# Patient Record
Sex: Female | Born: 1945
Health system: Southern US, Community
[De-identification: ages and names within clinical notes are randomized; demographics above are authoritative.]

## PROBLEM LIST (undated history)

## (undated) ENCOUNTER — Emergency Department (HOSPITAL_COMMUNITY): Discharge status: Against Medical Advice | Payer: Medicare Other

## (undated) DIAGNOSIS — Z8719 Personal history of other diseases of the digestive system: Secondary | ICD-10-CM

## (undated) DIAGNOSIS — G4730 Sleep apnea, unspecified: Secondary | ICD-10-CM

## (undated) DIAGNOSIS — M792 Neuralgia and neuritis, unspecified: Secondary | ICD-10-CM

## (undated) DIAGNOSIS — Z9289 Personal history of other medical treatment: Secondary | ICD-10-CM

## (undated) DIAGNOSIS — R51 Headache: Secondary | ICD-10-CM

## (undated) DIAGNOSIS — F32A Depression, unspecified: Secondary | ICD-10-CM

## (undated) DIAGNOSIS — N1831 Chronic kidney disease, stage 3a: Secondary | ICD-10-CM

## (undated) DIAGNOSIS — F419 Anxiety disorder, unspecified: Secondary | ICD-10-CM

## (undated) DIAGNOSIS — F329 Major depressive disorder, single episode, unspecified: Secondary | ICD-10-CM

## (undated) DIAGNOSIS — Z87442 Personal history of urinary calculi: Secondary | ICD-10-CM

## (undated) DIAGNOSIS — K219 Gastro-esophageal reflux disease without esophagitis: Secondary | ICD-10-CM

## (undated) DIAGNOSIS — N200 Calculus of kidney: Secondary | ICD-10-CM

## (undated) DIAGNOSIS — M1990 Unspecified osteoarthritis, unspecified site: Secondary | ICD-10-CM

## (undated) DIAGNOSIS — D649 Anemia, unspecified: Secondary | ICD-10-CM

## (undated) DIAGNOSIS — Z9889 Other specified postprocedural states: Secondary | ICD-10-CM

## (undated) DIAGNOSIS — F429 Obsessive-compulsive disorder, unspecified: Secondary | ICD-10-CM

## (undated) DIAGNOSIS — I4891 Unspecified atrial fibrillation: Secondary | ICD-10-CM

## (undated) DIAGNOSIS — R0602 Shortness of breath: Secondary | ICD-10-CM

## (undated) DIAGNOSIS — E610 Copper deficiency: Principal | ICD-10-CM

## (undated) DIAGNOSIS — E039 Hypothyroidism, unspecified: Secondary | ICD-10-CM

## (undated) DIAGNOSIS — N189 Chronic kidney disease, unspecified: Secondary | ICD-10-CM

## (undated) DIAGNOSIS — J069 Acute upper respiratory infection, unspecified: Secondary | ICD-10-CM

## (undated) DIAGNOSIS — R112 Nausea with vomiting, unspecified: Secondary | ICD-10-CM

## (undated) DIAGNOSIS — E079 Disorder of thyroid, unspecified: Secondary | ICD-10-CM

## (undated) DIAGNOSIS — I4819 Other persistent atrial fibrillation: Secondary | ICD-10-CM

## (undated) DIAGNOSIS — M549 Dorsalgia, unspecified: Secondary | ICD-10-CM

## (undated) DIAGNOSIS — G8929 Other chronic pain: Secondary | ICD-10-CM

## (undated) DIAGNOSIS — K449 Diaphragmatic hernia without obstruction or gangrene: Secondary | ICD-10-CM

## (undated) DIAGNOSIS — I340 Nonrheumatic mitral (valve) insufficiency: Secondary | ICD-10-CM

## (undated) DIAGNOSIS — Z5189 Encounter for other specified aftercare: Secondary | ICD-10-CM

## (undated) HISTORY — PX: TONSILLECTOMY: SUR1361

## (undated) HISTORY — DX: Disorder of thyroid, unspecified: E07.9

## (undated) HISTORY — DX: Copper deficiency: E61.0

## (undated) HISTORY — DX: Other persistent atrial fibrillation: I48.19

## (undated) HISTORY — PX: ABDOMINAL HYSTERECTOMY: SHX81

## (undated) HISTORY — DX: Chronic kidney disease, stage 3a: N18.31

## (undated) HISTORY — PX: PERIPHERALLY INSERTED CENTRAL CATHETER INSERTION: SHX2221

## (undated) HISTORY — DX: Major depressive disorder, single episode, unspecified: F32.9

## (undated) HISTORY — DX: Gastro-esophageal reflux disease without esophagitis: K21.9

## (undated) HISTORY — DX: Unspecified atrial fibrillation: I48.91

## (undated) HISTORY — DX: Depression, unspecified: F32.A

## (undated) HISTORY — PX: APPENDECTOMY: SHX54

## (undated) HISTORY — PX: EYE SURGERY: SHX253

## (undated) HISTORY — PX: JOINT REPLACEMENT: SHX530

## (undated) HISTORY — DX: Obsessive-compulsive disorder, unspecified: F42.9

## (undated) HISTORY — DX: Morbid (severe) obesity due to excess calories: E66.01

## (undated) HISTORY — DX: Chronic kidney disease, unspecified: N18.9

## (undated) HISTORY — DX: Nonrheumatic mitral (valve) insufficiency: I34.0

## (undated) HISTORY — PX: PARATHYROIDECTOMY: SHX19

## (undated) HISTORY — DX: Anxiety disorder, unspecified: F41.9

## (undated) HISTORY — PX: CHOLECYSTECTOMY: SHX55

---

## 1998-01-21 ENCOUNTER — Other Ambulatory Visit: Admission: RE | Admit: 1998-01-21 | Discharge: 1998-01-21 | Payer: Self-pay | Admitting: Obstetrics and Gynecology

## 2001-09-12 ENCOUNTER — Encounter: Payer: Self-pay | Admitting: Cardiology

## 2001-09-12 ENCOUNTER — Inpatient Hospital Stay (HOSPITAL_COMMUNITY): Admission: EM | Admit: 2001-09-12 | Discharge: 2001-09-14 | Payer: Self-pay | Admitting: *Deleted

## 2001-09-12 ENCOUNTER — Encounter (INDEPENDENT_AMBULATORY_CARE_PROVIDER_SITE_OTHER): Payer: Self-pay | Admitting: Specialist

## 2001-10-24 ENCOUNTER — Encounter: Payer: Self-pay | Admitting: Internal Medicine

## 2001-10-24 ENCOUNTER — Ambulatory Visit (HOSPITAL_COMMUNITY): Admission: RE | Admit: 2001-10-24 | Discharge: 2001-10-24 | Payer: Self-pay | Admitting: Internal Medicine

## 2002-04-03 ENCOUNTER — Ambulatory Visit: Admission: RE | Admit: 2002-04-03 | Discharge: 2002-04-03 | Payer: Self-pay | Admitting: Family Medicine

## 2002-04-12 ENCOUNTER — Ambulatory Visit (HOSPITAL_COMMUNITY): Admission: RE | Admit: 2002-04-12 | Discharge: 2002-04-12 | Payer: Self-pay | Admitting: Internal Medicine

## 2003-01-08 ENCOUNTER — Ambulatory Visit: Admission: RE | Admit: 2003-01-08 | Discharge: 2003-01-08 | Payer: Self-pay | Admitting: Pulmonary Disease

## 2003-11-27 ENCOUNTER — Encounter: Admission: RE | Admit: 2003-11-27 | Discharge: 2003-11-27 | Payer: Self-pay | Admitting: Family Medicine

## 2004-01-24 ENCOUNTER — Ambulatory Visit: Payer: Self-pay | Admitting: Psychology

## 2004-02-03 ENCOUNTER — Ambulatory Visit: Payer: Self-pay | Admitting: Psychology

## 2004-02-13 ENCOUNTER — Ambulatory Visit (HOSPITAL_COMMUNITY): Admission: RE | Admit: 2004-02-13 | Discharge: 2004-02-13 | Payer: Self-pay | Admitting: Family Medicine

## 2004-03-09 ENCOUNTER — Ambulatory Visit: Payer: Self-pay | Admitting: Internal Medicine

## 2004-03-18 ENCOUNTER — Ambulatory Visit (HOSPITAL_COMMUNITY): Admission: RE | Admit: 2004-03-18 | Discharge: 2004-03-18 | Payer: Self-pay | Admitting: Internal Medicine

## 2004-03-18 ENCOUNTER — Ambulatory Visit: Payer: Self-pay | Admitting: Internal Medicine

## 2004-04-15 ENCOUNTER — Ambulatory Visit: Payer: Self-pay | Admitting: Internal Medicine

## 2004-04-27 ENCOUNTER — Ambulatory Visit: Payer: Self-pay | Admitting: *Deleted

## 2004-04-29 ENCOUNTER — Ambulatory Visit: Payer: Self-pay | Admitting: *Deleted

## 2004-04-29 ENCOUNTER — Encounter (HOSPITAL_COMMUNITY): Admission: RE | Admit: 2004-04-29 | Discharge: 2004-04-30 | Payer: Self-pay | Admitting: *Deleted

## 2004-05-14 ENCOUNTER — Ambulatory Visit (HOSPITAL_COMMUNITY): Admission: RE | Admit: 2004-05-14 | Discharge: 2004-05-14 | Payer: Self-pay | Admitting: Family Medicine

## 2004-05-27 ENCOUNTER — Ambulatory Visit: Payer: Self-pay | Admitting: *Deleted

## 2004-06-30 ENCOUNTER — Ambulatory Visit: Payer: Self-pay | Admitting: *Deleted

## 2004-08-04 ENCOUNTER — Ambulatory Visit (HOSPITAL_COMMUNITY): Admission: RE | Admit: 2004-08-04 | Discharge: 2004-08-04 | Payer: Self-pay | Admitting: Family Medicine

## 2004-08-11 ENCOUNTER — Ambulatory Visit: Payer: Self-pay | Admitting: *Deleted

## 2004-08-19 ENCOUNTER — Ambulatory Visit (HOSPITAL_COMMUNITY): Admission: RE | Admit: 2004-08-19 | Discharge: 2004-08-19 | Payer: Self-pay | Admitting: Family Medicine

## 2005-01-18 HISTORY — PX: GASTRIC BYPASS: SHX52

## 2005-03-08 ENCOUNTER — Ambulatory Visit (HOSPITAL_COMMUNITY): Admission: RE | Admit: 2005-03-08 | Discharge: 2005-03-08 | Payer: Self-pay | Admitting: Family Medicine

## 2005-05-12 ENCOUNTER — Ambulatory Visit (HOSPITAL_COMMUNITY): Admission: RE | Admit: 2005-05-12 | Discharge: 2005-05-12 | Payer: Self-pay | Admitting: Family Medicine

## 2005-08-05 ENCOUNTER — Ambulatory Visit: Payer: Self-pay | Admitting: Orthopedic Surgery

## 2005-09-14 ENCOUNTER — Ambulatory Visit (HOSPITAL_COMMUNITY): Admission: RE | Admit: 2005-09-14 | Discharge: 2005-09-14 | Payer: Self-pay | Admitting: Family Medicine

## 2005-10-05 ENCOUNTER — Emergency Department (HOSPITAL_COMMUNITY): Admission: EM | Admit: 2005-10-05 | Discharge: 2005-10-05 | Payer: Self-pay | Admitting: Emergency Medicine

## 2005-10-11 ENCOUNTER — Ambulatory Visit: Payer: Self-pay | Admitting: Internal Medicine

## 2005-12-14 ENCOUNTER — Ambulatory Visit (HOSPITAL_COMMUNITY): Payer: Self-pay | Admitting: Psychology

## 2005-12-24 ENCOUNTER — Ambulatory Visit (HOSPITAL_COMMUNITY): Payer: Self-pay | Admitting: Psychology

## 2006-01-07 ENCOUNTER — Ambulatory Visit (HOSPITAL_COMMUNITY): Payer: Self-pay | Admitting: Psychology

## 2006-01-18 HISTORY — PX: GASTRIC BYPASS: SHX52

## 2006-01-24 ENCOUNTER — Ambulatory Visit (HOSPITAL_COMMUNITY): Payer: Self-pay | Admitting: Psychology

## 2006-02-02 ENCOUNTER — Ambulatory Visit (HOSPITAL_COMMUNITY): Admission: RE | Admit: 2006-02-02 | Discharge: 2006-02-02 | Payer: Self-pay | Admitting: Family Medicine

## 2006-02-10 ENCOUNTER — Ambulatory Visit (HOSPITAL_COMMUNITY): Payer: Self-pay | Admitting: Psychology

## 2006-02-24 ENCOUNTER — Ambulatory Visit (HOSPITAL_COMMUNITY): Payer: Self-pay | Admitting: Psychology

## 2006-02-28 ENCOUNTER — Ambulatory Visit (HOSPITAL_COMMUNITY): Admission: RE | Admit: 2006-02-28 | Discharge: 2006-02-28 | Payer: Self-pay | Admitting: Orthopedic Surgery

## 2006-03-10 ENCOUNTER — Ambulatory Visit (HOSPITAL_COMMUNITY): Payer: Self-pay | Admitting: Psychology

## 2006-03-25 ENCOUNTER — Ambulatory Visit (HOSPITAL_COMMUNITY): Payer: Self-pay | Admitting: Psychology

## 2006-04-08 ENCOUNTER — Ambulatory Visit (HOSPITAL_COMMUNITY): Payer: Self-pay | Admitting: Psychology

## 2006-04-25 ENCOUNTER — Ambulatory Visit (HOSPITAL_COMMUNITY): Payer: Self-pay | Admitting: Psychology

## 2006-05-09 ENCOUNTER — Ambulatory Visit (HOSPITAL_COMMUNITY): Payer: Self-pay | Admitting: Psychology

## 2006-05-25 ENCOUNTER — Ambulatory Visit (HOSPITAL_COMMUNITY): Payer: Self-pay | Admitting: Psychology

## 2006-06-08 ENCOUNTER — Ambulatory Visit (HOSPITAL_COMMUNITY): Payer: Self-pay | Admitting: Psychology

## 2006-07-08 ENCOUNTER — Ambulatory Visit (HOSPITAL_COMMUNITY): Payer: Self-pay | Admitting: Psychology

## 2006-08-05 ENCOUNTER — Ambulatory Visit (HOSPITAL_COMMUNITY): Payer: Self-pay | Admitting: Psychology

## 2006-09-05 ENCOUNTER — Ambulatory Visit (HOSPITAL_COMMUNITY): Payer: Self-pay | Admitting: Psychology

## 2006-11-30 ENCOUNTER — Ambulatory Visit (HOSPITAL_COMMUNITY): Payer: Self-pay | Admitting: Psychology

## 2007-04-20 ENCOUNTER — Encounter (HOSPITAL_COMMUNITY): Admission: RE | Admit: 2007-04-20 | Discharge: 2007-05-20 | Payer: Self-pay | Admitting: Family Medicine

## 2007-06-22 ENCOUNTER — Ambulatory Visit: Payer: Self-pay | Admitting: Internal Medicine

## 2007-06-23 ENCOUNTER — Ambulatory Visit (HOSPITAL_COMMUNITY): Admission: RE | Admit: 2007-06-23 | Discharge: 2007-06-23 | Payer: Self-pay | Admitting: Internal Medicine

## 2007-06-29 ENCOUNTER — Ambulatory Visit: Admission: RE | Admit: 2007-06-29 | Discharge: 2007-06-29 | Payer: Self-pay | Admitting: Family Medicine

## 2007-12-26 ENCOUNTER — Inpatient Hospital Stay (HOSPITAL_COMMUNITY): Admission: AD | Admit: 2007-12-26 | Discharge: 2008-01-01 | Payer: Self-pay | Admitting: Family Medicine

## 2008-02-29 ENCOUNTER — Ambulatory Visit (HOSPITAL_COMMUNITY): Admission: RE | Admit: 2008-02-29 | Discharge: 2008-02-29 | Payer: Self-pay | Admitting: Internal Medicine

## 2008-02-29 ENCOUNTER — Encounter: Payer: Self-pay | Admitting: Urgent Care

## 2008-02-29 ENCOUNTER — Ambulatory Visit: Payer: Self-pay | Admitting: Internal Medicine

## 2008-02-29 LAB — CONVERTED CEMR LAB
ALT: 11 units/L (ref 0–35)
AST: 16 units/L (ref 0–37)
Alkaline Phosphatase: 72 units/L (ref 39–117)
Basophils Absolute: 0 10*3/uL (ref 0.0–0.1)
Basophils Relative: 0 % (ref 0–1)
Bilirubin, Direct: 0.1 mg/dL (ref 0.0–0.3)
CO2: 30 meq/L (ref 19–32)
Chloride: 101 meq/L (ref 96–112)
Hemoglobin: 12.4 g/dL (ref 12.0–15.0)
Indirect Bilirubin: 0.2 mg/dL (ref 0.0–0.9)
Lymphocytes Relative: 30 % (ref 12–46)
MCHC: 33.6 g/dL (ref 30.0–36.0)
Monocytes Absolute: 0.6 10*3/uL (ref 0.1–1.0)
Neutro Abs: 5.2 10*3/uL (ref 1.7–7.7)
Neutrophils Relative %: 60 % (ref 43–77)
Potassium: 4.6 meq/L (ref 3.5–5.3)
RDW: 14.8 % (ref 11.5–15.5)
Sodium: 139 meq/L (ref 135–145)
Total Protein: 6.3 g/dL (ref 6.0–8.3)

## 2008-03-06 ENCOUNTER — Telehealth: Payer: Self-pay | Admitting: Urgent Care

## 2008-03-06 ENCOUNTER — Ambulatory Visit: Payer: Self-pay | Admitting: Internal Medicine

## 2008-05-28 ENCOUNTER — Ambulatory Visit (HOSPITAL_COMMUNITY): Admission: RE | Admit: 2008-05-28 | Discharge: 2008-05-28 | Payer: Self-pay | Admitting: Family Medicine

## 2008-06-06 ENCOUNTER — Encounter (INDEPENDENT_AMBULATORY_CARE_PROVIDER_SITE_OTHER): Payer: Self-pay | Admitting: General Surgery

## 2008-06-06 ENCOUNTER — Ambulatory Visit (HOSPITAL_COMMUNITY): Admission: RE | Admit: 2008-06-06 | Discharge: 2008-06-07 | Payer: Self-pay | Admitting: General Surgery

## 2008-07-25 ENCOUNTER — Emergency Department (HOSPITAL_COMMUNITY): Admission: EM | Admit: 2008-07-25 | Discharge: 2008-07-26 | Payer: Self-pay | Admitting: Emergency Medicine

## 2008-08-15 ENCOUNTER — Ambulatory Visit (HOSPITAL_COMMUNITY): Admission: RE | Admit: 2008-08-15 | Discharge: 2008-08-15 | Payer: Self-pay | Admitting: Urology

## 2008-08-21 ENCOUNTER — Encounter: Payer: Self-pay | Admitting: Gastroenterology

## 2008-08-29 ENCOUNTER — Ambulatory Visit (HOSPITAL_COMMUNITY): Payer: Self-pay | Admitting: Psychology

## 2008-09-27 ENCOUNTER — Ambulatory Visit (HOSPITAL_COMMUNITY): Payer: Self-pay | Admitting: Psychology

## 2008-11-06 ENCOUNTER — Ambulatory Visit (HOSPITAL_COMMUNITY): Payer: Self-pay | Admitting: Psychology

## 2009-01-16 ENCOUNTER — Ambulatory Visit (HOSPITAL_COMMUNITY): Admission: RE | Admit: 2009-01-16 | Discharge: 2009-01-16 | Payer: Self-pay | Admitting: Family Medicine

## 2009-01-16 ENCOUNTER — Encounter (INDEPENDENT_AMBULATORY_CARE_PROVIDER_SITE_OTHER): Payer: Self-pay | Admitting: *Deleted

## 2009-01-16 LAB — CONVERTED CEMR LAB
Basophils Absolute: 0 10*3/uL
Basophils Relative: 1 %
Eosinophils Absolute: 0.2 10*3/uL
Eosinophils Relative: 3 %
Lymphocytes Relative: 42 %
Lymphs Abs: 2.5 10*3/uL
MCV: 82.5 fL
Neutro Abs: 2.8 10*3/uL
RBC: 4.18 M/uL
WBC: 5.8 10*3/uL

## 2009-02-01 ENCOUNTER — Inpatient Hospital Stay (HOSPITAL_COMMUNITY): Admission: EM | Admit: 2009-02-01 | Discharge: 2009-02-06 | Payer: Self-pay | Admitting: Emergency Medicine

## 2009-02-02 ENCOUNTER — Ambulatory Visit: Payer: Self-pay | Admitting: Internal Medicine

## 2009-02-03 ENCOUNTER — Ambulatory Visit: Payer: Self-pay | Admitting: Internal Medicine

## 2009-02-04 ENCOUNTER — Ambulatory Visit: Payer: Self-pay | Admitting: Internal Medicine

## 2009-02-05 ENCOUNTER — Ambulatory Visit: Payer: Self-pay | Admitting: Internal Medicine

## 2009-03-10 ENCOUNTER — Ambulatory Visit (HOSPITAL_COMMUNITY): Admission: RE | Admit: 2009-03-10 | Discharge: 2009-03-10 | Payer: Self-pay | Admitting: Urology

## 2009-03-19 DIAGNOSIS — D351 Benign neoplasm of parathyroid gland: Secondary | ICD-10-CM | POA: Insufficient documentation

## 2009-03-19 DIAGNOSIS — F341 Dysthymic disorder: Secondary | ICD-10-CM | POA: Insufficient documentation

## 2009-03-19 DIAGNOSIS — E7800 Pure hypercholesterolemia, unspecified: Secondary | ICD-10-CM | POA: Insufficient documentation

## 2009-03-19 DIAGNOSIS — M1990 Unspecified osteoarthritis, unspecified site: Secondary | ICD-10-CM | POA: Insufficient documentation

## 2009-03-19 DIAGNOSIS — E538 Deficiency of other specified B group vitamins: Secondary | ICD-10-CM | POA: Insufficient documentation

## 2009-03-19 DIAGNOSIS — I10 Essential (primary) hypertension: Secondary | ICD-10-CM | POA: Insufficient documentation

## 2009-03-19 DIAGNOSIS — T8149XA Infection following a procedure, other surgical site, initial encounter: Secondary | ICD-10-CM

## 2009-03-19 DIAGNOSIS — K219 Gastro-esophageal reflux disease without esophagitis: Secondary | ICD-10-CM | POA: Insufficient documentation

## 2009-03-20 ENCOUNTER — Ambulatory Visit: Payer: Self-pay | Admitting: Internal Medicine

## 2009-03-20 ENCOUNTER — Encounter (INDEPENDENT_AMBULATORY_CARE_PROVIDER_SITE_OTHER): Payer: Self-pay

## 2009-03-20 DIAGNOSIS — Z8711 Personal history of peptic ulcer disease: Secondary | ICD-10-CM

## 2009-03-25 ENCOUNTER — Encounter: Payer: Self-pay | Admitting: Internal Medicine

## 2009-03-25 LAB — CONVERTED CEMR LAB
ALT: <8 units/L (ref 0–35)
Alkaline Phosphatase: 68 units/L (ref 39–117)
Basophils Absolute: 0 10*3/uL (ref 0.0–0.1)
Basophils Relative: 1 % (ref 0–1)
CO2: 23 meq/L (ref 19–32)
Creatinine, Ser: 0.85 mg/dL (ref 0.40–1.20)
Eosinophils Absolute: 0.1 10*3/uL (ref 0.0–0.7)
Glucose, Bld: 159 mg/dL — ABNORMAL HIGH (ref 70–99)
Lipase: <10 units/L (ref 0–75)
MCHC: 30.8 g/dL (ref 30.0–36.0)
MCV: 87.6 fL (ref 78.0–100.0)
Monocytes Relative: 6 % (ref 3–12)
Neutro Abs: 2.8 10*3/uL (ref 1.7–7.7)
Neutrophils Relative %: 47 % (ref 43–77)
RBC: 3.56 M/uL — ABNORMAL LOW (ref 3.87–5.11)
RDW: 14.7 % (ref 11.5–15.5)
Total Bilirubin: 0.6 mg/dL (ref 0.3–1.2)

## 2009-03-26 ENCOUNTER — Encounter: Payer: Self-pay | Admitting: Internal Medicine

## 2009-03-26 LAB — CONVERTED CEMR LAB
Iron: 21 ug/dL — ABNORMAL LOW (ref 42–145)
Saturation Ratios: 5 % — ABNORMAL LOW (ref 20–55)
TIBC: 382 ug/dL (ref 250–470)
Vitamin B-12: 330 pg/mL (ref 211–911)

## 2009-03-27 ENCOUNTER — Ambulatory Visit: Payer: Self-pay | Admitting: Internal Medicine

## 2009-03-31 ENCOUNTER — Encounter: Payer: Self-pay | Admitting: Internal Medicine

## 2009-04-07 ENCOUNTER — Ambulatory Visit (HOSPITAL_COMMUNITY): Admission: RE | Admit: 2009-04-07 | Discharge: 2009-04-07 | Payer: Self-pay | Admitting: Internal Medicine

## 2009-04-07 ENCOUNTER — Ambulatory Visit: Payer: Self-pay | Admitting: Internal Medicine

## 2009-04-09 ENCOUNTER — Encounter: Payer: Self-pay | Admitting: Internal Medicine

## 2009-05-07 ENCOUNTER — Encounter (INDEPENDENT_AMBULATORY_CARE_PROVIDER_SITE_OTHER): Payer: Self-pay

## 2009-05-19 ENCOUNTER — Encounter: Payer: Self-pay | Admitting: Urgent Care

## 2009-05-20 ENCOUNTER — Ambulatory Visit: Payer: Self-pay | Admitting: Internal Medicine

## 2009-05-20 DIAGNOSIS — R109 Unspecified abdominal pain: Secondary | ICD-10-CM

## 2009-05-20 DIAGNOSIS — R1084 Generalized abdominal pain: Secondary | ICD-10-CM | POA: Insufficient documentation

## 2009-06-04 ENCOUNTER — Encounter (INDEPENDENT_AMBULATORY_CARE_PROVIDER_SITE_OTHER): Payer: Self-pay

## 2009-06-04 ENCOUNTER — Ambulatory Visit: Payer: Self-pay | Admitting: Internal Medicine

## 2009-06-04 ENCOUNTER — Ambulatory Visit (HOSPITAL_COMMUNITY): Admission: RE | Admit: 2009-06-04 | Discharge: 2009-06-04 | Payer: Self-pay | Admitting: Internal Medicine

## 2009-06-06 ENCOUNTER — Encounter: Payer: Self-pay | Admitting: Gastroenterology

## 2009-06-06 ENCOUNTER — Encounter: Payer: Self-pay | Admitting: Internal Medicine

## 2009-06-06 ENCOUNTER — Ambulatory Visit (HOSPITAL_COMMUNITY): Admission: RE | Admit: 2009-06-06 | Discharge: 2009-06-06 | Payer: Self-pay | Admitting: Internal Medicine

## 2009-06-06 LAB — CONVERTED CEMR LAB
Basophils Absolute: 0.1 10*3/uL (ref 0.0–0.1)
Basophils Relative: 1 % (ref 0–1)
Eosinophils Absolute: 0.1 10*3/uL (ref 0.0–0.7)
Eosinophils Relative: 3 % (ref 0–5)
HCT: 34 % — ABNORMAL LOW (ref 36.0–46.0)
MCV: 83.5 fL (ref 78.0–100.0)
Platelets: 201 10*3/uL (ref 150–400)
RDW: 18.6 % — ABNORMAL HIGH (ref 11.5–15.5)

## 2009-06-10 ENCOUNTER — Encounter: Payer: Self-pay | Admitting: Internal Medicine

## 2009-06-12 ENCOUNTER — Ambulatory Visit (HOSPITAL_COMMUNITY): Admission: RE | Admit: 2009-06-12 | Discharge: 2009-06-12 | Payer: Self-pay | Admitting: Internal Medicine

## 2009-06-17 ENCOUNTER — Ambulatory Visit (HOSPITAL_COMMUNITY): Admission: RE | Admit: 2009-06-17 | Discharge: 2009-06-17 | Payer: Self-pay | Admitting: Urology

## 2009-06-19 ENCOUNTER — Ambulatory Visit: Payer: Self-pay | Admitting: Internal Medicine

## 2009-06-23 ENCOUNTER — Encounter: Payer: Self-pay | Admitting: Internal Medicine

## 2009-06-24 ENCOUNTER — Ambulatory Visit (HOSPITAL_COMMUNITY): Admission: RE | Admit: 2009-06-24 | Discharge: 2009-06-24 | Payer: Self-pay | Admitting: Urology

## 2009-06-25 ENCOUNTER — Emergency Department (HOSPITAL_COMMUNITY): Admission: EM | Admit: 2009-06-25 | Discharge: 2009-06-25 | Payer: Self-pay | Admitting: Emergency Medicine

## 2009-06-26 ENCOUNTER — Ambulatory Visit (HOSPITAL_COMMUNITY): Admission: RE | Admit: 2009-06-26 | Discharge: 2009-06-26 | Payer: Self-pay | Admitting: Internal Medicine

## 2009-06-27 ENCOUNTER — Ambulatory Visit (HOSPITAL_COMMUNITY): Admission: RE | Admit: 2009-06-27 | Discharge: 2009-06-27 | Payer: Self-pay | Admitting: Internal Medicine

## 2009-06-27 ENCOUNTER — Encounter: Payer: Self-pay | Admitting: Internal Medicine

## 2009-07-02 ENCOUNTER — Ambulatory Visit (HOSPITAL_COMMUNITY): Admission: RE | Admit: 2009-07-02 | Discharge: 2009-07-02 | Payer: Self-pay | Admitting: Internal Medicine

## 2009-07-04 ENCOUNTER — Encounter (INDEPENDENT_AMBULATORY_CARE_PROVIDER_SITE_OTHER): Payer: Self-pay

## 2009-07-09 ENCOUNTER — Telehealth (INDEPENDENT_AMBULATORY_CARE_PROVIDER_SITE_OTHER): Payer: Self-pay

## 2009-07-24 LAB — CONVERTED CEMR LAB
Basophils Absolute: 0 10*3/uL (ref 0.0–0.1)
Basophils Relative: 1 % (ref 0–1)
MCHC: 31.1 g/dL (ref 30.0–36.0)
Monocytes Absolute: 0.5 10*3/uL (ref 0.1–1.0)
Neutro Abs: 3 10*3/uL (ref 1.7–7.7)
Neutrophils Relative %: 48 % (ref 43–77)
Platelets: 172 10*3/uL (ref 150–400)
RDW: 18.7 % — ABNORMAL HIGH (ref 11.5–15.5)

## 2009-08-11 ENCOUNTER — Ambulatory Visit: Payer: Self-pay | Admitting: Gastroenterology

## 2009-08-11 DIAGNOSIS — K921 Melena: Secondary | ICD-10-CM | POA: Insufficient documentation

## 2009-08-12 ENCOUNTER — Encounter (INDEPENDENT_AMBULATORY_CARE_PROVIDER_SITE_OTHER): Payer: Self-pay

## 2009-08-12 ENCOUNTER — Encounter: Payer: Self-pay | Admitting: Gastroenterology

## 2009-08-13 ENCOUNTER — Telehealth (INDEPENDENT_AMBULATORY_CARE_PROVIDER_SITE_OTHER): Payer: Self-pay

## 2009-08-15 ENCOUNTER — Telehealth (INDEPENDENT_AMBULATORY_CARE_PROVIDER_SITE_OTHER): Payer: Self-pay

## 2009-08-15 ENCOUNTER — Encounter: Payer: Self-pay | Admitting: Internal Medicine

## 2009-08-18 ENCOUNTER — Ambulatory Visit (HOSPITAL_COMMUNITY): Admission: RE | Admit: 2009-08-18 | Discharge: 2009-08-18 | Payer: Self-pay | Admitting: Internal Medicine

## 2009-08-19 ENCOUNTER — Ambulatory Visit (HOSPITAL_COMMUNITY): Admission: RE | Admit: 2009-08-19 | Discharge: 2009-08-19 | Payer: Self-pay | Admitting: Internal Medicine

## 2009-08-22 ENCOUNTER — Telehealth (INDEPENDENT_AMBULATORY_CARE_PROVIDER_SITE_OTHER): Payer: Self-pay

## 2009-08-22 LAB — CONVERTED CEMR LAB
Basophils Relative: 1 % (ref 0–1)
Eosinophils Absolute: 0.1 10*3/uL (ref 0.0–0.7)
Lymphs Abs: 2.3 10*3/uL (ref 0.7–4.0)
MCV: 86.3 fL (ref 78.0–100.0)
Neutrophils Relative %: 55 % (ref 43–77)
Platelets: 193 10*3/uL (ref 150–400)
WBC: 6.8 10*3/uL (ref 4.0–10.5)

## 2009-08-25 ENCOUNTER — Telehealth (INDEPENDENT_AMBULATORY_CARE_PROVIDER_SITE_OTHER): Payer: Self-pay

## 2009-09-08 ENCOUNTER — Encounter: Payer: Self-pay | Admitting: Internal Medicine

## 2009-09-10 ENCOUNTER — Telehealth (INDEPENDENT_AMBULATORY_CARE_PROVIDER_SITE_OTHER): Payer: Self-pay

## 2009-09-12 ENCOUNTER — Ambulatory Visit (HOSPITAL_COMMUNITY): Admission: RE | Admit: 2009-09-12 | Discharge: 2009-09-12 | Payer: Self-pay | Admitting: General Surgery

## 2009-10-03 ENCOUNTER — Telehealth (INDEPENDENT_AMBULATORY_CARE_PROVIDER_SITE_OTHER): Payer: Self-pay

## 2009-10-03 ENCOUNTER — Encounter (INDEPENDENT_AMBULATORY_CARE_PROVIDER_SITE_OTHER): Payer: Self-pay

## 2009-10-15 ENCOUNTER — Ambulatory Visit (HOSPITAL_COMMUNITY)
Admission: RE | Admit: 2009-10-15 | Discharge: 2009-10-15 | Payer: Self-pay | Source: Home / Self Care | Admitting: Internal Medicine

## 2009-10-21 ENCOUNTER — Telehealth (INDEPENDENT_AMBULATORY_CARE_PROVIDER_SITE_OTHER): Payer: Self-pay

## 2009-10-23 ENCOUNTER — Ambulatory Visit (HOSPITAL_COMMUNITY): Admission: RE | Admit: 2009-10-23 | Discharge: 2009-10-23 | Payer: Self-pay | Admitting: Urology

## 2009-10-23 ENCOUNTER — Ambulatory Visit (HOSPITAL_COMMUNITY)
Admission: RE | Admit: 2009-10-23 | Discharge: 2009-10-23 | Payer: Self-pay | Source: Home / Self Care | Admitting: Urology

## 2009-10-29 ENCOUNTER — Ambulatory Visit: Payer: Self-pay | Admitting: Internal Medicine

## 2009-11-14 ENCOUNTER — Encounter (INDEPENDENT_AMBULATORY_CARE_PROVIDER_SITE_OTHER): Payer: Self-pay | Admitting: *Deleted

## 2009-11-21 ENCOUNTER — Encounter: Payer: Self-pay | Admitting: Gastroenterology

## 2009-12-31 ENCOUNTER — Ambulatory Visit (HOSPITAL_COMMUNITY)
Admission: RE | Admit: 2009-12-31 | Discharge: 2009-12-31 | Payer: Self-pay | Source: Home / Self Care | Attending: Urology | Admitting: Urology

## 2010-01-05 ENCOUNTER — Ambulatory Visit (HOSPITAL_COMMUNITY)
Admission: RE | Admit: 2010-01-05 | Discharge: 2010-01-05 | Payer: Self-pay | Source: Home / Self Care | Attending: Urology | Admitting: Urology

## 2010-01-13 ENCOUNTER — Ambulatory Visit (HOSPITAL_COMMUNITY): Admission: RE | Admit: 2010-01-13 | Payer: Self-pay | Source: Home / Self Care | Admitting: Urology

## 2010-01-14 ENCOUNTER — Encounter (INDEPENDENT_AMBULATORY_CARE_PROVIDER_SITE_OTHER): Payer: Self-pay | Admitting: *Deleted

## 2010-01-16 ENCOUNTER — Inpatient Hospital Stay (HOSPITAL_COMMUNITY)
Admission: EM | Admit: 2010-01-16 | Discharge: 2010-01-17 | Payer: Self-pay | Source: Home / Self Care | Attending: Family Medicine | Admitting: Family Medicine

## 2010-01-22 ENCOUNTER — Ambulatory Visit (HOSPITAL_COMMUNITY)
Admission: RE | Admit: 2010-01-22 | Discharge: 2010-01-22 | Payer: Self-pay | Source: Home / Self Care | Attending: Urology | Admitting: Urology

## 2010-01-27 DIAGNOSIS — N1330 Unspecified hydronephrosis: Secondary | ICD-10-CM | POA: Insufficient documentation

## 2010-01-28 ENCOUNTER — Ambulatory Visit (HOSPITAL_COMMUNITY)
Admission: RE | Admit: 2010-01-28 | Discharge: 2010-01-28 | Payer: Self-pay | Source: Home / Self Care | Attending: Urology | Admitting: Urology

## 2010-01-29 ENCOUNTER — Ambulatory Visit (HOSPITAL_COMMUNITY)
Admission: RE | Admit: 2010-01-29 | Discharge: 2010-01-29 | Payer: Self-pay | Source: Home / Self Care | Attending: Urology | Admitting: Urology

## 2010-01-29 ENCOUNTER — Ambulatory Visit (HOSPITAL_COMMUNITY)
Admission: RE | Admit: 2010-01-29 | Discharge: 2010-01-29 | Payer: Self-pay | Source: Home / Self Care | Attending: Cardiology | Admitting: Cardiology

## 2010-02-03 ENCOUNTER — Telehealth (INDEPENDENT_AMBULATORY_CARE_PROVIDER_SITE_OTHER): Payer: Self-pay | Admitting: *Deleted

## 2010-02-05 ENCOUNTER — Ambulatory Visit (HOSPITAL_COMMUNITY)
Admission: RE | Admit: 2010-02-05 | Discharge: 2010-02-05 | Payer: Self-pay | Source: Home / Self Care | Attending: Urology | Admitting: Urology

## 2010-02-05 ENCOUNTER — Encounter (INDEPENDENT_AMBULATORY_CARE_PROVIDER_SITE_OTHER): Payer: Self-pay | Admitting: *Deleted

## 2010-02-08 ENCOUNTER — Encounter: Payer: Self-pay | Admitting: Orthopedic Surgery

## 2010-02-08 ENCOUNTER — Encounter: Payer: Self-pay | Admitting: Family Medicine

## 2010-02-09 ENCOUNTER — Encounter (INDEPENDENT_AMBULATORY_CARE_PROVIDER_SITE_OTHER): Payer: Self-pay | Admitting: *Deleted

## 2010-02-09 NOTE — H&P (Signed)
  NAMEEZME, [name]                  ACCOUNT NO.:  [id number]  MEDICAL RECORD NO.:  [id number]           PATIENT TYPE:  LOCATION:                                 FACILITY:  PHYSICIAN:  Dennie Maizes, M.D.   DATE OF BIRTH:  06-07-1945  DATE OF ADMISSION:  01/28/2010 DATE OF DISCHARGE:  LH                             HISTORY & PHYSICAL   CHIEF COMPLAINT:  Severe right flank pain, right upper ureteral calculus with obstruction, right hydronephrosis.  HISTORY OF PRESENT ILLNESS:  This 65 year old female has been evaluated for hematuria in the past.  She had bilateral renal calculi.  She had a 3 to 4-mm sized stone in the left renal pelvis.  The patient underwent ESWL of the stone about a month ago.  She has passed all the stone fragments.  She had a 5 to 6-mm sized stone in the right kidney at that time.  She has been having intermittent right flank pain at present. She was evaluated at [facility] with CT scan of the abdomen and pelvis.  This revealed a moderately severe right hydronephrosis due to the obstructing 5-mm stone in the ureteropelvic junction area.  The patient has not passed the stone.  She continues to have severe pain.  She is brought to the short-stay center today for extracorporeal shock wave lithotripsy of the obstructing right upper ureteral calculus.  The patient denies fever, chills, voiding difficulty, or gross hematuria at present.  PAST MEDICAL HISTORY:  Status post gastric bypass surgery, cholecystectomy, hysterectomy, appendectomy, knee replacement, depression, anxiety, hypothyroidism, right renal lithiasis, status post ESWL of left renal calculus in December 2011.  MEDICATIONS:  Alprazolam 1 mg p.o. t.i.d., hydromorphone 2 mg p.o. t.i.d., Levoxyl 25 mg 1 p.o. daily, levothyroxine 0.25 mg p.o. daily, citalopram 10 mg p.o. daily.  ALLERGIES:  He is allergic to codeine.  REVIEW OF SYSTEMS:  Positive for abdominal pain, nausea, flank  pain.  FAMILY HISTORY:  Unremarkable.  SOCIAL HISTORY:  The patient is a nonsmoker and she does not drink.  PHYSICAL EXAMINATION:  HEAD, EYES, EARS, NOSE AND THROAT: Normal. LUNGS:  Clear to auscultation. HEART:  Regular rate and rhythm.  No murmurs. ABDOMEN:  Soft.  No palpable flank mass.  Moderate right costovertebral angle tenderness was noted.  Bladder not palpable.  IMPRESSION:  Right upper ureteral calculus with obstruction, right flank pain, right hydronephrosis.  PLAN:  I discussed with the patient regarding the diagnosis and management options.  She is scheduled to undergo extracorporeal shock wave lithotripsy of the right renal calculus with IV sedation at [facility].  I informed her regarding the diagnosis, options of treatment, operative details, outcome, possible risks, and complications and understands and has agreed for the procedure to be done.     Dennie Maizes, M.D.     SK/MEDQ  D:  01/27/2010  T:  01/27/2010  Job:  [id number]  cc:   Mila Homer. Sudie Bailey, M.D. Fax: [phone]  Dr. Cloyde Reams Signed by Dennie Maizes M.D. on 02/09/2010 09:40:27 AM

## 2010-02-16 ENCOUNTER — Encounter (INDEPENDENT_AMBULATORY_CARE_PROVIDER_SITE_OTHER): Payer: Self-pay | Admitting: *Deleted

## 2010-02-17 ENCOUNTER — Ambulatory Visit
Admission: RE | Admit: 2010-02-17 | Discharge: 2010-02-17 | Payer: Self-pay | Source: Home / Self Care | Attending: Cardiology | Admitting: Cardiology

## 2010-02-17 NOTE — Progress Notes (Signed)
Summary: refills on lidocaine  Phone Note Call from Patient Call back at Home Phone [phone]   Caller: Patient Summary of Call: pt called- needs refills on Lidocaine but wants to transfer rx to [facility]. Pt had it filled previously at [facility]. Initial call taken by: Hendricks Limes LPN,  August 22, 2009 10:26 AM     Appended Document: refills on lidocaine see separate rx note.

## 2010-02-17 NOTE — Assessment & Plan Note (Signed)
Summary: CRD/GU   Pt returned one IFOB and it was positive   Allergies: 1)  ! Codeine  Other Orders: Immuno-chemical Fecal Occult ([address])  Appended Document: CRD/GU last tcs 06; heme positive; may be related to ulcer - but needs to go ahead and get another tcs  Appended Document: CRD/GU Pt scheduled for tcs 04/07/09@10 :45am.Pt aware of appt.

## 2010-02-17 NOTE — Progress Notes (Signed)
Summary: Phone note/  n/v  Phone Note Call from Patient   Caller: Patient Summary of Call: Pt called and requested Rx for n/v. She said she had requested it last week, and got Vicous Lidocaine called in. I checked with pharmacist, they requested Zofran 8mg  which had been given by one of the [facility] doctors in the past. Pt said she has had n/v for the last 3-4 days. She is able to keep very little fluids down. Please advise! Initial call taken by: Cloria Spring LPN,  August 25, 2009 11:38 AM     Appended Document: Phone note/  n/v I gave RX for what Raynelle Fanning said pt asked for. See phone note last week. We did not get a request from the pharmacy for Zofran.   Will give RX for Zofran. If N/V persistent, then go to ED. Await for further input from RMR regarding Agile study.   Prescriptions: ONDANSETRON HCL 4 MG TABS (ONDANSETRON HCL) one by mouth every 4-6 hours as needed n/v  #20 x 0   Entered and Authorized by:   Leanna Battles. Dixon Boos   Signed by:   Leanna Battles Dixon Boos on 08/25/2009   Method used:   Electronically to        [facility] (retail)       [address]       [place], [place]  [address]       Ph: [phone]       Fax: [phone]   RxID:   [id number]     Appended Document: Phone note/  n/v Called, could not leave message.  Appended Document: Phone note/  n/v Pt informed.

## 2010-02-17 NOTE — Letter (Signed)
Summary: KUB ORDER  KUB ORDER   Imported By: Ave Filter 06/27/2009 16:14:09  _____________________________________________________________________  External Attachment:    Type:   Image     Comment:   External Document

## 2010-02-17 NOTE — Letter (Signed)
Summary: TCS ORDER  TCS ORDER   Imported By: Ave Filter 03/31/2009 10:50:45  _____________________________________________________________________  External Attachment:    Type:   Image     Comment:   External Document

## 2010-02-17 NOTE — Letter (Signed)
Summary: Unable to Reach, Consult Scheduled  [facility]  [address]   [place], [place] [address]   Phone: [phone]  Fax: [phone]    11/14/2009  [name] [address] [place], [place]  [address] 1945/11/26   Dear [name],   We have been unable to reach you by phone.  Please contact our office with an updated phone number.  We have been trying to reach you to inform you that the records and disk that you needed to pick up for your doctors visit is ready to be picked up. Please call or come by our office as soon as possible [phone], [address]    Thank you,    Rexene Alberts  [facility] R. Roetta Sessions, M.D.    Jonette Eva, M.D. [name], FNP-BC    Tana Coast, PA-C Phone: [phone]    Fax: [phone]

## 2010-02-17 NOTE — Letter (Signed)
Summary: Recall Colonoscopy/Endoscopy, Change to Office Visit  [facility]  [address]   [place], [place] [address]   Phone: [phone]  Fax: [phone]      May 07, 2009   [place] [address] [place], [place]  [address] 30-Dec-1945   Dear [name],   According to our records, it is time for you to schedule an Endoscopy. However, after reviewing your medical record, we recommend an office visit in order to determine your need for a repeat procedure.  Please call [phone] at your convenience to schedule an office visit. If you have any questions or concerns, please feel free to contact our office.   Sincerely,   Cloria Spring LPN  [facility] Ph: [phone]   Fax: [phone]  Appended Document: Recall Colonoscopy/Endoscopy, Change to Office Visit Pt came by office to make appt. for 5/3 @ 1030 w/LSL

## 2010-02-17 NOTE — Miscellaneous (Signed)
Summary: H. Pylori  01/2009  Clinical Lists Changes L-Helicobacter Pylori Antibody-IgG, Bld - STATUS: Final                                            Perform Date: 17Jan11 05:20  Ordered By: Jena Gauss MD , Gerrit Friends           Ordered Date: 17Jan11 15:59                                       Last Updated Date: 18Jan11 13:38  Facility: [facility]                               Department: GENL  Accession #: [id number]                  USN:       [id number]  Findings  Result Name                              Result     Abnl   Normal Range     Units      Perf. Loc.  Helicobacter Pylori Antibody, IgG        <0.4                               ISR    Oversized comment, see footnote  1  Footnotes  1. (NOTE)     No significant level of IgG antibody to H. pylori detected.     ISR = Immune Status Ratio     <= 0.90 ISR             Negative     0.91-1.09 ISR           Equivocal     >= 1.10 ISR             Positive     The above results were obtained with the Immulite 2000 H. pylori IgG     EIA.  Results obtained from other manufacturer's assay methods may not     be used interchangeably.     Performed at [facility]  Additional Information  HL7 RESULT STATUS : F  External IF Update Timestamp : 2009-02-04:13:35:00.000000

## 2010-02-17 NOTE — Letter (Signed)
Summary: Plan of Care, Need to Discuss  [facility]  [address]   [place], [place] [address]   Phone: [phone]  Fax: [phone]    Jun 04, 2009  [place] [address] [place], [place]  [address] 12/19/1945   Dear Ms. Freimark,   We are writing this letter to inform you of treatment plans and/or discuss your plan of care.  We have tried several times to contact you; however, we have yet to reach you.  We ask that you please contact our office for follow-up on your gastrointestinal issues.  We can  be reached at [phone] to schedule an appointment, or to speak with someone regarding your health care needs.  Please do not neglect your health.   Sincerely,    Cloria Spring LPN  [facility] Ph: [phone]    Fax: [phone]

## 2010-02-17 NOTE — Letter (Signed)
Summary: UGI/SBFT ORDER  UGI/SBFT ORDER   Imported By: Ave Filter 06/10/2009 11:19:25  _____________________________________________________________________  External Attachment:    Type:   Image     Comment:   External Document

## 2010-02-17 NOTE — Assessment & Plan Note (Signed)
Summary: consult for EGD,problems with her stomach/ss   Visit Type:  f/u Primary Care Provider:  Sudie [name]  Chief Complaint:  wants egd and abd pain.  History of Present Illness: [name] is a pleasant 65 y/o WF, patient of Dr. Sudie [name], who presents back today in follow-up. She has h/o of bariatric surgery with ugi bleed secondary to anastomotic ulcer. She is due for f/u EGD. She c/o ongoing abd pain. She has pain in upper abd as well as right mid-abdomen. Pain worse with any oral intake. She is unable to eat much. She states her weight is up a few pounds but she relates this to lower ext edema. Two weeks ago, she noted drainage from her incision, green pus. She started some Abx she had leftover at home. She has h/o MRSA from her incision before. She developed n/v last week. [place] has not been as bad with decreased oral intake. BM okay. No melena, brbpr. She takes Carafate three times a day. Nexium two times a day.      Current Medications (verified): 1)  Nexium 40 Mg Cpdr (Esomeprazole Magnesium) .... One By Mouth Bid 2)  Alprazolam 1 Mg Tabs (Alprazolam) .... Two Times A Day 3)  B12 Injection .... Monthly 4)  Celexa 20 Mg Tabs (Citalopram Hydrobromide) .... Take 1 Tablet By Mouth Two Times A Day 5)  Multi-Vitamin .... Take 1 Tablet By Mouth Once A Day 6)  Promethazine Hcl 25 Mg Tabs (Promethazine Hcl) .... As Needed 7)  Ambien 10 Mg Tabs (Zolpidem Tartrate) .... Take 1 Tab By Mouth At Bedtime 8)  Trazodone Hcl 50 Mg Tabs (Trazodone Hcl) .... At Bedtime 9)  Vicodin 5-500 Mg Tabs (Hydrocodone-Acetaminophen) .... Two Times A Day As Needed  Allergies (verified): 1)  ! Codeine  Past History:  Past Medical History: Hx hypertension in the past Depression Anxiety Arthritis H/O MRSA wound infection required at least two hospitalizations and multiple outpatient treatments. EGD, 1/11--> Normal esophagus. Small hiatal hernia.  Surgically altered stomach with a small gastric pouch and  patent efferent and afferent small bowel limbs. Bleeding anastomotic ulcer as described above status post     epinephrine injection therapy and thermal sealing with the gold probe.  TCS, 3/11--> Normal rectum.  Long tortuous elongated colon.  Two diminutive hyperplastic polyps in mid sigmoid cold biopsy/removed.  Sleep Apnea H/O hypercalcemia secondary to hyperparathyroidism  Past Surgical History: gastric bypass at [facility], 2009 Appendectomy Cholecystectomy Hysterectomy -- partial Knee Replacement right, remote Tonsillectomy Accident resulted in injury and surgical repair of R wrist, L foot, & Hip/pelvis Nodules removed from neck, right thryoid lobectomy for adenoma C-section  Family History: Father: Deceased age 27  MI Mother: Deceased age 43   MVA   (Also had breast cancer) Siblings: 2 One sister deceased heart problems and cancer of the  uterus,breast, skin Living sister healthy  Social History: Marital Status: Married Children: 4 Occupation: Retired    Engineer, materials tob use. No alcohol use.   Review of Systems General:  Denies fever, chills, sweats, anorexia, fatigue, weakness, and weight loss. Eyes:  Denies discharge. ENT:  Denies nasal congestion, sore throat, hoarseness, and difficulty swallowing. CV:  Complains of peripheral edema; denies chest pains, angina, palpitations, and dyspnea on exertion. Resp:  Denies dyspnea at rest, dyspnea with exercise, cough, and sputum. GI:  See HPI. GU:  Denies urinary burning and blood in urine. MS:  Complains of joint pain / LOM. Derm:  Denies rash and itching. Neuro:  Denies weakness, frequent headaches,  memory loss, and confusion. Psych:  Denies depression and anxiety. Endo:  Denies unusual weight change. Heme:  Denies bruising and bleeding. Allergy:  Denies hives and rash.  Vital Signs:  Patient profile:   65 year old female Height:      66 inches Weight:      209 pounds BMI:     33.86 Temp:     98.7 degrees F oral Pulse rate:    64 / minute BP sitting:   108 / 70  (left arm) Cuff size:   regular  Vitals Entered By: Hendricks Limes LPN (May 20, 1608 11:01 AM)  Physical Exam  General:  Well developed, well nourished, no acute distress. Head:  Normocephalic and atraumatic. Eyes:  Conjunctivae pink, no scleral icterus.  Mouth:  Oropharyngeal mucosa moist, pink.  No lesions, erythema or exudate.    Neck:  Supple; no masses or thyromegaly. Lungs:  Clear throughout to auscultation. Heart:  Regular rate and rhythm; no murmurs, rubs,  or bruits. Abdomen:  normal bowel sounds and obese.  Tender in epigastrium and right of midline in mid-abd. No active drainage from incision but evidence of recent drainage. No erythema. No HSM or masses. ? mild bulging in right flank area, difficult to determine due to body habitus.  Extremities:  trace pedal edema.   Neurologic:  Alert and  oriented x4;  grossly normal neurologically. Skin:  Intact without significant lesions or rashes. Cervical Nodes:  No significant cervical adenopathy. Psych:  Alert and cooperative. Normal mood and affect.  Impression & Recommendations:  Problem # 1:  GASTRIC ULCER (ICD-531.90)  Anastomotic ulcer, due for repeat EGD. C/O ongoing abdominal pain. If nothing explains abd pain at time of EGD, consider repeat CT A/P. EGD to be performed in near future.  Risks, alternatives, benefits including but not limited to risk of reaction to medications, bleeding, infection, and perforation addressed.  Patient voiced understanding and verbal consent obtained.   Orders: Est. Patient Level IV ([address])  Problem # 2:  ANEMIA (ICD-285.9)  IDA. Also h/o B12 injection. Due for repeat CBC at this time.   Orders: T-CBC w/Diff ([id number]) Est. Patient Level IV ([address]56)  Appended Document: consult for EGD,problems with her stomach/ss Pt informed she is to do a CBC and order faxed to [facility].

## 2010-02-17 NOTE — Miscellaneous (Signed)
Summary: CT of abd/pelvis   01/2009  Clinical Lists Changes CT Abd/Pelvis W CM - STATUS: Final  IMAGE                                     Perform Date: 15Jan11 20:45  Ordered By: Ramond Dial Date: [id number] 19:12  Facility: [facility]                               Department: CT  Service Report Text  [facility] Accession Number: [id number]      Clinical Data: Left upper quadrant and left lower quadrant   abdominal pain.  History of gastric bypass procedure 1 year ago.   Status post cholecystectomy, appendectomy and partial hysterectomy.    CT ABDOMEN AND PELVIS WITH CONTRAST    Technique:  Multidetector CT imaging of the abdomen and pelvis was   performed following the standard protocol during bolus   administration of intravenous contrast.    Contrast: 100 ml Omnipaque-300.    Comparison: 07/25/2008.    Findings: Status post cholecystectomy.  Mild prominence   intrahepatic biliary ducts unchanged.  No focal hepatic, splenic,   pancreatic or adrenal lesion with mild hyperplasia of the adrenal   glands.  Nonobstructing bilateral renal calculi.    Calcified mildly ectatic aorta without aneurysmal dilation.   Increased number normal size to minimally prominent retroperitoneal   lymph nodes largest is in the interaortocaval lymph node measuring   12.5 x 10.6 mm unchanged.  Slight straightening small bowel   mesentery stable.    Status post gastric bypass procedure.  There is a gas distended   small bowel loop which is the part of the small bowel utilized for   the bypass procedure.  This is different than on the prior exam.   This could conceivably be the source of patient's discomfort and a   partial small bowel obstruction of this blind ending loop cannot be   excluded.  If there are progressive symptoms close follow-up with   attention to this recommended.    Mild diastasis rectus muscles with scarring.  No significant change   small fatty containing hernia right  lower lateral abdominal wall.   No pelvic inflammatory process.    Mild degenerative changes lower thoracic and lumbar spine.    IMPRESSION:   Status post gastric bypass procedure.  There is a gas distended   small bowel loop which is the part of the small bowel utilized for   the bypass procedure.  This is different than on the prior exam.   This could conceivably be the source of patient's discomfort and a   partial small bowel obstruction of this blind ending loop cannot be   excluded.  If there are progressive symptoms close follow-up with   attention to this recommended.    Stable appearance of slightly prominent size retroperitoneal nodes,   mild intrahepatic biliary duct prominent and stranding of the small   bowel mesentery.    Read By:  Fuller Canada,  M.D.   Released By:  Fuller Canada,  M.D.  Additional Information  HL7 RESULT STATUS : F  External image : [id number]  External IF Update Timestamp : 2009-02-01:21:24:27.000000

## 2010-02-17 NOTE — Miscellaneous (Signed)
Summary: Orders Update  Clinical Lists Changes  Problems: Added new problem of ANEMIA (ICD-285.9) Orders: Added new Test order of T-Iron [phone]) - Signed Added new Test order of T-Iron Binding Capacity (TIBC) ([id number]) - Signed Added new Test order of T-Ferritin [phone]) - Signed Added new Test order of T-Folate (01027) - Signed Added new Test order of T-B12, Serum Total Only (25366) - Signed

## 2010-02-17 NOTE — Miscellaneous (Signed)
Summary: CREATININE  Clinical Lists Changes  Problems: Added new problem of SCREENING FOR UNSPECIFIED CONDITION (ICD-V82.9) Orders: Added new Test order of T-Creatinine Blood [phone]) - Signed

## 2010-02-17 NOTE — Miscellaneous (Signed)
Summary: Discharge Summary  02/06/2009  Clinical Lists Changes  NAME:  [name], [name]                  ACCOUNT NO.:  [id number]      MEDICAL RECORD NO.:  [id number]          PATIENT TYPE:  INP      LOCATION:  A338                          FACILITY:  [facility]      PHYSICIAN:  Mila Homer. Sudie Bailey, M.D.DATE OF BIRTH:  [birth year]      DATE OF ADMISSION:  02/01/2009   DATE OF DISCHARGE:  LH                                  DISCHARGE SUMMARY      This 65 year old was admitted to the hospital with a GI bleed.  She had   a benign 5-day hospitalization extending from February 02, 2009 through   February 06, 2009.  Vital signs remained stable.      Admission white cell count was 8400, hemoglobin 9.6, platelet count of   184,000.  Her CMP showed a glucose of 153, BUN 37, creatinine 0.72.   Lipase was 14, INR 1.42.  UA showed a normal stick, but scope showed 7-   10 WBCs, 7-10 RBCs and a few bacteria.  Urine culture was still pending   at the time of discharge.      Her repeat CBC the second day showed the hemoglobin had dropped to 7.7   and after 2 units of blood was up to 9.1.  The third day, it was 9.4,   fourth day 9.2 and recheck 9.6.      Recheck BMP showed a glucose of 103, BUN 32, creatinine 0.77.   Helicobacter antibody was less than 0.4.      Her admission acute abdominal series showed a focal dilated loop of   small bowel.  CT of the abdomen and pelvis showed she was status post   gastric bypass procedure and __________ small bowel loop, and there was   a question of small bowel obstruction based on this.      She was admitted to the hospital.  She was put on IV at 100 mL an hour.   She was seen by Dr. Lovell Sheehan, general surgeon, initially with the thought   she might have an ileus, but then it was noted that really the diagnosis   was a GI bleed, felt to be probably secondary to an ulcer.  She was   given Dilaudid IV, Zofran IV for pain and nausea.  She was transfused 2   units of packed  cells.  GI was consulted her second day.  She was   started on Carafate suspension a gram p.o. q.i.d., clear liquid diet.   Protonix was an increased from 40 mg daily to 40 mg IV q.12 h.  The   third day, she had another unit of packed cells transfused.  She also   had an EGD, which showed an anastomotic ulcer, which was treated with a   probe.      She tended to be nauseated her fourth day, so IV Dilaudid was   discontinued.  She was put back on Vicodin and she seemed to be doing   well  her fifth day and ready for discharge home.  The nausea had   cleared, the abdominal pain improved.      DISCHARGE DIAGNOSES:   1. Gastrointestinal bleeding.   2. Anastomotic ulcer.   3. Anemia secondary to hemorrhage.   4. Ileus secondary to peptic ulcer disease.   5. Morbid obesity, status post gastric bypass procedure.   6. Gastroesophageal reflux disease.   7. Reactive depression.   8. Benign essential hypertension.   9. Lumbago.   10.Chronic anxiety.   11.Obstructive sleep apnea.   12.Hiatal hernia.   13.History of tobacco use for 32 years.      She is discharged home on hydrocodone/APAP 5/325 one-to-two tablets q.4   h., for pain (120) and the sucralfate a gram per 10 mL, 10 mL q.i.d. 5-   day course, no refills.  She is to continue doxepin 25 mg nightly,   Nexium 40 mg b.i.d., promethazine 25 mg b.i.d. for nausea, zolpidem 10   mg nightly for sleep and she can use acetaminophen 650 mg either by   suppository of pill q.4 h.  Follow-up will be in the office within the   week.               Mila Homer. Sudie Bailey, M.D.   Electronically Signed            SDK/MEDQ  D:  02/06/2009  T:  02/06/2009  Job:  [id number]

## 2010-02-17 NOTE — Letter (Signed)
Summary: External Other /Labs/Dr. Sudie Bailey  External Other /Labs/Dr. Sudie Bailey   Imported By: Cloria Spring LPN 60/45/4098 11:91:47  _____________________________________________________________________  External Attachment:    Type:   Image     Comment:   External Document

## 2010-02-17 NOTE — Letter (Signed)
Summary: Internal Other  Internal Other   Imported By: Peggyann Shoals 09/08/2009 12:48:04  _____________________________________________________________________  External Attachment:    Type:   Image     Comment:   External Document

## 2010-02-17 NOTE — Letter (Signed)
Summary: KUB ORDER  KUB ORDER   Imported By: Ave Filter 06/27/2009 10:21:33  _____________________________________________________________________  External Attachment:    Type:   Image     Comment:   External Document

## 2010-02-17 NOTE — Letter (Signed)
Summary: Internal Other Jarrett Ables for Agile Capsule  Internal Other /Orders for Agile Capsule   Imported By: Cloria Spring LPN 15/17/6160 73:71:06  _____________________________________________________________________  External Attachment:    Type:   Image     Comment:   External Document

## 2010-02-17 NOTE — Progress Notes (Signed)
Summary: PT REQUEST RESULTS OF GIVENS  Phone Note Call from Patient   Caller: Patient Summary of Call: Pt would like to have results of Givens that was done on 09/15/2009.  Initial call taken by: Cloria Spring LPN,  October 21, 2009 3:29 PM     Appended Document: PT REQUEST RESULTS OF GIVENS givens was scheduled for 10/15/2009 not 09/15/2009  Appended Document: PT REQUEST RESULTS OF GIVENS Called patient and discussed SB Givens results with her. SB loop dilated and few erosions but nothing specific to explain her pain. She has IDA/heme positive stool, may be secondary to gastric bypass vs friable anastomosis. H/H are better. I advised her to have H/H checked every 2-3 months with her PCP. We need to get her back to the bariatric clinic at [facility] (her surgeon is no longer there but still needs f/u due to ongoing abd pain). Please send copy of CT A/P from 5/11, 1/11, 7/10 AND UGI SBFT and SB Givens report.   Please arrange f/u appt with [facility] Bariatric clinic as above.  Appended Document: PT REQUEST RESULTS OF GIVENS Pt has an appt 11/05/09@3 :00pm Pt aware of appt. Clinicals faxed.

## 2010-02-17 NOTE — Letter (Signed)
Summary: DR Bufford Lope REFERRAL  DR PORTENIER REFERRAL   Imported By: Ave Filter 10/29/2009 11:09:01  _____________________________________________________________________  External Attachment:    Type:   Image     Comment:   External Document

## 2010-02-17 NOTE — Miscellaneous (Signed)
Summary: Orders Update  Clinical Lists Changes  Orders: Added new Test order of T-CBC w/Diff ([id number]) - Signed 

## 2010-02-17 NOTE — Progress Notes (Signed)
Summary: needs info faxed to [facility] for gas voucher  Phone Note Call from Patient   Caller: Patient Summary of Call: Pt said she gave wrong fax number yesterday for her gas voucher. She needs info faxed to [employer] @ [employer].Marland KitchenMarland [phone]. she needs it faxed right away so she can pick up today. Her appt is at the hospital on Monday 08/18/2009 and she will have to return in approx 30 hours, so she will go back on Tues also.   Initial call taken by: Cloria Spring LPN,  August 15, 2009 10:32 AM     Appended Document: needs info faxed to [facility] for gas voucher Notes confirming appt faxed to [facility]

## 2010-02-17 NOTE — Medication Information (Signed)
Summary: Tax adviser   Imported By: Diana Eves 05/19/2009 13:57:15  _____________________________________________________________________  External Attachment:    Type:   Image     Comment:   External Document  Appended Document: RX Folder pt has appt w/ lsl tomorrow.  Can be addressed at OV.

## 2010-02-17 NOTE — Letter (Signed)
Summary: EGD ORDER  EGD ORDER   Imported By: Ave Filter 05/20/2009 11:44:31  _____________________________________________________________________  External Attachment:    Type:   Image     Comment:   External Document

## 2010-02-17 NOTE — Letter (Signed)
Summary: Recall, Labs Needed  [facility]  [address]   [place], [place] [address]   Phone: [phone]  Fax: [phone]    August 12, 2009  [place] [address] [place], [place]  [address] 1945-11-15   Dear Ms. Schorr,   Our records indicate it is time to repeat your blood work.  You can take the enclosed form to the lab on or near the date indicated.  Please make note of the new location of the lab:   [address], 2nd floor   [facility]  Our office will call you within a week to ten business days with the results.  If you do not hear from [place] in 10 business days, you should call the office.  If you have any questions regarding this, call the office at [phone], and ask for the nurse.  Labs are due on 08/20/2009.   Sincerely,    Hendricks Limes LPN  [facility] Ph: [phone]   Fax: [phone]

## 2010-02-17 NOTE — Miscellaneous (Signed)
Summary: op note  Clinical Lists Changes  NAME:  [name], [name]                  ACCOUNT NO.:  [id number]      MEDICAL RECORD NO.:  [id number]          PATIENT TYPE:  AMB      LOCATION:  DAY                           FACILITY:  [facility]      PHYSICIAN:  Dalia Heading, M.D.  DATE OF BIRTH:  26-Aug-1945      DATE OF PROCEDURE:  09/12/2009   DATE OF DISCHARGE:  09/12/2009                                  OPERATIVE REPORT      PREOPERATIVE DIAGNOSIS:  Granuloma, abdominal wall.      POSTOPERATIVE DIAGNOSES:  Granuloma, abdominal wall, incisional hernia.      PROCEDURES:  Wound revision of abdominal wall, incisional herniorrhaphy   with mesh.      SURGEON:  Dalia Heading, MD      ANESTHESIA:  General endotracheal.      INDICATIONS:  The patient is a 65 year old white female status post   gastric bypass surgery approximately 2 years ago in [place] who had   multiple postoperative complications due to a MRSA infection of her   abdominal wall.  She now presents for wound revision.  She was told   preoperatively that she may need an incisional herniorrhaphy with mesh   should the abdominal wall be compromised.  The risks and benefits of the   procedure including bleeding, infection, cardiopulmonary difficulties,   and the possibly recurrence of the hernia were fully explained to the   patient, gave informed consent.      PROCEDURE NOTE:  The patient was placed in a supine position.  After   induction of general endotracheal anesthesia, the abdomen was prepped   and draped using the usual sterile technique with DuraPrep.  Surgical   site confirmation was performed.      A large surgical abdominal scar was noted along the midline   supraumbilically.  This was excised down to the fascia without   difficulty.  Sent to pathology for further examination.  Multiple old   nonabsorbable blue sutures were found and these were excised without   difficulty.  The fascia was noted to have multiple  areas of hernia   defects present.  It was elected to proceed with excision of the   compromised fascia and the scar tissue circumferentially around the   wound.  Omentum was just underneath and this was freed away from the   abdominal wall without difficulty.  A 10 x 15-cm Ethicon PhysioMesh was   then inserted and tacked to the abdominal wall using 2-0 Prolene   interrupted sutures.  The overlying fascia was then reapproximated over   the mesh using 0 Prolene inverted sutures.  An On-Q pump was inserted on   either side of the wound and brought through separate stab wounds   inferior to the incision line.  Sensorcaine 0.5% was then instilled into   the bulb.  The subcutaneous layer was reapproximated using 2-0 Vicryl   interrupted sutures.  The skin was closed using staples.  Betadine  ointment and dry sterile dressings were applied.      All tape and needle counts were correct at the end of the procedure.   The patient was extubated in the operating room and went back to   recovery room awake in stable condition.      COMPLICATIONS:  None.      SPECIMEN:  Skin and abdominal wall fascia.      BLOOD LOSS:  50 mL.               Dalia Heading, M.D.            MAJ/MEDQ  D:  09/12/2009  T:  09/13/2009  Job:  [id number]   cc:   Mila Homer. Sudie Bailey, M.D.   Fax: [phone]      Electronically Signed by Franky Macho M.D. on 09/16/2009 09:49:44 AM

## 2010-02-17 NOTE — Assessment & Plan Note (Signed)
Summary: stomach pains/labs to be done prior to appt/ss   Visit Type:  f/u Primary Care Provider:  Sudie Bailey  Chief Complaint:  abd pain and N/V.  History of Present Illness: Patient is here for f/u. She had bariatric surgery in 2009. She developed UGI bleed secondary to anastomotic ulcer. She had f/u EGD since 5/11 which showed healed ulcer. She has h/o ongoing abd pain. CT A/P 5/11--> post-op changes from gastric bypass, stable dilatation of a bowel loop in the left upper quadrant, bilateral renal calculi without hydronephrosis, stable mild retroperitoneal lymphadenopathy. UGI series showed nonspecific nonobstructive dilatation of the jejunum just distal to  the gastrojejunostomy anastomoses. Dr. Jena Gauss ordered patency capsule study in preparation for SB Givens capsule endoscopy. Unfortunately patient did not return for f/u xray due to illness, so study was inconclusive.   She c/o ongoing intermittent drainage from her abd incision. She has h/o MRSA. She saw Dr. Sudie Bailey on Friday and had labs. She states she is unable to tolerate by mouth meds for MRSA. She currently is not taking anything. She describes pulling long stitch from her incision. She is being referred to new surgeon.  She c/o ongoing abd pain, not necessarily related to meals. BM regular, nonbloody. Takes lidocaine with meals but doesn't really notice a difference. Pain is in upper abd but mostly right of her incision.  C/O regurgitation at night. Sleeps on inclined pillow. Takes Nexium two times a day.    Current Medications (verified): 1)  Nexium 40 Mg Cpdr (Esomeprazole Magnesium) .... One By Mouth Bid 2)  Alprazolam 1 Mg Tabs (Alprazolam) .... Two Times A Day 3)  B12 Injection .... Monthly 4)  Celexa 20 Mg Tabs (Citalopram Hydrobromide) .... Take 1 Tablet By Mouth Two Times A Day 5)  Multi-Vitamin .... Take 1 Tablet By Mouth Once A Day 6)  Trazodone Hcl 50 Mg Tabs (Trazodone Hcl) .... At Bedtime 7)  Hydromorphone Hcl 2 Mg  Tabs (Hydromorphone Hcl) .... Three Times A Day As Needed 8)  Lidocaine Hcl 1 % Soln (Lidocaine Hcl (Local Anesth.)) .... As Needed  Allergies (verified): 1)  ! Codeine  Review of Systems      See HPI  Vital Signs:  Patient profile:   65 year old female Height:      66 inches Weight:      208 pounds BMI:     33.69 Temp:     98.4 degrees F oral Pulse rate:   72 / minute BP sitting:   112 / 78  (left arm) Cuff size:   regular  Vitals Entered By: Hendricks Limes LPN (August 11, 2009 10:44 AM)  Physical Exam  General:  Well developed, well nourished, no acute distress. Head:  Normocephalic and atraumatic. Eyes:  sclera nonicteric Mouth:  op moist Lungs:  Clear throughout to auscultation. Heart:  Regular rate and rhythm; no murmurs, rubs,  or bruits. Abdomen:  normal bowel sounds and obese.  Tender in epigastrium and right of midline in mid-abd. No active drainage from incision but evidence of recent drainage. No erythema. No HSM or masses.  Extremities:  No clubbing, cyanosis, edema or deformities noted. Neurologic:  Alert and  oriented x4;  grossly normal neurologically. Skin:  Intact without significant lesions or rashes. Psych:  Alert and cooperative. Normal mood and affect.  Impression & Recommendations:  Problem # 1:  ABDOMINAL PAIN, UNSPECIFIED SITE (ICD-789.00)  Gastric ulcer healed. CT as above with stable dilated jejunum. Heme positive stool. Proceed with plan as per  Dr. Jena Gauss, patency capsule. Stressed importance to patient to have f/u abd films at times specified. Continue Nexium as before. Further recommendations to follow.   She is to follow-up with Dr. Sudie Bailey regarding abdominal incision issues.  Orders: Est. Patient Level II (09811)  Problem # 2:  GASTROESOPHAGEAL REFLUX DISEASE, CHRONIC (ICD-530.81)  Some nocturnal regurgitation. Strict adherence to anti-reflux measures. Continue Nexium 40mg  by mouth two times a day.   Orders: Est. Patient Level II  (91478)  Problem # 3:  ANEMIA (ICD-285.9)  Last H/H normal. F/U labs done at Dr. Michelle Nasuti on Friday.  Orders: Est. Patient Level II (29562)

## 2010-02-17 NOTE — Assessment & Plan Note (Signed)
Summary: Heme Card - cdg    One iFOBT returned and it was positive.      Allergies: 1)  ! Codeine  Other Orders: Immuno-chemical Fecal Occult ([address])  Appended Document: Heme Card - cdg we'll need to proceed with small bowel capsule study. Need to give patient patency capsule and followe short stay protocol. If patency capsule makes it through okay we'll perform a camera study.  Appended Document: Heme Card - cdg Pt informed.  Appended Document: Heme Card - cdg Pt scheduled for an Agile Capsule Study on 06/26/09@8 :30a.m.  Pt aware of appt.

## 2010-02-17 NOTE — Progress Notes (Signed)
Summary: phone note/? about doing givens  Phone Note Call from Patient   Caller: Patient Summary of Call: Pt called with FYI....Dr. Girtha Rm is doing surgery for scar tissue on Friday, 09/12/2009 as outpatient. She wants to know if Dr. Jena Gauss still wants her to do the Givens as scheduled for Mon. August 29th?? Please advise! Initial call taken by: Cloria Spring LPN,  September 10, 2009 2:54 PM     Appended Document: phone note/? about doing givens lets wait until after surgery; will need op note for review before rescheduling capsule  Appended Document: phone note/? about doing givens Pt informed. LMOM for Kim. Cancelled in IDX.  Appended Document: phone note/? about doing givens need op note;  Appended Document: phone note/? about doing givens Entered the OPT note!

## 2010-02-17 NOTE — Letter (Signed)
Summary: AGILE CAPSULE STUDY ORDER  AGILE CAPSULE STUDY ORDER   Imported By: Ave Filter 06/23/2009 10:14:47  _____________________________________________________________________  External Attachment:    Type:   Image     Comment:   External Document

## 2010-02-17 NOTE — Progress Notes (Signed)
Summary: wants zofran refill and capsule scheduled  Phone Note Call from Patient Call back at Home Phone [phone]   Caller: Patient Summary of Call: pt called- left voicemail- wants to know when capsule is going to be scheduled ( op note in EMR) Pt also stated she was still having N/V and wants to know if she can have refill of her nausea medication (zofran) called in. please advise Initial call taken by: Hendricks Limes LPN,  October 03, 2009 3:37 PM     Appended Document: wants zofran refill and capsule scheduled capsule study of small bowel can now be scheduled  Appended Document: wants zofran refill and capsule scheduled Please advise on the Zofran.  Appended Document: wants zofran refill and capsule scheduled Givens Capsule rescheduled to 10/15/09@7 :30a.m.  Appended Document: wants zofran refill and capsule scheduled Pt aware.

## 2010-02-17 NOTE — Progress Notes (Signed)
Summary: phone note/ appt for Agile capsule  Phone Note Outgoing Call   Call placed by: Wilberto Console Call placed to: Patient Summary of Call: I called and informed pt of her appt 08/18/2009 @ [facility] Short Stay for the Agile capsule. She is to be there at 7:00 AM, and appt is at 7:30 AM. She is to be NPO after midnight and will need to return approx 30 hours later. pt aware of all. Initial call taken by: Cloria Spring LPN,  August 13, 2009 3:34 PM

## 2010-02-17 NOTE — Progress Notes (Signed)
Summary: abd pain  Phone Note Call from Patient Call back at [phone]   Caller: Patient Summary of Call: pt called- having alot of abd pain anytime she eats or drinks anything. stated it felt like knives sticking in her stomach. Last BM was this morning. no fever. she is having N/V. pt stated Dr. Sudie Bailey has given her some hydromorphone in the past and she is taking that some now and its not helping. She also stated he wont give her anymore. wants to know if there is anything else she can do. pt is aware that [facility] is out of the office on vacation/family issues. pt uses [employer]. please advise.  Initial call taken by: Hendricks Limes LPN,  July 09, 2009 3:21 PM     Appended Document: abd pain Please call pt. She should follow a full liquid diet for 2 days. Use Tylenol as needed. May use Viscous Lidocaine 10 cc by mouth 30 minutes before meals, #300 ml, rfx1. She may call Dr. Jena Gauss if the Lidocaine doesn't help. Sh emay use Phenergan 25 mg 1 by mouth q4-6prn nausea or vomiting, #30 rfx0.  Appended Document: abd pain rxs called in- pt aware

## 2010-02-17 NOTE — Letter (Signed)
Summary: Patient Notice, Colon Biopsy Results  [facility]  [address]   [place], [place] [address]   Phone: [phone]  Fax: [phone]       April 09, 2009   [place] [address] [place], [place]  [address] 1945/04/07    Dear [name],  I am pleased to inform you that the biopsies taken during your recent colonoscopy did not show any evidence of cancer upon pathologic examination.  Additional information/recommendations:  You should have a repeat colonoscopy examination  in 10 years.  Please call us if you are having persistent problems or have questions about your condition that have not been fully answered at this time.  Sincerely,    R. Roetta Sessions MD  [facility] Ph: [phone]    Fax: [phone]   Appended Document: Patient Notice, Colon Biopsy Results Letter mailed to pt.

## 2010-02-17 NOTE — Letter (Signed)
Summary: Recall, Labs Needed  [facility]  [address]   [place], [place] [address]   Phone: [phone]  Fax: [phone]    July 04, 2009  [place] [address] [place], [place]  [address] 1945-08-19   Dear Ms. Jorden,   Our records indicate it is time to repeat your blood work.  You can take the enclosed form to the lab on or near the date indicated.  Please make note of the new location of the lab:   [address], 2nd [address]   [employer]  Our office will call you within a week to ten business days with the results.  If you do not hear from [place] in 10 business days, you should call the office.  If you have any questions regarding this, call the office at [phone], and ask for the nurse.  Labs are due on 07/18/2009.   Sincerely,    Hendricks Limes LPN  [facility] Ph: [phone]   Fax: [phone]

## 2010-02-17 NOTE — Assessment & Plan Note (Signed)
Summary: HOS FU. ULCERS/GU   Visit Type:  Follow-up Visit Primary Care Provider:  Sudie Bailey  Chief Complaint:  F/U hosp/ulcers.  History of Present Illness: 65 year old lady hospitalized recently with upper GI bleed. She had an actively  bleeding anastomotic ulcer which required therapeutic endoscopy by me. She tells me she's refrained from taking any nonsteroidals since that time. She continues to have intermittent knawing epigastric pain.  She has lost a notable 31 pounds since she was here prior to her hospitalization. She has not had any melena or rectal bleeding. She continues on Nexium 40 mg orally twice daily. A CT of the abdomen during her hospitalization was notable for isolated small bowel loops; she saw Dr. Lovell Sheehan. This was felt to be more of an ileus rather than a  bowel obstruction. Also showed upper lobe limit the common bile duct diameter is upper limits celiac nodes.  Last colonoscopy 2006; would be due for a followup screening 2016   Current Medications (verified): 1)  Nexium 40 Mg Cpdr (Esomeprazole Magnesium) .... One By Mouth Bid 2)  Alprazolam 1 Mg Tabs (Alprazolam) .... Two Times A Day 3)  B12 Injection .... Monthly 4)  Celexa 20 Mg Tabs (Citalopram Hydrobromide) .... Take 1 Tablet By Mouth Two Times A Day 5)  Multi-Vitamin .... Take 1 Tablet By Mouth Once A Day 6)  Promethazine Hcl 25 Mg Tabs (Promethazine Hcl) .... As Needed 7)  Ambien 10 Mg Tabs (Zolpidem Tartrate) .... Take 1 Tab By Mouth At Bedtime  Allergies (verified): 1)  ! Codeine  Past History:  Family History: Last updated: 04/16/09 Father: Deceased age 25  MI Mother: Deceased age 71   MVA   (Also had breast cancer) Siblings: 2  One sister deceased heart problems and cancer of the  uterus,breast  Living sister healthy  Social History: Last updated: Apr 16, 2009 Marital Status: Married Children: 4 Occupation: Retired     Past Medical History: Hx hypertension in the  past Depression Anxiety Arthritis Ulcers  Past Surgical History: gastric bypass at [facility] Appendectomy Cholecystectomy Hysterectomy -- partial Knee Replacement right Tonsillectomy Accident resulted in injury and surgical repair of R wrist, L foot, & Hip/pelvis Nodules removed from neck  Family History: Father: Deceased age 62  MI Mother: Deceased age 104   MVA   (Also had breast cancer) Siblings: 2  One sister deceased heart problems and cancer of the  uterus,breast  Living sister healthy  Social History: Marital Status: Married Children: 4 Occupation: Retired     Vital Signs:  Patient profile:   65 year old female Height:      66 inches Weight:      204 pounds BMI:     33.05 Temp:     97.9 degrees F oral Pulse rate:   56 / minute BP sitting:   128 / 80  (left arm) Cuff size:   regular  Vitals Entered By: Cloria Spring LPN (March 20, 1608 9:22 AM)  Impression & Recommendations: Impression: 65 year old lady with a history of complicated peptic ulcer disease presented with the recent upper GI bleed requiring therapeutic endoscopy. She continues to have some intermittent epigastric pain. She's on b.i.d. PPI therapy. She has lost a significant amount of weight since we saw her last year. CT findings as noted above. Ongoing epigastric pain is concerning.    Recommendations: Continue to refrain from taking nonsteroidal agents. Continue Nexium 40 mg b.i.d. and Carafate 1 g q.i.d. in suspension form.  Check in 20 and CBC today;  we'll plan for a repeat EGD mid April 2011.  Other Orders: T-Comprehensive Metabolic Panel [phone]) T-Lipase [phone]) T-CBC w/Diff [phone])       Appended Document: Orders Update    Clinical Lists Changes  Orders: Added new Service order of Est. Patient Level IV (25956) - Signed

## 2010-02-17 NOTE — Miscellaneous (Signed)
Summary: EGD  02/12/2009  Clinical Lists Changes   NAME:  Kendra, [name]                  ACCOUNT NO.:  [id number]      MEDICAL RECORD NO.:  [id number]          PATIENT TYPE:  INP      LOCATION:  A338                          FACILITY:  [facility]      PHYSICIAN:  R. Roetta Sessions, M.D. DATE OF BIRTH:  [birth year]      DATE OF PROCEDURE:   DATE OF DISCHARGE:                                  OPERATIVE REPORT      EGD with bleeding control therapy.      INDICATIONS FOR PROCEDURE:  Pleasant 65 year old lady admitted to the   hospital with hematemesis, melena in the setting of Naprosyn use.  She   has remained hemodynamically stable and has required transfusion.  EGD   is now being done.  Risks, benefits, alternatives and limitations have   been discussed, questions answered.  Please see the documentation in   medical record.      PROCEDURE NOTE:  O2 saturation, blood pressure, pulse, respiration were   monitored throughout entire procedure.      CONSCIOUS SEDATION:  Versed 4 mg IV, Demerol 100 mg IV in divided doses.   Cetacaine spray for topical pharyngeal anesthesia.      INSTRUMENT:  Pentax video chip system.      FINDINGS:  Examination of the tubular esophagus revealed normal-   appearing mucosa.  EG junction easily traversed.      STOMACH:  Gastric cavity was surgically altered.  There was a small   proximal gastric pouch with blood-tinged gastric mucosa.  Thorough   examination of the gastric pouch including retroflexed proximal stomach   esophagogastric junction demonstrated only a small hiatal hernia   proximally.  However, there appeared to be a Billroth II type   gastrojejunostomy with two small bowel limbs leaving the gastric pouch.   At the anastomosis there was a 0.5 to 1.5 cm deep ulcer crater with a   protuberant visible vessel and ongoing pulsatile bleeding.  Please see   photos.  Assessment of the efferent and afferent limbs demonstrated   normal-appearing mucosa.   Attention was focused on the anastomotic ulcer   and 3.5 mL of 1:2000 epinephrine were injected submucosally to clear the   field with vasoconstriction.  I subsequently washed the ulcer base and   got a good field of view.  I subsequently sealed the vessel with the   gold probe with several applications of 30 joules each.  This was done   without difficulty or apparent complication.  The patient tolerated.      This appeared to induce very good hemostasis.  This area was observed   for a good 10 minutes.  The patient tolerated the procedure well and was   reactive after endoscopy.      IMPRESSION:   1. Normal esophagus.   2. Small hiatal hernia.   3. Surgically altered stomach with a small gastric pouch and patent       efferent and afferent small bowel limbs.   4.  Bleeding anastomotic ulcer as described above status post       epinephrine injection therapy and thermal sealing with the gold       probe.      RECOMMENDATIONS:   1. Increase Protonix to 40 mg orally twice daily and Carafate 1 gram       suspension q.i.d. x5 days.   2. Clear liquid diet.   3. Check H pylori serologies.   4. Avoid nonsteroidal agents from here on out.   5. Would keep her hemoglobin in the 8 to 9 range.               Jonathon Bellows, M.D.            RMR/MEDQ  D:  02/03/2009  T:  02/03/2009  Job:  [id number]      cc:   Mila Homer. Sudie Bailey, M.D.   Fax: [phone]      Dalia Heading, M.D.   Fax: [phone]         Electronically Signed by Lorrin Goodell M.D. on 02/12/2009 03:44:34 PM

## 2010-02-17 NOTE — Letter (Signed)
Summary: Internal Other  Internal Other   Imported By: Diana Eves 11/21/2009 13:10:05  _____________________________________________________________________  External Attachment:    Type:   Image     Comment:   External Document

## 2010-02-18 ENCOUNTER — Encounter: Payer: Self-pay | Admitting: Adult Health

## 2010-02-19 NOTE — Progress Notes (Signed)
  Phone Note Call from Patient   Caller: Patient Reason for Call: Talk to Nurse Summary of Call: Kimberely called to check on stress test results. I told her the nurse would contact her with result once the MD has reviewed. She asked that the nurse call her at [phone] or at her child's phone at [phone].  Follow-up for Phone Call        results given to pt, verbalized understanding Follow-up by: Teressa Lower RN,  February 03, 2010 4:35 PM

## 2010-02-19 NOTE — Miscellaneous (Signed)
Summary: HISTORY & PHYSICAL  Clinical Lists Changes  NAME:  [name], [name]                  ACCOUNT NO.:  [id number]      MEDICAL RECORD NO.:  [id number]          PATIENT TYPE:  AMB      LOCATION:  DAY                           FACILITY:  [facility]      PHYSICIAN:  Dennie Maizes, M.D.   DATE OF BIRTH:  [birth year]      DATE OF ADMISSION:  12/31/2009   DATE OF DISCHARGE:  LH                                 HISTORY          CHIEF COMPLAINT:  Passing dark-colored urine, intermittent left flank   pain, diffuse abdominal pain.      HISTORY OF PRESENT ILLNESS:  This 65 year old female has been evaluated   for hematuria in the past.  X-rays revealed bilateral small   nonobstructing renal calculi.  Thereafter, decreased abdominal pain and   nausea recently.  The patient also has intermittent left flank pain.   Evaluation was done with CT scan of abdomen and pelvis with contrast.   This revealed bilateral nonobstructing renal calculi.  There were right   4-5 mm in size stones in the left renal pelvis 10 x 4 mm in size.   Intermittent left flank pain at present.  She also has been passing dark   bloody urine.  There is no history of fever, chills, voiding difficulty   or dysuria.      PAST MEDICAL HISTORY:   1. Post gastric bypass surgery.   2. Cholecystectomy.   3. Hysterectomy.   4. Appendectomy.   5. Knee replacement.   6. Depression.   7. Anxiety.   8. Hypothyroidism.      MEDICATIONS:   1. Alprazolam 1 mg p.o. t.i.d.   2. Hydromorphone 2 mg p.o. t.i.   4. Citalopram 20 mg 1 p.o. daily.      ALLERGIES:  She is allergic to CODEINE.      REVIEW OF SYSTEMS:  Positive for abdominal pain, nausea, and bilateral   flank pain.      FAMILY HISTORY:  Unremarkable.      SOCIAL HISTORY:  The patient is a nonsmoker, and she does not drink.      PHYSICAL EXAMINATION:  HEAD, EYES, EARS, NOSE AND THROAT:  Normal.   LUNGS:  Clear to auscultation.   HEART: Regular rate and rhythm.  No murmurs.     ABDOMEN:  Soft.  Palpable flank and CVA tenderness.  Bladder is not   palpable.      X-ray and KUB done recently revealed a 4 stones in the right kidney   measuring 5-6 mm in size.  The stone in the right renal system measures   4 mm in size.      IMPRESSION:   1. Bilateral renal calculi.   2. Left flank pain.   3. Hematuria.      PLAN:  I have discussed with the patient regarding the diagnosis,   operative details and alternative treatments.  She is scheduled to   undergo extracorporeal shock wave lithotripsy of symptomatic  left renal   calculus.  This will be done with IV sedation at [facility]  I   have informed the patient regarding the diagnosis, operative details of   the treatment, outcome, possible risks and complications, and she has   agreed for the procedure to be done.  She has two stones on the right   that may need treatment later.               Dennie Maizes, M.D.               SK/MEDQ  D:  12/30/2009  T:  12/30/2009  Job:  [id number]      cc:   Mila Homer. Sudie Bailey, M.D.   Fax: [phone]      R. Roetta Sessions, M.D.   [address]   [place]   [place] [address]      [facility]   Fax: [phone]      Electronically Signed by Dennie Maizes M.D. on 01/14/2010 09:58:34 AM

## 2010-02-19 NOTE — Miscellaneous (Signed)
Summary: hospital labs 01/16/2010  Clinical Lists Changes  Observations: Added new observation of ABSOLUTE BAS: 0.0 K/uL (01/16/2009 9:49) Added new observation of BASOPHIL %: 1 % (01/16/2009 9:49) Added new observation of EOS ABSLT: 0.2 K/uL (01/16/2009 9:49) Added new observation of % EOS AUTO: 3 % (01/16/2009 9:49) Added new observation of ABSOLUTE MON: 0.4 K/uL (01/16/2009 9:49) Added new observation of MONOCYTE %: 7 % (01/16/2009 9:49) Added new observation of ABS LYMPHOCY: 2.5 K/uL (01/16/2009 9:49) Added new observation of LYMPHS %: 42 % (01/16/2009 9:49) Added new observation of ABS NEUTROPH: 2.8 K/uL (01/16/2009 9:49) Added new observation of PLATELETK/UL: 176 K/uL (01/16/2009 9:49) Added new observation of RDW: 14.9 % (01/16/2009 9:49) Added new observation of MCHC RBC: 32.2 g/dL (78/29/5621 3:08) Added new observation of MCV: 82.5 fL (01/16/2009 9:49) Added new observation of HCT: 34.5 % (01/16/2009 9:49) Added new observation of HGB: 11.1 g/dL (65/78/4696 2:95) Added new observation of RBC M/UL: 4.18 M/uL (01/16/2009 9:49) Added new observation of WBC COUNT: 5.8 10*3/microliter (01/16/2009 9:49)

## 2010-02-19 NOTE — Letter (Signed)
Summary: Appointment - Missed  [facility] [place]  [address] [place] [address]   Phone: [phone]  Fax: [phone]     February 09, 2010 MRN: [id number]   [facility] [address] [place], [place]  [address]   Dear Ms. Bebee,  Our records indicate you missed your appointment on     02/09/10                  with Dr.       .          ROTHBART                          It is very important that we reach you to reschedule this appointment. We look forward to participating in your health care needs. Please contact us at the number listed above at your earliest convenience to reschedule this appointment.     Sincerely,    Glass blower/designer

## 2010-02-23 NOTE — Consult Note (Addendum)
[name], [name]                  ACCOUNT NO.:  [id number]  MEDICAL RECORD NO.:  [id number]          PATIENT TYPE:  INP  LOCATION:  A309                          FACILITY:  [facility]  PHYSICIAN:  Kendra Friends. Dietrich Pates, MD, FACCDATE OF BIRTH:  [birth year]  DATE OF CONSULTATION:  01/16/2010 DATE OF DISCHARGE:                                CONSULTATION   PRIMARY CARDIOLOGIST:  Cletis Athens. Dorethea Clan, MD, will now be Kendra Friends. Dietrich Pates, MD, [facility]  PRIMARY CARE PHYSICIAN:  Mila Homer. Sudie Bailey, MD  REASON FOR CONSULTATION:  Chest pain with palpitations.  HISTORY OF PRESENT ILLNESS:  This 65 year old Caucasian female with complaints of chest pain with cardiac catheterization in 2003, revealing no CAD who felt "my body vibrating" and then feeling soreness in her chest like someone has hit her.  She states that last 5-10 minutes.  She had a recent kidney lithotripsy and said that she stopped breathing during that procedure, although this is not documented.  She feels lightheaded, dizziness and falls often at home.  She saw Dr. Sudie Bailey today in the ER and we are asked to evaluate for complaints of palpitations and chest discomfort.  She admits to not taking her medications for the last 3-4 days.  REVIEW OF SYSTEMS:  Positive for chest pain, anxiety, feeling soreness in her chest and "vibrating" in her heart.  All other systems are reviewed and are found to be negative.  CODE STATUS:  Full.  PAST MEDICAL HISTORY: 1. Admissions for atypical chest pain.     a.     Cardiac catheterization in 2003 per Dr. Dorethea Clan revealing      normal coronary anatomy.     b.     Status post stress Myoview in April 2006, revealing normal      without evidence of ischemia. 2. Depression. 3. Anxiety. 4. Hypothyroidism. 5. GERD.  Most recent LV function evaluation revealed an LV function     of 74%.  PAST SURGICAL HISTORY:  Cholecystectomy, gastric bypass surgery, hysterectomy, appendectomy.  SOCIAL  HISTORY:  She lives in [place] with her daughter.  She is married, but is separated.  She has 4 children.  She quit smoking 13 years.  She does not drink or use drugs.  CURRENT MEDICATIONS AT HOME: 1. Levoxyl 25 mcg daily. 2. Hydromorphone 2 mg daily. 3. Carafate with meals. 4. Xanax 1 mg daily. 5. Citalopram 40 mg daily. 6. Nexium 40 mg daily.  ALLERGIES:  To CODEINE, STATIN, ZOLOFT, and LEXAPRO (the patient admits not taking for the last 3-4 days).  CURRENT AVAILABLE LABORATORY DATA:  Hemoglobin 11.1, hematocrit 34.5, white blood cell 5.8, platelets 176.  Chemistries are pending.  X-rays are pending.  EKG revealing sinus bradycardia rate of 58 beats per minute with T-wave flattening laterally and changed from prior EKG.  PHYSICAL EXAMINATION:  VITAL SIGNS:  Blood pressure 133/79, pulse 53, respirations 16, temperature 97.5, O2 sats 96% on room air. GENERAL:  She is awake, alert, oriented, anxious. HEENT:  Head is normocephalic and atraumatic.  Eyes, PERRLA. NECK:  Supple without JVD or carotid bruits appreciated. CARDIOVASCULAR:  Regular rate  and rhythm without murmurs, rubs or gallops.  Pulses are 2+ and equal without bruits. LUNGS:  Clear to auscultation without wheezes, rales or rhonchi. ABDOMEN:  Soft, nontender, 2+ bowel sounds. EXTREMITIES:  Without clubbing, cyanosis or edema. MUSCULOSKELETAL:  No joint deformity or effusions. NEURO:  Cranial nerves II through XII are grossly intact.  IMPRESSION: 1. Atypical chest pain described as "shaking heart" with chest     pressure as if someone were hitting her in the chest.  EKG shows     nonspecific T-wave abnormalities laterally with no irregularity or     arrhythmias.  Initial cardiac enzymes were negative.  I am waiting     labs for complete picture.  Last cardiac evaluation revealed normal     LEEP study with an EF normal in the 70s. 2. Anxiety and depression. 3. Medical noncompliance, not taking meds for 4 days. 4.  Hypothyroidism.  PLAN:  This is a 65 year old anxious Caucasian female who presented with atypical chest pain, complaints of shaking in her chest and soreness. She has no known cardiac disease with a negative cath 8 years ago and normal stress Myoview 5 years ago.  Initial EKG and markers are normal. If she rules out, okay to discharge in the a.m. with outpatient stress echo to be planned.  On behalf of the physicians and providers of [facility], we would like to thank Dr. Sudie Bailey for allowing [place] to participate in the care of this patient.     Bettey Mare. Lyman Bishop, NP   ______________________________ Kendra Friends. Dietrich Pates, MD, [facility]    KML/MEDQ  D:  01/16/2010  T:  01/17/2010  Job:  [id number]  cc:   Mila Homer. Sudie Bailey, M.D. Fax: [phone]  Electronically Signed by Joni Reining NP on 01/20/2010 04:21:56 PM Electronically Signed by Bradford Bing MD [facility] on 02/23/2010 08:13:26 AM

## 2010-02-25 NOTE — Assessment & Plan Note (Signed)
Summary: ePH POST STRESS TEST [name]   Visit Type:  Follow-up Primary Provider:  Dr.Knowlton   History of Present Illness: [name] is a 66 y/o CF we are following for complaints of DOE, lightheadedness who was seen last by Dr. Dietrich Pates in early Jan2012.  She has a history of multiple medical issues  to include gastric bypass surgery from which she states that she has not fully recovered.  She has many somatic complaints. She had a stress echo completed as a result of the last appointment wilth Dr.Rothbart and is here to discuss the results. She is followed by Dr. Sudie Bailey for cholesterol levels and is not on medications for this presently.  Current Medications (verified): 1)  Nexium 40 Mg Cpdr (Esomeprazole Magnesium) .... One By Mouth Bid 2)  Alprazolam 1 Mg Tabs (Alprazolam) .... Two Times A Day 3)  B12 Injection .... Monthly 4)  Multi-Vitamin .... Take 1 Tablet By Mouth Once A Day 5)  Trazodone Hcl 50 Mg Tabs (Trazodone Hcl) .... At Bedtime 6)  Hydrocodone-Acetaminophen 5-500 Mg Tabs (Hydrocodone-Acetaminophen) .... Take 1 Tab As Directed For Pain 7)  Fluoxetine Hcl 20 Mg Tabs (Fluoxetine Hcl) .... Take 1 Tab Daily  Allergies (verified): 1)  ! Codeine  Comments:  Nurse/Medical Assistant: patient brought her meds states she is going to have to stop her prozac do to it isnt working for her [facility]  Review of Systems       Dizziness, lightheadedness, generalized fatigue, ongoing depression  All other systems have been reviewed and are negative unless stated above.   Vital Signs:  Patient profile:   65 year old female Weight:      197 pounds BMI:     31.91 O2 Sat:      97 % on Room air Pulse rate:   75 / minute BP sitting:   119 / 65  (left arm)  Vitals Entered By: Dreama Saa, CNA (February 17, 2010 11:12 AM)  O2 Flow:  Room air  Physical Exam  General:  normal appearance.   Lungs:  Clear bilaterally to auscultation and percussion. Heart:   Non-displaced PMI, chest non-tender; regular rate and rhythm, S1, S2 without murmurs, rubs or gallops. Carotid upstroke normal, no bruit. Normal abdominal aortic size, no bruits. Femorals normal pulses, no bruits. Pedals normal pulses. No edema, no varicosities. Abdomen:  Bowel sounds positive; abdomen soft and non-tender without masses, organomegaly, or hernias noted. No hepatosplenomegaly. Msk:  Back normal, normal gait. Muscle strength and tone normal. Pulses:  pulses normal in all 4 extremities Extremities:  No clubbing or cyanosis. Neurologic:  Alert and oriented x 3. Psych:  depressed affect.     Impression & Recommendations:  Problem # 1:  HYPERTENSION (ICD-401.9) Review of stress myoview demonstrated frequent PAC's but negative for ischemia. She appears stable from a CV standpoint.  I have given her reassurance and will see her on a as needed basis.  She should continue to follow-up with Dr. Sudie Bailey for continued cardiac risk factor modification to include BP and cholesterol management and assessment.  Patient Instructions: 1)  Your physician recommends that you schedule a follow-up appointment in: as needed  2)  Your physician recommends that you continue on your current medications as directed. Please refer to the Current Medication list given to you today.

## 2010-02-25 NOTE — Miscellaneous (Signed)
Summary: Post-admission to [facility]-appt. cancelled; chart updated  P Allergies: 1)  ! Codeine  Past History:  Past Medical History: Chest pain: Cardiac cath in 2003-normal; stress echo in 01/2010-normal Hypertension Palpitations Nephrolithiasis with hydronephrosis; lithotripsy at [facility] in mid-2011 Hypothyroid Obesity-bariatric surgery Sleep apnea Hypercalcemic secondary to hyperparathyroidism Depression/anxiety Degenerative joint disease _____________________GI Hx_____________ H/O MRSA abdominal wound infection required at least two hospitalizations and multiple outpatient treatments. EGD, 1/11--> Normal esophagus. Small hiatal hernia.  Surgically altered stomach with a small gastric pouch and patent efferent and afferent small bowel limbs. Bleeding anastomotic ulcer as described above status post     epinephrine injection therapy and thermal sealing with the gold probe.  TCS, 3/11--> Normal rectum.  Long tortuous elongated colon.  Two diminutive hyperplastic polyps in mid sigmoid cold biopsy/removed.   Past Surgical History: Bariatric surgery-gastric bypass at [facility], 2009 Appendectomy Cholecystectomy Hysterectomy -- partial Knee Replacement right, remote Tonsillectomy Accident resulted in injury and surgical repair of R wrist, L foot, & Hip/pelvis Nodules removed from neck, right thryoid lobectomy for adenoma C-section   Social History: Marital Status: Married Children: 4 Occupation: Retired    Engineer, petroleum and modest use Alcohol-none

## 2010-02-25 NOTE — Miscellaneous (Signed)
Summary: ct of abdomin 02/05/2010  Clinical Lists Changes  Observations: Added new observation of CT OF ABDOME: Clinical Data:  Kidney stones, follow up right-sided stone, right   side pain    ABDOMEN - 1 VIEW    Comparison: 01/29/2010    Findings:   Artifacts from bowel gas.   Calculus at the right kidney on the previous exam is not definitely   visualized on current study.   A single tiny focus of questionable calcification is identified   lateral to the right transverse process of L2, questionable tiny   renal calculus versus bowel artifact.   No potential ureteral calcifications are seen.   Scattered vascular calcifications.   Bowel gas pattern normal.   Bones demineralized.    IMPRESSION:   Questionable tiny right renal calcification versus bowel artifact.   No definite ureteral calculus seen.    Read By:  Lollie Marrow,  M.D. (02/05/2010 10:02)      CT of Abdomen  Procedure date:  02/05/2010  Findings:      Clinical Data:  Kidney stones, follow up right-sided stone, right   side pain    ABDOMEN - 1 VIEW    Comparison: 01/29/2010    Findings:   Artifacts from bowel gas.   Calculus at the right kidney on the previous exam is not definitely   visualized on current study.   A single tiny focus of questionable calcification is identified   lateral to the right transverse process of L2, questionable tiny   renal calculus versus bowel artifact.   No potential ureteral calcifications are seen.   Scattered vascular calcifications.   Bowel gas pattern normal.   Bones demineralized.    IMPRESSION:   Questionable tiny right renal calcification versus bowel artifact.   No definite ureteral calculus seen.    Read By:  Lollie Marrow,  Judie Petit.D.

## 2010-02-25 NOTE — Miscellaneous (Signed)
Summary: stress echo 01/29/2010  Clinical Lists Changes  Observations: Added new observation of ECHOINTERP:  Study Conclusions    - Stress ECG conclusions: Frequent PACs occurred during dobutamine     administration. The stress ECG was negative for ischemia,     demonstrating insignificant upsloping ST segment depression.   - Staged echo: There was no echocardiographic evidence for     stress-induced ischemia.   Dobutamine. Stress echocardiography. 2D. Height: Height: 167.6cm.   Height: 66in. Blood pressure: 138/78. Patient status: Outpatient.    -------------------------------------------------------------------- (01/29/2010 10:01)      Echocardiogram  Procedure date:  01/29/2010  Findings:       Study Conclusions    - Stress ECG conclusions: Frequent PACs occurred during dobutamine     administration. The stress ECG was negative for ischemia,     demonstrating insignificant upsloping ST segment depression.   - Staged echo: There was no echocardiographic evidence for     stress-induced ischemia.   Dobutamine. Stress echocardiography. 2D. Height: Height: 167.6cm.   Height: 66in. Blood pressure: 138/78. Patient status: Outpatient.    --------------------------------------------------------------------

## 2010-03-23 ENCOUNTER — Encounter: Payer: Self-pay | Admitting: Licensed Clinical Social Worker

## 2010-03-26 ENCOUNTER — Encounter (INDEPENDENT_AMBULATORY_CARE_PROVIDER_SITE_OTHER): Payer: Self-pay | Admitting: *Deleted

## 2010-03-26 DIAGNOSIS — Z9884 Bariatric surgery status: Secondary | ICD-10-CM | POA: Insufficient documentation

## 2010-03-26 DIAGNOSIS — F411 Generalized anxiety disorder: Secondary | ICD-10-CM | POA: Insufficient documentation

## 2010-03-26 DIAGNOSIS — E039 Hypothyroidism, unspecified: Secondary | ICD-10-CM | POA: Insufficient documentation

## 2010-03-30 LAB — DIFFERENTIAL
Basophils Absolute: 0 10*3/uL (ref 0.0–0.1)
Basophils Relative: 1 % (ref 0–1)
Eosinophils Absolute: 0.2 10*3/uL (ref 0.0–0.7)
Eosinophils Relative: 3 % (ref 0–5)
Lymphocytes Relative: 42 % (ref 12–46)
Monocytes Absolute: 0.4 10*3/uL (ref 0.1–1.0)

## 2010-03-30 LAB — CBC
HCT: 34.5 % — ABNORMAL LOW (ref 36.0–46.0)
MCH: 26.6 pg (ref 26.0–34.0)
MCHC: 32.2 g/dL (ref 30.0–36.0)
MCV: 82.5 fL (ref 78.0–100.0)
Platelets: 176 10*3/uL (ref 150–400)
RDW: 14.9 % (ref 11.5–15.5)

## 2010-03-30 LAB — CARDIAC PANEL(CRET KIN+CKTOT+MB+TROPI)
Relative Index: INVALID (ref 0.0–2.5)
Troponin I: 0.01 ng/mL (ref 0.00–0.06)

## 2010-03-31 NOTE — Miscellaneous (Signed)
Summary: allergies, problems, meds  Clinical Lists Changes  Problems: Added new problem of BARIATRIC SURGERY STATUS (ICD-V45.86) - Signed Added new problem of HYPOTHYROIDISM (ICD-244.9) - Signed Added new problem of ANXIETY STATE, UNSPECIFIED (ICD-300.00) - Signed Medications: Added new medication of CITALOPRAM HYDROBROMIDE 40 MG TABS (CITALOPRAM HYDROBROMIDE) Take 1 tablet by mouth once a day - Signed Added new medication of DILAUDID 2 MG TABS (HYDROMORPHONE HCL) Take 1 tablet by mouth once a day - Signed Added new medication of LEVOXYL 25 MCG TABS (LEVOTHYROXINE SODIUM) Take 1 tablet by mouth once a day - Signed Allergies: Added new allergy or adverse reaction of * STATINS Added new allergy or adverse reaction of LEXAPRO Added new allergy or adverse reaction of ZOLOFT

## 2010-04-03 LAB — CBC
HCT: 34.3 % — ABNORMAL LOW (ref 36.0–46.0)
MCHC: 32.6 g/dL (ref 30.0–36.0)
Platelets: 153 10*3/uL (ref 150–400)
RDW: 14.9 % (ref 11.5–15.5)

## 2010-04-03 LAB — BASIC METABOLIC PANEL
BUN: 19 mg/dL (ref 6–23)
Creatinine, Ser: 0.86 mg/dL (ref 0.4–1.2)
GFR calc non Af Amer: >60 mL/min (ref >60)
Glucose, Bld: 109 mg/dL — ABNORMAL HIGH (ref 70–99)
Potassium: 4.2 mEq/L (ref 3.5–5.1)

## 2010-04-03 LAB — SURGICAL PCR SCREEN: MRSA, PCR: NEGATIVE

## 2010-04-05 LAB — COMPREHENSIVE METABOLIC PANEL
Albumin: 3.5 g/dL (ref 3.5–5.2)
BUN: 37 mg/dL — ABNORMAL HIGH (ref 6–23)
Creatinine, Ser: 0.72 mg/dL (ref 0.4–1.2)
GFR calc Af Amer: >60 mL/min (ref >60)
Potassium: 4.1 mEq/L (ref 3.5–5.1)
Total Protein: 5.9 g/dL — ABNORMAL LOW (ref 6.0–8.3)

## 2010-04-05 LAB — BASIC METABOLIC PANEL
Calcium: 8.3 mg/dL — ABNORMAL LOW (ref 8.4–10.5)
Creatinine, Ser: 0.77 mg/dL (ref 0.4–1.2)
GFR calc Af Amer: >60 mL/min (ref >60)
GFR calc non Af Amer: >60 mL/min (ref >60)
Sodium: 140 mEq/L (ref 135–145)

## 2010-04-05 LAB — DIFFERENTIAL
Basophils Absolute: 0.1 10*3/uL (ref 0.0–0.1)
Basophils Relative: 1 % (ref 0–1)
Basophils Relative: 1 % (ref 0–1)
Basophils Relative: 1 % (ref 0–1)
Eosinophils Absolute: 0.3 10*3/uL (ref 0.0–0.7)
Lymphocytes Relative: 26 % (ref 12–46)
Lymphocytes Relative: 47 % — ABNORMAL HIGH (ref 12–46)
Lymphs Abs: 2.2 10*3/uL (ref 0.7–4.0)
Lymphs Abs: 3.1 10*3/uL (ref 0.7–4.0)
Lymphs Abs: 3.3 10*3/uL (ref 0.7–4.0)
Monocytes Absolute: 0.4 10*3/uL (ref 0.1–1.0)
Monocytes Relative: 5 % (ref 3–12)
Monocytes Relative: 7 % (ref 3–12)
Monocytes Relative: 7 % (ref 3–12)
Neutro Abs: 5.7 10*3/uL (ref 1.7–7.7)
Neutro Abs: 3.1 10*3/uL (ref 1.7–7.7)
Neutro Abs: 3.5 10*3/uL (ref 1.7–7.7)
Neutrophils Relative %: 44 % (ref 43–77)
Neutrophils Relative %: 47 % (ref 43–77)
Neutrophils Relative %: 56 % (ref 43–77)

## 2010-04-05 LAB — CBC
HCT: 29.1 % — ABNORMAL LOW (ref 36.0–46.0)
MCHC: 33.5 g/dL (ref 30.0–36.0)
MCHC: 34.3 g/dL (ref 30.0–36.0)
MCV: 92 fL (ref 78.0–100.0)
MCV: 90.3 fL (ref 78.0–100.0)
Platelets: 184 10*3/uL (ref 150–400)
Platelets: 146 10*3/uL — ABNORMAL LOW (ref 150–400)
RBC: 2.5 MIL/uL — ABNORMAL LOW (ref 3.87–5.11)
RBC: 3.1 MIL/uL — ABNORMAL LOW (ref 3.87–5.11)
RDW: 15.2 % (ref 11.5–15.5)
WBC: 6.3 10*3/uL (ref 4.0–10.5)
WBC: 7 10*3/uL (ref 4.0–10.5)
WBC: 7.6 10*3/uL (ref 4.0–10.5)

## 2010-04-05 LAB — HEMOGLOBIN AND HEMATOCRIT, BLOOD
HCT: 25.1 % — ABNORMAL LOW (ref 36.0–46.0)
HCT: 26.8 % — ABNORMAL LOW (ref 36.0–46.0)

## 2010-04-05 LAB — TYPE AND SCREEN
ABO/RH(D): A POS
Antibody Screen: NEGATIVE

## 2010-04-05 LAB — LIPASE, BLOOD: Lipase: 14 U/L (ref 11–59)

## 2010-04-05 LAB — PROTIME-INR
INR: 1.42 (ref 0.00–1.49)
Prothrombin Time: 17.2 seconds — ABNORMAL HIGH (ref 11.6–15.2)

## 2010-04-05 LAB — URINE MICROSCOPIC-ADD ON

## 2010-04-05 LAB — PREPARE RBC (CROSSMATCH)

## 2010-04-05 LAB — URINALYSIS, ROUTINE W REFLEX MICROSCOPIC
Bilirubin Urine: NEGATIVE
Glucose, UA: NEGATIVE mg/dL
Specific Gravity, Urine: 1.02 (ref 1.005–1.030)

## 2010-04-05 LAB — H. PYLORI ANTIBODY, IGG: H Pylori IgG: <0.4 {ISR}

## 2010-04-05 LAB — GLUCOSE, CAPILLARY: Glucose-Capillary: 95 mg/dL (ref 70–99)

## 2010-04-06 LAB — COMPREHENSIVE METABOLIC PANEL
Albumin: 3.9 g/dL (ref 3.5–5.2)
BUN: 13 mg/dL (ref 6–23)
Chloride: 107 mEq/L (ref 96–112)
Creatinine, Ser: 0.97 mg/dL (ref 0.4–1.2)
Glucose, Bld: 88 mg/dL (ref 70–99)
Total Bilirubin: 0.3 mg/dL (ref 0.3–1.2)
Total Protein: 6.7 g/dL (ref 6.0–8.3)

## 2010-04-06 LAB — URINALYSIS, ROUTINE W REFLEX MICROSCOPIC
Protein, ur: NEGATIVE mg/dL
Urobilinogen, UA: 0.2 mg/dL (ref 0.0–1.0)

## 2010-04-06 LAB — CBC
HCT: 34.2 % — ABNORMAL LOW (ref 36.0–46.0)
MCV: 83.7 fL (ref 78.0–100.0)
Platelets: 197 10*3/uL (ref 150–400)
RDW: 21.5 % — ABNORMAL HIGH (ref 11.5–15.5)

## 2010-04-06 LAB — DIFFERENTIAL
Basophils Absolute: 0.1 10*3/uL (ref 0.0–0.1)
Eosinophils Relative: 2 % (ref 0–5)
Monocytes Absolute: 0.5 10*3/uL (ref 0.1–1.0)
Neutrophils Relative %: 44 % (ref 43–77)

## 2010-04-06 LAB — URINE MICROSCOPIC-ADD ON

## 2010-04-08 ENCOUNTER — Encounter: Payer: Self-pay | Admitting: Infectious Diseases

## 2010-04-08 ENCOUNTER — Ambulatory Visit (INDEPENDENT_AMBULATORY_CARE_PROVIDER_SITE_OTHER): Payer: Medicare Other | Admitting: Infectious Diseases

## 2010-04-08 VITALS — BP 121/83 | HR 80 | Temp 98.3°F | Ht 65.0 in | Wt 194.5 lb

## 2010-04-08 DIAGNOSIS — N211 Calculus in urethra: Secondary | ICD-10-CM | POA: Insufficient documentation

## 2010-04-08 DIAGNOSIS — N1330 Unspecified hydronephrosis: Secondary | ICD-10-CM

## 2010-04-08 DIAGNOSIS — A4902 Methicillin resistant Staphylococcus aureus infection, unspecified site: Secondary | ICD-10-CM

## 2010-04-08 MED ORDER — CHLORHEXIDINE GLUCONATE SOLN: 120.0000 | Status: AC

## 2010-04-08 MED ORDER — SULFAMETHOXAZOLE-TRIMETHOPRIM 400-80 MG PO TABS: 1.0000 | ORAL_TABLET | Freq: Two times a day (BID) | ORAL | Status: AC

## 2010-04-08 NOTE — Progress Notes (Signed)
  Subjective:    Patient ID: [name], female    DOB: 1945-06-08, 65 y.o.   MRN: [id number]  HPI 65 yo F with hx of L knee osteoarthritis and previous gastric bypass surgery Sept 2009 at [facility]. This was complicated by MRSA wound infection and she was treated with Vanco via PIC.  In September 2011 she had breakdown of the wound with foul fluid draining.    She had a CT scan 10-23-09 showing 0.8 x 3 cm postoperative fluid collection ("probable seroma"). She had a Cx of this fluid that grew MRSA by her PCP.  She underwent I & D of her wound at [facility] (she is unsure) (after noting that sutures were coming through her skin). She was d/c with VAC.  She was placed on anbx but is unable to remember the name.   She is scheduled for L TKR bu this has been postponed until her surgical wound infections have been illuminated.  ROS- has had fever and chills but has not taken her temp. Wound closed "because I had surgery" can't remember date. She continues to have pain. Belly button burns, stings and is irritated.   FHX- sudden death. SocHx- quit smoking 13 years ago, no ETOH. Staying with daughter.    Review of Systems  Gastrointestinal: Negative for diarrhea and constipation.  Genitourinary: Negative for dysuria.       Objective:   Physical Exam  Constitutional: She appears well-developed and well-nourished.  Non-toxic appearance. She does not have a sickly appearance.  Eyes: EOM are normal. Pupils are equal, round, and reactive to light.  Neck: Neck supple. No thyromegaly present.  Cardiovascular: Normal rate, regular rhythm and normal heart sounds.   Pulmonary/Chest: Effort normal and breath sounds normal.  Abdominal: Soft. Bowel sounds are normal. She exhibits distension. There is tenderness.    Musculoskeletal:       Feet:          Assessment & Plan:

## 2010-04-08 NOTE — Assessment & Plan Note (Signed)
The status of her wound is unclear. She is a very unclear historian, mixing dates easily. Her PMD is sending [place] records regarding her previous work up.Most importatnly, there is no record of the presence of a mesh and she cannot tell if she has one.  If she has had a recent (within the last 3 months) CT scan showing no inflammation and no mesh, I would favor treating her with bactrim bid for 2 weeks prior to her surgery, pre-operative bactroban (intranasal for 5 days bid for the first 5 days of each month, start 1 month prior to surgery, continue for 2 months after surgery) and chlorhexedine baths for the same time. I would also use these for 3 days immediately prior to her surgery.

## 2010-04-08 NOTE — Patient Instructions (Signed)
Take bactrim 1 tab twice daily for the 10 days prior to her surgery. Use mupirocin intranasally twice daily for the first 5 days of the month for one month prior to surgery and 3 months post surgery. Also use for the 5 days prior to surgery Use chlorhexidine scrub daily for 3 days prior to surgery and once weekly for one month prior to surgery and 3 months after surgery.

## 2010-04-26 LAB — COMPREHENSIVE METABOLIC PANEL
ALT: 13 U/L (ref 0–35)
AST: 20 U/L (ref 0–37)
CO2: 31 mEq/L (ref 19–32)
Chloride: 105 mEq/L (ref 96–112)
Creatinine, Ser: 0.94 mg/dL (ref 0.4–1.2)
GFR calc Af Amer: >60 mL/min (ref >60)
GFR calc non Af Amer: >60 mL/min (ref >60)
Total Bilirubin: 0.6 mg/dL (ref 0.3–1.2)

## 2010-04-26 LAB — DIFFERENTIAL
Basophils Absolute: 0.1 10*3/uL (ref 0.0–0.1)
Basophils Relative: 1 % (ref 0–1)
Eosinophils Absolute: 0.2 10*3/uL (ref 0.0–0.7)
Eosinophils Relative: 3 % (ref 0–5)
Lymphocytes Relative: 40 % (ref 12–46)

## 2010-04-26 LAB — URINALYSIS, ROUTINE W REFLEX MICROSCOPIC
Glucose, UA: NEGATIVE mg/dL
Hgb urine dipstick: NEGATIVE
Specific Gravity, Urine: >1.03 — ABNORMAL HIGH (ref 1.005–1.030)
Urobilinogen, UA: 0.2 mg/dL (ref 0.0–1.0)
pH: 6.5 (ref 5.0–8.0)

## 2010-04-26 LAB — CBC
MCV: 88.5 fL (ref 78.0–100.0)
RBC: 4.26 MIL/uL (ref 3.87–5.11)
WBC: 8.3 10*3/uL (ref 4.0–10.5)

## 2010-04-26 LAB — LIPASE, BLOOD: Lipase: 25 U/L (ref 11–59)

## 2010-04-28 LAB — COMPREHENSIVE METABOLIC PANEL
ALT: 11 U/L (ref 0–35)
Albumin: 3.8 g/dL (ref 3.5–5.2)
Alkaline Phosphatase: 85 U/L (ref 39–117)
GFR calc Af Amer: >60 mL/min (ref >60)
Potassium: 4.3 mEq/L (ref 3.5–5.1)
Sodium: 141 mEq/L (ref 135–145)
Total Protein: 6.4 g/dL (ref 6.0–8.3)

## 2010-04-28 LAB — CBC
Platelets: 157 10*3/uL (ref 150–400)
RDW: 14.6 % (ref 11.5–15.5)

## 2010-04-28 LAB — DIFFERENTIAL
Basophils Relative: 1 % (ref 0–1)
Eosinophils Absolute: 0.2 10*3/uL (ref 0.0–0.7)
Monocytes Absolute: 0.6 10*3/uL (ref 0.1–1.0)
Monocytes Relative: 8 % (ref 3–12)

## 2010-04-28 LAB — CALCIUM: Calcium: 9.7 mg/dL (ref 8.4–10.5)

## 2010-06-02 NOTE — H&P (Signed)
NAMEJUEL, [name]                  ACCOUNT NO.:  [id number]   MEDICAL RECORD NO.:  [id number]          PATIENT TYPE:  OIB   LOCATION:  5124                         FACILITY:  [facility]   PHYSICIAN:  Adolph Pollack, M.D.DATE OF BIRTH:  1945-12-29   DATE OF ADMISSION:  06/06/2008  DATE OF DISCHARGE:  05/28/2008                              HISTORY & PHYSICAL   REASON:  Elective neck exploration for hyperparathyroidism.   HISTORY:  Ms. Plant is a 65 year old female noted to have some  hypercalcemia.  She is also noted to have an elevation of her  parathyroid hormone level.  She had a nuclear medicine sestamibi scan,  which was suggestive of bilateral parathyroid adenomas.  Of note was  that her vitamin D level was extremely low.  Over time, we were able to  get her vitamin D level close to normal, but she continued to have  elevations of calcium and parathyroid hormone level.  Thus, she is now  admitted for neck exploration and parathyroidectomy.   PAST MEDICAL HISTORY:  1. Hypercalcemia.  2. Hyperparathyroidism.  3. Hypertension.  4. Morbid obesity.  5. Chronic anxiety.  6. Depression.  7. Gastroesophageal reflux disease.  8. Obstructive sleep apnea.  9. Hypercholesterolemia.  10.Osteoarthritis of the knees.  11.Vitamin B12 deficiency.   PREVIOUS OPERATIONS:  1. Tonsillectomy.  2. Hysterectomy.  3. Appendectomy.  4. Cesarean section.  5. Cholecystectomy.  6. Gastric bypass for morbid obesity.   ALLERGIES:  CODEINE.   MEDICATIONS:  Hydrocodone, Celexa, alprazolam, Nexium, multiple vitamin,  nasal spray.   SOCIAL HISTORY:  She has a history of tobacco use.   PHYSICAL EXAMINATION:  GENERAL:  An overweight female, in no acute  distress, pleasant, and cooperative.  SKIN:  Warm and dry without jaundice.  NECK:  Supple without masses.  RESPIRATORY:  Breath sounds equal and clear.  Respirations unlabored.  CARDIOVASCULAR:  Regular rate, regular rhythm with no murmur.  ABDOMEN:  Soft.  There is a widened midline scar with evidence of  infection.   IMPRESSION:  Primary hyperparathyroidism with sestamibi scan suggesting  bilateral parathyroid adenomas.   PLAN:  Neck exploration and parathyroidectomy.  The procedure and risks  were discussed with her preoperatively.      Adolph Pollack, M.D.  Electronically Signed     TJR/MEDQ  D:  06/06/2008  T:  06/07/2008  Job:  [id number]

## 2010-06-02 NOTE — Discharge Summary (Signed)
NAMEADRIANNE, SHACKLETON                  ACCOUNT NO.:  [id number]   MEDICAL RECORD NO.:  [id number]          PATIENT TYPE:  INP   LOCATION:  A339                          FACILITY:  [facility]   PHYSICIAN:  Mila Homer. Sudie Bailey, M.D.DATE OF BIRTH:  1945/08/27   DATE OF ADMISSION:  12/26/2007  DATE OF DISCHARGE:  12/14/2009LH                               DISCHARGE SUMMARY   This 65 year old was admitted to the hospital with an abdominal abscess.  She had a benign 7-day hospitalization extending from December 26, 2007,  to January 01, 2008.  Vital signs remained stable.   Admission white cell count is 8400, hemoglobin 11.7, platelet count  222,000.  Recheck 3 days later white cell count was 7600 and hemoglobin  10.6.  Admission BMET showed a glucose 110, BUN 8, creatinine 0.69.  Recheck BMET was essentially normal.  She had cultures of both her  abscess wound and her surgical incision.  Growth grew out methicillin-  resistant Staph aureus with a sensitive heavy pattern which looked like  community-acquired MRSA - MRSA was sensitive to sulfa, tetracycline,  resistant to levofloxacin, and also sensitive to vancomycin.   Her chest x-ray showed a PICC line in the SVC.   She was admitted to the hospital and started on IV of normal saline 75  mL an hour and vancomycin as per pharmacy.  She was continued on  Protonix 40 mg IV, given Lovenox 40 mg subcu daily, citalopram 40 mg  daily, Diovan/HCT at 160/25 daily, hydrocodone APAP 10/650 q.i.d. p.r.n.  pain, alprazolam 1 mg daily p.r.n. anxiety, temazepam 30 mg nightly  p.r.n. sleep, Zofran 4 mg IV q.4 h. p.r.n. nausea or vomiting.  Given  her elevated BMI, Lovenox was increased to 70 mg subcu q.24 h.  She  required K-Dur 20 mg b.i.d.  PICC line was placed.  IV was eventually  stopped.  She was put on a Hep-Lock, up on a chair, and she daily had  the nurses unroof the scab from her abscess drainage.  Gradually in the  course of the hospitalization, the  abscess cleared remarkably.  There  was still at least a 2-inch diameter area of induration deep to the  abscess.   For this reason on discharge, she is to keep her PICC line and receive  vancomycin daily at home at least for a week.  I talked to [facility]  about this and arrangements were made for that.  Nursing will also  unroof any scabs on her abscess.   FINAL DISCHARGE DIAGNOSES:  1. Abdominal wall abscess right lower quadrant secondary to      methicillin-resistant Staphylococcus aureus.  2. Methicillin-resistant Staphylococcus aureus infection of her      surgical incision.  3. Morbid obesity.  4. Status post gastric bypass.  5. Reactive depression.  6. Gastroesophageal reflux disease.  7. History of tobacco use (1966 to 1998).  8. Hiatal hernia.  9. Obstructive sleep apnea.  10.Benign essential hypertension.  11.B12 deficiency.  12.Lumbago.  13.Chronic anxiety.   She will continue with outpatient meds when she goes home and  these  include Celexa 40 mg daily, Diovan 160/25 daily, hydrocodone APAP 10/650  q.i.d. for pain, Prevacid 30 mg b.i.d., Allegra-D as needed for allergy,  alprazolam 1 mg least b.i.d.   Followup with me in the office within a week.  Again, discussed all this  at length with discharge planning and Home Health.      Mila Homer. Sudie Bailey, M.D.  Electronically Signed     SDK/MEDQ  D:  01/01/2008  T:  01/01/2008  Job:  [id number]

## 2010-06-02 NOTE — Assessment & Plan Note (Signed)
NAME:  [name], [name]                   CHART#:  [id number]   DATE:  02/29/2008                       DOB:  [birth year]   CHIEF COMPLAINT:  Severe abdominal pain, history of MRSA cellulitis,  status post gastric bypass.   SUBJECTIVE:  The patient is a 65 year old Caucasian female with history  of morbid obesity.  She underwent gastric bypass in [facility] in September 2009.  Status post her bypass, she developed  wound infection.  She had to be rehospitalized and she had a wound VAC  placed.  In December she was admitted to [facility] with  abdominal abscess which required a 7-day hospitalization for antibiotic  therapy.  On December 26, 2007, she had a positive culture for MRSA.  She  tells me her last dose of antibiotics was completed last month.  She has  been having a severe 8/10 constant abdominal pain.  She is taking  hydrocodone with good pain control, but still is having some  breakthrough symptoms.  Three days ago, she noticed a red spot on her  abdomen.  She did have a previous CT scan and ultrasound at [facility] back in November when she first had her wound  infection.  She has lost anywhere between 70 and 80 pounds since her  bypass.  She is having some nausea.  She has been vomiting  intermittently.  She denies any fever, has had some chills.  She has  history of chronic GERD.  Her last EGD was in March 2006 by Dr. Jena Gauss.  She had tiny distal esophageal erosions.  She had a colonoscopy with  benign polyps removed from mid descending colon at the same time.   CURRENT MEDICATIONS:  See the list from February 29, 2008.   ALLERGIES:  Codeine.   OBJECTIVE:  VITAL SIGNS:  Weight 236 pounds, height 66 inches,  temperature 97.9, blood pressure 124/88, and pulse 80.  GENERAL:  The patient is an obese Caucasian female, who is alert,  oriented, pleasant, and cooperative.  She appears pale to me.  HEENT:  Sclerae clear, nonicteric.   Conjunctivae pink.  Oropharynx pink  and moist without any lesions.  NECK:  Supple without mass or thyromegaly.  CHEST:  Heart, regular rate and rhythm.  Normal S1 and S2 without any  murmurs, clicks, rubs, or gallops.  ABDOMEN:  Protuberant.  She has a poorly healing midline vertical scar  which is erythematous.  She has a 2 x 3 cm  raised erythema, slightly  red erythema, annular lesion to just right below her umbilicus.  Abdomen  is soft.  She has generalized tenderness throughout her entire abdomen.  There is no rebound, tenderness, or guarding.  Unable to palpate  hepatosplenomegaly or mass given the patient's body habitus.  EXTREMITIES:  With 1+ pretibial edema bilaterally.   ASSESSMENT:  The patient is an 64 year old Caucasian female, who is  status post gastric bypass.  She has had methicillin-resistant  Staphylococcus aureus wound infection/cellulitis requiring  hospitalization since her procedure.  She appears to have a recurrent  cellulitis on her right abdomen currently that showed of about 3 days  ago.  She is also having severe abdominal pain.  She could have  anastomotic ulcer, she could have recurrence of her abscess from  her  previous surgical site, and she is going to require urgent evaluation.   PLAN:  1. Stat CT abdomen and pelvis with IV and oral contrast.  2. Stat CBC, LFTs, and MET-7.  3. She should remain on PPI.  She believes she is on thi, but did not      bring her medications with her today.  4. Pending review of blood work and CT.  She is more unlikely going to      need referral back to her surgeon at [facility].  5. I did review sensitivity and pending CT results, would consider      starting her on clindamycin 300 mg p.o. q.6 h. for 10 days.       [name], N.P.  Electronically Signed     R. Roetta Sessions, M.D.  Electronically Signed    KJ/MEDQ  D:  02/29/2008  T:  02/29/2008  Job:  [id number]   cc:   Mila Homer.  Sudie Bailey, M.D.

## 2010-06-02 NOTE — Group Therapy Note (Signed)
[name], [name]                  ACCOUNT NO.:  [id number]   MEDICAL RECORD NO.:  [id number]          PATIENT TYPE:  INP   LOCATION:  A339                          FACILITY:  [facility]   PHYSICIAN:  Mila Homer. Sudie Bailey, M.D.DATE OF BIRTH:  08/31/1945   DATE OF PROCEDURE:  DATE OF DISCHARGE:                                 PROGRESS NOTE   SUBJECTIVE:  She states she is having upper abdominal pain now.   OBJECTIVE:  VITAL SIGNS:  Temperature is 97.9, pulse 63, respiratory  rate 20, blood and pressure 113/53.  GENERAL:  She is supine in bed.  She appears to be in no acute distress.  She is well developed and morbidly obese.  Mom accompanies her in the  room.  O2 sats 95% on room air.  LUNGS:  Clear throughout.  No intercostal retractions.  No use of  accessory muscles for respiration.  HEART:  Regular rhythm at rate of 70.  SKIN:  Examination of her vertical surgical scar shows that it has  healthy appearing granulating tissue at the bottom of it.  The gaping  wound itself is about 6 inches vertically and about 0.5 inch wide.  The  abscess she has in her right lower quadrant is now down to about 1.5  inch in diameter, but the skin is scabbed at the abscess outlet.  There  are 1-2 other little pustules noted around this area.  There is some  tenderness on palpation of the epigastrium.   Her culture grew out moderate MRSA.  Sensitive to tetracycline, sulfa,  vancomycin, but resistant to levofloxacin.   ASSESSMENT:  1. Cellulitis and abscess of the right lower quadrant secondary to      methicillin-resistant Staphylococcus aureus.  2. Morbid obesity, status post gastric bypass.  3. Epigastric pain.   PLAN:  Continue the vancomycin IV.  I have personally instructed nursing  again on using sterile saline and 4x4s to gently tease off the scab over  the outlet from her abscess and to continue to try to decompress the  abscess.  We will make sure she is on a proton pump inhibitor.  Hopefully, discharge in 1-2 days which we can do on either doxycycline  or Bactrim DS.  I discussed at length with her.      Mila Homer. Sudie Bailey, M.D.  Electronically Signed     SDK/MEDQ  D:  12/30/2007  T:  12/30/2007  Job:  [id number]

## 2010-06-02 NOTE — H&P (Signed)
NAMEIRAN, KIEVIT                  ACCOUNT NO.:  [id number]   MEDICAL RECORD NO.:  [id number]          PATIENT TYPE:  INP   LOCATION:  A339                          FACILITY:  [facility]   PHYSICIAN:  Mila Homer. Sudie Bailey, M.D.DATE OF BIRTH:  1945/10/23   DATE OF ADMISSION:  12/26/2007  DATE OF DISCHARGE:  12/14/2009LH                              HISTORY & PHYSICAL   HISTORY:  This 65 year old began to develop erythema and tenderness in  the right lower abdominal wall starting 3-4 days prior to admission.  She was seen in the office yesterday and scab unroof culture taken of  the area under the scab and she was started on doxycycline 100 mg b.i.d.  with 2 doses yesterday, one this morning.  She feels she is worse with  extension of erythema overnight.   She had a bariatric surgery at [facility] several months ago.  She has had 2  hospitalizations at [facility] since then one for breakdown of the abdominal  wall incision and then secondary for more cellulitis.  Those records are  unavailable at this time.   CHRONIC MEDICAL PROBLEMS:  Morbid obesity, chronic anxiety,  osteoarthrosis of the knees, reactive depression, GERD, hiatal hernia,  obstructive sleep apnea, hypercholesterolemia, hypertension, vitamin B12  deficiency, and tobacco use disorder ([id number]).   MOST RECENT MEDICATIONS:  1. Diovan HCT 160/25 daily.  2. Alprazolam 1 mg usually just nightly.  3. Hydrocodone APAP 10/650 q.i.d. for pain.  4. Citalopram 40 mg daily for depression.  5. She has been on B12 injections.   Back this summer, she was felt to have possibly parathyroid adenoma, but  it turned out she had severe vitamin D deficiency, which responded to  high dose vitamin D.  She also has ongoing lumbago.   PHYSICAL EXAMINATION:  VITAL SIGNS:  Her weight in the office yesterday  243 pounds.  BP today is 118/80 and pulse 80.  She ambulates in the  office.  GENERAL:  She is well-developed and still morbidly obese, although 44  pounds lessened before her surgery.  She is in distress due to the  abdominal pain.  SKIN:  Turgor is normal.  HEENT:  Mucous membranes are moist.  HEART:  Regular rhythm, rate of 80.  LUNGS:  Clear throughout, moving air well.  ABDOMEN:  Soft except there is induration, erythema, an area extending  about 2 x 3 inches on the right lower quadrant abdominal wall.  The tiny  area of scab (about 3-4 mm diameter) I unroofed yesterday.  It is now  draining some.  There is green drainage on her 4 x 4s.  The area of  erythema now extends to the suprapubic region.  There is also a surgical  scar healing by secondary intention vertically noted in the midabdomen  between the xiphoid process and the umbilicus.  This is good granulation  tissue.  There is no edema of the ankles.  The area of erythema in the  right lower quadrant is very tender and quite red.  The induration  extends least 3 x 2 inches.   ADMISSION  DIAGNOSES:  1. Cellulitis of the right lower quadrant abdominal wall probably      secondary to methicillin-resistant Staphylococcus aureus.  I do not      know whether this is a community acquired or hospital acquired, but      since she has been at [facility] 3 times in the last several months, I      have got very concerned that it is hospital acquired.  She has not      responded to doxycycline as an outpatient, although this was only      started a day ago.  2. Morbid obesity.  3. Status post gastric bypass (we do not have the details of the exact      surgery).  4. Reactive depression.  5. Gastroesophageal reflux disease.  6. History of tobacco use [phone]  7. Hiatal hernia.  8. Obstructive sleep apnea.  9. Benign essential hypertension.  10.Hypercholesterolemia.  11.B12 deficiency.  12.Bilateral osteoarthrosis of the knees.  13.Lumbago.  14.Chronic anxiety.   PLAN:  Put her on the hospital, we will start her on vancomycin as per  pharmacy IV.  We will also put her on  Protonix 40 mg IV daily, start  Lovenox 40 mg subcu daily, continue citalopram 40 mg daily, Diovan HCT  160/25 daily, and hydrocodone APAP 10/650 q.i.d. p.r.n. pain.  She will  have warm compresses to the abdominal wall in the area of induration and  erythema.  I am asking nursing to culture the drainage on the abdominal  wall right lower quadrant and also the surgical wound.  She will be on  IV of normal saline 75 mL an hour, and she will have alprazolam 1 mg  q.i.d. p.r.n. anxiety with temazepam 30 mg nightly for sleep, Tylenol  for fever.  I discussed all this with the patient and her daughter.      Mila Homer. Sudie Bailey, M.D.  Electronically Signed     SDK/MEDQ  D:  12/26/2007  T:  12/27/2007  Job:  [id number]

## 2010-06-02 NOTE — Group Therapy Note (Signed)
[name], PICKING                  ACCOUNT NO.:  [id number]   MEDICAL RECORD NO.:  [id number]          PATIENT TYPE:  INP   LOCATION:  A339                          FACILITY:  [facility]   PHYSICIAN:  Mila Homer. Sudie Bailey, M.D.DATE OF BIRTH:  [birth year]   DATE OF PROCEDURE:  DATE OF DISCHARGE:                                 PROGRESS NOTE   Her abdomen feels somewhat better today.  She was admitted last night  for cellulitis of the right lower abdominal wall, and I felt this is  probably secondary to MRSA.  She was started on vancomycin.   OBJECTIVE:  Temperature 98.1, pulse 57, respiratory rate 20, blood  pressure was 102/57.  The area in the right lower abdominal wall shows a  little less induration, and there is less erythema around it.  She has a  dark, bloody discharge from the center of this, otherwise she seems  improved.  Heart has a regular rhythm with a rate of 70.  Lungs are  clear throughout, moving air well.  There is no edema in the ankles and  she in general, looks better.  O2 sat is 95% on room air.   Gram stain on lesion showed Gram positive cocci in pairs.   ASSESSMENT:  1. Cellulitis of the right lower quadrant abdominal wall.  2. Morbid obesity.  3. Status post gastric bypass.  4. Reactive depression.   PLAN:  Continue the vancomycin.  Culture pending.  If this turns out to  be community acquired, we will probably be able to send her home with  doxycycline, otherwise we will be using vancomycin outpatient through  clinics.      Mila Homer. Sudie Bailey, M.D.  Electronically Signed     SDK/MEDQ  D:  12/27/2007  T:  12/27/2007  Job:  [id number]

## 2010-06-02 NOTE — Telephone Encounter (Signed)
NAME:  [name], [name]                   CHART#:  [id number]   DATE:  06/22/2007                       DOB:  11/25/1945   CHIEF COMPLAINT:  Plans for gastric bypass.  Need barium swallow.   PHYSICIAN REQUESTING CONSULTATION:  Mila Homer. Sudie Bailey, M.D.   HISTORY OF PRESENT ILLNESS:  The patient is a 65 year old morbidly obese  Caucasian female who presents today for consideration of a barium  swallow as a preop requirement for upcoming gastric bypass surgery.  She  plans to have gastric bypass when she completes her preoperative  evaluation at [facility].  She was given a checklist of things  needed preoperatively, of which one includes the barium swallow.  She  has been seen previously by our practice, the last time in September  2007.  She has a history of chronic GERD which has been difficult to  manage.  She is currently on Prevacid 30 mg b.i.d., and continues to  have some intermittent breakthrough symptoms.  She notes they are  particularly related to certain foods.  She also notes nausea related to  tomato-based products, bananas, and peanut butter.  She does not vomit.  She denies dysphagia or odynophagia.  She complains of her epigastrium  being sore all the time.  She states it is not postprandial.  She says  she is having 2-3 bowel movements most days, but occasionally does have  some constipation.  She denies any blood in the stool.  She states she  also has to have a sleep apnea study as well as further evaluation of  parathyroid adenomas.   CURRENT MEDICATIONS:  The patient did not bring her medications today.  She is on:  1. Prevacid 30 mg twice a day.  2. Xanax.  3. A pain medication.  4. A blood pressure pill.  5. Something for anxiety and depression.   We are trying to obtain a copy of these from her pharmacy.   PAST MEDICAL HISTORY:  1. Hypertension.  2. Anxiety.  3. Depression.  4. Chronic GERD.  Her last EGD was in March 2006 by Dr. Jena Gauss.  She      had  tiny distal esophageal erosions.  She also had a colonoscopy at      that time which revealed polyps in the mid-descending colon which      were benign.  She is due for a follow-up colonoscopy on March 2016.      She has had a prior normal gastric emptying study.  She has had an      esophageal manometry, with intact esophageal body peristalsis.  Her      LES resting pressures were below normal, and an ambulatory pH study      revealed a DeMeester score of 27.2.  5. She had morbid obesity.  6. Degenerative joint disease.  7. Hyperlipidemia.  8. Hypertension.  9. Cholecystectomy.  10.Appendectomy.  11.Cesarean section.  12.Tonsillectomy.  13.Partial hysterectomy.  14.Right knee joint replacement over 10 years ago.  15.Surgery for a right wrist fracture, left foot fracture, and      fractured hip and pelvis, all due to an accident.   FAMILY HISTORY:  Her mother died in an MVA, age 27.  Father died with MI  at age 55.  Another sister died with MI.  One sister had ovarian cancer.  No family history of colorectal cancer, liver disease, or chronic GI  illnesses.   SOCIAL HISTORY:  She is in her second marriage.  She has four grown  healthy children.  She is disabled.  She has a history of remote tobacco  use.  No alcohol or drug use.   REVIEW OF SYSTEMS:  See HPI for GI.  Constitutional:  She has gained  another 8 pounds since we saw her in September 2007.  Cardiopulmonary:  She denies chest pain, shortness of breath, palpitations, or cough.  See  HPI for GI.  GU:  She denies dysuria or hematuria.  Musculoskeletal:  She complains of chronic left knee pain.   PHYSICAL EXAMINATION:  VITAL SIGNS:  Weight 292, height 5 feet 6 inches,  temperature 98.3, blood pressure 100/70, pulse 60.  GENERAL:  Pleasant morbidly obese Caucasian female in no acute distress.  SKIN:  Warm and dry.  No jaundice.  HEENT:  Sclerae anicteric.  Oropharyngeal mucosa moist and pink.  No  lymphadenopathy.   CHEST:  Lungs clear to auscultation.  CARDIAC:  Reveals regular rate and rhythm.  No murmurs, rubs, or gallops  appreciated.  ABDOMEN:  Obese.  Positive bowel sounds.  Abdomen is nontender.  No  organomegaly or masses are appreciated, but limited due to body habitus.  LOWER EXTREMITIES:  No edema.   IMPRESSION:  The patient is a 65 year old lady who is being evaluated  for possible gastric bypass.  She is undergoing preoperative workup.  We  have been provided with a checklist that indicates that she needs to  have a barium swallow preoperatively.  She also has chronic GERD and is  having breakthrough symptoms at this time.  She complains of her  epigastrium being sore at all times, but no postprandial component.  She  is already status post cholecystectomy.   PLAN:  1. Pursue barium esophagram with upper GI series.  She will continue      her Prevacid for now.  Further recommendations to follow regarding      refractory GERD, although we have told her that her symptoms should      dramatically improve with significant weight loss.  2. We will send a copy of the reports when we have them to [facility], address [address], [place], [place] [address].  Fax is [phone], and      phone is [phone].   I would like to thank Dr. Sudie Bailey for allowing [place] to take part in the  care of this patient.      Tana Coast, P.A.  Electronically Signed     R. Roetta Sessions, M.D.  Electronically Signed    LL/MEDQ  D:  06/22/2007  T:  06/22/2007  Job:  [id number]   cc:   Mila Homer. Sudie Bailey, M.D.

## 2010-06-02 NOTE — Group Therapy Note (Signed)
NAMEMARGALIT, LEECE                  ACCOUNT NO.:  [id number]   MEDICAL RECORD NO.:  [id number]          PATIENT TYPE:  INP   LOCATION:  A339                          FACILITY:  [facility]   PHYSICIAN:  Mila Homer. Sudie Bailey, M.D.DATE OF BIRTH:  [birth year]   DATE OF PROCEDURE:  DATE OF DISCHARGE:                                 PROGRESS NOTE   SUBJECTIVE:  The patient is feeling better today.  She noticed drainage  from her abscess.   OBJECTIVE:  Temperature 98.1, pulse 71, respiratory rate 20, blood  pressure 120/72.  She is supine in bed, appears to be in no acute  distress until I start pressing around her abscess cavity.  Cavities in  the right upper quadrant very erythematous, but less induration, more  doveyness, and about 2-inch diameter.  The very light erythema that was  surrounding this has cleared after 2 days of antibiotics.  Pressure  around the cavity causes a bloody pus to exude from the abscess hole.   She had two cultures on the wound while here and one did show moderate  Staphylococcus aureus.   ASSESSMENT:  1. Cellulitis of the right upper quadrant abdominal wall.  2. Staphylococcus aureus infection.  3. Probable methicillin-resistant Staphylococcus aureus, question      community versus hospital-acquired.  4. Morbid obesity.  5. Status post gastric bypass at [facility].  6. Reactive depression.  7. Benign essential hypertension.  8. Bilateral osteoarthrosis of the knees.   PLAN:  Continue with IV vancomycin.  Sensitivities on the Staphylococcus  are pending.  Add triclosan-containing soap.  Discussed with Nursing.      Mila Homer. Sudie Bailey, M.D.  Electronically Signed     SDK/MEDQ  D:  12/28/2007  T:  12/28/2007  Job:  [id number]

## 2010-06-02 NOTE — Group Therapy Note (Signed)
NAMEYERALDI, FIDLER                  ACCOUNT NO.:  [id number]   MEDICAL RECORD NO.:  [id number]          PATIENT TYPE:  INP   LOCATION:  A339                          FACILITY:  [facility]   PHYSICIAN:  Mila Homer. Sudie Bailey, M.D.DATE OF BIRTH:  [birth year]   DATE OF PROCEDURE:  DATE OF DISCHARGE:                                 PROGRESS NOTE   She is still feeling nauseated, but her stomach feels better.  I talked  to the nursing at length and they have been sedulously cleaning the area  of abscess, unroofing any scab that may be there, and decompressing the  abscess.   OBJECTIVE:  GENERAL:  The patient supine in bed, in no acute distress,  well developed and obese.  VITAL SIGNS:  The temperature is 98.1, the pulse 69, the respiratory  rate 20, and blood pressure 129/62.  O2 sat room air is 93%.  ABDOMEN:  In the right lower quadrant, she still has the abscess, which is now  about an inch and a half in diameter.  It is much flatter, much less  induration, and much less doughtiness.  My palpation around the sites  does create a flow out of it of a tomato soup-colored discharge, but  this is improved  compared to yesterday.  The vertical incision from her  surgery, which dehisced, looks good with good granulation tissue at the  base.  SKIN:  Turgor is normal.  HEENT:  Mucous membrane is moist.  NEUROLOGIC:  The patient is really alert, oriented, and nontoxic.   The cultures in the hospital also grow MRSA, really sensitive to  DOXYCYCLINE and SULFA as well as to VANCOMYCIN, confirming what we  already had from the office when she arrived 2 days ago.   ASSESSMENT:  1. Abscess/cellulitis of the right lower quadrant secondary to      methicillin-resistant Staphylococcus aureus.  2. Morbid obesity, status post gastric bypass.   PLAN:  Continue with the vancomycin IV in hospital.  Nursing is  continued to attend to her abscess cavity and keep it moist and draining  and once that abscess has  cleared up a little bit more, she is to be  able to be discharge home with home health to follow her.      Mila Homer. Sudie Bailey, M.D.  Electronically Signed     SDK/MEDQ  D:  12/31/2007  T:  12/31/2007  Job:  [id number]

## 2010-06-02 NOTE — Op Note (Signed)
NAMEKLOE, OATES                  ACCOUNT NO.:  [id number]   MEDICAL RECORD NO.:  [id number]          PATIENT TYPE:  OIB   LOCATION:  5124                         FACILITY:  [facility]   PHYSICIAN:  Adolph Pollack, M.D.DATE OF BIRTH:  [birth year]   DATE OF PROCEDURE:  06/06/2008  DATE OF DISCHARGE:                               OPERATIVE REPORT   PREOPERATIVE DIAGNOSIS:  Primary hyperparathyroidism.   POSTOPERATIVE DIAGNOSES:  1. Primary hyperparathyroidism.  2. Right thyroid follicular neoplasm.   PROCEDURES:  1. Neck exploration and left inferior parathyroidectomy.  2. Right thyroid lobectomy.   SURGEON:  Adolph Pollack, MD   ASSISTANT:  Velora Heckler, MD   ANESTHESIA:  General.   INDICATIONS:  Ms. Brahmbhatt is a 65 year old female with hypercalcemia  discovered also to have hyperparathyroidism.  The sestamibi scan  suggested that she may have bilateral adenomas.  She now presents for  neck exploration.   TECHNIQUE:  She was brought to the operating room, placed supine on the  operating table, and general anesthetic was administered.  Her neck was  placed in slight extension and upper chest were sterilely prepped and  draped.  Over the area of the cricothyroid membrane, a transverse  incision was made through the skin, subcutaneous tissue, and platysma.  Subplatysmal flaps were raised superiorly to the level of thyroid notch  and inferiorly to the suprasternal notch.  Following this, the deep  cervical fascia, separating through the strap muscles was identified and  divided.  I then used blunt dissection to free the left strap muscles  from the left lobe of the thyroid gland.  Beginning inferiorly, I  noticed number of veins that appeared to be tented up.  I carefully  dissected around these veins and found an enlarged structure, which  appeared to be consistent with the parathyroid adenoma.  I divided some  of the veins between clips to allow better exposure of the  adenoma.  I  was then able to pull the adenoma superiorly.  Using careful dissection,  I found its lateral and medial blood supply, it was clipped and divided  staying above the area of the recurrent laryngeal nerve.  This looked to  be approximately 6-10 times normal size of a normal parathyroid gland.  This was sent down for frozen section.   I was awaiting for frozen section results.  I explored the superior area  on the left side and did not find an enlarged parathyroid gland.  I then  approached the right lobe of the thyroid gland and separated the strap  muscles from the right lobe of the thyroid gland using blunt dissection.  Using dissection close to the thyroid gland, I identified right inferior  parathyroid gland, which appeared to be normal in size.  I also noted a  2-cm firm suspicious feeling nodule in the lower lobe on the right side.  At this time, frozen section confirmed that this was a parathyroid  tissue that was removed previously.  I explored the superior area on the  right side and did not notice enlarged parathyroid  gland.  However, I  was suspicious of this right thyroid nodule and was 2 cm in size.  Thus,  I decided for right thyroid lobectomy.   With dissection close to the superior lobe of the right thyroid gland, I  identified superior blood vessels and divided them between clips and  using harmonic scalpel.  I then identified the middle thyroidal veins  and divided these close to the thyroid gland.  The inferior thyroid  vessels were then divided with clips and the harmonic scalpel close to  the thyroid gland.  I used careful blunt dissection, began to medialized  the right lobe of the thyroid gland.  I identified the area where the  recurrent laryngeal nerve would be running and there was a loop of  thyroid tissue close to this.  Using electrocautery, I then began  dissecting the right lobe of the thyroid gland beneath the trachea and  left a small amount  of tissue close to recurrent laryngeal nerve so as  to avoid injuring it.  I then dissected the gland off the trachea.  I  used electrocautery to identify the isthmus.  Using the harmonic  scalpel, I divided the isthmus and thus freed up the right lobe of the  thyroid gland.  I marked the superior aspect of this lobe with a suture  and sent it to Pathology.  Frozen section came back consistent with a  follicular neoplasm and further study would have to be done prior to  final diagnosis.  I did not see any reason to proceed with completion  total thyroidectomy.   I then inspected both areas of the neck and the hemostasis was adequate.  Surgicel was placed in both the right and left sides of the neck where  the dissections had been performed.  The platysma muscles reapproximated  with interrupted 3-0 Vicryl sutures.  The skin was closed with a 4-0  Monocryl subcuticular stitch followed by Steri-Strips and sterile  dressing.   She tolerated the procedure well without any apparent complications and  was taken to recovery in satisfactory condition.      Adolph Pollack, M.D.  Electronically Signed     TJR/MEDQ  D:  06/06/2008  T:  06/07/2008  Job:  [id number]   cc:   Mila Homer. Sudie Bailey, M.D.

## 2010-06-02 NOTE — Assessment & Plan Note (Signed)
NAME:  [name], [name]                   CHART#:  [id number]   DATE:  03/06/2008                       DOB:  01-02-1946   PRIMARY CARE PHYSICIAN:  Mila Homer. Sudie Bailey, MD   CHIEF COMPLAINT:  Followup cellulitis and upper abdominal pain.   SUBJECTIVE:  The patient is a 65 year old Caucasian female.  She was  seen by me on 02/29/2008.  She has had upper abdominal pain since her  gastric bypass in September 2009.  Please see my note from 02/29/2008.  She continues to have upper abdominal pain.  She is not on PPI at this  time.  She thought she had a visit last week; however she is conveying  today that she has discontinued PPI.  She is taking a Carafate q.i.d.  and she did start the clindamycin 300 mg q.6 h. for 10 days that I gave  her last week for her cellulitis of her right abdomen.  On 02/29/2008,  she had a CT, which showed diffuse fatty infiltration of the liver,  minimal nonspecific stranding of the subcutaneous fat in the right mid  abdomen and no other upper abdominal or pelvic abnormalities.  She  continues to complain that she is not feeling well.  She has vomited  once.  Her bowel movements are normal 1-2 per day.  She denies rectal  bleeding or melena.  She had previously been on Aciphex, Prevacid, and  Protonix.  She is taking hydrocodone as needed for pain.  She denies any  fever or chills.  Her appetite is okay.  Her weight has remained stable.   CURRENT MEDICATIONS:  See at the list from March 06, 2008.   ALLERGIES:  Codeine.   OBJECTIVE:  VITAL SIGNS:  Weight 235 pounds, height 66 inches,  temperature 98.6, blood pressure 100/70, and pulse 80.  GENERAL:  She is an obese Caucasian female who is alert, oriented,  pleasant, cooperative, in no acute distress.  HEENT:  Sclerae clear, nonicteric.  Conjunctivae pink.  Oropharynx pink  and moist without any lesions.  CHEST:  Heart regular rate and rhythm.  Normal S1, S2.  ABDOMEN:  Protuberant.  She does have a  poor-appearing midline upper  abdominal scar, which is healing.  Right mid abdomen with discoloration,  but nowhere near is erythematous and fiery as it appeared last week.  It  appears to be healing.  Abdomen is soft, nondistended.  She does have  mild tenderness to her entire abdomen.  There is no rebound, tenderness,  or guarding.  EXTREMITIES:  Without edema.   ASSESSMENT:  The patient is a 65 year old Caucasian female.  She has a history of  gastric bypass with postop wound infection and methicillin-resistant  Staphylococcus aureus.  She has had a recurrent cellulitis to her right  mid abdomen, but she is responding well to clindamycin.  She has  continued upper abdominal pain.  Etiology could be an anastomotic ulcer.  She is going to need further evaluation.  I will discuss this case with  Dr. Jena Gauss and he would like her seen by a gastric bypass surgeon, Dr.  Melissa Noon at [facility]   PLAN:  1. Continue hydrocodone p.r.n. for pain.  2. Continue Carafate q.i.d.  3. Nexium 40 mg p.o. b.i.d.  I have given her 2 weeks' worth  of      samples and a prescription for 60 with 2 refills.  4. She is to continue clindamycin.  5. She is to follow up with both Dr. Sudie Bailey as well as her surgeon      at [facility] for further evaluation of her      upper abdominal pain, status post gastric bypass.       [name], N.P.  Electronically Signed     R. Roetta Sessions, M.D.  Electronically Signed    KJ/MEDQ  D:  03/07/2008  T:  03/07/2008  Job:  [id number]   cc:   Mila Homer. Sudie Bailey, M.D.  Dr. Melissa Noon

## 2010-06-02 NOTE — Group Therapy Note (Signed)
[name], [name]                  ACCOUNT NO.:  [id number]   MEDICAL RECORD NO.:  [id number]          PATIENT TYPE:  INP   LOCATION:  A339                          FACILITY:  [facility]   PHYSICIAN:  Mila Homer. Sudie Bailey, M.D.DATE OF BIRTH:  May 09, 1945   DATE OF PROCEDURE:  DATE OF DISCHARGE:                                 PROGRESS NOTE   SUBJECTIVE:  Feeling somewhat better, but she noted some pain in left  lower quadrant earlier.   OBJECTIVE:  VITAL SIGNS:  Temperature 98.2, pulse 62, respiratory rate  20, blood pressure 105/59.  O2 sat is 95% on room air.  GENERAL:  She is supine in bed.  No acute distress.  Well developed and  obese.  ABDOMEN:  Palpation of the left lower quadrant shows no tenderness, no  mass.  She has a well-healing vertical surgical incision between the  xiphoid process and umbilicus healing by secondary intention.  The area  of infection in the right lower quadrant looks better,  its diameter is  down probably by an inch and a half now, but there is no longer  discharge from the abscess cavity as it was yesterday.   White cell count today is 7600, hemoglobin 10.6.  BMP was normal.  The  culture from the office did grow a methicillin-resistant staph aureus.  Cultures from the hospital are pending.   ASSESSMENT:  1. Methicillin-resistant Staphylococcus aureus abscess of the right      lower quadrant of the abdomen.  2. Morbid obesity status post gastric bypass.   PLAN:  Continue vancomycin.  The sensitivities on the MRSA are still  pending.  Talked to nursing at length and instructed on how to continue  moist, warm compresses to the abscess, try to keep it draining.  Once  the abscess is cleared she will be able to be discharged for outpatient  treatment either p.o. or IV depending upon the sensitivities of the  MRSA.      Mila Homer. Sudie Bailey, M.D.  Electronically Signed     SDK/MEDQ  D:  12/29/2007  T:  12/29/2007  Job:  [id number]

## 2010-06-05 NOTE — Op Note (Signed)
NAME:  [name], [name]                  ACCOUNT NO.:  [id number]   MEDICAL RECORD NO.:  [id number]          PATIENT TYPE:  AMB   LOCATION:  DAY                           FACILITY:  [facility]   PHYSICIAN:  R. Roetta Sessions, M.D. DATE OF BIRTH:  1945/08/19   DATE OF PROCEDURE:  03/18/2004  DATE OF DISCHARGE:                                 OPERATIVE REPORT   PROCEDURE PERFORMED:  Diagnostic esophagogastroduodenoscopy followed by  colonoscopy with ablation and snare polypectomy.   INDICATIONS FOR PROCEDURE:  The patient is a 65 year old with refractory  gastroesophageal reflux symptoms.  Symptoms refractory to Prevacid 30 mg  orally twice daily.  She is here for screening colonoscopy as well.  This  approach has been discussed with the patient at length, potential risks,  benefits and alternatives have been reviewed and questions answered.  The  patient is agreeable.  Please see documentation in the medical record.   PROCEDURE NOTE:  Oxygen saturations, blood pressure, pulse and respirations  were monitored throughout the entirety of the procedure.   CONSCIOUS SEDATION:  Versed 8 mg IV, Demerol 200 mg IV in divided doses.   Subacute bacterial endocarditis prophylaxis was ampicillin 2 g IV,  gentamicin 120 mg IV prior to the procedures.   INSTRUMENT USED:  Olympus video chip system.   FINDINGS:  EGD:  Examination of the tubular esophagus revealed a couple of  tiny distal esophageal erosions.  Otherwise mucosa appeared normal.  Esophagogastric junction was easily traversed.   Stomach:  The gastric cavity was empty and insufflated well with air.  Thorough examination of the gastric mucosa including retroflex view of the  proximal stomach, esophagogastric junction demonstrated no abnormalities.  Pylorus was patent and easily traversed.  Examination of the bulb and second  portion revealed no abnormalities.   THERAPY/DIAGNOSTIC MANEUVERS PERFORMED:  None.  The patient tolerated the  procedure  well was prepared for colonoscopy.   Digital rectal exam revealed no abnormalities.   ENDOSCOPIC FINDINGS:  Prep was good.   Rectum:  Examination of the rectal mucosa including a retroflex view of the  anal verge revealed no abnormalities.   Colon:  The colonic mucosa was surveyed from the rectosigmoid junction to  the left transverse, right colon, to the area of the appendiceal orifice,  ileocecal valve and cecum.  These structures were well seen and photographed  for the record.  From this level, the scope was slowly withdrawn.  All  previously mentioned mucosal surfaces were again seen.  In the mid  descending colon there was a 3 mm diminutive polyp.  It was destroyed with  the tip of the snare cautery unit.  Adjacent to it was a 6 mm pedunculated  polyp which was engaged with a snare and removed via snare cautery and  recovered with the scope.  Remainder of the colonic mucosa appeared normal.   The patient tolerated the procedures well and was reacted in endoscopy.   IMPRESSION:  Esophagogastroduodenoscopy:  Tiny distal esophageal erosions  consistent with a mild erosive reflux esophagitis, otherwise normal  esophagus, normal stomach, normal D1,  D2.   Colonoscopy:  Normal rectum.  Polyps in the mid descending colon, treated as  described above.  Remainder of colonic mucosa appeared normal.   RECOMMENDATIONS:  1.  No aspirin or arthritis medications for 10 days.  2.  Continue Prevacid 30 mg orally twice daily before breakfast, before      supper.  Will add Reglan 5 mg a.c. (three times daily).  Patient  warned      about side effects with long term use.  Will plan to see this nice lady      back in one month to six weeks.  3.  I have encouraged continued weight loss.      RMR/MEDQ  D:  03/18/2004  T:  03/18/2004  Job:  [id number]   cc:   Vikki Ports, MD  [address]  [place]  [place] [address]

## 2010-06-05 NOTE — Cardiovascular Report (Signed)
[name], [name]                          ACCOUNT NO.:  [id number]   MEDICAL RECORD NO.:  [id number]                   PATIENT TYPE:  INP   LOCATION:  4728                                 FACILITY:  [facility]   PHYSICIAN:  Everardo Beals. Juanda Chance, M.D. [facility]           DATE OF BIRTH:  August 30, 1945   DATE OF PROCEDURE:  09/13/2001  DATE OF DISCHARGE:                              CARDIAC CATHETERIZATION   PROCEDURE PERFORMED:  Cardiac catheterization.   CLINICAL HISTORY:  The patient is 65 years old and has had history of  previous chest pain and was evaluated with catheterization three years ago  which showed minimal irregularities. She recently developed chest tightness  and was seen by Dr. Sudie Bailey and referred to Dr. Corinda Gubler, who was concerned  that her symptoms might represent unstable angina and arranged for her to be  admitted to [facility] for evaluation with angiography.   DESCRIPTION OF PROCEDURE:  The procedure was performed via the right femoral  artery using an arterial sheath and 6 French preformed coronary catheters.  A front wall arterial puncture was performed and Omnipaque contrast was  used. A distal aortogram was performed at the end of the procedure to rule  out abdominal aortic aneurysm.  The right femoral artery was closed with  Perclose at the end of the procedure.  The patient tolerated the procedure  well and left the laboratory in satisfactory condition.   RESULTS:  The aortic pressure was 172/87 with a mean of 122 and left  ventricular pressure was 172/34.   The left main coronary artery:  The left main coronary artery was free of  significant disease.   Left anterior descending:  The left anterior descending artery gave rise to  three diagonal branches and a septal perforator. These and the LAD proper  were free of significant disease.   Circumflex artery:  The circumflex artery gave rise to an intermediate  branch, an atrial branch and three posterolateral  branches.  These vessels  were free of significant disease.   Right coronary artery:  The right coronary is a moderate sized vessel that  gave rise to a large marginal branch which has three sub-branches. It then  gave rise to a small posterior descending and a small posterolateral branch.  These vessels were free of significant disease.   LEFT VENTRICULOGRAPHY:  The left ventriculogram performed in the RAO  projection showed good wall motion with no areas of hypokinesis.  The  estimated ejection fraction was 60%.   DISTAL AORTOGRAM:  A distal aortogram was performed which showed patent  renal arteries and no significant iliac obstruction.   CONCLUSION:  Normal coronary angiography and left ventricular wall motion.    RECOMMENDATIONS:  Reassurance. In view of these findings I think the  patient's symptoms are unlikely related to likely cardiac. She does have  known GERD and it is possible her symptoms could be related to  this. I will  review this with her and will plan to keep her tonight and discharge  tomorrow with followup with Dr. Sudie Bailey.                                                   Bruce Elvera Lennox Juanda Chance, M.D. [facility]    BRB/MEDQ  D:  09/13/2001  T:  09/15/2001  Job:  [id number]   cc:   Mila Homer. Sudie Bailey, M.D.  [address] [place], [place] [address]  Fax: [phone]   Cecil Cranker, M.D. [facility]   Cardiopulmonary Laboratory
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
## 2010-06-05 NOTE — Op Note (Signed)
NAMELEZLI, [name]                  ACCOUNT NO.:  [id number]   MEDICAL RECORD NO.:  [id number]          PATIENT TYPE:  AMB   LOCATION:  DAY                          [facility]ACILITY[facility]  [facility]   PHYSICIAN:  John L. Rendall, M.D.  DATE OF BIRTH:  September 18, 1945   DATE OF PROCEDURE:  02/28/2006  DATE OF DISCHARGE:                               OPERATIVE REPORT   INDICATION AND JUSTIFICATION FOR PROCEDURE:  History of chronic medial  left knee pain for about 8-9 months with MRI positive for torn medial  meniscus and osteoarthritis.  Justification for in-hospital scope, sleep  apnea, and obesity.   PREOPERATIVE DIAGNOSIS:  Torn left medial meniscus with osteoarthritis.   SURGICAL PROCEDURE:  Left medial meniscectomy and debridement.   POSTOPERATIVE DIAGNOSIS:  Torn left medial meniscus with osteoarthritis.   SURGEON:  Dr. Priscille Kluver.   ANESTHESIA:  General.   PROCEDURE:  Under general anesthesia, the left leg is prepared with  DuraPrep and draped as a sterile field using a lateral post.  Portals  are injected with Xylocaine 2% to control possible bleeding.  Diagnostic  arthroscopy reveals a significant posterior horn tear of the medial  meniscus with significant grade 3 and 4 changes on the medial femoral  condyle.  Using a curved Merlin shaver, a local chondroplasty is carried  out, smoothing peeling hyaline cartilage, and a combination of basket  forceps are then used to resect the posterior horn to stable medial  meniscus.  Once this is completed, the remainder of the joint is  carefully examined.  The patellofemoral articulation shows relatively  minimal damage, and the lateral meniscus is in good shape.  The knee is  then copiously irrigated with saline and infiltrated with Marcaine with  epinephrine, no morphine, and a sterile compression bandage is applied.  The patient returned to recovery in good condition.      John L. Rendall, M.D.  Electronically Signed     JLR/MEDQ  D:   02/28/2006  T:  02/28/2006  Job:  [id number]

## 2010-06-05 NOTE — H&P (Signed)
NAMESHEVA, MCDOUGLE [facility]                          ACCOUNT NO.:  [id number]   MEDICAL RECORD NO.:  [id number]                   PATIENT TYPE:  INP   LOCATION:  4728                                 FACILITY:  [facility]   PHYSICIAN:  Delton See, P.A. [facility]            DATE OF BIRTH:  [birth year]   DATE OF ADMISSION:  09/12/2001  DATE OF DISCHARGE:                                HISTORY & PHYSICAL   CHIEF COMPLAINT:  Chest pain.   HISTORY OF PRESENT ILLNESS:  This is a 65 year old female, referred to the  [facility] Office in [place] today by Dr. Sudie Bailey for evaluation of chest  pressure x1 week. The patient states approximately one week ago she  developed chest pain, which came on approximately one week ago, although she  has had similar symptoms in the past; however, they have not been as severe.  When asked about the pain, the patient makes a clinched fist and holds it  over her chest. She has previously had this type of discomfort while mowing  the lawn. It often occurs when she is tired and mainly with exertion.  She  usually rests and the pain resolves. The pain is substernal and feels like  someone standing on her chest associated with shortness of breath,  diaphoresis and nausea. As noted, it is relieved with rest. She is having  more severe pain now and it is occurring more frequently.  She is having  pain on a daily basis, although it has been intermittent. She did have pain  in the office today. Her ECG does show abnormalities as will be described  below.   PAST MEDICAL HISTORY:  The patient has a history of gastroesophageal reflux  disease.  She is status post tonsillectomy, appendectomy, cholecystectomy,  hysterectomy, and knee replacement surgery.  She does have hypotension.  She  does not have diabetes. Her cholesterol status is unknown. She smoked one-  pack of cigarettes per day for 30 years.  She quit 5 years ago. She is  overweight. She is sedentary.  She is status post  hysterectomy as noted.   FAMILY HISTORY:  The patient's father died at age 67 of MI. Her mother died  at age 30 of motor vehicle accident. She has two sisters, one sister has  hypertension, one sister had cancer and died of an MI in her 98s.   ALLERGIES:  CODEINE causes nausea.   CURRENT MEDICATIONS:  1. Prevacid 30 mg daily.  2. Paxil 20 mg daily.  3. Xanax 0.5 mg daily.  4. Darvocet p.r.n.   SOCIAL HISTORY:  The patient is married.  She lives in [place]. She has  four children.  She quit smoking 5 years ago. She does not use alcohol.  She  does not work outside the home.   REVIEW OF SYSTEMS:  Essentially negative except for the following:  The  patient has had dyspnea on exertion. She  has had recent weight loss  secondary to decreased appetite.  She has had these symptoms as described in  the history of present illness.  She has a history of anxiety and  depression. She has arthritis.   LABORATORY DATA:  ECG shows sinus bradycardia, rate 59 beats per minute with  T wave inversions V2 through V6.   PHYSICAL EXAMINATION:  GENERAL:  Physical examination reveals a 65 year old,  white female with a flat affect.  VITAL SIGNS:  Blood pressure 148/94, pulse 59, weight 284 pounds.  HEENT:  Unremarkable.  NECK:  Reveals no bruits. No jugular venous distention.  HEART:  Reveals regular rate and rhythm without murmur.  LUNGS:  Clear.  ABDOMEN:  Obese, soft, nontender.  EXTREMITY:  Reveal pulses to be weak but intact. There is no edema in the  lower extremities.   IMPRESSION:  1. Unstable angina.  2. Positive family history of coronary artery disease.  3. Probable hypertension.  4. History of tobacco use.  5. Obesity.  6. Status post hysterectomy.  7. History of gastroesophageal reflux disease.  8. Status post multiple surgeries.  9. History of anxiety and depression.   PLAN:  Following discussion with Dr. Corinda Gubler, a decision was made to admit  the patient to [facility] today to be treated with IV nitroglycerin  and Lovenox. We will also start aspirin. We will plan on a cardiac  catheterization tomorrow. We will also check a hemoglobin A1c, TSH, fasting  lipids and other baseline labs.                                                   Delton See, P.A. [facility]    DR/MEDQ  D:  09/12/2001  T:  09/13/2001  Job:  [id number]   cc:   Mila Homer. Sudie Bailey, M.D.  [address] [place], [place] [address]  Fax: [phone]

## 2010-06-05 NOTE — Procedures (Signed)
NAMEDAIRA, HINE NO.:  [id number]   MEDICAL RECORD NO.:  [id number]           PATIENT TYPE:   LOCATION:                                FACILITY:  [facility]   PHYSICIAN:  Vida Roller, M.D.   DATE OF BIRTH:  [birth year]   DATE OF PROCEDURE:  04/29/2004  DATE OF DISCHARGE:                                  ECHOCARDIOGRAM   PRIMARY:  Mila Homer. Sudie Bailey, M.D.   TAPE NUMBER:  [id number].   TABLE COUNT:  [id number].   HISTORY OF PRESENT ILLNESS:  This is a 65 year old woman with chest pain and  palpitations and a history of hypertension.   The technical quality of this study is adequate.   M-MODE TRACINGS:  The aorta is 31 mm.   The left atrium is 45 mm.   The septum is 16 mm.  The left ventricular posterior wall is 15 mm.   Left ventricular diastolic dimension 43 mm.   Left ventricular systolic dimension 29 mm.   2-D AND DOPPLER IMAGING:  The left ventricle is normal size with normal  systolic function.  Estimated ejection fraction 65-70%.  There are no  obvious wall motion abnormalities.  She has moderate concentric left  ventricular hypertrophy.   The right ventricle is top normal in size with normal systolic function.   Both atria are enlarged.   The aortic valve is sclerotic with no evidence of stenosis or regurgitation.   There is trace mitral regurgitation with a mildly myxomatous valve.   The tricuspid valve has trace regurgitation.  There is no pericardial  effusion, but she does have a mildly prominent pericardial fat pad.   The inferior vena cava appears to be normal size.   The ascending aorta is not well seen.      JH/MEDQ  D:  04/29/2004  T:  04/29/2004  Job:  [id number]   cc:   Mila Homer. Sudie Bailey, M.D.  [address] [place], [place] [address]  Fax: [phone]

## 2010-06-05 NOTE — Op Note (Signed)
NAME:  [name], [name]                            ACCOUNT NO.:  [id number]   MEDICAL RECORD NO.:  [id number]                   PATIENT TYPE:  AMB   LOCATION:  DAY                                  FACILITY:  [facility]   PHYSICIAN:  Tana Coast, P.A.                  DATE OF BIRTH:  [birth year]   DATE OF PROCEDURE:  DATE OF DISCHARGE:  04/12/2002                                 OPERATIVE REPORT   PROCEDURE:  Esophageal manometry.   INDICATIONS:  Chest pain/epigastric pain, gastroesophageal reflux disease  uncontrolled with Prevacid 30 mg b.i.d.   PRIOR STUDIES:  August 2003 EGD revealed a 2-to-3-cm hiatal hernia with  linear erosions, antral gastritis, performed by Dr. Lina Sar.  The  patient was treated for H. pylori.  She also had a cardiac catheterization  with angiography which revealed normal coronaries and LV function.  In  August 2003 she had a normal gastric emptying study.   METHOD:  The esophageal manometry study was performed using [employer]  Medical Gastrosoft Upper Gastrointestinal Polygram, version 6.40, using an  Arndorfer infusion system at 0.5 cc/min. The lower esophageal sphincter  (LES) was identified in four ports with the standard station pull through  technique. For esophageal body pressures, the tip of the catheter was placed  3 cm above the proximal aspect of the LES. Ten wet swallows were taken with  ports 5 cm apart in the esophageal body. This procedure was reviewed with  the patient prior to performing it.   FINDINGS:  Esophageal body amplitude (mmHg):  38.3 at 13 cm (normal 38-102),  65 at 8 cm (normal 49-131), 74.5 at 3 cm (normal 64-154), 69.7 mean (distal  two ports) (normal 59-139).   Duration (seconds):  3.3 at 13 cm (normal 3-5), 4.4 at 8 cm (normal 3-5),  4.7 at 3 cm (normal 2.5-3.5), 4.5 mean (distal two ports) (normal 3.5).   Contractions (with wet swallows):  60% peristaltic, 20% simultaneous, 20%  low amplitude.   Lower esophageal sphincter  (LES):   Located at 46-44 cm (from nares).  Resting pressure:  Average 4.3 mmHg.  Leads 2-3 with an average of 25 mmHg.  Relaxation:  Adequate.   IMPRESSION:  Good peristalsis seen. There were 20% low amplitude swallows  and 20% simultaneous swallows. 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  LES reading revealed average low resting pressure; in  leads  2 and 3 there was evidence of normal resting pressure, approximately 20-25  mmHg.  There was evidence of good LES relaxation.   RECOMMENDATION:  Please see ambulatory esophageal pH study.                                               Tana Coast, P.A.  LL/MEDQ  D:  04/23/2002  T:  04/23/2002  Job:  [id number]

## 2010-06-05 NOTE — Op Note (Signed)
[name], KIDDY                              ACCOUNT NO.:  [id number]   MEDICAL RECORD NO.:  [id number]                   PATIENT TYPE:  AMB   LOCATION:                                       FACILITY:  [facility]   PHYSICIAN:  Lionel December, M.D.                 DATE OF BIRTH:  03-09-1945   DATE OF PROCEDURE:  04/13/2002  DATE OF DISCHARGE:                                 OPERATIVE REPORT   PROCEDURE:  Ambulatory esophageal pH report.   INDICATIONS:  Please see esophageal manometry report.   PRIOR STUDIES:  Please see esophageal manometry report.   METHOD:  The pH electrodes were placed 20 and 5 cm above the proximal border  of the manometrically determined lower esophageal sphincter (LES). These  electrodes were defined as channels 1 and 2, respectively. The data was  converted using the [employer], version 2.30. A reflux  episode was defined as a drop in pH below 4.0. The procedure was reviewed  with the patient prior to performing it.   FINDINGS:  Proximal border of the LES (location from nares):  44 cm  Study duration:  24 hours  Channel 2 analysis:  Number of reflux episodes:  63  Number of reflux episodes longer than 5 minutes:  Equals 6.  Longest reflux episode: 12 minutes.  Total time pH below 4.0 (min):  129 minutes  Fraction of total time pH below 4.0:  Equals 9%.  DeMeester score (DeMeester normals <14.7 95th percentile):  27.2.   IMPRESSION:  The patient was taken Prevacid 30 mg b.i.d. up until 48 hours  prior to study.  During this 24-hour period time there was evidence of  significant pathological acid reflux seen.  The patient had a total of 63  episodes of acid reflux, 6 lasted longer than 5 minutes.  The longest reflux  episode lasted 12 minutes.  Her DeMeester score was 27.2.  The patient had  episodes of reflux primarily postprandially as well as during waking hours.  Notably when she was supine, there were limited numbers of reflux  episodes.   PLAN:  Study was reviewed by Dr. Karilyn Cota as well.  Given that the patient is  having significant acid reflux and is symptomatic despite PPI therapy,  consideration for antireflux surgery is necessary.  I discussed today with  the patient this possibility.  We will bring her back to the office to go  through all risks and benefits of antireflux surgery.  In the meantime we will switch her to Aciphex 20 mg b.i.d.  If this does not  control her symptoms as well as Prevacid, she will go back on Prevacid  taking 30 mg t.i.d.   RECOMMENDATION:  Further recommendations to follow.     Tana Coast, P.A.  Lionel December, M.D.    LL/MEDQ  D:  04/23/2002  T:  04/23/2002  Job:  [id number]   cc:   Mila Homer. Sudie Bailey, M.D.  [address] [place], [place] [address]  Fax: [phone]
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
## 2010-06-05 NOTE — Discharge Summary (Signed)
[name], DOFFING [facility]                          ACCOUNT NO.:  [id number]   MEDICAL RECORD NO.:  [id number]                   PATIENT TYPE:  INP   LOCATION:  4728                                 FACILITY:  [facility]   PHYSICIAN:  Pennelope Bracken, P.A. [facility]             DATE OF BIRTH:  24-Jul-1945   DATE OF ADMISSION:  09/12/2001  DATE OF DISCHARGE:  09/14/2001                                 DISCHARGE SUMMARY   INTERVENING CARDIOLOGIST:  Everardo Beals. Juanda Chance, M.D.   REASON FOR ADMISSION:  Chest pain.   DISCHARGE DIAGNOSES:  1. Chest pain, noncardiac.  Evaluated this admission with left heart     catheterization and angiography revealing normal coronaries and left     ventricular function  2. Dyslipidemia.  High total cholesterol, hypertriglyceridemia, high LDL     discovered this admission.  3. Hiatal hernia with erosion and gastritis, status post CLOtest, upper     endoscopy this admission performed by Dr. Lina Sar.  4. Obesity.  5. Hypertension.  6. Positive family history.  7. Tobacco history.  8. Status post hysterectomy.  9. Reflux disease.  10.      Anxiety/depression.   HISTORY OF PRESENT ILLNESS:  This delightful 65 year old lady came to our  attention for evaluation of some chest pressure she had been having for one  week.  This occurred first while the patient was driving, then subsequently  while she was mowing the lawn and occasionally when she gets tired.  This is  substernal in location and feels like someone standing on her chest.  There  is associated shortness of breath, diaphoresis, and nausea with it, and it  is relieved by rest.  Given these symptoms and her positive family history,  she was admitted for further evaluation and treatment.   HOSPITAL COURSE:  The patient was admitted and continued on her home  medications.  Serial labs were drawn, and cardiac enzymes were negative.  Her EKG on admission showed a marked sinus bradycardia with ventricular rate  of 47.   This was low voltage EKG with global T wave inversion.  Given her  presentation and the risk factors, she was scheduled for a cardiac  catheterization.  This was performed on September 13, 2001, by Dr. Juanda Chance with  findings as outlined above.  The patient's right femoral groin wound was  Perclosed, and she tolerated the procedure well.  She was seen on the day of  discharge by Dr. Juanda Chance, who felt that she needed a GI evaluation.  This was  performed by Dr. Lina Sar on day of discharge with findings as outlined  above.  She tolerated the endoscopy well.   PHYSICAL EXAMINATION:  At the point of discharge, the patient denied chest  pain or groin pain.  VITAL SIGNS:  140/75, 48, 20, 98.6, 96% on room air.  GENERAL:  Flat, NAD.  CARDIOVASCULAR:  Regular rate and rhythm,  S1, S2.  LUNGS:  Clear to auscultation.  EXTREMITIES:  No edema.  GROIN:  Nontender.  No evidence of hematoma or bruit.   DISCHARGE LABORATORY DATA:  Discharge hemogram:  WBC 6.9, hemoglobin 13.7,  hematocrit 41.0, platelets 180.  Chemistry:  Sodium 141, potassium 4.1, BUN  8, creatinine 0.9, glucose 52.  Hemoglobin A1C is 5.7, TSH 4.61.  Lipids are  abnormal.  The patient has a total cholesterol of 281, triglycerides at 192.  HDL was 54, and LDL was 189.  Liver panel is normal.  Results of upper GI  study were reducible 2-3 cm hiatal hernia, erosions seen in the left distal  esophagus, evidence of gastritis in the lower area of the stomach.   DISPOSITION:  The patient is discharged to home in the care of her husband.   DISCHARGE MEDICATIONS:  1. Prevacid 30 mg b.i.d. x 6-8 weeks.  2. Reglan 10 mg q.h.s. with 1/2 tab p.r.n. a.c.  3. Paxil 20 q.d.  4. Aspirin 325, 1 q.d.   ACTIVITY:  The patient is urged to walk for 30 minutes a day to improve  overall fitness and health.  No heavy lifting, driving, tub baths x 2 days.   DIET:  Low calorie weight loss diet.   DISCHARGE INSTRUCTIONS:  The patient agrees to call the  office if the groin  becomes hard or painful, and she will follow up with Dr. Sudie Bailey in two  weeks and agrees to call in the interim with any problems, questions. or  concerns.                                               Pennelope Bracken, P.A. [facility]    LK/MEDQ  D:  09/14/2001  T:  09/14/2001  Job:  [id number]   cc:   Mila Homer. Sudie Bailey, M.D.  [address] [place], [place] [address]  Fax: [phone]

## 2010-06-05 NOTE — Procedures (Signed)
NAMEVICENTA, [name]                  ACCOUNT NO.:  [id number]   MEDICAL RECORD NO.:  [id number]          PATIENT TYPE:  OUT   LOCATION:  SLEE                          FACILITY:  [facility]   PHYSICIAN:  Kofi A. Gerilyn Pilgrim, M.D. DATE OF BIRTH:  Jul 27, 1945   DATE OF PROCEDURE:  DATE OF DISCHARGE:  06/29/2007                             SLEEP DISORDER REPORT   NOCTURNAL POLYSOMNOGRAPHY REPORT   REFERRING PHYSICIAN:  Mila Homer. Sudie Bailey, MD.   INDICATIONS:  This is a 65 year old lady who has a previous study  showing obstructive sleep apnea syndrome.  This is therefore, a  titration study.   MEDICATIONS:  1. Pravastatin.  2. Prevacid.  3. Diovan/HCT.  4. Celexa.   Epworth sleepiness scale 5.  BMI 47.   SLEEP STAGE SUMMARY:  The total recording time is 355 minutes.  Sleep  efficiency 62%.  Sleep latency 54 minutes.  REM latency 0.  Stage N1  13%, N2 85%, slow-wave sleep 2%, and REM sleep 0.   RESPIRATORY SUMMARY:  This is a titrations study with the patient be  saturated between the pressure of 5-6.  The patient did well on both  pressures with optimal pressure being 6.  The baseline saturation is 96%  with a lowest saturation 90%.   LIMB MOVEMENT SUMMARY:  The patient did have an increased EMG activity  and fragmentary myoclonus/phasic EMG activity that can be seen sometimes  red.  REM sleep behavior disorder.  PLM index is recorded as 0.   ELECTROCARDIOGRAM SUMMARY:  Average heart rate is 94.  The patient had  no significant dysrhythmias.   IMPRESSION:  Obstructive sleep apnea which responded to a CPAP titration  pressure of 6.   Thanks for this referral.      Kofi A. Gerilyn Pilgrim, M.D.  Electronically Signed     KAD/MEDQ  D:  07/02/2007  T:  07/03/2007  Job:  [id number]

## 2010-06-05 NOTE — Procedures (Signed)
NAMEIYANNA, DRUMMER NO.:  [id number]   MEDICAL RECORD NO.:  [id number]           PATIENT TYPE:   LOCATION:                                 FACILITY:   PHYSICIAN:  Vida Roller, M.D.   DATE OF BIRTH:  1945/03/02   DATE OF PROCEDURE:  04/29/2004  DATE OF DISCHARGE:                                    STRESS TEST   HISTORY:  Ms. Holcomb is a 65 year old female with history of cardiac  catheterization in 2003 revealing normal coronary arteries, normal LV  function, and no wall motion abnormalities. Her cardiac risk factors include  family history, hypertension, and history of tobacco use. The patient now  with complaints of atypical chest discomfort and tachy palpitations.   Baseline data:  Electrocardiogram reveals sinus rhythm at 70 beats per  minute with nonspecific ST abnormalities. Blood pressure is 132/80.   Adenosine 60 mg was infused over a 4-minute protocol with Myoview injected  at 3 minutes. The patient reported chest tightness and tingling in her head  which resolved in recovery. EKG revealed no arrhythmias. No ischemic changes  noted.   Final images and results are pending MD review.      AB/MEDQ  D:  04/29/2004  T:  04/29/2004  Job:  [id number]

## 2010-06-18 ENCOUNTER — Inpatient Hospital Stay (HOSPITAL_COMMUNITY): Admission: RE | Admit: 2010-06-18 | Payer: Medicare Other | Source: Ambulatory Visit | Admitting: Orthopedic Surgery

## 2010-06-18 ENCOUNTER — Other Ambulatory Visit: Payer: Self-pay | Admitting: Neurosurgery

## 2010-06-18 DIAGNOSIS — M47812 Spondylosis without myelopathy or radiculopathy, cervical region: Secondary | ICD-10-CM

## 2010-06-19 ENCOUNTER — Emergency Department (HOSPITAL_COMMUNITY)
Admission: EM | Admit: 2010-06-19 | Discharge: 2010-06-19 | Disposition: A | Discharge status: Home / Self Care | Payer: Medicare Other | Attending: Emergency Medicine | Admitting: Emergency Medicine

## 2010-06-19 ENCOUNTER — Inpatient Hospital Stay: Admission: RE | Admit: 2010-06-19 | Payer: Medicare Other | Source: Ambulatory Visit

## 2010-06-19 DIAGNOSIS — I10 Essential (primary) hypertension: Secondary | ICD-10-CM | POA: Insufficient documentation

## 2010-06-19 DIAGNOSIS — R112 Nausea with vomiting, unspecified: Secondary | ICD-10-CM | POA: Insufficient documentation

## 2010-06-19 DIAGNOSIS — R109 Unspecified abdominal pain: Secondary | ICD-10-CM | POA: Insufficient documentation

## 2010-06-19 DIAGNOSIS — Z79899 Other long term (current) drug therapy: Secondary | ICD-10-CM | POA: Insufficient documentation

## 2010-06-22 ENCOUNTER — Ambulatory Visit
Admission: RE | Admit: 2010-06-22 | Discharge: 2010-06-22 | Disposition: A | Discharge status: Home / Self Care | Payer: Medicare Other | Source: Ambulatory Visit | Attending: Neurosurgery | Admitting: Neurosurgery

## 2010-06-22 DIAGNOSIS — M47812 Spondylosis without myelopathy or radiculopathy, cervical region: Secondary | ICD-10-CM

## 2010-08-04 ENCOUNTER — Ambulatory Visit (HOSPITAL_COMMUNITY)
Admission: RE | Admit: 2010-08-04 | Discharge: 2010-08-04 | Disposition: A | Discharge status: Home / Self Care | Payer: Medicare Other | Source: Ambulatory Visit | Attending: Urology | Admitting: Urology

## 2010-08-04 ENCOUNTER — Other Ambulatory Visit (HOSPITAL_COMMUNITY): Payer: Self-pay | Admitting: Urology

## 2010-08-04 DIAGNOSIS — N200 Calculus of kidney: Secondary | ICD-10-CM

## 2010-08-04 DIAGNOSIS — R109 Unspecified abdominal pain: Secondary | ICD-10-CM | POA: Insufficient documentation

## 2010-08-05 ENCOUNTER — Other Ambulatory Visit: Payer: Self-pay | Admitting: Orthopedic Surgery

## 2010-08-05 ENCOUNTER — Other Ambulatory Visit (HOSPITAL_COMMUNITY): Payer: Medicare Other

## 2010-08-05 ENCOUNTER — Encounter (HOSPITAL_COMMUNITY): Payer: Medicare Other

## 2010-08-05 ENCOUNTER — Other Ambulatory Visit (HOSPITAL_COMMUNITY): Payer: Self-pay | Admitting: Orthopedic Surgery

## 2010-08-05 ENCOUNTER — Ambulatory Visit (HOSPITAL_COMMUNITY)
Admission: RE | Admit: 2010-08-05 | Discharge: 2010-08-05 | Disposition: A | Discharge status: Home / Self Care | Payer: Medicare Other | Source: Ambulatory Visit | Attending: Orthopedic Surgery | Admitting: Orthopedic Surgery

## 2010-08-05 DIAGNOSIS — Z01811 Encounter for preprocedural respiratory examination: Secondary | ICD-10-CM

## 2010-08-05 DIAGNOSIS — Z01818 Encounter for other preprocedural examination: Secondary | ICD-10-CM | POA: Insufficient documentation

## 2010-08-05 DIAGNOSIS — M47814 Spondylosis without myelopathy or radiculopathy, thoracic region: Secondary | ICD-10-CM | POA: Insufficient documentation

## 2010-08-05 DIAGNOSIS — M1710 Unilateral primary osteoarthritis, unspecified knee: Secondary | ICD-10-CM | POA: Insufficient documentation

## 2010-08-05 DIAGNOSIS — M47817 Spondylosis without myelopathy or radiculopathy, lumbosacral region: Secondary | ICD-10-CM | POA: Insufficient documentation

## 2010-08-05 DIAGNOSIS — Z01812 Encounter for preprocedural laboratory examination: Secondary | ICD-10-CM | POA: Insufficient documentation

## 2010-08-05 LAB — DIFFERENTIAL
Basophils Absolute: 0.1 10*3/uL (ref 0.0–0.1)
Lymphocytes Relative: 37 % (ref 12–46)
Monocytes Absolute: 0.6 10*3/uL (ref 0.1–1.0)
Monocytes Relative: 8 % (ref 3–12)
Neutro Abs: 3.9 10*3/uL (ref 1.7–7.7)
Neutrophils Relative %: 53 % (ref 43–77)

## 2010-08-05 LAB — URINE MICROSCOPIC-ADD ON

## 2010-08-05 LAB — SURGICAL PCR SCREEN: Staphylococcus aureus: NEGATIVE

## 2010-08-05 LAB — COMPREHENSIVE METABOLIC PANEL
BUN: 21 mg/dL (ref 6–23)
CO2: 29 mEq/L (ref 19–32)
Calcium: 9.8 mg/dL (ref 8.4–10.5)
Chloride: 102 mEq/L (ref 96–112)
Creatinine, Ser: 0.91 mg/dL (ref 0.50–1.10)
GFR calc Af Amer: >60 mL/min (ref >60)
GFR calc non Af Amer: >60 mL/min (ref >60)
Glucose, Bld: 94 mg/dL (ref 70–99)
Total Bilirubin: 0.4 mg/dL (ref 0.3–1.2)

## 2010-08-05 LAB — URINALYSIS, ROUTINE W REFLEX MICROSCOPIC
Glucose, UA: NEGATIVE mg/dL
Hgb urine dipstick: NEGATIVE
Protein, ur: NEGATIVE mg/dL
Specific Gravity, Urine: 1.025 (ref 1.005–1.030)
pH: 6 (ref 5.0–8.0)

## 2010-08-05 LAB — CBC
HCT: 39.1 % (ref 36.0–46.0)
Hemoglobin: 12.2 g/dL (ref 12.0–15.0)
MCHC: 31.2 g/dL (ref 30.0–36.0)
RBC: 4.4 MIL/uL (ref 3.87–5.11)

## 2010-08-05 LAB — ABO/RH: ABO/RH(D): A POS

## 2010-08-13 ENCOUNTER — Inpatient Hospital Stay (HOSPITAL_COMMUNITY)
Admission: RE | Admit: 2010-08-13 | Discharge: 2010-08-16 | DRG: 470 | Disposition: A | Discharge status: Home Health | Payer: Medicare Other | Source: Ambulatory Visit | Attending: Orthopedic Surgery | Admitting: Orthopedic Surgery

## 2010-08-13 DIAGNOSIS — M1710 Unilateral primary osteoarthritis, unspecified knee: Principal | ICD-10-CM | POA: Diagnosis present

## 2010-08-13 DIAGNOSIS — Z01812 Encounter for preprocedural laboratory examination: Secondary | ICD-10-CM

## 2010-08-13 DIAGNOSIS — D62 Acute posthemorrhagic anemia: Secondary | ICD-10-CM | POA: Diagnosis not present

## 2010-08-13 DIAGNOSIS — Z8614 Personal history of Methicillin resistant Staphylococcus aureus infection: Secondary | ICD-10-CM

## 2010-08-13 DIAGNOSIS — G4733 Obstructive sleep apnea (adult) (pediatric): Secondary | ICD-10-CM | POA: Diagnosis present

## 2010-08-13 LAB — URINALYSIS, ROUTINE W REFLEX MICROSCOPIC
Ketones, ur: NEGATIVE mg/dL
Leukocytes, UA: NEGATIVE
Nitrite: NEGATIVE
Protein, ur: NEGATIVE mg/dL
pH: 6.5 (ref 5.0–8.0)

## 2010-08-13 NOTE — Op Note (Signed)
NAMEKYRSTIN, CAMPILLO                  ACCOUNT NO.:  [id number]  MEDICAL RECORD NO.:  [id number]  LOCATION:  0002                         FACILITY:  [facility]  PHYSICIAN:  Zygmund Passero L. Rendall, M.D.  DATE OF BIRTH:  06-04-1945  DATE OF PROCEDURE:  08/13/2010 DATE OF DISCHARGE:                              OPERATIVE REPORT   PREOPERATIVE DIAGNOSIS:  Osteoarthritis, left knee.  SURGICAL PROCEDURES:  Left LCS total knee arthroplasty with computer navigation assistance.  POSTOPERATIVE DIAGNOSIS:  Osteoarthritis, left knee.  SURGEON:  Velva Molinari L. Rendall, M.D.  ASSISTANT:  Legrand Pitts. Duffy, P.A.  ANESTHESIA:  Spinal with femoral nerve block.  PATHOLOGY:  The patient has a worn out knee with near bone against bone medially, unremitting pain despite conservative measures.  She has a total knee on the other side and wishes for one on the left.  At the time of computer navigation, she is in 5 degrees of varus with 4 degrees of fixed flexion.  PROCEDURE:  Under spinal anesthesia, the left leg was prepared with DuraPrep and draped as a sterile field.  It is wrapped out with an Esmarch and a sterile tourniquet is used at 350 mm.  Midline incision was made.  The patella was everted.  Multiple areas were debrided in preparation for computer mapping.  Schanz pins were placed in the superior medial tibia and distal femur.  The arrays were set up, the femoral heads identified in the medial and lateral malleoli.  Proximal tibia and distal femur are then mapped to less than 0.5 mm of reproducible accuracy.  At this point, the first computer guide is used and proximal tibial resection was carried out within 1 degree of anatomic alignment.  Using these spreaders and balancers, the collateral ligaments were stabilized to within 1 degree of longitudinal alignment. The flexion gap was then measured.  The computer guides were then used on the femur for the anterior and posterior flare of the distal femur with a  10-mm flexion gap.  The distal femoral cuts made for the extension gap.  Lamina spreader was then inserted and remnants of the menisci and cruciates and spurs off the back of the femoral condyle are removed.  Once the debris was removed, the recessing guide is inserted. At this point, the array is taken down as the array butts against the recessing guide, but all cuts had been made by the computer and alignment has been excellent.  The proximal tibia was then prepared.  It is a #3.  Center peg hole with keel is inserted, #10 bearing is inserted and the standard plus femoral component is tried.  Alignment appears excellent.  Range of motion is full but it is slightly lax to varus and valgus with slight hyperextension.  This was all corrected by going to a 12/5 bearing.  There is no hyperextension and stability in extension is excellent as it is in flexion.  Permanent components were then obtained. Pulse irrigation was used.  All components were cemented in place and excess cement was removed.  The patella, of course, has been osteotomized and three peg patella inserted.  While the cement hardened, a synovectomy was completed.  Medium  Hemovac is inserted.  The tourniquet is then let down at 70 minutes.  When the cement is hard, multiple small vessels were cauterized including geniculates.  At this point, the knee is then closed in layers with #1 Tycron, #1 Vicryl, 2-0 Vicryl and skin clips.  Operative time approximately 1 hour 25 minutes. The patient tolerated the procedure well and returned to recovery in good condition.  Blood loss less than 150 mL.     Javeria Briski L. Priscille Kluver, M.D.     Renato Gails  D:  08/13/2010  T:  08/13/2010  Job:  [id number]  Electronically Signed by Erasmo Leventhal M.D. on 08/13/2010 03:47:30 PM

## 2010-08-14 LAB — BASIC METABOLIC PANEL
BUN: 15 mg/dL (ref 6–23)
CO2: 30 mEq/L (ref 19–32)
Calcium: 8.4 mg/dL (ref 8.4–10.5)
Creatinine, Ser: 0.79 mg/dL (ref 0.50–1.10)
Glucose, Bld: 107 mg/dL — ABNORMAL HIGH (ref 70–99)

## 2010-08-14 LAB — CBC
Hemoglobin: 8.8 g/dL — ABNORMAL LOW (ref 12.0–15.0)
MCHC: 31.7 g/dL (ref 30.0–36.0)
Platelets: 147 10*3/uL — ABNORMAL LOW (ref 150–400)
RDW: 14.8 % (ref 11.5–15.5)

## 2010-08-14 LAB — URINE CULTURE
Colony Count: NO GROWTH
Culture: NO GROWTH

## 2010-08-15 LAB — BASIC METABOLIC PANEL
Calcium: 9 mg/dL (ref 8.4–10.5)
GFR calc Af Amer: >60 mL/min (ref >60)
GFR calc non Af Amer: >60 mL/min (ref >60)
Potassium: 4.3 mEq/L (ref 3.5–5.1)
Sodium: 134 mEq/L — ABNORMAL LOW (ref 135–145)

## 2010-08-15 LAB — CBC
Hemoglobin: 10.8 g/dL — ABNORMAL LOW (ref 12.0–15.0)
MCHC: 32 g/dL (ref 30.0–36.0)
Platelets: 152 10*3/uL (ref 150–400)
RBC: 3.83 MIL/uL — ABNORMAL LOW (ref 3.87–5.11)

## 2010-08-15 LAB — TYPE AND SCREEN
ABO/RH(D): A POS
Antibody Screen: NEGATIVE
Unit division: 0

## 2010-08-16 LAB — CBC
MCH: 29.3 pg (ref 26.0–34.0)
MCHC: 33.2 g/dL (ref 30.0–36.0)
MCV: 88.2 fL (ref 78.0–100.0)
Platelets: 135 10*3/uL — ABNORMAL LOW (ref 150–400)
RDW: 15.1 % (ref 11.5–15.5)
WBC: 9.2 10*3/uL (ref 4.0–10.5)

## 2010-09-18 NOTE — Discharge Summary (Signed)
[name], [name]                  ACCOUNT NO.:  [id number]  MEDICAL RECORD NO.:  [id number]  LOCATION:  1620                         FACILITY:  [facility]  PHYSICIAN:  John L. Rendall, M.D.  DATE OF BIRTH:  1945-10-31  DATE OF ADMISSION:  08/13/2010 DATE OF DISCHARGE:  08/16/2010                              DISCHARGE SUMMARY   ADMISSION DIAGNOSES: 1. End-stage osteoarthritis, left knee. 2. Degenerative disk disease, cervical spine. 3. Hypothyroidism. 4. History of methicillin-resistant Staphylococcus aureus in her     abdominal wound. 5. Peptic ulcer disease. 6. Gastroesophageal reflux disease. 7. Depression. 8. Sleep apnea. 9. Hypertension. 10.Allergies.  DISCHARGE DIAGNOSES: 1. End-stage osteoarthritis, left knee, status post left total knee     arthroplasty. 2. Acute blood loss anemia secondary to surgery. 3. History of methicillin-resistant Staphylococcus aureus in her     abdominal wound. 4. Degenerative disk disease, cervical spine. 5. Hypothyroidism. 6. Peptic ulcer disease. 7. Gastroesophageal reflux disease. 8. Depression. 9. Sleep apnea. 10.Hypertension. 11.Allergies.  SURGICAL PROCEDURES:  On August 13, 2010, [name] underwent a left total knee arthroplasty with computer navigation by Dr. Jonny Ruiz L.  Rendall and assisted by Arnoldo Morale, PA-C.  She had a DePuy primary femoral component cemented size standard plus left with an LCS complete metal back patella cemented size standard plus.  A tibial tray rotating platform MBT keel size 3 cemented with an LCS complete tibial insert rotating platform 12.5 mm thickness standard plus.  COMPLICATIONS:  None.  CONSULTS: 1. Physical therapy consult on August 14, 2010. 2. Occupational therapy consult on August 15, 2010.  HISTORY OF PRESENT ILLNESS:  This 65 year old white female patient presented to Dr. Priscille Kluver with a history of right total knee in 2000 and 4- to 5-year history of gradual onset of progressive left knee  pain. She has a history of a left knee scope in 2008.  The left knee pain is now a constant sharp to ache over the anterior joint with radiation up and down the leg.  Pain increases with walking and nothing makes it better.  The knee pops, grinds, locks, gives way, and keeps her up at night.  She has failed conservative treatment and because of that she is presenting for a left knee replacement.  HOSPITAL COURSE:  [name] tolerated her surgical procedure well without immediate postoperative complications.  On postop day #1, T-max was 98.6, hemoglobin 8.8, hematocrit 27.8.  Because of her history of MRSA, we had her doing chlorhexidine wipes to help decrease her colonization. She had done this preoperatively too with her low hemoglobin, it was felt she needed a transfusion and was transfused 2 units of packed red blood cells.  She was weaned off her oxygen, PCA was discontinued and she was started on therapy per protocol.  On postop day #2, she has moderate complaints of pain.  She was afebrile, vitals were stable.  Hemoglobin improved at 10.8, hematocrit 33.8, and she was doing well with therapy and plans were made for probable discharged home the next day.  On postop day #3, she continued to complain of pain, but the medicines were helping.  T-max was 100.4, hemoglobin 9.9, hematocrit 29.8, she  is doing well enough with therapy and her Foley was discontinued that day. She was able to void and after that she was able to be discharged home.  DISCHARGE INSTRUCTIONS: 1. Diet:  She is to resume her regular prehospitalization diet. 2. Medications:  Please see the patient med discharge instruction     sheet for complete documentation of her medications.  We did add     Robaxin, Xarelto, and  Dilaudid as medications.  Again, you can     find complete documentation of her medications on that discharge     instruction sheet. 3. Activity:  She can be weightbearing as tolerated on the left  leg     with use of a walker.  No lifting or driving for 6 weeks.  Please     see the white total joint discharge sheet for further activity     instructions. 4. Wound Care:  Please see the white total joint discharge sheet for     further wound care instructions. 5. Followup:  She is to follow up with Dr. Priscille Kluver in our office on     Monday, August 6th, and needs to call [phone] for that     appointment.  She is set up for home health care per [facility].  LABORATORY DATA:  Hemoglobin/hematocrit ranged from 8.8/27.8 on the July 27th to 9.9/29.8 on the 29th.  White count went from 6.5 on the 27th to 10.9 on the 28th to 9.2 on the 29th.  Platelets went from 147 on the 27th to 152 on the 28th to 135 on the 29th.  Glucose ranged from 107 on the 27th to 146 on the 28th.  Sodium went from 137 on the 27th to 134 on the 28th.  All other laboratory studies were within normal limits.     Legrand Pitts Duffy, P.A.   ______________________________ Carlisle Beers. Priscille Kluver, M.D.   KED/MEDQ  D:  09/09/2010  T:  09/09/2010  Job:  [id number]  Electronically Signed by Otilio Jefferson. on 09/10/2010 09:57:16 AM Electronically Signed by Erasmo Leventhal M.D. on 09/18/2010 07:26:01 AM

## 2010-10-23 LAB — BASIC METABOLIC PANEL
BUN: 10 mg/dL (ref 6–23)
BUN: 8 mg/dL (ref 6–23)
BUN: 9 mg/dL (ref 6–23)
Calcium: 10.1 mg/dL (ref 8.4–10.5)
Chloride: 108 mEq/L (ref 96–112)
Chloride: 110 mEq/L (ref 96–112)
Creatinine, Ser: 0.69 mg/dL (ref 0.4–1.2)
Creatinine, Ser: 0.73 mg/dL (ref 0.4–1.2)
Creatinine, Ser: 0.75 mg/dL (ref 0.4–1.2)
GFR calc Af Amer: >60 mL/min (ref >60)
GFR calc Af Amer: >60 mL/min (ref >60)
GFR calc non Af Amer: >60 mL/min (ref >60)
GFR calc non Af Amer: >60 mL/min (ref >60)
GFR calc non Af Amer: >60 mL/min (ref >60)
Glucose, Bld: 110 mg/dL — ABNORMAL HIGH (ref 70–99)
Potassium: 2.9 mEq/L — ABNORMAL LOW (ref 3.5–5.1)
Potassium: 4.6 mEq/L (ref 3.5–5.1)

## 2010-10-23 LAB — WOUND CULTURE

## 2010-10-23 LAB — DIFFERENTIAL
Basophils Absolute: 0 10*3/uL (ref 0.0–0.1)
Lymphocytes Relative: 22 % (ref 12–46)
Lymphocytes Relative: 38 % (ref 12–46)
Lymphs Abs: 1.9 10*3/uL (ref 0.7–4.0)
Lymphs Abs: 2.9 10*3/uL (ref 0.7–4.0)
Monocytes Relative: 7 % (ref 3–12)
Neutro Abs: 3.7 10*3/uL (ref 1.7–7.7)
Neutro Abs: 5.5 10*3/uL (ref 1.7–7.7)
Neutrophils Relative %: 50 % (ref 43–77)
Neutrophils Relative %: 66 % (ref 43–77)

## 2010-10-23 LAB — CBC
Platelets: 222 10*3/uL (ref 150–400)
Platelets: 243 10*3/uL (ref 150–400)
RBC: 3.54 MIL/uL — ABNORMAL LOW (ref 3.87–5.11)
RDW: 15.7 % — ABNORMAL HIGH (ref 11.5–15.5)
WBC: 7.6 10*3/uL (ref 4.0–10.5)
WBC: 8.4 10*3/uL (ref 4.0–10.5)

## 2010-10-23 LAB — VANCOMYCIN, TROUGH
Vancomycin Tr: 26.8 ug/mL (ref 10.0–20.0)
Vancomycin Tr: 32.5 ug/mL (ref 10.0–20.0)

## 2010-11-30 ENCOUNTER — Encounter (HOSPITAL_COMMUNITY): Payer: Self-pay | Admitting: Pharmacy Technician

## 2010-12-02 ENCOUNTER — Encounter (HOSPITAL_COMMUNITY)
Admission: RE | Admit: 2010-12-02 | Discharge: 2010-12-02 | Disposition: A | Discharge status: Home / Self Care | Payer: Medicare Other | Source: Ambulatory Visit | Attending: Neurosurgery | Admitting: Neurosurgery

## 2010-12-02 ENCOUNTER — Encounter (HOSPITAL_COMMUNITY): Payer: Self-pay

## 2010-12-02 HISTORY — DX: Encounter for other specified aftercare: Z51.89

## 2010-12-02 HISTORY — DX: Acute upper respiratory infection, unspecified: J06.9

## 2010-12-02 HISTORY — DX: Hypothyroidism, unspecified: E03.9

## 2010-12-02 HISTORY — DX: Diaphragmatic hernia without obstruction or gangrene: K44.9

## 2010-12-02 HISTORY — DX: Sleep apnea, unspecified: G47.30

## 2010-12-02 HISTORY — DX: Neuralgia and neuritis, unspecified: M79.2

## 2010-12-02 HISTORY — DX: Nausea with vomiting, unspecified: R11.2

## 2010-12-02 HISTORY — DX: Other specified postprocedural states: Z98.890

## 2010-12-02 LAB — BASIC METABOLIC PANEL
BUN: 21 mg/dL (ref 6–23)
CO2: 31 mEq/L (ref 19–32)
GFR calc non Af Amer: 71 mL/min — ABNORMAL LOW (ref >90)
Glucose, Bld: 84 mg/dL (ref 70–99)
Potassium: 4.2 mEq/L (ref 3.5–5.1)

## 2010-12-02 LAB — CBC
HCT: 38 % (ref 36.0–46.0)
Hemoglobin: 12 g/dL (ref 12.0–15.0)
MCHC: 31.6 g/dL (ref 30.0–36.0)
RBC: 4.18 MIL/uL (ref 3.87–5.11)

## 2010-12-02 MED ORDER — CEFAZOLIN SODIUM 1-5 GM-% IV SOLN: 1.0000 g | INTRAVENOUS | Status: DC

## 2010-12-02 NOTE — Progress Notes (Signed)
Call to [facility] Med. Records  for sleep study or stress echo, spoke with Bonita Quin.

## 2010-12-02 NOTE — Progress Notes (Signed)
Call to Dr. Michelle Nasuti office, requested last ov note & stress/echo, spoke /w Tresa Endo.  No stress test or sleep study available.

## 2010-12-02 NOTE — Pre-Procedure Instructions (Signed)
20 [name]  12/02/2010   Your procedure is scheduled on:  12/09/2010  Report to [facility] at [phone].  Call this number if you have problems the morning of surgery: [phone]   Remember:   Do not eat food:After Midnight.  Do not drink clear liquids: 4 Hours before arrival.  Take these medicines the morning of surgery with A SIP OF WATER: nexium, celexa, pain med. If needed , levothyroxine, xanax   Do not wear jewelry, make-up or nail polish.  Do not wear lotions, powders, or perfumes. You may wear deodorant.  Do not shave 48 hours prior to surgery.  Do not bring valuables to the hospital.  Contacts, dentures or bridgework may not be worn into surgery.  Leave suitcase in the car. After surgery it may be brought to your room.  For patients admitted to the hospital, checkout time is 11:00 AM the day of discharge.   Patients discharged the day of surgery will not be allowed to drive home.  Name and phone number of your driver:   Special Instructions: CHG Shower Use Special Wash: 1/2 bottle night before surgery and 1/2 bottle morning of surgery.   Please read over the following fact sheets that you were given: Pain Booklet, Coughing and Deep Breathing, MRSA Information and Surgical Site Infection Prevention

## 2010-12-03 ENCOUNTER — Encounter (HOSPITAL_COMMUNITY): Payer: Self-pay | Admitting: *Deleted

## 2010-12-03 NOTE — Consult Note (Signed)
Anesthesia:  Patient is a 65 year old female for ACDF.  Hx of anxiety/depression, morbid obesity, asthma, GERD/Hiatal hernia, CKD, hypothyrodism, and OSA.  I was asked to review her preoperative EKG, done 02/17/10, showing NSR with inferolateral T wave abnormality.  She underwent a Stress Echo at [facility] (under 01/29/10 Procedure in Epic) showing no evidence of stress-induced ischemia.  EF normal at rest and with stress.  She went on to have a left TKA in July.  Since with recent normal stress echo, will plan to proceed if remains asymptomatic.  Labs and CXR noted.

## 2010-12-09 ENCOUNTER — Encounter (HOSPITAL_COMMUNITY): Payer: Self-pay | Admitting: Vascular Surgery

## 2010-12-09 ENCOUNTER — Encounter (HOSPITAL_COMMUNITY): Payer: Self-pay | Admitting: *Deleted

## 2010-12-09 ENCOUNTER — Inpatient Hospital Stay (HOSPITAL_COMMUNITY): Payer: Medicare Other | Admitting: Vascular Surgery

## 2010-12-09 ENCOUNTER — Encounter (HOSPITAL_COMMUNITY)
Admission: RE | Disposition: A | Discharge status: Home / Self Care | Payer: Self-pay | Source: Ambulatory Visit | Attending: Neurosurgery

## 2010-12-09 ENCOUNTER — Inpatient Hospital Stay (HOSPITAL_COMMUNITY)
Admission: RE | Admit: 2010-12-09 | Discharge: 2010-12-14 | DRG: 472 | Disposition: A | Discharge status: Home / Self Care | Payer: Medicare Other | Source: Ambulatory Visit | Attending: Neurosurgery | Admitting: Neurosurgery

## 2010-12-09 ENCOUNTER — Inpatient Hospital Stay (HOSPITAL_COMMUNITY): Payer: Medicare Other

## 2010-12-09 DIAGNOSIS — R1310 Dysphagia, unspecified: Secondary | ICD-10-CM | POA: Diagnosis present

## 2010-12-09 DIAGNOSIS — D649 Anemia, unspecified: Secondary | ICD-10-CM | POA: Diagnosis present

## 2010-12-09 DIAGNOSIS — E538 Deficiency of other specified B group vitamins: Secondary | ICD-10-CM | POA: Diagnosis present

## 2010-12-09 DIAGNOSIS — F341 Dysthymic disorder: Secondary | ICD-10-CM | POA: Diagnosis present

## 2010-12-09 DIAGNOSIS — M4712 Other spondylosis with myelopathy, cervical region: Principal | ICD-10-CM | POA: Diagnosis present

## 2010-12-09 DIAGNOSIS — Z9884 Bariatric surgery status: Secondary | ICD-10-CM

## 2010-12-09 DIAGNOSIS — M5000 Cervical disc disorder with myelopathy, unspecified cervical region: Secondary | ICD-10-CM | POA: Diagnosis present

## 2010-12-09 DIAGNOSIS — G959 Disease of spinal cord, unspecified: Secondary | ICD-10-CM

## 2010-12-09 DIAGNOSIS — E7800 Pure hypercholesterolemia, unspecified: Secondary | ICD-10-CM | POA: Diagnosis present

## 2010-12-09 DIAGNOSIS — K219 Gastro-esophageal reflux disease without esophagitis: Secondary | ICD-10-CM | POA: Diagnosis present

## 2010-12-09 DIAGNOSIS — E039 Hypothyroidism, unspecified: Secondary | ICD-10-CM | POA: Diagnosis present

## 2010-12-09 DIAGNOSIS — I10 Essential (primary) hypertension: Secondary | ICD-10-CM | POA: Diagnosis present

## 2010-12-09 HISTORY — PX: ANTERIOR CERVICAL DECOMP/DISCECTOMY FUSION: SHX1161

## 2010-12-09 SURGERY — ANTERIOR CERVICAL DECOMPRESSION/DISCECTOMY FUSION 3 LEVELS
Anesthesia: General | Site: Spine Cervical | Wound class: Clean

## 2010-12-09 MED ORDER — PROMETHAZINE HCL 25 MG PO TABS
25.0000 mg | ORAL_TABLET | ORAL | Status: DC | PRN
  Administered 2010-12-09 – 2010-12-14 (×3): 25 mg via ORAL
  Filled 2010-12-09 (×3): qty 1

## 2010-12-09 MED ORDER — NEOSTIGMINE METHYLSULFATE 1 MG/ML IJ SOLN
INTRAMUSCULAR | Status: DC | PRN
  Administered 2010-12-09: 3 mg via INTRAVENOUS

## 2010-12-09 MED ORDER — TRAZODONE HCL 50 MG PO TABS
50.0000 mg | ORAL_TABLET | Freq: Every day | ORAL | Status: DC
  Administered 2010-12-11 – 2010-12-13 (×3): 50 mg via ORAL
  Filled 2010-12-09 (×6): qty 1

## 2010-12-09 MED ORDER — SODIUM CHLORIDE 0.9 % IJ SOLN: 3.0000 mL | INTRAMUSCULAR | Status: DC | PRN

## 2010-12-09 MED ORDER — ROCURONIUM BROMIDE 100 MG/10ML IV SOLN
INTRAVENOUS | Status: DC | PRN
  Administered 2010-12-09: 30 mg via INTRAVENOUS
  Administered 2010-12-09 (×6): 10 mg via INTRAVENOUS

## 2010-12-09 MED ORDER — DEXAMETHASONE SODIUM PHOSPHATE 4 MG/ML IJ SOLN
4.0000 mg | Freq: Four times a day (QID) | INTRAMUSCULAR | Status: AC
  Administered 2010-12-10 (×2): 4 mg via INTRAVENOUS
  Filled 2010-12-09 (×4): qty 1

## 2010-12-09 MED ORDER — MENTHOL 3 MG MT LOZG: 1.0000 | LOZENGE | OROMUCOSAL | Status: DC | PRN

## 2010-12-09 MED ORDER — OXYCODONE-ACETAMINOPHEN 5-325 MG PO TABS
1.0000 | ORAL_TABLET | ORAL | Status: DC | PRN
  Administered 2010-12-09: 1 via ORAL
  Administered 2010-12-10 (×2): 2 via ORAL
  Administered 2010-12-10 (×3): 1 via ORAL
  Administered 2010-12-11 – 2010-12-14 (×10): 2 via ORAL
  Filled 2010-12-09: qty 2
  Filled 2010-12-09: qty 1
  Filled 2010-12-09: qty 2
  Filled 2010-12-09: qty 1
  Filled 2010-12-09: qty 2
  Filled 2010-12-09: qty 1
  Filled 2010-12-09 (×5): qty 2
  Filled 2010-12-09: qty 1
  Filled 2010-12-09 (×2): qty 2
  Filled 2010-12-09: qty 1
  Filled 2010-12-09 (×2): qty 2
  Filled 2010-12-09: qty 1

## 2010-12-09 MED ORDER — PANTOPRAZOLE SODIUM 40 MG PO TBEC
40.0000 mg | DELAYED_RELEASE_TABLET | Freq: Every day | ORAL | Status: DC
  Administered 2010-12-09 – 2010-12-14 (×6): 40 mg via ORAL
  Filled 2010-12-09 (×6): qty 1

## 2010-12-09 MED ORDER — HYDROMORPHONE HCL PF 1 MG/ML IJ SOLN
0.2500 mg | INTRAMUSCULAR | Status: DC | PRN
  Administered 2010-12-09 (×2): 0.5 mg via INTRAVENOUS

## 2010-12-09 MED ORDER — CEFAZOLIN SODIUM 1-5 GM-% IV SOLN
1.0000 g | Freq: Three times a day (TID) | INTRAVENOUS | Status: AC
  Administered 2010-12-09 – 2010-12-10 (×2): 1 g via INTRAVENOUS
  Filled 2010-12-09 (×3): qty 50

## 2010-12-09 MED ORDER — CITALOPRAM HYDROBROMIDE 10 MG PO TABS
10.0000 mg | ORAL_TABLET | Freq: Every day | ORAL | Status: DC
  Administered 2010-12-09 – 2010-12-14 (×6): 10 mg via ORAL
  Filled 2010-12-09 (×6): qty 1

## 2010-12-09 MED ORDER — LACTATED RINGERS IV SOLN
INTRAVENOUS | Status: DC | PRN
  Administered 2010-12-09 (×3): via INTRAVENOUS

## 2010-12-09 MED ORDER — ONDANSETRON HCL 4 MG/2ML IJ SOLN
INTRAMUSCULAR | Status: AC
  Administered 2010-12-09: 4 mg via INTRAVENOUS
  Filled 2010-12-09: qty 2

## 2010-12-09 MED ORDER — DOXYCYCLINE HYCLATE 100 MG PO CAPS
100.0000 mg | ORAL_CAPSULE | Freq: Two times a day (BID) | ORAL | Status: DC
  Administered 2010-12-09 – 2010-12-11 (×3): 100 mg via ORAL
  Filled 2010-12-09 (×6): qty 1

## 2010-12-09 MED ORDER — ACETAMINOPHEN 10 MG/ML IV SOLN
INTRAVENOUS | Status: DC | PRN
  Administered 2010-12-09: 1000 mg via INTRAVENOUS

## 2010-12-09 MED ORDER — PROMETHAZINE HCL 25 MG/ML IJ SOLN: 6.2500 mg | INTRAMUSCULAR | Status: DC | PRN

## 2010-12-09 MED ORDER — MORPHINE SULFATE 2 MG/ML IJ SOLN
INTRAMUSCULAR | Status: AC
  Administered 2010-12-09: 2 mg via INTRAVENOUS
  Filled 2010-12-09: qty 1

## 2010-12-09 MED ORDER — ALPRAZOLAM 0.5 MG PO TABS
1.0000 mg | ORAL_TABLET | Freq: Three times a day (TID) | ORAL | Status: DC
  Administered 2010-12-09 – 2010-12-14 (×13): 1 mg via ORAL
  Filled 2010-12-09 (×2): qty 1
  Filled 2010-12-09: qty 2
  Filled 2010-12-09: qty 1
  Filled 2010-12-09 (×3): qty 2
  Filled 2010-12-09 (×5): qty 1
  Filled 2010-12-09 (×4): qty 2
  Filled 2010-12-09: qty 1

## 2010-12-09 MED ORDER — BACITRACIN ZINC 500 UNIT/GM EX OINT
TOPICAL_OINTMENT | CUTANEOUS | Status: DC | PRN
  Administered 2010-12-09: 1 via TOPICAL

## 2010-12-09 MED ORDER — ONDANSETRON HCL 4 MG/2ML IJ SOLN
INTRAMUSCULAR | Status: DC | PRN
  Administered 2010-12-09: 4 mg via INTRAVENOUS

## 2010-12-09 MED ORDER — SODIUM CHLORIDE 0.9 % IJ SOLN
3.0000 mL | Freq: Two times a day (BID) | INTRAMUSCULAR | Status: DC
  Administered 2010-12-09 – 2010-12-14 (×9): 3 mL via INTRAVENOUS

## 2010-12-09 MED ORDER — FENTANYL CITRATE 0.05 MG/ML IJ SOLN
INTRAMUSCULAR | Status: DC | PRN
  Administered 2010-12-09: 150 ug via INTRAVENOUS
  Administered 2010-12-09: 100 ug via INTRAVENOUS

## 2010-12-09 MED ORDER — SCOPOLAMINE 1 MG/3DAYS TD PT72
1.0000 | MEDICATED_PATCH | TRANSDERMAL | Status: DC
  Administered 2010-12-09: 1 via TRANSDERMAL
  Filled 2010-12-09: qty 1

## 2010-12-09 MED ORDER — DOCUSATE SODIUM 100 MG PO CAPS
100.0000 mg | ORAL_CAPSULE | Freq: Two times a day (BID) | ORAL | Status: DC
  Administered 2010-12-09 – 2010-12-14 (×10): 100 mg via ORAL
  Filled 2010-12-09 (×10): qty 1

## 2010-12-09 MED ORDER — PROPOFOL 10 MG/ML IV EMUL
INTRAVENOUS | Status: DC | PRN
  Administered 2010-12-09: 80 mg via INTRAVENOUS

## 2010-12-09 MED ORDER — GLYCOPYRROLATE 0.2 MG/ML IJ SOLN
INTRAMUSCULAR | Status: DC | PRN
  Administered 2010-12-09: .6 mg via INTRAVENOUS

## 2010-12-09 MED ORDER — SUCCINYLCHOLINE CHLORIDE 20 MG/ML IJ SOLN
INTRAMUSCULAR | Status: DC | PRN
  Administered 2010-12-09: 100 mg via INTRAVENOUS

## 2010-12-09 MED ORDER — ACETAMINOPHEN 650 MG RE SUPP: 650.0000 mg | RECTAL | Status: DC | PRN

## 2010-12-09 MED ORDER — ONE-DAILY MULTI VITAMINS PO TABS
1.0000 | ORAL_TABLET | Freq: Every day | ORAL | Status: DC
  Administered 2010-12-10 – 2010-12-14 (×5): 1 via ORAL
  Filled 2010-12-09 (×6): qty 1

## 2010-12-09 MED ORDER — BUPIVACAINE-EPINEPHRINE 0.5% -1:200000 IJ SOLN
INTRAMUSCULAR | Status: DC | PRN
  Administered 2010-12-09: 9 mL

## 2010-12-09 MED ORDER — KETOROLAC TROMETHAMINE 0.5 % OP SOLN
1.0000 [drp] | Freq: Four times a day (QID) | OPHTHALMIC | Status: AC
  Administered 2010-12-09 – 2010-12-10 (×3): 1 [drp] via OPHTHALMIC
  Filled 2010-12-09: qty 3

## 2010-12-09 MED ORDER — HEMOSTATIC AGENTS (NO CHARGE) OPTIME
TOPICAL | Status: DC | PRN
  Administered 2010-12-09: 1 via TOPICAL

## 2010-12-09 MED ORDER — ZOLPIDEM TARTRATE 10 MG PO TABS: 10.0000 mg | ORAL_TABLET | Freq: Every evening | ORAL | Status: DC | PRN

## 2010-12-09 MED ORDER — LEVOTHYROXINE SODIUM 25 MCG PO TABS
25.0000 ug | ORAL_TABLET | Freq: Every day | ORAL | Status: DC
  Administered 2010-12-09 – 2010-12-14 (×6): 25 ug via ORAL
  Filled 2010-12-09 (×6): qty 1

## 2010-12-09 MED ORDER — DEXAMETHASONE 4 MG PO TABS
4.0000 mg | ORAL_TABLET | Freq: Four times a day (QID) | ORAL | Status: AC
  Administered 2010-12-09 – 2010-12-10 (×2): 4 mg via ORAL
  Filled 2010-12-09 (×4): qty 1

## 2010-12-09 MED ORDER — SODIUM CHLORIDE 0.9 % IV SOLN: 250.0000 mL | INTRAVENOUS | Status: DC

## 2010-12-09 MED ORDER — THROMBIN 20000 UNITS EX KIT
PACK | CUTANEOUS | Status: DC | PRN
  Administered 2010-12-09: 20000 [IU] via TOPICAL

## 2010-12-09 MED ORDER — PHENOL 1.4 % MT LIQD: 1.0000 | OROMUCOSAL | Status: DC | PRN

## 2010-12-09 MED ORDER — DIAZEPAM 5 MG PO TABS
5.0000 mg | ORAL_TABLET | Freq: Four times a day (QID) | ORAL | Status: DC | PRN
  Administered 2010-12-10 – 2010-12-13 (×10): 5 mg via ORAL
  Filled 2010-12-09 (×10): qty 1

## 2010-12-09 MED ORDER — CEFAZOLIN SODIUM 1-5 GM-% IV SOLN
INTRAVENOUS | Status: DC | PRN
  Administered 2010-12-09: 2 g via INTRAVENOUS

## 2010-12-09 MED ORDER — MORPHINE SULFATE 4 MG/ML IJ SOLN
1.0000 mg | INTRAMUSCULAR | Status: DC | PRN
  Administered 2010-12-09: 4 mg via INTRAVENOUS
  Administered 2010-12-09: 2 mg via INTRAVENOUS
  Administered 2010-12-09 – 2010-12-12 (×7): 4 mg via INTRAVENOUS
  Filled 2010-12-09 (×9): qty 1

## 2010-12-09 MED ORDER — LACTATED RINGERS IV SOLN
INTRAVENOUS | Status: DC
  Administered 2010-12-09 – 2010-12-11 (×3): via INTRAVENOUS

## 2010-12-09 MED ORDER — ONDANSETRON HCL 4 MG/2ML IJ SOLN
4.0000 mg | INTRAMUSCULAR | Status: DC | PRN
  Administered 2010-12-09 – 2010-12-12 (×5): 4 mg via INTRAVENOUS
  Filled 2010-12-09 (×4): qty 2

## 2010-12-09 MED ORDER — MIDAZOLAM HCL 5 MG/5ML IJ SOLN
INTRAMUSCULAR | Status: DC | PRN
  Administered 2010-12-09: 2 mg via INTRAVENOUS

## 2010-12-09 MED ORDER — ACETAMINOPHEN 325 MG PO TABS: 650.0000 mg | ORAL_TABLET | ORAL | Status: DC | PRN

## 2010-12-09 MED ORDER — SODIUM CHLORIDE 0.9 % IR SOLN
Status: DC | PRN
  Administered 2010-12-09: 1000 mL

## 2010-12-09 SURGICAL SUPPLY — 64 items
APL SKNCLS STERI-STRIP NONHPOA (GAUZE/BANDAGES/DRESSINGS) ×1
BAG DECANTER FOR FLEXI CONT (MISCELLANEOUS) ×2 IMPLANT
BENZOIN TINCTURE PRP APPL 2/3 (GAUZE/BANDAGES/DRESSINGS) ×3 IMPLANT
BIT DRILL SM SPINE QC 12 (BIT) ×1 IMPLANT
BLADE SURG 15 STRL LF DISP TIS (BLADE) ×1 IMPLANT
BLADE SURG 15 STRL SS (BLADE) ×4
BLADE ULTRA TIP 2M (BLADE) ×2 IMPLANT
BRUSH SCRUB EZ PLAIN DRY (MISCELLANEOUS) ×2 IMPLANT
BUR BARREL STRAIGHT FLUTE 4.0 (BURR) ×3 IMPLANT
BUR MATCHSTICK NEURO 3.0 LAGG (BURR) ×2 IMPLANT
CANISTER SUCTION 2500CC (MISCELLANEOUS) ×2 IMPLANT
CLOSURE STERI STRIP 1/2 X4 (GAUZE/BANDAGES/DRESSINGS) ×1 IMPLANT
CLOTH BEACON ORANGE TIMEOUT ST (SAFETY) ×2 IMPLANT
CONT SPEC 4OZ CLIKSEAL STRL BL (MISCELLANEOUS) ×2 IMPLANT
COVER MAYO STAND STRL (DRAPES) ×2 IMPLANT
DEVICE FUSION VIST S 14X14X6MM (Trauma) IMPLANT
DRAIN JACKSON PRATT 10MM FLAT (MISCELLANEOUS) ×1 IMPLANT
DRAPE LAPAROTOMY 100X72 PEDS (DRAPES) ×2 IMPLANT
DRAPE MICROSCOPE LEICA (MISCELLANEOUS) ×1 IMPLANT
DRAPE POUCH INSTRU U-SHP 10X18 (DRAPES) ×2 IMPLANT
DRAPE SURG 17X23 STRL (DRAPES) ×4 IMPLANT
ELECT REM PT RETURN 9FT ADLT (ELECTROSURGICAL) ×2
ELECTRODE REM PT RTRN 9FT ADLT (ELECTROSURGICAL) ×1 IMPLANT
EVACUATOR SILICONE 100CC (DRAIN) ×2 IMPLANT
GAUZE SPONGE 4X4 16PLY XRAY LF (GAUZE/BANDAGES/DRESSINGS) IMPLANT
GLOVE BIO SURGEON STRL SZ8.5 (GLOVE) ×2 IMPLANT
GLOVE BIOGEL PI IND STRL 7.0 (GLOVE) IMPLANT
GLOVE BIOGEL PI INDICATOR 7.0 (GLOVE) ×2
GLOVE EXAM NITRILE LRG STRL (GLOVE) IMPLANT
GLOVE EXAM NITRILE MD LF STRL (GLOVE) ×2 IMPLANT
GLOVE EXAM NITRILE XL STR (GLOVE) IMPLANT
GLOVE EXAM NITRILE XS STR PU (GLOVE) IMPLANT
GLOVE SS BIOGEL STRL SZ 8 (GLOVE) ×1 IMPLANT
GLOVE SUPERSENSE BIOGEL SZ 8 (GLOVE) ×1
GLOVE SURG SS PI 6.5 STRL IVOR (GLOVE) ×3 IMPLANT
GOWN BRE IMP SLV AUR LG STRL (GOWN DISPOSABLE) ×2 IMPLANT
GOWN BRE IMP SLV AUR XL STRL (GOWN DISPOSABLE) ×1 IMPLANT
KIT BASIN OR (CUSTOM PROCEDURE TRAY) ×2 IMPLANT
KIT ROOM TURNOVER OR (KITS) ×2 IMPLANT
MARKER SKIN DUAL TIP RULER LAB (MISCELLANEOUS) ×2 IMPLANT
NDL SPNL 18GX3.5 QUINCKE PK (NEEDLE) ×1 IMPLANT
NEEDLE HYPO 22GX1.5 SAFETY (NEEDLE) ×2 IMPLANT
NEEDLE SPNL 18GX3.5 QUINCKE PK (NEEDLE) ×2 IMPLANT
NS IRRIG 1000ML POUR BTL (IV SOLUTION) ×2 IMPLANT
PACK LAMINECTOMY NEURO (CUSTOM PROCEDURE TRAY) ×2 IMPLANT
PATTIES SURGICAL .5 X.5 (GAUZE/BANDAGES/DRESSINGS) ×2 IMPLANT
PATTIES SURGICAL 1X1 (DISPOSABLE) ×1 IMPLANT
PIN DISTRACTION 14MM (PIN) ×4 IMPLANT
PLATE ANT CERV XTEND 4 LV 63 (Plate) ×1 IMPLANT
PUTTY 10ML ACTIFUSE ABX (Putty) ×1 IMPLANT
RUBBERBAND STERILE (MISCELLANEOUS) ×2 IMPLANT
SCREW XTD VAR 4.2 SELF TAP 12 (Screw) ×10 IMPLANT
SPONGE GAUZE 4X4 12PLY (GAUZE/BANDAGES/DRESSINGS) ×2 IMPLANT
SPONGE INTESTINAL PEANUT (DISPOSABLE) ×4 IMPLANT
SPONGE SURGIFOAM ABS GEL 100 (HEMOSTASIS) ×2 IMPLANT
STRIP CLOSURE SKIN 1/2X4 (GAUZE/BANDAGES/DRESSINGS) ×2 IMPLANT
SUT VIC AB 3-0 SH 8-18 (SUTURE) ×3 IMPLANT
SYR 20ML ECCENTRIC (SYRINGE) ×2 IMPLANT
TAPE CLOTH SURG 4X10 WHT LF (GAUZE/BANDAGES/DRESSINGS) ×1 IMPLANT
TOWEL OR 17X24 6PK STRL BLUE (TOWEL DISPOSABLE) ×2 IMPLANT
TOWEL OR 17X26 10 PK STRL BLUE (TOWEL DISPOSABLE) ×2 IMPLANT
TRAY FOLEY CATH 14FRSI W/METER (CATHETERS) ×1 IMPLANT
VISTA S O 14X14X6MM (Trauma) ×8 IMPLANT
WATER STERILE IRR 1000ML POUR (IV SOLUTION) ×2 IMPLANT

## 2010-12-09 NOTE — Progress Notes (Signed)
Subjective:  The patient is alert, pleasant, and looks well. She has no complaints.  Objective: Vital signs in last 24 hours: Temp:  [98 F (36.7 C)-98.4 F (36.9 C)] 98.4 F (36.9 C) (11/21 1345) Pulse Rate:  [62-94] 75  (11/21 1414) Resp:  [9-29] 15  (11/21 1414) BP: (120-159)/(75-88) 157/75 mmHg (11/21 1403) SpO2:  [95 %-97 %] 96 % (11/21 1414)  Intake/Output from previous day:   Intake/Output this shift: Total I/O In: 2500 [I.V.:2500] Out: 740 [Urine:540; Blood:200]  Physical exam the patient is alert and oriented. Her motor strength is 5 over 5 in her bilateral deltoids, hand grip and lower extremities. Her dressing is clean and dry. There is no evidence of hematoma or shift.  Lab Results: No results found for this basename: WBC:2,HGB:2,HCT:2,PLT:2 in the last 72 hours BMET No results found for this basename: NA:2,K:2,CL:2,CO2:2,GLUCOSE:2,BUN:2,CREATININE:2,CALCIUM:2 in the last 72 hours  Studies/Results: Dg Cervical Spine 2-3 Views  12/09/2010  *RADIOLOGY REPORT*  Clinical Data: ACDF C3-C7  CERVICAL SPINE - 2-3 VIEW  Comparison: None.  Findings: Image #1 reveals localization of the   C3-4 disc space with a needle.  The second image reveals ACDF with anterior plate and interbody bone graft at C3-4, C4-5, C5-6, and C6-7.  Satisfactory appearing fusion.  C6-7 is not well evaluated due to overlying soft tissues.  IMPRESSION: Satisfactory ACDF C3-C7.  Original Report Authenticated By: Camelia Phenes, M.D.    Assessment/Plan: The patient is doing well.  LOS: 0 days     Darion Milewski D 12/09/2010, 2:28 PM

## 2010-12-09 NOTE — Progress Notes (Signed)
Pt. coing of eyes burning. Dr. Jean Rosenthal made aware.

## 2010-12-09 NOTE — Transfer of Care (Signed)
Immediate Anesthesia Transfer of Care Note  Patient: [name]  Procedure(s) Performed:  ANTERIOR CERVICAL DECOMPRESSION/DISCECTOMY FUSION 3 LEVELS - Cervical three-four,Cervical four-five,Cervical Five-Six,Cervical Six-Seven ANTERIOR CERVICAL DECOMPRESSION WITH FUSION INTERBODY PROTHESIS PLATING AND BONEGRAFT  Patient Location: PACU  Anesthesia Type: General  Level of Consciousness: awake and oriented  Airway & Oxygen Therapy: Patient Spontanous Breathing and Patient connected to nasal cannula oxygen  Post-op Assessment: Report given to PACU RN, Post -op Vital signs reviewed and stable and Patient moving all extremities X 4  Post vital signs: Reviewed and stable  Complications: No apparent anesthesia complications

## 2010-12-09 NOTE — OR Nursing (Signed)
Urinary catheter removed at 1337 by B. Kenyon, Charity fundraiser.

## 2010-12-09 NOTE — Anesthesia Postprocedure Evaluation (Signed)
  Anesthesia Post-op Note  Patient: [name]  Procedure(s) Performed:  ANTERIOR CERVICAL DECOMPRESSION/DISCECTOMY FUSION 3 LEVELS - Cervical three-four,Cervical four-five,Cervical Five-Six,Cervical Six-Seven ANTERIOR CERVICAL DECOMPRESSION WITH FUSION INTERBODY PROTHESIS PLATING AND BONEGRAFT  Patient Location: PACU  Anesthesia Type: General  Level of Consciousness: awake and alert   Airway and Oxygen Therapy: Patient Spontanous Breathing and Patient connected to nasal cannula oxygen  Post-op Pain: mild  Post-op Assessment: Post-op Vital signs reviewed, Patient's Cardiovascular Status Stable, Respiratory Function Stable, Patent Airway, No signs of Nausea or vomiting and Pain level controlled  Post-op Vital Signs: Reviewed and stable  Complications: No apparent anesthesia complications   Patient complaining of itchy eyes. No vision sx, no pain. Ketorolac drops ordered.

## 2010-12-09 NOTE — Op Note (Signed)
Brief history: The patient is a 65 year old white female who has suffered from neck pain with numbness and tingling consistent with a cervical myelopathy. She was worked up with a cervical MRI which demonstrated significant neural compression at C3-4, C4-5, C5-6, C6-7. I discussed the various treatment options with the patient including surgery. The patient has weighed the risks, benefits, and alternatives to surgery and decided proceed with a 4 level anterior cervical discectomy fusion and plating.  Preoperative diagnosis: C3-4, C4-5, C5-6, C6-7 disc degeneration, spondylosis, stenosis, cervical myelopathy/radiculopathy, cervicalgia  Postoperative diagnosis: The same  Procedure: C3-4, C4-5, C5-6, C6-7 Anterior cervical discectomy/decompression; C3-4, C4-5, C5-6, C6-7 interbody arthrodesis with local morcellized autograft bone and Actifuse bone graft extender; insertion of interbody prosthesis at C3-4, C4-5, C5-6, C6-7 (Zimmer peek interbody prosthesis); anterior cervical plating from C3-C7 with globus titanium plate  Surgeon: Dr. Delma Officer  Asst.: Dr. Hilda Lias  Anesthesia: Gen. endotracheal  Estimated blood loss: 150 cc  Drains: One prevertebral Jackson-Pratt drain  Complications: None  Description of procedure: The patient was brought to the operating room by the anesthesia team. General endotracheal anesthesia was induced. A roll was placed under the patient's shoulders to keep the neck in the neutral position. The patient's anterior cervical region was then prepared with Betadine scrub and Betadine solution. Sterile drapes were applied.  The area to be incised was then injected with Marcaine with epinephrine solution. I then used a scalpel to make a transverse incision in the patient's left anterior neck. I used the Metzenbaum scissors to divide the platysmal muscle and then to dissect medial to the sternocleidomastoid muscle, jugular vein, and carotid artery. I carefully dissected  down towards the anterior cervical spine identifying the esophagus and retracting it medially. Then using Kitner swabs to clear soft tissue from the anterior cervical spine. We then inserted a bent spinal needle into the upper exposed intervertebral disc space. We then obtained intraoperative radiographs confirm our location.  I then used electrocautery to detach the medial border of the longus colli muscle bilaterally from the C3-4, C4-5, C5-6, C6-7 intervertebral disc spaces. I then inserted the Caspar self-retaining retractor underneath the longus colli muscle bilaterally to provide exposure.  We then incised the intervertebral disc at C5-6. We then performed a partial intervertebral discectomy with a pituitary forceps and the Karlin curettes. I then inserted distraction screws into the vertebral bodies at C5 and C6. We then distracted the interspace. We then used the high-speed drill to decorticate the vertebral endplates at C5 and C6, to drill away the remainder of the intervertebral disc, to drill away some posterior spondylosis, and to thin out the posterior longitudinal ligament. I then incised ligament with the arachnoid knife. We then removed the ligament with a Kerrison punches undercutting the vertebral endplates and decompressing the thecal sac. We then performed foraminotomies about the bilateral C6 nerve roots. This completed the decompression at this level.  We repeated this procedure in an analogous fashion at C3-4, C4-5, C6-7. Decompressing the thecal sac at C3-4, C4-5, C6-7 as well as the bilateral C4, C5 and C7 nerve roots.  We now turned our to attention to the interbody fusion. We used the trial spacers to determine the appropriate size for the interbody prosthesis. We then pre-filled prosthesis with a combination of local morcellized autograft bone that we obtained during decompression as well as Actifuse bone graft extender. We then inserted the prosthesis into the distracted  interspace at C3-4, C4-5, C5-6 and C6-7. We then removed the distraction screws.  There was a good snug fit of the prosthesis in the interspace.    Having completed the fusion we now turned attention to the anterior spinal instrumentation. We used the high-speed drill to drill away some anterior spondylosis at the disc spaces so that the plate lay down flat. We selected the appropriate length titanium anterior cervical plate. We laid it along the anterior aspect of the vertebral bodies from C3-C7. We then drilled 12 mm holes at C3, C4, C5, C6 and C7. We then secured the plate to the vertebral bodies by placing two 12 mm self-tapping screws at C4, C5, C6 and C7. We then obtained intraoperative radiograph. The demonstrating good position of the instrumentation. We therefore secured the screws the plate the locking each cam. This completed the instrumentation.  We then obtained hemostasis using bipolar electrocautery. We irrigated the wound out with bacitracin solution. We then removed the retractor. We inspected the esophagus for any damage. There was none apparent. We then inserted a 10 mm flat Jackson-Pratt drain into the prevertebral space. We tunneled out through separate stab wound. We secured the drain at the exit site with 3-0 nylon suture. We then reapproximated patient's platysmal muscle with interrupted 3-0 Vicryl suture. We then reapproximated the subcutaneous tissue with interrupted 3-0 Vicryl suture. The skin was reapproximated with Steri-Strips and benzoin. The wound was then covered with bacitracin ointment. A sterile dressing was applied. The drapes were removed. Patient was subsequently extubated by the anesthesia team and transported to the post anesthesia care unit in stable condition. All sponge instrument and needle counts were correct at the end of this case.

## 2010-12-09 NOTE — H&P (Signed)
Subjective: Patient is a 65 year old white female who complains of neck pain, bilateral arm pain, numbness, tingling and weakness. She was worked up with a cervical MRI which demonstrated multilevel spondylosis and stenosis. I discussed the various treatment options with the patient including surgery. She has decided proceed with surgery.   Past Medical History  Diagnosis Date  . Anxiety   . Thyroid disease   . Morbid obesity   . PONV (postoperative nausea and vomiting)   . Asthma     enviromental allergies   . Recurrent upper respiratory infection (URI)   . GERD (gastroesophageal reflux disease)     h/o bleeding ulcer, hosp[name]  . Blood transfusion     /w bleeding ulcer & post knee replacement- 2012  . Neuralgia     spondylosis & stenosis  . Hiatal hernia   . Depression   . Hypothyroidism   . Chronic kidney disease     h/o renal calculi, h/o lithotripsy  . Sleep apnea     study- 2008- [place], CPAP not used in 3 months, states she has a new machine  but hasn't used     Past Surgical History  Procedure Date  . Gastric bypass   . Cholecystectomy   . Abdominal hysterectomy   . Appendectomy   . Eye surgery     bilateral cataracts removed, w/IOL  . Tonsillectomy     as a child  . Joint replacement     07/2010 &  2002- respectively- both knees   . Parathyroidectomy   . Cesarean section   . Peripherally inserted central catheter insertion     PICC line for treatment of MRSA    Allergies  Allergen Reactions  . Codeine Nausea Only  . Lexapro   . Sertraline Hcl Nausea Only  . Statins Nausea And Vomiting    History  Substance Use Topics  . Smoking status: Former Smoker    Types: Cigarettes    Quit date: 01/19/1996  . Smokeless tobacco: Never Used  . Alcohol Use: No    Family History  Problem Relation Age of Onset  . Anesthesia problems Neg Hx   . Hypotension Neg Hx   . Malignant hyperthermia Neg Hx   . Pseudochol deficiency Neg Hx    Prior to  Admission medications   Medication Sig Start Date End Date Taking? Authorizing Provider  ALPRAZolam Prudy Feeler) 1 MG tablet Take 1 mg by mouth 3 (three) times daily.    Yes Historical Provider, MD  esomeprazole (NEXIUM) 40 MG capsule Take 40 mg by mouth 2 (two) times daily.     Yes Historical Provider, MD  citalopram (CELEXA) 10 MG tablet Take 10 mg by mouth daily.      Historical Provider, MD  Cyanocobalamin (VITAMIN B-12 IJ) Inject as directed every 30 (thirty) days.      Historical Provider, MD  doxycycline (VIBRAMYCIN) 100 MG capsule Take 100 mg by mouth 2 (two) times daily.      Historical Provider, MD  HYDROcodone-acetaminophen (NORCO) 10-325 MG per tablet Take 1 tablet by mouth every 6 (six) hours as needed. For pain      Historical Provider, MD  levothyroxine (SYNTHROID, LEVOTHROID) 25 MCG tablet Take 25 mcg by mouth daily.      Historical Provider, MD  Multiple Vitamin (MULTIVITAMIN) tablet Take 1 tablet by mouth daily.      Historical Provider, MD  promethazine (PHENERGAN) 25 MG tablet Take 25 mg by mouth every 4 (four) hours as needed.  For nausea     Historical Provider, MD  traZODone (DESYREL) 50 MG tablet Take 50 mg by mouth at bedtime.      Historical Provider, MD     Review of Systems  Positive ROS: Negative except as above  All other systems have been reviewed and were otherwise negative with the exception of those mentioned in the HPI and as above.  Objective: Vital signs in last 24 hours: Temp:  [98 F (36.7 C)] 98 F (36.7 C) (11/21 0641) Pulse Rate:  [62] 62  (11/21 0641) Resp:  [18] 18  (11/21 0641) BP: (120)/(76) 120/76 mmHg (11/21 0641) SpO2:  [96 %] 96 % (11/21 0641)  General Appearance: Alert, cooperative, no distress, appears stated age Head: Normocephalic, without obvious abnormality, atraumatic Eyes: PERRL, conjunctiva/corneas clear, EOM's intact, fundi benign, both eyes      Ears: Normal TM's and external ear canals, both ears Throat: Lips, mucosa, and  tongue normal; teeth and gums normal Neck: Supple, symmetrical, trachea midline, no adenopathy; thyroid: No enlargement/tenderness/nodules; no carotid bruit or JVD Back: Symmetric, no curvature, ROM normal, no CVA tenderness Lungs: Clear to auscultation bilaterally, respirations unlabored Heart: Regular rate and rhythm, S1 and S2 normal, no murmur, rub or gallop Abdomen: Soft, non-tender, bowel sounds active all four quadrants, no masses, no organomegaly Extremities: Extremities normal, atraumatic, no cyanosis or edema Pulses: 2+ and symmetric all extremities Skin: Skin color, texture, turgor normal, no rashes or lesions  NEUROLOGIC:   Mental status: alert and oriented, no aphasia, good attention span, Fund of knowledge/ memory ok Motor Exam - grossly normal except she has weakness in her left deltoid biceps handgrip and triceps at 4+ over 5. Sensory Exam - grossly normal Reflexes: 2+/4 Coordination - grossly normal Gait - grossly normal Balance - grossly normal Cranial Nerves: I: smell Not tested  II: visual acuity  OS: Normal    OD: Normal   II: visual fields Full to confrontation  II: pupils Equal, round, reactive to light  III,VII: ptosis None  III,IV,VI: extraocular muscles  Full ROM  V: mastication Normal  V: facial light touch sensation  Normal  V,VII: corneal reflex  Present  VII: facial muscle function - upper  Normal  VII: facial muscle function - lower Normal  VIII: hearing Not tested  IX: soft palate elevation  Normal  IX,X: gag reflex Present  XI: trapezius strength  5/5  XI: sternocleidomastoid strength 5/5  XI: neck flexion strength  5/5  XII: tongue strength  Normal    Data Review Lab Results  Component Value Date   WBC 7.0 12/02/2010   HGB 12.0 12/02/2010   HCT 38.0 12/02/2010   MCV 90.9 12/02/2010   PLT 181 12/02/2010   Lab Results  Component Value Date   NA 140 12/02/2010   K 4.2 12/02/2010   CL 103 12/02/2010   CO2 31 12/02/2010   BUN 21  12/02/2010   CREATININE 0.85 12/02/2010   GLUCOSE 84 12/02/2010   Lab Results  Component Value Date   INR 1.08 08/05/2010   Imaging studies: I reviewed the patient's cervical MRI performed at [facility] on the 05/18/2010. It demonstrates the patient has significant spondylosis and stenosis at C3-4, C4-5, C5-6, C6-7. Assessment/Plan: C3-4, C4-5, C5-6, C6-7 spondylosis, stenosis, herniated disposes, cervical radiculopathy/myelopathy and cervicalgia: I discussed situation with the patient. I reviewed her MR scan with her. I recommended she undergo a C3-4, C4-5, C5-6, C6-7 anterior cervical discectomy fusion and plating. I described the surgery to  her. We have discussed the risks benefits alternatives and likelihood of achieving our goals. I have answered all her questions. She wants to proceed with surgery.   Dashun Borre D 12/09/2010 8:33 AM

## 2010-12-09 NOTE — Anesthesia Preprocedure Evaluation (Addendum)
Anesthesia Evaluation  Patient identified by MRN, date of birth, ID band Patient awake    Reviewed: Allergy & Precautions, H&P , NPO status , Patient's Chart, lab work & pertinent test results  History of Anesthesia Complications (+) PONV  Airway Mallampati: II TM Distance: >3 FB Neck ROM: Limited    Dental No notable dental hx. (+) Teeth Intact   Pulmonary asthma , sleep apnea and Continuous Positive Airway Pressure Ventilation , Recent URI ,  clear to auscultation  Pulmonary exam normal       Cardiovascular hypertension (borderline HTN, off meds since gastric bypass/weight loss 100 lbs), Regular Normal 1/12 stress ECHO-  No ischemia, normal LVF throughout   Neuro/Psych  Neuromuscular disease    GI/Hepatic Neg liver ROS, hiatal hernia, PUD, GERD-  Medicated and Poorly Controlled,  Endo/Other  Hypothyroidism (medicated) Morbid obesity  Renal/GU negative Renal ROS     Musculoskeletal   Abdominal (+) obese,   Peds  Hematology   Anesthesia Other Findings   Reproductive/Obstetrics                       Anesthesia Physical Anesthesia Plan  ASA: III  Anesthesia Plan: General   Post-op Pain Management:    Induction: Intravenous  Airway Management Planned: Oral ETT and Video Laryngoscope Planned  Additional Equipment:   Intra-op Plan:   Post-operative Plan: Extubation in OR  Informed Consent: I have reviewed the patients History and Physical, chart, labs and discussed the procedure including the risks, benefits and alternatives for the proposed anesthesia with the patient or authorized representative who has indicated his/her understanding and acceptance.   Dental advisory given  Plan Discussed with: CRNA and Surgeon  Anesthesia Plan Comments: (Plan routine monitors, GETA with Video Glide intubation (neck movement exacerbates arm sx))        Anesthesia Quick Evaluation

## 2010-12-09 NOTE — Anesthesia Procedure Notes (Signed)
Procedure Name: Intubation Date/Time: 12/09/2010 9:00 AM Performed by: Gwenyth Allegra Pre-anesthesia Checklist: Patient identified, Emergency Drugs available, Suction available, Patient being monitored and Timeout performed Patient Re-evaluated:Patient Re-evaluated prior to inductionOxygen Delivery Method: Circle System Utilized Preoxygenation: Pre-oxygenation with 100% oxygen Intubation Type: IV induction Ventilation: Mask ventilation without difficulty Laryngoscope Size: Mac and 3 Grade View: Grade II Tube size: 7.0 mm Number of attempts: 1 Airway Equipment and Method: video-laryngoscopy Placement Confirmation: ETT inserted through vocal cords under direct vision,  positive ETCO2,  CO2 detector and breath sounds checked- equal and bilateral Secured at: 21 cm Tube secured with: Tape Dental Injury: Teeth and Oropharynx as per pre-operative assessment

## 2010-12-10 MED FILL — Sodium Chloride Irrigation Soln 0.9%: Qty: 500 | Status: AC

## 2010-12-10 MED FILL — Bacitracin Intramuscular For Soln 50000 Unit: INTRAMUSCULAR | Qty: 1 | Status: AC

## 2010-12-10 NOTE — Progress Notes (Signed)
Subjective: Patient reports she is making slow improvement but slightly worse pain in her neck and shoulders today. No new pain in her arms no new numbness or tingling arms or hands. Her incision is clean and dry.  Objective: Vital signs in last 24 hours: Temp:  [98 F (36.7 C)-98.8 F (37.1 C)] 98.2 F (36.8 C) (11/22 0948) Pulse Rate:  [62-94] 93  (11/22 0948) Resp:  [9-29] 18  (11/22 0948) BP: (99-168)/(63-88) 168/66 mmHg (11/22 0948) SpO2:  [92 %-97 %] 94 % (11/22 0948) Weight:  [99.02 kg (218 lb 4.8 oz)] 218 lb 4.8 oz (99.02 kg) (11/21 1501)  Intake/Output from previous day: 11/21 0701 - 11/22 0700 In: 2582 [I.V.:2575] Out: 2493 [Urine:2215; Drains:78; Blood:200] Intake/Output this shift: Total I/O In: -  Out: 600 [Urine:600]  Patient neurologically stable she continues to have some baseline weakness of her triceps and intrinsics but she says this is the same as it was preoperatively.  Lab Results: No results found for this basename: WBC:2,HGB:2,HCT:2,PLT:2 in the last 72 hours BMET No results found for this basename: NA:2,K:2,CL:2,CO2:2,GLUCOSE:2,BUN:2,CREATININE:2,CALCIUM:2 in the last 72 hours  Studies/Results: Dg Cervical Spine 2-3 Views  12/09/2010  *RADIOLOGY REPORT*  Clinical Data: ACDF C3-C7  CERVICAL SPINE - 2-3 VIEW  Comparison: None.  Findings: Image #1 reveals localization of the   C3-4 disc space with a needle.  The second image reveals ACDF with anterior plate and interbody bone graft at C3-4, C4-5, C5-6, and C6-7.  Satisfactory appearing fusion.  C6-7 is not well evaluated due to overlying soft tissues.  IMPRESSION: Satisfactory ACDF C3-C7.  Original Report Authenticated By: Camelia Phenes, M.D.    Assessment/Plan: We'll progress immobilize the patient today and ambulate. She continues to feel unsteady when she is up she is not ready for discharge.  LOS: 1 day     Zoe Goonan P 12/10/2010, 11:10 AM

## 2010-12-11 MED ORDER — DOXYCYCLINE HYCLATE 100 MG PO TABS
100.0000 mg | ORAL_TABLET | Freq: Two times a day (BID) | ORAL | Status: DC
  Administered 2010-12-11 – 2010-12-14 (×7): 100 mg via ORAL
  Filled 2010-12-11 (×8): qty 1

## 2010-12-11 MED ORDER — DEXAMETHASONE SODIUM PHOSPHATE 4 MG/ML IJ SOLN
4.0000 mg | Freq: Four times a day (QID) | INTRAMUSCULAR | Status: AC
  Administered 2010-12-11 – 2010-12-12 (×4): 4 mg via INTRAVENOUS
  Filled 2010-12-11 (×6): qty 1

## 2010-12-11 NOTE — Progress Notes (Signed)
Subjective: Patient reports Overall today she's a little more sore in her neck or shoulders. The numbness and tingling she was experiencing in her hands preoperatively is overall about the same maybe slightly worse denies any new weakness in her arms or legs. She does report ambulating yesterday went to the bathroom. Swelling is getting better slowly.  Objective: Vital signs in last 24 hours: Temp:  [97.5 F (36.4 C)-98.5 F (36.9 C)] 98.5 F (36.9 C) (11/23 0600) Pulse Rate:  [68-93] 69  (11/23 0600) Resp:  [18] 18  (11/23 0600) BP: (128-168)/(66-93) 167/93 mmHg (11/23 0600) SpO2:  [91 %-98 %] 98 % (11/23 0600)  Intake/Output from previous day: 11/22 0701 - 11/23 0700 In: -  Out: 660 [Urine:600; Drains:60] Intake/Output this shift:    Her incision is dry and soft no swelling the her strength is diffusely for the placenta 5 which is her baseline.  Lab Results: No results found for this basename: WBC:2,HGB:2,HCT:2,PLT:2 in the last 72 hours BMET No results found for this basename: NA:2,K:2,CL:2,CO2:2,GLUCOSE:2,BUN:2,CREATININE:2,CALCIUM:2 in the last 72 hours  Studies/Results: Dg Cervical Spine 2-3 Views  12/09/2010  *RADIOLOGY REPORT*  Clinical Data: ACDF C3-C7  CERVICAL SPINE - 2-3 VIEW  Comparison: None.  Findings: Image #1 reveals localization of the   C3-4 disc space with a needle.  The second image reveals ACDF with anterior plate and interbody bone graft at C3-4, C4-5, C5-6, and C6-7.  Satisfactory appearing fusion.  C6-7 is not well evaluated due to overlying soft tissues.  IMPRESSION: Satisfactory ACDF C3-C7.  Original Report Authenticated By: Camelia Phenes, M.D.    Assessment/Plan: Progressively mobilized today and ambulate in the halls. I have discontinued her J-P drain Endo start her on a couple doses of Decadron for her muscle spasm in her neck and her shoulders.  LOS: 2 days     Lurene Robley P 12/11/2010, 9:39 AM

## 2010-12-12 MED ORDER — BISACODYL 10 MG RE SUPP
10.0000 mg | Freq: Every day | RECTAL | Status: DC | PRN
  Administered 2010-12-13: 10 mg via RECTAL
  Filled 2010-12-12: qty 1

## 2010-12-12 NOTE — Progress Notes (Signed)
Subjective:  The patient is alert and pleasant. Her dysphagia has improved. She wants to stay until tomorrow.  Objective: Vital signs in last 24 hours: Temp:  [97.3 F (36.3 C)-98.5 F (36.9 C)] 97.3 F (36.3 C) (11/24 1000) Pulse Rate:  [60-75] 62  (11/24 1000) Resp:  [18-20] 18  (11/24 1000) BP: (121-160)/(74-88) 160/88 mmHg (11/24 1000) SpO2:  [93 %-97 %] 97 % (11/24 1000)  Intake/Output from previous day: 11/23 0701 - 11/24 0700 In: 4 [IV Piggyback:4] Out: -  Intake/Output this shift: Total I/O In: 200 [P.O.:200] Out: -   Physical exam the patient is alert and oriented. Her motor strength is normal in all 4 extremities. Her incision is healing well without signs of infection, hematoma, and shift.  Lab Results: No results found for this basename: WBC:2,HGB:2,HCT:2,PLT:2 in the last 72 hours BMET No results found for this basename: NA:2,K:2,CL:2,CO2:2,GLUCOSE:2,BUN:2,CREATININE:2,CALCIUM:2 in the last 72 hours  Studies/Results: No results found.  Assessment/Plan: Postop day #3: The patient seizing making progress. I will tentatively plan to send her home tomorrow.  LOS: 3 days     Braya Habermehl D 12/12/2010, 10:26 AM

## 2010-12-13 NOTE — Progress Notes (Signed)
Patient ID: [name], female   DOB: [birth year], 65 y.o.   MRN: [id number] Subjective:  The patient is alert and pleasant. Her neck is still sore. She does not feel she can go home until tomorrow.  Objective: Vital signs in last 24 hours: Temp:  [97.3 F (36.3 C)-98.6 F (37 C)] 98.2 F (36.8 C) (11/25 0600) Pulse Rate:  [56-86] 86  (11/25 0600) Resp:  [18-20] 19  (11/25 0600) BP: (109-159)/(54-91) 136/54 mmHg (11/25 0600) SpO2:  [94 %-98 %] 96 % (11/25 0600)  Intake/Output from previous day: 11/24 0701 - 11/25 0700 In: 900 [P.O.:900] Out: -  Intake/Output this shift:    Physical exam the patient is alert and oriented. Her motor strength is grossly normal in all 4 extremities. Her incision is healing well without signs of infection, hematoma, midline shift.  Lab Results: No results found for this basename: WBC:2,HGB:2,HCT:2,PLT:2 in the last 72 hours BMET No results found for this basename: NA:2,K:2,CL:2,CO2:2,GLUCOSE:2,BUN:2,CREATININE:2,CALCIUM:2 in the last 72 hours  Studies/Results: No results found.  Assessment/Plan: Postop day #4: We will tentatively made to discharge the patient tomorrow.  LOS: 4 days     Tajha Sammarco D 12/13/2010, 10:03 AM

## 2010-12-14 ENCOUNTER — Encounter (HOSPITAL_COMMUNITY): Payer: Self-pay | Admitting: Neurosurgery

## 2010-12-14 MED ORDER — DIAZEPAM 5 MG PO TABS: 5.0000 mg | ORAL_TABLET | Freq: Four times a day (QID) | ORAL | Status: AC | PRN

## 2010-12-14 MED ORDER — OXYCODONE-ACETAMINOPHEN 5-325 MG PO TABS: 1.0000 | ORAL_TABLET | ORAL | Status: AC | PRN

## 2010-12-14 MED ORDER — DSS 100 MG PO CAPS: 100.0000 mg | ORAL_CAPSULE | Freq: Two times a day (BID) | ORAL | Status: AC

## 2010-12-14 NOTE — Progress Notes (Signed)
Patient ID: [name], female   DOB: [birth year], 65 y.o.   MRN: [id number] Subjective:  The patient is alert and pleasant. She wants to go home.  Objective: Vital signs in last 24 hours: Temp:  [97.1 F (36.2 C)-98.4 F (36.9 C)] 97.3 F (36.3 C) (11/26 0949) Pulse Rate:  [68-83] 83  (11/26 0949) Resp:  [17-19] 17  (11/26 0949) BP: (102-138)/(68-78) 122/78 mmHg (11/26 0949) SpO2:  [94 %-97 %] 96 % (11/26 0949)  Intake/Output from previous day:   Intake/Output this shift:    Physical exam the patient is alert and oriented. Her wound is healing well. There is no signs of infection, hematoma, shift. Her motor strength is 5 over 5 in all 4 extremities.  Lab Results: No results found for this basename: WBC:2,HGB:2,HCT:2,PLT:2 in the last 72 hours BMET No results found for this basename: NA:2,K:2,CL:2,CO2:2,GLUCOSE:2,BUN:2,CREATININE:2,CALCIUM:2 in the last 72 hours  Studies/Results: No results found.  Assessment/Plan: Postop day #5: The patient is doing well. I have given her oral and written discharge instructions. Advanced all her questions. She will be discharged to home.  LOS: 5 days     Shawnie Nicole D 12/14/2010, 11:28 AM

## 2010-12-14 NOTE — Plan of Care (Signed)
Problem: Consults Goal: Diagnosis - Spinal Surgery Outcome: Progressing Microdiscectomy     

## 2010-12-14 NOTE — Discharge Summary (Signed)
Physician Discharge Summary  Patient ID: [name] MRN: [id number] DOB/AGE: 64-14-1947 64 y.o.  Admit date: 12/09/2010 Discharge date: 12/14/2010  Admission Diagnoses:  Discharge Diagnoses:  Active Problems:  * No active hospital problems. *    Discharged Condition: good  Hospital Course: I admitted the patient to [facility] 5 days ago. On that day I performed a C3-4, C4-5, C5-6, C6-7 intracervical discectomy, decompression, fusion, and plating. The surgery went well. The patient's postoperative course was unremarkable except for some expected dysphagia. This resolved and the patient was discharged home on 12/14/2010.  Consults: None Significant Diagnostic Studies: None Treatments: C3-4, C4-5, C5-6, C6-7 intracervical discectomy/decompression; C3-4, C4-5, C5-6, C6-7 anterior interbody arthrodesis with local morselized autograft bone and Actifusebone graft extender; insertion of interbody prosthesis at C3-4, C4-5, C5-6, C6-7 (Zimmer peek interbody prosthesis); anterior cervical plating C3-C7 with globus titanium plate and screws. Discharge Exam: Blood pressure 122/78, pulse 83, temperature 97.3 F (36.3 C), temperature source Oral, resp. rate 17, height 5\' 5"  (1.651 m), weight 99.02 kg (218 lb 4.8 oz), SpO2 96.00%. The patient is alert and oriented. Her motor strength is normal in all 4 extremities. Her incision is healing well without signs of infection, hematoma, and shift.  Disposition: Home  Discharge Orders    Future Orders Please Complete By Expires   Diet - low sodium heart healthy      Increase activity slowly      Discharge instructions      Comments:   Patient was given oral and written discharge instructions. All her questions were answered. She was instructed to call the office for followup appointment.   No dressing needed      Call MD for:      Call MD for:  temperature >100.4      Call MD for:  persistant nausea and vomiting      Call MD for:  severe  uncontrolled pain      Call MD for:  redness, tenderness, or signs of infection (pain, swelling, redness, odor or green/yellow discharge around incision site)      Call MD for:  difficulty breathing, headache or visual disturbances      Call MD for:  hives      Call MD for:  persistant dizziness or light-headedness      Call MD for:  extreme fatigue        Current Discharge Medication List    START taking these medications   Details  diazepam (VALIUM) 5 MG tablet Take 1 tablet (5 mg total) by mouth every 6 (six) hours as needed. Qty: 50 tablet, Refills: 1    docusate sodium 100 MG CAPS Take 100 mg by mouth 2 (two) times daily. Qty: 60 capsule, Refills: 1    oxyCODONE-acetaminophen (PERCOCET) 5-325 MG per tablet Take 1-2 tablets by mouth every 4 (four) hours as needed. Qty: 100 tablet, Refills: 0      CONTINUE these medications which have NOT CHANGED   Details  ALPRAZolam (XANAX) 1 MG tablet Take 1 mg by mouth 3 (three) times daily.     esomeprazole (NEXIUM) 40 MG capsule Take 40 mg by mouth 2 (two) times daily.      citalopram (CELEXA) 10 MG tablet Take 10 mg by mouth daily.      Cyanocobalamin (VITAMIN B-12 IJ) Inject as directed every 30 (thirty) days.      doxycycline (VIBRAMYCIN) 100 MG capsule Take 100 mg by mouth 2 (two) times daily.  levothyroxine (SYNTHROID, LEVOTHROID) 25 MCG tablet Take 25 mcg by mouth daily.      Multiple Vitamin (MULTIVITAMIN) tablet Take 1 tablet by mouth daily.      traZODone (DESYREL) 50 MG tablet Take 50 mg by mouth at bedtime.        STOP taking these medications     HYDROcodone-acetaminophen (NORCO) 10-325 MG per tablet      promethazine (PHENERGAN) 25 MG tablet          Signed: Jebadiah Imperato D 12/14/2010, 11:30 AM

## 2011-05-24 DIAGNOSIS — Z981 Arthrodesis status: Secondary | ICD-10-CM | POA: Diagnosis not present

## 2011-05-24 DIAGNOSIS — R51 Headache: Secondary | ICD-10-CM | POA: Diagnosis not present

## 2011-08-04 ENCOUNTER — Other Ambulatory Visit (HOSPITAL_COMMUNITY): Payer: Self-pay | Admitting: Family Medicine

## 2011-08-04 DIAGNOSIS — K2100 Gastro-esophageal reflux disease with esophagitis, without bleeding: Secondary | ICD-10-CM | POA: Diagnosis not present

## 2011-08-04 DIAGNOSIS — Z139 Encounter for screening, unspecified: Secondary | ICD-10-CM

## 2011-08-04 DIAGNOSIS — E782 Mixed hyperlipidemia: Secondary | ICD-10-CM | POA: Diagnosis not present

## 2011-08-09 ENCOUNTER — Ambulatory Visit (HOSPITAL_COMMUNITY)
Admission: RE | Admit: 2011-08-09 | Discharge: 2011-08-09 | Disposition: A | Discharge status: Home / Self Care | Payer: Medicare Other | Source: Ambulatory Visit | Attending: Family Medicine | Admitting: Family Medicine

## 2011-08-09 DIAGNOSIS — Z139 Encounter for screening, unspecified: Secondary | ICD-10-CM

## 2011-08-09 DIAGNOSIS — Z1231 Encounter for screening mammogram for malignant neoplasm of breast: Secondary | ICD-10-CM | POA: Insufficient documentation

## 2011-08-16 DIAGNOSIS — M79609 Pain in unspecified limb: Secondary | ICD-10-CM | POA: Diagnosis not present

## 2011-08-16 DIAGNOSIS — M48061 Spinal stenosis, lumbar region without neurogenic claudication: Secondary | ICD-10-CM | POA: Diagnosis not present

## 2011-08-16 DIAGNOSIS — M545 Low back pain, unspecified: Secondary | ICD-10-CM | POA: Diagnosis not present

## 2011-10-19 ENCOUNTER — Other Ambulatory Visit (HOSPITAL_COMMUNITY): Payer: Self-pay | Admitting: Psychiatry

## 2011-10-20 ENCOUNTER — Encounter (HOSPITAL_COMMUNITY): Payer: Self-pay | Admitting: Psychiatry

## 2011-10-20 ENCOUNTER — Ambulatory Visit (INDEPENDENT_AMBULATORY_CARE_PROVIDER_SITE_OTHER): Payer: Medicare Other | Admitting: Psychiatry

## 2011-10-20 VITALS — BP 139/67 | HR 77 | Ht 64.0 in | Wt 235.0 lb

## 2011-10-20 DIAGNOSIS — F09 Unspecified mental disorder due to known physiological condition: Secondary | ICD-10-CM

## 2011-10-20 DIAGNOSIS — F329 Major depressive disorder, single episode, unspecified: Secondary | ICD-10-CM

## 2011-10-20 MED ORDER — AMITRIPTYLINE HCL 25 MG PO TABS: 25.0000 mg | ORAL_TABLET | Freq: Every day | ORAL | Status: DC
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
## 2011-10-20 NOTE — Patient Instructions (Signed)
You are schedule to see therapist Florencia Reasons on 11/02/11 in morning and Dr Lolly Mustache on 11/04/11.

## 2011-11-02 ENCOUNTER — Ambulatory Visit (INDEPENDENT_AMBULATORY_CARE_PROVIDER_SITE_OTHER): Payer: Self-pay | Admitting: Psychiatry

## 2011-11-02 DIAGNOSIS — F329 Major depressive disorder, single episode, unspecified: Secondary | ICD-10-CM

## 2011-11-02 DIAGNOSIS — F39 Unspecified mood [affective] disorder: Secondary | ICD-10-CM

## 2011-11-02 NOTE — Patient Instructions (Signed)
Discussed orally 
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
## 2011-11-04 ENCOUNTER — Ambulatory Visit (INDEPENDENT_AMBULATORY_CARE_PROVIDER_SITE_OTHER): Payer: Self-pay | Admitting: Psychiatry

## 2011-11-04 ENCOUNTER — Encounter (HOSPITAL_COMMUNITY): Payer: Self-pay | Admitting: Psychiatry

## 2011-11-04 VITALS — Wt 229.0 lb

## 2011-11-04 DIAGNOSIS — F329 Major depressive disorder, single episode, unspecified: Secondary | ICD-10-CM

## 2011-11-04 DIAGNOSIS — F09 Unspecified mental disorder due to known physiological condition: Secondary | ICD-10-CM

## 2011-11-04 MED ORDER — AMITRIPTYLINE HCL 25 MG PO TABS: 25.0000 mg | ORAL_TABLET | Freq: Every day | ORAL | Status: DC
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
## 2011-11-09 ENCOUNTER — Ambulatory Visit (HOSPITAL_COMMUNITY): Payer: Self-pay | Admitting: Psychiatry

## 2011-11-20 ENCOUNTER — Other Ambulatory Visit (HOSPITAL_COMMUNITY): Payer: Self-pay | Admitting: Psychiatry

## 2011-11-21 ENCOUNTER — Other Ambulatory Visit (HOSPITAL_COMMUNITY): Payer: Self-pay | Admitting: Psychiatry

## 2011-11-24 ENCOUNTER — Ambulatory Visit (INDEPENDENT_AMBULATORY_CARE_PROVIDER_SITE_OTHER): Payer: Medicare Other | Admitting: Psychiatry

## 2011-11-24 DIAGNOSIS — F329 Major depressive disorder, single episode, unspecified: Secondary | ICD-10-CM

## 2011-11-25 NOTE — Patient Instructions (Signed)
Discussed orally 
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
## 2011-12-03 ENCOUNTER — Encounter (HOSPITAL_COMMUNITY): Payer: Self-pay | Admitting: Psychiatry

## 2011-12-03 ENCOUNTER — Ambulatory Visit (INDEPENDENT_AMBULATORY_CARE_PROVIDER_SITE_OTHER): Payer: Medicare Other | Admitting: Psychiatry

## 2011-12-03 VITALS — BP 160/84 | HR 60 | Ht 64.0 in | Wt 241.0 lb

## 2011-12-03 DIAGNOSIS — F5105 Insomnia due to other mental disorder: Secondary | ICD-10-CM | POA: Insufficient documentation

## 2011-12-03 DIAGNOSIS — F411 Generalized anxiety disorder: Secondary | ICD-10-CM

## 2011-12-03 DIAGNOSIS — F329 Major depressive disorder, single episode, unspecified: Secondary | ICD-10-CM

## 2011-12-03 DIAGNOSIS — F341 Dysthymic disorder: Secondary | ICD-10-CM

## 2011-12-03 DIAGNOSIS — T424X5A Adverse effect of benzodiazepines, initial encounter: Secondary | ICD-10-CM | POA: Insufficient documentation

## 2011-12-03 MED ORDER — AMITRIPTYLINE HCL 25 MG PO TABS: 25.0000 mg | ORAL_TABLET | Freq: Every day | ORAL | Status: DC

## 2011-12-03 MED ORDER — GABAPENTIN 100 MG PO CAPS: 100.0000 mg | ORAL_CAPSULE | Freq: Four times a day (QID) | ORAL | Status: DC
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
## 2011-12-03 NOTE — Patient Instructions (Signed)
Use the Neurontin to help get off the Xanax.  You may use up to 2 or even 3 of the Neurontin for the anxiety and may use it up to four times a day.

## 2011-12-06 DIAGNOSIS — M545 Low back pain, unspecified: Secondary | ICD-10-CM | POA: Diagnosis not present

## 2011-12-08 ENCOUNTER — Ambulatory Visit (HOSPITAL_COMMUNITY): Payer: Self-pay | Admitting: Psychiatry

## 2011-12-13 ENCOUNTER — Other Ambulatory Visit (HOSPITAL_COMMUNITY): Payer: Self-pay | Admitting: *Deleted

## 2011-12-13 DIAGNOSIS — F5105 Insomnia due to other mental disorder: Secondary | ICD-10-CM

## 2011-12-13 DIAGNOSIS — F341 Dysthymic disorder: Secondary | ICD-10-CM

## 2011-12-13 DIAGNOSIS — F411 Generalized anxiety disorder: Secondary | ICD-10-CM

## 2011-12-13 DIAGNOSIS — F329 Major depressive disorder, single episode, unspecified: Secondary | ICD-10-CM

## 2011-12-13 MED ORDER — AMITRIPTYLINE HCL 25 MG PO TABS: 25.0000 mg | ORAL_TABLET | Freq: Every day | ORAL | Status: DC

## 2011-12-24 ENCOUNTER — Telehealth (HOSPITAL_COMMUNITY): Payer: Self-pay | Admitting: Psychiatry

## 2011-12-24 ENCOUNTER — Ambulatory Visit (INDEPENDENT_AMBULATORY_CARE_PROVIDER_SITE_OTHER): Payer: Medicare Other | Admitting: Psychiatry

## 2011-12-24 DIAGNOSIS — F329 Major depressive disorder, single episode, unspecified: Secondary | ICD-10-CM
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
## 2011-12-24 NOTE — Patient Instructions (Signed)
Discussed orally 

## 2012-01-03 ENCOUNTER — Ambulatory Visit (HOSPITAL_COMMUNITY): Payer: Medicare Other | Admitting: Psychiatry

## 2012-01-05 ENCOUNTER — Ambulatory Visit (INDEPENDENT_AMBULATORY_CARE_PROVIDER_SITE_OTHER): Payer: Medicare Other | Admitting: Psychiatry

## 2012-01-05 DIAGNOSIS — F329 Major depressive disorder, single episode, unspecified: Secondary | ICD-10-CM

## 2012-01-07 NOTE — Patient Instructions (Signed)
Discussed orally 
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
## 2012-01-17 ENCOUNTER — Ambulatory Visit (HOSPITAL_COMMUNITY): Payer: Self-pay | Admitting: Psychiatry

## 2012-01-27 ENCOUNTER — Ambulatory Visit (HOSPITAL_COMMUNITY): Payer: Self-pay | Admitting: Psychiatry

## 2012-01-28 ENCOUNTER — Ambulatory Visit (INDEPENDENT_AMBULATORY_CARE_PROVIDER_SITE_OTHER): Payer: Self-pay | Admitting: General Surgery

## 2012-01-31 ENCOUNTER — Ambulatory Visit (HOSPITAL_COMMUNITY): Payer: Self-pay | Admitting: Psychiatry

## 2012-02-21 ENCOUNTER — Ambulatory Visit (INDEPENDENT_AMBULATORY_CARE_PROVIDER_SITE_OTHER): Payer: Medicare Other | Admitting: General Surgery

## 2012-02-24 ENCOUNTER — Encounter (INDEPENDENT_AMBULATORY_CARE_PROVIDER_SITE_OTHER): Payer: Self-pay | Admitting: General Surgery

## 2012-04-20 ENCOUNTER — Emergency Department (HOSPITAL_COMMUNITY): Payer: Medicare Other

## 2012-04-20 ENCOUNTER — Encounter (HOSPITAL_COMMUNITY): Payer: Self-pay | Admitting: *Deleted

## 2012-04-20 ENCOUNTER — Emergency Department (HOSPITAL_COMMUNITY)
Admission: EM | Admit: 2012-04-20 | Discharge: 2012-04-20 | Disposition: A | Discharge status: Home / Self Care | Payer: Medicare Other | Attending: Emergency Medicine | Admitting: Emergency Medicine

## 2012-04-20 DIAGNOSIS — Z87891 Personal history of nicotine dependence: Secondary | ICD-10-CM | POA: Diagnosis not present

## 2012-04-20 DIAGNOSIS — Z8709 Personal history of other diseases of the respiratory system: Secondary | ICD-10-CM | POA: Insufficient documentation

## 2012-04-20 DIAGNOSIS — I129 Hypertensive chronic kidney disease with stage 1 through stage 4 chronic kidney disease, or unspecified chronic kidney disease: Secondary | ICD-10-CM | POA: Insufficient documentation

## 2012-04-20 DIAGNOSIS — Z79899 Other long term (current) drug therapy: Secondary | ICD-10-CM | POA: Insufficient documentation

## 2012-04-20 DIAGNOSIS — K219 Gastro-esophageal reflux disease without esophagitis: Secondary | ICD-10-CM | POA: Insufficient documentation

## 2012-04-20 DIAGNOSIS — N189 Chronic kidney disease, unspecified: Secondary | ICD-10-CM | POA: Diagnosis not present

## 2012-04-20 DIAGNOSIS — E039 Hypothyroidism, unspecified: Secondary | ICD-10-CM | POA: Insufficient documentation

## 2012-04-20 DIAGNOSIS — F411 Generalized anxiety disorder: Secondary | ICD-10-CM | POA: Diagnosis not present

## 2012-04-20 DIAGNOSIS — F329 Major depressive disorder, single episode, unspecified: Secondary | ICD-10-CM | POA: Diagnosis not present

## 2012-04-20 DIAGNOSIS — J45901 Unspecified asthma with (acute) exacerbation: Secondary | ICD-10-CM | POA: Diagnosis not present

## 2012-04-20 DIAGNOSIS — Z8719 Personal history of other diseases of the digestive system: Secondary | ICD-10-CM | POA: Insufficient documentation

## 2012-04-20 DIAGNOSIS — M7989 Other specified soft tissue disorders: Secondary | ICD-10-CM | POA: Diagnosis not present

## 2012-04-20 DIAGNOSIS — R079 Chest pain, unspecified: Secondary | ICD-10-CM | POA: Insufficient documentation

## 2012-04-20 DIAGNOSIS — F3289 Other specified depressive episodes: Secondary | ICD-10-CM | POA: Insufficient documentation

## 2012-04-20 DIAGNOSIS — R002 Palpitations: Secondary | ICD-10-CM

## 2012-04-20 DIAGNOSIS — Z8669 Personal history of other diseases of the nervous system and sense organs: Secondary | ICD-10-CM | POA: Insufficient documentation

## 2012-04-20 LAB — CBC WITH DIFFERENTIAL/PLATELET
Basophils Relative: 1 % (ref 0–1)
Eosinophils Absolute: 0.1 10*3/uL (ref 0.0–0.7)
Eosinophils Relative: 2 % (ref 0–5)
Lymphs Abs: 2.5 10*3/uL (ref 0.7–4.0)
MCH: 26.3 pg (ref 26.0–34.0)
MCHC: 31.6 g/dL (ref 30.0–36.0)
MCV: 83 fL (ref 78.0–100.0)
Platelets: 225 10*3/uL (ref 150–400)
RBC: 4.53 MIL/uL (ref 3.87–5.11)
RDW: 15.3 % (ref 11.5–15.5)

## 2012-04-20 LAB — BASIC METABOLIC PANEL
Calcium: 9.6 mg/dL (ref 8.4–10.5)
GFR calc non Af Amer: 64 mL/min — ABNORMAL LOW (ref >90)
Glucose, Bld: 90 mg/dL (ref 70–99)
Sodium: 141 mEq/L (ref 135–145)

## 2012-04-20 MED ORDER — PROMETHAZINE HCL 25 MG/ML IJ SOLN
12.5000 mg | Freq: Once | INTRAMUSCULAR | Status: AC
  Administered 2012-04-20: 12.5 mg via INTRAVENOUS
  Filled 2012-04-20 (×2): qty 1

## 2012-04-20 MED ORDER — DEXAMETHASONE SODIUM PHOSPHATE 10 MG/ML IJ SOLN
10.0000 mg | Freq: Once | INTRAMUSCULAR | Status: AC
  Administered 2012-04-20: 10 mg via INTRAVENOUS
  Filled 2012-04-20: qty 3
  Filled 2012-04-20: qty 2

## 2012-04-20 MED ORDER — DIPHENHYDRAMINE HCL 50 MG/ML IJ SOLN
25.0000 mg | Freq: Once | INTRAMUSCULAR | Status: AC
  Administered 2012-04-20: 25 mg via INTRAVENOUS
  Filled 2012-04-20: qty 1

## 2012-04-20 MED ORDER — ACETAMINOPHEN 500 MG PO TABS
1000.0000 mg | ORAL_TABLET | Freq: Once | ORAL | Status: DC
  Filled 2012-04-20: qty 2

## 2012-04-20 MED ORDER — METOCLOPRAMIDE HCL 5 MG/ML IJ SOLN
10.0000 mg | Freq: Once | INTRAMUSCULAR | Status: AC
  Administered 2012-04-20: 10 mg via INTRAVENOUS
  Filled 2012-04-20: qty 2

## 2012-04-20 NOTE — ED Provider Notes (Signed)
67 year old female has been having palpitations since yesterday. Palpitations will last as long as an hour and is described as a fluttering sensation. She denies chest pain, dyspnea, nausea, diaphoresis. She was seen at an urgent care center today and was told to come immediately to the hospital but no paperwork that came with her from the urgent care Center. In the ED, lungs are clear and heart has regular rate and rhythm without murmur. Monitor shows sinus rhythm without significant ectopy and ECG is normal. She will probably benefit from outpatient Holter monitor or event monitor. She has previously been seen by [facility] cardiology here so she is referred back to them.   Date: 04/20/2012  Rate: 87  Rhythm: sinus tachycardia  QRS Axis: normal  Intervals: normal  ST/T Wave abnormalities: nonspecific T wave changes  Conduction Disutrbances:none  Narrative Interpretation: Nonspecific T wave flattening in diffuse leads. When compared with ECG of 01/16/2010, no significant changes are seen  Old EKG Reviewed: unchanged  Medical screening examination/treatment/procedure(s) were conducted as a shared visit with non-physician practitioner(s) and myself.  I personally evaluated the patient during the encounter   Dione Booze, MD 04/20/12 1430

## 2012-04-20 NOTE — ED Provider Notes (Signed)
History     CSN: [id number]  Arrival date & time 04/20/12  1133   First MD Initiated Contact with Patient 04/20/12 1212      Chief Complaint  Patient presents with  . Shortness of Breath    (Consider location/radiation/quality/duration/timing/severity/associated sxs/prior treatment) HPI Comments: [name] is a 67 y.o. Female presenting for evaluation of palpitations which she describes as a fluttering sensation which lasted almost 2 hours this morning.  She was seen at an [facility] in [place] and was told to come immediately to the emergency room for further testing.  She has had similar symptoms for the past several years, with more frequent and longer lasting episodes over the past 2 weeks.  She reports sharp intermittent stabs of left sided chest pain along with tingling in her left finger tips and also reports a current headache.  She denies shortness of breath, nausea,  Diaphoresis and dizziness during these events.  Her last episode prior to today occurred 2 days ago.  She was seen by her pcp yesterday to discuss her ankle edema and for a followup of her chronic anxiety.  She did not discuss the palpitations at yesterdays visit.  She has taken no medicines prior to arrival.  She has recognized no triggers or alleviators of her symptoms. She does not drink caffeinated beverages.  She is treated for hypothyroidism and is unsure when her last thyroid levels were checked.     The history is provided by the patient and a relative.    Past Medical History  Diagnosis Date  . Anxiety   . Thyroid disease   . Morbid obesity   . PONV (postoperative nausea and vomiting)   . Asthma     enviromental allergies   . Recurrent upper respiratory infection (URI)   . GERD (gastroesophageal reflux disease)     h/o bleeding ulcer, hosp[name]  . Blood transfusion     /w bleeding ulcer & post knee replacement- 2012  . Neuralgia     spondylosis & stenosis  . Hiatal hernia    . Depression   . Hypothyroidism   . Chronic kidney disease     h/o renal calculi, h/o lithotripsy  . Sleep apnea     study- 2008- , CPAP not used in 3 months, states she has a new machine  but hasn't used     Past Surgical History  Procedure Laterality Date  . Gastric bypass    . Cholecystectomy    . Abdominal hysterectomy    . Appendectomy    . Eye surgery      bilateral cataracts removed, w/IOL  . Tonsillectomy      as a child  . Parathyroidectomy    . Cesarean section    . Peripherally inserted central catheter insertion      PICC line for treatment of MRSA  . Anterior cervical decomp/discectomy fusion  12/09/2010    Procedure: ANTERIOR CERVICAL DECOMPRESSION/DISCECTOMY FUSION 3 LEVELS;  Surgeon: Cristi Loron;  Location: [facility];  Service: Neurosurgery;  Laterality: N/A;  Cervical three-four,Cervical four-five,Cervical Five-Six,Cervical Six-Seven ANTERIOR CERVICAL DECOMPRESSION WITH FUSION INTERBODY PROTHESIS PLATING AND BONEGRAFT  . Joint replacement      07/2010 &  2002- respectively- both knees     Family History  Problem Relation Age of Onset  . Anesthesia problems Neg Hx   . Hypotension Neg Hx   . Malignant hyperthermia Neg Hx   . Pseudochol deficiency Neg Hx   .  Dementia Neg Hx     History  Substance Use Topics  . Smoking status: Former Smoker    Types: Cigarettes    Quit date: 01/19/1996  . Smokeless tobacco: Never Used  . Alcohol Use: No    OB History   Grav Para Term Preterm Abortions TAB SAB Ect Mult Living                  Review of Systems  Constitutional: Negative for fever.  HENT: Negative for congestion, sore throat and neck pain.   Eyes: Negative.   Respiratory: Negative for chest tightness and shortness of breath.   Cardiovascular: Positive for chest pain, palpitations and leg swelling.  Gastrointestinal: Negative for nausea and abdominal pain.  Genitourinary: Negative.   Musculoskeletal: Negative for joint swelling  and arthralgias.  Skin: Negative.  Negative for rash and wound.  Neurological: Negative for dizziness, weakness, light-headedness, numbness and headaches.  Psychiatric/Behavioral: The patient is nervous/anxious.     Allergies  Benzodiazepines; Codeine; Escitalopram oxalate; Sertraline hcl; and Statins  Home Medications   Current Outpatient Rx  Name  Route  Sig  Dispense  Refill  . amitriptyline (ELAVIL) 25 MG tablet   Oral   Take 1 tablet (25 mg total) by mouth at bedtime.   30 tablet   0   . Cyanocobalamin (VITAMIN B-12 IJ)   Injection   Inject as directed every 30 (thirty) days.           [name] esomeprazole (NEXIUM) 40 MG capsule   Oral   Take 40 mg by mouth 2 (two) times daily.           . furosemide (LASIX) 40 MG tablet   Oral   Take 40 mg by mouth daily.         [name] gabapentin (NEURONTIN) 100 MG capsule   Oral   Take 1 capsule (100 mg total) by mouth 4 (four) times daily. For getting off Xanax, may take up to TWO  4 times a day.   120 capsule   2   . HYDROcodone-acetaminophen (NORCO) 10-325 MG per tablet   Oral   Take 1 tablet by mouth 4 (four) times daily.         [name] levothyroxine (SYNTHROID, LEVOTHROID) 25 MCG tablet   Oral   Take 25 mcg by mouth daily.           . Multiple Vitamin (MULTIVITAMIN) tablet   Oral   Take 1 tablet by mouth daily.           . promethazine (PHENERGAN) 25 MG tablet   Oral   Take 25 mg by mouth every 6 (six) hours as needed for nausea.            BP 152/80  Pulse 78  Temp(Src) 98.7 F (37.1 C) (Oral)  Resp 18  Ht 5\' 6"  (1.676 m)  Wt 232 lb (105.235 kg)  BMI 37.46 kg/m2  SpO2 98%  Physical Exam  Nursing note and vitals reviewed. Constitutional: She is oriented to person, place, and time. She appears well-developed and well-nourished.  HENT:  Head: Normocephalic and atraumatic.  Eyes: Conjunctivae are normal.  Neck: Normal range of motion.  Cardiovascular: Normal rate, regular rhythm, normal heart sounds and  intact distal pulses.   Pulmonary/Chest: Effort normal and breath sounds normal. She has no wheezes.  Abdominal: Soft. Bowel sounds are normal. There is no tenderness.  Musculoskeletal: Normal range of motion.  Nonpitting ankle edema.  Neurological: She is  alert and oriented to person, place, and time. She has normal strength. No cranial nerve deficit or sensory deficit.  Skin: Skin is warm and dry.  Psychiatric: She has a normal mood and affect.    ED Course  Procedures (including critical care time)  Labs Reviewed  TSH - Abnormal; Notable for the following:    TSH 5.745 (*)    All other components within normal limits  CBC WITH DIFFERENTIAL - Abnormal; Notable for the following:    Hemoglobin 11.9 (*)    All other components within normal limits  BASIC METABOLIC PANEL - Abnormal; Notable for the following:    GFR calc non Af Amer 64 (*)    GFR calc Af Amer 74 (*)    All other components within normal limits  TROPONIN I   No results found.   1. Heart palpitations    Medications  promethazine (PHENERGAN) injection 12.5 mg (12.5 mg Intravenous Given 04/20/12 1310)  diphenhydrAMINE (BENADRYL) injection 25 mg (25 mg Intravenous Given 04/20/12 1452)  dexamethasone (DECADRON) injection 10 mg (10 mg Intravenous Given 04/20/12 1451)  metoCLOPramide (REGLAN) injection 10 mg (10 mg Intravenous Given 04/20/12 1451)      MDM  Patients labs and/or radiological studies were viewed and considered during the medical decision making and disposition process.   Pt with normal ekg,  And no ectopy seen while on monitor in ed.  Labs unremarkable and patient without palpitations while in ed.  She did have a headache for which she was given a migraine cocktail with improvement.  She was strongly encouraged to f/u with [facility] for consideration of event monitor -she has seen them in the past for a normal echo 1/12.      Date: 04/20/2012  Rate: 87  Rhythm: sinus tachycardia  QRS Axis: normal   Intervals: normal  ST/T Wave abnormalities: nonspecific T wave changes  Conduction Disutrbances:none  Narrative Interpretation: Nonspecific T wave flattening in diffuse leads. When compared with ECG of 01/16/2010, no significant changes are seen  Old EKG Reviewed: unchanged    Burgess Amor, PA-C 04/23/12 2054

## 2012-04-20 NOTE — ED Notes (Addendum)
Irregular heart beat x 2 hours , tingling of lt hand.  Sob.  Seen at Urgent Care today and told to come to ER.  Yesterday seen by her MD for bil leg swelling and anxiety

## 2012-05-01 ENCOUNTER — Encounter (INDEPENDENT_AMBULATORY_CARE_PROVIDER_SITE_OTHER): Payer: Self-pay | Admitting: General Surgery

## 2012-05-01 ENCOUNTER — Ambulatory Visit (INDEPENDENT_AMBULATORY_CARE_PROVIDER_SITE_OTHER): Payer: Medicare Other | Admitting: General Surgery

## 2012-05-01 ENCOUNTER — Other Ambulatory Visit (INDEPENDENT_AMBULATORY_CARE_PROVIDER_SITE_OTHER): Payer: Self-pay | Admitting: General Surgery

## 2012-05-01 VITALS — BP 158/90 | HR 60 | Temp 99.1°F | Resp 18 | Ht 66.0 in | Wt 230.6 lb

## 2012-05-01 DIAGNOSIS — E039 Hypothyroidism, unspecified: Secondary | ICD-10-CM

## 2012-05-01 NOTE — Progress Notes (Signed)
Patient ID: [name], female   DOB: 11-25-1945, 67 y.o.   MRN: [id number]  No chief complaint on file.   HPI [name] is a 67 y.o. female.   HPI  She is self referred to have her thyroid checked. She is status post right thyroid lobectomy and left inferior parathyroidectomy 06/06/2008. The operation was initially done because of hyperparathyroidism. A firm right thyroid nodule was found as well and this was removed. This was benign.  She had cervical decompression and discectomy November 2012 and thinks she was told that she may have some small abnormalities on her thyroid gland. She thus presents to have these looked at. She was recently in the emergency room for another reason and was noted to have a elevated TSH level as well. Her calcium level was within normal limits.  Past Medical History  Diagnosis Date  . Anxiety   . Thyroid disease   . Morbid obesity   . PONV (postoperative nausea and vomiting)   . Asthma     enviromental allergies   . Recurrent upper respiratory infection (URI)   . GERD (gastroesophageal reflux disease)     h/o bleeding ulcer, hosp., [name]  . Blood transfusion     /w bleeding ulcer & post knee replacement- 2012  . Neuralgia     spondylosis & stenosis  . Hiatal hernia   . Depression   . Hypothyroidism   . Chronic kidney disease     h/o renal calculi, h/o lithotripsy  . Sleep apnea     study- 2008- [place], CPAP not used in 3 months, states she has a new machine  but hasn't used     Past Surgical History  Procedure Laterality Date  . Gastric bypass    . Cholecystectomy    . Abdominal hysterectomy    . Appendectomy    . Eye surgery      bilateral cataracts removed, w/IOL  . Tonsillectomy      as a child  . Parathyroidectomy    . Cesarean section    . Peripherally inserted central catheter insertion      PICC line for treatment of MRSA  . Anterior cervical decomp/discectomy fusion  12/09/2010    Procedure: ANTERIOR CERVICAL  DECOMPRESSION/DISCECTOMY FUSION 3 LEVELS;  Surgeon: Cristi Loron;  Location: [facility];  Service: Neurosurgery;  Laterality: N/A;  Cervical three-four,Cervical four-five,Cervical Five-Six,Cervical Six-Seven ANTERIOR CERVICAL DECOMPRESSION WITH FUSION INTERBODY PROTHESIS PLATING AND BONEGRAFT  . Joint replacement      07/2010 &  2002- respectively- both knees     Family History  Problem Relation Age of Onset  . Anesthesia problems Neg Hx   . Hypotension Neg Hx   . Malignant hyperthermia Neg Hx   . Pseudochol deficiency Neg Hx   . Dementia Neg Hx     Social History History  Substance Use Topics  . Smoking status: Former Smoker    Types: Cigarettes    Quit date: 01/19/1996  . Smokeless tobacco: Never Used  . Alcohol Use: No    Allergies  Allergen Reactions  . Benzodiazepines Other (See Comments)    Cloudy thinking, memory loss, withdrawal symptoms when trying to stop without any other medication for detox.   . Codeine Nausea Only  . Escitalopram Oxalate   . Sertraline Hcl Nausea Only  . Statins Nausea And Vomiting    Current Outpatient Prescriptions  Medication Sig Dispense Refill  . Cyanocobalamin (VITAMIN B-12 IJ) Inject as directed every 30 (  thirty) days.        [name] esomeprazole (NEXIUM) 40 MG capsule Take 40 mg by mouth 2 (two) times daily.        . furosemide (LASIX) 40 MG tablet Take 40 mg by mouth daily.      [name] gabapentin (NEURONTIN) 100 MG capsule Take 1 capsule (100 mg total) by mouth 4 (four) times daily. For getting off Xanax, may take up to TWO  4 times a day.  120 capsule  2  . HYDROcodone-acetaminophen (NORCO) 10-325 MG per tablet Take 1 tablet by mouth 4 (four) times daily.      [name] levothyroxine (SYNTHROID, LEVOTHROID) 25 MCG tablet Take 25 mcg by mouth daily.        [name] LORazepam (ATIVAN) 0.5 MG tablet Take 0.5 mg by mouth every 8 (eight) hours. Takes .5 or 1 tablet q 4 hours prn.      . Multiple Vitamin (MULTIVITAMIN) tablet Take 1 tablet by mouth daily.         . promethazine (PHENERGAN) 25 MG tablet Take 25 mg by mouth every 6 (six) hours as needed for nausea.        No current facility-administered medications for this visit.    Review of Systems Review of Systems  Constitutional: Positive for fatigue.  Respiratory: Positive for shortness of breath.   Cardiovascular: Positive for palpitations.  Musculoskeletal: Positive for arthralgias.  Neurological: Positive for weakness.    Blood pressure 158/90, pulse 60, temperature 99.1 F (37.3 C), temperature source Temporal, resp. rate 18, height 5\' 6"  (1.676 m), weight 230 lb 9.6 oz (104.599 kg).  Physical Exam Physical Exam  Constitutional:  Overweight female in NAD.  HENT:  Head: Normocephalic and atraumatic.  Eyes: No scleral icterus.  Neck: Neck supple. No thyromegaly present.  2 lower transverse neck scars. No palpable masses present.  Lymphadenopathy:    She has no cervical adenopathy.  Skin: Skin is warm and dry.  Psychiatric:  Mood is depressed.  Affect is somewhat flat.    Data Reviewed Notes in old chart.  Dr. York Ram operative report ( no mention of thyroid abnormality).  Lab reports.  Assessment    Hypothyroidism. She has a left lobe of her thyroid gland remaining but obviously it is not producing enough thyroid hormone. I cannot palpate any thyroid nodules.     Plan    Double thyroid hormone replacement therapy 250 mcg a day. Repeat TSH level by her primary care physician in 3 weeks. Will obtain an ultrasound of the neck to evaluate the left lobe of the thyroid gland and discussed the results of this when we have them with her.        Terrea Bruster J 05/01/2012, 2:15 PM

## 2012-05-01 NOTE — Patient Instructions (Signed)
Take two Levothyroxine tablets daily ( 50 micrograms ) daily.  Get your TSH level checked in 3 weeks.

## 2012-05-02 ENCOUNTER — Encounter: Payer: Self-pay | Admitting: Adult Health

## 2012-05-02 ENCOUNTER — Telehealth (INDEPENDENT_AMBULATORY_CARE_PROVIDER_SITE_OTHER): Payer: Self-pay | Admitting: *Deleted

## 2012-05-02 NOTE — Telephone Encounter (Signed)
Patient called to state that [facility] MD told her yesterday to go up on her Levothyroxine from to however patient calls today to state she has already been on for quite some time.  Spoke to [employer] MD who states to have patient increase to daily.

## 2012-05-08 ENCOUNTER — Encounter (INDEPENDENT_AMBULATORY_CARE_PROVIDER_SITE_OTHER): Payer: Self-pay | Admitting: General Surgery

## 2012-05-08 ENCOUNTER — Encounter (HOSPITAL_COMMUNITY): Payer: Self-pay | Admitting: Diagnostic Radiology

## 2012-05-08 ENCOUNTER — Encounter: Payer: Self-pay | Admitting: Cardiovascular Disease

## 2012-05-08 ENCOUNTER — Ambulatory Visit (INDEPENDENT_AMBULATORY_CARE_PROVIDER_SITE_OTHER): Payer: Medicare Other | Admitting: Cardiovascular Disease

## 2012-05-08 ENCOUNTER — Ambulatory Visit (HOSPITAL_COMMUNITY)
Admission: RE | Admit: 2012-05-08 | Discharge: 2012-05-08 | Disposition: A | Discharge status: Home / Self Care | Payer: Medicare Other | Source: Ambulatory Visit | Attending: General Surgery | Admitting: General Surgery

## 2012-05-08 VITALS — BP 140/98 | HR 91 | Ht 65.0 in | Wt 236.0 lb

## 2012-05-08 DIAGNOSIS — E7800 Pure hypercholesterolemia, unspecified: Secondary | ICD-10-CM

## 2012-05-08 DIAGNOSIS — R946 Abnormal results of thyroid function studies: Secondary | ICD-10-CM | POA: Insufficient documentation

## 2012-05-08 DIAGNOSIS — E039 Hypothyroidism, unspecified: Secondary | ICD-10-CM | POA: Insufficient documentation

## 2012-05-08 DIAGNOSIS — R002 Palpitations: Secondary | ICD-10-CM

## 2012-05-08 DIAGNOSIS — E0789 Other specified disorders of thyroid: Secondary | ICD-10-CM | POA: Diagnosis not present

## 2012-05-08 DIAGNOSIS — I10 Essential (primary) hypertension: Secondary | ICD-10-CM

## 2012-05-08 NOTE — Patient Instructions (Addendum)
Your physician has requested that you have an echocardiogram. Echocardiography is a painless test that uses sound waves to create images of your heart. It provides your doctor with information about the size and shape of your heart and how well your heart's chambers and valves are working. This procedure takes approximately one hour. There are no restrictions for this procedure.   Schedule 30 day event monitor    Your physician recommends that you schedule a follow-up appointment in: 4 weeks

## 2012-05-08 NOTE — Assessment & Plan Note (Signed)
Well controlled.  Continue current medications and low sodium Dash type diet.    

## 2012-05-08 NOTE — Assessment & Plan Note (Signed)
Cholesterol is at goal.  Continue current dose of statin and diet Rx.  No myalgias or side effects.  F/U  LFT's in 6 months. No results found for this basename: LDLCALC             

## 2012-05-08 NOTE — Progress Notes (Unsigned)
Patient ID: [name], female   DOB: 12-22-1945, 67 y.o.   MRN: [id number] US demonstrates the left lobe of the thyroid gland is normal.  I explained this to her daughter who will explain it to her.

## 2012-05-08 NOTE — Progress Notes (Signed)
Patient ID: [name], female   DOB: 02-09-1945, 67 y.o.   MRN: [id number] 67 yo referred by Dr Sudie Bailey for dizzyness and palpitations Been going on for 4 months.  Not exertional Postional.  Feels flip flops.  She is sedentary.  Cares for husband who is partially paralyzed.  Affect seems flat.  Palpitations occuring daily.  If she has them long enough will feel lighheaded and also when she bends down.  No chest pain. Exertional dyspnea seems functional from obesity and being sedentary. Had hard cath at [facility] more than a decade ago for atypical chest pain and was normal.  Synthroid dose recently increased and TSH 4/14 was around 5  ROS: Denies fever, malais, weight loss, blurry vision, decreased visual acuity, cough, sputum, SOB, hemoptysis, pleuritic pain, palpitaitons, heartburn, abdominal pain, melena, lower extremity edema, claudication, or rash.  All other systems reviewed and negative   General: Affect appropriate Obese white female HEENT: normal Neck supple with no adenopathy JVP normal no bruits no thyromegaly Lungs clear with no wheezing and good diaphragmatic motion Heart:  S1/S2 no murmur,rub, gallop or click PMI normal Abdomen: benighn, BS positve, no tenderness, no AAA no bruit.  No HSM or HJR Distal pulses intact with no bruits No edema Neuro non-focal Skin warm and dry No muscular weakness  Medications Current Outpatient Prescriptions  Medication Sig Dispense Refill  . Cyanocobalamin (VITAMIN B-12 IJ) Inject as directed every 30 (thirty) days.        [name] esomeprazole (NEXIUM) 40 MG capsule Take 40 mg by mouth 3 (three) times daily.       . furosemide (LASIX) 40 MG tablet Take 40 mg by mouth as needed.       . gabapentin (NEURONTIN) 100 MG capsule Take 100 mg by mouth 3 (three) times daily. For getting off Xanax, may take up to TWO  4 times a day.      [name] HYDROcodone-acetaminophen (NORCO) 10-325 MG per tablet Take 1 tablet by mouth 4 (four) times daily.      [name] levothyroxine  (SYNTHROID, LEVOTHROID) 25 MCG tablet Take 75 mcg by mouth daily.       [name] LORazepam (ATIVAN) 0.5 MG tablet Take 0.5 mg by mouth every 8 (eight) hours. Takes .5 or 1 tablet q 4 hours prn.      . Multiple Vitamin (MULTIVITAMIN) tablet Take 1 tablet by mouth daily.        . promethazine (PHENERGAN) 25 MG tablet Take 25 mg by mouth every 6 (six) hours as needed for nausea.       . Vitamin D, Ergocalciferol, (DRISDOL) 50000 UNITS CAPS Take 50,000 Units by mouth every 7 (seven) days.       No current facility-administered medications for this visit.    Allergies Benzodiazepines; Codeine; Escitalopram oxalate; Sertraline hcl; and Statins  Family History: Family History  Problem Relation Age of Onset  . Anesthesia problems Neg Hx   . Hypotension Neg Hx   . Malignant hyperthermia Neg Hx   . Pseudochol deficiency Neg Hx   . Dementia Neg Hx     Social History: History   Social History  . Marital Status: Married    Spouse Name: N/A    Number of Children: N/A  . Years of Education: N/A   Occupational History  . Not on file.   Social History Main Topics  . Smoking status: Former Smoker    Types: Cigarettes    Quit date: 01/19/1996  . Smokeless tobacco: Never Used  .  Alcohol Use: No  . Drug Use: No  . Sexually Active: Not Currently    Birth Control/ Protection: Surgical   Other Topics Concern  . Not on file   Social History Narrative  . No narrative on file    Electrocardiogram:  04/19/12  SR rate 82  Sinus arrhythmia no PVC;s nonspecific ST/T wave changes  Assessment and Plan

## 2012-05-08 NOTE — Assessment & Plan Note (Signed)
She indicates daily symptoms so event monitor should be useful to correlate with symptoms  Echo to assess structural heart disease.  F/U in 4 weeks to further assess and see if beta blocker may be useful Not likely a serious cardiac issue here

## 2012-05-08 NOTE — Assessment & Plan Note (Signed)
TSH recently increased f/u TSH in a month

## 2012-05-09 ENCOUNTER — Ambulatory Visit (INDEPENDENT_AMBULATORY_CARE_PROVIDER_SITE_OTHER): Payer: Medicare Other | Admitting: Psychiatry

## 2012-05-09 ENCOUNTER — Encounter (HOSPITAL_COMMUNITY): Payer: Self-pay | Admitting: *Deleted

## 2012-05-09 ENCOUNTER — Inpatient Hospital Stay (HOSPITAL_COMMUNITY): Admission: RE | Admit: 2012-05-09 | Payer: Self-pay | Source: Ambulatory Visit

## 2012-05-09 ENCOUNTER — Encounter (HOSPITAL_COMMUNITY): Payer: Self-pay | Admitting: Psychiatry

## 2012-05-09 ENCOUNTER — Emergency Department (HOSPITAL_COMMUNITY): Payer: Medicare Other

## 2012-05-09 ENCOUNTER — Emergency Department (HOSPITAL_COMMUNITY)
Admission: EM | Admit: 2012-05-09 | Discharge: 2012-05-09 | Disposition: A | Discharge status: Home / Self Care | Payer: Medicare Other | Attending: Emergency Medicine | Admitting: Emergency Medicine

## 2012-05-09 VITALS — Wt 243.2 lb

## 2012-05-09 DIAGNOSIS — R109 Unspecified abdominal pain: Secondary | ICD-10-CM | POA: Insufficient documentation

## 2012-05-09 DIAGNOSIS — E039 Hypothyroidism, unspecified: Secondary | ICD-10-CM | POA: Insufficient documentation

## 2012-05-09 DIAGNOSIS — Z90710 Acquired absence of both cervix and uterus: Secondary | ICD-10-CM | POA: Insufficient documentation

## 2012-05-09 DIAGNOSIS — E079 Disorder of thyroid, unspecified: Secondary | ICD-10-CM | POA: Diagnosis not present

## 2012-05-09 DIAGNOSIS — Z87891 Personal history of nicotine dependence: Secondary | ICD-10-CM | POA: Diagnosis not present

## 2012-05-09 DIAGNOSIS — Z8709 Personal history of other diseases of the respiratory system: Secondary | ICD-10-CM | POA: Diagnosis not present

## 2012-05-09 DIAGNOSIS — G4730 Sleep apnea, unspecified: Secondary | ICD-10-CM | POA: Diagnosis not present

## 2012-05-09 DIAGNOSIS — T424X5D Adverse effect of benzodiazepines, subsequent encounter: Secondary | ICD-10-CM

## 2012-05-09 DIAGNOSIS — K219 Gastro-esophageal reflux disease without esophagitis: Secondary | ICD-10-CM | POA: Diagnosis not present

## 2012-05-09 DIAGNOSIS — Z8669 Personal history of other diseases of the nervous system and sense organs: Secondary | ICD-10-CM | POA: Insufficient documentation

## 2012-05-09 DIAGNOSIS — F411 Generalized anxiety disorder: Secondary | ICD-10-CM | POA: Insufficient documentation

## 2012-05-09 DIAGNOSIS — Z8739 Personal history of other diseases of the musculoskeletal system and connective tissue: Secondary | ICD-10-CM | POA: Insufficient documentation

## 2012-05-09 DIAGNOSIS — J45909 Unspecified asthma, uncomplicated: Secondary | ICD-10-CM | POA: Insufficient documentation

## 2012-05-09 DIAGNOSIS — R110 Nausea: Secondary | ICD-10-CM | POA: Diagnosis not present

## 2012-05-09 DIAGNOSIS — N189 Chronic kidney disease, unspecified: Secondary | ICD-10-CM | POA: Insufficient documentation

## 2012-05-09 DIAGNOSIS — Z79899 Other long term (current) drug therapy: Secondary | ICD-10-CM | POA: Diagnosis not present

## 2012-05-09 DIAGNOSIS — Z8719 Personal history of other diseases of the digestive system: Secondary | ICD-10-CM | POA: Insufficient documentation

## 2012-05-09 DIAGNOSIS — F329 Major depressive disorder, single episode, unspecified: Secondary | ICD-10-CM

## 2012-05-09 DIAGNOSIS — N390 Urinary tract infection, site not specified: Secondary | ICD-10-CM | POA: Insufficient documentation

## 2012-05-09 DIAGNOSIS — F341 Dysthymic disorder: Secondary | ICD-10-CM

## 2012-05-09 DIAGNOSIS — F5105 Insomnia due to other mental disorder: Secondary | ICD-10-CM

## 2012-05-09 DIAGNOSIS — R42 Dizziness and giddiness: Secondary | ICD-10-CM | POA: Insufficient documentation

## 2012-05-09 LAB — CBC WITH DIFFERENTIAL/PLATELET
Basophils Absolute: 0 10*3/uL (ref 0.0–0.1)
Basophils Relative: 1 % (ref 0–1)
Eosinophils Relative: 2 % (ref 0–5)
HCT: 39.1 % (ref 36.0–46.0)
Hemoglobin: 12.7 g/dL (ref 12.0–15.0)
MCHC: 32.5 g/dL (ref 30.0–36.0)
MCV: 82.3 fL (ref 78.0–100.0)
Monocytes Absolute: 0.5 10*3/uL (ref 0.1–1.0)
Monocytes Relative: 7 % (ref 3–12)
Neutro Abs: 4 10*3/uL (ref 1.7–7.7)
RDW: 15 % (ref 11.5–15.5)

## 2012-05-09 LAB — COMPREHENSIVE METABOLIC PANEL
Albumin: 4.3 g/dL (ref 3.5–5.2)
BUN: 16 mg/dL (ref 6–23)
CO2: 26 mEq/L (ref 19–32)
Calcium: 9.9 mg/dL (ref 8.4–10.5)
Chloride: 100 mEq/L (ref 96–112)
Creatinine, Ser: 0.8 mg/dL (ref 0.50–1.10)
GFR calc non Af Amer: 76 mL/min — ABNORMAL LOW (ref >90)
Total Bilirubin: 0.5 mg/dL (ref 0.3–1.2)

## 2012-05-09 LAB — URINALYSIS, ROUTINE W REFLEX MICROSCOPIC
Ketones, ur: NEGATIVE mg/dL
Nitrite: NEGATIVE
Protein, ur: NEGATIVE mg/dL

## 2012-05-09 LAB — TROPONIN I: Troponin I: <0.3 ng/mL (ref <0.30)

## 2012-05-09 LAB — LIPASE, BLOOD: Lipase: 24 U/L (ref 11–59)

## 2012-05-09 MED ORDER — HYDROXYZINE HCL 25 MG PO TABS: 25.0000 mg | ORAL_TABLET | Freq: Three times a day (TID) | ORAL | Status: DC | PRN

## 2012-05-09 MED ORDER — VENLAFAXINE HCL 37.5 MG PO TABS: 37.5000 mg | ORAL_TABLET | Freq: Two times a day (BID) | ORAL | Status: DC

## 2012-05-09 MED ORDER — SULFAMETHOXAZOLE-TRIMETHOPRIM 800-160 MG PO TABS: 1.0000 | ORAL_TABLET | Freq: Two times a day (BID) | ORAL | Status: DC

## 2012-05-09 MED ORDER — PROPRANOLOL HCL 10 MG PO TABS: 10.0000 mg | ORAL_TABLET | Freq: Three times a day (TID) | ORAL | Status: DC

## 2012-05-09 MED ORDER — METOCLOPRAMIDE HCL 10 MG PO TABS: 10.0000 mg | ORAL_TABLET | Freq: Four times a day (QID) | ORAL | Status: DC | PRN

## 2012-05-09 MED ORDER — METOCLOPRAMIDE HCL 5 MG/ML IJ SOLN
10.0000 mg | Freq: Once | INTRAMUSCULAR | Status: AC
  Administered 2012-05-09: 10 mg via INTRAVENOUS
  Filled 2012-05-09: qty 2

## 2012-05-09 MED ORDER — PANTOPRAZOLE SODIUM 40 MG IV SOLR
40.0000 mg | Freq: Once | INTRAVENOUS | Status: AC
  Administered 2012-05-09: 40 mg via INTRAVENOUS
  Filled 2012-05-09: qty 40

## 2012-05-09 MED ORDER — SODIUM CHLORIDE 0.9 % IV BOLUS (SEPSIS)
500.0000 mL | Freq: Once | INTRAVENOUS | Status: AC
  Administered 2012-05-09: 500 mL via INTRAVENOUS

## 2012-05-09 NOTE — ED Notes (Signed)
Pt not able to state what med, but took a new med and now with abd pain with N/V

## 2012-05-09 NOTE — ED Notes (Signed)
Pt alert & oriented x4, stable gait. Patient given discharge instructions, paperwork & prescription(s). Patient  instructed to stop at the registration desk to finish any additional paperwork. Patient verbalized understanding. Pt left department w/ no further questions. 

## 2012-05-09 NOTE — Patient Instructions (Addendum)
Relaxation is the ultimate solution for you.  You can seek it through tub baths, bubble baths, essential oils or incense, walking or chatting with friends, listening to soft music, watching a candle burn and just letting all thoughts go and appreciating the true essence of the Creator.  Pets or animals may be very helpful.  You might spend some time with them and then go do more directed meditation.  Set a timer for 8 or a certain number minutes and walk for that amount of time in the house or in the yard.  Mark the number of minutes on a calendar for that day.  Do that every day this week.  Then next week increase the time by 1 minutes and then mark the calendar with the number of minutes for that day.  Each week increase your exercise by one minute.  Keep a record of this so you can see the progress you are making.  Do this every day, just like eating and sleeping.  It is good for pain control, depression, and for your soul/spirit.  Bring the record in for your next visit so we can talk about your effort and how you feel with the new exercise program going and working for you.  Yoga is a very helpful exercise method.  On TV, on line, or by DVD Adelfa Koh is a source of high quality information about yoga and videos on yoga.  Renee Ramus is the world's number one video yoga instructor according to some experts.  There are exceptional health benefits that can be achieved through yoga.  The main principles of yoga is acceptance, no competition, no comparison, and no judgement.  It is exceptional in helping people meditate and get to a very relaxed state.   Meditation means getting in a quiet place physically, mentally, and emotionally so that your spiritual being can be open and willing to hear your creator's directions and love.  "I am Wishes Fulfilled Meditation" by Marylene Buerger and Lyndal Pulley may be helpful MUSIC for getting to sleep or for meditating You can order it from on line.  You might find the Chill  channel on Pandora and explore the artists that you like better.   Check out mindfulness at this web site.  VegetarianPizzas.hu  Could use "Move Free" or "Osteo bi Flex" for arthritic pain.   The important ingredients are Chondrotin Sulfate and Glucosamine.  Tumeric is also helpful for arthritis.   Krill oil and cod liver oil may be helpful for arthritis.   MegaChaga contains Oregeno and Chaga and seems to be very helpful  [employer] is a great source for all of these.  [phone]  Glori Luis is a mushroom that has the strongest antiinflammatory properties of any substance known to mankind.  Among other sources, it can be ordered from [facility].com  CUT BACK/CUT OUT on sugar and carbohydrates, that means very limited fruits and starchy vegetables and very limited grains, breads  The goal is low GLYCEMIC INDEX.  CUT OUT all wheat, rye, or barley for the GLUTEN in them.  HIGH fat and LOW carbohydrate diet is the KEY.  Eat avocados, eggs, lean meat like grass fed beef and chicken  Nuts and seeds would be good foods as well.   Stevia is an excellent sweetener.  Safe for the brain.   Lowella Grip is also a good safe sweetener, not the baking blend form of Truvia  Almond butter is awesome.  Check out all this on  the Internet.  Dr Heber Bozeman is on the Internet with some good info about this.   http://www.drperlmutter.com is where that is.  An excellent site for info on this diet is http://paleoleap.com  [employer] makes dark chocolate that is sweetened with Stevia that is safe.  Take care of yourself.  No one else is standing up to do the job and only you know what you need.   GET SERIOUS about taking care of yourself.  Do the next right thing and that often means doing something to care for yourself along the lines of are you hungry, are you angry, are you lonely, are you tired, are you scared?  HALTS is what that  stands for.  Call if problems or concerns.  Start Effexor at one a day for about 3 days then twice a day.

## 2012-05-09 NOTE — ED Notes (Signed)
Pt co nausea and abdominal pain, states she has hx of bleeding ulcer but has had normal BM

## 2012-05-09 NOTE — ED Provider Notes (Signed)
History  This chart was scribed for Kendra Racer, MD by [name], ED Scribe. The patient was seen in room APA01/APA01. Patient's care was started at 1501.   CSN: [id number]  Arrival date & time 05/09/12  1451   First MD Initiated Contact with Patient 05/09/12 1501      Chief Complaint  Patient presents with  . Nausea    Patient is a 67 y.o. female presenting with general illness. The history is provided by the patient. No language interpreter was used.  Illness  The current episode started yesterday. The problem occurs continuously. The problem has been unchanged. The problem is moderate. Nothing relieves the symptoms. Associated symptoms include nausea.     HPI Comments: [name] is a 67 y.o. female with history of GERD, chronic kidney disease, sleep apnea who presents to the Emergency Department complaining of persistent nausea onset last night that is worse today. Patient states that she came to [facility] to have an echocardiogram earlier today and was given Effefor, but nausea had been present well before. She states that she took Zofran about 30 minutes ago but it hasn't provided relief. Patient denies diarrhea, vomiting, chest pain, fever, chills, palpitations, leg swelling or shortness of breath. Patient has abdominal pain, but it is unchanged from baseline.    Past Medical History  Diagnosis Date  . Anxiety   . Thyroid disease   . Morbid obesity   . PONV (postoperative nausea and vomiting)   . Asthma     enviromental allergies   . Recurrent upper respiratory infection (URI)   . GERD (gastroesophageal reflux disease)     h/o bleeding ulcer, hosp., [name]  . Blood transfusion     /w bleeding ulcer & post knee replacement- 2012  . Neuralgia     spondylosis & stenosis  . Hiatal hernia   . Depression   . Hypothyroidism   . Chronic kidney disease     h/o renal calculi, h/o lithotripsy  . Sleep apnea     study- 2008[facility], CPAP not used in 3  months, states she has a new machine  but hasn't used   . Obsessive-compulsive disorder     Past Surgical History  Procedure Laterality Date  . Gastric bypass    . Cholecystectomy    . Abdominal hysterectomy    . Appendectomy    . Eye surgery      bilateral cataracts removed, w/IOL  . Tonsillectomy      as a child  . Parathyroidectomy    . Cesarean section    . Peripherally inserted central catheter insertion      PICC line for treatment of MRSA  . Anterior cervical decomp/discectomy fusion  12/09/2010    Procedure: ANTERIOR CERVICAL DECOMPRESSION/DISCECTOMY FUSION 3 LEVELS;  Surgeon: [name];  Location: [facility]UR[facility] OR[facility];  Service: Neurosurgery;  Laterality: N/A;  Cervical three-four,Cervical four-five,Cervical Five-Six,Cervical Six-Seven ANTERIOR CERVICAL DECOMPRESSION WITH FUSION INTERBODY PROTHESIS PLATING AND BONEGRAFT  . Joint replacement      07/2010 &  2002- respectively- both knees     Family History  Problem Relation Age of Onset  . Anesthesia problems Neg Hx   . Hypotension Neg Hx   . Malignant hyperthermia Neg Hx   . Pseudochol deficiency Neg Hx   . Dementia Neg Hx   . Alcohol abuse Neg Hx   . Drug abuse Neg Hx   . Depression Neg Hx   . Paranoid behavior Neg Hx   .  Schizophrenia Neg Hx   . Seizures Neg Hx   . Sexual abuse Neg Hx   . Physical abuse Neg Hx   . Anxiety disorder Mother   . Anxiety disorder Sister   . Bipolar disorder Daughter   . OCD Daughter   . ADD / ADHD Daughter     History  Substance Use Topics  . Smoking status: Former Smoker    Types: Cigarettes    Quit date: 01/19/1996  . Smokeless tobacco: Never Used  . Alcohol Use: No    OB History   Grav Para Term Preterm Abortions TAB SAB Ect Mult Living                  Review of Systems  Cardiovascular: Negative for leg swelling.  Gastrointestinal: Positive for nausea.  Neurological: Positive for light-headedness.  All other systems reviewed and are negative.    Allergies   Benzodiazepines; Codeine; Escitalopram oxalate; Sertraline hcl; and Statins  Home Medications   Current Outpatient Rx  Name  Route  Sig  Dispense  Refill  . Cyanocobalamin (VITAMIN B-12 IJ)   Injection   Inject as directed every 30 (thirty) days.           [name] esomeprazole (NEXIUM) 40 MG capsule   Oral   Take 40 mg by mouth 2 (two) times daily.          . furosemide (LASIX) 40 MG tablet   Oral   Take 40 mg by mouth daily as needed (for fluid retention).          . gabapentin (NEURONTIN) 100 MG capsule   Oral   Take 100 mg by mouth 3 (three) times daily. For getting off Xanax, may take up to TWO  4 times a day.         [name] HYDROcodone-acetaminophen (NORCO) 10-325 MG per tablet   Oral   Take 1 tablet by mouth 4 (four) times daily.         [name] levothyroxine (SYNTHROID, LEVOTHROID) 50 MCG tablet   Oral   Take 75 mcg by mouth daily.         . Multiple Vitamin (MULTIVITAMIN) tablet   Oral   Take 1 tablet by mouth daily.           . ondansetron (ZOFRAN) 4 MG tablet   Oral   Take 4 mg by mouth every 8 (eight) hours as needed for nausea.         . promethazine (PHENERGAN) 25 MG tablet   Oral   Take 25 mg by mouth every 6 (six) hours as needed for nausea.          [name] venlafaxine (EFFEXOR) 37.5 MG tablet   Oral   Take 1 tablet (37.5 mg total) by mouth 2 (two) times daily.   60 tablet   2   . Vitamin D, Ergocalciferol, (DRISDOL) 50000 UNITS CAPS   Oral   Take 50,000 Units by mouth every 7 (seven) days.         . hydrOXYzine (ATARAX/VISTARIL) 25 MG tablet   Oral   Take 1-2 tablets (25-50 mg total) by mouth 3 (three) times daily as needed for anxiety.   120 tablet   1   . metoCLOPramide (REGLAN) 10 MG tablet   Oral   Take 1 tablet (10 mg total) by mouth every 6 (six) hours as needed (nausea/headache).   20 tablet   0   . propranolol (INDERAL) 10 MG tablet  Oral   Take 1 tablet (10 mg total) by mouth 3 (three) times daily.   90 tablet   1   .  sulfamethoxazole-trimethoprim (BACTRIM DS,SEPTRA DS) 800-160 MG per tablet   Oral   Take 1 tablet by mouth 2 (two) times daily. One po bid x 3 days   14 tablet   0     Triage Vitals: BP 188/87  Pulse 89  Temp(Src) 97 F (36.1 C) (Oral)  Resp 18  SpO2 98%  Physical Exam  Constitutional: She is oriented to person, place, and time. She appears well-developed and well-nourished.  HENT:  Head: Normocephalic and atraumatic.  Eyes: Conjunctivae and EOM are normal. Pupils are equal, round, and reactive to light.  Neck: Normal range of motion. Neck supple.  Cardiovascular: Normal rate and regular rhythm.   Pulmonary/Chest: Effort normal and breath sounds normal.  Abdominal: Soft. There is tenderness (mild) in the epigastric area and left upper quadrant.  Musculoskeletal: Normal range of motion. She exhibits no edema.  No calf swelling or edema.   Neurological: She is alert and oriented to person, place, and time.  Skin: Skin is warm and dry.    ED Course  Procedures (including critical care time) DIAGNOSTIC STUDIES: Oxygen Saturation is 98% on room air, normal by my interpretation.    COORDINATION OF CARE: 3:10 PM- Patient presents with 1 day of nausea without vomiting or diarrhea. Will give Reglan 10 mg and 500 ml of IV fluids, and order abdominal x-ray, CBC with diff, CMP, lipase, UA and troponin. Patient and family informed of current plan for treatment and evaluation and agrees with plan at this time.   5:31 PM- Patient given Protonix 40 mg.   6:41 PM- Patient states that nausea is significantly improve and she is well appearing. UA is consistent with UTI. Plan to discharge with prescriptions for Reglan and antibiotics to treat UTI. Patient verbalizes understanding and agrees to plan.   Labs Reviewed  COMPREHENSIVE METABOLIC PANEL - Abnormal; Notable for the following:    Glucose, Bld 127 (*)    GFR calc non Af Amer 76 (*)    GFR calc Af Amer 88 (*)    All other components  within normal limits  URINALYSIS, ROUTINE W REFLEX MICROSCOPIC - Abnormal; Notable for the following:    APPearance HAZY (*)    Leukocytes, UA MODERATE (*)    All other components within normal limits  URINE MICROSCOPIC-ADD ON - Abnormal; Notable for the following:    Squamous Epithelial / LPF MANY (*)    Bacteria, UA MANY (*)    All other components within normal limits  URINE CULTURE  CBC WITH DIFFERENTIAL  LIPASE, BLOOD  TROPONIN I    Dg Abd Acute W/chest  05/09/2012  *RADIOLOGY REPORT*  Clinical Data: Abdominal pain, nausea and vomiting.  ACUTE ABDOMEN SERIES (ABDOMEN 2 VIEW & CHEST 1 VIEW)  Comparison: 04/20/2012 chest radiograph and 08/04/2010 abdominal radiograph  Findings: Minimal left basilar scarring is present. There is no evidence of airspace disease, pleural effusion, pneumothorax or pulmonary mass. The cardiomediastinal silhouette is unremarkable.  Moderate stool and gas in the colon is noted. Nondistended gas-filled loops of small bowel are present. There is no evidence of pneumoperitoneum. No suspicious calcifications are identified. Cholecystectomy clips and cervical surgical hardware again noted.  IMPRESSION: Nonspecific nonobstructive bowel gas pattern - no evidence of pneumoperitoneum.  Moderate stool and gas within the colon.  No evidence of acute cardiopulmonary disease.   Original Report Authenticated  By: Harmon Pier, M.D.      1. Urinary tract infection   2. Nausea      Date: 05/09/2012  Rate: 82  Rhythm: normal sinus rhythm  QRS Axis: normal  Intervals: normal  ST/T Wave abnormalities: nonspecific T wave changes  Conduction Disutrbances:none  Narrative Interpretation:   Old EKG Reviewed: unchanged     MDM  I personally performed the services described in this documentation, which was scribed in my presence. The recorded information has been reviewed and is accurate.  Pt feeling much better. F/u with PMD. Return precautions given.   Kendra Racer,  MD 05/09/12 [id number]
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
## 2012-05-09 NOTE — ED Notes (Signed)
Pt is poor historian; keeps saying that her daughter knows what she took, how tall she is and how much she weighs; pt agitated in triage room, stated that she took Zofran about 30 minutes ago

## 2012-05-10 LAB — URINE CULTURE: Colony Count: 70000

## 2012-05-15 ENCOUNTER — Other Ambulatory Visit (HOSPITAL_COMMUNITY): Payer: Self-pay

## 2012-05-19 ENCOUNTER — Ambulatory Visit (HOSPITAL_COMMUNITY): Admission: RE | Admit: 2012-05-19 | Payer: Medicare Other | Source: Ambulatory Visit

## 2012-05-24 ENCOUNTER — Ambulatory Visit (HOSPITAL_COMMUNITY)
Admission: RE | Admit: 2012-05-24 | Discharge: 2012-05-24 | Disposition: A | Discharge status: Home / Self Care | Payer: Medicare Other | Source: Ambulatory Visit | Attending: Family Medicine | Admitting: Family Medicine

## 2012-05-24 ENCOUNTER — Other Ambulatory Visit (HOSPITAL_COMMUNITY): Payer: Self-pay | Admitting: Family Medicine

## 2012-05-24 DIAGNOSIS — M545 Low back pain, unspecified: Secondary | ICD-10-CM | POA: Insufficient documentation

## 2012-05-25 ENCOUNTER — Ambulatory Visit (HOSPITAL_COMMUNITY)
Admission: RE | Admit: 2012-05-25 | Discharge: 2012-05-25 | Disposition: A | Discharge status: Home / Self Care | Payer: Medicare Other | Source: Ambulatory Visit | Attending: Cardiology | Admitting: Cardiology

## 2012-05-25 ENCOUNTER — Other Ambulatory Visit (HOSPITAL_COMMUNITY): Payer: Medicare Other

## 2012-05-25 ENCOUNTER — Ambulatory Visit (INDEPENDENT_AMBULATORY_CARE_PROVIDER_SITE_OTHER): Payer: Medicare Other | Admitting: *Deleted

## 2012-05-25 DIAGNOSIS — I10 Essential (primary) hypertension: Secondary | ICD-10-CM | POA: Diagnosis not present

## 2012-05-25 DIAGNOSIS — R42 Dizziness and giddiness: Secondary | ICD-10-CM | POA: Diagnosis not present

## 2012-05-25 DIAGNOSIS — R0989 Other specified symptoms and signs involving the circulatory and respiratory systems: Secondary | ICD-10-CM

## 2012-05-25 DIAGNOSIS — R002 Palpitations: Secondary | ICD-10-CM

## 2012-05-25 DIAGNOSIS — I517 Cardiomegaly: Secondary | ICD-10-CM

## 2012-05-29 ENCOUNTER — Ambulatory Visit: Payer: Self-pay | Admitting: Cardiovascular Disease

## 2012-06-06 ENCOUNTER — Ambulatory Visit (HOSPITAL_COMMUNITY): Payer: Self-pay | Admitting: Psychiatry

## 2012-06-13 ENCOUNTER — Other Ambulatory Visit (HOSPITAL_COMMUNITY): Payer: Self-pay | Admitting: Urology

## 2012-06-13 ENCOUNTER — Ambulatory Visit (HOSPITAL_COMMUNITY)
Admission: RE | Admit: 2012-06-13 | Discharge: 2012-06-13 | Disposition: A | Discharge status: Home / Self Care | Payer: Medicare Other | Source: Ambulatory Visit | Attending: Urology | Admitting: Urology

## 2012-06-13 DIAGNOSIS — N23 Unspecified renal colic: Secondary | ICD-10-CM

## 2012-06-13 DIAGNOSIS — R1031 Right lower quadrant pain: Secondary | ICD-10-CM | POA: Insufficient documentation

## 2012-06-13 DIAGNOSIS — Z87442 Personal history of urinary calculi: Secondary | ICD-10-CM | POA: Insufficient documentation

## 2012-06-15 ENCOUNTER — Ambulatory Visit (HOSPITAL_COMMUNITY): Payer: Medicare Other

## 2012-06-15 ENCOUNTER — Telehealth (HOSPITAL_COMMUNITY): Payer: Self-pay | Admitting: Psychiatry

## 2012-06-15 MED ORDER — HYDROXYZINE HCL 25 MG PO TABS: 25.0000 mg | ORAL_TABLET | Freq: Three times a day (TID) | ORAL | Status: DC | PRN

## 2012-06-15 NOTE — Telephone Encounter (Signed)
Hydroxyzine script rewritten via eScripts

## 2012-06-19 ENCOUNTER — Ambulatory Visit (HOSPITAL_COMMUNITY): Payer: Self-pay | Admitting: Psychiatry

## 2012-06-20 ENCOUNTER — Ambulatory Visit (HOSPITAL_COMMUNITY)
Admission: RE | Admit: 2012-06-20 | Discharge: 2012-06-20 | Disposition: A | Discharge status: Home / Self Care | Payer: Medicare Other | Source: Ambulatory Visit | Attending: Urology | Admitting: Urology

## 2012-06-20 DIAGNOSIS — N23 Unspecified renal colic: Secondary | ICD-10-CM

## 2012-06-20 DIAGNOSIS — R1031 Right lower quadrant pain: Secondary | ICD-10-CM | POA: Diagnosis not present

## 2012-06-20 DIAGNOSIS — Z9884 Bariatric surgery status: Secondary | ICD-10-CM | POA: Diagnosis not present

## 2012-06-26 ENCOUNTER — Other Ambulatory Visit: Payer: Self-pay | Admitting: *Deleted

## 2012-06-26 DIAGNOSIS — R002 Palpitations: Secondary | ICD-10-CM

## 2012-07-03 ENCOUNTER — Ambulatory Visit (HOSPITAL_COMMUNITY): Payer: Self-pay | Admitting: Psychiatry

## 2012-07-03 ENCOUNTER — Telehealth: Payer: Self-pay | Admitting: *Deleted

## 2012-07-03 NOTE — Telephone Encounter (Signed)
Pt can not make it to appt 07/05/12 with Dr Eden Emms. Can someone call her with heart monitor results?

## 2012-07-04 NOTE — Telephone Encounter (Signed)
.  left message to have patient return my call on [employer] mobile, also called pt home number and gave pt results from PN per EM, pt understood

## 2012-07-05 ENCOUNTER — Ambulatory Visit: Payer: Self-pay | Admitting: Cardiovascular Disease

## 2012-07-14 ENCOUNTER — Telehealth (HOSPITAL_COMMUNITY): Payer: Self-pay | Admitting: Psychiatry

## 2012-09-13 DIAGNOSIS — M545 Low back pain, unspecified: Secondary | ICD-10-CM | POA: Diagnosis not present

## 2012-10-12 ENCOUNTER — Encounter: Payer: Self-pay | Admitting: Cardiology

## 2012-10-12 NOTE — Progress Notes (Signed)
Clinical Summary [name] is a 67 y.o.female   1. Palpitations - started earlier this year  Past Medical History  Diagnosis Date  . Anxiety   . Thyroid disease   . Morbid obesity   . PONV (postoperative nausea and vomiting)   . Asthma     enviromental allergies   . Recurrent upper respiratory infection (URI)   . GERD (gastroesophageal reflux disease)     h/o bleeding ulcer, hosp., [name]  . Blood transfusion     /w bleeding ulcer & post knee replacement- 2012  . Neuralgia     spondylosis & stenosis  . Hiatal hernia   . Depression   . Hypothyroidism   . Chronic kidney disease     h/o renal calculi, h/o lithotripsy  . Sleep apnea     study- [facility], CPAP not used in 3 months, states she has a new machine  but hasn't used   . Obsessive-compulsive disorder      Allergies  Allergen Reactions  . Benzodiazepines Other (See Comments)    Cloudy thinking, memory loss, withdrawal symptoms when trying to stop without any other medication for detox.   . Codeine Nausea Only  . Escitalopram Oxalate   . Sertraline Hcl Nausea Only  . Statins Nausea And Vomiting     Current Outpatient Prescriptions  Medication Sig Dispense Refill  . Cyanocobalamin (VITAMIN B-12 IJ) Inject as directed every 30 (thirty) days.        [name] esomeprazole (NEXIUM) 40 MG capsule Take 40 mg by mouth 2 (two) times daily.       . furosemide (LASIX) 40 MG tablet Take 40 mg by mouth daily as needed (for fluid retention).       . gabapentin (NEURONTIN) 100 MG capsule Take 100 mg by mouth 3 (three) times daily. For getting off Xanax, may take up to TWO  4 times a day.      [name] HYDROcodone-acetaminophen (NORCO) 10-325 MG per tablet Take 1 tablet by mouth 4 (four) times daily.      . hydrOXYzine (ATARAX/VISTARIL) 25 MG tablet Take 1-2 tablets (25-50 mg total) by mouth 3 (three) times daily as needed for anxiety.  120 tablet  1  . levothyroxine (SYNTHROID, LEVOTHROID) 50 MCG tablet Take 75 mcg by  mouth daily.      . metoCLOPramide (REGLAN) 10 MG tablet Take 1 tablet (10 mg total) by mouth every 6 (six) hours as needed (nausea/headache).  20 tablet  0  . Multiple Vitamin (MULTIVITAMIN) tablet Take 1 tablet by mouth daily.        . ondansetron (ZOFRAN) 4 MG tablet Take 4 mg by mouth every 8 (eight) hours as needed for nausea.      . promethazine (PHENERGAN) 25 MG tablet Take 25 mg by mouth every 6 (six) hours as needed for nausea.       . propranolol (INDERAL) 10 MG tablet Take 1 tablet (10 mg total) by mouth 3 (three) times daily.  90 tablet  1  . sulfamethoxazole-trimethoprim (BACTRIM DS,SEPTRA DS) 800-160 MG per tablet Take 1 tablet by mouth 2 (two) times daily. One po bid x 3 days  14 tablet  0  . venlafaxine (EFFEXOR) 37.5 MG tablet Take 1 tablet (37.5 mg total) by mouth 2 (two) times daily.  60 tablet  2  . Vitamin D, Ergocalciferol, (DRISDOL) 50000 UNITS CAPS Take 50,000 Units by mouth every 7 (seven) days.       No current facility-administered  medications for this visit.     Past Surgical History  Procedure Laterality Date  . Gastric bypass    . Cholecystectomy    . Abdominal hysterectomy    . Appendectomy    . Eye surgery      bilateral cataracts removed, w/IOL  . Tonsillectomy      as a child  . Parathyroidectomy    . Cesarean section    . Peripherally inserted central catheter insertion      PICC line for treatment of MRSA  . Anterior cervical decomp/discectomy fusion  12/09/2010    Procedure: ANTERIOR CERVICAL DECOMPRESSION/DISCECTOMY FUSION 3 LEVELS;  Surgeon: Cristi Loron;  Location: [facility];  Service: Neurosurgery;  Laterality: N/A;  Cervical three-four,Cervical four-five,Cervical Five-Six,Cervical Six-Seven ANTERIOR CERVICAL DECOMPRESSION WITH FUSION INTERBODY PROTHESIS PLATING AND BONEGRAFT  . Joint replacement      07/2010 &  2002- respectively- both knees      Allergies  Allergen Reactions  . Benzodiazepines Other (See Comments)    Cloudy  thinking, memory loss, withdrawal symptoms when trying to stop without any other medication for detox.   . Codeine Nausea Only  . Escitalopram Oxalate   . Sertraline Hcl Nausea Only  . Statins Nausea And Vomiting      Family History  Problem Relation Age of Onset  . Anesthesia problems Neg Hx   . Hypotension Neg Hx   . Malignant hyperthermia Neg Hx   . Pseudochol deficiency Neg Hx   . Dementia Neg Hx   . Alcohol abuse Neg Hx   . Drug abuse Neg Hx   . Depression Neg Hx   . Paranoid behavior Neg Hx   . Schizophrenia Neg Hx   . Seizures Neg Hx   . Sexual abuse Neg Hx   . Physical abuse Neg Hx   . Anxiety disorder Mother   . Anxiety disorder Sister   . Bipolar disorder Daughter   . OCD Daughter   . ADD / ADHD Daughter      Social History Ms. Pottinger reports that she quit smoking about 16 years ago. Her smoking use included Cigarettes. She smoked 0.00 packs per day. She has never used smokeless tobacco. Ms. Salomone reports that she does not drink alcohol.   Review of Systems 12 point ROS negative other than reported in HPI  Physical Examination There were no vitals filed for this visit. There were no vitals filed for this visit.  Gen: resting comfortably, NAD HEENT: no scleral icterus, pupils equal round and reactive, no palptable cervical adenopathy CV Pulm: CTAB Abd: soft, NT, ND NABS, no hepatosplenomegaly Ext: warm, no edema.  Skin: warm, no rash Neuro: A&Ox3, no focal deficits    Diagnostic Studies 05/2012 Echo: LVEF 60-65%, no WMA, mild LAE,  06/2012 event monitor: NSR, PACs, no significant arrythmia   Assessment and Plan     Antoine Poche, M.D., F.A.C.C.

## 2012-10-18 DIAGNOSIS — M5430 Sciatica, unspecified side: Secondary | ICD-10-CM | POA: Diagnosis not present

## 2012-10-18 DIAGNOSIS — E538 Deficiency of other specified B group vitamins: Secondary | ICD-10-CM | POA: Diagnosis not present

## 2012-10-18 DIAGNOSIS — M545 Low back pain, unspecified: Secondary | ICD-10-CM | POA: Diagnosis not present

## 2012-10-30 ENCOUNTER — Encounter: Payer: Self-pay | Admitting: Cardiology

## 2012-10-30 NOTE — Progress Notes (Signed)
Clinical Summary Ms. Berti is a 67 y.o.female seen previously by Dr Eden Emms for palitations.   1. Palpitations - Echo showed normal LVEF, no significant pathology - Event monitor with no symptoms, NSR with occas PACs.   Past Medical History  Diagnosis Date  . Anxiety   . Thyroid disease   . Morbid obesity   . PONV (postoperative nausea and vomiting)   . Asthma     enviromental allergies   . Recurrent upper respiratory infection (URI)   . GERD (gastroesophageal reflux disease)     h/o bleeding ulcer, hosp[name]  . Blood transfusion     /w bleeding ulcer & post knee replacement- 2012  . Neuralgia     spondylosis & stenosis  . Hiatal hernia   . Depression   . Hypothyroidism   . Chronic kidney disease     h/o renal calculi, h/o lithotripsy  . Sleep apnea     [facility]- [facility], CPAP not used in 3 months, states she has a new machine  but hasn't used   . Obsessive-compulsive disorder      Allergies  Allergen Reactions  . Benzodiazepines Other (See Comments)    Cloudy thinking, memory loss, withdrawal symptoms when trying to stop without any other medication for detox.   . Codeine Nausea Only  . Escitalopram Oxalate   . Sertraline Hcl Nausea Only  . Statins Nausea And Vomiting     Current Outpatient Prescriptions  Medication Sig Dispense Refill  . Cyanocobalamin (VITAMIN B-12 IJ) Inject as directed every 30 (thirty) days.        [name] esomeprazole (NEXIUM) 40 MG capsule Take 40 mg by mouth 2 (two) times daily.       . furosemide (LASIX) 40 MG tablet Take 40 mg by mouth daily as needed (for fluid retention).       . gabapentin (NEURONTIN) 100 MG capsule Take 100 mg by mouth 3 (three) times daily. For getting off Xanax, may take up to TWO  4 times a day.      [name] HYDROcodone-acetaminophen (NORCO) 10-325 MG per tablet Take 1 tablet by mouth 4 (four) times daily.      . hydrOXYzine (ATARAX/VISTARIL) 25 MG tablet Take 1-2 tablets (25-50 mg total) by mouth 3  (three) times daily as needed for anxiety.  120 tablet  1  . levothyroxine (SYNTHROID, LEVOTHROID) 50 MCG tablet Take 75 mcg by mouth daily.      . metoCLOPramide (REGLAN) 10 MG tablet Take 1 tablet (10 mg total) by mouth every 6 (six) hours as needed (nausea/headache).  20 tablet  0  . Multiple Vitamin (MULTIVITAMIN) tablet Take 1 tablet by mouth daily.        . ondansetron (ZOFRAN) 4 MG tablet Take 4 mg by mouth every 8 (eight) hours as needed for nausea.      . promethazine (PHENERGAN) 25 MG tablet Take 25 mg by mouth every 6 (six) hours as needed for nausea.       . propranolol (INDERAL) 10 MG tablet Take 1 tablet (10 mg total) by mouth 3 (three) times daily.  90 tablet  1  . sulfamethoxazole-trimethoprim (BACTRIM DS,SEPTRA DS) 800-160 MG per tablet Take 1 tablet by mouth 2 (two) times daily. One po bid x 3 days  14 tablet  0  . venlafaxine (EFFEXOR) 37.5 MG tablet Take 1 tablet (37.5 mg total) by mouth 2 (two) times daily.  60 tablet  2  . Vitamin D,  Ergocalciferol, (DRISDOL) 50000 UNITS CAPS Take 50,000 Units by mouth every 7 (seven) days.       No current facility-administered medications for this visit.     Past Surgical History  Procedure Laterality Date  . Gastric bypass    . Cholecystectomy    . Abdominal hysterectomy    . Appendectomy    . Eye surgery      bilateral cataracts removed, w/IOL  . Tonsillectomy      as a child  . Parathyroidectomy    . Cesarean section    . Peripherally inserted central catheter insertion      PICC line for treatment of MRSA  . Anterior cervical decomp/discectomy fusion  12/09/2010    Procedure: ANTERIOR CERVICAL DECOMPRESSION/DISCECTOMY FUSION 3 LEVELS;  Surgeon: Cristi Loron;  Location: [facility];  Service: Neurosurgery;  Laterality: N/A;  Cervical three-four,Cervical four-five,Cervical Five-Six,Cervical Six-Seven ANTERIOR CERVICAL DECOMPRESSION WITH FUSION INTERBODY PROTHESIS PLATING AND BONEGRAFT  . Joint replacement      07/2010 &   2002- respectively- both knees      Allergies  Allergen Reactions  . Benzodiazepines Other (See Comments)    Cloudy thinking, memory loss, withdrawal symptoms when trying to stop without any other medication for detox.   . Codeine Nausea Only  . Escitalopram Oxalate   . Sertraline Hcl Nausea Only  . Statins Nausea And Vomiting      Family History  Problem Relation Age of Onset  . Anesthesia problems Neg Hx   . Hypotension Neg Hx   . Malignant hyperthermia Neg Hx   . Pseudochol deficiency Neg Hx   . Dementia Neg Hx   . Alcohol abuse Neg Hx   . Drug abuse Neg Hx   . Depression Neg Hx   . Paranoid behavior Neg Hx   . Schizophrenia Neg Hx   . Seizures Neg Hx   . Sexual abuse Neg Hx   . Physical abuse Neg Hx   . Anxiety disorder Mother   . Anxiety disorder Sister   . Bipolar disorder Daughter   . OCD Daughter   . ADD / ADHD Daughter      Social History Ms. Kirshenbaum reports that she quit smoking about 16 years ago. Her smoking use included Cigarettes. She smoked 0.00 packs per day. She has never used smokeless tobacco. Ms. Janvrin reports that she does not drink alcohol.   Review of Systems CONSTITUTIONAL: No weight loss, fever, chills, weakness or fatigue.  HEENT: Eyes: No visual loss, blurred vision, double vision or yellow sclerae.No hearing loss, sneezing, congestion, runny nose or sore throat.  SKIN: No rash or itching.  CARDIOVASCULAR:  RESPIRATORY: No shortness of breath, cough or sputum.  GASTROINTESTINAL: No anorexia, nausea, vomiting or diarrhea. No abdominal pain or blood.  GENITOURINARY: No burning on urination, no polyuria NEUROLOGICAL: No headache, dizziness, syncope, paralysis, ataxia, numbness or tingling in the extremities. No change in bowel or bladder control.  MUSCULOSKELETAL: No muscle, back pain, joint pain or stiffness.  LYMPHATICS: No enlarged nodes. No history of splenectomy.  PSYCHIATRIC: No history of depression or anxiety.  ENDOCRINOLOGIC: No  reports of sweating, cold or heat intolerance. No polyuria or polydipsia.  [name]   Physical Examination There were no vitals filed for this visit. There were no vitals filed for this visit.  Gen: resting comfortably, no acute distress HEENT: no scleral icterus, pupils equal round and reactive, no palptable cervical adenopathy,  CV Resp: Clear to auscultation bilaterally GI: abdomen is soft, non-tender, non-distended,  normal bowel sounds, no hepatosplenomegaly MSK: extremities are warm, no edema.  Skin: warm, no rash Neuro:  no focal deficits Psych: appropriate affect   Diagnostic Studies 05/2012 Echo: LVEF 60-65%, no WMA, mild LAE, mild to mod RVH,   06/2012 30 Day event: No symptoms reported, NSR, PACs  Assessment and Plan        Antoine Poche, M.D., F.A.C.C.

## 2012-11-01 DIAGNOSIS — M545 Low back pain, unspecified: Secondary | ICD-10-CM | POA: Diagnosis not present

## 2012-12-06 ENCOUNTER — Other Ambulatory Visit: Payer: Self-pay | Admitting: Neurosurgery

## 2012-12-22 ENCOUNTER — Encounter (HOSPITAL_COMMUNITY): Payer: Self-pay | Admitting: Pharmacy Technician

## 2012-12-25 ENCOUNTER — Encounter (HOSPITAL_COMMUNITY)
Admission: RE | Admit: 2012-12-25 | Discharge: 2012-12-25 | Disposition: A | Discharge status: Home / Self Care | Payer: Medicare Other | Source: Ambulatory Visit | Attending: Neurosurgery | Admitting: Neurosurgery

## 2012-12-25 ENCOUNTER — Encounter (HOSPITAL_COMMUNITY): Payer: Self-pay

## 2012-12-25 DIAGNOSIS — Z01812 Encounter for preprocedural laboratory examination: Secondary | ICD-10-CM | POA: Insufficient documentation

## 2012-12-25 DIAGNOSIS — Z01818 Encounter for other preprocedural examination: Secondary | ICD-10-CM | POA: Diagnosis not present

## 2012-12-25 HISTORY — DX: Shortness of breath: R06.02

## 2012-12-25 HISTORY — DX: Unspecified osteoarthritis, unspecified site: M19.90

## 2012-12-25 HISTORY — DX: Headache: R51

## 2012-12-25 LAB — BASIC METABOLIC PANEL
CO2: 26 mEq/L (ref 19–32)
Calcium: 9 mg/dL (ref 8.4–10.5)
Chloride: 102 mEq/L (ref 96–112)
GFR calc Af Amer: 79 mL/min — ABNORMAL LOW (ref >90)
GFR calc non Af Amer: 68 mL/min — ABNORMAL LOW (ref >90)
Glucose, Bld: 115 mg/dL — ABNORMAL HIGH (ref 70–99)
Potassium: 4.5 mEq/L (ref 3.5–5.1)
Sodium: 138 mEq/L (ref 135–145)

## 2012-12-25 LAB — CBC
HCT: 39.8 % (ref 36.0–46.0)
MCH: 27.8 pg (ref 26.0–34.0)
Platelets: 229 10*3/uL (ref 150–400)
RBC: 4.6 MIL/uL (ref 3.87–5.11)
WBC: 7.4 10*3/uL (ref 4.0–10.5)

## 2012-12-25 LAB — TYPE AND SCREEN
ABO/RH(D): A POS
Antibody Screen: NEGATIVE

## 2012-12-25 LAB — SURGICAL PCR SCREEN: Staphylococcus aureus: NEGATIVE

## 2012-12-25 LAB — ABO/RH: ABO/RH(D): A POS

## 2012-12-25 NOTE — Pre-Procedure Instructions (Signed)
IVA MONTELONGO  12/25/2012   Your procedure is scheduled on:  01-01-2013 @ 7:30 AM   Report to [facility]  2 * 3 at 5:30 AM.   Call this number if you have problems the morning of surgery: [phone]   Remember:   Do not eat food or drink liquids after midnight.    Take these medicines the morning of surgery with A SIP OF WATER: nexium,gabapenin(neurotin),pain medication,hydroxyzine if needed,levothyroxine(Synthroid),propranolol,effexor               STOP ALL ASPIRIN,nsaids,AND BLOOD THINNERS   Do not wear jewelry, make-up or nail polish.  Do not wear lotions, powders, or perfumes.   Do not shave 48 hours prior to surgery.   Do not bring valuables to the hospital.  [facility] is not responsible for any belongings or valuables.               Contacts, dentures or bridgework may not be worn into surgery.   Leave suitcase in the car. After surgery it may be brought to your room.  For patients admitted to the hospital, discharge time is determined by your  treatment team.               Patients discharged the day of surgery will not be allowed to drive home.     Special Instructions: Shower using CHG 2 nights before surgery and the night before surgery.  If you shower the day of surgery use CHG.  Use special wash - you have one bottle of CHG for all showers.  You should use approximately 1/3 of the bottle for each shower.    Please read over the following fact sheets that you were given: Pain Booklet, Coughing and Deep Breathing, Blood Transfusion Information and Surgical Site Infection Prevention

## 2012-12-27 DIAGNOSIS — F411 Generalized anxiety disorder: Secondary | ICD-10-CM | POA: Diagnosis not present

## 2012-12-27 DIAGNOSIS — I10 Essential (primary) hypertension: Secondary | ICD-10-CM | POA: Diagnosis not present

## 2012-12-27 DIAGNOSIS — M545 Low back pain, unspecified: Secondary | ICD-10-CM | POA: Diagnosis not present

## 2012-12-31 MED ORDER — CEFAZOLIN SODIUM-DEXTROSE 2-3 GM-% IV SOLR
2.0000 g | INTRAVENOUS | Status: AC
  Administered 2013-01-01: 2 g via INTRAVENOUS
  Filled 2012-12-31: qty 50

## 2013-01-01 ENCOUNTER — Inpatient Hospital Stay (HOSPITAL_COMMUNITY)
Admission: RE | Admit: 2013-01-01 | Discharge: 2013-01-06 | DRG: 460 | Disposition: A | Discharge status: Home / Self Care | Payer: Medicare Other | Source: Ambulatory Visit | Attending: Neurosurgery | Admitting: Neurosurgery

## 2013-01-01 ENCOUNTER — Encounter (HOSPITAL_COMMUNITY): Payer: Medicare Other | Admitting: Certified Registered Nurse Anesthetist

## 2013-01-01 ENCOUNTER — Inpatient Hospital Stay (HOSPITAL_COMMUNITY): Payer: Medicare Other

## 2013-01-01 ENCOUNTER — Encounter (HOSPITAL_COMMUNITY)
Admission: RE | Disposition: A | Discharge status: Home / Self Care | Payer: Medicare Other | Source: Ambulatory Visit | Attending: Neurosurgery

## 2013-01-01 ENCOUNTER — Encounter (HOSPITAL_COMMUNITY): Payer: Self-pay | Admitting: *Deleted

## 2013-01-01 ENCOUNTER — Inpatient Hospital Stay (HOSPITAL_COMMUNITY): Payer: Medicare Other | Admitting: Certified Registered Nurse Anesthetist

## 2013-01-01 DIAGNOSIS — E039 Hypothyroidism, unspecified: Secondary | ICD-10-CM | POA: Diagnosis present

## 2013-01-01 DIAGNOSIS — M5137 Other intervertebral disc degeneration, lumbosacral region: Secondary | ICD-10-CM | POA: Diagnosis not present

## 2013-01-01 DIAGNOSIS — N189 Chronic kidney disease, unspecified: Secondary | ICD-10-CM | POA: Diagnosis present

## 2013-01-01 DIAGNOSIS — G4730 Sleep apnea, unspecified: Secondary | ICD-10-CM | POA: Diagnosis present

## 2013-01-01 DIAGNOSIS — F329 Major depressive disorder, single episode, unspecified: Secondary | ICD-10-CM | POA: Diagnosis present

## 2013-01-01 DIAGNOSIS — Z96659 Presence of unspecified artificial knee joint: Secondary | ICD-10-CM

## 2013-01-01 DIAGNOSIS — M129 Arthropathy, unspecified: Secondary | ICD-10-CM | POA: Diagnosis present

## 2013-01-01 DIAGNOSIS — K219 Gastro-esophageal reflux disease without esophagitis: Secondary | ICD-10-CM | POA: Diagnosis present

## 2013-01-01 DIAGNOSIS — M48062 Spinal stenosis, lumbar region with neurogenic claudication: Secondary | ICD-10-CM | POA: Diagnosis not present

## 2013-01-01 DIAGNOSIS — F429 Obsessive-compulsive disorder, unspecified: Secondary | ICD-10-CM | POA: Diagnosis present

## 2013-01-01 DIAGNOSIS — M48061 Spinal stenosis, lumbar region without neurogenic claudication: Secondary | ICD-10-CM | POA: Diagnosis not present

## 2013-01-01 DIAGNOSIS — M539 Dorsopathy, unspecified: Secondary | ICD-10-CM | POA: Diagnosis not present

## 2013-01-01 DIAGNOSIS — Z9884 Bariatric surgery status: Secondary | ICD-10-CM

## 2013-01-01 DIAGNOSIS — IMO0002 Reserved for concepts with insufficient information to code with codable children: Secondary | ICD-10-CM | POA: Diagnosis not present

## 2013-01-01 DIAGNOSIS — M4310 Spondylolisthesis, site unspecified: Secondary | ICD-10-CM | POA: Diagnosis not present

## 2013-01-01 DIAGNOSIS — Z8614 Personal history of Methicillin resistant Staphylococcus aureus infection: Secondary | ICD-10-CM

## 2013-01-01 DIAGNOSIS — F411 Generalized anxiety disorder: Secondary | ICD-10-CM | POA: Diagnosis present

## 2013-01-01 DIAGNOSIS — F3289 Other specified depressive episodes: Secondary | ICD-10-CM | POA: Diagnosis present

## 2013-01-01 DIAGNOSIS — Z87891 Personal history of nicotine dependence: Secondary | ICD-10-CM

## 2013-01-01 DIAGNOSIS — Z6836 Body mass index (BMI) 36.0-36.9, adult: Secondary | ICD-10-CM

## 2013-01-01 DIAGNOSIS — M549 Dorsalgia, unspecified: Secondary | ICD-10-CM | POA: Diagnosis not present

## 2013-01-01 DIAGNOSIS — M4316 Spondylolisthesis, lumbar region: Secondary | ICD-10-CM

## 2013-01-01 SURGERY — POSTERIOR LUMBAR FUSION 1 LEVEL
Anesthesia: General

## 2013-01-01 MED ORDER — VENLAFAXINE HCL 37.5 MG PO TABS
37.5000 mg | ORAL_TABLET | Freq: Two times a day (BID) | ORAL | Status: DC
  Administered 2013-01-01 – 2013-01-06 (×10): 37.5 mg via ORAL
  Filled 2013-01-01 (×13): qty 1

## 2013-01-01 MED ORDER — ALUM & MAG HYDROXIDE-SIMETH 200-200-20 MG/5ML PO SUSP: 30.0000 mL | Freq: Four times a day (QID) | ORAL | Status: DC | PRN

## 2013-01-01 MED ORDER — FENTANYL CITRATE 0.05 MG/ML IJ SOLN
INTRAMUSCULAR | Status: DC | PRN
  Administered 2013-01-01: 250 ug via INTRAVENOUS

## 2013-01-01 MED ORDER — FUROSEMIDE 40 MG PO TABS
40.0000 mg | ORAL_TABLET | Freq: Every day | ORAL | Status: DC | PRN
  Filled 2013-01-01: qty 1

## 2013-01-01 MED ORDER — MORPHINE SULFATE (PF) 1 MG/ML IV SOLN
INTRAVENOUS | Status: DC
  Administered 2013-01-01: 12:00:00 via INTRAVENOUS

## 2013-01-01 MED ORDER — DIAZEPAM 5 MG PO TABS
5.0000 mg | ORAL_TABLET | Freq: Four times a day (QID) | ORAL | Status: DC | PRN
  Administered 2013-01-01 – 2013-01-05 (×9): 5 mg via ORAL
  Filled 2013-01-01 (×9): qty 1

## 2013-01-01 MED ORDER — OXYCODONE HCL 5 MG PO TABS
5.0000 mg | ORAL_TABLET | Freq: Once | ORAL | Status: AC | PRN
  Administered 2013-01-01: 5 mg via ORAL

## 2013-01-01 MED ORDER — 0.9 % SODIUM CHLORIDE (POUR BTL) OPTIME
TOPICAL | Status: DC | PRN
  Administered 2013-01-01: 1000 mL

## 2013-01-01 MED ORDER — METOCLOPRAMIDE HCL 10 MG PO TABS
10.0000 mg | ORAL_TABLET | Freq: Four times a day (QID) | ORAL | Status: DC | PRN
  Filled 2013-01-01: qty 1

## 2013-01-01 MED ORDER — OXYCODONE-ACETAMINOPHEN 5-325 MG PO TABS
1.0000 | ORAL_TABLET | ORAL | Status: DC | PRN
  Administered 2013-01-03 – 2013-01-06 (×8): 2 via ORAL
  Filled 2013-01-01 (×8): qty 2

## 2013-01-01 MED ORDER — LACTATED RINGERS IV SOLN
INTRAVENOUS | Status: DC | PRN
  Administered 2013-01-01 (×3): via INTRAVENOUS

## 2013-01-01 MED ORDER — ACETAMINOPHEN 325 MG PO TABS: 650.0000 mg | ORAL_TABLET | ORAL | Status: DC | PRN

## 2013-01-01 MED ORDER — SODIUM CHLORIDE 0.9 % IJ SOLN: 9.0000 mL | INTRAMUSCULAR | Status: DC | PRN

## 2013-01-01 MED ORDER — LEVOTHYROXINE SODIUM 75 MCG PO TABS
75.0000 ug | ORAL_TABLET | Freq: Every day | ORAL | Status: DC
  Administered 2013-01-02 – 2013-01-06 (×5): 75 ug via ORAL
  Filled 2013-01-01 (×6): qty 1

## 2013-01-01 MED ORDER — ONDANSETRON HCL 4 MG/2ML IJ SOLN
4.0000 mg | INTRAMUSCULAR | Status: DC | PRN
  Administered 2013-01-01 – 2013-01-04 (×10): 4 mg via INTRAVENOUS
  Filled 2013-01-01 (×10): qty 2

## 2013-01-01 MED ORDER — DOCUSATE SODIUM 100 MG PO CAPS
100.0000 mg | ORAL_CAPSULE | Freq: Two times a day (BID) | ORAL | Status: DC
  Administered 2013-01-01 – 2013-01-06 (×10): 100 mg via ORAL
  Filled 2013-01-01 (×10): qty 1

## 2013-01-01 MED ORDER — SUCCINYLCHOLINE CHLORIDE 20 MG/ML IJ SOLN
INTRAMUSCULAR | Status: DC | PRN
  Administered 2013-01-01: 120 mg via INTRAVENOUS

## 2013-01-01 MED ORDER — PHENOL 1.4 % MT LIQD: 1.0000 | OROMUCOSAL | Status: DC | PRN

## 2013-01-01 MED ORDER — PROPOFOL 10 MG/ML IV BOLUS
INTRAVENOUS | Status: DC | PRN
  Administered 2013-01-01: 120 mg via INTRAVENOUS

## 2013-01-01 MED ORDER — ACETAMINOPHEN 650 MG RE SUPP: 650.0000 mg | RECTAL | Status: DC | PRN

## 2013-01-01 MED ORDER — PANTOPRAZOLE SODIUM 40 MG PO TBEC
80.0000 mg | DELAYED_RELEASE_TABLET | Freq: Every day | ORAL | Status: DC
  Administered 2013-01-02: 40 mg via ORAL
  Administered 2013-01-03 – 2013-01-06 (×4): 80 mg via ORAL
  Filled 2013-01-01 (×5): qty 2

## 2013-01-01 MED ORDER — HYDROXYZINE HCL 25 MG PO TABS
25.0000 mg | ORAL_TABLET | Freq: Three times a day (TID) | ORAL | Status: DC | PRN
  Administered 2013-01-06: 25 mg via ORAL
  Filled 2013-01-01: qty 1

## 2013-01-01 MED ORDER — SODIUM CHLORIDE 0.9 % IR SOLN
Status: DC | PRN
  Administered 2013-01-01: 09:00:00

## 2013-01-01 MED ORDER — OXYCODONE HCL 5 MG/5ML PO SOLN: 5.0000 mg | Freq: Once | ORAL | Status: AC | PRN

## 2013-01-01 MED ORDER — VITAMIN D (ERGOCALCIFEROL) 1.25 MG (50000 UNIT) PO CAPS
50000.0000 [IU] | ORAL_CAPSULE | ORAL | Status: DC
  Administered 2013-01-02: 50000 [IU] via ORAL
  Filled 2013-01-01: qty 1

## 2013-01-01 MED ORDER — HYDROCODONE-ACETAMINOPHEN 10-325 MG PO TABS
1.0000 | ORAL_TABLET | Freq: Four times a day (QID) | ORAL | Status: DC | PRN
  Administered 2013-01-01 – 2013-01-06 (×11): 1 via ORAL
  Filled 2013-01-01 (×11): qty 1

## 2013-01-01 MED ORDER — EPHEDRINE SULFATE 50 MG/ML IJ SOLN
INTRAMUSCULAR | Status: DC | PRN
  Administered 2013-01-01 (×2): 20 mg via INTRAVENOUS

## 2013-01-01 MED ORDER — ONDANSETRON HCL 4 MG/2ML IJ SOLN
INTRAMUSCULAR | Status: DC | PRN
  Administered 2013-01-01 (×2): 4 mg via INTRAVENOUS

## 2013-01-01 MED ORDER — MENTHOL 3 MG MT LOZG: 1.0000 | LOZENGE | OROMUCOSAL | Status: DC | PRN

## 2013-01-01 MED ORDER — BUPIVACAINE-EPINEPHRINE PF 0.5-1:200000 % IJ SOLN
INTRAMUSCULAR | Status: DC | PRN
  Administered 2013-01-01: 10 mL
  Administered 2013-01-01: 20 mL

## 2013-01-01 MED ORDER — NALOXONE HCL 0.4 MG/ML IJ SOLN: 0.4000 mg | INTRAMUSCULAR | Status: DC | PRN

## 2013-01-01 MED ORDER — NEOSTIGMINE METHYLSULFATE 1 MG/ML IJ SOLN
INTRAMUSCULAR | Status: DC | PRN
  Administered 2013-01-01: 4 mg via INTRAVENOUS

## 2013-01-01 MED ORDER — HYDROMORPHONE HCL PF 1 MG/ML IJ SOLN
0.2500 mg | INTRAMUSCULAR | Status: DC | PRN
  Administered 2013-01-01 (×2): 0.5 mg via INTRAVENOUS

## 2013-01-01 MED ORDER — MEPERIDINE HCL 25 MG/ML IJ SOLN: 6.2500 mg | INTRAMUSCULAR | Status: DC | PRN

## 2013-01-01 MED ORDER — CEFAZOLIN SODIUM-DEXTROSE 2-3 GM-% IV SOLR
2.0000 g | Freq: Three times a day (TID) | INTRAVENOUS | Status: AC
  Administered 2013-01-01 (×2): 2 g via INTRAVENOUS
  Filled 2013-01-01 (×2): qty 50

## 2013-01-01 MED ORDER — LACTATED RINGERS IV SOLN
INTRAVENOUS | Status: DC
  Administered 2013-01-01 – 2013-01-02 (×2): via INTRAVENOUS

## 2013-01-01 MED ORDER — ZOLPIDEM TARTRATE 5 MG PO TABS
5.0000 mg | ORAL_TABLET | Freq: Every evening | ORAL | Status: DC | PRN
  Administered 2013-01-02 – 2013-01-03 (×2): 5 mg via ORAL
  Filled 2013-01-01 (×2): qty 1

## 2013-01-01 MED ORDER — DIPHENHYDRAMINE HCL 50 MG/ML IJ SOLN
12.5000 mg | Freq: Four times a day (QID) | INTRAMUSCULAR | Status: DC | PRN
  Administered 2013-01-01 – 2013-01-02 (×2): 12.5 mg via INTRAVENOUS
  Filled 2013-01-01 (×2): qty 1

## 2013-01-01 MED ORDER — MORPHINE SULFATE (PF) 1 MG/ML IV SOLN
INTRAVENOUS | Status: AC
  Filled 2013-01-01: qty 25

## 2013-01-01 MED ORDER — GLYCOPYRROLATE 0.2 MG/ML IJ SOLN
INTRAMUSCULAR | Status: DC | PRN
  Administered 2013-01-01: .8 mg via INTRAVENOUS

## 2013-01-01 MED ORDER — DIPHENHYDRAMINE HCL 12.5 MG/5ML PO ELIX
12.5000 mg | ORAL_SOLUTION | Freq: Four times a day (QID) | ORAL | Status: DC | PRN
  Administered 2013-01-02 – 2013-01-05 (×3): 12.5 mg via ORAL
  Filled 2013-01-01 (×3): qty 10

## 2013-01-01 MED ORDER — GABAPENTIN 100 MG PO CAPS
100.0000 mg | ORAL_CAPSULE | Freq: Three times a day (TID) | ORAL | Status: DC
  Administered 2013-01-01 – 2013-01-06 (×14): 100 mg via ORAL
  Filled 2013-01-01 (×18): qty 1

## 2013-01-01 MED ORDER — OXYCODONE HCL 5 MG PO TABS
ORAL_TABLET | ORAL | Status: AC
  Filled 2013-01-01: qty 1

## 2013-01-01 MED ORDER — PROPRANOLOL HCL 10 MG PO TABS
10.0000 mg | ORAL_TABLET | Freq: Three times a day (TID) | ORAL | Status: DC
  Administered 2013-01-02 – 2013-01-06 (×10): 10 mg via ORAL
  Filled 2013-01-01 (×18): qty 1

## 2013-01-01 MED ORDER — BACITRACIN ZINC 500 UNIT/GM EX OINT
TOPICAL_OINTMENT | CUTANEOUS | Status: DC | PRN
  Administered 2013-01-01: 1 via TOPICAL

## 2013-01-01 MED ORDER — LIDOCAINE HCL (CARDIAC) 20 MG/ML IV SOLN
INTRAVENOUS | Status: DC | PRN
  Administered 2013-01-01: 40 mg via INTRAVENOUS

## 2013-01-01 MED ORDER — ONDANSETRON HCL 4 MG/2ML IJ SOLN: 4.0000 mg | Freq: Four times a day (QID) | INTRAMUSCULAR | Status: DC | PRN

## 2013-01-01 MED ORDER — PROMETHAZINE HCL 25 MG/ML IJ SOLN: 6.2500 mg | INTRAMUSCULAR | Status: DC | PRN

## 2013-01-01 MED ORDER — ADULT MULTIVITAMIN W/MINERALS CH
1.0000 | ORAL_TABLET | Freq: Every day | ORAL | Status: DC
  Administered 2013-01-02 – 2013-01-05 (×4): 1 via ORAL
  Filled 2013-01-01 (×6): qty 1

## 2013-01-01 MED ORDER — ROCURONIUM BROMIDE 100 MG/10ML IV SOLN
INTRAVENOUS | Status: DC | PRN
  Administered 2013-01-01: 10 mg via INTRAVENOUS
  Administered 2013-01-01: 30 mg via INTRAVENOUS
  Administered 2013-01-01: 10 mg via INTRAVENOUS

## 2013-01-01 MED ORDER — HYDROMORPHONE HCL PF 1 MG/ML IJ SOLN
INTRAMUSCULAR | Status: AC
  Filled 2013-01-01: qty 2

## 2013-01-01 MED ORDER — SURGIFOAM 100 EX MISC
CUTANEOUS | Status: DC | PRN
  Administered 2013-01-01: 09:00:00 via TOPICAL

## 2013-01-01 SURGICAL SUPPLY — 65 items
APL SKNCLS STERI-STRIP NONHPOA (GAUZE/BANDAGES/DRESSINGS) ×1
BAG DECANTER FOR FLEXI CONT (MISCELLANEOUS) ×2 IMPLANT
BENZOIN TINCTURE PRP APPL 2/3 (GAUZE/BANDAGES/DRESSINGS) ×2 IMPLANT
BLADE SURG ROTATE 9660 (MISCELLANEOUS) IMPLANT
BRUSH SCRUB EZ PLAIN DRY (MISCELLANEOUS) ×2 IMPLANT
BUR ACORN 6.0 (BURR) ×2 IMPLANT
BUR MATCHSTICK NEURO 3.0 LAGG (BURR) ×2 IMPLANT
CANISTER SUCT 3000ML (MISCELLANEOUS) ×2 IMPLANT
CAP REVERE LOCKING (Cap) ×4 IMPLANT
CONT SPEC 4OZ CLIKSEAL STRL BL (MISCELLANEOUS) ×2 IMPLANT
COVER BACK TABLE 24X17X13 BIG (DRAPES) IMPLANT
COVER TABLE BACK 60X90 (DRAPES) ×2 IMPLANT
DRAPE C-ARM 42X72 X-RAY (DRAPES) ×4 IMPLANT
DRAPE LAPAROTOMY 100X72X124 (DRAPES) ×2 IMPLANT
DRAPE POUCH INSTRU U-SHP 10X18 (DRAPES) ×2 IMPLANT
DRAPE PROXIMA HALF (DRAPES) ×2 IMPLANT
DRAPE SURG 17X23 STRL (DRAPES) ×8 IMPLANT
ELECT BLADE 4.0 EZ CLEAN MEGAD (MISCELLANEOUS) ×2
ELECT REM PT RETURN 9FT ADLT (ELECTROSURGICAL) ×2
ELECTRODE BLDE 4.0 EZ CLN MEGD (MISCELLANEOUS) ×1 IMPLANT
ELECTRODE REM PT RTRN 9FT ADLT (ELECTROSURGICAL) ×1 IMPLANT
EVACUATOR 1/8 PVC DRAIN (DRAIN) ×1 IMPLANT
GAUZE SPONGE 4X4 16PLY XRAY LF (GAUZE/BANDAGES/DRESSINGS) ×2 IMPLANT
GLOVE BIO SURGEON STRL SZ8.5 (GLOVE) ×4 IMPLANT
GLOVE BIOGEL PI IND STRL 8 (GLOVE) IMPLANT
GLOVE BIOGEL PI INDICATOR 8 (GLOVE) ×2
GLOVE ECLIPSE 7.5 STRL STRAW (GLOVE) ×3 IMPLANT
GLOVE EXAM NITRILE LRG STRL (GLOVE) IMPLANT
GLOVE EXAM NITRILE MD LF STRL (GLOVE) ×2 IMPLANT
GLOVE EXAM NITRILE XL STR (GLOVE) IMPLANT
GLOVE EXAM NITRILE XS STR PU (GLOVE) IMPLANT
GLOVE SS BIOGEL STRL SZ 8 (GLOVE) ×2 IMPLANT
GLOVE SUPERSENSE BIOGEL SZ 8 (GLOVE) ×2
GOWN BRE IMP SLV AUR LG STRL (GOWN DISPOSABLE) IMPLANT
GOWN BRE IMP SLV AUR XL STRL (GOWN DISPOSABLE) ×5 IMPLANT
GOWN STRL REIN 2XL LVL4 (GOWN DISPOSABLE) IMPLANT
KIT BASIN OR (CUSTOM PROCEDURE TRAY) ×2 IMPLANT
KIT ROOM TURNOVER OR (KITS) ×2 IMPLANT
NDL HYPO 21X1.5 SAFETY (NEEDLE) IMPLANT
NEEDLE HYPO 21X1.5 SAFETY (NEEDLE) IMPLANT
NEEDLE HYPO 22GX1.5 SAFETY (NEEDLE) ×2 IMPLANT
NS IRRIG 1000ML POUR BTL (IV SOLUTION) ×2 IMPLANT
PACK FOAM VITOSS 10CC (Orthopedic Implant) ×1 IMPLANT
PACK LAMINECTOMY NEURO (CUSTOM PROCEDURE TRAY) ×2 IMPLANT
PAD ARMBOARD 7.5X6 YLW CONV (MISCELLANEOUS) ×6 IMPLANT
PATTIES SURGICAL .5 X1 (DISPOSABLE) IMPLANT
PUTTY 10ML ACTIFUSE ABX (Putty) ×1 IMPLANT
ROD REVERE 6.35 40MM (Rod) ×2 IMPLANT
SCREW 7.5X50MM (Screw) ×4 IMPLANT
SPACER SUSTAIN O 10X26 12MM (Spacer) ×2 IMPLANT
SPONGE GAUZE 4X4 12PLY (GAUZE/BANDAGES/DRESSINGS) ×2 IMPLANT
SPONGE LAP 4X18 X RAY DECT (DISPOSABLE) IMPLANT
SPONGE NEURO XRAY DETECT 1X3 (DISPOSABLE) IMPLANT
SPONGE SURGIFOAM ABS GEL 100 (HEMOSTASIS) ×2 IMPLANT
STRIP CLOSURE SKIN 1/2X4 (GAUZE/BANDAGES/DRESSINGS) ×2 IMPLANT
SUT VIC AB 1 CT1 18XBRD ANBCTR (SUTURE) ×2 IMPLANT
SUT VIC AB 1 CT1 8-18 (SUTURE) ×4
SUT VIC AB 2-0 CP2 18 (SUTURE) ×4 IMPLANT
SYR 20CC LL (SYRINGE) IMPLANT
SYR 20ML ECCENTRIC (SYRINGE) ×2 IMPLANT
TAPE CLOTH SURG 4X10 WHT LF (GAUZE/BANDAGES/DRESSINGS) ×1 IMPLANT
TOWEL OR 17X24 6PK STRL BLUE (TOWEL DISPOSABLE) ×2 IMPLANT
TOWEL OR 17X26 10 PK STRL BLUE (TOWEL DISPOSABLE) ×2 IMPLANT
TRAY FOLEY CATH 14FRSI W/METER (CATHETERS) ×2 IMPLANT
WATER STERILE IRR 1000ML POUR (IV SOLUTION) ×2 IMPLANT

## 2013-01-01 NOTE — Progress Notes (Signed)
Patient's Morphine PCA is d/c'd.

## 2013-01-01 NOTE — H&P (Signed)
Subjective: The patient is a 67 year old white female who has complained of chronic back, buttock, and leg pain consistent with neurogenic claudication. She has failed medical management and was worked up with a lumbar MRI and a lumbar x-rays. This demonstrated a spondylolisthesis and spinal stenosis at L4-5. I discussed the various treatment options with the patient including surgery. She has weighed the risks, benefits, and alternatives surgery and decided proceed with an L4-5 decompression, instrumentation, and fusion.   Past Medical History  Diagnosis Date  . Anxiety   . Thyroid disease   . Morbid obesity   . PONV (postoperative nausea and vomiting)   . Recurrent upper respiratory infection (URI)   . GERD (gastroesophageal reflux disease)     h/o bleeding ulcer, hosp[name]  . Blood transfusion     /w bleeding ulcer & post knee replacement- 2012  . Neuralgia     spondylosis & stenosis  . Hiatal hernia   . Depression   . Hypothyroidism   . Chronic kidney disease     h/o renal calculi, h/o lithotripsy  . Obsessive-compulsive disorder   . Sleep apnea     study- [facility], CPAP not used in 3 months, states she has a new machine  but hasn't used   . Shortness of breath     with exertion  . Headache(784.0)   . Arthritis     Past Surgical History  Procedure Laterality Date  . Gastric bypass    . Cholecystectomy    . Abdominal hysterectomy    . Appendectomy    . Eye surgery      bilateral cataracts removed, w/IOL  . Tonsillectomy      as a child  . Parathyroidectomy    . Cesarean section    . Peripherally inserted central catheter insertion      PICC line for treatment of MRSA  . Anterior cervical decomp/discectomy fusion  12/09/2010    Procedure: ANTERIOR CERVICAL DECOMPRESSION/DISCECTOMY FUSION 3 LEVELS;  Surgeon: Cristi Loron;  Location: [facility];  Service: Neurosurgery;  Laterality: N/A;  Cervical three-four,Cervical four-five,Cervical  Five-Six,Cervical Six-Seven ANTERIOR CERVICAL DECOMPRESSION WITH FUSION INTERBODY PROTHESIS PLATING AND BONEGRAFT  . Joint replacement      07/2010 &  2002- respectively- both knees     Allergies  Allergen Reactions  . Benzodiazepines Other (See Comments)    Cloudy thinking, memory loss, withdrawal symptoms when trying to stop without any other medication for detox.   . Codeine Nausea Only  . Escitalopram Oxalate Nausea Only  . Nsaids Other (See Comments)    Bleeding ulcer   . Sertraline Hcl Nausea Only  . Statins Nausea And Vomiting  . Tramadol Hcl Other (See Comments)    Daughter doesn't want mother to take due to the fact that the medication made the daughter have a seizure     History  Substance Use Topics  . Smoking status: Former Smoker    Types: Cigarettes    Quit date: 01/19/1996  . Smokeless tobacco: Never Used  . Alcohol Use: No    Family History  Problem Relation Age of Onset  . Anesthesia problems Neg Hx   . Hypotension Neg Hx   . Malignant hyperthermia Neg Hx   . Pseudochol deficiency Neg Hx   . Dementia Neg Hx   . Alcohol abuse Neg Hx   . Drug abuse Neg Hx   . Depression Neg Hx   . Paranoid behavior Neg Hx   . Schizophrenia Neg  Hx   . Seizures Neg Hx   . Sexual abuse Neg Hx   . Physical abuse Neg Hx   . Anxiety disorder Mother   . Anxiety disorder Sister   . Bipolar disorder Daughter   . OCD Daughter   . ADD / ADHD Daughter    Prior to Admission medications   Medication Sig Start Date End Date Taking? Authorizing Provider  ALPRAZolam Prudy Feeler) 1 MG tablet Take 1 mg by mouth 2 (two) times daily.   Yes Historical Provider, MD  Cyanocobalamin (VITAMIN B-12 IJ) Inject 1 Applicatorful as directed every 30 (thirty) days.    Yes Historical Provider, MD  esomeprazole (NEXIUM) 40 MG capsule Take 40 mg by mouth 2 (two) times daily.    Yes Historical Provider, MD  gabapentin (NEURONTIN) 100 MG capsule Take 100 mg by mouth 3 (three) times daily. For getting off  Xanax, may take up to TWO  4 times a day. 12/03/11 12/22/12 Yes Mike Craze, MD  HYDROcodone-acetaminophen (NORCO) 10-325 MG per tablet Take 1 tablet by mouth every 6 (six) hours as needed for moderate pain.  02/01/12  Yes Historical Provider, MD  hydrOXYzine (ATARAX/VISTARIL) 25 MG tablet Take 1-2 tablets (25-50 mg total) by mouth 3 (three) times daily as needed for anxiety. 06/15/12  Yes Mike Craze, MD  levothyroxine (SYNTHROID, LEVOTHROID) 75 MCG tablet Take 75 mcg by mouth daily before breakfast.   Yes Historical Provider, MD  metoCLOPramide (REGLAN) 10 MG tablet Take 1 tablet (10 mg total) by mouth every 6 (six) hours as needed (nausea/headache). 05/09/12  Yes [name], MD  Multiple Vitamin (MULTIVITAMIN) tablet Take 1 tablet by mouth daily.     Yes Historical Provider, MD  propranolol (INDERAL) 10 MG tablet Take 1 tablet (10 mg total) by mouth 3 (three) times daily. 05/09/12  Yes Mike Craze, MD  venlafaxine (EFFEXOR) 37.5 MG tablet Take 1 tablet (37.5 mg total) by mouth 2 (two) times daily. 05/09/12 05/09/13 Yes Mike Craze, MD  Vitamin D, Ergocalciferol, (DRISDOL) 50000 UNITS CAPS Take 50,000 Units by mouth every 7 (seven) days.   Yes Historical Provider, MD  furosemide (LASIX) 40 MG tablet Take 40 mg by mouth daily as needed (for fluid retention).     Historical Provider, MD  mupirocin ointment (BACTROBAN) 2 % Place 1 application into the nose daily as needed (for scratches).    Historical Provider, MD     Review of Systems  Positive ROS: As above  All other systems have been reviewed and were otherwise negative with the exception of those mentioned in the HPI and as above.  Objective: Vital signs in last 24 hours: Temp:  [97.6 F (36.4 C)] 97.6 F (36.4 C) (12/15 0602) Pulse Rate:  [56] 56 (12/15 0602) Resp:  [20] 20 (12/15 0602) BP: (147)/(69) 147/69 mmHg (12/15 0602) SpO2:  [98 %] 98 % (12/15 0602)  General Appearance: Alert, cooperative, no distress, appears  stated age Head: Normocephalic, without obvious abnormality, atraumatic Eyes: PERRL, conjunctiva/corneas clear, EOM's intact, fundi benign, both eyes      Ears: Normal TM's and external ear canals, both ears Throat: Lips, mucosa, and tongue normal; teeth and gums normal Neck: Supple, symmetrical, trachea midline, no adenopathy; thyroid: No enlargement/tenderness/nodules; no carotid bruit or JVD. Her cervical incision is well-healed. Back: Symmetric, no curvature, ROM normal, no CVA tenderness Lungs: Clear to auscultation bilaterally, respirations unlabored Heart: Regular rate and rhythm, S1 and S2 normal, no murmur, rub or gallop Abdomen: Soft, non-tender,  bowel sounds active all four quadrants, no masses, no organomegaly Extremities: Extremities normal, atraumatic, no cyanosis or edema Pulses: 2+ and symmetric all extremities Skin: Skin color, texture, turgor normal, no rashes or lesions  NEUROLOGIC:   Mental status: alert and oriented, no aphasia, good attention span, Fund of knowledge/ memory ok Motor Exam - grossly normal Sensory Exam - grossly normal Reflexes: Symmetric Coordination - grossly normal Gait - grossly normal Balance - grossly normal Cranial Nerves: I: smell Not tested  II: visual acuity  OS: Normal    OD: Normal   II: visual fields Full to confrontation  II: pupils Equal, round, reactive to light  III,VII: ptosis None  III,IV,VI: extraocular muscles  Full ROM  V: mastication Normal  V: facial light touch sensation  Normal  V,VII: corneal reflex  Present  VII: facial muscle function - upper  Normal  VII: facial muscle function - lower Normal  VIII: hearing Not tested  IX: soft palate elevation  Normal  IX,X: gag reflex Present  XI: trapezius strength  5/5  XI: sternocleidomastoid strength 5/5  XI: neck flexion strength  5/5  XII: tongue strength  Normal    Data Review Lab Results  Component Value Date   WBC 7.4 12/25/2012   HGB 12.8 12/25/2012   HCT  39.8 12/25/2012   MCV 86.5 12/25/2012   PLT 229 12/25/2012   Lab Results  Component Value Date   NA 138 12/25/2012   K 4.5 12/25/2012   CL 102 12/25/2012   CO2 26 12/25/2012   BUN 17 12/25/2012   CREATININE 0.87 12/25/2012   GLUCOSE 115* 12/25/2012   Lab Results  Component Value Date   INR 1.08 08/05/2010    Assessment/Plan: L4-5 spondylolisthesis, spinal stenosis, lumbago, lumbar radiculopathy: I have discussed the situation with the patient. I have reviewed her imaging studies with her and pointed out the abnormalities. We have discussed the various treatment options including surgery. I described the surgical treatment and L4-5 decompression, instrumentation, and fusion. I have shown her surgical models. We have discussed the risks, benefits, alternatives, and likelihood of achieving our goals with surgery. I have answered all the patient's questions. She has decided to proceed with surgery.   [name] D 01/01/2013 7:18 AM

## 2013-01-01 NOTE — Progress Notes (Signed)
Patient ID: [name], female   DOB: 08/02/1945, 67 y.o.   MRN: [id number] Subjective:  The patient is somnolent but easily arousable. She is in no apparent distress. She looks well.  Objective: Vital signs in last 24 hours: Temp:  [97.6 F (36.4 C)] 97.6 F (36.4 C) (12/15 0602) Pulse Rate:  [56] 56 (12/15 0602) Resp:  [20] 20 (12/15 0602) BP: (147)/(69) 147/69 mmHg (12/15 0602) SpO2:  [98 %] 98 % (12/15 0602)  Intake/Output from previous day:   Intake/Output this shift: Total I/O In: 2700 [I.V.:2700] Out: 550 [Urine:550]  Physical exam the patient is somnolent but arousable. She is moving her lower extremities well.  Lab Results: No results found for this basename: WBC, HGB, HCT, PLT,  in the last 72 hours BMET No results found for this basename: NA, K, CL, CO2, GLUCOSE, BUN, CREATININE, CALCIUM,  in the last 72 hours  Studies/Results: Dg Lumbar Spine 1 View  01/01/2013   CLINICAL DATA:  L4-5 laminectomy  EXAM: LUMBAR SPINE - 1 VIEW  COMPARISON:  05/24/2012  FINDINGS: A surgical instrument projects over the L3 inferior articular facet. Degenerative changes at L4-5 and L5-S1 are stable.  IMPRESSION: Intraoperative marking at L3-4.   Electronically Signed   By: Maryclare Bean M.D.   On: 01/01/2013 08:54    Assessment/Plan: The patient is doing well.  LOS: 0 days     [name] D 01/01/2013, 11:19 AM

## 2013-01-01 NOTE — Progress Notes (Signed)
Pt sleeping comfortably after pca button  And . 5mg  dilaudid iv given previosly rated pt as 7 but now sleeping

## 2013-01-01 NOTE — Progress Notes (Signed)
Utilization review completed.  

## 2013-01-01 NOTE — Op Note (Signed)
Brief history: The patient is a 67 year old white female who has complained of chronic back, buttock, and leg pain consistent with neurogenic claudication. She has failed medical management and was worked up with a lumbar x-rays and a lumbar MRI. This demonstrated a spondylolisthesis at L4-5 with spinal stenosis. I discussed the various treatment options with the patient including surgery. She has weighed the risks, benefits, and alternatives surgery and decided proceed with an L4-5 decompression, instrumentation and, and fusion.  Preoperative diagnosis: L4-5 spondylolisthesis, Degenerative disc disease, spinal stenosis compressing both the L4 and the L5 nerve roots; lumbago; lumbar radiculopathy  Postoperative diagnosis: The same  Procedure: Bilateral L4 Laminotomy/foraminotomies to decompress the bilateral L4 and L5 nerve roots(the work required to do this was in addition to the work required to do the posterior lumbar interbody fusion because of the patient's spinal stenosis, facet arthropathy. Etc. requiring a wide decompression of the nerve roots.); L4-5 posterior lumbar interbody fusion with local morselized autograft bone and Actifusebone graft extender; insertion of interbody prosthesis at L4-5 (globus peek interbody prosthesis); posterior nonsegmental instrumentation from L4 to L5 with globus titanium pedicle screws and rods; posterior lateral arthrodesis at L4-5 with local morselized autograft bone and Vitoss bone graft extender.  Surgeon: Dr. Delma Officer  Asst.: Dr. Jillyn Hidden cram  Anesthesia: Gen. endotracheal  Estimated blood loss: 200 cc  Drains: One medium Hemovac  Complications: None  Description of procedure: The patient was brought to the operating room by the anesthesia team. General endotracheal anesthesia was induced. The patient was turned to the prone position on the Wilson frame. The patient's lumbosacral region was then prepared with Betadine scrub and Betadine solution.  Sterile drapes were applied.  I then injected the area to be incised with Marcaine with epinephrine solution. I then used the scalpel to make a linear midline incision over the L4-5 interspace. I then used electrocautery to perform a bilateral subperiosteal dissection exposing the spinous process and lamina of L3, L4 and L5. We then obtained intraoperative radiograph to confirm our location. We then inserted the Verstrac retractor to provide exposure.  I began the decompression by using the high speed drill to perform laminotomies at L4 bilaterally. We then used the Kerrison punches to widen the laminotomy and removed the ligamentum flavum at L4-5 bilaterally and removed the cephalad aspect of the L5 lamina. We used the Kerrison punches to remove the medial facets at L4-5. We performed wide foraminotomies about the bilateral L4 and L5 nerve roots completing the decompression.  We now turned our attention to the posterior lumbar interbody fusion. I used a scalpel to incise the intervertebral disc at L4-5. I then performed a partial intervertebral discectomy at L4-5 using the pituitary forceps. We prepared the vertebral endplates at L4-5 for the fusion by removing the soft tissues with the curettes. We then used the trial spacers to pick the appropriate sized interbody prosthesis. We prefilled his prosthesis with a combination of local morselized autograft bone that we obtained during the decompression as well as Actifuse bone graft extender. We inserted the prefilled prosthesis into the interspace at L4-5. There was a good snug fit of the prosthesis in the interspace. We then filled and the remainder of the intervertebral disc space with local morselized autograft bone and Actifuse. This completed the posterior lumbar interbody arthrodesis.  We now turned attention to the instrumentation. Under fluoroscopic guidance we cannulated the bilateral L4 and L5 pedicles with the bone probe. We then removed the bone  probe. We then tapped  the pedicle with a 6.5 millimeter tap. We then removed the tap. We probed inside the tapped pedicle with a ball probe to rule out cortical breaches. We then inserted a 7.5 x 50 millimeter pedicle screw into the L4 and L5 pedicles bilaterally under fluoroscopic guidance. We then palpated along the medial aspect of the pedicles to rule out cortical breaches. There were none. The nerve roots were not injured. We then connected the unilateral pedicle screws with a lordotic rod. We compressed the construct and secured the rod in place with the caps. We then tightened the caps appropriately. This completed the instrumentation from L4-5.  We now turned our attention to the posterior lateral arthrodesis at L4-5. We used the high-speed drill to decorticate the remainder of the facets, pars, transverse process at L4-5. We then applied a combination of local morselized autograft bone and Vitoss bone graft extender over these decorticated posterior lateral structures. This completed the posterior lateral arthrodesis.  We then obtained hemostasis using bipolar electrocautery. We irrigated the wound out with bacitracin solution. We inspected the thecal sac and nerve roots and noted they were well decompressed. We then removed the retractor. We placed a medium Hemovac drain in the epidural space and tunneled out through separate stab wound. We reapproximated patient's thoracolumbar fascia with interrupted #1 Vicryl suture. We reapproximated patient's subcutaneous tissue with interrupted 2-0 Vicryl suture. The reapproximated patient's skin with Steri-Strips and benzoin. The wound was then coated with bacitracin ointment. A sterile dressing was applied. The drapes were removed. The patient was subsequently returned to the supine position where they were extubated by the anesthesia team. He was then transported to the post anesthesia care unit in stable condition. All sponge instrument and needle counts were  reportedly correct at the end of this case.

## 2013-01-01 NOTE — Progress Notes (Signed)
Orthopedic Tech Progress Note Patient Details:  [name] Jan 10, 1946 [id number]  Patient ID: [name], female   DOB: 08-11-1945, 67 y.o.   MRN: [id number] Pt states that she already has aspen lumbar fusion brace which is in her daughter's car. Pt will get it tomorrow; rn and bio-tech notified  Nikki Dom 01/01/2013, 4:53 PM

## 2013-01-01 NOTE — Preoperative (Signed)
Beta Blockers   Reason not to administer Beta Blockers:Not Applicable, Pt took Inderal at 0300 today

## 2013-01-01 NOTE — Anesthesia Procedure Notes (Signed)
Procedure Name: Intubation Date/Time: 01/01/2013 7:44 AM Performed by: Rogelia Boga Pre-anesthesia Checklist: Patient identified, Emergency Drugs available, Suction available, Patient being monitored and Timeout performed Patient Re-evaluated:Patient Re-evaluated prior to inductionOxygen Delivery Method: Circle system utilized Preoxygenation: Pre-oxygenation with 100% oxygen Intubation Type: IV induction, Rapid sequence and Cricoid Pressure applied Laryngoscope Size: Mac and 4 Grade View: Grade II Tube type: Oral Tube size: 7.5 mm Number of attempts: 1 Airway Equipment and Method: Stylet Secured at: 21 cm Tube secured with: Tape Dental Injury: Teeth and Oropharynx as per pre-operative assessment

## 2013-01-01 NOTE — Transfer of Care (Signed)
Immediate Anesthesia Transfer of Care Note  Patient: [name]  Procedure(s) Performed: Procedure(s) with comments: Lumbar Four-Five Laminectomy with posterior lumbar interbody fusion with interbody prosthesis posterior lateral arthrodesis and posterior nonsegmental instrumentation (N/A) - Lumbar Four-Five Laminectomy with posterior lumbar interbody fusion with interbody prosthesis posterior lateral arthrodesis and posterior nonsegmental instrumentation  Patient Location: PACU  Anesthesia Type:General  Level of Consciousness: awake, alert , oriented and patient cooperative  Airway & Oxygen Therapy: Patient Spontanous Breathing and Patient connected to nasal cannula oxygen  Post-op Assessment: Report given to PACU RN, Post -op Vital signs reviewed and stable and Patient moving all extremities X 4  Post vital signs: Reviewed and stable  Complications: No apparent anesthesia complications

## 2013-01-01 NOTE — Anesthesia Postprocedure Evaluation (Signed)
  Anesthesia Post-op Note  Patient: [name]  Procedure(s) Performed: Procedure(s) with comments: Lumbar Four-Five Laminectomy with posterior lumbar interbody fusion with interbody prosthesis posterior lateral arthrodesis and posterior nonsegmental instrumentation (N/A) - Lumbar Four-Five Laminectomy with posterior lumbar interbody fusion with interbody prosthesis posterior lateral arthrodesis and posterior nonsegmental instrumentation  Patient Location: PACU  Anesthesia Type:General  Level of Consciousness: awake, alert , oriented and patient cooperative  Airway and Oxygen Therapy: Patient Spontanous Breathing and Patient connected to nasal cannula oxygen  Post-op Pain: mild  Post-op Assessment: Post-op Vital signs reviewed, Patient's Cardiovascular Status Stable, Respiratory Function Stable, Patent Airway, No signs of Nausea or vomiting and Pain level controlled  Post-op Vital Signs: Reviewed and stable  Complications: No apparent anesthesia complications

## 2013-01-01 NOTE — Anesthesia Preprocedure Evaluation (Addendum)
Anesthesia Evaluation  Patient identified by MRN, date of birth, ID band Patient awake    Reviewed: Allergy & Precautions, H&P , NPO status , Patient's Chart, lab work & pertinent test results  History of Anesthesia Complications (+) PONV and history of anesthetic complications  Airway Mallampati: II TM Distance: >3 FB Neck ROM: Full    Dental  (+) Teeth Intact and Dental Advisory Given   Pulmonary sleep apnea (no longer needs CPAP s/p gastric bypass) , former smoker,  breath sounds clear to auscultation  Pulmonary exam normal       Cardiovascular hypertension, Pt. on medications and Pt. on home beta blockers Rhythm:Regular Rate:Normal     Neuro/Psych  Headaches, Anxiety Chronic back pain    GI/Hepatic Neg liver ROS, hiatal hernia, PUD, GERD-  Medicated and Poorly Controlled,  Endo/Other  Hypothyroidism Morbid obesity  Renal/GU hydronephrosis     Musculoskeletal   Abdominal (+) + obese,   Peds  Hematology   Anesthesia Other Findings   Reproductive/Obstetrics                          Anesthesia Physical Anesthesia Plan  ASA: III  Anesthesia Plan: General   Post-op Pain Management:    Induction: Intravenous  Airway Management Planned: Oral ETT  Additional Equipment:   Intra-op Plan:   Post-operative Plan: Extubation in OR  Informed Consent: I have reviewed the patients History and Physical, chart, labs and discussed the procedure including the risks, benefits and alternatives for the proposed anesthesia with the patient or authorized representative who has indicated his/her understanding and acceptance.   Dental advisory given  Plan Discussed with: CRNA, Surgeon and Anesthesiologist  Anesthesia Plan Comments: (Plan routine monitors, GETA)       Anesthesia Quick Evaluation

## 2013-01-02 LAB — BASIC METABOLIC PANEL
BUN: 13 mg/dL (ref 6–23)
Chloride: 101 mEq/L (ref 96–112)
Creatinine, Ser: 0.81 mg/dL (ref 0.50–1.10)
GFR calc Af Amer: 86 mL/min — ABNORMAL LOW (ref >90)
GFR calc non Af Amer: 74 mL/min — ABNORMAL LOW (ref >90)
Potassium: 4.4 mEq/L (ref 3.5–5.1)
Sodium: 136 mEq/L (ref 135–145)

## 2013-01-02 LAB — CBC
HCT: 33.8 % — ABNORMAL LOW (ref 36.0–46.0)
Platelets: 177 10*3/uL (ref 150–400)
RDW: 16.1 % — ABNORMAL HIGH (ref 11.5–15.5)
WBC: 7.3 10*3/uL (ref 4.0–10.5)

## 2013-01-02 MED ORDER — MORPHINE SULFATE 2 MG/ML IJ SOLN: 2.0000 mg | INTRAMUSCULAR | Status: DC | PRN

## 2013-01-02 MED ORDER — PRO-STAT SUGAR FREE PO LIQD
30.0000 mL | Freq: Two times a day (BID) | ORAL | Status: DC
  Administered 2013-01-03: 30 mL via ORAL
  Filled 2013-01-02 (×9): qty 30

## 2013-01-02 MED FILL — Heparin Sodium (Porcine) Inj 1000 Unit/ML: INTRAMUSCULAR | Qty: 30 | Status: AC

## 2013-01-02 MED FILL — Sodium Chloride IV Soln 0.9%: INTRAVENOUS | Qty: 1000 | Status: AC

## 2013-01-02 NOTE — Progress Notes (Signed)
INITIAL NUTRITION ASSESSMENT  DOCUMENTATION CODES Per approved criteria  -Obesity grade 2   INTERVENTION: Prostat 30 ml bid Encouraged intake of protein rich foods  NUTRITION DIAGNOSIS: Inadequate oral intake related to decreased appetite as evidenced by patient report and documentation..   Goal: Intake of meals and supplements to meet >/=75% estimated needs.  Monitor:  Intake, labs, weight trend  Reason for Assessment: mst  67 y.o. female  Admitting Dx: <principal problem not specified>  ASSESSMENT: Patient s/p L4-5 laminectomy 12/15.  Patient with decreased appetite since but adequate intake prior to admit.  Patient with reported hx of gastric bypass surgery in 2009.  States that she tried to eat well and focus on protein at home.  Does not want Ensure as she dislikes.  Patient noted to have a 15 lb weight loss in the last 8 months.    Height: Ht Readings from Last 1 Encounters:  01/01/13 5\' 5"  (1.651 m)    Weight: Wt Readings from Last 1 Encounters:  01/01/13 220 lb (99.791 kg)    Ideal Body Weight: 125 lbs  % Ideal Body Weight: 176  Wt Readings from Last 10 Encounters:  01/01/13 220 lb (99.791 kg)  01/01/13 220 lb (99.791 kg)  12/25/12 220 lb (99.791 kg)  05/09/12 243 lb 3.2 oz (110.315 kg)  05/08/12 236 lb (107.049 kg)  05/01/12 230 lb 9.6 oz (104.599 kg)  04/20/12 232 lb (105.235 kg)  12/03/11 241 lb (109.317 kg)  11/04/11 229 lb (103.874 kg)  10/20/11 235 lb (106.595 kg)    Usual Body Weight: 235 lbs  % Usual Body Weight: 94  BMI:  Body mass index is 36.61 kg/(m^2).  Estimated Nutritional Needs: Kcal: 1600-1700 Protein: 105-115 gm Fluid: 2.3L  Skin: incision otherwise intact  Diet Order: General  EDUCATION NEEDS: -No education needs identified at this time   Intake/Output Summary (Last 24 hours) at 01/02/13 1339 Last data filed at 01/02/13 0941  Gross per 24 hour  Intake   1215 ml  Output   1450 ml  Net   -235 ml       Labs:   Recent Labs Lab 01/02/13 0418  NA 136  K 4.4  CL 101  CO2 25  BUN 13  CREATININE 0.81  CALCIUM 8.3*  GLUCOSE 126*    CBG (last 3)  No results found for this basename: GLUCAP,  in the last 72 hours  Scheduled Meds: . docusate sodium  100 mg Oral BID  . gabapentin  100 mg Oral TID  . levothyroxine  75 mcg Oral QAC breakfast  . multivitamin with minerals  1 tablet Oral Daily  . pantoprazole  80 mg Oral Q1200  . propranolol  10 mg Oral TID  . venlafaxine  37.5 mg Oral BID  . Vitamin D (Ergocalciferol)  50,000 Units Oral Q7 days    Continuous Infusions: . lactated ringers 75 mL/hr at 01/02/13 0103    Past Medical History  Diagnosis Date  . Anxiety   . Thyroid disease   . Morbid obesity   . PONV (postoperative nausea and vomiting)   . Recurrent upper respiratory infection (URI)   . GERD (gastroesophageal reflux disease)     h/o bleeding ulcer, hosp., [name]  . Blood transfusion     /w bleeding ulcer & post knee replacement- 2012  . Neuralgia     spondylosis & stenosis  . Hiatal hernia   . Depression   . Hypothyroidism   . Chronic kidney disease  h/o renal calculi, h/o lithotripsy  . Obsessive-compulsive disorder   . Sleep ap[facility]     [facility], CP[facility] not used in 3 months, states she has a new machine  but hasn't used   . Shortness of breath     with exertion  . Headache(784.0)   . Arthritis     Past Surgical History  Procedure Laterality Date  . Gastric bypass    . Cholecystectomy    . Abdominal hysterectomy    . Appendectomy    . Eye surgery      bilateral cataracts removed, w/IOL  . Tonsillectomy      as a child  . Parathyroidectomy    . Cesarean section    . Peripherally inserted central catheter insertion      PICC line for treatment of MRSA  . Anterior cervical decomp/discectomy fusion  12/09/2010    Procedure: ANTERIOR CERVICAL DECOMPRESSION/DISCECTOMY FUSION 3 LEVELS;  Surgeon: Cristi Loron;  Location:  [facility];  Service: Neurosurgery;  Laterality: N/A;  Cervical three-four,Cervical four-five,Cervical Five-Six,Cervical Six-Seven ANTERIOR CERVICAL DECOMPRESSION WITH FUSION INTERBODY PROTHESIS PLATING AND BONEGRAFT  . Joint replacement      07/2010 &  2002- respectively- both knees     Oran Rein, RD, LDN Clinical Inpatient Dietitian Pager:  [phone] Weekend and after hours pager:  [phone]

## 2013-01-02 NOTE — Progress Notes (Signed)
Brace finally brought by family members, patient OOB to chair with brace, walker. Foley removed, Call bell in reach. Physical and occupational therapy in room with patient now. Will cont to monitor.

## 2013-01-02 NOTE — Evaluation (Signed)
Physical Therapy Evaluation Patient Details Name: [name] MRN: [id number] DOB: Dec 18, 1945 Today's Date: 01/02/2013 Time: 4098-1191 PT Time Calculation (min): 22 min  PT Assessment / Plan / Recommendation History of Present Illness  67 yo female s/p PLIF L4-5  Clinical Impression  Pt needs max encouragement for participation.  Pt may need to consider RW for home use as she has a 4WW at home.  Will continue to follow.      PT Assessment  Patient needs continued PT services    Follow Up Recommendations  Home health PT;Supervision/Assistance - 24 hour    Does the patient have the potential to tolerate intense rehabilitation      Barriers to Discharge        Equipment Recommendations  Rolling walker with 5" wheels    Recommendations for Other Services     Frequency Min 5X/week    Precautions / Restrictions Precautions Precautions: Back Precaution Booklet Issued: Yes (comment) Precaution Comments: back handout provided adn reviewed Required Braces or Orthoses: Spinal Brace Spinal Brace: Lumbar corset;Applied in sitting position Restrictions Weight Bearing Restrictions: No   Pertinent Vitals/Pain 9/10 pt premedicated.        Mobility  Bed Mobility Bed Mobility: Not assessed Transfers Transfers: Sit to Stand;Stand to Sit Sit to Stand: 4: Min assist;With upper extremity assist;From chair/3-in-1 Stand to Sit: 4: Min assist;With upper extremity assist;To chair/3-in-1 Details for Transfer Assistance: cues for hand placement and scooting toward the edge of chair  Ambulation/Gait Ambulation/Gait Assistance: 4: Min guard Ambulation Distance (Feet): 30 Feet Assistive device: Rolling walker Ambulation/Gait Assistance Details: cues for use of RW and max encouragement for ambulation.   Gait Pattern: Step-through pattern;Decreased stride length Stairs: No Wheelchair Mobility Wheelchair Mobility: No    Exercises     PT Diagnosis: Difficulty walking;Acute pain  PT  Problem List: Decreased activity tolerance;Decreased balance;Decreased mobility;Decreased knowledge of use of DME;Decreased knowledge of precautions;Pain PT Treatment Interventions: DME instruction;Gait training;Stair training;Functional mobility training;Therapeutic activities;Therapeutic exercise;Balance training;Patient/family education     PT Goals(Current goals can be found in the care plan section) Acute Rehab PT Goals Patient Stated Goal: to lay back down PT Goal Formulation: With patient Time For Goal Achievement: 01/09/13 Potential to Achieve Goals: Good  Visit Information  Last PT Received On: 01/02/13 Assistance Needed: +1 PT/OT/SLP Co-Evaluation/Treatment: Yes Reason for Co-Treatment: For patient/therapist safety PT goals addressed during session: Mobility/safety with mobility;Balance;Proper use of DME OT goals addressed during session: ADL's and self-care History of Present Illness: 67 yo female s/p PLIF L4-5       Prior Functioning  Home Living Family/patient expects to be discharged to:: Private residence Living Arrangements: Spouse/significant other;Children (6 people in house) Available Help at Discharge: Family Type of Home: House Home Access: Stairs to enter Secretary/administrator of Steps: 1 Home Layout: One level Home Equipment: Environmental consultant - 4 wheels;Tub bench;Hand held shower head Prior Function Level of Independence: Independent Communication Communication: No difficulties Dominant Hand: Right    Cognition  Cognition Arousal/Alertness: Awake/alert Behavior During Therapy: WFL for tasks assessed/performed Overall Cognitive Status: Within Functional Limits for tasks assessed    Extremity/Trunk Assessment Upper Extremity Assessment Upper Extremity Assessment: Defer to OT evaluation Lower Extremity Assessment Lower Extremity Assessment: Overall WFL for tasks assessed   Balance Balance Balance Assessed: Yes Static Standing Balance Static Standing -  Balance Support: Left upper extremity supported;During functional activity Static Standing - Level of Assistance: 5: Stand by assistance  End of Session PT - End of Session Equipment Utilized During Treatment:  Gait belt;Back brace Activity Tolerance: Patient tolerated treatment well Patient left: in chair;with call bell/phone within reach;with family/visitor present Nurse Communication: Mobility status  GP     Sunny Schlein, [place] [phone] 01/02/2013, 2:41 PM

## 2013-01-02 NOTE — Evaluation (Signed)
Occupational Therapy Evaluation Patient Details Name: [name] MRN: [id number] DOB: [birth year] Today's Date: 01/02/2013 Time: 1351-1411 OT Time Calculation (min): 20 min  OT Assessment / Plan / Recommendation History of present illness 67 yo female s/p PLIF L4-5   Clinical Impression   Patient is s/p L4-5 PLIF surgery resulting in functional limitations due to the deficits listed below (see OT problem list).  Patient will benefit from skilled OT acutely to increase independence and safety with ADLS to allow discharge HHOT.     OT Assessment  Patient needs continued OT Services    Follow Up Recommendations  Home health OT;Supervision - Intermittent    Barriers to Discharge      Equipment Recommendations  None recommended by OT    Recommendations for Other Services    Frequency  Min 2X/week    Precautions / Restrictions Precautions Precautions: Back Precaution Comments: back handout provided adn reviewed   Pertinent Vitals/Pain 9 out 10 pain No more medication allowed at this time per RN Morrie Sheldon    ADL  Eating/Feeding: Modified independent Where Assessed - Eating/Feeding: Chair Grooming: Min guard;Teeth care Where Assessed - Grooming: Unsupported standing Toilet Transfer: Minimal assistance Toilet Transfer Method: Sit to stand Toilet Transfer Equipment: Raised toilet seat with arms (or 3-in-1 over toilet) Equipment Used: Gait belt;Back brace;Rolling walker Transfers/Ambulation Related to ADLs: Pt ambulating with min guard (A) ADL Comments:  pt encouraged to participate with therapy. Pt educated on back precautions and able to recall 2 out 3 ( forgot arching) at end of session. Pt educated on brace positioning. pt encouraged all meals in chair and sitting up for 45 minutes. Pt has had prolonged bedrest due to brace not arriving until after 13:00 today.    OT Diagnosis: Generalized weakness;Acute pain  OT Problem List: Decreased strength;Decreased activity  tolerance;Impaired balance (sitting and/or standing);Decreased safety awareness;Decreased knowledge of use of DME or AE;Decreased knowledge of precautions;Obesity;Pain OT Treatment Interventions: Self-care/ADL training;Therapeutic exercise;DME and/or AE instruction;Therapeutic activities;Patient/family education;Balance training   OT Goals(Current goals can be found in the care plan section) Acute Rehab OT Goals Patient Stated Goal: to lay back down OT Goal Formulation: With patient Time For Goal Achievement: 01/16/13 Potential to Achieve Goals: Good  Visit Information  Last OT Received On: 01/02/13 Assistance Needed: +1 PT/OT/SLP Co-Evaluation/Treatment: Yes Reason for Co-Treatment: For patient/therapist safety OT goals addressed during session: ADL's and self-care History of Present Illness: 66 yo female s/p PLIF L4-5       Prior Functioning     Home Living Family/patient expects to be discharged to:: Private residence Living Arrangements: Spouse/significant other;Children (6 people in house) Available Help at Discharge: Family Type of Home: House Home Access: Stairs to enter Secretary/administrator of Steps: 1 Home Layout: One level Home Equipment: Environmental consultant - 4 wheels;Tub bench;Hand held shower head Prior Function Level of Independence: Independent Communication Communication: No difficulties Dominant Hand: Right         Vision/Perception Vision - History Baseline Vision: Wears glasses only for reading Visual History: Corrective eye surgery (for cataracts) Patient Visual Report: No change from baseline   Cognition  Cognition Arousal/Alertness: Awake/alert Behavior During Therapy: WFL for tasks assessed/performed Overall Cognitive Status: Within Functional Limits for tasks assessed    Extremity/Trunk Assessment Upper Extremity Assessment Upper Extremity Assessment: Overall WFL for tasks assessed Lower Extremity Assessment Lower Extremity Assessment: Defer to PT  evaluation     Mobility Bed Mobility Bed Mobility: Not assessed Transfers Transfers: Sit to Stand;Stand to Sit Sit to Stand: 4:  Min assist;With upper extremity assist;From chair/3-in-1 Stand to Sit: 4: Min assist;With upper extremity assist;To chair/3-in-1 Details for Transfer Assistance: cues for hand placement and scooting toward the edge of chair      Exercise     Balance Balance Balance Assessed: Yes Static Standing Balance Static Standing - Balance Support: Left upper extremity supported;During functional activity Static Standing - Level of Assistance: 5: Stand by assistance   End of Session OT - End of Session Activity Tolerance: Patient tolerated treatment well Patient left: in chair;with call bell/phone within reach;with family/visitor present Nurse Communication: Mobility status;Precautions  GO     Harolyn Rutherford 01/02/2013, 2:14 PM Pager: [phone]

## 2013-01-02 NOTE — Progress Notes (Signed)
Patient has voided post foley removal 

## 2013-01-02 NOTE — Progress Notes (Signed)
Patient ID: [name], female   DOB: April 18, 1945, 67 y.o.   MRN: [id number] Subjective:  The patient is alert and pleasant. She looks well. She is in no apparent distress.  Objective: Vital signs in last 24 hours: Temp:  [96.8 F (36 C)-99.8 F (37.7 C)] 99.8 F (37.7 C) (12/16 0616) Pulse Rate:  [55-78] 77 (12/16 0616) Resp:  [11-18] 16 (12/16 0616) BP: (88-142)/(50-73) 119/57 mmHg (12/16 0616) SpO2:  [91 %-99 %] 91 % (12/16 0616) Weight:  [99.791 kg (220 lb)] 99.791 kg (220 lb) (12/15 1414)  Intake/Output from previous day: 12/15 0701 - 12/16 0700 In: 3795 [P.O.:120; I.V.:3525] Out: 1710 [Urine:1600; Drains:10; Blood:100] Intake/Output this shift:    Physical exam the patient is alert and oriented. She is moving her lower extremities well.  Lab Results:  Recent Labs  01/02/13 0418  WBC 7.3  HGB 10.6*  HCT 33.8*  PLT 177   BMET  Recent Labs  01/02/13 0418  NA 136  K 4.4  CL 101  CO2 25  GLUCOSE 126*  BUN 13  CREATININE 0.81  CALCIUM 8.3*    Studies/Results: Dg Lumbar Spine 2-3 Views  01/01/2013   CLINICAL DATA:  Lateral portable lumbar spine view during lumbar fusion surgery.  EXAM: LUMBAR SPINE - 2-3 VIEW  COMPARISON:  01/01/2013 at 8:27 a.m.  FINDINGS: Pedicle screws have now been inserted at L4 and L5. A radiolucent disc spacer maintains disc height, located in the posterior central aspect of the disc space. The orthopedic hardware is well-seated and aligned. No evidence of an operative complication.  IMPRESSION: Lateral operative lumbar spine image as described.   Electronically Signed   By: Amie Portland M.[name].   On: 01/01/2013 11:26   Dg Lumbar Spine 1 View  01/01/2013   CLINICAL DATA:  L4-5 laminectomy  EXAM: LUMBAR SPINE - 1 VIEW  COMPARISON:  05/24/2012  FINDINGS: A surgical instrument projects over the L3 inferior articular facet. Degenerative changes at L4-5 and L5-S1 are stable.  IMPRESSION: Intraoperative marking at L3-4.   Electronically Signed    By: Maryclare Bean M.[name].   On: 01/01/2013 08:54    Assessment/Plan: Postop day 1: The patient is doing well. We will mobilize with PT and OT. I'll discontinue the PCA pump.  LOS: 1 day     [name] 01/02/2013, 7:52 AM

## 2013-01-02 NOTE — Progress Notes (Signed)
Called patient's daughter Marchelle Folks who said she would be bringing the patient's back brace "in a couple hours." told patient that as soon as brace is here, we will mobilize and remove foley and hemovac. Will cont to monitor

## 2013-01-02 NOTE — Progress Notes (Signed)
hemovac removed

## 2013-01-03 MED ORDER — HYDROMORPHONE HCL 2 MG PO TABS
4.0000 mg | ORAL_TABLET | ORAL | Status: DC | PRN
  Administered 2013-01-03 – 2013-01-05 (×8): 4 mg via ORAL
  Filled 2013-01-03 (×8): qty 2

## 2013-01-03 NOTE — Progress Notes (Signed)
Occupational Therapy Treatment Patient Details Name: [name] MRN: [id number] DOB: 29-Jan-1945 Today's Date: 01/03/2013 Time: 4098-1191 OT Time Calculation (min): 21 min  OT Assessment / Plan / Recommendation  History of present illness 67 yo female s/p PLIF L4-5   OT comments  Pt progressing slowly with OT at this time. Pt needed encouragement for oral care. Pt only wanted to complete bed to chair transfer initially. Pt did brush teeth and toilet transfer. Pt requesting No PT but educated on the need for PT at this time. Pt very reluctant to participate in therapy and with encouragement will complete therapy session.   Follow Up Recommendations  Supervision - Intermittent    Barriers to Discharge       Equipment Recommendations  None recommended by OT    Recommendations for Other Services    Frequency Min 2X/week   Progress towards OT Goals Progress towards OT goals: Progressing toward goals  Plan Discharge plan needs to be updated    Precautions / Restrictions Precautions Precautions: Back Precaution Comments: able to recall 2 out 3 precautions Required Braces or Orthoses: Spinal Brace Spinal Brace: Lumbar corset;Applied in sitting position   Pertinent Vitals/Pain Reports pain now reduced. Pt premedicated pain now below 10 . Pt reports less than 5    ADL  Eating/Feeding:  (reports no NPO intake for breakfast) Grooming: Teeth care;Min guard Where Assessed - Grooming: Unsupported standing (cues to avoid bending at sink level ) Toilet Transfer: Min Pension scheme manager Method: Sit to Barista: Regular height toilet;Grab bars Toileting - Architect and Hygiene: Min guard Where Assessed - Engineer, mining and Hygiene: Sit to stand from 3-in-1 or toilet Equipment Used: Gait belt;Back brace;Rolling walker Transfers/Ambulation Related to ADLs: pt ambulating to bathroom with decr gait velocity ADL Comments: Pt supine in bed  and reluctant to participate. pt needed encouragement. Pt educated on the healing process. Pt encouraged to engage in therapy to help practice maintaining precautions. Pt expressed not wanting PT today and to walk with daughter only. Pt educated that she needs to walk x3 today, OOB for all meals even if she is not eating the meal and need to be cleared by PTA for ambulation. Pt and daughter educated on need to have check off complete for ambulation independently. Pt able to don brace mod I this session. pt needs encouragemetn for OOB and avoid supine. Pt educated on the negative impact to healing process with prolonged supine positioning.     OT Diagnosis:    OT Problem List:   OT Treatment Interventions:     OT Goals(current goals can now be found in the care plan section) Acute Rehab OT Goals Patient Stated Goal: to lay back down OT Goal Formulation: With patient Time For Goal Achievement: 01/16/13 Potential to Achieve Goals: Good ADL Goals Pt Will Perform Grooming: with modified independence;standing Pt Will Perform Lower Body Dressing: with supervision;with adaptive equipment;sit to/from stand Pt Will Transfer to Toilet: with supervision;bedside commode;ambulating Pt Will Perform Toileting - Clothing Manipulation and hygiene: with supervision;sit to/from stand Additional ADL Goal #1: Pt will don doff brace MOD I  Visit Information  Last OT Received On: 01/03/13 Assistance Needed: +1 History of Present Illness: 67 yo female s/p PLIF L4-5    Subjective Data      Prior Functioning       Cognition  Cognition Arousal/Alertness: Awake/alert Behavior During Therapy: WFL for tasks assessed/performed Overall Cognitive Status: Within Functional Limits for tasks assessed  Mobility  Bed Mobility Bed Mobility: Supine to Sit;Sitting - Scoot to Edge of Bed Supine to Sit: 4: Min guard;With rails;HOB elevated Sitting - Scoot to Edge of Bed: 4: Min guard Details for Bed Mobility  Assistance: Pt holding bed rail and pulling into sitting. Pt requires use of bed and rails Transfers Transfers: Sit to Stand;Stand to Sit Sit to Stand: 4: Min guard;With upper extremity assist;From bed Stand to Sit: 4: Min guard;With upper extremity assist;To toilet (with rails) Details for Transfer Assistance: Pt requires use of rail for toilet transfer but does not want DME    Exercises      Balance     End of Session OT - End of Session Activity Tolerance: Patient tolerated treatment well Patient left: in chair;with call bell/phone within reach;with family/visitor present Nurse Communication: Mobility status;Precautions  GO     Harolyn Rutherford 01/03/2013, 11:18 AM Pager: [phone]

## 2013-01-03 NOTE — Progress Notes (Signed)
Physical Therapy Treatment Patient Details Name: [name] MRN: [id number] DOB: 06/03/1945 Today's Date: 01/03/2013 Time: 3086-5784 PT Time Calculation (min): 14 min  PT Assessment / Plan / Recommendation  History of Present Illness 67 yo female s/p PLIF L4-5   PT Comments   Encouraged to ambulate with daughter. Continyue with POC  Follow Up Recommendations  Home health PT;Supervision/Assistance - 24 hour     Does the patient have the potential to tolerate intense rehabilitation     Barriers to Discharge        Equipment Recommendations  Rolling walker with 5" wheels    Recommendations for Other Services    Frequency Min 5X/week   Progress towards PT Goals Progress towards PT goals: Progressing toward goals  Plan Current plan remains appropriate    Precautions / Restrictions Precautions Precautions: Back Required Braces or Orthoses: Spinal Brace Spinal Brace: Lumbar corset;Applied in sitting position   Pertinent Vitals/Pain no apparent distress     Mobility  Transfers Sit to Stand: With upper extremity assist;From chair/3-in-1;4: Min guard Stand to Sit: With upper extremity assist;To chair/3-in-1;4: Min guard Ambulation/Gait Ambulation/Gait Assistance: 4: Min guard Ambulation Distance (Feet): 80 Feet Assistive device: Rolling walker Gait Pattern: Step-through pattern;Decreased stride length    Exercises     PT Diagnosis:    PT Problem List:   PT Treatment Interventions:     PT Goals (current goals can now be found in the care plan section)    Visit Information  Last PT Received On: 01/03/13 Assistance Needed: +1 History of Present Illness: 67 yo female s/p PLIF L4-5    Subjective Data      Cognition  Cognition Arousal/Alertness: Awake/alert Behavior During Therapy: WFL for tasks assessed/performed Overall Cognitive Status: Within Functional Limits for tasks assessed    Balance     End of Session PT - End of Session Equipment Utilized During  Treatment: Gait belt;Back brace Activity Tolerance: Patient tolerated treatment well Patient left: in chair;with call bell/phone within reach;with family/visitor present Nurse Communication: Mobility status   GP     Fredrich Birks 01/03/2013, 10:37 AM  01/03/2013 Fredrich Birks PTA [phone] pager [phone] office

## 2013-01-03 NOTE — Progress Notes (Signed)
Patient ID: [name], female   DOB: 1945/10/31, 67 y.o.   MRN: [id number] Subjective:  The patient is alert and pleasant. She looks well. She complains he is not getting adequate pain relief with Percocet.  Objective: Vital signs in last 24 hours: Temp:  [98.3 F (36.8 C)-99.3 F (37.4 C)] 99.3 F (37.4 C) (12/17 1000) Pulse Rate:  [56-92] 56 (12/17 1105) Resp:  [16-18] 18 (12/17 1000) BP: (96-153)/(50-66) 119/56 mmHg (12/17 1105) SpO2:  [91 %-96 %] 93 % (12/17 1000)  Intake/Output from previous day: 12/16 0701 - 12/17 0700 In: 360 [P.O.:360] Out: 500 [Urine:500] Intake/Output this shift: Total I/O In: 120 [P.O.:120] Out: -   Physical exam the patient is alert and oriented. Her strength is normal in her lower extremities.  Lab Results:  Recent Labs  01/02/13 0418  WBC 7.3  HGB 10.6*  HCT 33.8*  PLT 177   BMET  Recent Labs  01/02/13 0418  NA 136  K 4.4  CL 101  CO2 25  GLUCOSE 126*  BUN 13  CREATININE 0.81  CALCIUM 8.3*    Studies/Results: No results found.  Assessment/Plan: Postop day #2: We will try her on Dilaudid. We will continue mobilize her with PT and OT.  LOS: 2 days     Adama Ferber D 01/03/2013, 1:04 PM

## 2013-01-04 NOTE — Progress Notes (Signed)
Patient ID: [name], female   DOB: July 22, 1945, 67 y.o.   MRN: [id number] Subjective:  The patient is alert and pleasant.  Objective: Vital signs in last 24 hours: Temp:  [98 F (36.7 C)-99.7 F (37.6 C)] 98.7 F (37.1 C) (12/18 0955) Pulse Rate:  [73-83] 77 (12/18 0955) Resp:  [18-20] 20 (12/18 0955) BP: (105-135)/(53-77) 118/77 mmHg (12/18 0955) SpO2:  [91 %-96 %] 91 % (12/18 0955)  Intake/Output from previous day: 12/17 0701 - 12/18 0700 In: 600 [P.O.:600] Out: -  Intake/Output this shift:    Physical exam the patient is alert and oriented. Her dressing is clean and dry. Her lower extremity strength is normal.  Lab Results:  Recent Labs  01/02/13 0418  WBC 7.3  HGB 10.6*  HCT 33.8*  PLT 177   BMET  Recent Labs  01/02/13 0418  NA 136  K 4.4  CL 101  CO2 25  GLUCOSE 126*  BUN 13  CREATININE 0.81  CALCIUM 8.3*    Studies/Results: No results found.  Assessment/Plan: Postop day #3: The patient is making good progress. We will tentatively plan to send her home on Saturday per her wishes.  LOS: 3 days     [name] D 01/04/2013, 12:23 PM

## 2013-01-04 NOTE — Progress Notes (Signed)
PT Cancellation Note  Patient Details Name: [name] MRN: [id number] DOB: February 17, 1945   Cancelled Treatment:    Reason Eval/Treat Not Completed: Fatigue/lethargy limiting ability to participate. Patient just back into bed. Has walked twice with daughter.    Fredrich Birks 01/04/2013, 2:44 PM

## 2013-01-05 NOTE — Progress Notes (Signed)
Physical Therapy Treatment Patient Details Name: [name] MRN: [id number] DOB: [birth year] Today's Date: 01/05/2013 Time: 5784-6962 PT Time Calculation (min): 24 min  PT Assessment / Plan / Recommendation  History of Present Illness 67 yo female s/p PLIF L4-5   PT Comments   Patient progressing well with mobility and agreeable to walk with some motivation and encouragement. Has been walking with daughter. Anticipate DC home tomorrow  Follow Up Recommendations  Home health PT;Supervision/Assistance - 24 hour     Does the patient have the potential to tolerate intense rehabilitation     Barriers to Discharge        Equipment Recommendations  Rolling walker with 5" wheels    Recommendations for Other Services    Frequency Min 5X/week   Progress towards PT Goals Progress towards PT goals: Progressing toward goals  Plan Current plan remains appropriate    Precautions / Restrictions Precautions Precautions: Back Precaution Comments: Able to state all precautions this session Required Braces or Orthoses: Spinal Brace Spinal Brace: Lumbar corset;Applied in sitting position   Pertinent Vitals/Pain Sore but no complaints of pain    Mobility  Bed Mobility Bed Mobility: Left Sidelying to Sit Left Sidelying to Sit: 5: Supervision Details for Bed Mobility Assistance: Cues for hand and elbow placement to push up Transfers Sit to Stand: 5: Supervision Stand to Sit: 5: Supervision Details for Transfer Assistance: Cues for hand placement and not to pull up on Rw Ambulation/Gait Ambulation/Gait Assistance: 5: Supervision Ambulation Distance (Feet): 160 Feet Assistive device: Rolling walker Gait Pattern: Step-through pattern;Decreased stride length Gait velocity: decreased    Exercises     PT Diagnosis:    PT Problem List:   PT Treatment Interventions:     PT Goals (current goals can now be found in the care plan section)    Visit Information  Last PT Received On:  01/05/13 Assistance Needed: +1 History of Present Illness: 67 yo female s/p PLIF L4-5    Subjective Data      Cognition  Cognition Arousal/Alertness: Awake/alert Behavior During Therapy: WFL for tasks assessed/performed Overall Cognitive Status: Within Functional Limits for tasks assessed    Balance     End of Session PT - End of Session Equipment Utilized During Treatment: Gait belt;Back brace Activity Tolerance: Patient tolerated treatment well Patient left: in chair;with call bell/phone within reach;with family/visitor present Nurse Communication: Mobility status   GP     Fredrich Birks 01/05/2013, 9:30 AM  01/05/2013 Fredrich Birks PTA [phone] pager [phone] office

## 2013-01-05 NOTE — Progress Notes (Signed)
Patient has no dressing to lower back incision. She states that the MD came yesterday and took it off and wants it to remain open to air. RN will continue to assess incision for any infection. Will continue to monitor

## 2013-01-05 NOTE — Progress Notes (Signed)
Patient ID: [name], female   DOB: January 14, 1946, 67 y.o.   MRN: [id number] Subjective:  The patient is alert and pleasant. She looks well. She wants to go home tomorrow.  Objective: Vital signs in last 24 hours: Temp:  [97.7 F (36.5 C)-101 F (38.3 C)] 97.7 F (36.5 C) (12/19 0533) Pulse Rate:  [47-80] 70 (12/19 0533) Resp:  [16-20] 16 (12/19 0533) BP: (95-121)/(39-77) 121/56 mmHg (12/19 0533) SpO2:  [90 %-98 %] 98 % (12/19 0533)  Intake/Output from previous day: 12/18 0701 - 12/19 0700 In: 240 [P.O.:240] Out: -  Intake/Output this shift:    Physical exam patient is alert and oriented. Her strength is normal in her lower extremities. Her wound is healing well without signs of infection.  Lab Results: No results found for this basename: WBC, HGB, HCT, PLT,  in the last 72 hours BMET No results found for this basename: NA, K, CL, CO2, GLUCOSE, BUN, CREATININE, CALCIUM,  in the last 72 hours  Studies/Results: No results found.  Assessment/Plan: Postop day #4: The patient is doing well. We will plan to send her home tomorrow. I gave her her discharge instructions and answered all her questions.  LOS: 4 days     Keirsten Matuska D 01/05/2013, 7:51 AM

## 2013-01-06 MED ORDER — OXYCODONE-ACETAMINOPHEN 5-325 MG PO TABS: 1.0000 | ORAL_TABLET | ORAL | Status: DC | PRN

## 2013-01-06 MED ORDER — CYCLOBENZAPRINE HCL 10 MG PO TABS: 5.0000 mg | ORAL_TABLET | Freq: Three times a day (TID) | ORAL | Status: DC | PRN

## 2013-01-06 MED ORDER — HYDROCODONE-ACETAMINOPHEN 5-325 MG PO TABS: 1.0000 | ORAL_TABLET | Freq: Four times a day (QID) | ORAL | Status: DC | PRN

## 2013-01-06 NOTE — Discharge Summary (Signed)
Physician Discharge Summary  Patient ID: [name] MRN: [id number] DOB/AGE: December 17, 1945 67 y.o.  Admit date: 01/01/2013 Discharge date: 01/06/2013  Admission Diagnoses:  L4-5 spondylolisthesis, Degenerative disc disease, spinal stenosis compressing both the L4 and the L5 nerve roots; lumbago; lumbar radiculopathy  Discharge Diagnoses:  L4-5 spondylolisthesis, Degenerative disc disease, spinal stenosis compressing both the L4 and the L5 nerve roots; lumbago; lumbar radiculopathy  Active Problems:   Spondylolisthesis of lumbar region  Discharged Condition: good  Hospital Course: Patient was admitted by Dr. Delma Officer who performed a bilateral L4-5 lumbar decompression and arthrodesis. Postoperatively she has made gradual progress, ambulating in the halls. She's been seen by PT and OT. Her wound is healing nicely. She is asking to be discharged to home. She's been given instructions regarding wound care and activities. We've arranged for her to have a rolling walker for use at home. She is to return to see Dr. Lovell Sheehan in about 3 weeks, and she is to call early next week for that appointment.  Discharge Exam: Blood pressure 116/53, pulse 70, temperature 98.6 F (37 C), temperature source Oral, resp. rate 20, height 5\' 5"  (1.651 m), weight 99.791 kg (220 lb), SpO2 97.00%.  Disposition: Home  Discharge Orders   Future Orders Complete By Expires   Walker rolling  As directed    Comments:     5" wheels       Medication List    STOP taking these medications       gabapentin 100 MG capsule  Commonly known as:  NEURONTIN      TAKE these medications       ALPRAZolam 1 MG tablet  Commonly known as:  XANAX  Take 1 mg by mouth 2 (two) times daily.     cyclobenzaprine 10 MG tablet  Commonly known as:  FLEXERIL  Take 0.5-1 tablets (5-10 mg total) by mouth 3 (three) times daily as needed for muscle spasms.     esomeprazole 40 MG capsule  Commonly known as:  NEXIUM  Take 40 mg by  mouth 2 (two) times daily.     furosemide 40 MG tablet  Commonly known as:  LASIX  Take 40 mg by mouth daily as needed (for fluid retention).     HYDROcodone-acetaminophen 10-325 MG per tablet  Commonly known as:  NORCO  Take 1 tablet by mouth every 6 (six) hours as needed for moderate pain.     HYDROcodone-acetaminophen 5-325 MG per tablet  Commonly known as:  NORCO  Take 1 tablet by mouth every 6 (six) hours as needed for moderate pain or severe pain.     hydrOXYzine 25 MG tablet  Commonly known as:  ATARAX/VISTARIL  Take 1-2 tablets (25-50 mg total) by mouth 3 (three) times daily as needed for anxiety.     levothyroxine 75 MCG tablet  Commonly known as:  SYNTHROID, LEVOTHROID  Take 75 mcg by mouth daily before breakfast.     metoCLOPramide 10 MG tablet  Commonly known as:  REGLAN  Take 1 tablet (10 mg total) by mouth every 6 (six) hours as needed (nausea/headache).     multivitamin tablet  Take 1 tablet by mouth daily.     mupirocin ointment 2 %  Commonly known as:  BACTROBAN  Place 1 application into the nose daily as needed (for scratches).     oxyCODONE-acetaminophen 5-325 MG per tablet  Commonly known as:  PERCOCET/ROXICET  Take 1-2 tablets by mouth every 4 (four) hours as needed for moderate  pain or severe pain.     propranolol 10 MG tablet  Commonly known as:  INDERAL  Take 1 tablet (10 mg total) by mouth 3 (three) times daily.     venlafaxine 37.5 MG tablet  Commonly known as:  EFFEXOR  Take 1 tablet (37.5 mg total) by mouth 2 (two) times daily.     VITAMIN B-12 IJ  Inject 1 Applicatorful as directed every 30 (thirty) days.     Vitamin D (Ergocalciferol) 50000 UNITS Caps capsule  Commonly known as:  DRISDOL  Take 50,000 Units by mouth every 7 (seven) days.         Signed: Hewitt Shorts, MD 01/06/2013, 10:39 AM

## 2013-01-06 NOTE — Progress Notes (Signed)
Ready for discharge home; walker delivered; discharged instructions given and reviewed. Rxs given. . Review of back precautions, back brace, and incision care provided. Patient will make her follow up appointment with Dr. Lovell Sheehan within three weeks.  Discharged home with her daughter.

## 2013-01-19 ENCOUNTER — Encounter: Payer: Self-pay | Admitting: *Deleted

## 2013-03-27 DIAGNOSIS — K2100 Gastro-esophageal reflux disease with esophagitis, without bleeding: Secondary | ICD-10-CM | POA: Diagnosis not present

## 2013-03-27 DIAGNOSIS — M545 Low back pain, unspecified: Secondary | ICD-10-CM | POA: Diagnosis not present

## 2013-03-27 DIAGNOSIS — F411 Generalized anxiety disorder: Secondary | ICD-10-CM | POA: Diagnosis not present

## 2013-03-27 DIAGNOSIS — I10 Essential (primary) hypertension: Secondary | ICD-10-CM | POA: Diagnosis not present

## 2013-06-15 NOTE — Progress Notes (Signed)
This encounter was created in error - please disregard.

## 2013-06-22 DIAGNOSIS — I10 Essential (primary) hypertension: Secondary | ICD-10-CM | POA: Diagnosis not present

## 2013-06-22 DIAGNOSIS — M545 Low back pain, unspecified: Secondary | ICD-10-CM | POA: Diagnosis not present

## 2013-06-22 DIAGNOSIS — E669 Obesity, unspecified: Secondary | ICD-10-CM | POA: Diagnosis not present

## 2013-06-22 DIAGNOSIS — G4700 Insomnia, unspecified: Secondary | ICD-10-CM | POA: Diagnosis not present

## 2013-09-07 DIAGNOSIS — M545 Low back pain, unspecified: Secondary | ICD-10-CM | POA: Diagnosis not present

## 2013-09-07 DIAGNOSIS — M549 Dorsalgia, unspecified: Secondary | ICD-10-CM | POA: Diagnosis not present

## 2013-09-07 DIAGNOSIS — Z6831 Body mass index (BMI) 31.0-31.9, adult: Secondary | ICD-10-CM | POA: Diagnosis not present

## 2013-09-12 DIAGNOSIS — I10 Essential (primary) hypertension: Secondary | ICD-10-CM | POA: Diagnosis not present

## 2013-09-12 DIAGNOSIS — E559 Vitamin D deficiency, unspecified: Secondary | ICD-10-CM | POA: Diagnosis not present

## 2013-09-12 DIAGNOSIS — M545 Low back pain, unspecified: Secondary | ICD-10-CM | POA: Diagnosis not present

## 2013-09-12 DIAGNOSIS — G4700 Insomnia, unspecified: Secondary | ICD-10-CM | POA: Diagnosis not present

## 2013-09-12 DIAGNOSIS — E538 Deficiency of other specified B group vitamins: Secondary | ICD-10-CM | POA: Diagnosis not present

## 2013-09-12 DIAGNOSIS — E039 Hypothyroidism, unspecified: Secondary | ICD-10-CM | POA: Diagnosis not present

## 2013-10-17 DIAGNOSIS — G4700 Insomnia, unspecified: Secondary | ICD-10-CM | POA: Diagnosis not present

## 2013-10-17 DIAGNOSIS — Z23 Encounter for immunization: Secondary | ICD-10-CM | POA: Diagnosis not present

## 2013-10-17 DIAGNOSIS — E538 Deficiency of other specified B group vitamins: Secondary | ICD-10-CM | POA: Diagnosis not present

## 2013-10-17 DIAGNOSIS — E559 Vitamin D deficiency, unspecified: Secondary | ICD-10-CM | POA: Diagnosis not present

## 2013-12-11 DIAGNOSIS — M545 Low back pain: Secondary | ICD-10-CM | POA: Diagnosis not present

## 2013-12-11 DIAGNOSIS — E559 Vitamin D deficiency, unspecified: Secondary | ICD-10-CM | POA: Diagnosis not present

## 2013-12-11 DIAGNOSIS — G4700 Insomnia, unspecified: Secondary | ICD-10-CM | POA: Diagnosis not present

## 2013-12-11 DIAGNOSIS — E039 Hypothyroidism, unspecified: Secondary | ICD-10-CM | POA: Diagnosis not present

## 2013-12-11 DIAGNOSIS — I10 Essential (primary) hypertension: Secondary | ICD-10-CM | POA: Diagnosis not present

## 2014-03-08 DIAGNOSIS — F419 Anxiety disorder, unspecified: Secondary | ICD-10-CM | POA: Diagnosis not present

## 2014-03-08 DIAGNOSIS — E538 Deficiency of other specified B group vitamins: Secondary | ICD-10-CM | POA: Diagnosis not present

## 2014-03-08 DIAGNOSIS — M545 Low back pain: Secondary | ICD-10-CM | POA: Diagnosis not present

## 2014-06-07 DIAGNOSIS — F419 Anxiety disorder, unspecified: Secondary | ICD-10-CM | POA: Diagnosis not present

## 2014-06-07 DIAGNOSIS — E6609 Other obesity due to excess calories: Secondary | ICD-10-CM | POA: Diagnosis not present

## 2014-06-07 DIAGNOSIS — M545 Low back pain: Secondary | ICD-10-CM | POA: Diagnosis not present

## 2014-06-07 DIAGNOSIS — E559 Vitamin D deficiency, unspecified: Secondary | ICD-10-CM | POA: Diagnosis not present

## 2014-06-07 DIAGNOSIS — E538 Deficiency of other specified B group vitamins: Secondary | ICD-10-CM | POA: Diagnosis not present

## 2014-08-20 DIAGNOSIS — R799 Abnormal finding of blood chemistry, unspecified: Secondary | ICD-10-CM | POA: Diagnosis not present

## 2014-08-20 DIAGNOSIS — E538 Deficiency of other specified B group vitamins: Secondary | ICD-10-CM | POA: Diagnosis not present

## 2014-08-20 DIAGNOSIS — E6609 Other obesity due to excess calories: Secondary | ICD-10-CM | POA: Diagnosis not present

## 2014-08-20 DIAGNOSIS — E559 Vitamin D deficiency, unspecified: Secondary | ICD-10-CM | POA: Diagnosis not present

## 2014-09-02 DIAGNOSIS — M545 Low back pain: Secondary | ICD-10-CM | POA: Diagnosis not present

## 2014-09-02 DIAGNOSIS — Z79891 Long term (current) use of opiate analgesic: Secondary | ICD-10-CM | POA: Diagnosis not present

## 2014-09-02 DIAGNOSIS — D509 Iron deficiency anemia, unspecified: Secondary | ICD-10-CM | POA: Diagnosis not present

## 2014-09-02 DIAGNOSIS — M4806 Spinal stenosis, lumbar region: Secondary | ICD-10-CM | POA: Diagnosis not present

## 2014-09-09 ENCOUNTER — Encounter: Payer: Self-pay | Admitting: Internal Medicine

## 2014-09-26 ENCOUNTER — Ambulatory Visit: Payer: Medicare Other | Admitting: Nurse Practitioner

## 2014-10-14 ENCOUNTER — Encounter: Payer: Self-pay | Admitting: Internal Medicine

## 2014-10-14 ENCOUNTER — Ambulatory Visit (INDEPENDENT_AMBULATORY_CARE_PROVIDER_SITE_OTHER): Payer: Medicare Other | Admitting: Nurse Practitioner

## 2014-10-14 ENCOUNTER — Encounter: Payer: Self-pay | Admitting: Nurse Practitioner

## 2014-10-14 ENCOUNTER — Other Ambulatory Visit: Payer: Self-pay

## 2014-10-14 VITALS — BP 118/72 | HR 70 | Temp 99.0°F | Ht 65.0 in | Wt 166.0 lb

## 2014-10-14 DIAGNOSIS — K257 Chronic gastric ulcer without hemorrhage or perforation: Secondary | ICD-10-CM | POA: Diagnosis not present

## 2014-10-14 DIAGNOSIS — D649 Anemia, unspecified: Secondary | ICD-10-CM

## 2014-10-14 DIAGNOSIS — K2100 Gastro-esophageal reflux disease with esophagitis, without bleeding: Secondary | ICD-10-CM

## 2014-10-14 MED ORDER — PEG 3350-KCL-NA BICARB-NACL 420 G PO SOLR: 4000.0000 mL | ORAL | Status: DC

## 2014-10-14 NOTE — H&P (Addendum)
Primary Care Physician:  Robert Bellow, MD Primary Gastroenterologist:  Dr. Gala Romney  Chief Complaint  Patient presents with  . Anemia  . Nausea  . Abdominal Pain    HPI:   69 year old female referred to [place] by primary care for iron deficiency anemia. She has a history of gastric ulcers, reflux esophagitis, and polyps on colonoscopy. Patient last seen on 09/02/2014 by PCP and per notes have been having an episode of dizziness and syncope when standing. Hemoglobin and hematocrit on 09/12/2013 was 13.1. On 06/07/2014 it was repeated and her hemoglobin has dropped to 10.1 and MCV 78. On 08/20/2014 she had an anemia profile which showed hemoglobin 10.1, MCV 72.6, iron 23, iron sat 5%, TIBC 473, ferritin 8, vitamin B12 320. Hemoccult stool tests were issued and one could not be red and the second had not been returned. Last surveillance endoscopy 06/05/2009 which showed healed gastric ulcer. Last diagnostic colonoscopy on 04/07/2009 which found redundant colon and 2 sigmoid polyps. Polyps on pathology showed hyperplastic polyp. Capsule endoscopy placed on 10/15/2009. Findings included small bowel loop dilated and a few erosions but nothing specific to explain her pain. Iron deficiency anemia and heme-positive stool likely secondary to gastric bypass versus friable anastomosis. Recommend routine follow-up. PCP for monitoring of hemoglobin and hematocrit.  Today she states she cannot tolerate iron. She is decreased energy in the past 6-12 months. Her stools are dark but formed also with abdominal pain chronically since gastric bypass. Her pain becomes much worse when she stops her Nexium and Nexium while it takes the edge off does not resolve her symptoms. She does have occasional burning nausea and excess belching. Denies hematochezia. She has had decreased frequency of bowel movements although she is not eating as well and she should. She's had decreased appetite since her gastric bypass stenosis  chronic GERD she notes subjective weight loss but is unable to qualify because she does not have a scale in her home. She takes over-the-counter Nexium because prescription Nexium is too expensive. Denies shortness of breath, fever, chills, unintentional weight loss. Denies chest pain. Denies any other upper or lower GI symptoms.  Past Medical History  Diagnosis Date  . Anxiety   . Thyroid disease   . Morbid obesity   . PONV (postoperative nausea and vomiting)   . Recurrent upper respiratory infection (URI)   . GERD (gastroesophageal reflux disease)     h/o bleeding ulcer, hosp., [facility]  . Blood transfusion     /w bleeding ulcer & post knee replacement- 2012  . Neuralgia     spondylosis & stenosis  . Hiatal hernia   . Depression   . Hypothyroidism   . Chronic kidney disease     h/o renal calculi, h/o lithotripsy  . Obsessive-compulsive disorder   . Sleep apnea     [facility], CPAP not used in 3 months, states she has a new machine  but hasn't used   . Shortness of breath     with exertion  . Headache(784.0)   . Arthritis     Past Surgical History  Procedure Laterality Date  . Gastric bypass    . Cholecystectomy    . Abdominal hysterectomy    . Appendectomy    . Eye surgery      bilateral cataracts removed, w/IOL  . Tonsillectomy      as a child  . Parathyroidectomy    . Cesarean section    . Peripherally inserted central catheter insertion  PICC line for treatment of MRSA  . Anterior cervical decomp/discectomy fusion  12/09/2010    Procedure: ANTERIOR CERVICAL DECOMPRESSION/DISCECTOMY FUSION 3 LEVELS;  Surgeon: Ophelia Charter;  Location: [facility];  Service: Neurosurgery;  Laterality: N/A;  Cervical three-four,Cervical four-five,Cervical Five-Six,Cervical Six-Seven ANTERIOR CERVICAL DECOMPRESSION WITH FUSION INTERBODY PROTHESIS PLATING AND BONEGRAFT  . Joint replacement      07/2010 &  2002- respectively- both knees     Current Outpatient  Prescriptions  Medication Sig Dispense Refill  . ALPRAZolam (XANAX) 1 MG tablet Take 1 mg by mouth 2 (two) times daily.    . Cyanocobalamin (VITAMIN B-12 IJ) Inject 1 Applicatorful as directed every 30 (thirty) days.     . furosemide (LASIX) 40 MG tablet Take 40 mg by mouth daily as needed (for fluid retention).     [name] HYDROcodone-acetaminophen (NORCO) 10-325 MG per tablet Take 1 tablet by mouth every 6 (six) hours as needed for moderate pain.     . hydrOXYzine (ATARAX/VISTARIL) 25 MG tablet Take 1-2 tablets (25-50 mg total) by mouth 3 (three) times daily as needed for anxiety. 120 tablet 1  . levothyroxine (SYNTHROID, LEVOTHROID) 75 MCG tablet Take 75 mcg by mouth daily before breakfast.    . metoCLOPramide (REGLAN) 10 MG tablet Take 1 tablet (10 mg total) by mouth every 6 (six) hours as needed (nausea/headache). 20 tablet 0  . propranolol (INDERAL) 10 MG tablet Take 1 tablet (10 mg total) by mouth 3 (three) times daily. 90 tablet 1  . Vitamin D, Ergocalciferol, (DRISDOL) 50000 UNITS CAPS Take 50,000 Units by mouth every 7 (seven) days.    . cyclobenzaprine (FLEXERIL) 10 MG tablet Take 0.5-1 tablets (5-10 mg total) by mouth 3 (three) times daily as needed for muscle spasms. (Patient not taking: Reported on 10/14/2014) 50 tablet 0  . esomeprazole (NEXIUM) 40 MG capsule Take 40 mg by mouth 2 (two) times daily.     [name] HYDROcodone-acetaminophen (NORCO) 5-325 MG per tablet Take 1 tablet by mouth every 6 (six) hours as needed for moderate pain or severe pain. (Patient not taking: Reported on 10/14/2014) 40 tablet 0  . Multiple Vitamin (MULTIVITAMIN) tablet Take 1 tablet by mouth daily.      . mupirocin ointment (BACTROBAN) 2 % Place 1 application into the nose daily as needed (for scratches).    . oxyCODONE-acetaminophen (PERCOCET/ROXICET) 5-325 MG per tablet Take 1-2 tablets by mouth every 4 (four) hours as needed for moderate pain or severe pain. (Patient not taking: Reported on 10/14/2014) 40 tablet 0  .  polyethylene glycol-electrolytes (TRILYTE) 420 G solution Take 4,000 mLs by mouth as directed. 4000 mL 0   No current facility-administered medications for this visit.    Allergies as of 10/14/2014 - Review Complete 10/14/2014  Allergen Reaction Noted  . Benzodiazepines Other (See Comments) 12/03/2011  . Codeine Nausea Only   . Escitalopram oxalate Nausea Only 12/02/2010  . Nsaids Other (See Comments) 12/22/2012  . Sertraline hcl Nausea Only   . Statins Nausea And Vomiting   . Tramadol hcl Other (See Comments) 12/22/2012    Family History  Problem Relation Age of Onset  . Anesthesia problems Neg Hx   . Hypotension Neg Hx   . Malignant hyperthermia Neg Hx   . Pseudochol deficiency Neg Hx   . Dementia Neg Hx   . Alcohol abuse Neg Hx   . Drug abuse Neg Hx   . Depression Neg Hx   . Paranoid behavior Neg Hx   . Schizophrenia  Neg Hx   . Seizures Neg Hx   . Sexual abuse Neg Hx   . Physical abuse Neg Hx   . Anxiety disorder Mother   . Anxiety disorder Sister   . Bipolar disorder Daughter   . OCD Daughter   . ADD / ADHD Daughter   . Colon cancer Neg Hx     Social History   Social History  . Marital Status: Married    Spouse Name: N/A  . Number of Children: N/A  . Years of Education: N/A   Occupational History  . Not on file.   Social History Main Topics  . Smoking status: Former Smoker    Types: Cigarettes    Quit date: 01/19/1996  . Smokeless tobacco: Never Used  . Alcohol Use: No  . Drug Use: No  . Sexual Activity: Not Currently    Birth Control/ Protection: Surgical   Other Topics Concern  . Not on file   Social History Narrative    Review of Systems: General: Negative for anorexia, weight loss, fever, chills, fatigue, weakness. Eyes: Negative for vision changes.  ENT: Negative for hoarseness, difficulty swallowing. CV: Negative for chest pain, angina, palpitations, peripheral edema.  Respiratory: Negative for dyspnea at rest, cough, sputum, wheezing.    GI: See history of present illness. Derm: Negative for rash or itching.  Endo: Negative for unusual weight change.  Heme: Negative for bruising or bleeding. Allergy: Negative for rash or hives.    Physical Exam: BP 118/72 mmHg  Pulse 70  Temp(Src) 99 F (37.2 C) (Oral)  Ht 5\' 5"  (1.651 m)  Wt 166 lb (75.297 kg)  BMI 27.62 kg/m2 General:   Alert and oriented. Pleasant and cooperative. Well-nourished and well-developed.  Head:  Normocephalic and atraumatic. Eyes:  Without icterus, sclera clear and conjunctiva pink.  Ears:  Normal auditory acuity. Cardiovascular:  S1, S2 present without murmurs appreciated. Normal pulses noted. Extremities without clubbing or edema. Respiratory:  Clear to auscultation bilaterally. No wheezes, rales, or rhonchi. No distress.  Gastrointestinal:  +BS, soft, and non-distended. Mild epigastric TTP. No HSM noted. No guarding or rebound. No masses appreciated.  Rectal:  Deferred  Musculoskalatal:  Symmetrical without gross deformities. Normal posture. Skin:  Intact without significant lesions or rashes. Neurologic:  Alert and oriented x4;  grossly normal neurologically. Psych:  Alert and cooperative. Normal mood and affect.   10/16/2014 9:51 AM

## 2014-10-14 NOTE — Patient Instructions (Signed)
1. We will schedule your procedures for you. 2. Further recommendations to be based on results your procedure. 3. Return for follow-up in 2 months.

## 2014-10-16 NOTE — Progress Notes (Signed)
CC'D TO PCP °

## 2014-10-16 NOTE — Assessment & Plan Note (Addendum)
Patient with a history of reflux esophagitis, GERD, and gastric ulcers. Last surveillance endoscopy 06/05/2009 which showed healed gastric ulcer. She is currently on OTC Nexium 40 mg twice daily which does nto resolve her symtpoms, but "takes the edge off." She now presents with iron deficiency anemia. Recommend continue Nexium twice daily and we'll arrange for colonoscopy and endoscopy as noted below. Return for follow-up in 2 months.

## 2014-10-16 NOTE — Assessment & Plan Note (Addendum)
69 year old female with a history of GERD, reflux esophagitis, and gastric ulcers. Noted persistent symptoms despite bid PPI, although she takes the OTC Nexium as she cannot afford Rx Nexium.  However, she is now presenting with recurrent iron deficiency anemia. Given her presentation we'll proceed with colonoscopy and endoscopy as noted below. Return for follow-up in 2 months. Continue taking Nexium as ordered.

## 2014-10-16 NOTE — Assessment & Plan Note (Signed)
Patient with a history of iron deficiency anemia with extensive workup in 2011 colonoscopy, endoscopy, and Capsule endoscopy. File determinations that her abdominal pain and iron deficiency anemia with heme positive stool likely due to friable mucosa, gastric ulcers, and possible friable anastomosis status post gastric bypass. She again is presenting with iron deficiency anemia although she is having some breakthrough GERD symptoms despite Nexium twice a day. At this point we will evaluate her with diagnostic colonoscopy and upper endoscopy. Can consider capsule endoscopy in the future if no expiration for iron deficiency anemia found.  Proceed with TCS and EGD in the OR with propofol/MAC with Dr. Gala Romney in near future: the risks, benefits, and alternatives have been discussed with the patient in detail. The patient states understanding and desires to proceed.  The patient is not on any anticoagulants. She is on Xanax twice a day, Flexeril, Norco every 6 hours, Atarax. Due to polypharmacy we'll plan for the procedure and the OR with propofol/MAC to promote adequate sedation.

## 2014-10-18 ENCOUNTER — Other Ambulatory Visit: Payer: Self-pay

## 2014-10-28 ENCOUNTER — Other Ambulatory Visit (HOSPITAL_COMMUNITY): Payer: Self-pay

## 2014-11-11 NOTE — Patient Instructions (Signed)
[name]  11/11/2014     @PREFPERIOPPHARMACY @   Your procedure is scheduled on  11/14/2014   Report to [facility] at  26  A.M.  Call this number if you have problems the morning of surgery:  [phone]   Remember:  Do not eat food or drink liquids after midnight.  Take these medicines the morning of surgery with A SIP OF WATER  Xanax, flexaril, nexium, Norco (or percocet), hydroxyzine, reglan, levothyroxine, propranolol.   Do not wear jewelry, make-up or nail polish.  Do not wear lotions, powders, or perfumes.  You may wear deodorant.  Do not shave 48 hours prior to surgery.  Men may shave face and neck.  Do not bring valuables to the hospital.  [facility] is not responsible for any belongings or valuables.  Contacts, dentures or bridgework may not be worn into surgery.  Leave your suitcase in the car.  After surgery it may be brought to your room.  For patients admitted to the hospital, discharge time will be determined by your treatment team.  Patients discharged the day of surgery will not be allowed to drive home.   Name and phone number of your driver:   family Special instructions:  Follow the diet and prep instructions given to you by Dr Roseanne Kaufman office.  Please read over the following fact sheets that you were given. Pain Booklet, Coughing and Deep Breathing, Surgical Site Infection Prevention, Anesthesia Post-op Instructions and Care and Recovery After Surgery      Esophagogastroduodenoscopy Esophagogastroduodenoscopy (EGD) is a procedure that is used to examine the lining of the esophagus, stomach, and first part of the small intestine (duodenum). A long, flexible, lighted tube with a camera attached (endoscope) is inserted down the throat to view these organs. This procedure is done to detect problems or abnormalities, such as inflammation, bleeding, ulcers, or growths, in order to treat them. The procedure lasts 5-20 minutes. It is usually an  outpatient procedure, but it may need to be performed in a hospital in emergency cases. LET [facility] CARE PROVIDER KNOW ABOUT:  Any allergies you have.  All medicines you are taking, including vitamins, herbs, eye drops, creams, and over-the-counter medicines.  Previous problems you or members of your family have had with the use of anesthetics.  Any blood disorders you have.  Previous surgeries you have had.  Medical conditions you have. RISKS AND COMPLICATIONS Generally, this is a safe procedure. However, problems can occur and include:  Infection.  Bleeding.  Tearing (perforation) of the esophagus, stomach, or duodenum.  Difficulty breathing or not being able to breathe.  Excessive sweating.  Spasms of the larynx.  Slowed heartbeat.  Low blood pressure. BEFORE THE PROCEDURE  Do not eat or drink anything after midnight on the night before the procedure or as directed by your health care provider.  Do not take your regular medicines before the procedure if your health care provider asks you not to. Ask your health care provider about changing or stopping those medicines.  If you wear dentures, be prepared to remove them before the procedure.  Arrange for someone to drive you home after the procedure. PROCEDURE  A numbing medicine (local anesthetic) may be sprayed in your throat for comfort and to stop you from gagging or coughing.  You will have an IV tube inserted in a vein in your hand or arm. You will receive medicines and fluids through this tube.  You will be given a medicine to relax you (sedative).  A pain reliever will be given through the IV tube.  A mouth guard may be placed in your mouth to protect your teeth and to keep you from biting on the endoscope.  You will be asked to lie on your left side.  The endoscope will be inserted down your throat and into your esophagus, stomach, and duodenum.  Air will be put through the endoscope to allow your  health care provider to clearly view the lining of your esophagus.  The lining of your esophagus, stomach, and duodenum will be examined. During the exam, your health care provider may:  Remove tissue to be examined under a microscope (biopsy) for inflammation, infection, or other medical problems.  Remove growths.  Remove objects (foreign bodies) that are stuck.  Treat any bleeding with medicines or other devices that stop tissues from bleeding (hot cautery, clipping devices).  Widen (dilate) or stretch narrowed areas of your esophagus and stomach.  The endoscope will be withdrawn. AFTER THE PROCEDURE  You will be taken to a recovery area for observation. Your blood pressure, heart rate, breathing rate, and blood oxygen level will be monitored often until the medicines you were given have worn off.  Do not eat or drink anything until the numbing medicine has worn off and your gag reflex has returned. You may choke.  Your health care provider should be able to discuss his or her findings with you. It will take longer to discuss the test results if any biopsies were taken.   This information is not intended to replace advice given to you by your health care provider. Make sure you discuss any questions you have with your health care provider.   Document Released: 05/07/2004 Document Revised: 01/25/2014 Document Reviewed: 12/08/2011 Elsevier Interactive Patient Education 2016 [employer]. Colonoscopy A colonoscopy is an exam to look at the entire large intestine (colon). This exam can help find problems such as tumors, polyps, inflammation, and areas of bleeding. The exam takes about 1 hour.  LET [facility] CARE PROVIDER KNOW ABOUT:   Any allergies you have.  All medicines you are taking, including vitamins, herbs, eye drops, creams, and over-the-counter medicines.  Previous problems you or members of your family have had with the use of anesthetics.  Any blood disorders you  have.  Previous surgeries you have had.  Medical conditions you have. RISKS AND COMPLICATIONS  Generally, this is a safe procedure. However, as with any procedure, complications can occur. Possible complications include:  Bleeding.  Tearing or rupture of the colon wall.  Reaction to medicines given during the exam.  Infection (rare). BEFORE THE PROCEDURE   Ask your health care provider about changing or stopping your regular medicines.  You may be prescribed an oral bowel prep. This involves drinking a large amount of medicated liquid, starting the day before your procedure. The liquid will cause you to have multiple loose stools until your stool is almost clear or light green. This cleans out your colon in preparation for the procedure.  Do not eat or drink anything else once you have started the bowel prep, unless your health care provider tells you it is safe to do so.  Arrange for someone to drive you home after the procedure. PROCEDURE   You will be given medicine to help you relax (sedative).  You will lie on your side with your knees bent.  A long, flexible tube with a light and  camera on the end (colonoscope) will be inserted through the rectum and into the colon. The camera sends video back to a computer screen as it moves through the colon. The colonoscope also releases carbon dioxide gas to inflate the colon. This helps your health care provider see the area better.  During the exam, your health care provider may take a small tissue sample (biopsy) to be examined under a microscope if any abnormalities are found.  The exam is finished when the entire colon has been viewed. AFTER THE PROCEDURE   Do not drive for 24 hours after the exam.  You may have a small amount of blood in your stool.  You may pass moderate amounts of gas and have mild abdominal cramping or bloating. This is caused by the gas used to inflate your colon during the exam.  Ask when your test  results will be ready and how you will get your results. Make sure you get your test results.   This information is not intended to replace advice given to you by your health care provider. Make sure you discuss any questions you have with your health care provider.   Document Released: 01/02/2000 Document Revised: 10/25/2012 Document Reviewed: 09/11/2012 Elsevier Interactive Patient Education 2016 [employer] PATIENT INSTRUCTIONS POST-ANESTHESIA  IMMEDIATELY FOLLOWING SURGERY:  Do not drive or operate machinery for the first twenty four hours after surgery.  Do not make any important decisions for twenty four hours after surgery or while taking narcotic pain medications or sedatives.  If you develop intractable nausea and vomiting or a severe headache please notify your doctor immediately.  FOLLOW-UP:  Please make an appointment with your surgeon as instructed. You do not need to follow up with anesthesia unless specifically instructed to do so.  WOUND CARE INSTRUCTIONS (if applicable):  Keep a dry clean dressing on the anesthesia/puncture wound site if there is drainage.  Once the wound has quit draining you may leave it open to air.  Generally you should leave the bandage intact for twenty four hours unless there is drainage.  If the epidural site drains for more than 36-48 hours please call the anesthesia department.  QUESTIONS?:  Please feel free to call your physician or the hospital operator if you have any questions, and they will be happy to assist you.

## 2014-11-12 ENCOUNTER — Encounter (HOSPITAL_COMMUNITY)
Admission: RE | Admit: 2014-11-12 | Discharge: 2014-11-12 | Disposition: A | Discharge status: Home / Self Care | Payer: Medicare Other | Source: Ambulatory Visit | Attending: Internal Medicine | Admitting: Internal Medicine

## 2014-11-12 ENCOUNTER — Other Ambulatory Visit: Payer: Self-pay

## 2014-11-12 ENCOUNTER — Encounter (HOSPITAL_COMMUNITY): Payer: Self-pay

## 2014-11-12 DIAGNOSIS — K219 Gastro-esophageal reflux disease without esophagitis: Secondary | ICD-10-CM | POA: Diagnosis not present

## 2014-11-12 DIAGNOSIS — Z79891 Long term (current) use of opiate analgesic: Secondary | ICD-10-CM | POA: Diagnosis not present

## 2014-11-12 DIAGNOSIS — E039 Hypothyroidism, unspecified: Secondary | ICD-10-CM | POA: Diagnosis not present

## 2014-11-12 DIAGNOSIS — E079 Disorder of thyroid, unspecified: Secondary | ICD-10-CM | POA: Diagnosis not present

## 2014-11-12 DIAGNOSIS — G709 Myoneural disorder, unspecified: Secondary | ICD-10-CM | POA: Diagnosis not present

## 2014-11-12 DIAGNOSIS — Z96653 Presence of artificial knee joint, bilateral: Secondary | ICD-10-CM | POA: Diagnosis not present

## 2014-11-12 DIAGNOSIS — Z79899 Other long term (current) drug therapy: Secondary | ICD-10-CM | POA: Diagnosis not present

## 2014-11-12 DIAGNOSIS — D509 Iron deficiency anemia, unspecified: Secondary | ICD-10-CM | POA: Diagnosis not present

## 2014-11-12 DIAGNOSIS — F419 Anxiety disorder, unspecified: Secondary | ICD-10-CM | POA: Diagnosis not present

## 2014-11-12 DIAGNOSIS — Z9884 Bariatric surgery status: Secondary | ICD-10-CM | POA: Diagnosis not present

## 2014-11-12 DIAGNOSIS — K259 Gastric ulcer, unspecified as acute or chronic, without hemorrhage or perforation: Secondary | ICD-10-CM | POA: Diagnosis not present

## 2014-11-12 DIAGNOSIS — Z87891 Personal history of nicotine dependence: Secondary | ICD-10-CM | POA: Diagnosis not present

## 2014-11-12 DIAGNOSIS — I129 Hypertensive chronic kidney disease with stage 1 through stage 4 chronic kidney disease, or unspecified chronic kidney disease: Secondary | ICD-10-CM | POA: Diagnosis not present

## 2014-11-12 DIAGNOSIS — K449 Diaphragmatic hernia without obstruction or gangrene: Secondary | ICD-10-CM | POA: Diagnosis not present

## 2014-11-12 DIAGNOSIS — M1990 Unspecified osteoarthritis, unspecified site: Secondary | ICD-10-CM | POA: Diagnosis not present

## 2014-11-12 DIAGNOSIS — Z8711 Personal history of peptic ulcer disease: Secondary | ICD-10-CM | POA: Diagnosis not present

## 2014-11-12 DIAGNOSIS — G4730 Sleep apnea, unspecified: Secondary | ICD-10-CM | POA: Diagnosis not present

## 2014-11-12 DIAGNOSIS — Z86010 Personal history of colonic polyps: Secondary | ICD-10-CM | POA: Diagnosis not present

## 2014-11-12 DIAGNOSIS — N189 Chronic kidney disease, unspecified: Secondary | ICD-10-CM | POA: Diagnosis not present

## 2014-11-12 LAB — CBC WITH DIFFERENTIAL/PLATELET
Basophils Absolute: 0 10*3/uL (ref 0.0–0.1)
Basophils Relative: 1 %
Eosinophils Absolute: 0.1 10*3/uL (ref 0.0–0.7)
Eosinophils Relative: 2 %
HCT: 31.8 % — ABNORMAL LOW (ref 36.0–46.0)
Hemoglobin: 9.7 g/dL — ABNORMAL LOW (ref 12.0–15.0)
Lymphocytes Relative: 44 %
Lymphs Abs: 2.5 10*3/uL (ref 0.7–4.0)
MCH: 22.5 pg — ABNORMAL LOW (ref 26.0–34.0)
MCHC: 30.5 g/dL (ref 30.0–36.0)
MCV: 73.6 fL — ABNORMAL LOW (ref 78.0–100.0)
Monocytes Absolute: 0.5 10*3/uL (ref 0.1–1.0)
Monocytes Relative: 9 %
Neutro Abs: 2.6 10*3/uL (ref 1.7–7.7)
Neutrophils Relative %: 45 %
Platelets: 332 10*3/uL (ref 150–400)
RBC: 4.32 MIL/uL (ref 3.87–5.11)
RDW: 16.5 % — ABNORMAL HIGH (ref 11.5–15.5)
WBC: 5.8 10*3/uL (ref 4.0–10.5)

## 2014-11-12 LAB — BASIC METABOLIC PANEL
Anion gap: 10 (ref 5–15)
BUN: 17 mg/dL (ref 6–20)
CO2: 26 mmol/L (ref 22–32)
Calcium: 9.4 mg/dL (ref 8.9–10.3)
Chloride: 101 mmol/L (ref 101–111)
Creatinine, Ser: 0.99 mg/dL (ref 0.44–1.00)
GFR calc Af Amer: >60 mL/min (ref >60)
GFR calc non Af Amer: 57 mL/min — ABNORMAL LOW (ref >60)
Glucose, Bld: 113 mg/dL — ABNORMAL HIGH (ref 65–99)
Potassium: 4.7 mmol/L (ref 3.5–5.1)
Sodium: 137 mmol/L (ref 135–145)

## 2014-11-14 ENCOUNTER — Encounter (HOSPITAL_COMMUNITY)
Admission: RE | Disposition: A | Discharge status: Home / Self Care | Payer: Self-pay | Source: Ambulatory Visit | Attending: Internal Medicine

## 2014-11-14 ENCOUNTER — Ambulatory Visit (HOSPITAL_COMMUNITY): Payer: Medicare Other | Admitting: Anesthesiology

## 2014-11-14 ENCOUNTER — Ambulatory Visit (HOSPITAL_COMMUNITY)
Admission: RE | Admit: 2014-11-14 | Discharge: 2014-11-14 | Disposition: A | Discharge status: Home / Self Care | Payer: Medicare Other | Source: Ambulatory Visit | Attending: Internal Medicine | Admitting: Internal Medicine

## 2014-11-14 ENCOUNTER — Encounter (HOSPITAL_COMMUNITY): Payer: Self-pay | Admitting: *Deleted

## 2014-11-14 DIAGNOSIS — G709 Myoneural disorder, unspecified: Secondary | ICD-10-CM | POA: Insufficient documentation

## 2014-11-14 DIAGNOSIS — K219 Gastro-esophageal reflux disease without esophagitis: Secondary | ICD-10-CM | POA: Insufficient documentation

## 2014-11-14 DIAGNOSIS — M1990 Unspecified osteoarthritis, unspecified site: Secondary | ICD-10-CM | POA: Insufficient documentation

## 2014-11-14 DIAGNOSIS — Z8711 Personal history of peptic ulcer disease: Secondary | ICD-10-CM | POA: Insufficient documentation

## 2014-11-14 DIAGNOSIS — D509 Iron deficiency anemia, unspecified: Secondary | ICD-10-CM | POA: Diagnosis not present

## 2014-11-14 DIAGNOSIS — I129 Hypertensive chronic kidney disease with stage 1 through stage 4 chronic kidney disease, or unspecified chronic kidney disease: Secondary | ICD-10-CM | POA: Insufficient documentation

## 2014-11-14 DIAGNOSIS — K449 Diaphragmatic hernia without obstruction or gangrene: Secondary | ICD-10-CM | POA: Insufficient documentation

## 2014-11-14 DIAGNOSIS — E079 Disorder of thyroid, unspecified: Secondary | ICD-10-CM | POA: Insufficient documentation

## 2014-11-14 DIAGNOSIS — E039 Hypothyroidism, unspecified: Secondary | ICD-10-CM | POA: Insufficient documentation

## 2014-11-14 DIAGNOSIS — Z86010 Personal history of colonic polyps: Secondary | ICD-10-CM | POA: Insufficient documentation

## 2014-11-14 DIAGNOSIS — Z9884 Bariatric surgery status: Secondary | ICD-10-CM | POA: Insufficient documentation

## 2014-11-14 DIAGNOSIS — N189 Chronic kidney disease, unspecified: Secondary | ICD-10-CM | POA: Insufficient documentation

## 2014-11-14 DIAGNOSIS — Z79891 Long term (current) use of opiate analgesic: Secondary | ICD-10-CM | POA: Insufficient documentation

## 2014-11-14 DIAGNOSIS — K259 Gastric ulcer, unspecified as acute or chronic, without hemorrhage or perforation: Secondary | ICD-10-CM | POA: Diagnosis not present

## 2014-11-14 DIAGNOSIS — F419 Anxiety disorder, unspecified: Secondary | ICD-10-CM | POA: Insufficient documentation

## 2014-11-14 DIAGNOSIS — G4730 Sleep apnea, unspecified: Secondary | ICD-10-CM | POA: Insufficient documentation

## 2014-11-14 DIAGNOSIS — Z79899 Other long term (current) drug therapy: Secondary | ICD-10-CM | POA: Insufficient documentation

## 2014-11-14 DIAGNOSIS — Z96653 Presence of artificial knee joint, bilateral: Secondary | ICD-10-CM | POA: Insufficient documentation

## 2014-11-14 DIAGNOSIS — D649 Anemia, unspecified: Secondary | ICD-10-CM | POA: Diagnosis not present

## 2014-11-14 DIAGNOSIS — Z87891 Personal history of nicotine dependence: Secondary | ICD-10-CM | POA: Insufficient documentation

## 2014-11-14 HISTORY — PX: BIOPSY: SHX5522

## 2014-11-14 HISTORY — PX: COLONOSCOPY WITH PROPOFOL: SHX5780

## 2014-11-14 HISTORY — PX: ESOPHAGOGASTRODUODENOSCOPY (EGD) WITH PROPOFOL: SHX5813

## 2014-11-14 SURGERY — COLONOSCOPY WITH PROPOFOL
Anesthesia: Monitor Anesthesia Care

## 2014-11-14 MED ORDER — FENTANYL CITRATE (PF) 100 MCG/2ML IJ SOLN: 25.0000 ug | INTRAMUSCULAR | Status: DC | PRN

## 2014-11-14 MED ORDER — LIDOCAINE HCL (CARDIAC) 10 MG/ML IV SOLN
INTRAVENOUS | Status: DC | PRN
  Administered 2014-11-14: 50 mg via INTRAVENOUS

## 2014-11-14 MED ORDER — ONDANSETRON HCL 4 MG/2ML IJ SOLN: 4.0000 mg | Freq: Once | INTRAMUSCULAR | Status: DC | PRN

## 2014-11-14 MED ORDER — MIDAZOLAM HCL 5 MG/5ML IJ SOLN
INTRAMUSCULAR | Status: DC | PRN
  Administered 2014-11-14: 2 mg via INTRAVENOUS

## 2014-11-14 MED ORDER — ONDANSETRON HCL 4 MG/2ML IJ SOLN
4.0000 mg | Freq: Once | INTRAMUSCULAR | Status: AC
  Administered 2014-11-14: 4 mg via INTRAVENOUS

## 2014-11-14 MED ORDER — MIDAZOLAM HCL 2 MG/2ML IJ SOLN
INTRAMUSCULAR | Status: AC
  Filled 2014-11-14: qty 4

## 2014-11-14 MED ORDER — FENTANYL CITRATE (PF) 100 MCG/2ML IJ SOLN
25.0000 ug | INTRAMUSCULAR | Status: AC
  Administered 2014-11-14 (×2): 25 ug via INTRAVENOUS

## 2014-11-14 MED ORDER — GLYCOPYRROLATE 0.2 MG/ML IJ SOLN
0.2000 mg | Freq: Once | INTRAMUSCULAR | Status: AC
  Administered 2014-11-14: 0.2 mg via INTRAVENOUS

## 2014-11-14 MED ORDER — PROPOFOL 500 MG/50ML IV EMUL
INTRAVENOUS | Status: DC | PRN
  Administered 2014-11-14: 10:00:00 via INTRAVENOUS
  Administered 2014-11-14: 150 ug/kg/min via INTRAVENOUS

## 2014-11-14 MED ORDER — FENTANYL CITRATE (PF) 100 MCG/2ML IJ SOLN: INTRAMUSCULAR | Status: DC | PRN

## 2014-11-14 MED ORDER — LIDOCAINE VISCOUS 2 % MT SOLN
15.0000 mL | Freq: Once | OROMUCOSAL | Status: AC
  Administered 2014-11-14: 15 mL via OROMUCOSAL

## 2014-11-14 MED ORDER — LIDOCAINE VISCOUS 2 % MT SOLN
OROMUCOSAL | Status: AC
  Filled 2014-11-14: qty 15

## 2014-11-14 MED ORDER — FENTANYL CITRATE (PF) 100 MCG/2ML IJ SOLN
INTRAMUSCULAR | Status: AC
  Filled 2014-11-14: qty 2

## 2014-11-14 MED ORDER — LACTATED RINGERS IV SOLN
INTRAVENOUS | Status: DC
  Administered 2014-11-14: 1000 mL via INTRAVENOUS
  Administered 2014-11-14: 09:00:00 via INTRAVENOUS

## 2014-11-14 MED ORDER — LIDOCAINE HCL (PF) 1 % IJ SOLN
INTRAMUSCULAR | Status: AC
  Filled 2014-11-14: qty 5

## 2014-11-14 MED ORDER — GLYCOPYRROLATE 0.2 MG/ML IJ SOLN
INTRAMUSCULAR | Status: AC
  Filled 2014-11-14: qty 1

## 2014-11-14 MED ORDER — ONDANSETRON HCL 4 MG/2ML IJ SOLN
INTRAMUSCULAR | Status: AC
Start: 2014-11-14 — End: 2014-11-14
  Filled 2014-11-14: qty 2

## 2014-11-14 MED ORDER — MIDAZOLAM HCL 2 MG/2ML IJ SOLN
INTRAMUSCULAR | Status: AC
  Filled 2014-11-14: qty 2

## 2014-11-14 MED ORDER — MIDAZOLAM HCL 2 MG/2ML IJ SOLN
1.0000 mg | INTRAMUSCULAR | Status: DC | PRN
Start: 2014-11-14 — End: 2014-11-14
  Administered 2014-11-14: 2 mg via INTRAVENOUS

## 2014-11-14 MED ORDER — PROPOFOL 10 MG/ML IV BOLUS
INTRAVENOUS | Status: AC
  Filled 2014-11-14: qty 20

## 2014-11-14 MED ORDER — STERILE WATER FOR IRRIGATION IR SOLN
Status: DC | PRN
  Administered 2014-11-14: 10:00:00

## 2014-11-14 MED ORDER — WATER FOR IRRIGATION, STERILE IR SOLN
Status: DC | PRN
  Administered 2014-11-14: 1000 mL

## 2014-11-14 SURGICAL SUPPLY — 23 items
BLOCK BITE 60FR ADLT L/F BLUE (MISCELLANEOUS) ×2 IMPLANT
DEVICE CLIP HEMOSTAT 235CM (CLIP) IMPLANT
ELECT REM PT RETURN 9FT ADLT (ELECTROSURGICAL)
ELECTRODE REM PT RTRN 9FT ADLT (ELECTROSURGICAL) IMPLANT
FCP BXJMBJMB 240X2.8X (CUTTING FORCEPS)
FORCEPS BIOP RAD 4 LRG CAP 4 (CUTTING FORCEPS) ×2 IMPLANT
FORCEPS BIOP RJ4 240 W/NDL (CUTTING FORCEPS)
FORCEPS BXJMBJMB 240X2.8X (CUTTING FORCEPS) IMPLANT
FORMALIN 10 PREFIL 20ML (MISCELLANEOUS) IMPLANT
INJECTOR/SNARE I SNARE (MISCELLANEOUS) IMPLANT
KIT ENDO PROCEDURE PEN (KITS) ×3 IMPLANT
MANIFOLD NEPTUNE II (INSTRUMENTS) ×2 IMPLANT
NDL SCLEROTHERAPY 25GX240 (NEEDLE) IMPLANT
NEEDLE SCLEROTHERAPY 25GX240 (NEEDLE) IMPLANT
PROBE APC STR FIRE (PROBE) IMPLANT
PROBE INJECTION GOLD (MISCELLANEOUS)
PROBE INJECTION GOLD 7FR (MISCELLANEOUS) IMPLANT
SNARE ROTATE MED OVAL 20MM (MISCELLANEOUS) IMPLANT
SNARE SHORT THROW 13M SML OVAL (MISCELLANEOUS) ×1 IMPLANT
SYR INFLATION 60ML (SYRINGE) ×1 IMPLANT
TRAP SPECIMEN MUCOUS 40CC (MISCELLANEOUS) IMPLANT
TUBING IRRIGATION ENDOGATOR (MISCELLANEOUS) ×2 IMPLANT
WATER STERILE IRR 1000ML POUR (IV SOLUTION) ×2 IMPLANT

## 2014-11-14 NOTE — Anesthesia Postprocedure Evaluation (Signed)
  Anesthesia Post-op Note  Patient: [name]  Procedure(s) Performed: Procedure(s) with comments: COLONOSCOPY WITH PROPOFOL (N/A) - 0954 in cecum, 8 min withdrawel time ESOPHAGOGASTRODUODENOSCOPY (EGD) WITH PROPOFOL (N/A) GASTRIC BIOPSY (N/A)  Patient Location: PACU  Anesthesia Type:MAC  Level of Consciousness: awake, alert , oriented and patient cooperative  Airway and Oxygen Therapy: Patient Spontanous Breathing and Patient connected to nasal cannula oxygen  Post-op Pain: none  Post-op Assessment: Post-op Vital signs reviewed, Patient's Cardiovascular Status Stable, Respiratory Function Stable, Patent Airway, No signs of Nausea or vomiting and Pain level controlled              Post-op Vital Signs: Reviewed and stable  Last Vitals:  Filed Vitals:   11/14/14 1007  BP: 120/65  Pulse: 73  Temp: 36.9 C  Resp: 14    Complications: No apparent anesthesia complications

## 2014-11-14 NOTE — Op Note (Signed)
[facility] [address] [place], [address]   ENDOSCOPY PROCEDURE REPORT  PATIENT: [name], [name]  MR#: [id number] BIRTHDATE: 07-14-1945 , 69  yrs. old GENDER: female ENDOSCOPIST: R.  Garfield Cornea, MD FACP FACG REFERRED BY:  Lemmie Evens, M.D. PROCEDURE DATE:  2014-11-22 PROCEDURE:  EGD w/ biopsy INDICATIONS:  GERD/anemia -iron deficiency?"history of gastric ulcer.  MEDICATIONS: Deep sedation per [facility] ASA CLASS:      Class II  CONSENT: The risks, benefits, limitations, alternatives and imponderables have been discussed.  The potential for biopsy, esophogeal dilation, etc. have also been reviewed.  Questions have been answered.  All parties agreeable.  Please see the history and physical in the medical record for more information.  DESCRIPTION OF PROCEDURE: After the risks benefits and alternatives of the procedure were thoroughly explained, informed consent was obtained.  The    endoscope was introduced through the mouth and advanced to the second portion of the duodenum , limited by Without limitations. The instrument was slowly withdrawn as the mucosa was fully examined. Estimated blood loss is zero unless otherwise noted in this procedure report.    Normal-appearing tubular esophagus.  Stomach surgically altered; (2) small bowel limbs.  2 cm hiatal hernia present.  Patient had (1) 7 mm gastric ulcer at the anastomosis.  Please see photographs.  It appeared to be benign.  There was one short small bowel limb which ended in a blind pouch.  The efferent limb was intubated good 10-15 cm.  This segment of the GI tract appear normal.  Biopsies of the ulcer taken.  Retroflexed views revealed as previously described.     The scope was then withdrawn from the patient and the procedure completed.  COMPLICATIONS: There were no immediate complications. EBL 5 mL ENDOSCOPIC IMPRESSION: Status post prior gastric surgery. Anastomotic  ulcer?"status post biopsy  RECOMMENDATIONS: Follow-up on pathology. See colonoscopy report. Continue Nexium for now.  REPEAT EXAM:  eSigned:  R. Garfield Cornea, MD Rosalita Chessman [facility] 11-22-14 10:10 AM    CC:  CPT CODES: ICD CODES:  The ICD and CPT codes recommended by this software are interpretations from the data that the clinical staff has captured with the software.  The verification of the translation of this report to the ICD and CPT codes and modifiers is the sole responsibility of the health care institution and practicing physician where this report was generated.  [employer]. will not be held responsible for the validity of the ICD and CPT codes included on this report.  AMA assumes no liability for data contained or not contained herein. CPT is a Designer, television/film set of the [employer].

## 2014-11-14 NOTE — Transfer of Care (Signed)
Immediate Anesthesia Transfer of Care Note  Patient: [name]  Procedure(s) Performed: Procedure(s) with comments: COLONOSCOPY WITH PROPOFOL (N/A) - 0954 in cecum, 8 min withdrawel time ESOPHAGOGASTRODUODENOSCOPY (EGD) WITH PROPOFOL (N/A) GASTRIC BIOPSY (N/A)  Patient Location: PACU  Anesthesia Type:MAC  Level of Consciousness: awake, alert , oriented and patient cooperative  Airway & Oxygen Therapy: Patient Spontanous Breathing and Patient connected to nasal cannula oxygen  Post-op Assessment: Report given to RN and Post -op Vital signs reviewed and stable  Post vital signs: Reviewed and stable  Last Vitals:  Filed Vitals:   11/14/14 0915  BP: 121/76  Pulse:   Temp:   Resp: 17    Complications: No apparent anesthesia complications

## 2014-11-14 NOTE — Anesthesia Procedure Notes (Signed)
Procedure Name: MAC Date/Time: 11/14/2014 9:20 AM Performed by: Andree Elk, AMY A Pre-anesthesia Checklist: Patient identified, Timeout performed, Emergency Drugs available, Suction available and Patient being monitored Oxygen Delivery Method: Simple face mask

## 2014-11-14 NOTE — Discharge Instructions (Signed)
Colonoscopy Discharge Instructions  Read the instructions outlined below and refer to this sheet in the next few weeks. These discharge instructions provide you with general information on caring for yourself after you leave the hospital. Your doctor may also give you specific instructions. While your treatment has been planned according to the most current medical practices available, unavoidable complications occasionally occur. If you have any problems or questions after discharge, call Dr. Gala Romney at [phone]. ACTIVITY  You may resume your regular activity, but move at a slower pace for the next 24 hours.   Take frequent rest periods for the next 24 hours.   Walking will help get rid of the air and reduce the bloated feeling in your belly (abdomen).   No driving for 24 hours (because of the medicine (anesthesia) used during the test).    Do not sign any important legal documents or operate any machinery for 24 hours (because of the anesthesia used during the test).  NUTRITION  Drink plenty of fluids.   You may resume your normal diet as instructed by your doctor.   Begin with a light meal and progress to your normal diet. Heavy or fried foods are harder to digest and may make you feel sick to your stomach (nauseated).   Avoid alcoholic beverages for 24 hours or as instructed.  MEDICATIONS  You may resume your normal medications unless your doctor tells you otherwise.  WHAT YOU CAN EXPECT TODAY  Some feelings of bloating in the abdomen.   Passage of more gas than usual.   Spotting of blood in your stool or on the toilet paper.  IF YOU HAD POLYPS REMOVED DURING THE COLONOSCOPY:  No aspirin products for 7 days or as instructed.   No alcohol for 7 days or as instructed.   Eat a soft diet for the next 24 hours.  FINDING OUT THE RESULTS OF YOUR TEST Not all test results are available during your visit. If your test results are not back during the visit, make an appointment  with your caregiver to find out the results. Do not assume everything is normal if you have not heard from your caregiver or the medical facility. It is important for you to follow up on all of your test results.  SEEK IMMEDIATE MEDICAL ATTENTION IF:  You have more than a spotting of blood in your stool.   Your belly is swollen (abdominal distention).   You are nauseated or vomiting.   You have a temperature over 101.  You have abdominal pain or discomfort that is severe or gets worse throughout the day. EGD Discharge instructions Please read the instructions outlined below and refer to this sheet in the next few weeks. These discharge instructions provide you with general information on caring for yourself after you leave the hospital. Your doctor may also give you specific instructions. While your treatment has been planned according to the most current medical practices available, unavoidable complications occasionally occur. If you have any problems or questions after discharge, please call your doctor. ACTIVITY You may resume your regular activity but move at a slower pace for the next 24 hours.  Take frequent rest periods for the next 24 hours.  Walking will help expel (get rid of) the air and reduce the bloated feeling in your abdomen.  No driving for 24 hours (because of the anesthesia (medicine) used during the test).  You may shower.  Do not sign any important legal documents or operate any machinery for 24  hours (because of the anesthesia used during the test).  NUTRITION Drink plenty of fluids.  You may resume your normal diet.  Begin with a light meal and progress to your normal diet.  Avoid alcoholic beverages for 24 hours or as instructed by your caregiver.  MEDICATIONS You may resume your normal medications unless your caregiver tells you otherwise.  WHAT YOU CAN EXPECT TODAY You may experience abdominal discomfort such as a feeling of fullness or gas pains.   FOLLOW-UP Your doctor will discuss the results of your test with you.  SEEK IMMEDIATE MEDICAL ATTENTION IF ANY OF THE FOLLOWING OCCUR: Excessive nausea (feeling sick to your stomach) and/or vomiting.  Severe abdominal pain and distention (swelling).  Trouble swallowing.  Temperature over 101 F (37.8 C).  Rectal bleeding or vomiting of blood.     Peptic ulcer information provided  Further recommendations to follow pending review of pathology reportPeptic Ulcer A peptic ulcer is a sore in the lining of your esophagus (esophageal ulcer), stomach (gastric ulcer), or in the first part of your small intestine (duodenal ulcer). The ulcer causes erosion into the deeper tissue. CAUSES  Normally, the lining of the stomach and the small intestine protects itself from the acid that digests food. The protective lining can be damaged by:  An infection caused by a bacterium called Helicobacter pylori (H. pylori).  Regular use of nonsteroidal anti-inflammatory drugs (NSAIDs), such as ibuprofen or aspirin.  Smoking tobacco. Other risk factors include being older than 47, drinking alcohol excessively, and having a family history of ulcer disease.  SYMPTOMS   Burning pain or gnawing in the area between the chest and the belly button.  Heartburn.  Nausea and vomiting.  Bloating. The pain can be worse on an empty stomach and at night. If the ulcer results in bleeding, it can cause:  Black, tarry stools.  Vomiting of bright red blood.  Vomiting of coffee-ground-looking materials. DIAGNOSIS  A diagnosis is usually made based upon your history and an exam. Other tests and procedures may be performed to find the cause of the ulcer. Finding a cause will help determine the best treatment. Tests and procedures may include:  Blood tests, stool tests, or breath tests to check for the bacterium H. pylori.  An upper gastrointestinal (GI) series of the esophagus, stomach, and small intestine.  An  endoscopy to examine the esophagus, stomach, and small intestine.  A biopsy. TREATMENT  Treatment may include:  Eliminating the cause of the ulcer, such as smoking, NSAIDs, or alcohol.  Medicines to reduce the amount of acid in your digestive tract.  Antibiotic medicines if the ulcer is caused by the H. pylori bacterium.  An upper endoscopy to treat a bleeding ulcer.  Surgery if the bleeding is severe or if the ulcer created a hole somewhere in the digestive system. HOME CARE INSTRUCTIONS   Avoid tobacco, alcohol, and caffeine. Smoking can increase the acid in the stomach, and continued smoking will impair the healing of ulcers.  Avoid foods and drinks that seem to cause discomfort or aggravate your ulcer.  Only take medicines as directed by your caregiver. Do not substitute over-the-counter medicines for prescription medicines without talking to your caregiver.  Keep any follow-up appointments and tests as directed. SEEK MEDICAL CARE IF:   Your do not improve within 7 days of starting treatment.  You have ongoing indigestion or heartburn. SEEK IMMEDIATE MEDICAL CARE IF:   You have sudden, sharp, or persistent abdominal pain.  You have bloody  or dark black, tarry stools.  You vomit blood or vomit that looks like coffee grounds.  You become light-headed, weak, or feel faint.  You become sweaty or clammy. MAKE SURE YOU:   Understand these instructions.  Will watch your condition.  Will get help right away if you are not doing well or get worse.   This information is not intended to replace advice given to you by your health care provider. Make sure you discuss any questions you have with your health care provider.   Document Released: 01/02/2000 Document Revised: 01/25/2014 Document Reviewed: 08/04/2011 Elsevier Interactive Patient Education [employer].

## 2014-11-14 NOTE — H&P (View-Only) (Signed)
CC'D TO PCP °

## 2014-11-14 NOTE — Anesthesia Preprocedure Evaluation (Signed)
Anesthesia Evaluation  Patient identified by MRN, date of birth, ID band Patient awake    Reviewed: Allergy & Precautions, NPO status , Patient's Chart, lab work & pertinent test results, reviewed documented beta blocker date and time   History of Anesthesia Complications (+) PONV and history of anesthetic complications  Airway Mallampati: III  TM Distance: >3 FB     Dental  (+) Teeth Intact   Pulmonary shortness of breath, sleep apnea , Recent URI , former smoker,    breath sounds clear to auscultation       Cardiovascular hypertension, Pt. on medications and Pt. on home beta blockers  Rhythm:Regular Rate:Normal     Neuro/Psych  Headaches, PSYCHIATRIC DISORDERS Anxiety Depression  Neuromuscular disease    GI/Hepatic hiatal hernia, PUD, GERD  ,Gastric bypass Chronic nausea    Endo/Other  Hypothyroidism   Renal/GU      Musculoskeletal   Abdominal   Peds  Hematology  (+) anemia ,   Anesthesia Other Findings   Reproductive/Obstetrics                             Anesthesia Physical Anesthesia Plan  ASA: III  Anesthesia Plan: MAC   Post-op Pain Management:    Induction: Intravenous  Airway Management Planned: Simple Face Mask  Additional Equipment:   Intra-op Plan:   Post-operative Plan:   Informed Consent: I have reviewed the patients History and Physical, chart, labs and discussed the procedure including the risks, benefits and alternatives for the proposed anesthesia with the patient or authorized representative who has indicated his/her understanding and acceptance.     Plan Discussed with:   Anesthesia Plan Comments:         Anesthesia Quick Evaluation

## 2014-11-14 NOTE — Op Note (Signed)
[facility] [address] [place], [address]   COLONOSCOPY PROCEDURE REPORT  PATIENT: [name], [name]  MR#: [id number] BIRTHDATE: 06-Aug-1945 , 69  yrs. old GENDER: female ENDOSCOPIST: R.  Garfield Cornea, MD FACP [facility] REFERRED BY: PROCEDURE DATE:  26-Nov-2014 PROCEDURE:   Colonoscopy, diagnostic INDICATIONS:iron deficiency anemia. MEDICATIONS: Deep sedation per Dr.  [facility] ASA CLASS:       Class II  CONSENT: The risks, benefits, alternatives and imponderables including but not limited to bleeding, perforation as well as the possibility of a missed lesion have been reviewed.  The potential for biopsy, lesion removal, etc. have also been discussed. Questions have been answered.  All parties agreeable.  Please see the history and physical in the medical record for more information.  DESCRIPTION OF PROCEDURE:   After the risks benefits and alternatives of the procedure were thoroughly explained, informed consent was obtained.  The digital rectal exam revealed no abnormalities of the rectum.   The     endoscope was introduced through the anus and advanced to the   . No adverse events experienced.   The quality of the prep was adequate  The instrument was then slowly withdrawn as the colon was fully examined. Estimated blood loss is zero unless otherwise noted in this procedure report.      COLON FINDINGS: Normal-appearing rectal mucosa.  Redundant colon?"requiring external abdominal pressure and changing of the patient's position to reach the cecum.  Normal-appearing colonic mucosa.  Please note patient readily admits to not taking the second half of the prep this morning thinking she had taken enough.  There was quite a bit of greasy viscous effluent throughout the colon which had to be copiously washed and suctioned to gain minimally adequate views of the colonic mucosa.  The colonic mucosa appeared normal. Retroflexion was performed.  .  Withdrawal time=8 minutes 0 seconds.  The scope was withdrawn and the procedure completed. COMPLICATIONS: There were no immediate complications.  ENDOSCOPIC IMPRESSION: Redundant but otherwise normal-appearing colon.  RECOMMENDATIONS: See EGD report.  eSigned:  R. Garfield Cornea, MD Rosalita Chessman [facility] 11-26-2014 10:15 AM   cc:  CPT CODES: ICD CODES:  The ICD and CPT codes recommended by this software are interpretations from the data that the clinical staff has captured with the software.  The verification of the translation of this report to the ICD and CPT codes and modifiers is the sole responsibility of the health care institution and practicing physician where this report was generated.  [employer] [place]. will not be held responsible for the validity of the ICD and CPT codes included on this report.  AMA assumes no liability for data contained or not contained herein. CPT is a Designer, television/film set of the [employer].

## 2014-11-14 NOTE — Interval H&P Note (Signed)
History and Physical Interval Note:  11/14/2014 9:08 AM  [name]  has presented today for surgery, with the diagnosis of ANEMIA/HISTORY OF GASTRIC ULCER/GERD  The various methods of treatment have been discussed with the patient and family. After consideration of risks, benefits and other options for treatment, the patient has consented to  Procedure(s) with comments: COLONOSCOPY WITH PROPOFOL (N/A) - 0915 ESOPHAGOGASTRODUODENOSCOPY (EGD) WITH PROPOFOL (N/A) as a surgical intervention .  The patient's history has been reviewed, patient examined, no change in status, stable for surgery.  I have reviewed the patient's chart and labs.  Questions were answered to the patient's satisfaction.     [name]  No change. EGD and colonoscopy per plan.The risks, benefits, limitations, imponderables and alternatives regarding both EGD and colonoscopy have been reviewed with the patient. Questions have been answered. All parties agreeable.

## 2014-11-15 ENCOUNTER — Encounter (HOSPITAL_COMMUNITY): Payer: Self-pay | Admitting: Internal Medicine

## 2014-11-17 ENCOUNTER — Encounter: Payer: Self-pay | Admitting: Internal Medicine

## 2014-11-19 ENCOUNTER — Telehealth: Payer: Self-pay

## 2014-11-19 NOTE — Telephone Encounter (Signed)
Per RMR- Send letter to patient.  Send copy of letter with path to referring provider and PCP.   Recommend follow-up office visit in 12 weeks for consideration of repeat EGD to verify ulcer healing. If IDA does not resolve, further evaluation may be needed.

## 2014-11-19 NOTE — Telephone Encounter (Signed)
PATIENT ALREADY SCHEDULED FOR 2 MONTH FU WITH EG IN November

## 2014-11-19 NOTE — Telephone Encounter (Signed)
Letter mailed to the pt. 

## 2014-11-29 DIAGNOSIS — E559 Vitamin D deficiency, unspecified: Secondary | ICD-10-CM | POA: Diagnosis not present

## 2014-11-29 DIAGNOSIS — R634 Abnormal weight loss: Secondary | ICD-10-CM | POA: Diagnosis not present

## 2014-11-29 DIAGNOSIS — E6609 Other obesity due to excess calories: Secondary | ICD-10-CM | POA: Diagnosis not present

## 2014-11-29 DIAGNOSIS — Z23 Encounter for immunization: Secondary | ICD-10-CM | POA: Diagnosis not present

## 2014-11-29 DIAGNOSIS — Z13228 Encounter for screening for other metabolic disorders: Secondary | ICD-10-CM | POA: Diagnosis not present

## 2014-11-29 DIAGNOSIS — Z79891 Long term (current) use of opiate analgesic: Secondary | ICD-10-CM | POA: Diagnosis not present

## 2014-11-29 DIAGNOSIS — M545 Low back pain: Secondary | ICD-10-CM | POA: Diagnosis not present

## 2014-11-29 DIAGNOSIS — E538 Deficiency of other specified B group vitamins: Secondary | ICD-10-CM | POA: Diagnosis not present

## 2014-11-29 DIAGNOSIS — R799 Abnormal finding of blood chemistry, unspecified: Secondary | ICD-10-CM | POA: Diagnosis not present

## 2014-12-17 ENCOUNTER — Ambulatory Visit: Payer: Self-pay | Admitting: Nurse Practitioner

## 2015-02-03 ENCOUNTER — Ambulatory Visit: Payer: Medicare Other | Admitting: Nurse Practitioner

## 2015-02-26 DIAGNOSIS — E039 Hypothyroidism, unspecified: Secondary | ICD-10-CM | POA: Diagnosis not present

## 2015-02-26 DIAGNOSIS — M545 Low back pain: Secondary | ICD-10-CM | POA: Diagnosis not present

## 2015-02-26 DIAGNOSIS — E538 Deficiency of other specified B group vitamins: Secondary | ICD-10-CM | POA: Diagnosis not present

## 2015-02-26 DIAGNOSIS — R634 Abnormal weight loss: Secondary | ICD-10-CM | POA: Diagnosis not present

## 2015-02-26 DIAGNOSIS — Z79891 Long term (current) use of opiate analgesic: Secondary | ICD-10-CM | POA: Diagnosis not present

## 2015-04-08 DIAGNOSIS — Z87891 Personal history of nicotine dependence: Secondary | ICD-10-CM | POA: Diagnosis not present

## 2015-04-08 DIAGNOSIS — E039 Hypothyroidism, unspecified: Secondary | ICD-10-CM | POA: Diagnosis not present

## 2015-04-08 DIAGNOSIS — D649 Anemia, unspecified: Secondary | ICD-10-CM | POA: Diagnosis not present

## 2015-04-08 DIAGNOSIS — M79672 Pain in left foot: Secondary | ICD-10-CM | POA: Diagnosis not present

## 2015-04-08 DIAGNOSIS — I10 Essential (primary) hypertension: Secondary | ICD-10-CM | POA: Diagnosis not present

## 2015-04-08 DIAGNOSIS — S01111A Laceration without foreign body of right eyelid and periocular area, initial encounter: Secondary | ICD-10-CM | POA: Diagnosis not present

## 2015-04-08 DIAGNOSIS — D62 Acute posthemorrhagic anemia: Secondary | ICD-10-CM | POA: Diagnosis not present

## 2015-04-08 DIAGNOSIS — W010XXA Fall on same level from slipping, tripping and stumbling without subsequent striking against object, initial encounter: Secondary | ICD-10-CM | POA: Diagnosis not present

## 2015-04-08 DIAGNOSIS — S0990XA Unspecified injury of head, initial encounter: Secondary | ICD-10-CM | POA: Diagnosis not present

## 2015-04-08 DIAGNOSIS — R55 Syncope and collapse: Secondary | ICD-10-CM | POA: Diagnosis not present

## 2015-04-08 DIAGNOSIS — S0181XA Laceration without foreign body of other part of head, initial encounter: Secondary | ICD-10-CM | POA: Diagnosis not present

## 2015-04-08 DIAGNOSIS — K274 Chronic or unspecified peptic ulcer, site unspecified, with hemorrhage: Secondary | ICD-10-CM | POA: Diagnosis not present

## 2015-04-08 DIAGNOSIS — S199XXA Unspecified injury of neck, initial encounter: Secondary | ICD-10-CM | POA: Diagnosis not present

## 2015-04-08 DIAGNOSIS — S99922A Unspecified injury of left foot, initial encounter: Secondary | ICD-10-CM | POA: Diagnosis not present

## 2015-04-09 DIAGNOSIS — E039 Hypothyroidism, unspecified: Secondary | ICD-10-CM | POA: Diagnosis present

## 2015-04-09 DIAGNOSIS — Z87891 Personal history of nicotine dependence: Secondary | ICD-10-CM | POA: Diagnosis not present

## 2015-04-09 DIAGNOSIS — S99922A Unspecified injury of left foot, initial encounter: Secondary | ICD-10-CM | POA: Diagnosis not present

## 2015-04-09 DIAGNOSIS — E669 Obesity, unspecified: Secondary | ICD-10-CM | POA: Diagnosis present

## 2015-04-09 DIAGNOSIS — R55 Syncope and collapse: Secondary | ICD-10-CM | POA: Diagnosis not present

## 2015-04-09 DIAGNOSIS — Z79899 Other long term (current) drug therapy: Secondary | ICD-10-CM | POA: Diagnosis not present

## 2015-04-09 DIAGNOSIS — S0990XA Unspecified injury of head, initial encounter: Secondary | ICD-10-CM | POA: Diagnosis not present

## 2015-04-09 DIAGNOSIS — M79672 Pain in left foot: Secondary | ICD-10-CM | POA: Diagnosis not present

## 2015-04-09 DIAGNOSIS — Z885 Allergy status to narcotic agent status: Secondary | ICD-10-CM | POA: Diagnosis not present

## 2015-04-09 DIAGNOSIS — F418 Other specified anxiety disorders: Secondary | ICD-10-CM | POA: Diagnosis present

## 2015-04-09 DIAGNOSIS — D62 Acute posthemorrhagic anemia: Secondary | ICD-10-CM | POA: Diagnosis present

## 2015-04-09 DIAGNOSIS — K274 Chronic or unspecified peptic ulcer, site unspecified, with hemorrhage: Secondary | ICD-10-CM | POA: Diagnosis present

## 2015-04-09 DIAGNOSIS — S0181XA Laceration without foreign body of other part of head, initial encounter: Secondary | ICD-10-CM | POA: Diagnosis present

## 2015-04-09 DIAGNOSIS — I10 Essential (primary) hypertension: Secondary | ICD-10-CM | POA: Diagnosis present

## 2015-04-09 DIAGNOSIS — Z6829 Body mass index (BMI) 29.0-29.9, adult: Secondary | ICD-10-CM | POA: Diagnosis not present

## 2015-04-09 DIAGNOSIS — D649 Anemia, unspecified: Secondary | ICD-10-CM | POA: Diagnosis not present

## 2015-04-09 DIAGNOSIS — S199XXA Unspecified injury of neck, initial encounter: Secondary | ICD-10-CM | POA: Diagnosis not present

## 2015-04-09 DIAGNOSIS — Z9884 Bariatric surgery status: Secondary | ICD-10-CM | POA: Diagnosis not present

## 2015-04-15 ENCOUNTER — Encounter: Payer: Self-pay | Admitting: Gastroenterology

## 2015-04-15 ENCOUNTER — Ambulatory Visit (INDEPENDENT_AMBULATORY_CARE_PROVIDER_SITE_OTHER): Payer: Medicare Other | Admitting: Gastroenterology

## 2015-04-15 ENCOUNTER — Other Ambulatory Visit: Payer: Self-pay

## 2015-04-15 VITALS — BP 125/73 | HR 56 | Temp 97.2°F | Ht 64.0 in | Wt 167.2 lb

## 2015-04-15 DIAGNOSIS — E538 Deficiency of other specified B group vitamins: Secondary | ICD-10-CM

## 2015-04-15 DIAGNOSIS — Z9884 Bariatric surgery status: Secondary | ICD-10-CM | POA: Diagnosis not present

## 2015-04-15 DIAGNOSIS — K257 Chronic gastric ulcer without hemorrhage or perforation: Secondary | ICD-10-CM | POA: Diagnosis not present

## 2015-04-15 DIAGNOSIS — D509 Iron deficiency anemia, unspecified: Secondary | ICD-10-CM

## 2015-04-15 MED ORDER — SUCRALFATE 1 GM/10ML PO SUSP: 1.0000 g | Freq: Three times a day (TID) | ORAL | Status: DC

## 2015-04-15 NOTE — Progress Notes (Signed)
Please let patient know we can try short-term Carafate to see if this helps her upper abd pain, nausea. rx sent to pharmacy.

## 2015-04-15 NOTE — Patient Instructions (Addendum)
1. Please have your labs done the week of April 10th.  2. Start Fusion Plus once daily on empty stomach. Samples provided.  3. Hematology referral for iron deficiency anemia.

## 2015-04-15 NOTE — Progress Notes (Signed)
Primary Care Physician: Robert Bellow, MD  Primary Gastroenterologist:  Garfield Cornea, MD   Chief Complaint  Patient presents with  . Anemia  . Follow-up    HPI: [name] is a 70 y.o. female here for follow up of anemia. Seen in 09/2014 for IDA. August 2016 hemoglobin 10.1, MCV 72.6, iron 23, TIBC 473, iron saturations 5%, ferritin 8, vitamin B12 320. She has a history of remote GI workup for IDA and heme positive stools back in 2011. Upper endoscopy 06/05/2009 which showed healed gastric ulcer. Last diagnostic colonoscopy on 04/07/2009 which found redundant colon and 2 sigmoid polyps. Polyps on pathology showed hyperplastic polyp. Capsule endoscopy placed on 10/15/2009. Findings included small bowel loop dilated and a few erosions but nothing specific to explain her pain. Iron deficiency anemia and heme-positive stool likely secondary to gastric bypass versus friable anastomosis. Gastric bypass in 2007.  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 Anastomotic ulcer present. Biopsy unremarkable. Colonoscopy with redundant colon but otherwise unremarkable.  She went to the emergency department at [facility] back on March 21 due to the laceration of the left eyebrow after a fall just prior to arrival. She states she became dizzy and may have passed out. She is not sure. Further evaluation revealed a hemoglobin of 7.5, hematocrit 25.2 MCV 72.8, platelets 214,000, BUN 19, creatinine 0.75, total bilirubin 0.3, alkaline phosphatase 99, AST 26, ALT 19. She was heme negative. She received 2 units of packed red blood cells. Day of discharge her hemoglobin was 9.9.  She states that she continues to feel weak. She craves dirt. Appetite is poor. Her daughter notes that she has gained 10 pounds that she moved in with her less  than 2 months ago. Patient is having to force himself to eat. No vomiting. She does have some nausea. She takes Reglan 3-4 times per day for years under the direction of Dr. Karie Kirks. Has not noted any side effects. States it works very well for her. She has some mild epigastric pain. Bowel movements regular. She denies any melena or rectal bleeding.    Current Outpatient Prescriptions  Medication Sig Dispense Refill  . ALPRAZolam (XANAX) 1 MG tablet Take 1 mg by mouth 2 (two) times daily.    . Cyanocobalamin (VITAMIN B-12 IJ) Inject 1 Applicatorful as directed every 30 (thirty) days.     . cyclobenzaprine (FLEXERIL) 10 MG tablet Take 0.5-1 tablets (5-10 mg total) by mouth 3 (three) times daily as needed for muscle spasms. 50 tablet 0  . esomeprazole (NEXIUM) 40 MG capsule Take 40 mg by mouth 2 (two) times daily.     [name] gabapentin (NEURONTIN) 300 MG capsule TK 1 C PO 2 TO 3 TIMES A DAY FOR NERVE PAIN  11  . HYDROcodone-acetaminophen (NORCO) 10-325 MG per tablet Take 1 tablet by mouth every 6 (six) hours as needed for moderate pain.     . hydrOXYzine (ATARAX/VISTARIL) 25 MG tablet Take 1-2 tablets (25-50 mg total) by mouth 3 (three) times daily as needed for anxiety. 120 tablet 1  . levothyroxine (SYNTHROID, LEVOTHROID) 75 MCG tablet Take 75 mcg by mouth daily before breakfast.    . metoCLOPramide (REGLAN) 10 MG tablet Take 1 tablet (10 mg total) by mouth every 6 (six) hours as needed (nausea/headache). 20 tablet 0  . Multiple  Vitamin (MULTIVITAMIN) tablet Take 1 tablet by mouth daily.      . mupirocin ointment (BACTROBAN) 2 % Place 1 application into the nose daily as needed (for scratches).    . propranolol (INDERAL) 10 MG tablet Take 1 tablet (10 mg total) by mouth 3 (three) times daily. 90 tablet 1  . venlafaxine (EFFEXOR) 37.5 MG tablet TK 1 T PO BID FOR DEPRESSION  11  . Vitamin D, Ergocalciferol, (DRISDOL) 50000 UNITS CAPS Take 50,000 Units by mouth every 7 (seven) days.    [name] zolpidem  (AMBIEN) 5 MG tablet TK 1 T PO QD HS AS NEEDED FOR SLEEP  5   No current facility-administered medications for this visit.    Allergies as of 04/15/2015 - Review Complete 04/15/2015  Allergen Reaction Noted  . Benzodiazepines Other (See Comments) 12/03/2011  . Codeine Nausea Only   . Escitalopram oxalate Nausea Only 12/02/2010  . Nsaids Other (See Comments) 12/22/2012  . Sertraline hcl Nausea Only   . Statins Nausea And Vomiting   . Tramadol hcl Other (See Comments) 12/22/2012    ROS:  General: Negative for anorexia, weight loss, fever, chills, fatigue, weakness. ENT: Negative for hoarseness, difficulty swallowing , nasal congestion. CV: Negative for chest pain, angina, palpitations, dyspnea on exertion, peripheral edema.  Respiratory: Negative for dyspnea at rest, dyspnea on exertion, cough, sputum, wheezing.  GI: See history of present illness. GU:  Negative for dysuria, hematuria, urinary incontinence, urinary frequency, nocturnal urination.  Endo: Negative for unusual weight change.    Physical Examination:   BP 125/73 mmHg  Pulse 56  Temp(Src) 97.2 F (36.2 C)  Ht 5\' 4"  (1.626 m)  Wt 167 lb 3.2 oz (75.841 kg)  BMI 28.69 kg/m2  General: Well-nourished, well-developed in no acute distress.  Eyes: No icterus. Mouth: Oropharyngeal mucosa moist and pink , no lesions erythema or exudate. Lungs: Clear to auscultation bilaterally.  Heart: Regular rate and rhythm, no murmurs rubs or gallops.  Abdomen: Bowel sounds are normal, mild epigastric tenderness, nondistended, no hepatosplenomegaly or masses, no abdominal bruits or hernia , no rebound or guarding.   Extremities: No lower extremity edema. No clubbing or deformities. Neuro: Alert and oriented x 4   Skin: Warm and dry, no jaundice.   Psych: Alert and cooperative, normal mood and affect.  Labs:  See above  Imaging Studies: No results found.

## 2015-04-15 NOTE — Assessment & Plan Note (Addendum)
Chronic iron deficiency anemia status post gastric bypass around 2007. She has anastomotic ulceration noted on EGD back in October. She denies seeing a hematologist or receiving IV infusions. Recently had a drop in her hemoglobin is 7.5, symptomatic with possible syncopal episode resulting in a fall as well as laceration to the left eyebrow. She received 2 units of packed red blood cells. Suspect her iron deficiency anemia multifactorial in the setting of radius gastric bypass/malabsorption as well as chronic occult GI bleeding from anastomotic ulceration.  We will update labs in 1 week. Proceed with hematology referral for possible iron infusions. FusionPlus once daily. Samples provided. Add Carafate short-term for symptom control. Patient is utilizing Reglan chronically for nausea, has not noted any side effects. We discussed possibility of permanent tardive dyskinesia but at this time she does not want to stop therapy.

## 2015-04-15 NOTE — Addendum Note (Signed)
Addended by: Mahala Menghini on: 04/15/2015 02:08 PM   Modules accepted: Orders

## 2015-04-15 NOTE — Assessment & Plan Note (Signed)
On replacement therapy 

## 2015-04-16 NOTE — Progress Notes (Signed)
Received a fax from the pharmacy. Insurance will not cover carafate suspension. I called [facility], insurance will cover it in pill form. rx has been changed from suspension to tablets. pts daughter is aware. I have advised her that they will have to crush the tablets and place in a small amount of water. She verbalized understanding.

## 2015-04-16 NOTE — Progress Notes (Signed)
CC'D TO PCP °

## 2015-04-21 DIAGNOSIS — D509 Iron deficiency anemia, unspecified: Secondary | ICD-10-CM | POA: Diagnosis not present

## 2015-04-21 DIAGNOSIS — Z9884 Bariatric surgery status: Secondary | ICD-10-CM | POA: Diagnosis not present

## 2015-04-21 DIAGNOSIS — E538 Deficiency of other specified B group vitamins: Secondary | ICD-10-CM | POA: Diagnosis not present

## 2015-04-21 DIAGNOSIS — S0101XD Laceration without foreign body of scalp, subsequent encounter: Secondary | ICD-10-CM | POA: Diagnosis not present

## 2015-04-22 ENCOUNTER — Other Ambulatory Visit (HOSPITAL_COMMUNITY)
Admission: RE | Admit: 2015-04-22 | Discharge: 2015-04-22 | Disposition: A | Discharge status: Home / Self Care | Payer: Medicare Other | Source: Ambulatory Visit | Attending: Hematology & Oncology | Admitting: Hematology & Oncology

## 2015-04-22 DIAGNOSIS — D649 Anemia, unspecified: Secondary | ICD-10-CM | POA: Diagnosis not present

## 2015-04-22 LAB — IRON AND TIBC
Iron: 40 ug/dL (ref 28–170)
Saturation Ratios: 9 % — ABNORMAL LOW (ref 10.4–31.8)
TIBC: 437 ug/dL (ref 250–450)
UIBC: 397 ug/dL

## 2015-04-22 LAB — VITAMIN B12: Vitamin B-12: 495 pg/mL (ref 180–914)

## 2015-04-22 LAB — GLUCOSE, RANDOM: Glucose, Bld: 92 mg/dL (ref 65–99)

## 2015-04-22 LAB — FERRITIN: FERRITIN: 10 ng/mL — AB (ref 11–307)

## 2015-04-22 LAB — FOLATE: FOLATE: 7.8 ng/mL (ref >5.9)

## 2015-04-24 NOTE — Progress Notes (Signed)
Reviewed and agree.

## 2015-04-25 ENCOUNTER — Other Ambulatory Visit (HOSPITAL_COMMUNITY)
Admission: RE | Admit: 2015-04-25 | Discharge: 2015-04-25 | Disposition: A | Discharge status: Home / Self Care | Payer: Medicare Other | Source: Ambulatory Visit | Attending: Family Medicine | Admitting: Family Medicine

## 2015-04-25 DIAGNOSIS — D649 Anemia, unspecified: Secondary | ICD-10-CM | POA: Diagnosis not present

## 2015-04-25 LAB — CBC WITH DIFFERENTIAL/PLATELET
BASOS ABS: 0.1 10*3/uL (ref 0.0–0.1)
BASOS PCT: 1 %
EOS ABS: 0.2 10*3/uL (ref 0.0–0.7)
EOS PCT: 3 %
HCT: 36.2 % (ref 36.0–46.0)
HEMOGLOBIN: 11.1 g/dL — AB (ref 12.0–15.0)
Lymphocytes Relative: 42 %
Lymphs Abs: 2.9 10*3/uL (ref 0.7–4.0)
MCH: 24.1 pg — ABNORMAL LOW (ref 26.0–34.0)
MCHC: 30.7 g/dL (ref 30.0–36.0)
MCV: 78.5 fL (ref 78.0–100.0)
Monocytes Absolute: 0.5 10*3/uL (ref 0.1–1.0)
Monocytes Relative: 7 %
NEUTROS PCT: 47 %
Neutro Abs: 3.3 10*3/uL (ref 1.7–7.7)
PLATELETS: 214 10*3/uL (ref 150–400)
RBC: 4.61 MIL/uL (ref 3.87–5.11)
RDW: 23.2 % — ABNORMAL HIGH (ref 11.5–15.5)
WBC: 7 10*3/uL (ref 4.0–10.5)

## 2015-04-25 LAB — RETICULOCYTES
RBC.: 4.61 MIL/uL (ref 3.87–5.11)
RETIC CT PCT: 0.7 % (ref 0.4–3.1)
Retic Count, Absolute: 32.3 10*3/uL (ref 19.0–186.0)

## 2015-05-12 ENCOUNTER — Ambulatory Visit (HOSPITAL_COMMUNITY): Payer: Self-pay | Admitting: Hematology & Oncology

## 2015-05-13 ENCOUNTER — Encounter: Payer: Self-pay | Admitting: Gastroenterology

## 2015-05-13 NOTE — Progress Notes (Signed)
Lab Results  Component Value Date   WBC 7.0 04/25/2015   HGB 11.1* 04/25/2015   HCT 36.2 04/25/2015   MCV 78.5 04/25/2015   PLT 214 04/25/2015   Lab Results  Component Value Date   IRON 40 04/22/2015   TIBC 437 04/22/2015   FERRITIN 10* 04/22/2015  Reviewed labs done by PCP. Hematology consult pending for next month. Return OV here in 3 months with RMR.

## 2015-05-29 ENCOUNTER — Ambulatory Visit (HOSPITAL_COMMUNITY): Payer: Self-pay | Admitting: Oncology

## 2015-05-29 NOTE — Assessment & Plan Note (Deleted)
B12 deficiency.  On B12 replacement.

## 2015-05-29 NOTE — Progress Notes (Signed)
Rescheduled

## 2015-05-29 NOTE — Assessment & Plan Note (Deleted)
Iron deficiency anemia secondary to chronic GI blood loss.  EGD/Colonoscopy by Dr. Gala Romney on 11/14/2014 was unimpressive except for an anastomic ulcer, S/P gastric surgery.  I personally reviewed and went over laboratory results with the patient.  The results are noted within this dictation.  Calculated iron deficit based upon labs in April 2017 is calculated at 471 mg.  Order placed for IV Feraheme 510 mg next week based upon above mentioned iron deficit calculation.  Labs in 4 weeks: CBC diff, iron/TIBC, ferritin.  Return in 4 weeks for follow-up.

## 2015-06-09 ENCOUNTER — Encounter (HOSPITAL_COMMUNITY): Payer: Medicare Other | Attending: Oncology | Admitting: Oncology

## 2015-06-09 ENCOUNTER — Encounter (HOSPITAL_COMMUNITY): Payer: Self-pay | Admitting: Oncology

## 2015-06-09 VITALS — BP 115/71 | HR 61 | Temp 99.2°F | Resp 16 | Ht 65.5 in | Wt 156.0 lb

## 2015-06-09 DIAGNOSIS — E538 Deficiency of other specified B group vitamins: Secondary | ICD-10-CM

## 2015-06-09 DIAGNOSIS — D508 Other iron deficiency anemias: Secondary | ICD-10-CM | POA: Insufficient documentation

## 2015-06-09 DIAGNOSIS — Z9884 Bariatric surgery status: Secondary | ICD-10-CM

## 2015-06-09 DIAGNOSIS — Z9049 Acquired absence of other specified parts of digestive tract: Secondary | ICD-10-CM | POA: Insufficient documentation

## 2015-06-09 DIAGNOSIS — Z818 Family history of other mental and behavioral disorders: Secondary | ICD-10-CM | POA: Insufficient documentation

## 2015-06-09 DIAGNOSIS — D509 Iron deficiency anemia, unspecified: Secondary | ICD-10-CM | POA: Diagnosis not present

## 2015-06-09 DIAGNOSIS — K922 Gastrointestinal hemorrhage, unspecified: Secondary | ICD-10-CM

## 2015-06-09 DIAGNOSIS — D500 Iron deficiency anemia secondary to blood loss (chronic): Secondary | ICD-10-CM

## 2015-06-09 DIAGNOSIS — K909 Intestinal malabsorption, unspecified: Secondary | ICD-10-CM | POA: Diagnosis not present

## 2015-06-09 DIAGNOSIS — Z9889 Other specified postprocedural states: Secondary | ICD-10-CM | POA: Insufficient documentation

## 2015-06-09 DIAGNOSIS — Z87891 Personal history of nicotine dependence: Secondary | ICD-10-CM | POA: Insufficient documentation

## 2015-06-09 DIAGNOSIS — Z808 Family history of malignant neoplasm of other organs or systems: Secondary | ICD-10-CM | POA: Insufficient documentation

## 2015-06-09 DIAGNOSIS — Z79899 Other long term (current) drug therapy: Secondary | ICD-10-CM | POA: Insufficient documentation

## 2015-06-09 DIAGNOSIS — E079 Disorder of thyroid, unspecified: Secondary | ICD-10-CM | POA: Insufficient documentation

## 2015-06-09 NOTE — Assessment & Plan Note (Addendum)
Iron deficiency anemia secondary to chronic GI blood loss and presumed malabsorption.  EGD/Colonoscopy by Dr. Gala Romney on 11/14/2014 was unimpressive except for an anastomic ulcer, S/P gastric surgery. Intolerant to oral iron replacement resulting in nausea, vomiting, abdominal pain, and exacerbation of chronic constipation.  Problem list is updated with resolution/deletion of some issues that are no longer active, duplicates, or have been resolved.  I personally reviewed and went over laboratory results with the patient.  The results are noted within this dictation.  Calculated iron deficit based upon labs in April 2017 is calculated at 471 mg.  Order placed for IV Feraheme 510 mg next week based upon above mentioned iron deficit calculation with the development of a supportive therapy plan.  Labs the day of IV iron infusion: CBC diff, iron/TIBC, ferritin, serum copper.  Labs in ~ 6 weeks: CBC diff, iron/TIBC, ferritin.  Return in 6 weeks for follow-up.

## 2015-06-09 NOTE — Patient Instructions (Addendum)
[place] at [facility] Discharge Instructions  RECOMMENDATIONS MADE BY THE CONSULTANT AND ANY TEST RESULTS WILL BE SENT TO YOUR REFERRING PHYSICIAN.  Exam and discussion with Kirby Crigler, PA and Dr. Whitney Muse.  Labs same day as Feraheme  Labs in 6 weeks and see Dr. Whitney Muse the same day   Thank you for choosing [place] at [facility] to provide your oncology and hematology care.  To afford each patient quality time with our provider, please arrive at least 15 minutes before your scheduled appointment time.   Beginning January 23rd 2017 lab work for the [facility] will be done in the  Main lab at [facility] on 1st floor. If you have a lab appointment with the [facility] please come in thru the  Main Entrance and check in at the main information desk  You need to re-schedule your appointment should you arrive 10 or more minutes late.  We strive to give you quality time with our providers, and arriving late affects you and other patients whose appointments are after yours.  Also, if you no show three or more times for appointments you may be dismissed from the clinic at the providers discretion.     Again, thank you for choosing [facility].  Our hope is that these requests will decrease the amount of time that you wait before being seen by our physicians.       _____________________________________________________________  Should you have questions after your visit to [facility], please contact our office at [phone] between the hours of 8:30 a.m. and 4:30 p.m.  Voicemails left after 4:30 p.m. will not be returned until the following business day.  For prescription refill requests, have your pharmacy contact our office.         Resources For Cancer Patients and their Caregivers ? American Cancer Society: Can assist with transportation, wigs, general needs, runs Look Good Feel Better.         929 460 7510 ? Cancer Care: Provides financial assistance, online support groups, medication/co-pay assistance.  1-800-813-HOPE 830-807-5384) ? [place] Assists [place] cancer patients and their families through emotional , educational and financial support.  971-500-3446 ? [place] DSS Where to apply for food stamps, Medicaid and utility assistance. 9105397332 ? [facility]: Transportation to medical appointments. 641 303 8219 ? Social Security Administration: May apply for disability if have a Stage IV cancer. (203)481-2313 (716)757-5688 ? [employer], Disability and Transit Services: Assists with nutrition, care and transit needs. [place] Support Programs: @10RELATIVEDAYS @ > Cancer Support Group  2nd Tuesday of the month 1pm-2pm, Journey Room  > Creative Journey  3rd Tuesday of the month 1130am-1pm, Journey Room  > Look Good Feel Better  1st Wednesday of the month 10am-12 noon, Journey Room (Call [place] to register [phone])

## 2015-06-09 NOTE — Assessment & Plan Note (Addendum)
B12 deficiency.  On IM B12 replacement managed by primary care provider, Dr. Karie Kirks.

## 2015-06-09 NOTE — Progress Notes (Signed)
[facility] Hematology/Oncology Consultation   Name: [name]      MRN: [id number]    Date: 06/09/2015 Time:1:36 PM   REFERRING PHYSICIAN:  Neil Crouch, PA-C (GI)  REASON FOR CONSULT:  Iron deficiency anemia   DIAGNOSIS:  Normocytic, normochromic anemia with preservation of WBC and platelet count with iron studies indicative of iron deficiency anemia in the setting of gastric bypass in 2007.  HISTORY OF PRESENT ILLNESS:   [name] is a 70 year old white American female with a past medical history significant for PUD, hypothyroidism, HTN, hypercholesterolemia, GERD, gastric ulcer, anxiety, H/O gastric bypass surgery (presumed Roux En Y procedure) at [facility] in 2007/2008 and B12 deficiency who is referred to [facility] for iron deficiency anemia.  I personally reviewed and went over laboratory results with the patient.  The results are noted within this dictation.  Most recent laboratory work from the beginning of April 2017 demonstrates a hemoglobin of 11.1 g/dL, saturation ratio of 9%, and ferritin of 10. Folate and vitamin B12 are within normal limits. White blood cell count simply count is within normal limits as well.  I personally reviewed and went over radiographic studies with the patient.  The results are noted within this dictation. Imaging studies are reviewed and noncontributory.   Chart reviewed. She underwent EGD and colonoscopy by Dr. Gala Romney on 11/14/2014. Colonoscopy is unimpressive and EGD demonstrates an anastomotic ulcer, status post prior gastric surgery.  I personally reviewed and went over pathology results with the patient.  The pathology is reviewed and negative for any dysplasia or malignancy.  According to Neil Crouch' last note: Chronic iron deficiency anemia status post gastric bypass around 2007. She has anastomotic ulceration noted on EGD back in October. She denies seeing a hematologist or receiving IV infusions.  Recently had a drop in her hemoglobin is 7.5, symptomatic with possible syncopal episode resulting in a fall as well as laceration to the left eyebrow. She received 2 units of packed red blood cells. Suspect her iron deficiency anemia multifactorial in the setting of radius gastric bypass/malabsorption as well as chronic occult GI bleeding from anastomotic ulceration.  Patient lives with her daughter. She is status post gastric surgery for obesity with her weight now being down approximately 150 pounds compared to her preoperative weight of over 300 pounds. She thinks she underwent a Roux-en-Y procedure. This was performed at [facility]. She did have a syncopal/LOC episode. This resulted in hospitalization. She admits to weakness 1 year. She admits to recurrent falls, none recently. Her appetite is fair. She denies any vaginal bleeding.  She denies pagophagia, she notes she craves dirt. She has been on oral iron but is intolerant, causing nausea, abdominal pain and vomiting.   Review of Systems  Constitutional: Positive for weight loss (secondary to gastric bypass for obesity) and malaise/fatigue. Negative for fever, chills and diaphoresis.       Admits to dirt cravings.  Denies ice cravings.  HENT: Negative.   Eyes: Negative.   Respiratory: Negative.  Negative for hemoptysis.   Cardiovascular: Negative.   Gastrointestinal: Positive for nausea (Secondary to PO iron), vomiting (Secondary to PO iron), abdominal pain (Secondary to PO iron) and constipation (Chronic). Negative for diarrhea, blood in stool and melena.  Genitourinary: Negative.  Negative for hematuria.  Musculoskeletal: Positive for falls (H/O).  Skin: Negative.   Neurological: Positive for loss of consciousness (H/O of LOC resulting in falls  and hospitalization) and weakness. Negative for dizziness, tingling, tremors, sensory change, speech change, focal weakness and seizures.  Endo/Heme/Allergies: Negative.     Psychiatric/Behavioral: Negative.    She reports that she was started on oral iron supplement that worsened her chronic constipation but also cause nausea with vomiting, and abdominal pain.  PAST MEDICAL HISTORY:   Past Medical History  Diagnosis Date  . Anxiety   . Thyroid disease   . Morbid obesity ([place])   . PONV (postoperative nausea and vomiting)   . Recurrent upper respiratory infection (URI)   . GERD (gastroesophageal reflux disease)     h/o bleeding ulcer, hosp., [facility]  . Blood transfusion     /w bleeding ulcer & post knee replacement- 2012  . Neuralgia     spondylosis & stenosis  . Hiatal hernia   . Depression   . Hypothyroidism   . Chronic kidney disease     h/o renal calculi, h/o lithotripsy  . Obsessive-compulsive disorder   . Sleep apnea     [facility], CPAP not used in 3 months, states she has a new machine  but hasn't used   . Shortness of breath     with exertion  . Headache(784.0)   . Arthritis     ALLERGIES: Allergies  Allergen Reactions  . Benzodiazepines Other (See Comments)    Cloudy thinking, memory loss, withdrawal symptoms when trying to stop without any other medication for detox.   . Codeine Nausea Only  . Escitalopram Oxalate Nausea Only  . Nsaids Other (See Comments)    Bleeding ulcer   . Sertraline Hcl Nausea Only  . Statins Nausea And Vomiting  . Tramadol Hcl Other (See Comments)    Daughter doesn't want mother to take due to the fact that the medication made the daughter have a seizure       MEDICATIONS: I have reviewed the patient's current medications.    Current Outpatient Prescriptions on File Prior to Visit  Medication Sig Dispense Refill  . ALPRAZolam (XANAX) 1 MG tablet Take 1 mg by mouth 2 (two) times daily.    . Cyanocobalamin (VITAMIN B-12 IJ) Inject 1 Applicatorful as directed every 30 (thirty) days.     [name] esomeprazole (NEXIUM) 40 MG capsule Take 40 mg by mouth 2 (two) times daily.     [name] gabapentin  (NEURONTIN) 300 MG capsule TK 1 C PO 2 TO 3 TIMES A DAY FOR NERVE PAIN  11  . HYDROcodone-acetaminophen (NORCO) 10-325 MG per tablet Take 1 tablet by mouth every 6 (six) hours as needed for moderate pain.     . hydrOXYzine (ATARAX/VISTARIL) 25 MG tablet Take 1-2 tablets (25-50 mg total) by mouth 3 (three) times daily as needed for anxiety. 120 tablet 1  . levothyroxine (SYNTHROID, LEVOTHROID) 75 MCG tablet Take 75 mcg by mouth daily before breakfast.    . metoCLOPramide (REGLAN) 10 MG tablet Take 1 tablet (10 mg total) by mouth every 6 (six) hours as needed (nausea/headache). 20 tablet 0  . Multiple Vitamin (MULTIVITAMIN) tablet Take 1 tablet by mouth daily.      . mupirocin ointment (BACTROBAN) 2 % Place 1 application into the nose daily as needed (for scratches).    . propranolol (INDERAL) 10 MG tablet Take 1 tablet (10 mg total) by mouth 3 (three) times daily. 90 tablet 1  . sucralfate (CARAFATE) 1 GM/10ML suspension Take 10 mLs (1 g total) by mouth 4 (four) times daily -  with meals and  at bedtime. 420 mL 0  . venlafaxine (EFFEXOR) 37.5 MG tablet TK 1 T PO BID FOR DEPRESSION  11  . Vitamin D, Ergocalciferol, (DRISDOL) 50000 UNITS CAPS Take 50,000 Units by mouth every 7 (seven) days.    [name] zolpidem (AMBIEN) 5 MG tablet TK 1 T PO QD HS AS NEEDED FOR SLEEP  5  . cyclobenzaprine (FLEXERIL) 10 MG tablet Take 0.5-1 tablets (5-10 mg total) by mouth 3 (three) times daily as needed for muscle spasms. (Patient not taking: Reported on 06/09/2015) 50 tablet 0   No current facility-administered medications on file prior to visit.     PAST SURGICAL HISTORY Past Surgical History  Procedure Laterality Date  . Gastric bypass    . Cholecystectomy    . Abdominal hysterectomy    . Appendectomy    . Eye surgery      bilateral cataracts removed, w/IOL  . Tonsillectomy      as a child  . Parathyroidectomy    . Cesarean section    . Peripherally inserted central catheter insertion      PICC line for  treatment of MRSA  . Anterior cervical decomp/discectomy fusion  12/09/2010    Procedure: ANTERIOR CERVICAL DECOMPRESSION/DISCECTOMY FUSION 3 LEVELS;  Surgeon: Ophelia Charter;  Location: [facility];  Service: Neurosurgery;  Laterality: N/A;  Cervical three-four,Cervical four-five,Cervical Five-Six,Cervical Six-Seven ANTERIOR CERVICAL DECOMPRESSION WITH FUSION INTERBODY PROTHESIS PLATING AND BONEGRAFT  . Joint replacement      07/2010 &  2002- respectively- both knees   . Colonoscopy with propofol N/A 11/14/2014    RMR: redundant colon but otherwise normal  . Esophagogastroduodenoscopy (egd) with propofol N/A 11/14/2014    RMR"s/p gastric surgery/anastomtic ulcer  . Biopsy N/A 11/14/2014    Procedure: GASTRIC BIOPSY;  Surgeon: Daneil Dolin, MD;  Location: [facility];  Service: Endoscopy;  Laterality: N/A;    FAMILY HISTORY: Family History  Problem Relation Age of Onset  . Anesthesia problems Neg Hx   . Hypotension Neg Hx   . Malignant hyperthermia Neg Hx   . Pseudochol deficiency Neg Hx   . Dementia Neg Hx   . Alcohol abuse Neg Hx   . Drug abuse Neg Hx   . Depression Neg Hx   . Paranoid behavior Neg Hx   . Schizophrenia Neg Hx   . Seizures Neg Hx   . Sexual abuse Neg Hx   . Physical abuse Neg Hx   . Colon cancer Neg Hx   . Anxiety disorder Mother   . Anxiety disorder Sister   . Bipolar disorder Daughter   . OCD Daughter   . ADD / ADHD Daughter   . Heart attack Father   Mother passed with the age of 58 secondary to motor vehicle accident Father passed with the age of 52 from an myocardial infarction 3 daughters ages 31, 45, and 31 years old. One son age 63 with history of heart disease.  SOCIAL HISTORY:  reports that she quit smoking about 17 years ago. Her smoking use included Cigarettes. She has a 30 pack-year smoking history. She has never used smokeless tobacco. She reports that she does not drink alcohol or use illicit drugs. She's married 20 years. This is her second  marriage. Her first husband is deceased secondary to lung cancer (he was a smoker). She notes that she is a [place] and goes to church with her daughter. She was released from work on disability followed by retirement.  Social History   Social History  .  Marital Status: Married    Spouse Name: N/A  . Number of Children: N/A  . Years of Education: N/A   Social History Main Topics  . Smoking status: Former Smoker -- 1.00 packs/day for 30 years    Types: Cigarettes    Quit date: 01/18/1998  . Smokeless tobacco: Never Used  . Alcohol Use: No  . Drug Use: No  . Sexual Activity: Not Currently    Birth Control/ Protection: Surgical   Other Topics Concern  . None   Social History Narrative    PERFORMANCE STATUS: The patient's performance status is 3 - Symptomatic, >50% confined to bed  PHYSICAL EXAM: Most Recent Vital Signs: Blood pressure 115/71, pulse 61, temperature 99.2 F (37.3 C), temperature source Oral, resp. rate 16, height 5' 5.5" (1.664 m), weight 156 lb (70.761 kg), SpO2 93 %. General appearance: alert, cooperative, appears older than stated age, fatigued, no distress, pale and accompanied by her daughter, Estill Bamberg. Head: Normocephalic, without obvious abnormality, atraumatic Eyes: negative findings: lids and lashes normal, conjunctivae and sclerae normal and corneas clear Throat: lips, mucosa, and tongue normal; teeth and gums normal Neck: no adenopathy, supple, symmetrical, trachea midline and thyroid not enlarged, symmetric, no tenderness/mass/nodules Lungs: clear to auscultation bilaterally and normal percussion bilaterally Heart: regular rate and rhythm Abdomen: soft, non-tender; bowel sounds normal; no masses,  no organomegaly Extremities: edema B/L ankle non-pitting edema. Skin: Skin color, texture, turgor normal. No rashes or lesions Neurologic: Grossly normal  LABORATORY DATA:  CBC    Component Value Date/Time   WBC 7.0 04/25/2015 1417   RBC 4.61  04/25/2015 1417   RBC 4.61 04/25/2015 1417   HGB 11.1* 04/25/2015 1417   HCT 36.2 04/25/2015 1417   PLT 214 04/25/2015 1417   MCV 78.5 04/25/2015 1417   MCH 24.1* 04/25/2015 1417   MCHC 30.7 04/25/2015 1417   RDW 23.2* 04/25/2015 1417   LYMPHSABS 2.9 04/25/2015 1417   MONOABS 0.5 04/25/2015 1417   EOSABS 0.2 04/25/2015 1417   BASOSABS 0.1 04/25/2015 1417      Chemistry      Component Value Date/Time   NA 137 11/12/2014 1005   K 4.7 11/12/2014 1005   CL 101 11/12/2014 1005   CO2 26 11/12/2014 1005   BUN 17 11/12/2014 1005   CREATININE 0.99 11/12/2014 1005      Component Value Date/Time   CALCIUM 9.4 11/12/2014 1005   ALKPHOS 93 05/09/2012 1541   AST 21 05/09/2012 1541   ALT 11 05/09/2012 1541   BILITOT 0.5 05/09/2012 1541     Lab Results  Component Value Date   IRON 40 04/22/2015   TIBC 437 04/22/2015   FERRITIN 10* 04/22/2015   Lab Results  Component Value Date   FOLATE 7.8 04/22/2015   Lab Results  Component Value Date   [id number] 04/22/2015    RADIOGRAPHY: No results found.     PATHOLOGY:    Diagnosis Stomach, biopsy, gastric ulcer BENIGN SMALL BOWEL MUCOSA NEGATIVE FOR DYSPLASIA Microscopic Comment No Helicobacter pylori organism are identified (Warthin-Starry stain). Casimer Lanius MD Pathologist, Electronic Signature (Case signed 11/15/2014)  ASSESSMENT/PLAN:   Anemia, iron deficiency Iron malabsorption GI related blood loss Oral iron intolerance  Iron deficiency anemia secondary to chronic GI blood loss and presumed malabsorption.  EGD/Colonoscopy by Dr. Gala Romney on 11/14/2014 was unimpressive except for an anastomic ulcer, S/P gastric surgery. Intolerant to oral iron replacement resulting in nausea, vomiting, abdominal pain, and exacerbation of chronic constipation.  Problem list is updated  with resolution/deletion of some issues that are no longer active, duplicates, or have been resolved.  I personally reviewed and went over laboratory  results with the patient.  The results are noted within this dictation.  Calculated iron deficit based upon labs in April 2017 is calculated at 471 mg.  Order placed for IV Feraheme 510 mg next week based upon above mentioned iron deficit calculation with the development of a supportive therapy plan.  Labs the day of IV iron infusion: CBC diff, iron/TIBC, ferritin, serum copper.  Labs in ~ 6 weeks: CBC diff, iron/TIBC, ferritin.  Return in 6 weeks for follow-up.  B12 deficiency B12 deficiency.  On IM B12 replacement managed by primary care provider, Dr. Karie Kirks.    ORDERS PLACED FOR THIS ENCOUNTER: Orders Placed This Encounter  Procedures  . CBC with Differential  . Iron and TIBC  . Ferritin  . Copper, serum  . Basic metabolic panel  . CBC with Differential  . Iron and TIBC  . Ferritin    MEDICATIONS PRESCRIBED THIS ENCOUNTER: No orders of the defined types were placed in this encounter.    All questions were answered. The patient knows to call the clinic with any problems, questions or concerns. We can certainly see the patient much sooner if necessary.  This note is electronically signed by: Molli Hazard, MD   06/09/2015 1:36 PM

## 2015-06-12 ENCOUNTER — Encounter (HOSPITAL_COMMUNITY): Payer: Medicare Other

## 2015-06-12 ENCOUNTER — Encounter (HOSPITAL_BASED_OUTPATIENT_CLINIC_OR_DEPARTMENT_OTHER): Payer: Medicare Other

## 2015-06-12 ENCOUNTER — Encounter (HOSPITAL_COMMUNITY): Payer: Self-pay

## 2015-06-12 VITALS — BP 158/86 | HR 65 | Temp 99.2°F | Resp 18

## 2015-06-12 DIAGNOSIS — D509 Iron deficiency anemia, unspecified: Secondary | ICD-10-CM

## 2015-06-12 DIAGNOSIS — Z87891 Personal history of nicotine dependence: Secondary | ICD-10-CM | POA: Diagnosis not present

## 2015-06-12 DIAGNOSIS — Z808 Family history of malignant neoplasm of other organs or systems: Secondary | ICD-10-CM | POA: Diagnosis not present

## 2015-06-12 DIAGNOSIS — K922 Gastrointestinal hemorrhage, unspecified: Secondary | ICD-10-CM | POA: Diagnosis not present

## 2015-06-12 DIAGNOSIS — Z9884 Bariatric surgery status: Secondary | ICD-10-CM

## 2015-06-12 DIAGNOSIS — Z79899 Other long term (current) drug therapy: Secondary | ICD-10-CM | POA: Diagnosis not present

## 2015-06-12 DIAGNOSIS — Z9049 Acquired absence of other specified parts of digestive tract: Secondary | ICD-10-CM | POA: Diagnosis not present

## 2015-06-12 DIAGNOSIS — Z9889 Other specified postprocedural states: Secondary | ICD-10-CM | POA: Diagnosis not present

## 2015-06-12 DIAGNOSIS — D508 Other iron deficiency anemias: Secondary | ICD-10-CM | POA: Diagnosis not present

## 2015-06-12 DIAGNOSIS — K909 Intestinal malabsorption, unspecified: Secondary | ICD-10-CM | POA: Diagnosis not present

## 2015-06-12 DIAGNOSIS — E079 Disorder of thyroid, unspecified: Secondary | ICD-10-CM | POA: Diagnosis not present

## 2015-06-12 DIAGNOSIS — Z818 Family history of other mental and behavioral disorders: Secondary | ICD-10-CM | POA: Diagnosis not present

## 2015-06-12 LAB — CBC WITH DIFFERENTIAL/PLATELET
Basophils Absolute: 0.1 10*3/uL (ref 0.0–0.1)
Basophils Relative: 1 %
EOS ABS: 0.1 10*3/uL (ref 0.0–0.7)
Eosinophils Relative: 1 %
HCT: 40.9 % (ref 36.0–46.0)
HEMOGLOBIN: 12.9 g/dL (ref 12.0–15.0)
LYMPHS ABS: 2 10*3/uL (ref 0.7–4.0)
Lymphocytes Relative: 31 %
MCH: 25.2 pg — AB (ref 26.0–34.0)
MCHC: 31.5 g/dL (ref 30.0–36.0)
MCV: 79.9 fL (ref 78.0–100.0)
MONOS PCT: 6 %
Monocytes Absolute: 0.4 10*3/uL (ref 0.1–1.0)
NEUTROS PCT: 61 %
Neutro Abs: 3.8 10*3/uL (ref 1.7–7.7)
Platelets: 111 10*3/uL — ABNORMAL LOW (ref 150–400)
RBC: 5.12 MIL/uL — AB (ref 3.87–5.11)
RDW: 20.6 % — ABNORMAL HIGH (ref 11.5–15.5)
WBC: 6.3 10*3/uL (ref 4.0–10.5)

## 2015-06-12 LAB — BASIC METABOLIC PANEL
ANION GAP: 5 (ref 5–15)
BUN: 12 mg/dL (ref 6–20)
CALCIUM: 9.5 mg/dL (ref 8.9–10.3)
CO2: 28 mmol/L (ref 22–32)
Chloride: 105 mmol/L (ref 101–111)
Creatinine, Ser: 0.79 mg/dL (ref 0.44–1.00)
GLUCOSE: 201 mg/dL — AB (ref 65–99)
POTASSIUM: 4 mmol/L (ref 3.5–5.1)
SODIUM: 138 mmol/L (ref 135–145)

## 2015-06-12 LAB — IRON AND TIBC
IRON: 35 ug/dL (ref 28–170)
Saturation Ratios: 7 % — ABNORMAL LOW (ref 10.4–31.8)
TIBC: 470 ug/dL — ABNORMAL HIGH (ref 250–450)
UIBC: 435 ug/dL

## 2015-06-12 LAB — FERRITIN: FERRITIN: 6 ng/mL — AB (ref 11–307)

## 2015-06-12 MED ORDER — SODIUM CHLORIDE 0.9 % IV SOLN
Freq: Once | INTRAVENOUS | Status: AC
  Administered 2015-06-12: 13:00:00 via INTRAVENOUS

## 2015-06-12 MED ORDER — SODIUM CHLORIDE 0.9 % IV SOLN
510.0000 mg | Freq: Once | INTRAVENOUS | Status: AC
  Administered 2015-06-12: 510 mg via INTRAVENOUS
  Filled 2015-06-12: qty 17

## 2015-06-12 NOTE — Patient Instructions (Signed)
[place] at [facility] Discharge Instructions  RECOMMENDATIONS MADE BY THE CONSULTANT AND ANY TEST RESULTS WILL BE SENT TO YOUR REFERRING PHYSICIAN.  Iron infusion today. Return as scheduled for lab work and office visit. Call the clinic should you have any questions or concerns.   Thank you for choosing [place] at [facility] to provide your oncology and hematology care.  To afford each patient quality time with our provider, please arrive at least 15 minutes before your scheduled appointment time.   Beginning January 23rd 2017 lab work for the [facility] will be done in the  Main lab at [facility] on 1st floor. If you have a lab appointment with the [place] please come in thru the  Main Entrance and check in at the main information desk  You need to re-schedule your appointment should you arrive 10 or more minutes late.  We strive to give you quality time with our providers, and arriving late affects you and other patients whose appointments are after yours.  Also, if you no show three or more times for appointments you may be dismissed from the clinic at the providers discretion.     Again, thank you for choosing [facility].  Our hope is that these requests will decrease the amount of time that you wait before being seen by our physicians.       _____________________________________________________________  Should you have questions after your visit to [facility], please contact our office at [phone] between the hours of 8:30 a.m. and 4:30 p.m.  Voicemails left after 4:30 p.m. will not be returned until the following business day.  For prescription refill requests, have your pharmacy contact our office.         Resources For Cancer Patients and their Caregivers ? American Cancer Society: Can assist with transportation, wigs, general needs, runs Look Good Feel Better.         215-843-0517 ? Cancer Care: Provides financial assistance, online support groups, medication/co-pay assistance.  1-800-813-HOPE 2622567627) ? [place] Assists [place] cancer patients and their families through emotional , educational and financial support.  (854) 534-0897 ? [place] DSS Where to apply for food stamps, Medicaid and utility assistance. 507-380-4648 ? [facility]: Transportation to medical appointments. (845) 378-0968 ? Social Security Administration: May apply for disability if have a Stage IV cancer. 831-049-4646 (817)535-1247 ? [employer], Disability and Transit Services: Assists with nutrition, care and transit needs. [place] Support Programs: @10RELATIVEDAYS @ > Cancer Support Group  2nd Tuesday of the month 1pm-2pm, Journey Room  > Creative Journey  3rd Tuesday of the month 1130am-1pm, Journey Room  > Look Good Feel Better  1st Wednesday of the month 10am-12 noon, Journey Room (Call American Cancer Society to register (863)767-0080)  Ferumoxytol injection What is this medicine? FERUMOXYTOL is an iron complex. Iron is used to make healthy red blood cells, which carry oxygen and nutrients throughout the body. This medicine is used to treat iron deficiency anemia in people with chronic kidney disease. This medicine may be used for other purposes; ask your health care provider or pharmacist if you have questions. What should I tell my health care provider before I take this medicine? They need to know if you have any of these conditions: -anemia not caused by low iron levels -high levels of iron in the blood -magnetic resonance imaging (MRI) test scheduled -an unusual or allergic  reaction to iron, other medicines, foods, dyes, or preservatives -pregnant or trying to get pregnant -breast-feeding How should I use this medicine? This medicine is for injection into a vein. It is given by a health care professional  in a hospital or clinic setting. Talk to your pediatrician regarding the use of this medicine in children. Special care may be needed. Overdosage: If you think you have taken too much of this medicine contact a poison control center or emergency room at once. NOTE: This medicine is only for you. Do not share this medicine with others. What if I miss a dose? It is important not to miss your dose. Call your doctor or health care professional if you are unable to keep an appointment. What may interact with this medicine? This medicine may interact with the following medications: -other iron products This list may not describe all possible interactions. Give your health care provider a list of all the medicines, herbs, non-prescription drugs, or dietary supplements you use. Also tell them if you smoke, drink alcohol, or use illegal drugs. Some items may interact with your medicine. What should I watch for while using this medicine? Visit your doctor or healthcare professional regularly. Tell your doctor or healthcare professional if your symptoms do not start to get better or if they get worse. You may need blood work done while you are taking this medicine. You may need to follow a special diet. Talk to your doctor. Foods that contain iron include: whole grains/cereals, dried fruits, beans, or peas, leafy green vegetables, and organ meats (liver, kidney). What side effects may I notice from receiving this medicine? Side effects that you should report to your doctor or health care professional as soon as possible: -allergic reactions like skin rash, itching or hives, swelling of the face, lips, or tongue -breathing problems -changes in blood pressure -feeling faint or lightheaded, falls -fever or chills -flushing, sweating, or hot feelings -swelling of the ankles or feet Side effects that usually do not require medical attention (Report these to your doctor or health care professional if they  continue or are bothersome.): -diarrhea -headache -nausea, vomiting -stomach pain This list may not describe all possible side effects. Call your doctor for medical advice about side effects. You may report side effects to FDA at 1-800-FDA-1088. Where should I keep my medicine? This drug is given in a hospital or clinic and will not be stored at home. NOTE: This sheet is a summary. It may not cover all possible information. If you have questions about this medicine, talk to your doctor, pharmacist, or health care provider.    2016, Elsevier/Gold Standard. (2011-08-20 15:23:36)

## 2015-06-12 NOTE — Progress Notes (Signed)
1400:  Tolerated infusion w/o adverse reaction.  VSS.  Discharged ambulatory.

## 2015-06-14 LAB — COPPER, SERUM: COPPER: 55 ug/dL — AB (ref 72–166)

## 2015-06-17 ENCOUNTER — Encounter (HOSPITAL_COMMUNITY): Payer: Self-pay | Admitting: Oncology

## 2015-06-17 ENCOUNTER — Other Ambulatory Visit (HOSPITAL_COMMUNITY): Payer: Self-pay | Admitting: Oncology

## 2015-06-17 DIAGNOSIS — E610 Copper deficiency: Secondary | ICD-10-CM | POA: Insufficient documentation

## 2015-06-17 MED ORDER — COPPER GLUCONATE 2 MG PO CAPS: 2.0000 mg | ORAL_CAPSULE | Freq: Every day | ORAL | Status: DC

## 2015-06-23 ENCOUNTER — Encounter (HOSPITAL_COMMUNITY): Payer: Self-pay | Admitting: *Deleted

## 2015-07-23 ENCOUNTER — Ambulatory Visit (HOSPITAL_COMMUNITY): Payer: Self-pay | Admitting: Hematology & Oncology

## 2015-07-23 ENCOUNTER — Other Ambulatory Visit (HOSPITAL_COMMUNITY): Payer: Self-pay

## 2015-07-23 DIAGNOSIS — E6609 Other obesity due to excess calories: Secondary | ICD-10-CM | POA: Diagnosis not present

## 2015-07-23 DIAGNOSIS — D509 Iron deficiency anemia, unspecified: Secondary | ICD-10-CM | POA: Diagnosis not present

## 2015-07-23 DIAGNOSIS — R196 Halitosis: Secondary | ICD-10-CM | POA: Diagnosis not present

## 2015-07-23 DIAGNOSIS — Z9884 Bariatric surgery status: Secondary | ICD-10-CM | POA: Diagnosis not present

## 2015-07-27 NOTE — Progress Notes (Signed)
This encounter was created in error - please disregard.

## 2015-07-28 DIAGNOSIS — Z13228 Encounter for screening for other metabolic disorders: Secondary | ICD-10-CM | POA: Diagnosis not present

## 2015-07-28 DIAGNOSIS — E538 Deficiency of other specified B group vitamins: Secondary | ICD-10-CM | POA: Diagnosis not present

## 2015-07-28 DIAGNOSIS — E559 Vitamin D deficiency, unspecified: Secondary | ICD-10-CM | POA: Diagnosis not present

## 2015-07-28 DIAGNOSIS — R799 Abnormal finding of blood chemistry, unspecified: Secondary | ICD-10-CM | POA: Diagnosis not present

## 2015-07-28 DIAGNOSIS — R634 Abnormal weight loss: Secondary | ICD-10-CM | POA: Diagnosis not present

## 2015-07-28 DIAGNOSIS — E6609 Other obesity due to excess calories: Secondary | ICD-10-CM | POA: Diagnosis not present

## 2015-09-23 ENCOUNTER — Telehealth: Payer: Self-pay | Admitting: Gastroenterology

## 2015-09-23 ENCOUNTER — Encounter: Payer: Self-pay | Admitting: Gastroenterology

## 2015-09-23 ENCOUNTER — Ambulatory Visit: Payer: Self-pay | Admitting: Gastroenterology

## 2015-09-23 NOTE — Telephone Encounter (Signed)
PT WAS A NO SHOW AND LETTER SENT  °

## 2015-10-21 DIAGNOSIS — Z23 Encounter for immunization: Secondary | ICD-10-CM | POA: Diagnosis not present

## 2015-10-21 DIAGNOSIS — Z9884 Bariatric surgery status: Secondary | ICD-10-CM | POA: Diagnosis not present

## 2015-10-21 DIAGNOSIS — I10 Essential (primary) hypertension: Secondary | ICD-10-CM | POA: Diagnosis not present

## 2015-10-21 DIAGNOSIS — R634 Abnormal weight loss: Secondary | ICD-10-CM | POA: Diagnosis not present

## 2015-10-21 DIAGNOSIS — R799 Abnormal finding of blood chemistry, unspecified: Secondary | ICD-10-CM | POA: Diagnosis not present

## 2015-10-21 DIAGNOSIS — E559 Vitamin D deficiency, unspecified: Secondary | ICD-10-CM | POA: Diagnosis not present

## 2015-10-21 DIAGNOSIS — E538 Deficiency of other specified B group vitamins: Secondary | ICD-10-CM | POA: Diagnosis not present

## 2015-10-21 DIAGNOSIS — E6609 Other obesity due to excess calories: Secondary | ICD-10-CM | POA: Diagnosis not present

## 2016-01-21 ENCOUNTER — Other Ambulatory Visit (HOSPITAL_COMMUNITY): Payer: Self-pay | Admitting: Family Medicine

## 2016-01-21 DIAGNOSIS — R52 Pain, unspecified: Secondary | ICD-10-CM

## 2016-01-21 DIAGNOSIS — M79602 Pain in left arm: Secondary | ICD-10-CM

## 2016-01-27 ENCOUNTER — Ambulatory Visit (HOSPITAL_COMMUNITY)
Admission: RE | Admit: 2016-01-27 | Discharge: 2016-01-27 | Disposition: A | Discharge status: Home / Self Care | Payer: Medicare Other | Source: Ambulatory Visit | Attending: Family Medicine | Admitting: Family Medicine

## 2016-01-27 DIAGNOSIS — M79622 Pain in left upper arm: Secondary | ICD-10-CM | POA: Insufficient documentation

## 2016-01-27 DIAGNOSIS — R52 Pain, unspecified: Secondary | ICD-10-CM

## 2016-01-27 DIAGNOSIS — M79602 Pain in left arm: Secondary | ICD-10-CM

## 2016-01-27 DIAGNOSIS — Z9889 Other specified postprocedural states: Secondary | ICD-10-CM | POA: Diagnosis not present

## 2016-03-18 ENCOUNTER — Ambulatory Visit (INDEPENDENT_AMBULATORY_CARE_PROVIDER_SITE_OTHER): Payer: Medicare Other | Admitting: Orthopaedic Surgery

## 2016-03-18 ENCOUNTER — Encounter (INDEPENDENT_AMBULATORY_CARE_PROVIDER_SITE_OTHER): Payer: Self-pay | Admitting: Orthopaedic Surgery

## 2016-03-18 VITALS — BP 130/81 | HR 58 | Ht 65.0 in | Wt 150.0 lb

## 2016-03-18 DIAGNOSIS — M7542 Impingement syndrome of left shoulder: Secondary | ICD-10-CM | POA: Diagnosis not present

## 2016-03-18 NOTE — Progress Notes (Signed)
Office Visit Note   Patient: [name]           Date of Birth: Nov 11, 1945           MRN: [id number] Visit Date: 03/18/2016              Requested by: Lemmie Evens, MD [place], [place] [address] PCP: Robert Bellow, MD   Assessment & Plan: Visit Diagnoses:  1. Impingement syndrome of left shoulder     Plan: Left subacromial injection performed with improvement in her pain greater than 50%. She'll return if she has persistent symptoms.  Follow-Up Instructions: Return if symptoms worsen or fail to improve.   Orders:  Orders Placed This Encounter  Procedures  . Large Joint Injection/Arthrocentesis   No orders of the defined types were placed in this encounter.     Procedures: Large Joint Inj Date/Time: 03/18/2016 2:55 PM Performed by: Marybelle Killings Authorized by: Rodell Perna C   Consent Given by:  Patient Indications:  Pain Location:  Shoulder Site:  L subacromial bursa Needle Size:  22 G Needle Length:  1.5 inches Ultrasound Guidance: No   Fluoroscopic Guidance: No   Arthrogram: No   Medications:  4 mL bupivacaine 0.25 %; 1 mL lidocaine 1 %; 40 mg methylPREDNISolone acetate 40 MG/ML Aspiration Attempted: No   Patient tolerance:  Patient tolerated the procedure well with no immediate complications      Clinical Data: No additional findings.   Subjective: Chief Complaint  Patient presents with  . Left Arm - Pain    Patient presents with complaint of left arm pain. She has a history of cervical fusion with Dr. Arnoldo Morale. She states she has had this pain x 1 month. She is taking hydrocodone, xanax, and using heat.   Review of Systems 14 point review of systems performed. Cervical spondylosis, spondylolisthesis lumbar, hiatal hernia, insomnia.  Positive for hypothyroidism positive for kidney stones. GERD.   Objective: Vital Signs: BP 130/81   Pulse (!) 58   Ht 5\' 5"  (1.651 m)   Wt 150 lb (68 kg)   BMI 24.96 kg/m   Physical Exam   Constitutional: She is oriented to person, place, and time. She appears well-developed.  HENT:  Head: Normocephalic.  Right Ear: External ear normal.  Left Ear: External ear normal.  Eyes: Pupils are equal, round, and reactive to light.  Neck: No tracheal deviation present. No thyromegaly present.  Cardiovascular: Normal rate.   Pulmonary/Chest: Effort normal.  Abdominal: Soft.  Musculoskeletal:  Well-healed cervical incision. Good cervical range of motion. Positive impingement left shoulder. Negative drop arm test negative empty can test. Negative urinalysis and test. Negative subluxation of the shoulder. Internal/external rotators are strong. Elbow reaches full extension. Upper summary reflexes are 2+ and symmetrical. Sensory testing of the hand is normal.  Neurological: She is alert and oriented to person, place, and time.  Skin: Skin is warm and dry.  Psychiatric: She has a normal mood and affect. Her behavior is normal.    Ortho Exam negative impingement right shoulder. Negative subluxation right left shoulder. Before meals joint is normal. Normal heel-to-toe gait.   Specialty Comments:  No specialty comments available.  Imaging: No results found.   PMFS History: Patient Active Problem List   Diagnosis Date Noted  . Copper deficiency 06/17/2015  . Gastric ulceration   . Hiatal hernia   . Anemia, iron deficiency   . Spondylolisthesis of lumbar region 01/01/2013  . Palpitations 05/08/2012  .  Insomnia due to mental disorder 12/03/2011  . Benzodiazepine causing adverse effect in therapeutic use 12/03/2011  . right upper urethral calculus with obstructionh 04/08/2010  . HYPOTHYROIDISM 03/26/2010  . Anxiety state, unspecified 03/26/2010  . Bariatric surgery status 03/26/2010  . Hydronephrosis, right 01/27/2010  . HEMOCCULT POSITIVE STOOL 08/11/2009  . ABDOMINAL PAIN, UNSPECIFIED SITE 05/20/2009  . BENIGN NEOPLASM OF PARATHYROID GLAND 03/19/2009  . B12 deficiency  03/19/2009  . HYPERCHOLESTEROLEMIA 03/19/2009  . OBESITY, MORBID 03/19/2009  . DEPRESSION/ANXIETY 03/19/2009  . HYPERTENSION 03/19/2009  . GASTROESOPHAGEAL REFLUX DISEASE, CHRONIC 03/19/2009  . DEGENERATIVE JOINT DISEASE 03/19/2009   Past Medical History:  Diagnosis Date  . Anxiety   . Arthritis   . Blood transfusion    /w bleeding ulcer & post knee replacement- 2012  . Chronic kidney disease    h/o renal calculi, h/o lithotripsy  . Copper deficiency 06/17/2015  . Depression   . GERD (gastroesophageal reflux disease)    h/o bleeding ulcer, hosp., [facility]  . Headache(784.0)   . Hiatal hernia   . Hypothyroidism   . Morbid obesity ([place])   . Neuralgia    spondylosis & stenosis  . Obsessive-compulsive disorder   . PONV (postoperative nausea and vomiting)   . Recurrent upper respiratory infection (URI)   . Shortness of breath    with exertion  . Sleep apnea    study- [facility], CPAP not used in 3 months, states she has a new machine  but hasn't used   . Thyroid disease     Family History  Problem Relation Age of Onset  . Anesthesia problems Neg Hx   . Hypotension Neg Hx   . Malignant hyperthermia Neg Hx   . Pseudochol deficiency Neg Hx   . Dementia Neg Hx   . Alcohol abuse Neg Hx   . Drug abuse Neg Hx   . Depression Neg Hx   . Paranoid behavior Neg Hx   . Schizophrenia Neg Hx   . Seizures Neg Hx   . Sexual abuse Neg Hx   . Physical abuse Neg Hx   . Colon cancer Neg Hx   . Anxiety disorder Mother   . Anxiety disorder Sister   . Bipolar disorder Daughter   . OCD Daughter   . ADD / ADHD Daughter   . Heart attack Father     Past Surgical History:  Procedure Laterality Date  . ABDOMINAL HYSTERECTOMY    . ANTERIOR CERVICAL DECOMP/DISCECTOMY FUSION  12/09/2010   Procedure: ANTERIOR CERVICAL DECOMPRESSION/DISCECTOMY FUSION 3 LEVELS;  Surgeon: Ophelia Charter;  Location: [facility];  Service: Neurosurgery;  Laterality: N/A;  Cervical three-four,Cervical  four-five,Cervical Five-Six,Cervical Six-Seven ANTERIOR CERVICAL DECOMPRESSION WITH FUSION INTERBODY PROTHESIS PLATING AND BONEGRAFT  . APPENDECTOMY    . BIOPSY N/A 11/14/2014   Procedure: GASTRIC BIOPSY;  Surgeon: Daneil Dolin, MD;  Location: [facility];  Service: Endoscopy;  Laterality: N/A;  . CESAREAN SECTION    . CHOLECYSTECTOMY    . COLONOSCOPY WITH PROPOFOL N/A 11/14/2014   RMR: redundant colon but otherwise normal  . ESOPHAGOGASTRODUODENOSCOPY (EGD) WITH PROPOFOL N/A 11/14/2014   RMR"s/p gastric surgery/anastomtic ulcer  . EYE SURGERY     bilateral cataracts removed, w/IOL  . GASTRIC BYPASS    . JOINT REPLACEMENT     07/2010 &  2002- respectively- both knees   . PARATHYROIDECTOMY    . PERIPHERALLY INSERTED CENTRAL CATHETER INSERTION     PICC line for treatment of MRSA  . TONSILLECTOMY  as a child   Social History   Occupational History  . Not on file.   Social History Main Topics  . Smoking status: Former Smoker    Packs/day: 1.00    Years: 30.00    Types: Cigarettes    Quit date: 01/18/1998  . Smokeless tobacco: Never Used  . Alcohol use No  . Drug use: No  . Sexual activity: Not Currently    Birth control/ protection: Surgical

## 2016-03-28 MED ORDER — METHYLPREDNISOLONE ACETATE 40 MG/ML IJ SUSP
40.0000 mg | INTRAMUSCULAR | Status: AC | PRN
  Administered 2016-03-18: 40 mg via INTRA_ARTICULAR

## 2016-03-28 MED ORDER — BUPIVACAINE HCL 0.25 % IJ SOLN
4.0000 mL | INTRAMUSCULAR | Status: AC | PRN
  Administered 2016-03-18: 4 mL via INTRA_ARTICULAR

## 2016-03-28 MED ORDER — LIDOCAINE HCL 1 % IJ SOLN
1.0000 mL | INTRAMUSCULAR | Status: AC | PRN
  Administered 2016-03-18: 1 mL

## 2016-06-01 ENCOUNTER — Other Ambulatory Visit (HOSPITAL_COMMUNITY): Payer: Self-pay | Admitting: Specialist

## 2016-06-01 DIAGNOSIS — M7541 Impingement syndrome of right shoulder: Secondary | ICD-10-CM

## 2016-06-01 DIAGNOSIS — M7542 Impingement syndrome of left shoulder: Secondary | ICD-10-CM

## 2016-06-04 ENCOUNTER — Encounter (HOSPITAL_COMMUNITY): Payer: Self-pay

## 2016-06-04 ENCOUNTER — Ambulatory Visit (HOSPITAL_COMMUNITY): Payer: Medicare Other

## 2016-06-04 ENCOUNTER — Ambulatory Visit (HOSPITAL_COMMUNITY)
Admission: RE | Admit: 2016-06-04 | Discharge: 2016-06-04 | Disposition: A | Discharge status: Home / Self Care | Payer: Medicare Other | Source: Ambulatory Visit | Attending: Specialist | Admitting: Specialist

## 2016-06-15 ENCOUNTER — Ambulatory Visit (HOSPITAL_COMMUNITY)
Admission: RE | Admit: 2016-06-15 | Discharge: 2016-06-15 | Disposition: A | Discharge status: Home / Self Care | Payer: Medicare Other | Source: Ambulatory Visit | Attending: Specialist | Admitting: Specialist

## 2016-06-15 DIAGNOSIS — M19012 Primary osteoarthritis, left shoulder: Secondary | ICD-10-CM | POA: Diagnosis not present

## 2016-06-15 DIAGNOSIS — M7542 Impingement syndrome of left shoulder: Secondary | ICD-10-CM | POA: Diagnosis not present

## 2016-06-15 DIAGNOSIS — M7541 Impingement syndrome of right shoulder: Secondary | ICD-10-CM | POA: Insufficient documentation

## 2017-04-05 ENCOUNTER — Encounter: Payer: Self-pay | Admitting: Internal Medicine

## 2017-04-07 ENCOUNTER — Ambulatory Visit: Payer: Self-pay | Admitting: Gastroenterology

## 2017-04-12 ENCOUNTER — Ambulatory Visit: Payer: Medicare Other | Admitting: Gastroenterology

## 2017-06-08 ENCOUNTER — Encounter: Payer: Self-pay | Admitting: Gastroenterology

## 2017-06-08 ENCOUNTER — Other Ambulatory Visit: Payer: Self-pay | Admitting: *Deleted

## 2017-06-08 ENCOUNTER — Ambulatory Visit (INDEPENDENT_AMBULATORY_CARE_PROVIDER_SITE_OTHER): Payer: Medicare Other | Admitting: Gastroenterology

## 2017-06-08 VITALS — BP 192/100 | HR 61 | Temp 99.1°F | Ht 64.0 in | Wt 180.4 lb

## 2017-06-08 DIAGNOSIS — R1013 Epigastric pain: Secondary | ICD-10-CM | POA: Diagnosis not present

## 2017-06-08 DIAGNOSIS — R1310 Dysphagia, unspecified: Secondary | ICD-10-CM | POA: Diagnosis not present

## 2017-06-08 DIAGNOSIS — R1032 Left lower quadrant pain: Secondary | ICD-10-CM

## 2017-06-08 DIAGNOSIS — R1319 Other dysphagia: Secondary | ICD-10-CM

## 2017-06-08 DIAGNOSIS — K219 Gastro-esophageal reflux disease without esophagitis: Secondary | ICD-10-CM | POA: Diagnosis not present

## 2017-06-08 DIAGNOSIS — I10 Essential (primary) hypertension: Secondary | ICD-10-CM

## 2017-06-08 NOTE — Patient Instructions (Signed)
1. Please have your labs and CT scan done. We will call with results as available.  2. We will start a new medication to help with constipation once your CT scan is done.  3. Upper endoscopy to be scheduled to evaluate your abdominal pain and esophageal complaints.  4. Continue Nexium twice daily.

## 2017-06-08 NOTE — Progress Notes (Signed)
Ct abo

## 2017-06-08 NOTE — Progress Notes (Signed)
Primary Care Physician:  Lemmie Evens, MD  Primary Gastroenterologist:  Garfield Cornea, MD   Chief Complaint  Patient presents with  . Abdominal Pain    Ref By Dr. Karie Kirks, upper to lower pain. c/o ulcer  . Nausea    no vomiting     HPI:  [name] is a 72 y.o. female here for further evaluation of abdominal pain, nausea, GERD.  She was last seen in March 2017.  She has a history of gastric bypass in 2007.  History of iron deficiency anemia felt to be secondary to gastric bypass versus friable anastomosis.  Work-up as detailed below.  Patient complains of epigastric pain down both sides of her abdomen.  Feels like her esophagus is irritated, greasy like sand.  Some difficulty swallowing solid foods.  Abdominal pain present all the time but worse with food.  Nausea with gagging but does not throw up most the time.  If she vomits she vomits up white "snowballs" the size of a golf ball which are soft and mushy.  Couple weeks ago she was having constipation, now her stools are somewhat loose.  Most the time she has pretty regular bowel movements except for when she takes her pain medication which causes constipation.  She generally takes her pain medication all the time but she notes she is been out for 3 days and her stools have been loose again.  Denies melena or rectal bleeding.  Denies NSAIDs or aspirin.  She has gained 30 pounds in the past 1 year.  She believes her abdominal pain was brought on by not being able to get her name brand Nexium.  She was on generic for over 3 years.  She is been back on namebrand for at least a month, 40 mg twice daily but no improvement in her symptoms.  On exam today her blood pressure was elevated.  I do not have any records of recent blood pressures although a year ago and before she had a normal blood pressure.  She states she is having some pain, she is been dealing with a tooth abscess and recently had a tooth pulled.  She is not on any  antihypertensives.  Patient used to be seen at hematology clinic for iron infusions as needed.  Also history of B12 replacement via IM by PCP.  Hematology in 2017.   Pertinent GI history EGD/colonoscopy October 2016, 2 cm hiatal hernia, surgically altered stomach with 2 small bowel limbs, one with blind pouch and efferent limb intubated 10-15 cm. Anastomotic ulcer present.  Biopsy unremarkable.  Colonoscopy with redundant colon but otherwise unremarkable..   Remote GI work-up for IDA/heme positive stools in 2011.Upper endoscopy 06/05/2009 which showed healed gastric ulcer. Last diagnostic colonoscopy on 04/07/2009 which found redundant colon and 2 sigmoid polyps. Polyps on pathology showed hyperplastic polyp. Capsule endoscopy placed on 10/15/2009. Findings included small bowel loop dilated and a few erosions but nothing specific to explain her pain. Iron deficiency anemia and heme-positive stool likely secondary to gastric bypass versus friable anastomosis. Gastric bypass in 2007.    Current Outpatient Medications  Medication Sig Dispense Refill  . ALPRAZolam (XANAX) 1 MG tablet Take 1 mg by mouth 4 (four) times daily.     [name] amoxicillin (AMOXIL) 500 MG capsule Take 500 mg by mouth 4 (four) times daily.    . Cyanocobalamin (VITAMIN B-12 IJ) Inject 1 Applicatorful as directed every 30 (thirty) days.     [name] esomeprazole (NEXIUM) 40 MG capsule Take 40  mg by mouth 2 (two) times daily.     [name] HYDROcodone-acetaminophen (NORCO) 10-325 MG per tablet Take 1 tablet by mouth every 6 (six) hours as needed for moderate pain.     [name] levothyroxine (SYNTHROID, LEVOTHROID) 75 MCG tablet Take 75 mcg by mouth daily before breakfast.    . metoCLOPramide (REGLAN) 10 MG tablet Take 1 tablet (10 mg total) by mouth every 6 (six) hours as needed (nausea/headache). 20 tablet 0  . propranolol (INDERAL) 10 MG tablet Take 1 tablet (10 mg total) by mouth 3 (three) times daily. 90 tablet 1  . venlafaxine (EFFEXOR) 75 MG tablet  Take 75 mg by mouth daily.     No current facility-administered medications for this visit.     Allergies as of 06/08/2017 - Review Complete 06/08/2017  Allergen Reaction Noted  . Benzodiazepines Other (See Comments) 12/03/2011  . Codeine Nausea Only   . Escitalopram oxalate Nausea Only 12/02/2010  . Nsaids Other (See Comments) 12/22/2012  . Sertraline hcl Nausea Only   . Statins Nausea And Vomiting   . Tramadol hcl Other (See Comments) 12/22/2012    Past Medical History:  Diagnosis Date  . Anxiety   . Arthritis   . Blood transfusion    /w bleeding ulcer & post knee replacement- 2012  . Chronic kidney disease    h/o renal calculi, h/o lithotripsy  . Copper deficiency 06/17/2015  . Depression   . GERD (gastroesophageal reflux disease)    h/o bleeding ulcer, hosp., [facility]  . Headache(784.0)   . Hiatal hernia   . Hypothyroidism   . Morbid obesity ([place])   . Neuralgia    spondylosis & stenosis  . Obsessive-compulsive disorder   . PONV (postoperative nausea and vomiting)   . Recurrent upper respiratory infection (URI)   . Shortness of breath    with exertion  . Sleep apnea    study- [facility], CPAP not used in 3 months, states she has a new machine  but hasn't used   . Thyroid disease     Past Surgical History:  Procedure Laterality Date  . ABDOMINAL HYSTERECTOMY    . ANTERIOR CERVICAL DECOMP/DISCECTOMY FUSION  12/09/2010   Procedure: ANTERIOR CERVICAL DECOMPRESSION/DISCECTOMY FUSION 3 LEVELS;  Surgeon: Ophelia Charter;  Location: [facility];  Service: Neurosurgery;  Laterality: N/A;  Cervical three-four,Cervical four-five,Cervical Five-Six,Cervical Six-Seven ANTERIOR CERVICAL DECOMPRESSION WITH FUSION INTERBODY PROTHESIS PLATING AND BONEGRAFT  . APPENDECTOMY    . BIOPSY N/A 11/14/2014   Procedure: GASTRIC BIOPSY;  Surgeon: Daneil Dolin, MD;  Location: [facility];  Service: Endoscopy;  Laterality: N/A;  . CESAREAN SECTION    . CHOLECYSTECTOMY    .  COLONOSCOPY WITH PROPOFOL N/A 11/14/2014   RMR: redundant colon but otherwise normal  . ESOPHAGOGASTRODUODENOSCOPY (EGD) WITH PROPOFOL N/A 11/14/2014   RMR"s/p gastric surgery/anastomtic ulcer  . EYE SURGERY     bilateral cataracts removed, w/IOL  . GASTRIC BYPASS    . JOINT REPLACEMENT     07/2010 &  2002- respectively- both knees   . PARATHYROIDECTOMY    . PERIPHERALLY INSERTED CENTRAL CATHETER INSERTION     PICC line for treatment of MRSA  . TONSILLECTOMY     as a child    Family History  Problem Relation Age of Onset  . Anesthesia problems Neg Hx   . Hypotension Neg Hx   . Malignant hyperthermia Neg Hx   . Pseudochol deficiency Neg Hx   . Dementia Neg Hx   .  Alcohol abuse Neg Hx   . Drug abuse Neg Hx   . Depression Neg Hx   . Paranoid behavior Neg Hx   . Schizophrenia Neg Hx   . Seizures Neg Hx   . Sexual abuse Neg Hx   . Physical abuse Neg Hx   . Colon cancer Neg Hx   . Anxiety disorder Mother   . Anxiety disorder Sister   . Bipolar disorder Daughter   . OCD Daughter   . ADD / ADHD Daughter   . Heart attack Father     Social History   Socioeconomic History  . Marital status: Married    Spouse name: Not on file  . Number of children: Not on file  . Years of education: Not on file  . Highest education level: Not on file  Occupational History  . Not on file  Social Needs  . Financial resource strain: Not on file  . Food insecurity:    Worry: Not on file    Inability: Not on file  . Transportation needs:    Medical: Not on file    Non-medical: Not on file  Tobacco Use  . Smoking status: Former Smoker    Packs/day: 1.00    Years: 30.00    Pack years: 30.00    Types: Cigarettes    Last attempt to quit: 01/18/1998    Years since quitting: 19.4  . Smokeless tobacco: Never Used  Substance and Sexual Activity  . Alcohol use: No  . Drug use: No  . Sexual activity: Not Currently    Birth control/protection: Surgical  Lifestyle  . Physical activity:     Days per week: Not on file    Minutes per session: Not on file  . Stress: Not on file  Relationships  . Social connections:    Talks on phone: Not on file    Gets together: Not on file    Attends religious service: Not on file    Active member of club or organization: Not on file    Attends meetings of clubs or organizations: Not on file    Relationship status: Not on file  . Intimate partner violence:    Fear of current or ex partner: Not on file    Emotionally abused: Not on file    Physically abused: Not on file    Forced sexual activity: Not on file  Other Topics Concern  . Not on file  Social History Narrative  . Not on file      ROS:  General: Negative for anorexia, weight loss, fever, chills, fatigue, weakness. Eyes: Negative for vision changes.  ENT: Negative for hoarseness, difficulty swallowing , nasal congestion. CV: Negative for chest pain, angina, palpitations, dyspnea on exertion, peripheral edema.  Respiratory: Negative for dyspnea at rest, dyspnea on exertion, cough, sputum, wheezing.  GI: See history of present illness. GU:  Negative for dysuria, hematuria, urinary incontinence, urinary frequency, nocturnal urination.  MS: Positive chronic pain.  Derm: Negative for rash or itching.  Neuro: Negative for weakness, abnormal sensation, seizure, frequent headaches, memory loss, confusion.  Psych: Negative for anxiety, depression, suicidal ideation, hallucinations.  Endo: Negative for unusual weight change.  Heme: Negative for bruising or bleeding. Allergy: Negative for rash or hives.    Physical Examination:  BP (!) 192/100   Pulse 61   Temp 99.1 F (37.3 C) (Oral)   Ht 5\' 4"  (1.626 m)   Wt 180 lb 6.4 oz (81.8 kg)   BMI 30.97 kg/m  General: Well-nourished, well-developed in no acute distress.  Head: Normocephalic, atraumatic.   Eyes: Conjunctiva pink, no icterus. Mouth: Oropharyngeal mucosa moist and pink , no lesions erythema or exudate. Neck:  Supple without thyromegaly, masses, or lymphadenopathy.  Lungs: Clear to auscultation bilaterally.  Heart: Regular rate and rhythm, no murmurs rubs or gallops.  Abdomen: Bowel sounds are normal,  nondistended, no hepatosplenomegaly or masses, no abdominal bruits or    hernia , no rebound or guarding.  Moderate epigastric tenderness as well as left lower quadrant tenderness. Rectal: Not performed Extremities: No lower extremity edema. No clubbing or deformities.  Neuro: Alert and oriented x 4 , grossly normal neurologically.  Skin: Warm and dry, no rash or jaundice.   Psych: Alert and cooperative, normal mood and affect.  Labs: No recent labs available.  Imaging Studies: No results found.

## 2017-06-09 ENCOUNTER — Encounter: Payer: Self-pay | Admitting: Gastroenterology

## 2017-06-09 ENCOUNTER — Telehealth: Payer: Self-pay | Admitting: *Deleted

## 2017-06-09 ENCOUNTER — Telehealth: Payer: Self-pay | Admitting: Gastroenterology

## 2017-06-09 NOTE — Progress Notes (Signed)
cc'd to pcp 

## 2017-06-09 NOTE — Assessment & Plan Note (Signed)
Pretty significant left lower quadrant tenderness on exam today.  She also had epigastric discomfort.  Recommend CT abdomen pelvis for further evaluation, rule out diverticulitis as well as evaluate for complications from prior gastric bypass, internal herniation etc.

## 2017-06-09 NOTE — Telephone Encounter (Signed)
Noted. Thanks.

## 2017-06-09 NOTE — Assessment & Plan Note (Signed)
Complains of a done aphasia/solid food dysphasia.  Plan for EGD with dilation with deep sedation in the near future.  I have discussed the risks, alternatives, benefits with regards to but not limited to the risk of reaction to medication, bleeding, infection, perforation and the patient is agreeable to proceed. Written consent to be obtained.

## 2017-06-09 NOTE — Telephone Encounter (Signed)
Spoke with pt's daughter that brought pt to her appointment yesterday. She is going to have her mothers BP checked today at the drug store. If the numbers are still elevated, she agreed to call the pt's PCP. Pt's daughter already contacted pt's PCP yesterday inreference to some problems the pt is having.

## 2017-06-09 NOTE — Assessment & Plan Note (Signed)
Suspect anastomotic ulcer given prior history.  She is on twice daily PPI therapy without relief.  Plan for upper endoscopy with deep sedation in the near future.  I have discussed the risks, alternatives, benefits with regards to but not limited to the risk of reaction to medication, bleeding, infection, perforation and the patient is agreeable to proceed. Written consent to be obtained.  Update labs for anemia and other causes of abd pain.

## 2017-06-09 NOTE — Assessment & Plan Note (Signed)
Blood pressure elevated today, asymptomatic.  We will have her follow-up with her PCP.

## 2017-06-09 NOTE — Telephone Encounter (Addendum)
LMOVM to call back for patient daughter Donella Stade.  CT scheduled for 06/29/17 at 9:00am, arrival time 8:45am, npo 4 hours prior, p/u oral contrast from [facility] de[facility].  Also pt needs to be scheduled for EGD/ED w/ MAC W/ RMR

## 2017-06-09 NOTE — Assessment & Plan Note (Signed)
Typical heartburn controlled but describing complicating features.  Plan for EGD/ED as outlined.  Continue PPI twice daily.

## 2017-06-09 NOTE — Telephone Encounter (Signed)
Patient had an elevated blood pressure yesterday in the office.  Was supposed to be rechecked prior to her discharge but I do not see any documentation of that.  If patient has a means of rechecking her blood pressure at home, she can do that and if systolic pressure above 683 or diastolic pressure above 729 I would recommend her contacting her PCP.  If she does not have a means to recheck in her blood pressure at home, I would recommend her calling her PCP and informing him that her blood pressure was 192/100 yesterday.

## 2017-06-14 ENCOUNTER — Other Ambulatory Visit: Payer: Self-pay | Admitting: *Deleted

## 2017-06-14 DIAGNOSIS — R1310 Dysphagia, unspecified: Secondary | ICD-10-CM

## 2017-06-14 DIAGNOSIS — R1013 Epigastric pain: Secondary | ICD-10-CM

## 2017-06-14 DIAGNOSIS — K219 Gastro-esophageal reflux disease without esophagitis: Secondary | ICD-10-CM

## 2017-06-14 DIAGNOSIS — R1319 Other dysphagia: Secondary | ICD-10-CM

## 2017-06-14 NOTE — Telephone Encounter (Signed)
LMOVM

## 2017-06-14 NOTE — Telephone Encounter (Signed)
Spoke with patient daughter and is aware of CT appt details.   Procedure scheduled for 07/25/17 at 2:30pm. Prep instructions discussed with daughter and is aware will mail instructions (confirmed mailing address). Aware will call back with pre-op appt.

## 2017-06-15 NOTE — Telephone Encounter (Signed)
LMOVM. Pre-op scheduled for 07/19/17 at 9:00am. Letter mailed

## 2017-06-29 ENCOUNTER — Ambulatory Visit (HOSPITAL_COMMUNITY): Payer: Medicare Other

## 2017-07-10 IMAGING — DX DG CERVICAL SPINE COMPLETE 4+V
5 series · 5 of 5 positions shown · non-contrast
Comparison: CT scan of April 09, 2015.

CLINICAL DATA: Left-sided neck pain.

EXAM:
CERVICAL SPINE - COMPLETE 4+ VIEW

[c-spine lat]
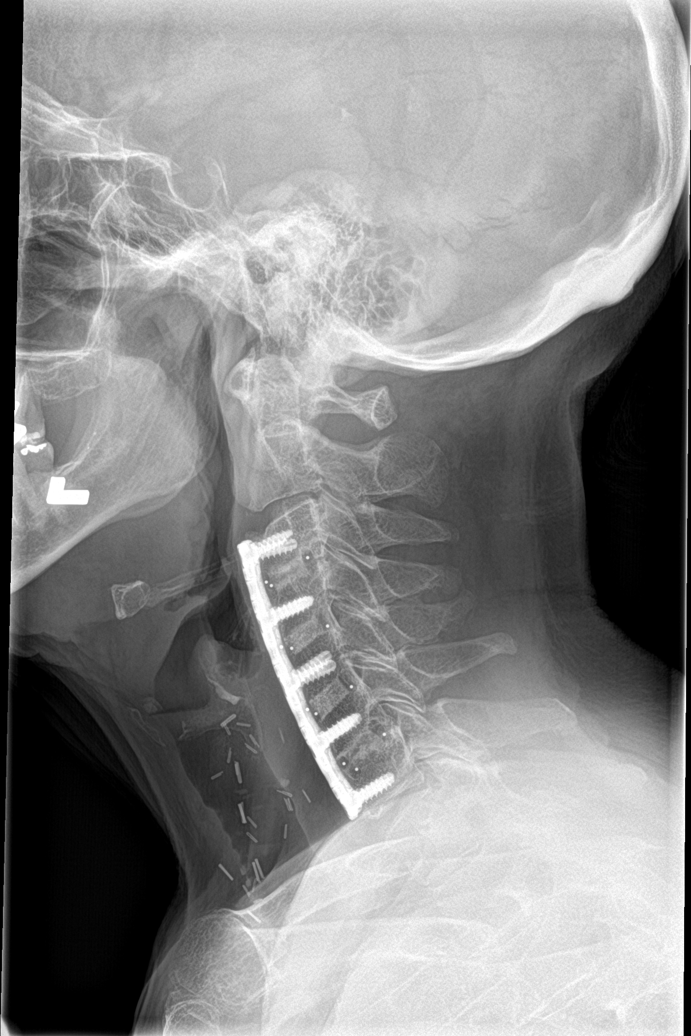

[c-spine obl (1 of 2)]
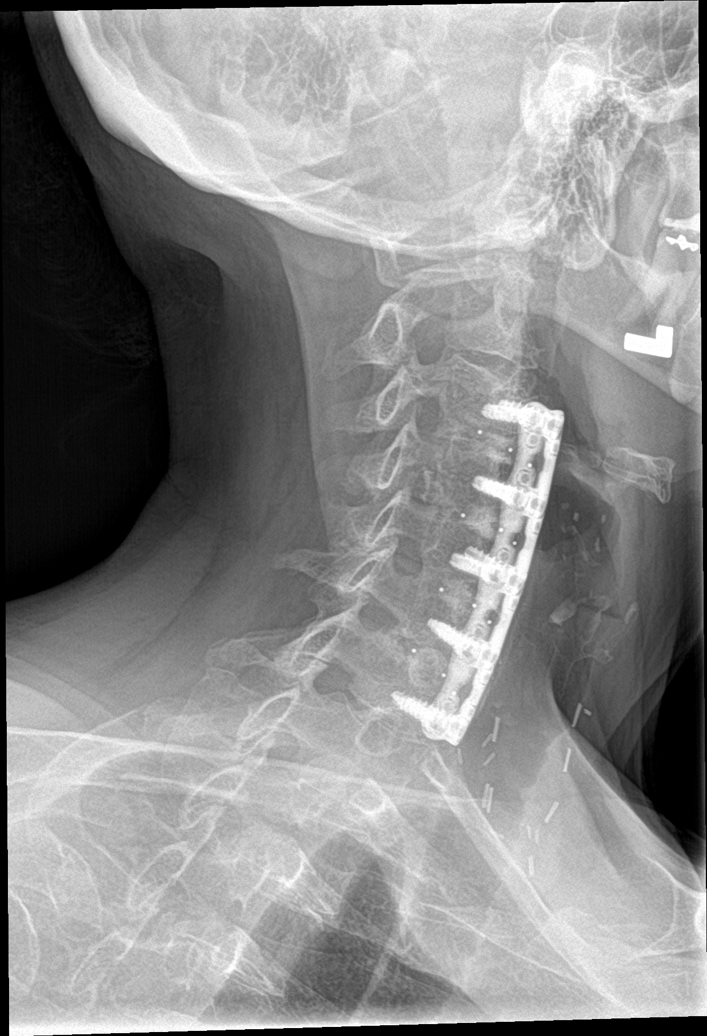

[c-spine obl (2 of 2)]
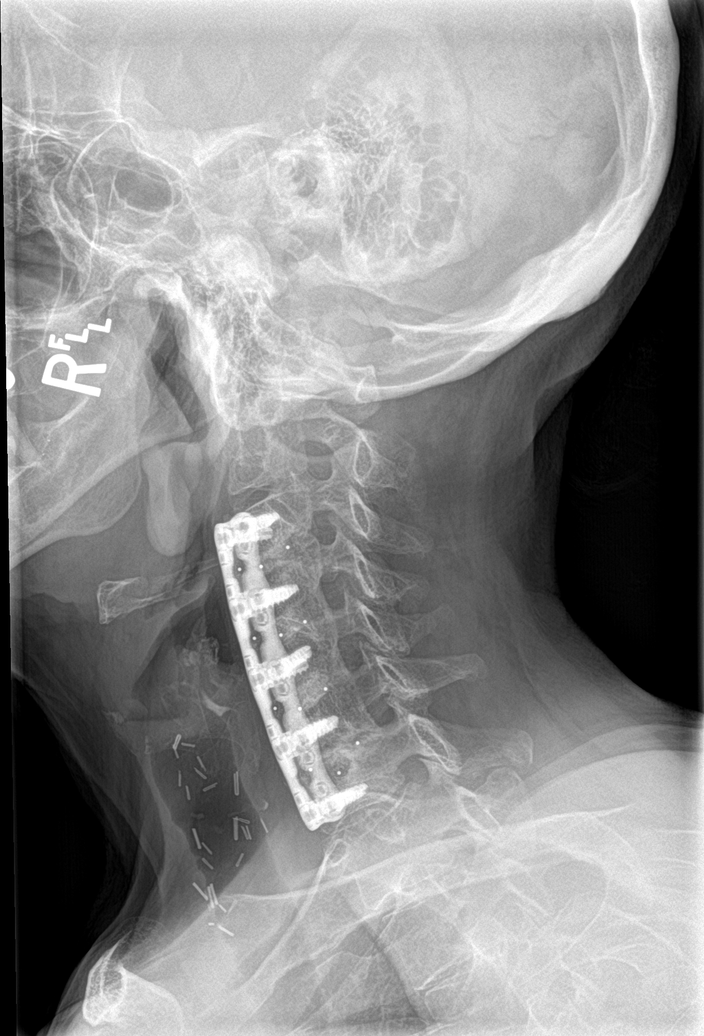

[c-spine ap]
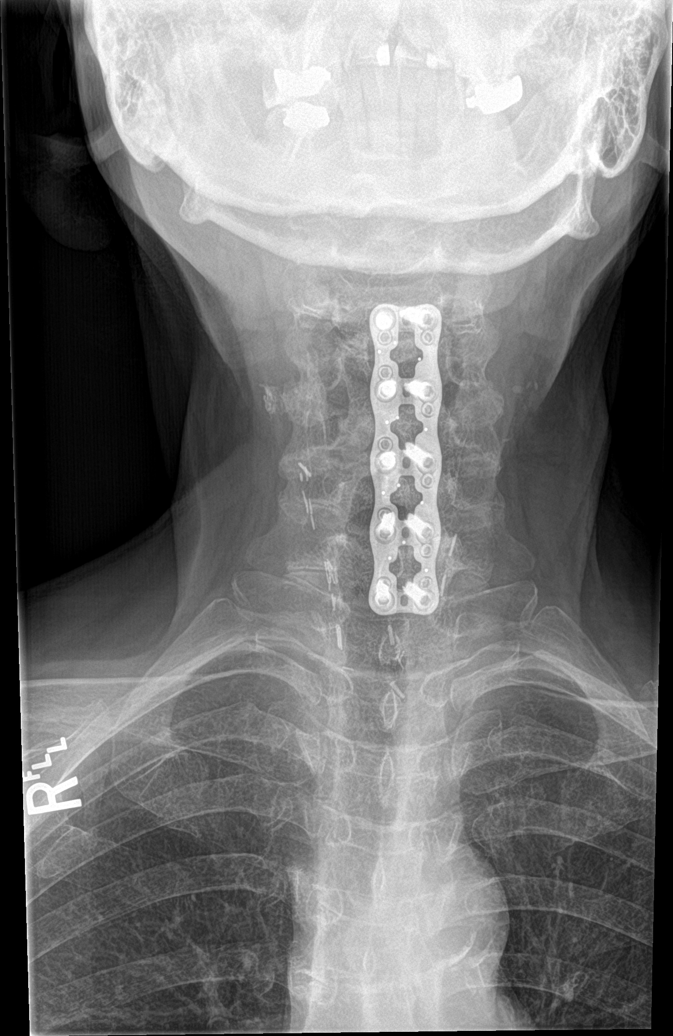

[c-spine swimmers trauma]
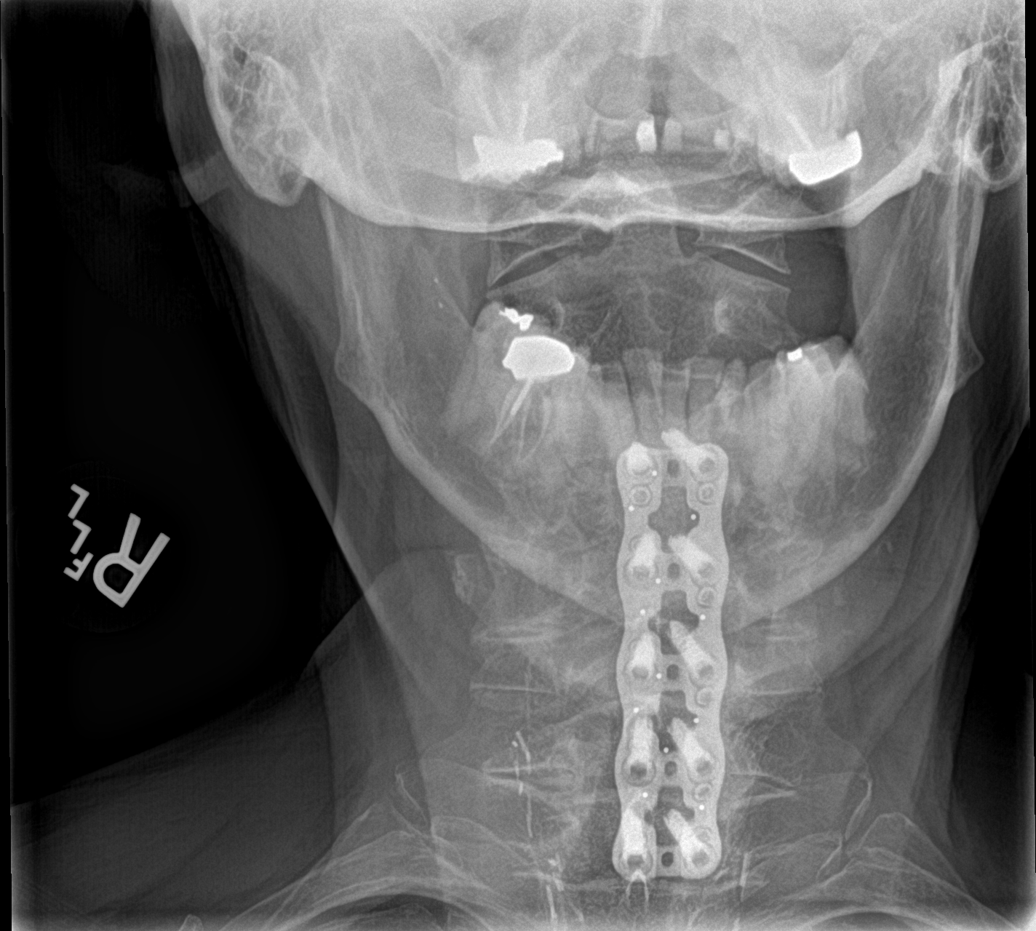

[5 of 5 positions shown; findings below may reference images not displayed]

FINDINGS: Status post surgical anterior fusion extending from C3-C7 with
interbody fusion. Good alignment of vertebral bodies is noted. No
fracture or spondylolisthesis is noted. No prevertebral soft tissue
swelling is noted. No significant neural foraminal stenosis is
noted.
IMPRESSION: Postsurgical changes as described above. No acute abnormality seen
in the cervical spine.

## 2017-07-15 NOTE — Patient Instructions (Signed)
[name]  07/15/2017     @PREFPERIOPPHARMACY @   Your procedure is scheduled on  07/25/2017 .  Report to [facility] at  1245  P.M.  Call this number if you have problems the morning of surgery:  [phone]   Remember:  Do not eat or drink after midnight.  You may drink clear liquids until (follow the instructions given to you) .  Clear liquids allowed are:                    Water, Juice (non-citric and without pulp), Carbonated beverages, Clear Tea, Black Coffee only, Plain Jell-O only, Gatorade and Plain Popsicles only    Take these medicines the morning of surgery with A SIP OF WATER Xanax, nexium, hydrocodone, levothyroxine, reglan, propranolol, effexor.    Do not wear jewelry, make-up or nail polish.  Do not wear lotions, powders, or perfumes, or deodorant.  Do not shave 48 hours prior to surgery.  Men may shave face and neck.  Do not bring valuables to the hospital.  [facility] is not responsible for any belongings or valuables.  Contacts, dentures or bridgework may not be worn into surgery.  Leave your suitcase in the car.  After surgery it may be brought to your room.  For patients admitted to the hospital, discharge time will be determined by your treatment team.  Patients discharged the day of surgery will not be allowed to drive home.   Name and phone number of your driver:   family Special instructions:  Follow the diet instructions given to you by Dr Roseanne Kaufman office.  Please read over the following fact sheets that you were given. Anesthesia Post-op Instructions and Care and Recovery After Surgery       Esophagogastroduodenoscopy Esophagogastroduodenoscopy (EGD) is a procedure to examine the lining of the esophagus, stomach, and first part of the small intestine (duodenum). This procedure is done to check for problems such as inflammation, bleeding, ulcers, or growths. During this procedure, a long, flexible, lighted tube with a camera  attached (endoscope) is inserted down the throat. Tell a health care provider about:  Any allergies you have.  All medicines you are taking, including vitamins, herbs, eye drops, creams, and over-the-counter medicines.  Any problems you or family members have had with anesthetic medicines.  Any blood disorders you have.  Any surgeries you have had.  Any medical conditions you have.  Whether you are pregnant or may be pregnant. What are the risks? Generally, this is a safe procedure. However, problems may occur, including:  Infection.  Bleeding.  A tear (perforation) in the esophagus, stomach, or duodenum.  Trouble breathing.  Excessive sweating.  Spasms of the larynx.  A slowed heartbeat.  Low blood pressure.  What happens before the procedure?  Follow instructions from your health care provider about eating or drinking restrictions.  Ask your health care provider about: ? Changing or stopping your regular medicines. This is especially important if you are taking diabetes medicines or blood thinners. ? Taking medicines such as aspirin and ibuprofen. These medicines can thin your blood. Do not take these medicines before your procedure if your health care provider instructs you not to.  Plan to have someone take you home after the procedure.  If you wear dentures, be ready to remove them before the procedure. What happens during the procedure?  To reduce your risk of infection, your health care  team will wash or sanitize their hands.  An IV tube will be put in a vein in your hand or arm. You will get medicines and fluids through this tube.  You will be given one or more of the following: ? A medicine to help you relax (sedative). ? A medicine to numb the area (local anesthetic). This medicine may be sprayed into your throat. It will make you feel more comfortable and keep you from gagging or coughing during the procedure. ? A medicine for pain.  A mouth guard  may be placed in your mouth to protect your teeth and to keep you from biting on the endoscope.  You will be asked to lie on your left side.  The endoscope will be lowered down your throat into your esophagus, stomach, and duodenum.  Air will be put into the endoscope. This will help your health care provider see better.  The lining of your esophagus, stomach, and duodenum will be examined.  Your health care provider may: ? Take a tissue sample so it can be looked at in a lab (biopsy). ? Remove growths. ? Remove objects (foreign bodies) that are stuck. ? Treat any bleeding with medicines or other devices that stop tissue from bleeding. ? Widen (dilate) or stretch narrowed areas of your esophagus and stomach.  The endoscope will be taken out. The procedure may vary among health care providers and hospitals. What happens after the procedure?  Your blood pressure, heart rate, breathing rate, and blood oxygen level will be monitored often until the medicines you were given have worn off.  Do not eat or drink anything until the numbing medicine has worn off and your gag reflex has returned. This information is not intended to replace advice given to you by your health care provider. Make sure you discuss any questions you have with your health care provider. Document Released: 05/07/2004 Document Revised: 06/12/2015 Document Reviewed: 11/28/2014 Elsevier Interactive Patient Education  2018 [employer]. Esophagogastroduodenoscopy, Care After Refer to this sheet in the next few weeks. These instructions provide you with information about caring for yourself after your procedure. Your health care provider may also give you more specific instructions. Your treatment has been planned according to current medical practices, but problems sometimes occur. Call your health care provider if you have any problems or questions after your procedure. What can I expect after the procedure? After the  procedure, it is common to have:  A sore throat.  Nausea.  Bloating.  Dizziness.  Fatigue.  Follow these instructions at home:  Do not eat or drink anything until the numbing medicine (local anesthetic) has worn off and your gag reflex has returned. You will know that the local anesthetic has worn off when you can swallow comfortably.  Do not drive for 24 hours if you received a medicine to help you relax (sedative).  If your health care provider took a tissue sample for testing during the procedure, make sure to get your test results. This is your responsibility. Ask your health care provider or the department performing the test when your results will be ready.  Keep all follow-up visits as told by your health care provider. This is important. Contact a health care provider if:  You cannot stop coughing.  You are not urinating.  You are urinating less than usual. Get help right away if:  You have trouble swallowing.  You cannot eat or drink.  You have throat or chest pain that gets worse.  You are dizzy or light-headed.  You faint.  You have nausea or vomiting.  You have chills.  You have a fever.  You have severe abdominal pain.  You have black, tarry, or bloody stools. This information is not intended to replace advice given to you by your health care provider. Make sure you discuss any questions you have with your health care provider. Document Released: 12/22/2011 Document Revised: 06/12/2015 Document Reviewed: 11/28/2014 Elsevier Interactive Patient Education  2018 [employer].  Esophageal Dilatation Esophageal dilatation is a procedure to open a blocked or narrowed part of the esophagus. The esophagus is the long tube in your throat that carries food and liquid from your mouth to your stomach. The procedure is also called esophageal dilation. You may need this procedure if you have a buildup of scar tissue in your esophagus that makes it difficult,  painful, or even impossible to swallow. This can be caused by gastroesophageal reflux disease (GERD). In rare cases, people need this procedure because they have cancer of the esophagus or a problem with the way food moves through the esophagus. Sometimes you may need to have another dilatation to enlarge the opening of the esophagus gradually. Tell a health care provider about:  Any allergies you have.  All medicines you are taking, including vitamins, herbs, eye drops, creams, and over-the-counter medicines.  Any problems you or family members have had with anesthetic medicines.  Any blood disorders you have.  Any surgeries you have had.  Any medical conditions you have.  Any antibiotic medicines you are required to take before dental procedures. What are the risks? Generally, this is a safe procedure. However, problems can occur and include:  Bleeding from a tear in the lining of the esophagus.  A hole (perforation) in the esophagus.  What happens before the procedure?  Do not eat or drink anything after midnight on the night before the procedure or as directed by your health care provider.  Ask your health care provider about changing or stopping your regular medicines. This is especially important if you are taking diabetes medicines or blood thinners.  Plan to have someone take you home after the procedure. What happens during the procedure?  You will be given a medicine that makes you relaxed and sleepy (sedative).  A medicine may be sprayed or gargled to numb the back of the throat.  Your health care provider can use various instruments to do an esophageal dilatation. During the procedure, the instrument used will be placed in your mouth and passed down into your esophagus. Options include: ? Simple dilators. This instrument is carefully placed in the esophagus to stretch it. ? Guided wire bougies. In this method, a flexible tube (endoscope) is used to insert a wire into  the esophagus. The dilator is passed over this wire to enlarge the esophagus. Then the wire is removed. ? Balloon dilators. An endoscope with a small balloon at the end is passed down into the esophagus. Inflating the balloon gently stretches the esophagus and opens it up. What happens after the procedure?  Your blood pressure, heart rate, breathing rate, and blood oxygen level will be monitored often until the medicines you were given have worn off.  Your throat may feel slightly sore and will probably still feel numb. This will improve slowly over time.  You will not be allowed to eat or drink until the throat numbness has resolved.  If this is a same-day procedure, you may be allowed to go home  once you have been able to drink, urinate, and sit on the edge of the bed without nausea or dizziness.  If this is a same-day procedure, you should have a friend or family member with you for the next 24 hours after the procedure. This information is not intended to replace advice given to you by your health care provider. Make sure you discuss any questions you have with your health care provider. Document Released: 02/25/2005 Document Revised: 06/12/2015 Document Reviewed: 05/16/2013 Elsevier Interactive Patient Education  2018 [place] Anesthesia is a term that refers to techniques, procedures, and medicines that help a person stay safe and comfortable during a medical procedure. Monitored anesthesia care, or sedation, is one type of anesthesia. Your anesthesia specialist may recommend sedation if you will be having a procedure that does not require you to be unconscious, such as:  Cataract surgery.  A dental procedure.  A biopsy.  A colonoscopy.  During the procedure, you may receive a medicine to help you relax (sedative). There are three levels of sedation:  Mild sedation. At this level, you may feel awake and relaxed. You will be able to follow  directions.  Moderate sedation. At this level, you will be sleepy. You may not remember the procedure.  Deep sedation. At this level, you will be asleep. You will not remember the procedure.  The more medicine you are given, the deeper your level of sedation will be. Depending on how you respond to the procedure, the anesthesia specialist may change your level of sedation or the type of anesthesia to fit your needs. An anesthesia specialist will monitor you closely during the procedure. Let your health care provider know about:  Any allergies you have.  All medicines you are taking, including vitamins, herbs, eye drops, creams, and over-the-counter medicines.  Any use of steroids (by mouth or as a cream).  Any problems you or family members have had with sedatives and anesthetic medicines.  Any blood disorders you have.  Any surgeries you have had.  Any medical conditions you have, such as sleep apnea.  Whether you are pregnant or may be pregnant.  Any use of cigarettes, alcohol, or street drugs. What are the risks? Generally, this is a safe procedure. However, problems may occur, including:  Getting too much medicine (oversedation).  Nausea.  Allergic reaction to medicines.  Trouble breathing. If this happens, a breathing tube may be used to help with breathing. It will be removed when you are awake and breathing on your own.  Heart trouble.  Lung trouble.  Before the procedure Staying hydrated Follow instructions from your health care provider about hydration, which may include:  Up to 2 hours before the procedure - you may continue to drink clear liquids, such as water, clear fruit juice, black coffee, and plain tea.  Eating and drinking restrictions Follow instructions from your health care provider about eating and drinking, which may include:  8 hours before the procedure - stop eating heavy meals or foods such as meat, fried foods, or fatty foods.  6 hours  before the procedure - stop eating light meals or foods, such as toast or cereal.  6 hours before the procedure - stop drinking milk or drinks that contain milk.  2 hours before the procedure - stop drinking clear liquids.  Medicines Ask your health care provider about:  Changing or stopping your regular medicines. This is especially important if you are taking diabetes medicines or blood thinners.  Taking medicines such as aspirin and ibuprofen. These medicines can thin your blood. Do not take these medicines before your procedure if your health care provider instructs you not to.  Tests and exams  You will have a physical exam.  You may have blood tests done to show: ? How well your kidneys and liver are working. ? How well your blood can clot.  General instructions  Plan to have someone take you home from the hospital or clinic.  If you will be going home right after the procedure, plan to have someone with you for 24 hours.  What happens during the procedure?  Your blood pressure, heart rate, breathing, level of pain and overall condition will be monitored.  An IV tube will be inserted into one of your veins.  Your anesthesia specialist will give you medicines as needed to keep you comfortable during the procedure. This may mean changing the level of sedation.  The procedure will be performed. After the procedure  Your blood pressure, heart rate, breathing rate, and blood oxygen level will be monitored until the medicines you were given have worn off.  Do not drive for 24 hours if you received a sedative.  You may: ? Feel sleepy, clumsy, or nauseous. ? Feel forgetful about what happened after the procedure. ? Have a sore throat if you had a breathing tube during the procedure. ? Vomit. This information is not intended to replace advice given to you by your health care provider. Make sure you discuss any questions you have with your health care provider. Document  Released: 09/30/2004 Document Revised: 06/13/2015 Document Reviewed: 04/27/2015 Elsevier Interactive Patient Education  2018 [place], Care After These instructions provide you with information about caring for yourself after your procedure. Your health care provider may also give you more specific instructions. Your treatment has been planned according to current medical practices, but problems sometimes occur. Call your health care provider if you have any problems or questions after your procedure. What can I expect after the procedure? After your procedure, it is common to:  Feel sleepy for several hours.  Feel clumsy and have poor balance for several hours.  Feel forgetful about what happened after the procedure.  Have poor judgment for several hours.  Feel nauseous or vomit.  Have a sore throat if you had a breathing tube during the procedure.  Follow these instructions at home: For at least 24 hours after the procedure:   Do not: ? Participate in activities in which you could fall or become injured. ? Drive. ? Use heavy machinery. ? Drink alcohol. ? Take sleeping pills or medicines that cause drowsiness. ? Make important decisions or sign legal documents. ? Take care of children on your own.  Rest. Eating and drinking  Follow the diet that is recommended by your health care provider.  If you vomit, drink water, juice, or soup when you can drink without vomiting.  Make sure you have little or no nausea before eating solid foods. General instructions  Have a responsible adult stay with you until you are awake and alert.  Take over-the-counter and prescription medicines only as told by your health care provider.  If you smoke, do not smoke without supervision.  Keep all follow-up visits as told by your health care provider. This is important. Contact a health care provider if:  You keep feeling nauseous or you keep  vomiting.  You feel light-headed.  You develop a rash.  You  have a fever. Get help right away if:  You have trouble breathing. This information is not intended to replace advice given to you by your health care provider. Make sure you discuss any questions you have with your health care provider. Document Released: 04/27/2015 Document Revised: 08/27/2015 Document Reviewed: 04/27/2015 Elsevier Interactive Patient Education  [employer].

## 2017-07-19 ENCOUNTER — Encounter (HOSPITAL_COMMUNITY)
Admission: RE | Admit: 2017-07-19 | Discharge: 2017-07-19 | Disposition: A | Discharge status: Home / Self Care | Payer: Medicare Other | Source: Ambulatory Visit | Attending: Internal Medicine | Admitting: Internal Medicine

## 2017-07-19 ENCOUNTER — Encounter (HOSPITAL_COMMUNITY): Payer: Self-pay

## 2017-07-20 ENCOUNTER — Telehealth: Payer: Self-pay | Admitting: *Deleted

## 2017-07-20 ENCOUNTER — Ambulatory Visit (HOSPITAL_COMMUNITY): Payer: Medicare Other

## 2017-07-20 NOTE — Telephone Encounter (Signed)
Received call from [place] in End[facility]. Patient did not show up for pre-op today. She did speak with patient daughter yesterday. She was going to have another family member take patient to pre-op but then called back and stated that person was unable to take her. Hoyle Sauer has not heard back from daughter. Called # for pt and received VM--LMOVM

## 2017-07-22 NOTE — Telephone Encounter (Signed)
Called mobile # for patient and received VM for "[name] Denny"--LMOVM. Called home # and line rings multiple times, no answer and no VM

## 2017-07-22 NOTE — Telephone Encounter (Signed)
Spoke with [name] and she has not heard from anyone regarding patient pre-op. Procedure has been cancelled.  Called mobile # listed for patient which is for "[employer]" patient daughter--LMOVM. Called home # and no VM.  FYI to [facility] procedure was cancelled and unable to get in touch with patient. Looks like CT was also cancelled x 2.

## 2017-07-25 ENCOUNTER — Encounter (HOSPITAL_COMMUNITY): Admission: RE | Payer: Self-pay | Source: Ambulatory Visit

## 2017-07-25 ENCOUNTER — Ambulatory Visit (HOSPITAL_COMMUNITY): Admission: RE | Admit: 2017-07-25 | Payer: Medicare Other | Source: Ambulatory Visit | Admitting: Internal Medicine

## 2017-07-25 SURGERY — ESOPHAGOGASTRODUODENOSCOPY (EGD) WITH PROPOFOL
Anesthesia: Monitor Anesthesia Care

## 2017-07-25 NOTE — Telephone Encounter (Signed)
Patient would always like to reschedule her mom's CT Scan

## 2017-07-25 NOTE — Telephone Encounter (Signed)
[name] called to reschedule her mom's pre-op appt and procedure.  She can be reached at [phone].

## 2017-07-26 NOTE — Telephone Encounter (Signed)
CT rescheduled to 08/10/17 at 4:00pm, arrive at 3:45pm. NPO 4 hours prior to test. Pick up contrast. Tried to call daughter Veterinary surgeon), no answer, LMOVM for return call.

## 2017-07-26 NOTE — Telephone Encounter (Signed)
Tried to call daughter again, call went straight to VM.

## 2017-07-26 NOTE — Telephone Encounter (Signed)
Noted  

## 2017-07-27 ENCOUNTER — Other Ambulatory Visit: Payer: Self-pay

## 2017-07-27 DIAGNOSIS — R110 Nausea: Secondary | ICD-10-CM

## 2017-07-27 DIAGNOSIS — R1310 Dysphagia, unspecified: Secondary | ICD-10-CM

## 2017-07-27 DIAGNOSIS — R1013 Epigastric pain: Secondary | ICD-10-CM

## 2017-07-27 DIAGNOSIS — R1319 Other dysphagia: Secondary | ICD-10-CM

## 2017-07-27 NOTE — Telephone Encounter (Signed)
Tried to call daughter, no answer, LMOVM and informed of CT appt. Asked her to call office to reschedule procedure. Letter mailed for CT appt.

## 2017-07-27 NOTE — Telephone Encounter (Signed)
[name] called office. She is unable to take her for CT 08/10/17, gave her phone number to central scheduling to reschedule appt. EGD/DIL with Propofol with RMR scheduled 09/15/17 at 11:30am. Will mail instructions after pre-op appt is scheduled.

## 2017-07-27 NOTE — Telephone Encounter (Signed)
Tried to call daughter to inform of pre-op appt 09/09/17 at 8:00am, no answer, LMOVM. Appt letter mailed with procedure instructions. Noted on pt's appt desk, CT rescheduled to 08/15/17 at 1:00pm.

## 2017-08-10 ENCOUNTER — Ambulatory Visit (HOSPITAL_COMMUNITY): Payer: Medicare Other

## 2017-08-15 ENCOUNTER — Ambulatory Visit (HOSPITAL_COMMUNITY)
Admission: RE | Admit: 2017-08-15 | Discharge: 2017-08-15 | Disposition: A | Discharge status: Home / Self Care | Payer: Medicare Other | Source: Ambulatory Visit | Attending: Gastroenterology | Admitting: Gastroenterology

## 2017-08-15 DIAGNOSIS — R1013 Epigastric pain: Secondary | ICD-10-CM | POA: Diagnosis present

## 2017-08-15 DIAGNOSIS — Z90710 Acquired absence of both cervix and uterus: Secondary | ICD-10-CM | POA: Insufficient documentation

## 2017-08-15 DIAGNOSIS — I700 Atherosclerosis of aorta: Secondary | ICD-10-CM | POA: Diagnosis not present

## 2017-08-15 DIAGNOSIS — R1032 Left lower quadrant pain: Secondary | ICD-10-CM | POA: Diagnosis present

## 2017-08-15 DIAGNOSIS — K439 Ventral hernia without obstruction or gangrene: Secondary | ICD-10-CM | POA: Insufficient documentation

## 2017-08-15 DIAGNOSIS — Z9889 Other specified postprocedural states: Secondary | ICD-10-CM | POA: Insufficient documentation

## 2017-08-15 DIAGNOSIS — Z9884 Bariatric surgery status: Secondary | ICD-10-CM | POA: Diagnosis not present

## 2017-08-15 LAB — POCT I-STAT CREATININE: Creatinine, Ser: 0.9 mg/dL (ref 0.44–1.00)

## 2017-08-15 MED ORDER — IOPAMIDOL (ISOVUE-300) INJECTION 61%
100.0000 mL | Freq: Once | INTRAVENOUS | Status: AC | PRN
  Administered 2017-08-15: 100 mL via INTRAVENOUS

## 2017-09-01 ENCOUNTER — Telehealth: Payer: Self-pay | Admitting: Internal Medicine

## 2017-09-01 NOTE — Telephone Encounter (Signed)
Pt's daughter, Donella Stade, called to verify pre op and procedure dates. Then she called back asking if she could come by to pick up any lab orders patient will need prior to procedure. Please call her at [phone]

## 2017-09-05 NOTE — Telephone Encounter (Signed)
Lmom, waiting on a return call.  

## 2017-09-05 NOTE — Telephone Encounter (Signed)
Spoke with daughter, no current lab orders are in system for pt or needed at this time.

## 2017-09-06 NOTE — Patient Instructions (Signed)
JALAN BODI  09/06/2017     @PREFPERIOPPHARMACY @   Your procedure is scheduled on 09/15/2017.  Report to [facility] at 9:45 A.M.  Call this number if you have problems the morning of surgery:  [phone]   Remember:  Do not eat or drink after midnight.      Take these medicines the morning of surgery with A SIP OF WATER Xanax, Nexium, Hydrocodone, Synthroid, Inderal, Effexor    Do not wear jewelry, make-up or nail polish.  Do not wear lotions, powders, or perfumes, or deodorant.  Do not shave 48 hours prior to surgery.  Men may shave face and neck.  Do not bring valuables to the hospital.  [facility] is not responsible for any belongings or valuables.  Contacts, dentures or bridgework may not be worn into surgery.  Leave your suitcase in the car.  After surgery it may be brought to your room.  For patients admitted to the hospital, discharge time will be determined by your treatment team.  Patients discharged the day of surgery will not be allowed to drive home.    Please read over the following fact sheets that you were given. Anesthesia Post-op Instructions     PATIENT INSTRUCTIONS POST-ANESTHESIA  IMMEDIATELY FOLLOWING SURGERY:  Do not drive or operate machinery for the first twenty four hours after surgery.  Do not make any important decisions for twenty four hours after surgery or while taking narcotic pain medications or sedatives.  If you develop intractable nausea and vomiting or a severe headache please notify your doctor immediately.  FOLLOW-UP:  Please make an appointment with your surgeon as instructed. You do not need to follow up with anesthesia unless specifically instructed to do so.  WOUND CARE INSTRUCTIONS (if applicable):  Keep a dry clean dressing on the anesthesia/puncture wound site if there is drainage.  Once the wound has quit draining you may leave it open to air.  Generally you should leave the bandage intact for twenty four hours unless there  is drainage.  If the epidural site drains for more than 36-48 hours please call the anesthesia department.  QUESTIONS?:  Please feel free to call your physician or the hospital operator if you have any questions, and they will be happy to assist you.      Esophagogastroduodenoscopy Esophagogastroduodenoscopy (EGD) is a procedure to examine the lining of the esophagus, stomach, and first part of the small intestine (duodenum). This procedure is done to check for problems such as inflammation, bleeding, ulcers, or growths. During this procedure, a long, flexible, lighted tube with a camera attached (endoscope) is inserted down the throat. Tell a health care provider about:  Any allergies you have.  All medicines you are taking, including vitamins, herbs, eye drops, creams, and over-the-counter medicines.  Any problems you or family members have had with anesthetic medicines.  Any blood disorders you have.  Any surgeries you have had.  Any medical conditions you have.  Whether you are pregnant or may be pregnant. What are the risks? Generally, this is a safe procedure. However, problems may occur, including:  Infection.  Bleeding.  A tear (perforation) in the esophagus, stomach, or duodenum.  Trouble breathing.  Excessive sweating.  Spasms of the larynx.  A slowed heartbeat.  Low blood pressure.  What happens before the procedure?  Follow instructions from your health care provider about eating or drinking restrictions.  Ask your health care provider about: ? Changing or stopping your regular medicines. This  is especially important if you are taking diabetes medicines or blood thinners. ? Taking medicines such as aspirin and ibuprofen. These medicines can thin your blood. Do not take these medicines before your procedure if your health care provider instructs you not to.  Plan to have someone take you home after the procedure.  If you wear dentures, be ready to remove  them before the procedure. What happens during the procedure?  To reduce your risk of infection, your health care team will wash or sanitize their hands.  An IV tube will be put in a vein in your hand or arm. You will get medicines and fluids through this tube.  You will be given one or more of the following: ? A medicine to help you relax (sedative). ? A medicine to numb the area (local anesthetic). This medicine may be sprayed into your throat. It will make you feel more comfortable and keep you from gagging or coughing during the procedure. ? A medicine for pain.  A mouth guard may be placed in your mouth to protect your teeth and to keep you from biting on the endoscope.  You will be asked to lie on your left side.  The endoscope will be lowered down your throat into your esophagus, stomach, and duodenum.  Air will be put into the endoscope. This will help your health care provider see better.  The lining of your esophagus, stomach, and duodenum will be examined.  Your health care provider may: ? Take a tissue sample so it can be looked at in a lab (biopsy). ? Remove growths. ? Remove objects (foreign bodies) that are stuck. ? Treat any bleeding with medicines or other devices that stop tissue from bleeding. ? Widen (dilate) or stretch narrowed areas of your esophagus and stomach.  The endoscope will be taken out. The procedure may vary among health care providers and hospitals. What happens after the procedure?  Your blood pressure, heart rate, breathing rate, and blood oxygen level will be monitored often until the medicines you were given have worn off.  Do not eat or drink anything until the numbing medicine has worn off and your gag reflex has returned. This information is not intended to replace advice given to you by your health care provider. Make sure you discuss any questions you have with your health care provider. Document Released: 05/07/2004 Document Revised:  06/12/2015 Document Reviewed: 11/28/2014 Elsevier Interactive Patient Education  2018 [employer]. Esophageal Dilatation Esophageal dilatation is a procedure to open a blocked or narrowed part of the esophagus. The esophagus is the long tube in your throat that carries food and liquid from your mouth to your stomach. The procedure is also called esophageal dilation. You may need this procedure if you have a buildup of scar tissue in your esophagus that makes it difficult, painful, or even impossible to swallow. This can be caused by gastroesophageal reflux disease (GERD). In rare cases, people need this procedure because they have cancer of the esophagus or a problem with the way food moves through the esophagus. Sometimes you may need to have another dilatation to enlarge the opening of the esophagus gradually. Tell a health care provider about:  Any allergies you have.  All medicines you are taking, including vitamins, herbs, eye drops, creams, and over-the-counter medicines.  Any problems you or family members have had with anesthetic medicines.  Any blood disorders you have.  Any surgeries you have had.  Any medical conditions you have.  Any  antibiotic medicines you are required to take before dental procedures. What are the risks? Generally, this is a safe procedure. However, problems can occur and include:  Bleeding from a tear in the lining of the esophagus.  A hole (perforation) in the esophagus.  What happens before the procedure?  Do not eat or drink anything after midnight on the night before the procedure or as directed by your health care provider.  Ask your health care provider about changing or stopping your regular medicines. This is especially important if you are taking diabetes medicines or blood thinners.  Plan to have someone take you home after the procedure. What happens during the procedure?  You will be given a medicine that makes you relaxed and sleepy  (sedative).  A medicine may be sprayed or gargled to numb the back of the throat.  Your health care provider can use various instruments to do an esophageal dilatation. During the procedure, the instrument used will be placed in your mouth and passed down into your esophagus. Options include: ? Simple dilators. This instrument is carefully placed in the esophagus to stretch it. ? Guided wire bougies. In this method, a flexible tube (endoscope) is used to insert a wire into the esophagus. The dilator is passed over this wire to enlarge the esophagus. Then the wire is removed. ? Balloon dilators. An endoscope with a small balloon at the end is passed down into the esophagus. Inflating the balloon gently stretches the esophagus and opens it up. What happens after the procedure?  Your blood pressure, heart rate, breathing rate, and blood oxygen level will be monitored often until the medicines you were given have worn off.  Your throat may feel slightly sore and will probably still feel numb. This will improve slowly over time.  You will not be allowed to eat or drink until the throat numbness has resolved.  If this is a same-day procedure, you may be allowed to go home once you have been able to drink, urinate, and sit on the edge of the bed without nausea or dizziness.  If this is a same-day procedure, you should have a friend or family member with you for the next 24 hours after the procedure. This information is not intended to replace advice given to you by your health care provider. Make sure you discuss any questions you have with your health care provider. Document Released: 02/25/2005 Document Revised: 06/12/2015 Document Reviewed: 05/16/2013 Elsevier Interactive Patient Education  [employer].

## 2017-09-07 ENCOUNTER — Other Ambulatory Visit: Payer: Self-pay

## 2017-09-07 ENCOUNTER — Encounter (HOSPITAL_COMMUNITY): Payer: Self-pay

## 2017-09-07 ENCOUNTER — Encounter (HOSPITAL_COMMUNITY)
Admission: RE | Admit: 2017-09-07 | Discharge: 2017-09-07 | Disposition: A | Discharge status: Home / Self Care | Payer: Medicare Other | Source: Ambulatory Visit | Attending: Internal Medicine | Admitting: Internal Medicine

## 2017-09-07 DIAGNOSIS — R9431 Abnormal electrocardiogram [ECG] [EKG]: Secondary | ICD-10-CM | POA: Insufficient documentation

## 2017-09-07 DIAGNOSIS — Z01810 Encounter for preprocedural cardiovascular examination: Secondary | ICD-10-CM | POA: Diagnosis not present

## 2017-09-07 DIAGNOSIS — Z01812 Encounter for preprocedural laboratory examination: Secondary | ICD-10-CM | POA: Insufficient documentation

## 2017-09-07 HISTORY — DX: Personal history of urinary calculi: Z87.442

## 2017-09-07 HISTORY — DX: Anemia, unspecified: D64.9

## 2017-09-07 LAB — BASIC METABOLIC PANEL
ANION GAP: 8 (ref 5–15)
BUN: 19 mg/dL (ref 8–23)
CHLORIDE: 103 mmol/L (ref 98–111)
CO2: 27 mmol/L (ref 22–32)
Calcium: 9.5 mg/dL (ref 8.9–10.3)
Creatinine, Ser: 1.01 mg/dL — ABNORMAL HIGH (ref 0.44–1.00)
GFR calc Af Amer: >60 mL/min (ref >60)
GFR calc non Af Amer: 55 mL/min — ABNORMAL LOW (ref >60)
Glucose, Bld: 105 mg/dL — ABNORMAL HIGH (ref 70–99)
POTASSIUM: 3.7 mmol/L (ref 3.5–5.1)
Sodium: 138 mmol/L (ref 135–145)

## 2017-09-07 LAB — CBC
HEMATOCRIT: 40.9 % (ref 36.0–46.0)
Hemoglobin: 13.1 g/dL (ref 12.0–15.0)
MCH: 29 pg (ref 26.0–34.0)
MCHC: 32 g/dL (ref 30.0–36.0)
MCV: 90.5 fL (ref 78.0–100.0)
Platelets: 207 10*3/uL (ref 150–400)
RBC: 4.52 MIL/uL (ref 3.87–5.11)
RDW: 14.2 % (ref 11.5–15.5)
WBC: 6.8 10*3/uL (ref 4.0–10.5)

## 2017-09-09 ENCOUNTER — Other Ambulatory Visit (HOSPITAL_COMMUNITY): Payer: Self-pay

## 2017-09-15 ENCOUNTER — Encounter (HOSPITAL_COMMUNITY)
Admission: RE | Disposition: A | Discharge status: Home / Self Care | Payer: Self-pay | Source: Ambulatory Visit | Attending: Internal Medicine

## 2017-09-15 ENCOUNTER — Ambulatory Visit (HOSPITAL_COMMUNITY)
Admission: RE | Admit: 2017-09-15 | Discharge: 2017-09-15 | Disposition: A | Discharge status: Home / Self Care | Payer: Medicare Other | Source: Ambulatory Visit | Attending: Internal Medicine | Admitting: Internal Medicine

## 2017-09-15 ENCOUNTER — Encounter (HOSPITAL_COMMUNITY): Payer: Self-pay | Admitting: *Deleted

## 2017-09-15 ENCOUNTER — Ambulatory Visit (HOSPITAL_COMMUNITY): Payer: Medicare Other | Admitting: Anesthesiology

## 2017-09-15 ENCOUNTER — Other Ambulatory Visit: Payer: Self-pay

## 2017-09-15 DIAGNOSIS — K219 Gastro-esophageal reflux disease without esophagitis: Secondary | ICD-10-CM | POA: Insufficient documentation

## 2017-09-15 DIAGNOSIS — G4730 Sleep apnea, unspecified: Secondary | ICD-10-CM | POA: Insufficient documentation

## 2017-09-15 DIAGNOSIS — I129 Hypertensive chronic kidney disease with stage 1 through stage 4 chronic kidney disease, or unspecified chronic kidney disease: Secondary | ICD-10-CM | POA: Insufficient documentation

## 2017-09-15 DIAGNOSIS — R110 Nausea: Secondary | ICD-10-CM

## 2017-09-15 DIAGNOSIS — R1310 Dysphagia, unspecified: Secondary | ICD-10-CM | POA: Diagnosis not present

## 2017-09-15 DIAGNOSIS — Z87891 Personal history of nicotine dependence: Secondary | ICD-10-CM | POA: Diagnosis not present

## 2017-09-15 DIAGNOSIS — R1013 Epigastric pain: Secondary | ICD-10-CM

## 2017-09-15 DIAGNOSIS — E039 Hypothyroidism, unspecified: Secondary | ICD-10-CM | POA: Diagnosis not present

## 2017-09-15 DIAGNOSIS — K439 Ventral hernia without obstruction or gangrene: Secondary | ICD-10-CM | POA: Diagnosis not present

## 2017-09-15 DIAGNOSIS — E876 Hypokalemia: Secondary | ICD-10-CM

## 2017-09-15 DIAGNOSIS — Z9884 Bariatric surgery status: Secondary | ICD-10-CM | POA: Diagnosis not present

## 2017-09-15 DIAGNOSIS — F419 Anxiety disorder, unspecified: Secondary | ICD-10-CM | POA: Insufficient documentation

## 2017-09-15 DIAGNOSIS — N189 Chronic kidney disease, unspecified: Secondary | ICD-10-CM | POA: Insufficient documentation

## 2017-09-15 DIAGNOSIS — R1319 Other dysphagia: Secondary | ICD-10-CM

## 2017-09-15 DIAGNOSIS — R1011 Right upper quadrant pain: Secondary | ICD-10-CM | POA: Diagnosis not present

## 2017-09-15 DIAGNOSIS — Z79899 Other long term (current) drug therapy: Secondary | ICD-10-CM | POA: Insufficient documentation

## 2017-09-15 DIAGNOSIS — R1314 Dysphagia, pharyngoesophageal phase: Secondary | ICD-10-CM | POA: Insufficient documentation

## 2017-09-15 HISTORY — PX: MALONEY DILATION: SHX5535

## 2017-09-15 HISTORY — PX: ESOPHAGOGASTRODUODENOSCOPY (EGD) WITH PROPOFOL: SHX5813

## 2017-09-15 LAB — COMPREHENSIVE METABOLIC PANEL
ALT: 9 U/L (ref 0–44)
AST: 14 U/L — ABNORMAL LOW (ref 15–41)
Albumin: 4.2 g/dL (ref 3.5–5.0)
Alkaline Phosphatase: 68 U/L (ref 38–126)
Anion gap: 10 (ref 5–15)
BUN: 7 mg/dL — ABNORMAL LOW (ref 8–23)
CHLORIDE: 107 mmol/L (ref 98–111)
CO2: 26 mmol/L (ref 22–32)
Calcium: 9.3 mg/dL (ref 8.9–10.3)
Creatinine, Ser: 0.89 mg/dL (ref 0.44–1.00)
Glucose, Bld: 110 mg/dL — ABNORMAL HIGH (ref 70–99)
POTASSIUM: 2.9 mmol/L — AB (ref 3.5–5.1)
Sodium: 143 mmol/L (ref 135–145)
Total Bilirubin: 1 mg/dL (ref 0.3–1.2)
Total Protein: 7.3 g/dL (ref 6.5–8.1)

## 2017-09-15 LAB — CBC
HCT: 43.6 % (ref 36.0–46.0)
Hemoglobin: 14.2 g/dL (ref 12.0–15.0)
MCH: 29.7 pg (ref 26.0–34.0)
MCHC: 32.6 g/dL (ref 30.0–36.0)
MCV: 91.2 fL (ref 78.0–100.0)
PLATELETS: 181 10*3/uL (ref 150–400)
RBC: 4.78 MIL/uL (ref 3.87–5.11)
RDW: 14.7 % (ref 11.5–15.5)
WBC: 9.2 10*3/uL (ref 4.0–10.5)

## 2017-09-15 LAB — LIPASE, BLOOD: LIPASE: 23 U/L (ref 11–51)

## 2017-09-15 SURGERY — ESOPHAGOGASTRODUODENOSCOPY (EGD) WITH PROPOFOL
Anesthesia: General

## 2017-09-15 MED ORDER — POTASSIUM CHLORIDE 20 MEQ PO PACK: 40.0000 meq | PACK | Freq: Every day | ORAL | Status: AC

## 2017-09-15 MED ORDER — PROPOFOL 500 MG/50ML IV EMUL
INTRAVENOUS | Status: DC | PRN
  Administered 2017-09-15: 150 ug/kg/min via INTRAVENOUS
  Administered 2017-09-15: 100 ug/kg/min via INTRAVENOUS

## 2017-09-15 MED ORDER — CHLORHEXIDINE GLUCONATE CLOTH 2 % EX PADS: 6.0000 | MEDICATED_PAD | Freq: Once | CUTANEOUS | Status: DC

## 2017-09-15 MED ORDER — SODIUM CHLORIDE 0.9% FLUSH
INTRAVENOUS | Status: AC
  Filled 2017-09-15: qty 10

## 2017-09-15 MED ORDER — LACTATED RINGERS IV SOLN
INTRAVENOUS | Status: DC
  Administered 2017-09-15: 11:00:00 via INTRAVENOUS

## 2017-09-15 MED ORDER — ONDANSETRON HCL 4 MG/2ML IJ SOLN
INTRAMUSCULAR | Status: DC | PRN
  Administered 2017-09-15: 4 mg via INTRAVENOUS

## 2017-09-15 MED ORDER — PROPOFOL 10 MG/ML IV BOLUS
INTRAVENOUS | Status: DC | PRN
  Administered 2017-09-15: 40 mg via INTRAVENOUS
  Administered 2017-09-15: 20 mg via INTRAVENOUS
  Administered 2017-09-15: 40 mg via INTRAVENOUS
  Administered 2017-09-15: 20 mg via INTRAVENOUS

## 2017-09-15 MED ORDER — FENTANYL CITRATE (PF) 100 MCG/2ML IJ SOLN: 25.0000 ug | INTRAMUSCULAR | Status: DC | PRN

## 2017-09-15 NOTE — Anesthesia Preprocedure Evaluation (Signed)
Anesthesia Evaluation  Patient identified by MRN, date of birth, ID band Patient awake    Reviewed: Allergy & Precautions, NPO status , Patient's Chart, lab work & pertinent test results  History of Anesthesia Complications (+) PONV  Airway Mallampati: II  TM Distance: >3 FB Neck ROM: Full    Dental no notable dental hx. (+) Teeth Intact   Pulmonary neg pulmonary ROS, shortness of breath, sleep apnea , Recent URI , Resolved, former smoker,  States unable to tolerate CPAP Denies O2 use smoking, or breathing meds    Pulmonary exam normal breath sounds clear to auscultation       Cardiovascular Exercise Tolerance: Poor hypertension, Pt. on medications and Pt. on home beta blockers negative cardio ROS Normal cardiovascular examII Rhythm:Regular Rate:Normal  S/p fall two weeks ago -now using walker  S/p Gastric restrictive procedure remotely -c/w GI issues  hasnt taken propranolol in the last two days    Neuro/Psych  Headaches, PSYCHIATRIC DISORDERS Anxiety Depression  Neuromuscular disease negative neurological ROS  negative psych ROS   GI/Hepatic negative GI ROS, Neg liver ROS, hiatal hernia, PUD, GERD  Medicated and Controlled,Slight Sx today  Will do GETA as needed    Endo/Other  negative endocrine ROSHypothyroidism   Renal/GU Renal InsufficiencyRenal diseasenegative Renal ROS  negative genitourinary   Musculoskeletal negative musculoskeletal ROS (+) Arthritis , Osteoarthritis,    Abdominal   Peds negative pediatric ROS (+)  Hematology negative hematology ROS (+) anemia ,   Anesthesia Other Findings   Reproductive/Obstetrics negative OB ROS                             Anesthesia Physical Anesthesia Plan  ASA: III  Anesthesia Plan: General   Post-op Pain Management:    Induction: Intravenous  PONV Risk Score and Plan:   Airway Management Planned: Simple Face Mask and Nasal  Cannula  Additional Equipment:   Intra-op Plan:   Post-operative Plan:   Informed Consent: I have reviewed the patients History and Physical, chart, labs and discussed the procedure including the risks, benefits and alternatives for the proposed anesthesia with the patient or authorized representative who has indicated his/her understanding and acceptance.   Dental advisory given  Plan Discussed with: CRNA  Anesthesia Plan Comments: (GA vs GETA as needed )        Anesthesia Quick Evaluation

## 2017-09-15 NOTE — Discharge Instructions (Signed)
EGD Discharge instructions Please read the instructions outlined below and refer to this sheet in the next few weeks. These discharge instructions provide you with general information on caring for yourself after you leave the hospital. Your doctor may also give you specific instructions. While your treatment has been planned according to the most current medical practices available, unavoidable complications occasionally occur. If you have any problems or questions after discharge, please call your doctor. ACTIVITY  You may resume your regular activity but move at a slower pace for the next 24 hours.   Take frequent rest periods for the next 24 hours.   Walking will help expel (get rid of) the air and reduce the bloated feeling in your abdomen.   No driving for 24 hours (because of the anesthesia (medicine) used during the test).   You may shower.   Do not sign any important legal documents or operate any machinery for 24 hours (because of the anesthesia used during the test).  NUTRITION  Drink plenty of fluids.   You may resume your normal diet.   Begin with a light meal and progress to your normal diet.   Avoid alcoholic beverages for 24 hours or as instructed by your caregiver.  MEDICATIONS  You may resume your normal medications unless your caregiver tells you otherwise.  WHAT YOU CAN EXPECT TODAY  You may experience abdominal discomfort such as a feeling of fullness or gas pains.  FOLLOW-UP  Your doctor will discuss the results of your test with you.  SEEK IMMEDIATE MEDICAL ATTENTION IF ANY OF THE FOLLOWING OCCUR:  Excessive nausea (feeling sick to your stomach) and/or vomiting.   Severe abdominal pain and distention (swelling).   Trouble swallowing.   Temperature over 101 F (37.8 C).   Rectal bleeding or vomiting of blood.   Chem 12, serum lipase and CBC  Continue Nexium daily  Tentatively, we'll arrange a surgery consultattion with Dr. Arnoldo Morale regarding  spigelian hernia possibly contributing to abdominal pain

## 2017-09-15 NOTE — Op Note (Signed)
[facility] Patient Name: [name] Procedure Date: 09/15/2017 11:20 AM MRN: [id number] Date of Birth: 1945-08-29 Attending MD: Norvel Richards , MD CSN: [id number] Age: 72 Admit Type: Outpatient Procedure:                Upper GI endoscopy Indications:              Epigastric abdominal pain, Abdominal pain in the                            right upper quadrant, Dysphagia Providers:                Norvel Richards, MD, Otis Peak B. Sharon Seller, RN,                            Nelma Rothman, Technician Referring MD:             Lemmie Evens Medicines:                Propofol per Anesthesia Complications:            No immediate complications. Estimated Blood Loss:     Estimated blood loss: none. Procedure:                Pre-Anesthesia Assessment:                           - Prior to the procedure, a History and Physical                            was performed, and patient medications and                            allergies were reviewed. The patient's tolerance of                            previous anesthesia was also reviewed. The risks                            and benefits of the procedure and the sedation                            options and risks were discussed with the patient.                            All questions were answered, and informed consent                            was obtained. Prior Anticoagulants: The patient has                            taken no previous anticoagulant or antiplatelet                            agents. ASA Grade Assessment: II - A patient with  mild systemic disease. After reviewing the risks                            and benefits, the patient was deemed in                            satisfactory condition to undergo the procedure.                           After obtaining informed consent, the endoscope was                            passed under direct vision. Throughout the   procedure, the patient's blood pressure, pulse, and                            oxygen saturations were monitored continuously. The                            GIF-H190 ([id number]) scope was introduced through the                            and advanced to the afferent and efferent jejunal                            loops. The upper GI endoscopy was accomplished                            without difficulty. The patient tolerated the                            procedure well. Scope In: 11:47:45 AM Scope Out: 11:53:25 AM Total Procedure Duration: 0 hours 5 minutes 40 seconds  Findings:      The examined esophagus was normal. stomach surgically alter consistent       with prior history of gastric bypass surgery. Small gastric pouch with       Billroth II anatomy. minimal friability at anastomosis. Patent and       normal-appearing effernet and affernet limbs. The scope was withdrawn.       Dilation was performed with a Maloney dilator with mild resistance at 91       Fr. The dilation site was examined following endoscope reinsertion and       showed no change. Estimated blood loss: none. Impression:               - Normal esophagus. status post prior gastric                            by-pass surgery. Findings do not explain abdominal                            pain. recent CT demonstrated a spigelian hernia.                            Need updated labs. M near future.                           -  No specimens collected. Continue Nexium. Moderate Sedation:      Moderate (conscious) sedation was administered by the endoscopy nurse       and supervised by the endoscopist. The following parameters were       monitored: oxygen saturation, heart rate, blood pressure, respiratory       rate, EKG, adequacy of pulmonary ventilation, and response to care.       Total physician intraservice time was 10 minutes. Recommendation:           - Patient has a contact number available for                             emergencies. The signs and symptoms of potential                            delayed complications were discussed with the                            patient. Return to normal activities tomorrow.                            Written discharge instructions were provided to the                            patient.                           - Resume previous diet.                           - Continue present medications.                           - Chem-12, CBC, seru lipase. Anticipate surgical                            consultationin the near future.                           - No repeat upper endoscopy.                           - Return to GI office (date not yet determined). Procedure Code(s):        --- Professional ---                           [phone], Esophagogastroduodenoscopy, flexible,                            transoral; diagnostic, including collection of                            specimen(s) by brushing or washing, when performed                            (separate procedure)  10258, Dilation of esophagus, by unguided sound or                            bougie, single or multiple passes                           G0500, Moderate sedation services provided by the                            same physician or other qualified health care                            professional performing a gastrointestinal                            endoscopic service that sedation supports,                            requiring the presence of an independent trained                            observer to assist in the monitoring of the                            patient's level of consciousness and physiological                            status; initial 15 minutes of intra-service time;                            patient age 19 years or older (additional time may                            be reported with [phone], as appropriate) Diagnosis Code(s):        --- Professional  ---                           R10.13, Epigastric pain                           R10.11, Right upper quadrant pain                           R13.10, Dysphagia, unspecified CPT copyright 2017 American Medical Association. All rights reserved. The codes documented in this report are preliminary and upon coder review may  be revised to meet current compliance requirements. Cristopher Estimable. Rourk, MD Norvel Richards, MD 09/15/2017 12:04:26 PM This report has been signed electronically. Number of Addenda: 0

## 2017-09-15 NOTE — Anesthesia Postprocedure Evaluation (Signed)
Anesthesia Post Note  Patient: PIERCE BIAGINI  Procedure(s) Performed: ESOPHAGOGASTRODUODENOSCOPY (EGD) WITH PROPOFOL (N/A ) MALONEY DILATION (N/A )  Patient location during evaluation: PACU Anesthesia Type: General Level of consciousness: awake and alert and patient cooperative Pain management: satisfactory to patient Vital Signs Assessment: post-procedure vital signs reviewed and stable Respiratory status: spontaneous breathing Cardiovascular status: stable Postop Assessment: no apparent nausea or vomiting Anesthetic complications: no     Last Vitals:  Vitals:   09/15/17 1200 09/15/17 1215  BP: 126/84 (!) 169/77  Pulse: 80 65  Resp: (!) 8 18  Temp:    SpO2: 97% 98%    Last Pain:  Vitals:   09/15/17 1215  TempSrc:   PainSc: 5                  Laureen Frederic

## 2017-09-15 NOTE — H&P (Signed)
@LOGO @   Primary Care Physician:  Lemmie Evens, MD Primary Gastroenterologist:  Dr. Gala Romney  Pre-Procedure History & Physical: HPI:  [name] is a 72 y.o. female here for further evaluation of esophageal dysphagia and upper abdominal pain. Chronic biliary dilation and spegilian hernia on CT.[name] Upper abdominal pain worsened recently. Seen at [facility] last several days. Records not available.  Denies NSAIDs. Past Medical History:  Diagnosis Date  . Anemia   . Anxiety   . Arthritis   . Blood transfusion    /w bleeding ulcer & post knee replacement- 2012  . Chronic kidney disease    h/o renal calculi, h/o lithotripsy  . Copper deficiency 06/17/2015  . Depression   . GERD (gastroesophageal reflux disease)    h/o bleeding ulcer, hosp., Rosana Berger  . Headache(784.0)   . Hiatal hernia   . History of kidney stones   . Hypothyroidism   . Morbid obesity ([place])   . Neuralgia    spondylosis & stenosis  . Obsessive-compulsive disorder   . PONV (postoperative nausea and vomiting)   . Recurrent upper respiratory infection (URI)   . Shortness of breath    with exertion  . Sleep apnea    [facility]- [facility], CPAP not used in 3 months, states she has a new machine  but hasn't used   . Thyroid disease     Past Surgical History:  Procedure Laterality Date  . ABDOMINAL HYSTERECTOMY    . ANTERIOR CERVICAL DECOMP/DISCECTOMY FUSION  12/09/2010   Procedure: ANTERIOR CERVICAL DECOMPRESSION/DISCECTOMY FUSION 3 LEVELS;  Surgeon: Ophelia Charter;  Location: [place] NEURO ORS;  Service: Neurosurgery;  Laterality: N/A;  Cervical three-four,Cervical four-five,Cervical Five-Six,Cervical Six-Seven ANTERIOR CERVICAL DECOMPRESSION WITH FUSION INTERBODY PROTHESIS PLATING AND BONEGRAFT  . APPENDECTOMY    . BIOPSY N/A 11/14/2014   Procedure: GASTRIC BIOPSY;  Surgeon: Daneil Dolin, MD;  Location: [facility];  Service: Endoscopy;  Laterality: N/A;  . CESAREAN SECTION    . CHOLECYSTECTOMY    .  COLONOSCOPY WITH PROPOFOL N/A 11/14/2014   RMR: redundant colon but otherwise normal  . ESOPHAGOGASTRODUODENOSCOPY (EGD) WITH PROPOFOL N/A 11/14/2014   RMR"s/p gastric surgery/anastomtic ulcer  . EYE SURGERY     bilateral cataracts removed, w/IOL  . GASTRIC BYPASS  2007  . JOINT REPLACEMENT     07/2010 &  2002- respectively- both knees   . PARATHYROIDECTOMY    . PERIPHERALLY INSERTED CENTRAL CATHETER INSERTION     PICC line for treatment of MRSA  . TONSILLECTOMY     as a child    Prior to Admission medications   Medication Sig Start Date End Date Taking? Authorizing Provider  ALPRAZolam Duanne Moron) 1 MG tablet Take 1 mg by mouth 4 (four) times daily.    Yes [provider]  Cyanocobalamin (VITAMIN B-12 IJ) Inject 1 Applicatorful as directed every 30 (thirty) days.    Yes [provider]  esomeprazole (NEXIUM) 40 MG capsule Take 40 mg by mouth daily.    Yes [provider]  HYDROcodone-acetaminophen (NORCO) 10-325 MG per tablet Take 1 tablet by mouth every 6 (six) hours as needed for moderate pain.  02/01/12  Yes [provider]  levothyroxine (SYNTHROID, LEVOTHROID) 75 MCG tablet Take 75 mcg by mouth daily before breakfast.   Yes [provider]  propranolol (INDERAL) 10 MG tablet Take 1 tablet (10 mg total) by mouth 3 (three) times daily. 05/09/12  Yes Darrol Jump, MD  venlafaxine (EFFEXOR) 75 MG tablet Take  75 mg by mouth 2 (two) times daily.    Yes [provider]  Vitamin D, Ergocalciferol, (DRISDOL) 50000 units CAPS capsule Take 50,000 Units by mouth every 7 (seven) days. Fridays   Yes [provider]    Allergies as of 07/27/2017 - Review Complete 06/09/2017  Allergen Reaction Noted  . Benzodiazepines Other (See Comments) 12/03/2011  . Codeine Nausea Only   . Escitalopram oxalate Nausea Only 12/02/2010  . Nsaids Other (See Comments) 12/22/2012  . Sertraline hcl Nausea Only   . Statins Nausea And Vomiting   .  Tramadol hcl Other (See Comments) 12/22/2012    Family History  Problem Relation Age of Onset  . Anxiety disorder Mother   . Anxiety disorder Sister   . Bipolar disorder Daughter   . OCD Daughter   . ADD / ADHD Daughter   . Heart attack Father   . Anesthesia problems Neg Hx   . Hypotension Neg Hx   . Malignant hyperthermia Neg Hx   . Pseudochol deficiency Neg Hx   . Dementia Neg Hx   . Alcohol abuse Neg Hx   . Drug abuse Neg Hx   . Depression Neg Hx   . Paranoid behavior Neg Hx   . Schizophrenia Neg Hx   . Seizures Neg Hx   . Sexual abuse Neg Hx   . Physical abuse Neg Hx   . Colon cancer Neg Hx     Social History   Socioeconomic History  . Marital status: Widowed    Spouse name: Not on file  . Number of children: Not on file  . Years of education: Not on file  . Highest education level: Not on file  Occupational History  . Not on file  Social Needs  . Financial resource strain: Not on file  . Food insecurity:    Worry: Not on file    Inability: Not on file  . Transportation needs:    Medical: Not on file    Non-medical: Not on file  Tobacco Use  . Smoking status: Former Smoker    Packs/day: 1.00    Years: 30.00    Pack years: 30.00    Types: Cigarettes    Last attempt to quit: 01/18/1998    Years since quitting: 19.6  . Smokeless tobacco: Never Used  Substance and Sexual Activity  . Alcohol use: No  . Drug use: No  . Sexual activity: Not Currently    Birth control/protection: Surgical  Lifestyle  . Physical activity:    Days per week: Not on file    Minutes per session: Not on file  . Stress: Not on file  Relationships  . Social connections:    Talks on phone: Not on file    Gets together: Not on file    Attends religious service: Not on file    Active member of club or organization: Not on file    Attends meetings of clubs or organizations: Not on file    Relationship status: Not on file  . Intimate partner violence:    Fear of current or ex  partner: Not on file    Emotionally abused: Not on file    Physically abused: Not on file    Forced sexual activity: Not on file  Other Topics Concern  . Not on file  Social History Narrative  . Not on file    Review of Systems: See HPI, otherwise negative ROS  Physical Exam: BP (!) (P) 175/90   Pulse  76   Temp 98.9 F (37.2 C) (Oral)   Resp 18   Ht 5\' 5"  (1.651 m)   Wt 83.4 kg   SpO2 98%   BMI 30.59 kg/m  General:   Alert, pleasant and cooperative in NAD Mouth:  No deformity or lesions. Neck:  Supple; no masses or thyromegaly. No significant cervical adenopathy. Lungs:  Clear throughout to auscultation.   No wheezes, crackles, or rhonchi. No acute distress. Heart:  Regular rate and rhythm; no murmurs, clicks, rubs,  or gallops. Abdomen: nondistended. Positive bowel soundsant epigastric taste palppreciable mass or organomegaly. No rebound.Pulses:  Normal pulses noted. Extremities:  Without clubbing or edema.  Impression/Plan: 72 year old lady with dysphagia and upper abdominal pain status post gastric bypass surgery. Worsening of abdominal pain recently.  Recommendations  We'll proceed with EGD with possible esophageal dilation. Further evaluation may be needed.  The risks, benefits, limitations, alternatives and imponderables have been reviewed with the patient. Potential for esophageal dilation, biopsy, etc. have also been reviewed.  Questions have been answered. All parties agreeable.    Notice: This dictation was prepared with Dragon dictation along with smaller phrase technology. Any transcriptional errors that result from this process are unintentional and may not be corrected upon review.

## 2017-09-15 NOTE — Transfer of Care (Signed)
Immediate Anesthesia Transfer of Care Note  Patient: [name]  Procedure(s) Performed: ESOPHAGOGASTRODUODENOSCOPY (EGD) WITH PROPOFOL (N/A ) MALONEY DILATION (N/A )  Patient Location: PACU  Anesthesia Type:General  Level of Consciousness: awake, alert  and patient cooperative  Airway & Oxygen Therapy: Patient Spontanous Breathing  Post-op Assessment: Report given to RN and Post -op Vital signs reviewed and stable  Post vital signs: Reviewed and stable  Last Vitals:  Vitals Value Taken Time  BP    Temp    Pulse 80 09/15/2017 11:59 AM  Resp 8 09/15/2017 11:59 AM  SpO2 97 % 09/15/2017 11:59 AM  Vitals shown include unvalidated device data.  Last Pain:  Vitals:   09/15/17 1141  TempSrc:   PainSc: 8       Patients Stated Pain Goal: 8 (59/97/74 1423)  Complications: No apparent anesthesia complications

## 2017-09-15 NOTE — Anesthesia Procedure Notes (Signed)
Procedure Name: MAC Date/Time: 09/15/2017 11:39 AM Performed by: Vista Deck, CRNA Pre-anesthesia Checklist: Patient identified, Emergency Drugs available, Suction available, Timeout performed and Patient being monitored Patient Re-evaluated:Patient Re-evaluated prior to induction Oxygen Delivery Method: Nasal Cannula

## 2017-09-16 ENCOUNTER — Encounter: Payer: Self-pay | Admitting: *Deleted

## 2017-09-16 DIAGNOSIS — R109 Unspecified abdominal pain: Secondary | ICD-10-CM

## 2017-09-20 ENCOUNTER — Encounter (HOSPITAL_COMMUNITY): Payer: Self-pay | Admitting: Internal Medicine

## 2017-10-13 ENCOUNTER — Ambulatory Visit: Payer: Medicare Other | Admitting: General Surgery

## 2017-10-20 ENCOUNTER — Encounter: Payer: Self-pay | Admitting: General Surgery

## 2017-10-20 ENCOUNTER — Ambulatory Visit: Payer: Medicare Other | Admitting: General Surgery

## 2017-10-20 ENCOUNTER — Ambulatory Visit (INDEPENDENT_AMBULATORY_CARE_PROVIDER_SITE_OTHER): Payer: Medicare Other | Admitting: General Surgery

## 2017-10-20 ENCOUNTER — Encounter (INDEPENDENT_AMBULATORY_CARE_PROVIDER_SITE_OTHER): Payer: Self-pay

## 2017-10-20 VITALS — BP 153/91 | HR 72 | Temp 98.4°F | Resp 20 | Wt 183.0 lb

## 2017-10-20 DIAGNOSIS — R1013 Epigastric pain: Secondary | ICD-10-CM

## 2017-10-20 NOTE — Progress Notes (Signed)
[name]; [id number]; 1945-09-13   HPI Patient is a 72 year old white female who was referred to my care by Dr. Lemmie Evens and [facility] for evaluation treatment of chronic epigastric pain.  Patient is status post gastric bypass surgery in the remote past.  She has had various episodes of nausea, dysphasia, and epigastric pain.  She has had multiple evaluations including EGD and CAT scans.  She apparently was in the hospital several months ago with nonspecific abdominal pain, though she had been on a significant amount of anxiolytics.  As reported by her daughter who the patient now lives with, she seems to have improved since she has been taken off the anxiolytics.  She is not complaining of nausea or abdominal pain like she was.  She states she has a 7 out of 10 abdominal pain.  When asked specifically where she hurts, she primarily points to the epigastric area. Past Medical History:  Diagnosis Date  . Anemia   . Anxiety   . Arthritis   . Blood transfusion    /w bleeding ulcer & post knee replacement- 2012  . Chronic kidney disease    h/o renal calculi, h/o lithotripsy  . Copper deficiency 06/17/2015  . Depression   . GERD (gastroesophageal reflux disease)    h/o bleeding ulcer, hosp., [facility]  . Headache(784.0)   . Hiatal hernia   . History of kidney stones   . Hypothyroidism   . Morbid obesity ([place])   . Neuralgia    spondylosis & stenosis  . Obsessive-compulsive disorder   . PONV (postoperative nausea and vomiting)   . Recurrent upper respiratory infection (URI)   . Shortness of breath    with exertion  . Sleep apnea    study- [facility], CPAP not used in 3 months, states she has a new machine  but hasn't used   . Thyroid disease     Past Surgical History:  Procedure Laterality Date  . ABDOMINAL HYSTERECTOMY    . ANTERIOR CERVICAL DECOMP/DISCECTOMY FUSION  12/09/2010   Procedure: ANTERIOR CERVICAL DECOMPRESSION/DISCECTOMY FUSION 3 LEVELS;  Surgeon: Ophelia Charter;  Location: [place] NEURO ORS;  Service: Neurosurgery;  Laterality: N/A;  Cervical three-four,Cervical four-five,Cervical Five-Six,Cervical Six-Seven ANTERIOR CERVICAL DECOMPRESSION WITH FUSION INTERBODY PROTHESIS PLATING AND BONEGRAFT  . APPENDECTOMY    . BIOPSY N/A 11/14/2014   Procedure: GASTRIC BIOPSY;  Surgeon: Daneil Dolin, MD;  Location: [facility];  Service: Endoscopy;  Laterality: N/A;  . CESAREAN SECTION    . CHOLECYSTECTOMY    . COLONOSCOPY WITH PROPOFOL N/A 11/14/2014   RMR: redundant colon but otherwise normal  . ESOPHAGOGASTRODUODENOSCOPY (EGD) WITH PROPOFOL N/A 11/14/2014   RMR"s/p gastric surgery/anastomtic ulcer  . ESOPHAGOGASTRODUODENOSCOPY (EGD) WITH PROPOFOL N/A 09/15/2017   Procedure: ESOPHAGOGASTRODUODENOSCOPY (EGD) WITH PROPOFOL;  Surgeon: Daneil Dolin, MD;  Location: [facility];  Service: Endoscopy;  Laterality: N/A;  11:30am  . EYE SURGERY     bilateral cataracts removed, w/IOL  . GASTRIC BYPASS  2007  . JOINT REPLACEMENT     07/2010 &  2002- respectively- both knees   . MALONEY DILATION N/A 09/15/2017   Procedure: Venia Minks DILATION;  Surgeon: Daneil Dolin, MD;  Location: [facility];  Service: Endoscopy;  Laterality: N/A;  . PARATHYROIDECTOMY    . PERIPHERALLY INSERTED CENTRAL CATHETER INSERTION     PICC line for treatment of MRSA  . TONSILLECTOMY     as a child    Family History  Problem Relation Age of  Onset  . Anxiety disorder Mother   . Anxiety disorder Sister   . Bipolar disorder Daughter   . OCD Daughter   . ADD / ADHD Daughter   . Heart attack Father   . Anesthesia problems Neg Hx   . Hypotension Neg Hx   . Malignant hyperthermia Neg Hx   . Pseudochol deficiency Neg Hx   . Dementia Neg Hx   . Alcohol abuse Neg Hx   . Drug abuse Neg Hx   . Depression Neg Hx   . Paranoid behavior Neg Hx   . Schizophrenia Neg Hx   . Seizures Neg Hx   . Sexual abuse Neg Hx   . Physical abuse Neg Hx   . Colon cancer Neg Hx     Current Outpatient  Medications on File Prior to Visit  Medication Sig Dispense Refill  . Cyanocobalamin (VITAMIN B-12 IJ) Inject 1 Applicatorful as directed every 30 (thirty) days.     [name] levothyroxine (SYNTHROID, LEVOTHROID) 75 MCG tablet Take 75 mcg by mouth daily before breakfast.    . propranolol (INDERAL) 10 MG tablet Take 1 tablet (10 mg total) by mouth 3 (three) times daily. 90 tablet 1  . venlafaxine (EFFEXOR) 75 MG tablet Take 75 mg by mouth 2 (two) times daily.     . Vitamin D, Ergocalciferol, (DRISDOL) 50000 units CAPS capsule Take 50,000 Units by mouth every 7 (seven) days. Fridays     No current facility-administered medications on file prior to visit.     Allergies  Allergen Reactions  . Benzodiazepines Other (See Comments)    Cloudy thinking, memory loss, withdrawal symptoms when trying to stop without any other medication for detox.   . Codeine Nausea Only  . Escitalopram Oxalate Nausea Only  . Nsaids Other (See Comments)    Bleeding ulcer   . Sertraline Hcl Nausea Only  . Statins Nausea And Vomiting  . Tramadol Hcl Other (See Comments)    Daughter doesn't want mother to take due to the fact that the medication made the daughter have a seizure     Social History   Substance and Sexual Activity  Alcohol Use No    Social History   Tobacco Use  Smoking Status Former Smoker  . Packs/day: 1.00  . Years: 30.00  . Pack years: 30.00  . Types: Cigarettes  . Last attempt to quit: 01/18/1998  . Years since quitting: 19.7  Smokeless Tobacco Never Used    Review of Systems  Constitutional: Negative.   HENT: Negative.   Eyes: Negative.   Respiratory: Negative.   Cardiovascular: Negative.   Gastrointestinal: Positive for abdominal pain, heartburn and nausea.  Genitourinary: Negative.   Musculoskeletal: Positive for back pain, joint pain and neck pain.  Skin: Negative.   Neurological: Negative.   Endo/Heme/Allergies: Negative.   Psychiatric/Behavioral: Positive for depression. The  patient is nervous/anxious.     Objective   Vitals:   10/20/17 1139  BP: (!) 153/91  Pulse: 72  Resp: 20  Temp: 98.4 F (36.9 C)    Physical Exam  Constitutional: She is oriented to person, place, and time. She appears well-developed and well-nourished. She does not appear ill. No distress.  HENT:  Head: Normocephalic and atraumatic.  Abdominal: Soft. Normal appearance and bowel sounds are normal. There is no rigidity and no guarding. A hernia is present. Hernia confirmed positive in the ventral area.  She does have some point tenderness along an upper midline surgical scar.  I do not appreciate  a definitive hernia at that site.  She does have a white spit Gallion hernia which is indistinct but seems to be reducible.  When palpating her abdomen, her tenderness seemed to be very nonspecific and migratory in nature.  Neurological: She is alert and oriented to person, place, and time.  Skin: Skin is warm and dry.  Vitals reviewed.  Dr. Roseanne Kaufman notes reviewed, previous admission notes reviewed Assessment  Abdominal pain of unknown etiology.  I suspect this is multifactorial in nature.  I do not appreciate any specific surgical issue that will explain her constellation of symptoms.  She does have a right spigelian hernia which is asymptomatic. Plan   I had extensive discussion with both the patient and daughter.  The daughter states that the patient is much improved since she is off her anxiolytics.  I told them that I did not feel any surgical intervention would be helpful at this time.  They understand and agreed.  They will follow-up with me should she have new or or more increasing symptoms.  They will follow-up with me expectantly.

## 2018-02-02 ENCOUNTER — Other Ambulatory Visit (HOSPITAL_COMMUNITY): Payer: Self-pay | Admitting: Nurse Practitioner

## 2018-02-02 DIAGNOSIS — Z1231 Encounter for screening mammogram for malignant neoplasm of breast: Secondary | ICD-10-CM

## 2019-02-01 ENCOUNTER — Other Ambulatory Visit: Payer: Self-pay

## 2019-02-02 ENCOUNTER — Other Ambulatory Visit: Payer: Self-pay

## 2019-02-02 ENCOUNTER — Ambulatory Visit: Payer: Medicare Other | Attending: Internal Medicine

## 2019-02-02 DIAGNOSIS — Z20822 Contact with and (suspected) exposure to covid-19: Secondary | ICD-10-CM

## 2019-02-02 DIAGNOSIS — U071 COVID-19: Secondary | ICD-10-CM | POA: Insufficient documentation

## 2019-02-03 LAB — NOVEL CORONAVIRUS, NAA: SARS-CoV-2, NAA: DETECTED — AB

## 2019-02-04 ENCOUNTER — Telehealth: Payer: Self-pay | Admitting: Nurse Practitioner

## 2019-02-04 NOTE — Telephone Encounter (Signed)
Called to Discuss with patient about Covid symptoms and the use of bamlanivimab, a monoclonal antibody infusion for those with mild to moderate Covid symptoms and at a high risk of hospitalization.     Pt is qualified for this infusion at the [facility] infusion center due to co-morbid conditions and/or a member of an at-risk group.     Unable to reach pt. Left message to return call.

## 2019-03-01 ENCOUNTER — Other Ambulatory Visit: Payer: Self-pay

## 2019-03-01 ENCOUNTER — Encounter: Payer: Self-pay | Admitting: Nurse Practitioner

## 2019-03-01 ENCOUNTER — Ambulatory Visit (INDEPENDENT_AMBULATORY_CARE_PROVIDER_SITE_OTHER): Payer: Medicare Other | Admitting: Nurse Practitioner

## 2019-03-01 ENCOUNTER — Other Ambulatory Visit (HOSPITAL_COMMUNITY)
Admission: RE | Admit: 2019-03-01 | Discharge: 2019-03-01 | Disposition: A | Discharge status: Home / Self Care | Payer: Medicare Other | Source: Ambulatory Visit | Attending: Nurse Practitioner | Admitting: Nurse Practitioner

## 2019-03-01 ENCOUNTER — Ambulatory Visit (HOSPITAL_COMMUNITY)
Admission: RE | Admit: 2019-03-01 | Discharge: 2019-03-01 | Disposition: A | Discharge status: Home / Self Care | Payer: Medicare Other | Source: Ambulatory Visit | Attending: Nurse Practitioner | Admitting: Nurse Practitioner

## 2019-03-01 ENCOUNTER — Other Ambulatory Visit: Payer: Self-pay | Admitting: Nurse Practitioner

## 2019-03-01 ENCOUNTER — Telehealth: Payer: Self-pay

## 2019-03-01 VITALS — BP 143/75 | HR 93 | Temp 96.6°F | Ht 65.0 in | Wt 218.2 lb

## 2019-03-01 DIAGNOSIS — R197 Diarrhea, unspecified: Secondary | ICD-10-CM | POA: Diagnosis present

## 2019-03-01 DIAGNOSIS — R112 Nausea with vomiting, unspecified: Secondary | ICD-10-CM

## 2019-03-01 DIAGNOSIS — R1084 Generalized abdominal pain: Secondary | ICD-10-CM | POA: Diagnosis present

## 2019-03-01 LAB — LIPASE, BLOOD: Lipase: 27 U/L (ref 11–51)

## 2019-03-01 LAB — CBC WITH DIFFERENTIAL/PLATELET
Abs Immature Granulocytes: 0.02 10*3/uL (ref 0.00–0.07)
Basophils Absolute: 0.1 10*3/uL (ref 0.0–0.1)
Basophils Relative: 1 %
Eosinophils Absolute: 0.1 10*3/uL (ref 0.0–0.5)
Eosinophils Relative: 1 %
HCT: 35.6 % — ABNORMAL LOW (ref 36.0–46.0)
Hemoglobin: 10.5 g/dL — ABNORMAL LOW (ref 12.0–15.0)
Immature Granulocytes: 0 %
Lymphocytes Relative: 25 %
Lymphs Abs: 2.4 10*3/uL (ref 0.7–4.0)
MCH: 26.4 pg (ref 26.0–34.0)
MCHC: 29.5 g/dL — ABNORMAL LOW (ref 30.0–36.0)
MCV: 89.4 fL (ref 80.0–100.0)
Monocytes Absolute: 0.7 10*3/uL (ref 0.1–1.0)
Monocytes Relative: 7 %
Neutro Abs: 6.3 10*3/uL (ref 1.7–7.7)
Neutrophils Relative %: 66 %
Platelets: 239 10*3/uL (ref 150–400)
RBC: 3.98 MIL/uL (ref 3.87–5.11)
RDW: 16.7 % — ABNORMAL HIGH (ref 11.5–15.5)
WBC: 9.5 10*3/uL (ref 4.0–10.5)
nRBC: 0 % (ref 0.0–0.2)

## 2019-03-01 LAB — COMPREHENSIVE METABOLIC PANEL
ALT: 12 U/L (ref 0–44)
AST: 17 U/L (ref 15–41)
Albumin: 3.7 g/dL (ref 3.5–5.0)
Alkaline Phosphatase: 76 U/L (ref 38–126)
Anion gap: 7 (ref 5–15)
BUN: 31 mg/dL — ABNORMAL HIGH (ref 8–23)
CO2: 26 mmol/L (ref 22–32)
Calcium: 9.3 mg/dL (ref 8.9–10.3)
Chloride: 102 mmol/L (ref 98–111)
Creatinine, Ser: 1.3 mg/dL — ABNORMAL HIGH (ref 0.44–1.00)
GFR calc Af Amer: 47 mL/min — ABNORMAL LOW (ref >60)
GFR calc non Af Amer: 41 mL/min — ABNORMAL LOW (ref >60)
Glucose, Bld: 105 mg/dL — ABNORMAL HIGH (ref 70–99)
Potassium: 4.8 mmol/L (ref 3.5–5.1)
Sodium: 135 mmol/L (ref 135–145)
Total Bilirubin: 0.7 mg/dL (ref 0.3–1.2)
Total Protein: 6.9 g/dL (ref 6.5–8.1)

## 2019-03-01 MED ORDER — IOHEXOL 300 MG/ML  SOLN
75.0000 mL | Freq: Once | INTRAMUSCULAR | Status: AC | PRN
  Administered 2019-03-01: 75 mL via INTRAVENOUS

## 2019-03-01 MED ORDER — OXYCODONE-ACETAMINOPHEN 5-325 MG PO TABS: 1.0000 | ORAL_TABLET | Freq: Three times a day (TID) | ORAL | 0 refills | Status: DC | PRN

## 2019-03-01 NOTE — Patient Instructions (Signed)
Your health issues we discussed today were:   Nausea, vomiting, diarrhea: 1. Start taking a probiotic. 2. There are multiple options available over-the-counter.  You can ask the pharmacist for assistance if needed 3. Some brands we have had success with include Restora, Align, [place] 4. Try to take this for 1 to 2 months to see if it helps your symptoms  Abdominal pain: 1. Start taking Nexium as soon as your primary care provider is able to get it approved 2. Have your labs drawn as soon as you can 3. We will schedule your CT scan for you.  It should be done today. 4. I am giving you a limited supply of pain medications to help with your abdominal pain until we can get some results back 5. Call us if you have any worsening or severe pain.  If the pain becomes severe or you develop a fever, inability to keep down food or fluids and proceed to the emergency room  Overall I recommend:  1. Continue your other current medications 2. Return for follow-up in 4 weeks 3. Call us if you have any questions or concerns   ---------------------------------------------------------------  COVID-19 Vaccine Information can be found at: ShippingScam.co.uk For questions related to vaccine distribution or appointments, please email [email or web address] or call [phone].    ---------------------------------------------------------------   At [facility] we value your feedback. You may receive a survey about your visit today. Please share your experience as we strive to create trusting relationships with our patients to provide genuine, compassionate, quality care.  We appreciate your understanding and patience as we review any laboratory studies, imaging, and other diagnostic tests that are ordered as we care for you. Our office policy is 5 business days for review of these results, and any emergent or urgent  results are addressed in a timely manner for your best interest. If you do not hear from our office in 1 week, please contact us.   We also encourage the use of MyChart, which contains your medical information for your review as well. If you are not enrolled in this feature, an access code is on this after visit summary for your convenience. Thank you for allowing [place] to be involved in your care.  It was great to see you today!  I hope you have a great day!!

## 2019-03-01 NOTE — Progress Notes (Signed)
Referring Provider: Lemmie Evens, MD Primary Care Physician:  Lemmie Evens, MD Primary GI:  Dr. Gala Romney  Chief Complaint  Patient presents with  . Abdominal Pain    all over, since had COVID last month    HPI:   [name] is a 74 y.o. female who presents for follow-up on stomach burning. The patient was last seen in our office 06/08/2017 for dysphagia, GERD, abdominal pain, LLQ pain, and odynophagia. History of gastric bypass 2007. History of IDA felt to be secondary to gastric bypass vs. Friable anastomosis.  History of anastomotic ulcer on EGD in October 2016 with unremarkable biopsies.  Colonoscopy at the same time unremarkable.  Remote work-up for IDA and heme positive stools in 2011 including EGD and colonoscopy.  EGD in 2011 showed healed gastric ulcer and colonoscopy in March 2011 with redundant colon to sigmoid polyps found to be hyperplastic.  Capsule endoscopy in September 2011 the small bowel loops dilated and a few erosions but nothing to explain her pain.  At her last visit she was having an irritated esophagus feeling that was described as "greasy and like sand".  Some solid food dysphagia with odynophagia and occasional regurgitation of "snowballs" the size of a golf ball which were soft and mushy.  When she is on pain medication she typically has intermittent constipation, otherwise her stools are typically loose.  At her last visit she was not on NSAIDs.  Generic versus name brand Nexium for over 3 years.  Typically feels like name brand Nexium is more effective than the generic.  Also left lower quadrant abdominal pain with a history of diverticulosis.  Recommended CT scan, EGD.  CT of the abdomen and pelvis was completed 08/15/2017 which found post gastric bypass with mild dilation of small bowel anastomosis seen on previous exams, no obvious ulceration.  Stable mild intrahepatic/extrahepatic biliary ductal prominence may be related to status post cholecystectomy state  without CBD stone noted.  A 7 by some 4 x 3 mm nonspecific low-density in the right lobe of the liver which was felt to be present previously.  Small fat-containing right spigelian hernia without significant change.  Labs completed 09/15/2017, normal CBC, CMP with mild hypokalemia 2.9 and normal creatinine.  Lipase was normal.  EGD completed 09/15/2017 which found normal esophagus status post prior gastric bypass, otherwise unremarkable.  Continue Nexium.  Recommended CMP, CBC, lipase and surgical consultation.  The patient saw surgery 10/20/2017 they felt her spigelian hernia was generally asymptomatic and her abdominal pain was migratory and multifactorial in nature.  The patient's daughter felt that she was doing much better off of her anxiolytics.  They did not recommend any surgical intervention.  Today she states she's doing ok overall. Having generalized abdominal pain since testing positive for COVID a month ago. When she was first diagnosed she had headaches, sore throat, dyspnea. Since then she has had upset stomach/stomach pain. Otherwise other symptoms have improved other than loss of taste. Ran out of Nexium a few days ago and is waiting for PCP to appeal name-brand only request. Stomach gives her trouble "all the time" but worse since COVID. Pain is generalized abdomen, thinks its related to her hernia(s). Described as burning and "feels like I'm being jabbed with a screwdriver." Also with N/V, diarrhea. Last episode of emesis was last night. Decreased appetite. Also with diarrhea which is intermittent, over the past 6-7 months. About 6-7 months ago she had episode of persistent diarrhea, N/V. States she has pain  medication, but her daughter doesn't like pain medication and tells people her mom is a pill addict. Denies hematochezia, melena, fever, chills, unintentional weight loss. Denies chest pain, dyspnea, dizziness, lightheadedness, syncope, near syncope. Denies any other upper or lower GI  symptoms.  Denies NSAIDs and ASA. Only uses Tylenol.  Past Medical History:  Diagnosis Date  . Anemia   . Anxiety   . Arthritis   . Blood transfusion    /w bleeding ulcer & post knee replacement- 2012  . Chronic kidney disease    h/o renal calculi, h/o lithotripsy  . Copper deficiency 06/17/2015  . Depression   . GERD (gastroesophageal reflux disease)    h/o bleeding ulcer, hosp., [facility]  . Headache(784.0)   . Hiatal hernia   . History of kidney stones   . Hypothyroidism   . Morbid obesity ([place])   . Neuralgia    spondylosis & stenosis  . Obsessive-compulsive disorder   . PONV (postoperative nausea and vomiting)   . Recurrent upper respiratory infection (URI)   . Shortness of breath    with exertion  . Sleep apnea    [facility] CPAP not used in 3 months, states she has a new machine  but hasn't used   . Thyroid disease     Past Surgical History:  Procedure Laterality Date  . ABDOMINAL HYSTERECTOMY    . ANTERIOR CERVICAL DECOMP/DISCECTOMY FUSION  12/09/2010   Procedure: ANTERIOR CERVICAL DECOMPRESSION/DISCECTOMY FUSION 3 LEVELS;  Surgeon: Ophelia Charter;  Location: [place] [facility];  Service: Neurosurgery;  Laterality: N/A;  Cervical three-four,Cervical four-five,Cervical Five-Six,Cervical Six-Seven ANTERIOR CERVICAL DECOMPRESSION WITH FUSION INTERBODY PROTHESIS PLATING AND BONEGRAFT  . APPENDECTOMY    . BIOPSY N/A 11/14/2014   Procedure: GASTRIC BIOPSY;  Surgeon: Daneil Dolin, MD;  Location: [facility];  Service: Endoscopy;  Laterality: N/A;  . CESAREAN SECTION    . CHOLECYSTECTOMY    . COLONOSCOPY WITH PROPOFOL N/A 11/14/2014   RMR: redundant colon but otherwise normal  . ESOPHAGOGASTRODUODENOSCOPY (EGD) WITH PROPOFOL N/A 11/14/2014   RMR"s/p gastric surgery/anastomtic ulcer  . ESOPHAGOGASTRODUODENOSCOPY (EGD) WITH PROPOFOL N/A 09/15/2017   Procedure: ESOPHAGOGASTRODUODENOSCOPY (EGD) WITH PROPOFOL;  Surgeon: Daneil Dolin, MD;  Location: [facility];  Service: Endoscopy;  Laterality: N/A;  11:30am  . EYE SURGERY     bilateral cataracts removed, w/IOL  . GASTRIC BYPASS  2007  . JOINT REPLACEMENT     07/2010 &  2002- respectively- both knees   . MALONEY DILATION N/A 09/15/2017   Procedure: Venia Minks DILATION;  Surgeon: Daneil Dolin, MD;  Location: [facility];  Service: Endoscopy;  Laterality: N/A;  . PARATHYROIDECTOMY    . PERIPHERALLY INSERTED CENTRAL CATHETER INSERTION     PICC line for treatment of MRSA  . TONSILLECTOMY     as a child    Current Outpatient Medications  Medication Sig Dispense Refill  . Cyanocobalamin (VITAMIN B-12 IJ) Inject 1 Applicatorful as directed every 30 (thirty) days.     . furosemide (LASIX) 40 MG tablet Take 40 mg by mouth as needed.    [name] levothyroxine (SYNTHROID, LEVOTHROID) 75 MCG tablet Take 75 mcg by mouth daily before breakfast.    . venlafaxine (EFFEXOR) 75 MG tablet Take 75 mg by mouth 2 (two) times daily.     . Vitamin D, Ergocalciferol, (DRISDOL) 50000 units CAPS capsule Take 50,000 Units by mouth every 7 (seven) days. Fridays     No current facility-administered medications for this visit.  Allergies as of 03/01/2019 - Review Complete 03/01/2019  Allergen Reaction Noted  . Benzodiazepines Other (See Comments) 12/03/2011  . Codeine Nausea Only   . Escitalopram oxalate Nausea Only 12/02/2010  . Nsaids Other (See Comments) 12/22/2012  . Sertraline hcl Nausea Only   . Statins Nausea And Vomiting   . Tramadol hcl Other (See Comments) 12/22/2012    Family History  Problem Relation Age of Onset  . Anxiety disorder Mother   . Anxiety disorder Sister   . Bipolar disorder Daughter   . OCD Daughter   . ADD / ADHD Daughter   . Heart attack Father   . Anesthesia problems Neg Hx   . Hypotension Neg Hx   . Malignant hyperthermia Neg Hx   . Pseudochol deficiency Neg Hx   . Dementia Neg Hx   . Alcohol abuse Neg Hx   . Drug abuse Neg Hx   . Depression Neg Hx   . Paranoid behavior  Neg Hx   . Schizophrenia Neg Hx   . Seizures Neg Hx   . Sexual abuse Neg Hx   . Physical abuse Neg Hx   . Colon cancer Neg Hx     Social History   Socioeconomic History  . Marital status: Widowed    Spouse name: Not on file  . Number of children: Not on file  . Years of education: Not on file  . Highest education level: Not on file  Occupational History  . Not on file  Tobacco Use  . Smoking status: Former Smoker    Packs/day: 1.00    Years: 30.00    Pack years: 30.00    Types: Cigarettes    Quit date: 01/18/1998    Years since quitting: 21.1  . Smokeless tobacco: Never Used  Substance and Sexual Activity  . Alcohol use: No  . Drug use: No  . Sexual activity: Not Currently    Birth control/protection: Surgical  Other Topics Concern  . Not on file  Social History Narrative  . Not on file   Social Determinants of Health   Financial Resource Strain:   . Difficulty of Paying Living Expenses: Not on file  Food Insecurity:   . Worried About Charity fundraiser in the Last Year: Not on file  . Ran Out of Food in the Last Year: Not on file  Transportation Needs:   . Lack of Transportation (Medical): Not on file  . Lack of Transportation (Non-Medical): Not on file  Physical Activity:   . Days of Exercise per Week: Not on file  . Minutes of Exercise per Session: Not on file  Stress:   . Feeling of Stress : Not on file  Social Connections:   . Frequency of Communication with Friends and Family: Not on file  . Frequency of Social Gatherings with Friends and Family: Not on file  . Attends Religious Services: Not on file  . Active Member of Clubs or Organizations: Not on file  . Attends Archivist Meetings: Not on file  . Marital Status: Not on file    Review of Systems: General: Negative for anorexia, weight loss, fever, chills, fatigue, weakness. ENT: Negative for hoarseness, difficulty swallowing. CV: Negative for chest pain, angina, palpitations,  peripheral edema.  Respiratory: Negative for dyspnea at rest, cough, sputum, wheezing.  GI: See history of present illness. Endo: Negative for unusual weight change.  Heme: Negative for bruising or bleeding. Allergy: Negative for rash or hives.   Physical Exam: BP [name])  143/75   Pulse 93   Temp (!) 96.6 F (35.9 C) (Temporal)   Ht 5\' 5"  (1.651 m)   Wt 218 lb 3.2 oz (99 kg)   BMI 36.31 kg/m  General:   Alert and oriented. Pleasant and cooperative. Well-nourished and well-developed. She appears quite uncomfortable. Head:  Normocephalic and atraumatic. Eyes:  Without icterus, sclera clear and conjunctiva pink.  Ears:  Normal auditory acuity. Cardiovascular:  S1, S2 present without murmurs appreciated. Extremities without clubbing or edema. Respiratory:  Clear to auscultation bilaterally. No wheezes, rales, or rhonchi. No distress.  Gastrointestinal:  +BS, soft, non-tender and non-distended. No HSM noted. No guarding or rebound. No masses appreciated.  Rectal:  Deferred  Musculoskalatal:  Symmetrical without gross deformities. Neurologic:  Alert and oriented x4;  grossly normal neurologically. Psych:  Alert and cooperative. Normal mood and affect. Heme/Lymph/Immune: No excessive bruising noted.    03/01/2019 8:41 AM   Disclaimer: This note was dictated with voice recognition software. Similar sounding words can inadvertently be transcribed and may not be corrected upon review.

## 2019-03-01 NOTE — Telephone Encounter (Signed)
T/C from [place] at [facility] with call report on CT of abdomen and pelvis ordered by Walden Field, NP.   Impression:  1. No acute abnormality  2. Moderate colonic stool burden  Forwarding to Walden Field, NP to address.

## 2019-03-06 ENCOUNTER — Telehealth: Payer: Self-pay | Admitting: Internal Medicine

## 2019-03-06 NOTE — Telephone Encounter (Signed)
Pt had CT on Thursday last week and was asking about results. She is living with her daughter now and to call her number at [phone]

## 2019-03-06 NOTE — Telephone Encounter (Signed)
Pt is inquiring about her imaging results. Lmom, pt is aware results aren't available at this time.

## 2019-03-07 NOTE — Assessment & Plan Note (Addendum)
She has been having diarrhea as of late.  She had a bout of persistent diarrhea 6 to 7 months ago.  Her abdominal symptoms seem to be worsened since she was diagnosed with COVID-19.  At this point I will have her start a probiotic.  We will check labs and a stat CT.  Follow-up in 4 weeks.  Further recommendations to follow.

## 2019-03-07 NOTE — Assessment & Plan Note (Signed)
The patient describes nausea with vomiting as well as diarrhea.  She is awaiting PCP to refill Nexium brand only.  I will check labs including CBC, CMP, lipase.  Stat CT as well due to nausea and vomiting as well as abdominal pain.  Call for any worsening or severe symptoms.  We could consider sending in antiemetic if needed.  Follow-up in 4 weeks.

## 2019-03-07 NOTE — Assessment & Plan Note (Signed)
Chronic abdominal pain, history of abdominal hernia.  She describes significant abdominal pain "like in the job of the screwdriver."  She appears quite uncomfortable today.  Given the severity of her symptoms I put in an order for stat CT as well as labs including CBC, CMP, lipase.  I am giving her a limited supply of narcotic pain medication after reviewing the state substance abuse website and she is deemed not a high risk overdose patient.  Continue Nexium when able to be refilled by primary care.  Probiotic as well.  Follow-up in 4 weeks.  Further recommendations to follow, call for any worsening or severe symptoms.

## 2019-03-09 NOTE — Telephone Encounter (Signed)
See result note previously completed.

## 2019-03-13 ENCOUNTER — Other Ambulatory Visit: Payer: Self-pay | Admitting: Neurosurgery

## 2019-03-13 ENCOUNTER — Telehealth: Payer: Self-pay | Admitting: Internal Medicine

## 2019-03-13 ENCOUNTER — Other Ambulatory Visit (HOSPITAL_COMMUNITY): Payer: Self-pay | Admitting: Neurosurgery

## 2019-03-13 DIAGNOSIS — M542 Cervicalgia: Secondary | ICD-10-CM

## 2019-03-13 DIAGNOSIS — G8929 Other chronic pain: Secondary | ICD-10-CM

## 2019-03-13 NOTE — Telephone Encounter (Signed)
Message sent to provider. See result note.

## 2019-03-13 NOTE — Telephone Encounter (Signed)
Pt's daughter calling for CT results and requesting a refill for something for pain. Please advise.  [phone]

## 2019-03-26 ENCOUNTER — Telehealth: Payer: Self-pay | Admitting: Internal Medicine

## 2019-03-26 MED ORDER — ONDANSETRON HCL 4 MG PO TABS: 4.0000 mg | ORAL_TABLET | Freq: Three times a day (TID) | ORAL | 1 refills | Status: DC | PRN

## 2019-03-26 NOTE — Addendum Note (Signed)
Addended by: Annitta Needs on: 03/26/2019 02:53 PM   Modules accepted: Orders

## 2019-03-26 NOTE — Telephone Encounter (Signed)
Spoke with pt. Pt was asked to discuss pain with PCP if she continued to have it all over her body per EG (see results note). Pt is requesting some nausea medication. Pt states that she feels nauseated all the time and feels she needs an MRI of her abdomen. Pt says her rt upper abdomen near her ribs hurts constantly and she has a nodule that can be seen near her rib that disappears. Pts pain level gets to an 8. Pt is scheduled for an MRI of her back which was scheduled by her PCP. Please advise in the absence of EG.

## 2019-03-26 NOTE — Telephone Encounter (Signed)
Spoke with pt. Pt is aware that Zofran was sent to her pharmacy and AB will discuss further about possible MRI.

## 2019-03-26 NOTE — Telephone Encounter (Signed)
Tried calling pt. VM isn't set up. Will call pt back.  

## 2019-03-26 NOTE — Telephone Encounter (Signed)
Tried calling pt

## 2019-03-26 NOTE — Telephone Encounter (Signed)
Patient called again, wanting something sent to her pharmacy for nausea [phone]

## 2019-03-26 NOTE — Telephone Encounter (Signed)
Zofran sent to pharmacy for nausea. I reviewed most recent LFTs, which were normal. Chronic post-cholecystectomy bile duct prominence. If having RUQ pain specifically, would consider MRI but will let Randall Hiss provide further recommendations.  If she is having periumbilical pain after eating, fear of eating, etc., I would be considered about an internal hernia in setting of post-gastric bypass state. These are hard to catch on CT imaging.   Further recommendations per Randall Hiss.

## 2019-04-02 ENCOUNTER — Telehealth: Payer: Self-pay | Admitting: Neurosurgery

## 2019-04-03 ENCOUNTER — Emergency Department (HOSPITAL_COMMUNITY): Payer: Medicare Other

## 2019-04-03 ENCOUNTER — Inpatient Hospital Stay (HOSPITAL_COMMUNITY)
Admission: EM | Admit: 2019-04-03 | Discharge: 2019-04-05 | DRG: 382 | Disposition: A | Discharge status: Home / Self Care | Payer: Medicare Other | Attending: Internal Medicine | Admitting: Internal Medicine

## 2019-04-03 ENCOUNTER — Encounter: Payer: Self-pay | Admitting: Internal Medicine

## 2019-04-03 ENCOUNTER — Ambulatory Visit (INDEPENDENT_AMBULATORY_CARE_PROVIDER_SITE_OTHER): Payer: Medicare Other | Admitting: Internal Medicine

## 2019-04-03 ENCOUNTER — Other Ambulatory Visit: Payer: Self-pay

## 2019-04-03 ENCOUNTER — Encounter (HOSPITAL_COMMUNITY): Payer: Self-pay | Admitting: Emergency Medicine

## 2019-04-03 VITALS — BP 188/100 | HR 96 | Temp 97.1°F | Ht 65.0 in | Wt 219.6 lb

## 2019-04-03 DIAGNOSIS — K439 Ventral hernia without obstruction or gangrene: Secondary | ICD-10-CM | POA: Diagnosis present

## 2019-04-03 DIAGNOSIS — R1013 Epigastric pain: Secondary | ICD-10-CM | POA: Diagnosis not present

## 2019-04-03 DIAGNOSIS — Z87891 Personal history of nicotine dependence: Secondary | ICD-10-CM

## 2019-04-03 DIAGNOSIS — R1011 Right upper quadrant pain: Secondary | ICD-10-CM

## 2019-04-03 DIAGNOSIS — Z8616 Personal history of COVID-19: Secondary | ICD-10-CM

## 2019-04-03 DIAGNOSIS — Z818 Family history of other mental and behavioral disorders: Secondary | ICD-10-CM

## 2019-04-03 DIAGNOSIS — Z8249 Family history of ischemic heart disease and other diseases of the circulatory system: Secondary | ICD-10-CM

## 2019-04-03 DIAGNOSIS — I129 Hypertensive chronic kidney disease with stage 1 through stage 4 chronic kidney disease, or unspecified chronic kidney disease: Secondary | ICD-10-CM | POA: Diagnosis present

## 2019-04-03 DIAGNOSIS — E039 Hypothyroidism, unspecified: Secondary | ICD-10-CM | POA: Diagnosis present

## 2019-04-03 DIAGNOSIS — Z888 Allergy status to other drugs, medicaments and biological substances status: Secondary | ICD-10-CM

## 2019-04-03 DIAGNOSIS — Z886 Allergy status to analgesic agent status: Secondary | ICD-10-CM

## 2019-04-03 DIAGNOSIS — F418 Other specified anxiety disorders: Secondary | ICD-10-CM | POA: Diagnosis present

## 2019-04-03 DIAGNOSIS — E7800 Pure hypercholesterolemia, unspecified: Secondary | ICD-10-CM | POA: Diagnosis present

## 2019-04-03 DIAGNOSIS — F1990 Other psychoactive substance use, unspecified, uncomplicated: Secondary | ICD-10-CM | POA: Diagnosis not present

## 2019-04-03 DIAGNOSIS — D649 Anemia, unspecified: Secondary | ICD-10-CM

## 2019-04-03 DIAGNOSIS — Z20822 Contact with and (suspected) exposure to covid-19: Secondary | ICD-10-CM | POA: Diagnosis present

## 2019-04-03 DIAGNOSIS — K5909 Other constipation: Secondary | ICD-10-CM | POA: Diagnosis present

## 2019-04-03 DIAGNOSIS — R112 Nausea with vomiting, unspecified: Secondary | ICD-10-CM

## 2019-04-03 DIAGNOSIS — Z96653 Presence of artificial knee joint, bilateral: Secondary | ICD-10-CM | POA: Diagnosis present

## 2019-04-03 DIAGNOSIS — Z885 Allergy status to narcotic agent status: Secondary | ICD-10-CM

## 2019-04-03 DIAGNOSIS — K449 Diaphragmatic hernia without obstruction or gangrene: Secondary | ICD-10-CM | POA: Diagnosis present

## 2019-04-03 DIAGNOSIS — K289 Gastrojejunal ulcer, unspecified as acute or chronic, without hemorrhage or perforation: Secondary | ICD-10-CM | POA: Diagnosis not present

## 2019-04-03 DIAGNOSIS — R109 Unspecified abdominal pain: Secondary | ICD-10-CM

## 2019-04-03 DIAGNOSIS — K219 Gastro-esophageal reflux disease without esophagitis: Secondary | ICD-10-CM | POA: Diagnosis present

## 2019-04-03 DIAGNOSIS — Z981 Arthrodesis status: Secondary | ICD-10-CM

## 2019-04-03 DIAGNOSIS — G4730 Sleep apnea, unspecified: Secondary | ICD-10-CM | POA: Diagnosis present

## 2019-04-03 DIAGNOSIS — N183 Chronic kidney disease, stage 3 unspecified: Secondary | ICD-10-CM | POA: Diagnosis present

## 2019-04-03 DIAGNOSIS — Z6835 Body mass index (BMI) 35.0-35.9, adult: Secondary | ICD-10-CM

## 2019-04-03 DIAGNOSIS — Z9884 Bariatric surgery status: Secondary | ICD-10-CM

## 2019-04-03 DIAGNOSIS — R1084 Generalized abdominal pain: Secondary | ICD-10-CM

## 2019-04-03 DIAGNOSIS — E669 Obesity, unspecified: Secondary | ICD-10-CM | POA: Diagnosis present

## 2019-04-03 DIAGNOSIS — L732 Hidradenitis suppurativa: Secondary | ICD-10-CM | POA: Diagnosis present

## 2019-04-03 DIAGNOSIS — R197 Diarrhea, unspecified: Secondary | ICD-10-CM

## 2019-04-03 LAB — URINALYSIS, ROUTINE W REFLEX MICROSCOPIC
Bilirubin Urine: NEGATIVE
Glucose, UA: NEGATIVE mg/dL
Hgb urine dipstick: NEGATIVE
Ketones, ur: NEGATIVE mg/dL
Nitrite: NEGATIVE
Protein, ur: NEGATIVE mg/dL
Specific Gravity, Urine: 1.016 (ref 1.005–1.030)
pH: 8 (ref 5.0–8.0)

## 2019-04-03 LAB — CBC WITH DIFFERENTIAL/PLATELET
Abs Immature Granulocytes: 0.03 10*3/uL (ref 0.00–0.07)
Basophils Absolute: 0 10*3/uL (ref 0.0–0.1)
Basophils Relative: 1 %
Eosinophils Absolute: 0.1 10*3/uL (ref 0.0–0.5)
Eosinophils Relative: 1 %
HCT: 31.9 % — ABNORMAL LOW (ref 36.0–46.0)
Hemoglobin: 9.6 g/dL — ABNORMAL LOW (ref 12.0–15.0)
Immature Granulocytes: 0 %
Lymphocytes Relative: 22 %
Lymphs Abs: 1.8 10*3/uL (ref 0.7–4.0)
MCH: 25.8 pg — ABNORMAL LOW (ref 26.0–34.0)
MCHC: 30.1 g/dL (ref 30.0–36.0)
MCV: 85.8 fL (ref 80.0–100.0)
Monocytes Absolute: 0.6 10*3/uL (ref 0.1–1.0)
Monocytes Relative: 7 %
Neutro Abs: 5.9 10*3/uL (ref 1.7–7.7)
Neutrophils Relative %: 69 %
Platelets: 319 10*3/uL (ref 150–400)
RBC: 3.72 MIL/uL — ABNORMAL LOW (ref 3.87–5.11)
RDW: 16.3 % — ABNORMAL HIGH (ref 11.5–15.5)
WBC: 8.5 10*3/uL (ref 4.0–10.5)
nRBC: 0 % (ref 0.0–0.2)

## 2019-04-03 LAB — COMPREHENSIVE METABOLIC PANEL
ALT: 12 U/L (ref 0–44)
AST: 17 U/L (ref 15–41)
Albumin: 4 g/dL (ref 3.5–5.0)
Alkaline Phosphatase: 68 U/L (ref 38–126)
Anion gap: 12 (ref 5–15)
BUN: 18 mg/dL (ref 8–23)
CO2: 25 mmol/L (ref 22–32)
Calcium: 9.7 mg/dL (ref 8.9–10.3)
Chloride: 103 mmol/L (ref 98–111)
Creatinine, Ser: 1.21 mg/dL — ABNORMAL HIGH (ref 0.44–1.00)
GFR calc Af Amer: 52 mL/min — ABNORMAL LOW (ref >60)
GFR calc non Af Amer: 45 mL/min — ABNORMAL LOW (ref >60)
Glucose, Bld: 131 mg/dL — ABNORMAL HIGH (ref 70–99)
Potassium: 3.9 mmol/L (ref 3.5–5.1)
Sodium: 140 mmol/L (ref 135–145)
Total Bilirubin: 0.4 mg/dL (ref 0.3–1.2)
Total Protein: 7.2 g/dL (ref 6.5–8.1)

## 2019-04-03 LAB — LIPASE, BLOOD: Lipase: 26 U/L (ref 11–51)

## 2019-04-03 LAB — LACTIC ACID, PLASMA
Lactic Acid, Venous: 1.5 mmol/L (ref 0.5–1.9)
Lactic Acid, Venous: 1.3 mmol/L (ref 0.5–1.9)

## 2019-04-03 LAB — SARS CORONAVIRUS 2 (TAT 6-24 HRS): SARS Coronavirus 2: NEGATIVE

## 2019-04-03 MED ORDER — SUCRALFATE 1 GM/10ML PO SUSP
1.0000 g | Freq: Three times a day (TID) | ORAL | Status: DC
  Administered 2019-04-03 – 2019-04-05 (×6): 1 g via ORAL
  Filled 2019-04-03 (×7): qty 10

## 2019-04-03 MED ORDER — IOHEXOL 300 MG/ML  SOLN: 75.0000 mL | Freq: Once | INTRAMUSCULAR | Status: DC | PRN

## 2019-04-03 MED ORDER — SALINE SPRAY 0.65 % NA SOLN
1.0000 | NASAL | Status: DC | PRN
  Administered 2019-04-03: 1 via NASAL
  Filled 2019-04-03: qty 44

## 2019-04-03 MED ORDER — PANTOPRAZOLE SODIUM 40 MG IV SOLR
40.0000 mg | Freq: Two times a day (BID) | INTRAVENOUS | Status: DC
  Filled 2019-04-03 (×2): qty 40

## 2019-04-03 MED ORDER — POLYETHYLENE GLYCOL 3350 17 G PO PACK
17.0000 g | PACK | Freq: Once | ORAL | Status: AC
  Administered 2019-04-03: 17 g via ORAL
  Filled 2019-04-03: qty 1

## 2019-04-03 MED ORDER — ONDANSETRON HCL 4 MG/2ML IJ SOLN
4.0000 mg | Freq: Four times a day (QID) | INTRAMUSCULAR | Status: DC | PRN
  Administered 2019-04-03 – 2019-04-05 (×5): 4 mg via INTRAVENOUS
  Filled 2019-04-03 (×5): qty 2

## 2019-04-03 MED ORDER — IOHEXOL 350 MG/ML SOLN: 100.0000 mL | Freq: Once | INTRAVENOUS | Status: DC | PRN

## 2019-04-03 MED ORDER — IOHEXOL 300 MG/ML  SOLN
80.0000 mL | Freq: Once | INTRAMUSCULAR | Status: AC | PRN
  Administered 2019-04-03: 80 mL via INTRAVENOUS

## 2019-04-03 MED ORDER — FENTANYL CITRATE (PF) 100 MCG/2ML IJ SOLN
50.0000 ug | Freq: Once | INTRAMUSCULAR | Status: AC
  Administered 2019-04-03: 50 ug via INTRAVENOUS
  Filled 2019-04-03: qty 2

## 2019-04-03 MED ORDER — ONDANSETRON HCL 4 MG PO TABS: 4.0000 mg | ORAL_TABLET | Freq: Four times a day (QID) | ORAL | Status: DC | PRN

## 2019-04-03 MED ORDER — ACETAMINOPHEN 325 MG PO TABS: 650.0000 mg | ORAL_TABLET | Freq: Four times a day (QID) | ORAL | Status: DC | PRN

## 2019-04-03 MED ORDER — NON FORMULARY: 6.0000 mg | Freq: Every evening | Status: DC | PRN

## 2019-04-03 MED ORDER — OXYCODONE-ACETAMINOPHEN 5-325 MG PO TABS
1.0000 | ORAL_TABLET | Freq: Once | ORAL | Status: AC
  Administered 2019-04-03: 1 via ORAL
  Filled 2019-04-03: qty 1

## 2019-04-03 MED ORDER — PANTOPRAZOLE SODIUM 40 MG IV SOLR
40.0000 mg | Freq: Once | INTRAVENOUS | Status: AC
  Administered 2019-04-03: 40 mg via INTRAVENOUS
  Filled 2019-04-03: qty 40

## 2019-04-03 MED ORDER — POLYETHYLENE GLYCOL 3350 17 G PO PACK
17.0000 g | PACK | Freq: Every day | ORAL | Status: DC | PRN
  Administered 2019-04-04: 17 g via ORAL
  Filled 2019-04-03 (×2): qty 1

## 2019-04-03 MED ORDER — MELATONIN 3 MG PO TABS
6.0000 mg | ORAL_TABLET | Freq: Every evening | ORAL | Status: DC | PRN
  Administered 2019-04-03 – 2019-04-04 (×2): 6 mg via ORAL
  Filled 2019-04-03 (×2): qty 2

## 2019-04-03 MED ORDER — LABETALOL HCL 5 MG/ML IV SOLN: 10.0000 mg | INTRAVENOUS | Status: DC | PRN

## 2019-04-03 MED ORDER — ONDANSETRON HCL 4 MG/2ML IJ SOLN
4.0000 mg | Freq: Once | INTRAMUSCULAR | Status: AC
  Administered 2019-04-03: 4 mg via INTRAVENOUS
  Filled 2019-04-03: qty 2

## 2019-04-03 MED ORDER — SODIUM CHLORIDE 0.9 % IV BOLUS
500.0000 mL | Freq: Once | INTRAVENOUS | Status: AC
  Administered 2019-04-03: 500 mL via INTRAVENOUS

## 2019-04-03 MED ORDER — ACETAMINOPHEN 650 MG RE SUPP: 650.0000 mg | Freq: Four times a day (QID) | RECTAL | Status: DC | PRN

## 2019-04-03 MED ORDER — MORPHINE SULFATE (PF) 2 MG/ML IV SOLN
2.0000 mg | INTRAVENOUS | Status: DC | PRN
  Administered 2019-04-03 – 2019-04-05 (×8): 2 mg via INTRAVENOUS
  Filled 2019-04-03 (×8): qty 1

## 2019-04-03 NOTE — H&P (View-Only) (Signed)
Primary Care Physician:  Lemmie Evens, MD Primary Gastroenterologist:  Dr. Gala Romney  Pre-Procedure History & Physical: HPI:  [name] is a 74 y.o. female here for further evaluation insidiously worsening abdominal pain over the past couple of months.  Seen here last month.  Noncontrast CT revealed increased stool burden but nothing acute.  This lady notes that since she was last seen, her pain is steadily increased -  postprandial in nature; diffusely right-sided;  hard for her to take a brief a deep breath.  She seems to be splinting today.  No bleeding per rectum.  Eating worsens the pain.  Gallbladder out.  History of abdominal wall hernia for which she has seen Dr. Arnoldo Morale previously. Side effects related to antihypertensives for which she stopped taking them without notifying her PCP.  Blood pressure significantly elevated today.  She has lumps in her right axillary area which burst and drained purulent material recently.  History of a gastric bypass surgery.  EGD a little over a year ago demonstrated healed anastomotic ulcers.  Patent efferent and afferent limbs.   Past Medical History:  Diagnosis Date  . Anemia   . Anxiety   . Arthritis   . Blood transfusion    /w bleeding ulcer & post knee replacement- 2012  . Chronic kidney disease    h/o renal calculi, h/o lithotripsy  . Copper deficiency 06/17/2015  . Depression   . GERD (gastroesophageal reflux disease)    h/o bleeding ulcer, hosp., [facility]  . Headache(784.0)   . Hiatal hernia   . History of kidney stones   . Hypothyroidism   . Morbid obesity ([place])   . Neuralgia    spondylosis & stenosis  . Obsessive-compulsive disorder   . PONV (postoperative nausea and vomiting)   . Recurrent upper respiratory infection (URI)   . Shortness of breath    with exertion  . Sleep apnea    study- [facility], CPAP not used in 3 months, states she has a new machine  but hasn't used   . Thyroid disease     Past Surgical  History:  Procedure Laterality Date  . ABDOMINAL HYSTERECTOMY    . ANTERIOR CERVICAL DECOMP/DISCECTOMY FUSION  12/09/2010   Procedure: ANTERIOR CERVICAL DECOMPRESSION/DISCECTOMY FUSION 3 LEVELS;  Surgeon: Ophelia Charter;  Location: [facility] ORS;  Service: Neurosurgery;  Laterality: N/A;  Cervical three-four,Cervical four-five,Cervical Five-Six,Cervical Six-Seven ANTERIOR CERVICAL DECOMPRESSION WITH FUSION INTERBODY PROTHESIS PLATING AND BONEGRAFT  . APPENDECTOMY    . BIOPSY N/A 11/14/2014   Procedure: GASTRIC BIOPSY;  Surgeon: Daneil Dolin, MD;  Location: [facility];  Service: Endoscopy;  Laterality: N/A;  . CESAREAN SECTION    . CHOLECYSTECTOMY    . COLONOSCOPY WITH PROPOFOL N/A 11/14/2014   RMR: redundant colon but otherwise normal  . ESOPHAGOGASTRODUODENOSCOPY (EGD) WITH PROPOFOL N/A 11/14/2014   RMR"s/p gastric surgery/anastomtic ulcer  . ESOPHAGOGASTRODUODENOSCOPY (EGD) WITH PROPOFOL N/A 09/15/2017   Procedure: ESOPHAGOGASTRODUODENOSCOPY (EGD) WITH PROPOFOL;  Surgeon: Daneil Dolin, MD;  Location: [facility];  Service: Endoscopy;  Laterality: N/A;  11:30am  . EYE SURGERY     bilateral cataracts removed, w/IOL  . GASTRIC BYPASS  2007  . JOINT REPLACEMENT     07/2010 &  2002- respectively- both knees   . MALONEY DILATION N/A 09/15/2017   Procedure: Venia Minks DILATION;  Surgeon: Daneil Dolin, MD;  [facility]: [facility];  Service: Endoscopy;  Laterality: N/A;  . PARATHYROIDECTOMY    . PERIPHERALLY INSERTED  CENTRAL CATHETER INSERTION     PICC line for treatment of MRSA  . TONSILLECTOMY     as a child    Prior to Admission medications   Medication Sig Start Date End Date Taking? Authorizing Provider  Cyanocobalamin (VITAMIN B-12 IJ) Inject 1 Applicatorful as directed every 30 (thirty) days.    Yes [provider]  furosemide (LASIX) 40 MG tablet Take 40 mg by mouth as needed.   Yes [provider]  levothyroxine (SYNTHROID, LEVOTHROID) 75 MCG tablet Take 75  mcg by mouth daily before breakfast.   Yes [provider]  ondansetron (ZOFRAN) 4 MG tablet Take 1 tablet (4 mg total) by mouth every 8 (eight) hours as needed for nausea or vomiting. Patient taking differently: Take 4 mg by mouth as needed for nausea or vomiting.  03/26/19  Yes Annitta Needs, NP  venlafaxine (EFFEXOR) 75 MG tablet Take 75 mg by mouth 2 (two) times daily.    Yes [provider]  Vitamin D, Ergocalciferol, (DRISDOL) 50000 units CAPS capsule Take 50,000 Units by mouth every 7 (seven) days. Fridays   Yes [provider]  oxyCODONE-acetaminophen (PERCOCET) 5-325 MG tablet Take 1-2 tablets by mouth every 8 (eight) hours as needed for severe pain. Patient not taking: Reported on 04/03/2019 03/01/19   Carlis Stable, NP    Allergies as of 04/03/2019 - Review Complete 04/03/2019  Allergen Reaction Noted  . Benzodiazepines Other (See Comments) 12/03/2011  . Codeine Nausea Only   . Escitalopram oxalate Nausea Only 12/02/2010  . Nsaids Other (See Comments) 12/22/2012  . Sertraline hcl Nausea Only   . Statins Nausea And Vomiting   . Tramadol hcl Other (See Comments) 12/22/2012    Family History  Problem Relation Age of Onset  . Anxiety disorder Mother   . Anxiety disorder Sister   . Bipolar disorder Daughter   . OCD Daughter   . ADD / ADHD Daughter   . Heart attack Father   . Anesthesia problems Neg Hx   . Hypotension Neg Hx   . Malignant hyperthermia Neg Hx   . Pseudochol deficiency Neg Hx   . Dementia Neg Hx   . Alcohol abuse Neg Hx   . Drug abuse Neg Hx   . Depression Neg Hx   . Paranoid behavior Neg Hx   . Schizophrenia Neg Hx   . Seizures Neg Hx   . Sexual abuse Neg Hx   . Physical abuse Neg Hx   . Colon cancer Neg Hx     Social History   Socioeconomic History  . Marital status: Widowed    Spouse name: Not on file  . Number of children: Not on file  . Years of education: Not on file  . Highest education level: Not on file   Occupational History  . Not on file  Tobacco Use  . Smoking status: Former Smoker    Packs/day: 1.00    Years: 30.00    Pack years: 30.00    Types: Cigarettes    Quit date: 01/18/1998    Years since quitting: 21.2  . Smokeless tobacco: Never Used  Substance and Sexual Activity  . Alcohol use: No  . Drug use: No  . Sexual activity: Not Currently    Birth control/protection: Surgical  Other Topics Concern  . Not on file  Social History Narrative  . Not on file   Social Determinants of Health   Financial Resource Strain:   . Difficulty of Paying Living  Expenses:   Food Insecurity:   . Worried About Charity fundraiser in the Last Year:   . Arboriculturist in the Last Year:   Transportation Needs:   . Film/video editor (Medical):   [name] Lack of Transportation (Non-Medical):   Physical Activity:   . Days of Exercise per Week:   . Minutes of Exercise per Session:   Stress:   . Feeling of Stress :   Social Connections:   . Frequency of Communication with Friends and Family:   . Frequency of Social Gatherings with Friends and Family:   . Attends Religious Services:   . Active Member of Clubs or Organizations:   . Attends Archivist Meetings:   [name] Marital Status:   Intimate Partner Violence:   . Fear of Current or Ex-Partner:   . Emotionally Abused:   [name] Physically Abused:   . Sexually Abused:     Review of Systems: See HPI, otherwise negative ROS  Physical Exam: BP (!) 188/100   Pulse 96   Temp (!) 97.1 F (36.2 C) (Temporal)   Ht 5\' 5"  (1.651 m)   Wt 219 lb 9.6 oz (99.6 kg)   BMI 36.54 kg/m  General:   Alert, seems to be in distress.  Conversant.  Splinting on the right side.  Difficult to take a deep breath.   Neck:  Supple; no masses or thyromegaly. No significant cervical adenopathy. Lungs:  Clear throughout to auscultation.   No wheezes, crackles, or rhonchi. No acute distress. Heart:  Regular rate and rhythm; no murmurs, clicks, rubs,  or  gallops. Abdomen: Obese.  Midline surgical scar bowel sounds present.  She has diffuse right-sided abdominal history deep palpation.  She has guarding.  Obvious mass organomegaly no obvious hernia. Pulses:  Normal pulses noted. Extremities:  Without clubbing or edema. Rectal:  Impression: 74 year old lady status post gastric obesity procedure in the distant past here with insidiously worsening right sided;   postprandial component.   She does have baseline constipation.  Blood pressure is extremely elevated.   Chronic constipation.  Noncontrast CT last month -limited information.  Concerned about a potential surgical process including an internal hernia, mesenteric ischemia would remain the differential at this time among other possible causes.  Constipation  could be a contributing factor but life-threatening entities need to be ruled out.  Hidradenitis suppurativa  - right axilla  Recommendations:  To the ED immediately for further evaluation.  Discussed with Magda Paganini, triage nurse.  Urgent evaluation needed.  Goal would be for a IV / oral CONTRAST CT -evaluate for ischemia, rule out "swirling fat" associated with an internal hernia, etc. blood pressure management.          Notice: This dictation was prepared with Dragon dictation along with smaller phrase technology. Any transcriptional errors that result from this process are unintentional and may not be corrected upon review.

## 2019-04-03 NOTE — Care Management Obs Status (Signed)
[facility] NOTIFICATION   Patient Details  Name: [name] MRN: [id number] Date of Birth: 06-Nov-1945   Medicare Observation Status Notification Given:  Yes    Sherie Don, LCSW 04/03/2019, 6:39 PM

## 2019-04-03 NOTE — H&P (Signed)
History and Physical    [name] [id number] DOB: 02-Sep-1945 DOA: 04/03/2019  PCP: Lemmie Evens, MD   Patient coming from: Home  I have personally briefly reviewed patient's old medical records in [place]  Chief Complaint: Abdominal Pain  HPI: ILLIANNA [name] is a 74 y.o. female with medical history significant for depression and anxiety, hypertension, hypothyroidism, CKD 3.  Patient presented to the ED with complaints of abdominal pain right-sided over the past 2 months increasing in severity.  She presented to see her gastroenterologist today, she appeared to be splinting, was referred to the ED. She denies melena, blood in stools or vomiting of blood.  Patient has been taking Goody powders and ibuprofen about 2 - 3 months ago when she was diagnosed with COVID but she didn't not require hospitalization for symptoms. She has since stopped taking NSAIDS.  She denies pain with urination or change in urinary frequency. She talks lasix as needed for leg swelling.  ED Course: Temperature 99.8.  Blood pressure 160s to 190s.  Hemoglobin 9.6, 1 year ago hemoglobin was 14.2.  UA shows rare bacteria trace leukocytes.  Abdominal CT with contrast negative for acute abnormality. EDP talked to general surgery Dr. Constance Haw and gastroenterology Dr. Gala Romney, recommended hospitalist admission, Protonix, plans for EGD in a.m.  Review of Systems: As per HPI all other systems reviewed and negative.  Past Medical History:  Diagnosis Date  . Anemia   . Anxiety   . Arthritis   . Blood transfusion    /w bleeding ulcer & post knee replacement- 2012  . Chronic kidney disease    h/o renal calculi, h/o lithotripsy  . Copper deficiency 06/17/2015  . Depression   . GERD (gastroesophageal reflux disease)    h/o bleeding ulcer, hosp., [facility]  . Headache(784.0)   . Hiatal hernia   . History of kidney stones   . Hypothyroidism   . Morbid obesity ([place])   . Neuralgia    spondylosis & stenosis    . Obsessive-compulsive disorder   . PONV (postoperative nausea and vomiting)   . Recurrent upper respiratory infection (URI)   . Shortness of breath    with exertion  . Sleep apnea    [facility]- [facility], CPAP not used in 3 months, states she has a new machine  but hasn't used   . Thyroid disease     Past Surgical History:  Procedure Laterality Date  . ABDOMINAL HYSTERECTOMY    . ANTERIOR CERVICAL DECOMP/DISCECTOMY FUSION  12/09/2010   Procedure: ANTERIOR CERVICAL DECOMPRESSION/DISCECTOMY FUSION 3 LEVELS;  Surgeon: Ophelia Charter;  Location: [facility] ORS;  Service: Neurosurgery;  Laterality: N/A;  Cervical three-four,Cervical four-five,Cervical Five-Six,Cervical Six-Seven ANTERIOR CERVICAL DECOMPRESSION WITH FUSION INTERBODY PROTHESIS PLATING AND BONEGRAFT  . APPENDECTOMY    . BIOPSY N/A 11/14/2014   Procedure: GASTRIC BIOPSY;  Surgeon: Daneil Dolin, MD;  Location: [facility];  Service: Endoscopy;  Laterality: N/A;  . CESAREAN SECTION    . CHOLECYSTECTOMY    . COLONOSCOPY WITH PROPOFOL N/A 11/14/2014   RMR: redundant colon but otherwise normal  . ESOPHAGOGASTRODUODENOSCOPY (EGD) WITH PROPOFOL N/A 11/14/2014   RMR"s/p gastric surgery/anastomtic ulcer  . ESOPHAGOGASTRODUODENOSCOPY (EGD) WITH PROPOFOL N/A 09/15/2017   Procedure: ESOPHAGOGASTRODUODENOSCOPY (EGD) WITH PROPOFOL;  Surgeon: Daneil Dolin, MD;  [facility];  Service: Endoscopy;  Laterality: N/A;  11:30am  . EYE SURGERY     bilateral cataracts removed, w/IOL  . GASTRIC BYPASS  2007  . JOINT  REPLACEMENT     07/2010 &  2002- respectively- both knees   . MALONEY DILATION N/A 09/15/2017   Procedure: Venia Minks DILATION;  Surgeon: Daneil Dolin, MD;  [facility];  Service: Endoscopy;  Laterality: N/A;  . PARATHYROIDECTOMY    . PERIPHERALLY INSERTED CENTRAL CATHETER INSERTION     PICC line for treatment of MRSA  . TONSILLECTOMY     as a child     reports that she quit smoking about 21 years ago.  Her smoking use included cigarettes. She has a 30.00 pack-year smoking history. She has never used smokeless tobacco. She reports that she does not drink alcohol or use drugs.  Allergies  Allergen Reactions  . Benzodiazepines Other (See Comments)    Cloudy thinking, memory loss, withdrawal symptoms when trying to stop without any other medication for detox.   . Codeine Nausea Only  . Escitalopram Oxalate Nausea Only  . Nsaids Other (See Comments)    Bleeding ulcer   . Sertraline Hcl Nausea Only  . Statins Nausea And Vomiting  . Tramadol Hcl Other (See Comments)    Daughter doesn't want mother to take due to the fact that the medication made the daughter have a seizure     Family History  Problem Relation Age of Onset  . Anxiety disorder Mother   . Anxiety disorder Sister   . Bipolar disorder Daughter   . OCD Daughter   . ADD / ADHD Daughter   . Heart attack Father   . Anesthesia problems Neg Hx   . Hypotension Neg Hx   . Malignant hyperthermia Neg Hx   . Pseudochol deficiency Neg Hx   . Dementia Neg Hx   . Alcohol abuse Neg Hx   . Drug abuse Neg Hx   . Depression Neg Hx   . Paranoid behavior Neg Hx   . Schizophrenia Neg Hx   . Seizures Neg Hx   . Sexual abuse Neg Hx   . Physical abuse Neg Hx   . Colon cancer Neg Hx     Prior to Admission medications   Medication Sig Start Date End Date Taking? Authorizing Provider  Cyanocobalamin (VITAMIN B-12 IJ) Inject 1 Applicatorful as directed every 30 (thirty) days.     [provider]  diclofenac Sodium (VOLTAREN) 1 % GEL APPLY A SMALL AMOUNT TO SKIN THREE TIMES DAILY AS DIRECTED 02/19/19   [provider]  furosemide (LASIX) 40 MG tablet Take 40 mg by mouth as needed.    [provider]  levothyroxine (SYNTHROID, LEVOTHROID) 75 MCG tablet Take 75 mcg by mouth daily before breakfast.    [provider]  NEXIUM 40 MG capsule Take 40 mg by mouth 2 (two) times daily. 01/16/19   [provider]  ondansetron (ZOFRAN) 4 MG tablet Take 1 tablet (4 mg total) by mouth every 8 (eight) hours as needed for nausea or vomiting. Patient taking differently: Take 4 mg by mouth as needed for nausea or vomiting.  03/26/19   Annitta Needs, NP  oxyCODONE-acetaminophen (PERCOCET) 5-325 MG tablet Take 1-2 tablets by mouth every 8 (eight) hours as needed for severe pain. Patient not taking: Reported on 04/03/2019 03/01/19   Carlis Stable, NP  traMADol Veatrice Bourbon) 50 MG tablet SMARTSIG:1 Tablet(s) By Mouth 1 to 2 Times Daily 03/21/19   [provider]  venlafaxine (EFFEXOR) 75 MG tablet Take 75 mg by mouth 2 (two) times daily.     [provider]  Vitamin  D, Ergocalciferol, (DRISDOL) 50000 units CAPS capsule Take 50,000 Units by mouth every 7 (seven) days. Fridays    [provider]    Physical Exam: Vitals:   04/03/19 1100 04/03/19 1300 04/03/19 1500 04/03/19 1600  BP: (!) 194/86  (!) 167/93 (!) 172/84  Pulse: 91  80 78  Resp: 20 18 18 13   Temp:      TempSrc:      SpO2: 100%  100% 100%  Weight:      Height:        Constitutional: NAD, calm, comfortable Vitals:   04/03/19 1100 04/03/19 1300 04/03/19 1500 04/03/19 1600  BP: (!) 194/86  (!) 167/93 (!) 172/84  Pulse: 91  80 78  Resp: 20 18 18 13   Temp:      TempSrc:      SpO2: 100%  100% 100%  Weight:      Height:       Eyes: PERRL, lids and conjunctivae normal ENMT: Mucous membranes are moist. Posterior pharynx clear of any exudate or lesions.  Neck: normal, supple, no masses, no thyromegaly Respiratory: Normal respiratory effort. No accessory muscle use.  Cardiovascular: Regular rate and rhythm, leg appear puffy without pitting extremity edema. 2+ pedal pulses.  Abdomen: no tenderness, no masses palpated. No hepatosplenomegaly. Bowel sounds positive.  Musculoskeletal: no clubbing / cyanosis. No joint deformity upper and lower extremities. Good ROM, no contractures. Normal muscle tone.  Skin: no rashes, lesions,  ulcers. No induration Neurologic:  No apparent cranial nerve abnormality, moving all extremities spontaneously.[name] Psychiatric: Normal judgment and insight. Alert and oriented x 3. Normal mood.   Labs on Admission: I have personally reviewed following labs and imaging studies  CBC: Recent Labs  Lab 04/03/19 1053  WBC 8.5  NEUTROABS 5.9  HGB 9.6*  HCT 31.9*  MCV 85.8  PLT 99991111   Basic Metabolic Panel: Recent Labs  Lab 04/03/19 1053  NA 140  K 3.9  CL 103  CO2 25  GLUCOSE 131*  BUN 18  CREATININE 1.21*  CALCIUM 9.7   Liver Function Tests: Recent Labs  Lab 04/03/19 1053  AST 17  ALT 12  ALKPHOS 68  BILITOT 0.4  PROT 7.2  ALBUMIN 4.0   Recent Labs  Lab 04/03/19 1053  LIPASE 26   Urine analysis:    Component Value Date/Time   COLORURINE STRAW (A) 04/03/2019 1037   APPEARANCEUR CLEAR 04/03/2019 1037   LABSPEC 1.016 04/03/2019 1037   PHURINE 8.0 04/03/2019 1037   GLUCOSEU NEGATIVE 04/03/2019 1037   HGBUR NEGATIVE 04/03/2019 1037   BILIRUBINUR NEGATIVE 04/03/2019 1037   KETONESUR NEGATIVE 04/03/2019 1037   PROTEINUR NEGATIVE 04/03/2019 1037   UROBILINOGEN 0.2 05/09/2012 1741   NITRITE NEGATIVE 04/03/2019 1037   LEUKOCYTESUR TRACE (A) 04/03/2019 1037    Radiological Exams on Admission: CT Abdomen Pelvis W Contrast  Result Date: 04/03/2019 CLINICAL DATA:  74 year old female with history of abdominal pain for the past several months. Pain is worse during deep inspiration. EXAM: CT ABDOMEN AND PELVIS WITH CONTRAST TECHNIQUE: Multidetector CT imaging of the abdomen and pelvis was performed using the standard protocol following bolus administration of intravenous contrast. CONTRAST:  64mL OMNIPAQUE IOHEXOL 300 MG/ML  SOLN COMPARISON:  CT the abdomen and pelvis 03/01/2019. FINDINGS: Lower chest: Mild scarring in the lung bases bilaterally. Calcifications of the mitral annulus. Aortic atherosclerosis. Hepatobiliary: Subcentimeter low-attenuation lesion in segment 7 of  the liver, too small to characterize, but statistically likely to represent a tiny cyst. No other suspicious  hepatic lesions. Status post cholecystectomy. Minimal intrahepatic biliary ductal dilatation which appear stable compared to prior examinations. Common bile duct is slightly prominent measuring 8 mm in the porta hepatis, which is within normal limits for this post cholecystectomy patient. Pancreas: No definite pancreatic mass or peripancreatic fluid collections or inflammatory changes. Spleen: Unremarkable. Adrenals/Urinary Tract: Bilateral kidneys and adrenal glands are normal in appearance. No hydroureteronephrosis. Urinary bladder is normal in appearance. Stomach/Bowel: Status post gastrojejunostomy. No pathologic dilatation of small bowel or colon. The appendix is not confidently identified and may be surgically absent. Regardless, there are no inflammatory changes noted adjacent to the cecum to suggest the presence of an acute appendicitis at this time. Vascular/Lymphatic: Aortic atherosclerosis, without evidence of aneurysm or dissection in the abdominal or pelvic vasculature. No lymphadenopathy noted in the abdomen or pelvis. Reproductive: Status post hysterectomy.  Ovaries are trophic. Other: No significant volume of ascites.  No pneumoperitoneum. Musculoskeletal: Status post PLIF at L4-L5 with interbody graft at L4-L5 interspace there are no aggressive appearing lytic or blastic lesions noted in the visualized portions of the skeleton. IMPRESSION: 1. No acute findings are noted in the abdomen or pelvis to account for the patient's symptoms. 2. Aortic atherosclerosis. 3. Postoperative changes and other chronic incidental findings, as above. Electronically Signed   By: Vinnie Langton M.D.   On: 04/03/2019 12:10    EKG: Independently reviewed.  Sinus rhythm, QTC 448.  Nonspecific T wave abnormalities in lateral leads are old and unchanged.  Assessment/Plan Active Problems:   Abdominal pain     Abdominal pain with acute anemia-hemoglobin 9.6, down from 14.2 a year ago.  Stable vitals.  Hx of NSAID use- Ibuprofen and Goody powders.  No hx of melena or rectal bleeding at this time. Last EGD for abdominal pain 2019 was unremarkable.  CT abdomen pelvis today unremarkable. -Anemia panel -CBC a.m. -Continue IV Protonix 40 mg every 12 hourly -Clear liquid diet, n.p.o. midnight - EDP talked to general surgery and Dr. Gala Romney, plans for EGD tomorrow -IV morphine 2 mg every 4 as needed for pain  Depression anxiety-stable -Resume home venlafaxine  Hypothyroidism-  -Resume home Synthroid  CKD 3-stable, creatinine 1.2, baseline 0.8-1.3. -Contrast exposure today, will hydrate gently N/s 75cc/hr x 12hrs  HTN-blood pressure elevated systolic [id number] to [id number]. -On Lasix, held for now with contrast exposure, otherwise not on antihypertensives. -As needed IV labetalol 10mg  [id number] PRN for systolic greater than [id number]   DVT prophylaxis: SCDs Code Status: Full code Family Communication: None at bedside Disposition Plan: 1- 2 days Consults called: Gen Surg, Gastroenterology Admission status: Obs, med surg   Bethena Roys MD [facility]  04/03/2019, 6:04 PM

## 2019-04-03 NOTE — ED Triage Notes (Signed)
C/o right upper abdominal pain.  Gastric bypass in 2008 and pain has worsen.  Pt of Dr Gala Romney.  Rates pain 10/10 sharp and constant.

## 2019-04-03 NOTE — Progress Notes (Signed)
Primary Care Physician:  Lemmie Evens, MD Primary Gastroenterologist:  Dr. Gala Romney  Pre-Procedure History & Physical: HPI:  [name] is a 74 y.o. female here for further evaluation insidiously worsening abdominal pain over the past couple of months.  Seen here last month.  Noncontrast CT revealed increased stool burden but nothing acute.  This lady notes that since she was last seen, her pain is steadily increased -  postprandial in nature; diffusely right-sided;  hard for her to take a brief a deep breath.  She seems to be splinting today.  No bleeding per rectum.  Eating worsens the pain.  Gallbladder out.  History of abdominal wall hernia for which she has seen Dr. Arnoldo Morale previously. Side effects related to antihypertensives for which she stopped taking them without notifying her PCP.  Blood pressure significantly elevated today.  She has lumps in her right axillary area which burst and drained purulent material recently.  History of a gastric bypass surgery.  EGD a little over a year ago demonstrated healed anastomotic ulcers.  Patent efferent and afferent limbs.   Past Medical History:  Diagnosis Date  . Anemia   . Anxiety   . Arthritis   . Blood transfusion    /w bleeding ulcer & post knee replacement- 2012  . Chronic kidney disease    h/o renal calculi, h/o lithotripsy  . Copper deficiency 06/17/2015  . Depression   . GERD (gastroesophageal reflux disease)    h/o bleeding ulcer, hosp., [facility]  . Headache(784.0)   . Hiatal hernia   . History of kidney stones   . Hypothyroidism   . Morbid obesity ([place])   . Neuralgia    spondylosis & stenosis  . Obsessive-compulsive disorder   . PONV (postoperative nausea and vomiting)   . Recurrent upper respiratory infection (URI)   . Shortness of breath    with exertion  . Sleep apnea    [facility], CPAP not used in 3 months, states she has a new machine  but hasn't used   . Thyroid disease     Past Surgical  History:  Procedure Laterality Date  . ABDOMINAL HYSTERECTOMY    . ANTERIOR CERVICAL DECOMP/DISCECTOMY FUSION  12/09/2010   Procedure: ANTERIOR CERVICAL DECOMPRESSION/DISCECTOMY FUSION 3 LEVELS;  Surgeon: Ophelia Charter;  Location: [place] [facility];  Service: Neurosurgery;  Laterality: N/A;  Cervical three-four,Cervical four-five,Cervical Five-Six,Cervical Six-Seven ANTERIOR CERVICAL DECOMPRESSION WITH FUSION INTERBODY PROTHESIS PLATING AND BONEGRAFT  . APPENDECTOMY    . BIOPSY N/A 11/14/2014   Procedure: GASTRIC BIOPSY;  Surgeon: Daneil Dolin, MD;  Location: [facility];  Service: Endoscopy;  Laterality: N/A;  . CESAREAN SECTION    . CHOLECYSTECTOMY    . COLONOSCOPY WITH PROPOFOL N/A 11/14/2014   RMR: redundant colon but otherwise normal  . ESOPHAGOGASTRODUODENOSCOPY (EGD) WITH PROPOFOL N/A 11/14/2014   RMR"s/p gastric surgery/anastomtic ulcer  . ESOPHAGOGASTRODUODENOSCOPY (EGD) WITH PROPOFOL N/A 09/15/2017   Procedure: ESOPHAGOGASTRODUODENOSCOPY (EGD) WITH PROPOFOL;  Surgeon: Daneil Dolin, MD;  Location: [facility];  Service: Endoscopy;  Laterality: N/A;  11:30am  . EYE SURGERY     bilateral cataracts removed, w/IOL  . GASTRIC BYPASS  2007  . JOINT REPLACEMENT     07/2010 &  2002- respectively- both knees   . MALONEY DILATION N/A 09/15/2017   Procedure: Venia Minks DILATION;  Surgeon: Daneil Dolin, MD;  Location: [facility];  Service: Endoscopy;  Laterality: N/A;  . PARATHYROIDECTOMY    . PERIPHERALLY INSERTED  CENTRAL CATHETER INSERTION     PICC line for treatment of MRSA  . TONSILLECTOMY     as a child    Prior to Admission medications   Medication Sig Start Date End Date Taking? Authorizing Provider  Cyanocobalamin (VITAMIN B-12 IJ) Inject 1 Applicatorful as directed every 30 (thirty) days.    Yes [provider]  furosemide (LASIX) 40 MG tablet Take 40 mg by mouth as needed.   Yes [provider]  levothyroxine (SYNTHROID, LEVOTHROID) 75 MCG tablet Take 75  mcg by mouth daily before breakfast.   Yes [provider]  ondansetron (ZOFRAN) 4 MG tablet Take 1 tablet (4 mg total) by mouth every 8 (eight) hours as needed for nausea or vomiting. Patient taking differently: Take 4 mg by mouth as needed for nausea or vomiting.  03/26/19  Yes Annitta Needs, NP  venlafaxine (EFFEXOR) 75 MG tablet Take 75 mg by mouth 2 (two) times daily.    Yes [provider]  Vitamin D, Ergocalciferol, (DRISDOL) 50000 units CAPS capsule Take 50,000 Units by mouth every 7 (seven) days. Fridays   Yes [provider]  oxyCODONE-acetaminophen (PERCOCET) 5-325 MG tablet Take 1-2 tablets by mouth every 8 (eight) hours as needed for severe pain. Patient not taking: Reported on 04/03/2019 03/01/19   Carlis Stable, NP    Allergies as of 04/03/2019 - Review Complete 04/03/2019  Allergen Reaction Noted  . Benzodiazepines Other (See Comments) 12/03/2011  . Codeine Nausea Only   . Escitalopram oxalate Nausea Only 12/02/2010  . Nsaids Other (See Comments) 12/22/2012  . Sertraline hcl Nausea Only   . Statins Nausea And Vomiting   . Tramadol hcl Other (See Comments) 12/22/2012    Family History  Problem Relation Age of Onset  . Anxiety disorder Mother   . Anxiety disorder Sister   . Bipolar disorder Daughter   . OCD Daughter   . ADD / ADHD Daughter   . Heart attack Father   . Anesthesia problems Neg Hx   . Hypotension Neg Hx   . Malignant hyperthermia Neg Hx   . Pseudochol deficiency Neg Hx   . Dementia Neg Hx   . Alcohol abuse Neg Hx   . Drug abuse Neg Hx   . Depression Neg Hx   . Paranoid behavior Neg Hx   . Schizophrenia Neg Hx   . Seizures Neg Hx   . Sexual abuse Neg Hx   . Physical abuse Neg Hx   . Colon cancer Neg Hx     Social History   Socioeconomic History  . Marital status: Widowed    Spouse name: Not on file  . Number of children: Not on file  . Years of education: Not on file  . Highest education level: Not on file   Occupational History  . Not on file  Tobacco Use  . Smoking status: Former Smoker    Packs/day: 1.00    Years: 30.00    Pack years: 30.00    Types: Cigarettes    Quit date: 01/18/1998    Years since quitting: 21.2  . Smokeless tobacco: Never Used  Substance and Sexual Activity  . Alcohol use: No  . Drug use: No  . Sexual activity: Not Currently    Birth control/protection: Surgical  Other Topics Concern  . Not on file  Social History Narrative  . Not on file   Social Determinants of Health   Financial Resource Strain:   . Difficulty of Paying Living  Expenses:   Food Insecurity:   . Worried About Charity fundraiser in the Last Year:   . Arboriculturist in the Last Year:   Transportation Needs:   . Film/video editor (Medical):   [name] Lack of Transportation (Non-Medical):   Physical Activity:   . Days of Exercise per Week:   . Minutes of Exercise per Session:   Stress:   . Feeling of Stress :   Social Connections:   . Frequency of Communication with Friends and Family:   . Frequency of Social Gatherings with Friends and Family:   . Attends Religious Services:   . Active Member of Clubs or Organizations:   . Attends Archivist Meetings:   [name] Marital Status:   Intimate Partner Violence:   . Fear of Current or Ex-Partner:   . Emotionally Abused:   [name] Physically Abused:   . Sexually Abused:     Review of Systems: See HPI, otherwise negative ROS  Physical Exam: BP (!) 188/100   Pulse 96   Temp (!) 97.1 F (36.2 C) (Temporal)   Ht 5\' 5"  (1.651 m)   Wt 219 lb 9.6 oz (99.6 kg)   BMI 36.54 kg/m  General:   Alert, seems to be in distress.  Conversant.  Splinting on the right side.  Difficult to take a deep breath.   Neck:  Supple; no masses or thyromegaly. No significant cervical adenopathy. Lungs:  Clear throughout to auscultation.   No wheezes, crackles, or rhonchi. No acute distress. Heart:  Regular rate and rhythm; no murmurs, clicks, rubs,  or  gallops. Abdomen: Obese.  Midline surgical scar bowel sounds present.  She has diffuse right-sided abdominal history deep palpation.  She has guarding.  Obvious mass organomegaly no obvious hernia. Pulses:  Normal pulses noted. Extremities:  Without clubbing or edema. Rectal:  Impression: 74 year old lady status post gastric obesity procedure in the distant past here with insidiously worsening right sided;   postprandial component.   She does have baseline constipation.  Blood pressure is extremely elevated.   Chronic constipation.  Noncontrast CT last month -limited information.  Concerned about a potential surgical process including an internal hernia, mesenteric ischemia would remain the differential at this time among other possible causes.  Constipation  could be a contributing factor but life-threatening entities need to be ruled out.  Hidradenitis suppurativa  - right axilla  Recommendations:  To the ED immediately for further evaluation.  Discussed with Magda Paganini, triage nurse.  Urgent evaluation needed.  Goal would be for a IV / oral CONTRAST CT -evaluate for ischemia, rule out "swirling fat" associated with an internal hernia, etc. blood pressure management.          Notice: This dictation was prepared with Dragon dictation along with smaller phrase technology. Any transcriptional errors that result from this process are unintentional and may not be corrected upon review.

## 2019-04-03 NOTE — ED Triage Notes (Signed)
Dr Gala Romney called pta and reports pt has has abd pain for "months" but worse over the past month.  Reports pain in r side.  Reports pt splinting area and has worsening pain with a deep breath.  Reports history of gastric bypass.  Says pt was seen here approx 1 month ago and had ct but was unable to have IV contrast at that time because she was a difficult IV stick.  Reports bp was 210/109 and pt stopped taking her bp medication on her own.  Dr. Gala Romney concerned for possible problem requiring surgical intervention.  Also reports hidradenitis suprativa.

## 2019-04-03 NOTE — Consult Note (Signed)
[place]  Reason for Consult: Abdominal pain, h/o gastric bypass Referring Physician: Kennith Maes, PA (ED)   Chief Complaint    Abdominal Pain      HPI: [name] is a 74 y.o. female with a history of gastric bypass in 2008 at [facility]. She reports having chronic abdominal pain since that time. She has had multiple CTs, ED visits, and GI visits regarding chronic epigastric pain. She has had a history of anastomotic ulcer in 2018. She reports in the last 6 weeks worsening pain and decreased appetite.  She describes the pain as stabbing in nature and in the epigastric region but also with pain diffusely. She says she had COVID and took at least 2 Goodies' powder a day and some ibuprofen. She is not taking this much any more, but she also has been off of Nexium due to insurance not covering it. She was seen by Dr. Gala Romney today for the pain and given the extent he sent her to the ED to rule out internal hernia.   She reports prior to surgery she weighed 300+ and lost down to 140 lbs at one point. The last notes at [facility] were from 2011. She says that she has always been a little constipated and had her last BM on Sunday, which is not unusual for her. She denies any bloody stools or dark stools.  She has had CT in the past that demonstrated a fat containing spigelian hernia on the right, but today's CT I do not see this so it must be reduced. She has even seen Dr. Arnoldo Morale for this and he felt nothing on exam, and did not have a good source for her chronic pain.   Her last EDG was in 08/2017 and it was clear of ulcer at that time. The time prior in 2016 she had an anastomotic ulcer.   Past Medical History:  Diagnosis Date  . Anemia   . Anxiety   . Arthritis   . Blood transfusion    /w bleeding ulcer & post knee replacement- 2012  . Chronic kidney disease    h/o renal calculi, h/o lithotripsy  . Copper deficiency 06/17/2015  . Depression   . GERD  (gastroesophageal reflux disease)    h/o bleeding ulcer, hosp., [facility]  . Headache(784.0)   . Hiatal hernia   . History of kidney stones   . Hypothyroidism   . Morbid obesity ([place])   . Neuralgia    spondylosis & stenosis  . Obsessive-compulsive disorder   . PONV (postoperative nausea and vomiting)   . Recurrent upper respiratory infection (URI)   . Shortness of breath    with exertion  . Sleep apnea    study- [facility], CPAP not used in 3 months, states she has a new machine  but hasn't used   . Thyroid disease     Past Surgical History:  Procedure Laterality Date  . ABDOMINAL HYSTERECTOMY    . ANTERIOR CERVICAL DECOMP/DISCECTOMY FUSION  12/09/2010   Procedure: ANTERIOR CERVICAL DECOMPRESSION/DISCECTOMY FUSION 3 LEVELS;  Surgeon: Ophelia Charter;  Location: [facility];  Service: Neurosurgery;  Laterality: N/A;  Cervical three-four,Cervical four-five,Cervical Five-Six,Cervical Six-Seven ANTERIOR CERVICAL DECOMPRESSION WITH FUSION INTERBODY PROTHESIS PLATING AND BONEGRAFT  . APPENDECTOMY    . BIOPSY N/A 11/14/2014   Procedure: GASTRIC BIOPSY;  Surgeon: Daneil Dolin, MD;  Location: [facility];  Service: Endoscopy;  Laterality: N/A;  . CESAREAN SECTION    . CHOLECYSTECTOMY    .  COLONOSCOPY WITH PROPOFOL N/A 11/14/2014   RMR: redundant colon but otherwise normal  . ESOPHAGOGASTRODUODENOSCOPY (EGD) WITH PROPOFOL N/A 11/14/2014   RMR"s/p gastric surgery/anastomtic ulcer  . ESOPHAGOGASTRODUODENOSCOPY (EGD) WITH PROPOFOL N/A 09/15/2017   Procedure: ESOPHAGOGASTRODUODENOSCOPY (EGD) WITH PROPOFOL;  Surgeon: Daneil Dolin, MD;  Location: [facility];  Service: Endoscopy;  Laterality: N/A;  11:30am  . EYE SURGERY     bilateral cataracts removed, w/IOL  . GASTRIC BYPASS  2007  . JOINT REPLACEMENT     07/2010 &  2002- respectively- both knees   . MALONEY DILATION N/A 09/15/2017   Procedure: Venia Minks DILATION;  Surgeon: Daneil Dolin, MD;  Location: [facility];   Service: Endoscopy;  Laterality: N/A;  . PARATHYROIDECTOMY    . PERIPHERALLY INSERTED CENTRAL CATHETER INSERTION     PICC line for treatment of MRSA  . TONSILLECTOMY     as a child    Family History  Problem Relation Age of Onset  . Anxiety disorder Mother   . Anxiety disorder Sister   . Bipolar disorder Daughter   . OCD Daughter   . ADD / ADHD Daughter   . Heart attack Father   . Anesthesia problems Neg Hx   . Hypotension Neg Hx   . Malignant hyperthermia Neg Hx   . Pseudochol deficiency Neg Hx   . Dementia Neg Hx   . Alcohol abuse Neg Hx   . Drug abuse Neg Hx   . Depression Neg Hx   . Paranoid behavior Neg Hx   . Schizophrenia Neg Hx   . Seizures Neg Hx   . Sexual abuse Neg Hx   . Physical abuse Neg Hx   . Colon cancer Neg Hx     Social History   Tobacco Use  . Smoking status: Former Smoker    Packs/day: 1.00    Years: 30.00    Pack years: 30.00    Types: Cigarettes    Quit date: 01/18/1998    Years since quitting: 21.2  . Smokeless tobacco: Never Used  Substance Use Topics  . Alcohol use: No  . Drug use: No    Medications: I have reviewed the patient's current medications. Current Facility-Administered Medications  Medication Dose Route Frequency Provider Last Rate Last Admin  . iohexol (OMNIPAQUE) 350 MG/ML injection 100 mL  100 mL Intravenous Once PRN Petrucelli, Samantha R, PA-C      . [START ON 04/04/2019] pantoprazole (PROTONIX) injection 40 mg  40 mg Intravenous BID Petrucelli, Samantha R, PA-C      . sucralfate (CARAFATE) 1 GM/10ML suspension 1 g  1 g Oral TID WC & HS Petrucelli, Samantha R, PA-C   1 g at 04/03/19 1703   Current Outpatient Medications  Medication Sig Dispense Refill Last Dose  . Cyanocobalamin (VITAMIN B-12 IJ) Inject 1 Applicatorful as directed every 30 (thirty) days.      . diclofenac Sodium (VOLTAREN) 1 % GEL APPLY A SMALL AMOUNT TO SKIN THREE TIMES DAILY AS DIRECTED     . furosemide (LASIX) 40 MG tablet Take 40 mg by mouth as  needed.     [name] levothyroxine (SYNTHROID, LEVOTHROID) 75 MCG tablet Take 75 mcg by mouth daily before breakfast.     . NEXIUM 40 MG capsule Take 40 mg by mouth 2 (two) times daily.     . ondansetron (ZOFRAN) 4 MG tablet Take 1 tablet (4 mg total) by mouth every 8 (eight) hours as needed for nausea or vomiting. (Patient taking differently: Take  4 mg by mouth as needed for nausea or vomiting. ) 30 tablet 1   . oxyCODONE-acetaminophen (PERCOCET) 5-325 MG tablet Take 1-2 tablets by mouth every 8 (eight) hours as needed for severe pain. (Patient not taking: Reported on 04/03/2019) 15 tablet 0   . traMADol (ULTRAM) 50 MG tablet SMARTSIG:1 Tablet(s) By Mouth 1 to 2 Times Daily     . venlafaxine (EFFEXOR) 75 MG tablet Take 75 mg by mouth 2 (two) times daily.      . Vitamin D, Ergocalciferol, (DRISDOL) 50000 units CAPS capsule Take 50,000 Units by mouth every 7 (seven) days. Fridays       Allergies  Allergen Reactions  . Benzodiazepines Other (See Comments)    Cloudy thinking, memory loss, withdrawal symptoms when trying to stop without any other medication for detox.   . Codeine Nausea Only  . Escitalopram Oxalate Nausea Only  . Nsaids Other (See Comments)    Bleeding ulcer   . Sertraline Hcl Nausea Only  . Statins Nausea And Vomiting  . Tramadol Hcl Other (See Comments)    Daughter doesn't want mother to take due to the fact that the medication made the daughter have a seizure      ROS:  A comprehensive review of systems was negative except for: Gastrointestinal: positive for abdominal pain, nausea and poor appetite  Blood pressure (!) 173/87, pulse 81, temperature 99.8 F (37.7 C), temperature source Oral, resp. rate 15, height 5\' 6"  (1.676 m), weight 99.8 kg, SpO2 100 %. Physical Exam Vitals reviewed.  Constitutional:      Appearance: She is well-developed.  HENT:     Head: Normocephalic.  Eyes:     Extraocular Movements: Extraocular movements intact.  Cardiovascular:     Rate and  Rhythm: Normal rate.  Pulmonary:     Effort: Pulmonary effort is normal.  Abdominal:     General: There is no distension.     Palpations: Abdomen is soft.     Tenderness: There is abdominal tenderness in the right upper quadrant, epigastric area, periumbilical area and left upper quadrant.     Comments: Healed midline scar, no obvious hernia   Musculoskeletal:     Comments: Moves all extremities  Skin:    General: Skin is warm.  Neurological:     General: No focal deficit present.     Mental Status: She is alert and oriented to person, place, and time.  Psychiatric:        Mood and Affect: Mood normal.        Behavior: Behavior normal.     Results: Results for orders placed or performed during the hospital encounter of 04/03/19 (from the past 48 hour(s))  Urinalysis, Routine w reflex microscopic     Status: Abnormal   Collection Time: 04/03/19 10:37 AM  Result Value Ref Range   Color, Urine STRAW (A) YELLOW   APPearance CLEAR CLEAR   Specific Gravity, Urine 1.016 1.005 - 1.030   pH 8.0 5.0 - 8.0   Glucose, UA NEGATIVE NEGATIVE mg/dL   Hgb urine dipstick NEGATIVE NEGATIVE   Bilirubin Urine NEGATIVE NEGATIVE   Ketones, ur NEGATIVE NEGATIVE mg/dL   Protein, ur NEGATIVE NEGATIVE mg/dL   Nitrite NEGATIVE NEGATIVE   Leukocytes,Ua TRACE (A) NEGATIVE   WBC, UA 0-5 0 - 5 WBC/hpf   Bacteria, UA RARE (A) NONE SEEN   Squamous Epithelial / LPF 0-5 0 - 5    Comment: Performed at [facility], [address], [place], [place] [address]  Comprehensive metabolic panel     Status: Abnormal   Collection Time: 04/03/19 10:53 AM  Result Value Ref Range   Sodium 140 135 - 145 mmol/L   Potassium 3.9 3.5 - 5.1 mmol/L   Chloride 103 98 - 111 mmol/L   CO2 25 22 - 32 mmol/L   Glucose, Bld 131 (H) 70 - 99 mg/dL    Comment: Glucose reference range applies only to samples taken after fasting for at least 8 hours.   BUN 18 8 - 23 mg/dL   Creatinine, Ser 1.21 (H) 0.44 - 1.00 mg/dL   Calcium 9.7  8.9 - 10.3 mg/dL   Total Protein 7.2 6.5 - 8.1 g/dL   Albumin 4.0 3.5 - 5.0 g/dL   AST 17 15 - 41 U/L   ALT 12 0 - 44 U/L   Alkaline Phosphatase 68 38 - 126 U/L   Total Bilirubin 0.4 0.3 - 1.2 mg/dL   GFR calc non Af Amer 45 (L) >60 mL/min   GFR calc Af Amer 52 (L) >60 mL/min   Anion gap 12 5 - 15    Comment: Performed at [facility], [address], [place], [place] [address]  CBC with Differential     Status: Abnormal   Collection Time: 04/03/19 10:53 AM  Result Value Ref Range   WBC 8.5 4.0 - 10.5 K/uL   RBC 3.72 (L) 3.87 - 5.11 MIL/uL   Hemoglobin 9.6 (L) 12.0 - 15.0 g/dL   HCT 31.9 (L) 36.0 - 46.0 %   MCV 85.8 80.0 - 100.0 fL   MCH 25.8 (L) 26.0 - 34.0 pg   MCHC 30.1 30.0 - 36.0 g/dL   RDW 16.3 (H) 11.5 - 15.5 %   Platelets 319 150 - 400 K/uL   nRBC 0.0 0.0 - 0.2 %   Neutrophils Relative % 69 %   Neutro Abs 5.9 1.7 - 7.7 K/uL   Lymphocytes Relative 22 %   Lymphs Abs 1.8 0.7 - 4.0 K/uL   Monocytes Relative 7 %   Monocytes Absolute 0.6 0.1 - 1.0 K/uL   Eosinophils Relative 1 %   Eosinophils Absolute 0.1 0.0 - 0.5 K/uL   Basophils Relative 1 %   Basophils Absolute 0.0 0.0 - 0.1 K/uL   Immature Granulocytes 0 %   Abs Immature Granulocytes 0.03 0.00 - 0.07 K/uL    Comment: Performed at [facility], [address] [place], [place] [address]  Lipase, blood     Status: None   Collection Time: 04/03/19 10:53 AM  Result Value Ref Range   Lipase 26 11 - 51 U/L    Comment: Performed at [facility], [address] [place], [place] [address]  Lactic acid, plasma     Status: None   Collection Time: 04/03/19 10:53 AM  Result Value Ref Range   Lactic Acid, Venous 1.5 0.5 - 1.9 mmol/L    Comment: Performed at [facility], [address] [place], [place] [address]  Lactic acid, plasma     Status: None   Collection Time: 04/03/19 12:45 PM  Result Value Ref Range   Lactic Acid, Venous 1.3 0.5 - 1.9 mmol/L    Comment: Performed at [facility], [address] [place],   [address]   Personally reviewed- no obvious internal hernia or swirling noted of the mesentery, no free fluid or free air  CT Abdomen Pelvis W Contrast  Result Date: 04/03/2019 CLINICAL DATA:  74 year old female with history of abdominal pain for the past  several months. Pain is worse during deep inspiration. EXAM: CT ABDOMEN AND PELVIS WITH CONTRAST TECHNIQUE: Multidetector CT imaging of the abdomen and pelvis was performed using the standard protocol following bolus administration of intravenous contrast. CONTRAST:  26mL OMNIPAQUE IOHEXOL 300 MG/ML  SOLN COMPARISON:  CT the abdomen and pelvis 03/01/2019. FINDINGS: Lower chest: Mild scarring in the lung bases bilaterally. Calcifications of the mitral annulus. Aortic atherosclerosis. Hepatobiliary: Subcentimeter low-attenuation lesion in segment 7 of the liver, too small to characterize, but statistically likely to represent a tiny cyst. No other suspicious hepatic lesions. Status post cholecystectomy. Minimal intrahepatic biliary ductal dilatation which appear stable compared to prior examinations. Common bile duct is slightly prominent measuring 8 mm in the porta hepatis, which is within normal limits for this post cholecystectomy patient. Pancreas: No definite pancreatic mass or peripancreatic fluid collections or inflammatory changes. Spleen: Unremarkable. Adrenals/Urinary Tract: Bilateral kidneys and adrenal glands are normal in appearance. No hydroureteronephrosis. Urinary bladder is normal in appearance. Stomach/Bowel: Status post gastrojejunostomy. No pathologic dilatation of small bowel or colon. The appendix is not confidently identified and may be surgically absent. Regardless, there are no inflammatory changes noted adjacent to the cecum to suggest the presence of an acute appendicitis at this time. Vascular/Lymphatic: Aortic atherosclerosis, without evidence of aneurysm or dissection in the abdominal or pelvic vasculature. No lymphadenopathy noted in  the abdomen or pelvis. Reproductive: Status post hysterectomy.  Ovaries are trophic. Other: No significant volume of ascites.  No pneumoperitoneum. Musculoskeletal: Status post PLIF at L4-L5 with interbody graft at L4-L5 interspace there are no aggressive appearing lytic or blastic lesions noted in the visualized portions of the skeleton. IMPRESSION: 1. No acute findings are noted in the abdomen or pelvis to account for the patient's symptoms. 2. Aortic atherosclerosis. 3. Postoperative changes and other chronic incidental findings, as above. Electronically Signed   By: Vinnie Langton M.D.   On: 04/03/2019 12:10     Assessment & Plan:  NYARI BERNAU is a 74 y.o. female with a history of gastric bypass and recent Goodies and Ibuprofen use with COVID infection and has been off her Nexium. I would question another anastomotic ulcer as the cause of her worsening abdominal pain, but she does have some degree of chronic pain mixed in with this acute increase.    -Spoke with Dr. Gala Romney, recommended a EGD to assess, he agrees -Protonix IV BID, carafate QID -No surgical intervention indicated, if continues to have chronic pain would potentially recommend patient seeing bariatric surgeon so they can review everything and ensure nothing being missed, not all internal hernias are seen on CT scan and they will occasionally take a look operatively to rule out with chronic symptoms   All questions were answered to the satisfaction of the patient.   Virl Cagey 04/03/2019, 5:05 PM

## 2019-04-03 NOTE — ED Provider Notes (Signed)
[facility] EMERGENCY DEPARTMENT Provider Note   CSN: [id number] Arrival date & time: 04/03/19  1009     History Chief Complaint  Patient presents with  . Abdominal Pain    [name] is a 74 y.o. female with a history of anemia, GERD, hypothyroidism, OCD, hypercholesterolemia, sleep apnea, and multiple prior abdominal surgeries including gastric bypass, hysterectomy, appendectomy, cholecystectomy who presents to the emergency department per her GI office's instruction for evaluation of worsening abdominal pain.  Patient states she has had issues with constant abdominal pain for the past several weeks to months that is worse than her chronic abdominal pain s/p bypass, seems to be progressively worsening, it is constant, worse after eating, no alleviating factors.  Reports associated nausea with some episodes of emesis intermittently, none over the past 24 to 48 hours, and intermittent issues with constipation.  Her last bowel movement was 3 days prior, firm and irritated her hemorrhoids, she has been passing gas since.  Had a CT of the belly in February, plan was for IV contrast but they were unable to obtain IV access, therefore was done without contrast, did show moderate stool burden, no other acute process.  Seen by gastroenterologist Dr. Gala Romney today, referred to the emergency department due to concern for acute abdomen.  Patient denies fever, cough, chest pain, melena, hematemesis, or dyspnea.  She does note that she has intermittent palpitations described as fluttering that come and go, have been occurring for several weeks, none at present.  HPI     Past Medical History:  Diagnosis Date  . Anemia   . Anxiety   . Arthritis   . Blood transfusion    /w bleeding ulcer & post knee replacement- 2012  . Chronic kidney disease    h/o renal calculi, h/o lithotripsy  . Copper deficiency 06/17/2015  . Depression   . GERD (gastroesophageal reflux disease)    h/o bleeding ulcer, hosp.,  Rosana Berger  . Headache(784.0)   . Hiatal hernia   . History of kidney stones   . Hypothyroidism   . Morbid obesity ([place])   . Neuralgia    spondylosis & stenosis  . Obsessive-compulsive disorder   . PONV (postoperative nausea and vomiting)   . Recurrent upper respiratory infection (URI)   . Shortness of breath    with exertion  . Sleep apnea    [facility], CPAP not used in 3 months, states she has a new machine  but hasn't used   . Thyroid disease     Patient Active Problem List   Diagnosis Date Noted  . Nausea with vomiting 03/01/2019  . Diarrhea 03/01/2019  . Abdominal pain, epigastric 06/08/2017  . LLQ pain 06/08/2017  . Odynophagia 06/08/2017  . Dysphagia 06/08/2017  . Copper deficiency 06/17/2015  . Gastric ulceration   . Hiatal hernia   . Anemia, iron deficiency   . Spondylolisthesis of lumbar region 01/01/2013  . Palpitations 05/08/2012  . Insomnia due to mental disorder 12/03/2011  . Benzodiazepine causing adverse effect in therapeutic use 12/03/2011  . right upper urethral calculus with obstructionh 04/08/2010  . HYPOTHYROIDISM 03/26/2010  . Anxiety state, unspecified 03/26/2010  . Bariatric surgery status 03/26/2010  . Hydronephrosis, right 01/27/2010  . HEMOCCULT POSITIVE STOOL 08/11/2009  . Abdominal pain 05/20/2009  . BENIGN NEOPLASM OF PARATHYROID GLAND 03/19/2009  . B12 deficiency 03/19/2009  . HYPERCHOLESTEROLEMIA 03/19/2009  . OBESITY, MORBID 03/19/2009  . DEPRESSION/ANXIETY 03/19/2009  . Essential hypertension 03/19/2009  . GASTROESOPHAGEAL REFLUX  DISEASE, CHRONIC 03/19/2009  . DEGENERATIVE JOINT DISEASE 03/19/2009    Past Surgical History:  Procedure Laterality Date  . ABDOMINAL HYSTERECTOMY    . ANTERIOR CERVICAL DECOMP/DISCECTOMY FUSION  12/09/2010   Procedure: ANTERIOR CERVICAL DECOMPRESSION/DISCECTOMY FUSION 3 LEVELS;  Surgeon: Ophelia Charter;  Location: [facility] OR[facility];  Service: Neurosurgery;  Laterality: N/A;  Cervical  three-four,Cervical four-five,Cervical Five-Six,Cervical Six-Seven ANTERIOR CERVICAL DECOMPRESSION WITH FUSION INTERBODY PROTHESIS PLATING AND BONEGRAFT  . APPENDECTOMY    . BIOPSY N/A 11/14/2014   Procedure: GASTRIC BIOPSY;  Surgeon: Daneil Dolin, MD;  Location: [facility];  Service: Endoscopy;  Laterality: N/A;  . CESAREAN SECTION    . CHOLECYSTECTOMY    . COLONOSCOPY WITH PROPOFOL N/A 11/14/2014   RMR: redundant colon but otherwise normal  . ESOPHAGOGASTRODUODENOSCOPY (EGD) WITH PROPOFOL N/A 11/14/2014   RMR"s/p gastric surgery/anastomtic ulcer  . ESOPHAGOGASTRODUODENOSCOPY (EGD) WITH PROPOFOL N/A 09/15/2017   Procedure: ESOPHAGOGASTRODUODENOSCOPY (EGD) WITH PROPOFOL;  Surgeon: Daneil Dolin, MD;  Location: [facility];  Service: Endoscopy;  Laterality: N/A;  11:30am  . EYE SURGERY     bilateral cataracts removed, w/IOL  . GASTRIC BYPASS  2007  . JOINT REPLACEMENT     07/2010 &  2002- respectively- both knees   . MALONEY DILATION N/A 09/15/2017   Procedure: Venia Minks DILATION;  Surgeon: Daneil Dolin, MD;  Location: [facility];  Service: Endoscopy;  Laterality: N/A;  . PARATHYROIDECTOMY    . PERIPHERALLY INSERTED CENTRAL CATHETER INSERTION     PICC line for treatment of MRSA  . TONSILLECTOMY     as a child     OB History   No obstetric history on file.     Family History  Problem Relation Age of Onset  . Anxiety disorder Mother   . Anxiety disorder Sister   . Bipolar disorder Daughter   . OCD Daughter   . ADD / ADHD Daughter   . Heart attack Father   . Anesthesia problems Neg Hx   . Hypotension Neg Hx   . Malignant hyperthermia Neg Hx   . Pseudochol deficiency Neg Hx   . Dementia Neg Hx   . Alcohol abuse Neg Hx   . Drug abuse Neg Hx   . Depression Neg Hx   . Paranoid behavior Neg Hx   . Schizophrenia Neg Hx   . Seizures Neg Hx   . Sexual abuse Neg Hx   . Physical abuse Neg Hx   . Colon cancer Neg Hx     Social History   Tobacco Use  . Smoking status:  Former Smoker    Packs/day: 1.00    Years: 30.00    Pack years: 30.00    Types: Cigarettes    Quit date: 01/18/1998    Years since quitting: 21.2  . Smokeless tobacco: Never Used  Substance Use Topics  . Alcohol use: No  . Drug use: No    Home Medications Prior to Admission medications   Medication Sig Start Date End Date Taking? Authorizing Provider  Cyanocobalamin (VITAMIN B-12 IJ) Inject 1 Applicatorful as directed every 30 (thirty) days.     [provider]  diclofenac Sodium (VOLTAREN) 1 % GEL APPLY A SMALL AMOUNT TO SKIN THREE TIMES DAILY AS DIRECTED 02/19/19   [provider]  furosemide (LASIX) 40 MG tablet Take 40 mg by mouth as needed.    [provider]  levothyroxine (SYNTHROID, LEVOTHROID) 75 MCG tablet Take 75 mcg by mouth daily before breakfast.  [provider]  NEXIUM 40 MG capsule Take 40 mg by mouth 2 (two) times daily. 01/16/19   [provider]  ondansetron (ZOFRAN) 4 MG tablet Take 1 tablet (4 mg total) by mouth every 8 (eight) hours as needed for nausea or vomiting. Patient taking differently: Take 4 mg by mouth as needed for nausea or vomiting.  03/26/19   Annitta Needs, NP  oxyCODONE-acetaminophen (PERCOCET) 5-325 MG tablet Take 1-2 tablets by mouth every 8 (eight) hours as needed for severe pain. Patient not taking: Reported on 04/03/2019 03/01/19   Carlis Stable, NP  traMADol Veatrice Bourbon) 50 MG tablet SMARTSIG:1 Tablet(s) By Mouth 1 to 2 Times Daily 03/21/19   [provider]  venlafaxine (EFFEXOR) 75 MG tablet Take 75 mg by mouth 2 (two) times daily.     [provider]  Vitamin D, Ergocalciferol, (DRISDOL) 50000 units CAPS capsule Take 50,000 Units by mouth every 7 (seven) days. Fridays    [provider]    Allergies    Benzodiazepines, Codeine, Escitalopram oxalate, Nsaids, Sertraline hcl, Statins, and Tramadol hcl  Review of Systems   Review of Systems  Constitutional: Negative for chills and  fever.  Respiratory: Negative for shortness of breath.   Cardiovascular: Positive for palpitations. Negative for chest pain.  Gastrointestinal: Positive for abdominal pain, constipation, nausea and vomiting.       Negative for melena.  Genitourinary: Negative for dysuria.  Neurological: Negative for syncope.  All other systems reviewed and are negative.   Physical Exam Updated Vital Signs BP (!) 194/86   Pulse 91   Temp 99.8 F (37.7 C) (Oral)   Resp 20   Ht 5\' 6"  (1.676 m)   Wt 99.8 kg   SpO2 100%   BMI 35.51 kg/m   Physical Exam Vitals and nursing note reviewed.  Constitutional:      General: She is in acute distress (mild, appears uncomfortable).     Appearance: She is well-developed. She is obese. She is not toxic-appearing.  HENT:     Head: Normocephalic and atraumatic.  Eyes:     General:        Right eye: No discharge.        Left eye: No discharge.     Conjunctiva/sclera: Conjunctivae normal.  Cardiovascular:     Rate and Rhythm: Normal rate and regular rhythm.  Pulmonary:     Effort: Pulmonary effort is normal. No respiratory distress.     Breath sounds: Normal breath sounds. No wheezing, rhonchi or rales.  Abdominal:     General: There is no distension.     Palpations: Abdomen is soft.     Tenderness: There is abdominal tenderness (Generalized, right side worse than left.). There is guarding and rebound.  Musculoskeletal:     Cervical back: Neck supple.  Skin:    General: Skin is warm and dry.     Findings: No rash.  Neurological:     Mental Status: She is alert.     Comments: Clear speech.   Psychiatric:        Behavior: Behavior normal.    ED Results / Procedures / Treatments   Labs (all labs ordered are listed, but only abnormal results are displayed) Labs Reviewed  COMPREHENSIVE METABOLIC PANEL - Abnormal; Notable for the following components:      Result Value   Glucose, Bld 131 (*)    Creatinine, Ser 1.21 (*)    GFR calc non Af Amer 45  (*)  GFR calc Af Amer 52 (*)    All other components within normal limits  CBC WITH DIFFERENTIAL/PLATELET - Abnormal; Notable for the following components:   RBC 3.72 (*)    Hemoglobin 9.6 (*)    HCT 31.9 (*)    MCH 25.8 (*)    RDW 16.3 (*)    All other components within normal limits  LIPASE, BLOOD  LACTIC ACID, PLASMA  URINALYSIS, ROUTINE W REFLEX MICROSCOPIC  LACTIC ACID, PLASMA    EKG EKG Interpretation  Date/Time:  Tuesday April 03 2019 10:52:59 EDT Ventricular Rate:  84 PR Interval:    QRS Duration: 84 QT Interval:  379 QTC Calculation: 448 R Axis:   -10 Text Interpretation: Sinus rhythm Ventricular premature complex Borderline short PR interval Borderline T wave abnormalities No acute changes No significant change since last tracing Confirmed by Varney Biles [id number]) on 04/03/2019 11:37:28 AM   Radiology CT Abdomen Pelvis W Contrast  Result Date: 04/03/2019 CLINICAL DATA:  74 year old female with history of abdominal pain for the past several months. Pain is worse during deep inspiration. EXAM: CT ABDOMEN AND PELVIS WITH CONTRAST TECHNIQUE: Multidetector CT imaging of the abdomen and pelvis was performed using the standard protocol following bolus administration of intravenous contrast. CONTRAST:  33mL OMNIPAQUE IOHEXOL 300 MG/ML  SOLN COMPARISON:  CT the abdomen and pelvis 03/01/2019. FINDINGS: Lower chest: Mild scarring in the lung bases bilaterally. Calcifications of the mitral annulus. Aortic atherosclerosis. Hepatobiliary: Subcentimeter low-attenuation lesion in segment 7 of the liver, too small to characterize, but statistically likely to represent a tiny cyst. No other suspicious hepatic lesions. Status post cholecystectomy. Minimal intrahepatic biliary ductal dilatation which appear stable compared to prior examinations. Common bile duct is slightly prominent measuring 8 mm in the porta hepatis, which is within normal limits for this post cholecystectomy patient.  Pancreas: No definite pancreatic mass or peripancreatic fluid collections or inflammatory changes. Spleen: Unremarkable. Adrenals/Urinary Tract: Bilateral kidneys and adrenal glands are normal in appearance. No hydroureteronephrosis. Urinary bladder is normal in appearance. Stomach/Bowel: Status post gastrojejunostomy. No pathologic dilatation of small bowel or colon. The appendix is not confidently identified and may be surgically absent. Regardless, there are no inflammatory changes noted adjacent to the cecum to suggest the presence of an acute appendicitis at this time. Vascular/Lymphatic: Aortic atherosclerosis, without evidence of aneurysm or dissection in the abdominal or pelvic vasculature. No lymphadenopathy noted in the abdomen or pelvis. Reproductive: Status post hysterectomy.  Ovaries are trophic. Other: No significant volume of ascites.  No pneumoperitoneum. Musculoskeletal: Status post PLIF at L4-L5 with interbody graft at L4-L5 interspace there are no aggressive appearing lytic or blastic lesions noted in the visualized portions of the skeleton. IMPRESSION: 1. No acute findings are noted in the abdomen or pelvis to account for the patient's symptoms. 2. Aortic atherosclerosis. 3. Postoperative changes and other chronic incidental findings, as above. Electronically Signed   By: Vinnie Langton M.D.   On: 04/03/2019 12:10   Procedures Procedures (including critical care time)  Medications Ordered in ED Medications  iohexol (OMNIPAQUE) 300 MG/ML solution 75 mL (has no administration in time range)  ondansetron (ZOFRAN) injection 4 mg (4 mg Intravenous Given 04/03/19 1106)  fentaNYL (SUBLIMAZE) injection 50 mcg (50 mcg Intravenous Given 04/03/19 1107)  sodium chloride 0.9 % bolus 500 mL (500 mLs Intravenous New Bag/Given 04/03/19 1106)    ED Course  I have reviewed the triage vital signs and the nursing notes.  Pertinent labs & imaging results that were available during  my care of the  patient were reviewed by me and considered in my medical decision making (see chart for details).  [name] was evaluated in Emergency Department on 04/03/2019 for the symptoms described in the history of present illness. He/she was evaluated in the context of the global COVID-19 pandemic, which necessitated consideration that the patient might be at risk for infection with the SARS-CoV-2 virus that causes COVID-19. Institutional protocols and algorithms that pertain to the evaluation of patients at risk for COVID-19 are in a state of rapid change based on information released by regulatory bodies including the CDC and federal and state organizations. These policies and algorithms were followed during the patient's care in the ED.    MDM Rules/Calculators/A&P                     Patient presents to the emergency department with progressively worsening abdominal discomfort over the past several weeks.  She appears uncomfortable, she is hypertensive, she has generalized abdominal tenderness that is worse on the right side compared to the left with guarding and rebound tenderness.  Plan for symptomatic control with labs and CT abdomen/pelvis.  CBC: Mild anemia slightly worsened from prior CMP: Somewhat elevated creatinine similar to prior.  Mild hyperglycemia.  No significant electrolyte derangement.  LFTs WNL. Lipase: WNL Lactic acid: WNL CT abdomen/pelvis:  1. No acute findings are noted in the abdomen or pelvis to account for the patient's symptoms. 2. Aortic atherosclerosis. 3. Postoperative changes and other chronic incidental findings, as above.  13:45: CONSULT: Discussed with gastroenterologist Dr. Gala Romney regarding work-up here today, specifically was concerned for "swirling fat" associated with an internal hernia- I will call and discuss with radiologist further on this.    13:57: CONSULT: Discussed with radiologist Dr. Weber Cooks- re-reviewed CT imaging- no signs of swirling fat to indicate  internal hernia.   15:05: RE-EVAL: Patient remains very uncomfortable, initial plan was to trial PO meds for pain control, but given her degree of discomfort will give fentanyl IV again. Plan to discuss with general surgery on call.   15:45: CONSULT: Discussed with general surgeon Dr. Constance Haw- will come see patient in ED.   Patient seen by Dr. Constance Haw, no need for current general surgery intervention, patient had been taking increased goodie powders when she had covid a few weeks ago, she does have a mild drop in her hgb, denies melena/hematochezia, subsequently discussed with Dr. Gala Romney & Dr. Constance Haw ultimately plan for hospitalist admission with EGD tomorrow. NPO after midnight, give protonix BID & give carafate. Appreciate consultations.   16:49: CONSULT: Discussed with hospitalist Dr. Denton Brick- accepts admission.   This is a shared visit with supervising physician Dr. Kathrynn Humble who has independently evaluated patient & provided guidance in evaluation/management/disposition, in agreement with care    Final Clinical Impression(s) / ED Diagnoses Final diagnoses:  Abdominal pain, unspecified abdominal location  Anemia, unspecified type    Rx / DC Orders ED Discharge Orders    None       Amaryllis Dyke, PA-C 04/03/19 [place], Ankit, MD 04/03/19 1712

## 2019-04-03 NOTE — ED Notes (Signed)
Dr. Bridges in to see pt 

## 2019-04-04 ENCOUNTER — Ambulatory Visit (HOSPITAL_COMMUNITY): Admission: RE | Admit: 2019-04-04 | Payer: Medicare Other | Source: Ambulatory Visit

## 2019-04-04 ENCOUNTER — Observation Stay (HOSPITAL_COMMUNITY): Payer: Medicare Other | Admitting: Anesthesiology

## 2019-04-04 ENCOUNTER — Encounter (HOSPITAL_COMMUNITY)
Admission: EM | Disposition: A | Discharge status: Home / Self Care | Payer: Self-pay | Source: Home / Self Care | Attending: Internal Medicine

## 2019-04-04 ENCOUNTER — Encounter (HOSPITAL_COMMUNITY): Payer: Self-pay | Admitting: Internal Medicine

## 2019-04-04 ENCOUNTER — Encounter (HOSPITAL_COMMUNITY): Payer: Self-pay

## 2019-04-04 DIAGNOSIS — K5909 Other constipation: Secondary | ICD-10-CM | POA: Diagnosis present

## 2019-04-04 DIAGNOSIS — D649 Anemia, unspecified: Secondary | ICD-10-CM | POA: Diagnosis present

## 2019-04-04 DIAGNOSIS — Z6835 Body mass index (BMI) 35.0-35.9, adult: Secondary | ICD-10-CM | POA: Diagnosis not present

## 2019-04-04 DIAGNOSIS — K449 Diaphragmatic hernia without obstruction or gangrene: Secondary | ICD-10-CM

## 2019-04-04 DIAGNOSIS — R1010 Upper abdominal pain, unspecified: Secondary | ICD-10-CM

## 2019-04-04 DIAGNOSIS — Z8249 Family history of ischemic heart disease and other diseases of the circulatory system: Secondary | ICD-10-CM | POA: Diagnosis not present

## 2019-04-04 DIAGNOSIS — E039 Hypothyroidism, unspecified: Secondary | ICD-10-CM | POA: Diagnosis present

## 2019-04-04 DIAGNOSIS — Z9884 Bariatric surgery status: Secondary | ICD-10-CM | POA: Diagnosis not present

## 2019-04-04 DIAGNOSIS — Z885 Allergy status to narcotic agent status: Secondary | ICD-10-CM | POA: Diagnosis not present

## 2019-04-04 DIAGNOSIS — E669 Obesity, unspecified: Secondary | ICD-10-CM | POA: Diagnosis present

## 2019-04-04 DIAGNOSIS — L732 Hidradenitis suppurativa: Secondary | ICD-10-CM | POA: Diagnosis present

## 2019-04-04 DIAGNOSIS — K439 Ventral hernia without obstruction or gangrene: Secondary | ICD-10-CM | POA: Diagnosis present

## 2019-04-04 DIAGNOSIS — Z981 Arthrodesis status: Secondary | ICD-10-CM | POA: Diagnosis not present

## 2019-04-04 DIAGNOSIS — Z96653 Presence of artificial knee joint, bilateral: Secondary | ICD-10-CM | POA: Diagnosis present

## 2019-04-04 DIAGNOSIS — I129 Hypertensive chronic kidney disease with stage 1 through stage 4 chronic kidney disease, or unspecified chronic kidney disease: Secondary | ICD-10-CM | POA: Diagnosis present

## 2019-04-04 DIAGNOSIS — N183 Chronic kidney disease, stage 3 unspecified: Secondary | ICD-10-CM | POA: Diagnosis present

## 2019-04-04 DIAGNOSIS — R109 Unspecified abdominal pain: Secondary | ICD-10-CM | POA: Diagnosis present

## 2019-04-04 DIAGNOSIS — Z818 Family history of other mental and behavioral disorders: Secondary | ICD-10-CM | POA: Diagnosis not present

## 2019-04-04 DIAGNOSIS — K289 Gastrojejunal ulcer, unspecified as acute or chronic, without hemorrhage or perforation: Secondary | ICD-10-CM | POA: Diagnosis present

## 2019-04-04 DIAGNOSIS — E7800 Pure hypercholesterolemia, unspecified: Secondary | ICD-10-CM | POA: Diagnosis present

## 2019-04-04 DIAGNOSIS — F418 Other specified anxiety disorders: Secondary | ICD-10-CM | POA: Diagnosis present

## 2019-04-04 DIAGNOSIS — R1084 Generalized abdominal pain: Secondary | ICD-10-CM

## 2019-04-04 DIAGNOSIS — K279 Peptic ulcer, site unspecified, unspecified as acute or chronic, without hemorrhage or perforation: Secondary | ICD-10-CM | POA: Diagnosis not present

## 2019-04-04 DIAGNOSIS — Z8616 Personal history of COVID-19: Secondary | ICD-10-CM | POA: Diagnosis not present

## 2019-04-04 DIAGNOSIS — Z888 Allergy status to other drugs, medicaments and biological substances status: Secondary | ICD-10-CM | POA: Diagnosis not present

## 2019-04-04 DIAGNOSIS — G4730 Sleep apnea, unspecified: Secondary | ICD-10-CM | POA: Diagnosis present

## 2019-04-04 DIAGNOSIS — K219 Gastro-esophageal reflux disease without esophagitis: Secondary | ICD-10-CM | POA: Diagnosis present

## 2019-04-04 DIAGNOSIS — Z20822 Contact with and (suspected) exposure to covid-19: Secondary | ICD-10-CM | POA: Diagnosis present

## 2019-04-04 DIAGNOSIS — Z886 Allergy status to analgesic agent status: Secondary | ICD-10-CM | POA: Diagnosis not present

## 2019-04-04 DIAGNOSIS — Z87891 Personal history of nicotine dependence: Secondary | ICD-10-CM | POA: Diagnosis not present

## 2019-04-04 HISTORY — PX: ESOPHAGOGASTRODUODENOSCOPY (EGD) WITH PROPOFOL: SHX5813

## 2019-04-04 LAB — CBC
HCT: 31.1 % — ABNORMAL LOW (ref 36.0–46.0)
Hemoglobin: 9 g/dL — ABNORMAL LOW (ref 12.0–15.0)
MCH: 25.5 pg — ABNORMAL LOW (ref 26.0–34.0)
MCHC: 28.9 g/dL — ABNORMAL LOW (ref 30.0–36.0)
MCV: 88.1 fL (ref 80.0–100.0)
Platelets: 296 10*3/uL (ref 150–400)
RBC: 3.53 MIL/uL — ABNORMAL LOW (ref 3.87–5.11)
RDW: 16.7 % — ABNORMAL HIGH (ref 11.5–15.5)
WBC: 5.7 10*3/uL (ref 4.0–10.5)
nRBC: 0 % (ref 0.0–0.2)

## 2019-04-04 LAB — RETICULOCYTES
Immature Retic Fract: 25.9 % — ABNORMAL HIGH (ref 2.3–15.9)
RBC.: 3.58 MIL/uL — ABNORMAL LOW (ref 3.87–5.11)
Retic Count, Absolute: 57.6 10*3/uL (ref 19.0–186.0)
Retic Ct Pct: 1.6 % (ref 0.4–3.1)

## 2019-04-04 LAB — IRON AND TIBC
Iron: 29 ug/dL (ref 28–170)
Saturation Ratios: 6 % — ABNORMAL LOW (ref 10.4–31.8)
TIBC: 482 ug/dL — ABNORMAL HIGH (ref 250–450)
UIBC: 453 ug/dL

## 2019-04-04 LAB — FERRITIN: Ferritin: 15 ng/mL (ref 11–307)

## 2019-04-04 LAB — VITAMIN B12: Vitamin B-12: 1022 pg/mL — ABNORMAL HIGH (ref 180–914)

## 2019-04-04 SURGERY — ESOPHAGOGASTRODUODENOSCOPY (EGD) WITH PROPOFOL
Anesthesia: General

## 2019-04-04 MED ORDER — LACTATED RINGERS IV SOLN: Freq: Once | INTRAVENOUS | Status: DC

## 2019-04-04 MED ORDER — STERILE WATER FOR IRRIGATION IR SOLN
Status: DC | PRN
  Administered 2019-04-04: 100 mL

## 2019-04-04 MED ORDER — FENTANYL CITRATE (PF) 100 MCG/2ML IJ SOLN: 25.0000 ug | INTRAMUSCULAR | Status: DC | PRN

## 2019-04-04 MED ORDER — LEVOTHYROXINE SODIUM 75 MCG PO TABS
75.0000 ug | ORAL_TABLET | Freq: Every day | ORAL | Status: DC
  Administered 2019-04-04 – 2019-04-05 (×2): 75 ug via ORAL
  Filled 2019-04-04 (×2): qty 1

## 2019-04-04 MED ORDER — VENLAFAXINE HCL 37.5 MG PO TABS
75.0000 mg | ORAL_TABLET | Freq: Two times a day (BID) | ORAL | Status: DC
  Administered 2019-04-04 – 2019-04-05 (×2): 75 mg via ORAL
  Filled 2019-04-04 (×2): qty 2

## 2019-04-04 MED ORDER — PANTOPRAZOLE SODIUM 40 MG PO TBEC
40.0000 mg | DELAYED_RELEASE_TABLET | Freq: Two times a day (BID) | ORAL | Status: DC
  Administered 2019-04-04 – 2019-04-05 (×2): 40 mg via ORAL
  Filled 2019-04-04 (×2): qty 1

## 2019-04-04 MED ORDER — LACTATED RINGERS IV SOLN: INTRAVENOUS | Status: DC | PRN

## 2019-04-04 MED ORDER — SODIUM CHLORIDE 0.9 % IV SOLN: INTRAVENOUS | Status: DC

## 2019-04-04 MED ORDER — PROPOFOL 500 MG/50ML IV EMUL
INTRAVENOUS | Status: DC | PRN
  Administered 2019-04-04: 150 ug/kg/min via INTRAVENOUS

## 2019-04-04 MED ORDER — PROPOFOL 10 MG/ML IV BOLUS
INTRAVENOUS | Status: DC | PRN
  Administered 2019-04-04 (×2): 20 mg via INTRAVENOUS

## 2019-04-04 NOTE — Anesthesia Postprocedure Evaluation (Signed)
Anesthesia Post Note  Patient: [name]  Procedure(s) Performed: ESOPHAGOGASTRODUODENOSCOPY (EGD) WITH PROPOFOL (N/A )  Patient location during evaluation: PACU Anesthesia Type: General Level of consciousness: awake and alert and oriented Pain management: pain level controlled Vital Signs Assessment: post-procedure vital signs reviewed and stable Respiratory status: spontaneous breathing Cardiovascular status: blood pressure returned to baseline and stable Postop Assessment: no apparent nausea or vomiting Anesthetic complications: no     Last Vitals:  Vitals:   04/04/19 1204 04/04/19 1238  BP: (!) 196/93 104/66  Pulse:  83  Resp: 20 12  Temp: 37.5 C 36.9 C  SpO2: 97% 98%    Last Pain:  Vitals:   04/04/19 1238  TempSrc:   PainSc: 0-No pain                 [place]

## 2019-04-04 NOTE — Interval H&P Note (Signed)
History and Physical Interval Note:  04/04/2019 12:03 PM  Stefanie Libel  has presented today for surgery, with the diagnosis of abdominal pain, h/o gastric bypass, anemia.  The various methods of treatment have been discussed with the patient and family. After consideration of risks, benefits and other options for treatment, the patient has consented to  Procedure(s): ESOPHAGOGASTRODUODENOSCOPY (EGD) WITH PROPOFOL (N/A) as a surgical intervention.  The patient's history has been reviewed, patient examined, no change in status, stable for surgery.  I have reviewed the patient's chart and labs.  Questions were answered to the patient's satisfaction.     Necie Wilcoxson   Patient's pain unrelieved with PPI/Carafate.  Notes a bulge when she strains right abdomen.  Indeed, she does have a ping-pong sized bulge right mid abdomen when she Valsalva's.  Focally tender to palpation   -  seems to be the emanation point for her abdominal pain. Agree with need for diagnostic EGD to help sort out.  Patient denies dysphagia.  Discussed with Dr. Constance Haw yesterday and anesthesia today.  Further recommendations to follow.

## 2019-04-04 NOTE — Progress Notes (Addendum)
Subjective:  [name] seen in office by Dr. Gala Romney yesterday for worsening of her right sided abdominal pain over the past couple of months. Due to significant abdominal pain was sent to ED for urgent evaluation. H/O gastric bypass in 2007. H/O Spigelian hernia on right, evaluated by surgery 2019 and they felt it was asymptomatic and did not recommend surgical intervention. H/O anastomotic ulcer in 2016 but none seen on EGD in 2019. Had Covid 01/2019, took at least 2 Goodies' powders a day and ibuprofen. Off Nexium due to insurance issues.   Her Hgb is down again, h/o IDA in setting of gastric bypass. Normal Hgb in 08/2017, down to 10.5 in 02/2019, Hgb 9 today.Ferritin low normal at 15.  [name] reports worsening right sided abdominal pain but also with pain in entire upper abdomen. Associated with nausea/dry heaves. No melena, brbpr. Has intermittent bulge in right flank area there for several years, very painful.   Objective: Vital signs in last 24 hours: Temp:  [97.1 F (36.2 C)-99.8 F (37.7 C)] 98.4 F (36.9 C) (03/17 0606) Pulse Rate:  [74-96] 80 (03/17 0606) Resp:  [13-26] 20 (03/17 0606) BP: (126-210)/(63-109) 137/73 (03/17 0606) SpO2:  [97 %-100 %] 97 % (03/17 0606) Weight:  [99.6 kg-99.8 kg] 99.8 kg (03/16 1920) Last BM Date: 04/01/19 General:   Alert,  Well-developed, well-nourished, pleasant and cooperative in NAD Head:  Normocephalic and atraumatic. Eyes:  Sclera clear, no icterus.  Abdomen:  Soft, obese. Bulging area in right flank, not present initially on exam but with [name] manipulation became present. Fist size. Tender to palpation. Epigastric tenderness. +bowel sounds.   Extremities:  Without clubbing, deformity or edema. Neurologic:  Alert and  oriented x4;  grossly normal neurologically. Skin:  Intact without significant lesions or rashes. Psych:  Alert and cooperative. Normal mood and affect.  Intake/Output from previous day: 03/16 0701 - 03/17 0700 In: 740 [P.O.:240;  IV Piggyback:500] Out: 400 [Urine:400] Intake/Output this shift: No intake/output data recorded.  Lab Results: CBC Recent Labs    04/03/19 1053 04/04/19 0710  WBC 8.5 5.7  HGB 9.6* 9.0*  HCT 31.9* 31.1*  MCV 85.8 88.1  PLT 319 296   BMET Recent Labs    04/03/19 1053  NA 140  K 3.9  CL 103  CO2 25  GLUCOSE 131*  BUN 18  CREATININE 1.21*  CALCIUM 9.7   LFTs Recent Labs    04/03/19 1053  BILITOT 0.4  ALKPHOS 68  AST 17  ALT 12  PROT 7.2  ALBUMIN 4.0   Recent Labs    04/03/19 1053  LIPASE 26    Lab Results  Component Value Date   IRON 29 04/04/2019   TIBC 482 (H) 04/04/2019   FERRITIN 15 04/04/2019   Lab Results  Component Value Date   VITAMINB12 1,022 (H) 04/04/2019   Lab Results  Component Value Date   FOLATE 7.8 04/22/2015      PT/INR No results for input(s): LABPROT, INR in the last 72 hours.    Imaging Studies: CT Abdomen Pelvis W Contrast  Result Date: 04/03/2019 CLINICAL DATA:  74 year old female with history of abdominal pain for the past several months. Pain is worse during deep inspiration. EXAM: CT ABDOMEN AND PELVIS WITH CONTRAST TECHNIQUE: Multidetector CT imaging of the abdomen and pelvis was performed using the standard protocol following bolus administration of intravenous contrast. CONTRAST:  88mL OMNIPAQUE IOHEXOL 300 MG/ML  SOLN COMPARISON:  CT the abdomen and pelvis 03/01/2019. FINDINGS: Lower chest: Mild scarring  in the lung bases bilaterally. Calcifications of the mitral annulus. Aortic atherosclerosis. Hepatobiliary: Subcentimeter low-attenuation lesion in segment 7 of the liver, too small to characterize, but statistically likely to represent a tiny cyst. No other suspicious hepatic lesions. Status post cholecystectomy. Minimal intrahepatic biliary ductal dilatation which appear stable compared to prior examinations. Common bile duct is slightly prominent measuring 8 mm in the porta hepatis, which is within normal limits for  this post cholecystectomy [name]. Pancreas: No definite pancreatic mass or peripancreatic fluid collections or inflammatory changes. Spleen: Unremarkable. Adrenals/Urinary Tract: Bilateral kidneys and adrenal glands are normal in appearance. No hydroureteronephrosis. Urinary bladder is normal in appearance. Stomach/Bowel: Status post gastrojejunostomy. No pathologic dilatation of small bowel or colon. The appendix is not confidently identified and may be surgically absent. Regardless, there are no inflammatory changes noted adjacent to the cecum to suggest the presence of an acute appendicitis at this time. Vascular/Lymphatic: Aortic atherosclerosis, without evidence of aneurysm or dissection in the abdominal or pelvic vasculature. No lymphadenopathy noted in the abdomen or pelvis. Reproductive: Status post hysterectomy.  Ovaries are trophic. Other: No significant volume of ascites.  No pneumoperitoneum. Musculoskeletal: Status post PLIF at L4-L5 with interbody graft at L4-L5 interspace there are no aggressive appearing lytic or blastic lesions noted in the visualized portions of the skeleton. IMPRESSION: 1. No acute findings are noted in the abdomen or pelvis to account for the [name]'s symptoms. 2. Aortic atherosclerosis. 3. Postoperative changes and other chronic incidental findings, as above. Electronically Signed   By: Vinnie Langton M.D.   On: 04/03/2019 12:10  [2 weeks]   Assessment:  74 y/o female with h/o remote gastric bypass in 2007 associated with IDA in past. H/O Spigelian hernia on right, evaluated by surgery 2019 and they felt it was asymptomatic and did not recommend surgical intervention. H/O anastomotic ulcer in 2016 but none seen on EGD in 2019. [name] presented with worsening of her chronic right sided abdominal pain. CT yesterday without explanation for her abdominal pain. Evaluated by general surgery, no indication for surgical intervention at this time. [name]  had Covid 01/2019,  took at least 2 Goodies' powders a day and ibuprofen. Off Nexium due to insurance issues. Given these findings, EGD recommended to evaluate her worsening right sided abdominal pain.  Right flank bulge on exam/tender to palpation. Reviewed CT with Dr. Thornton Papas. She has one inch side Spigelian hernia without bowel involvement at location. No evidence of internal herniation related to gastric bypass.  Plan: EGD today with propofol.  I have discussed the risks, alternatives, benefits with regards to but not limited to the risk of reaction to medication, bleeding, infection, perforation and the [name] is agreeable to proceed. Written consent to be obtained.   Laureen Ochs. Bernarda Caffey [facility] [phone] 3/17/20219:20 AM Attending note: See separate updated H&P.  [name] seen and examined in short stay.  Agree with need for EGD as outlined.  The risks, benefits, limitations, alternatives and imponderables have been reviewed with the [name]. Potential for esophageal dilation, biopsy, etc. have also been reviewed.  Questions have been answered. All parties agreeable.     LOS: 0 days

## 2019-04-04 NOTE — Anesthesia Preprocedure Evaluation (Addendum)
Anesthesia Evaluation  Patient identified by MRN, date of birth, ID band Patient awake    Reviewed: Allergy & Precautions, NPO status , Patient's Chart, lab work & pertinent test results  History of Anesthesia Complications (+) PONV and history of anesthetic complications  Airway Mallampati: III  TM Distance: >3 FB Neck ROM: Full    Dental  (+) Dental Advisory Given, Poor Dentition, Missing, Chipped   Pulmonary neg pulmonary ROS, shortness of breath, sleep apnea , Recent URI , Resolved, former smoker,  States unable to tolerate CPAP Denies O2 use smoking, or breathing meds    Pulmonary exam normal breath sounds clear to auscultation       Cardiovascular Exercise Tolerance: Poor hypertension, Pt. on medications Normal cardiovascular examII Rhythm:Regular Rate:Normal  S/p fall two weeks ago -now using walker  S/p Gastric restrictive procedure remotely -c/w GI issues  hasnt taken propranolol in the last two days    Neuro/Psych  Headaches, PSYCHIATRIC DISORDERS Anxiety Depression  Neuromuscular disease negative neurological ROS  negative psych ROS   GI/Hepatic negative GI ROS, Neg liver ROS, hiatal hernia, PUD, GERD  Medicated and Controlled,Abdominal pain and nausea   Endo/Other  negative endocrine ROSHypothyroidism   Renal/GU Renal InsufficiencyRenal diseasenegative Renal ROS  negative genitourinary   Musculoskeletal negative musculoskeletal ROS (+) Arthritis , Osteoarthritis,    Abdominal   Peds negative pediatric ROS (+)  Hematology negative hematology ROS (+) anemia ,   Anesthesia Other Findings   Reproductive/Obstetrics negative OB ROS                           Anesthesia Physical  Anesthesia Plan  ASA: III and emergent  Anesthesia Plan: General   Post-op Pain Management:    Induction: Intravenous  PONV Risk Score and Plan: 4 or greater and TIVA and Propofol infusion  Airway  Management Planned: Simple Face Mask, Nasal Cannula and Natural Airway  Additional Equipment:   Intra-op Plan:   Post-operative Plan: Possible Post-op intubation/ventilation  Informed Consent: I have reviewed the patients History and Physical, chart, labs and discussed the procedure including the risks, benefits and alternatives for the proposed anesthesia with the patient or authorized representative who has indicated his/her understanding and acceptance.     Dental advisory given  Plan Discussed with: CRNA  Anesthesia Plan Comments: (Possible GA with ETT was discussed.)       Anesthesia Quick Evaluation

## 2019-04-04 NOTE — Transfer of Care (Signed)
Immediate Anesthesia Transfer of Care Note  Patient: [name]  Procedure(s) Performed: ESOPHAGOGASTRODUODENOSCOPY (EGD) WITH PROPOFOL (N/A )  Patient Location: PACU  Anesthesia Type:General  Level of Consciousness: awake  Airway & Oxygen Therapy: Patient Spontanous Breathing  Post-op Assessment: Report given to RN  Post vital signs: Reviewed  Last Vitals:  Vitals Value Taken Time  BP 104/66 04/04/19 1238  Temp    Pulse 83 04/04/19 1241  Resp 17 04/04/19 1241  SpO2 98 % 04/04/19 1241  Vitals shown include unvalidated device data.  Last Pain:  Vitals:   04/04/19 1232  TempSrc:   PainSc: 0-No pain      Patients Stated Pain Goal: 10 ([phone])  Complications: No apparent anesthesia complications

## 2019-04-04 NOTE — Op Note (Signed)
[facility] Patient Name: [name] Procedure Date: 04/04/2019 12:03 PM MRN: [id number] Date of Birth: 1945/02/24 Attending MD: Norvel Richards , MD CSN: [id number] Age: 74 Admit Type: Inpatient Procedure:                Upper GI endoscopy Indications:              Generalized abdominal pain Providers:                Norvel Richards, MD, Rosina Lowenstein, RN, Randa Spike, Technician Referring MD:              Medicines:                Propofol per Anesthesia Complications:            No immediate complications. Estimated Blood Loss:     Estimated blood loss: none. Procedure:                After obtaining informed consent, the endoscope was                            passed under direct vision. Throughout the                            procedure, the patient's blood pressure, pulse, and                            oxygen saturations were monitored continuously. The                            [id number]) was introduced through the                            mouth, and advanced to the efferent jejunal loop.                            The upper GI endoscopy was accomplished without                            difficulty. The patient tolerated the procedure                            well. Scope In: 12:25:33 PM Scope Out: 12:31:49 PM Total Procedure Duration: 0 hours 6 minutes 16 seconds  Findings:      The examined esophagus was normal. Surgically altered stomach. Small       hiatal hernia. Patient has a gastrojejunostomy type configuration with       patent efferent limb. Afferentt limbs ends in a blind pouch. 2 cm ulcer       crater the anastomosis with smaller satellite areas of ulceration. Ulcer       base appeared raw but no bleeding stigmata. Biopsies taken. Impression:               -Status post prior gastric obesity procedure as  described. Large anastomotic ulcer with smaller                             satellite ulcers. Hiatal hernia.- No specimens                            collected. No doubt some of her pain could be                            emanating from her peptic ulcer disease. She does                            have significant localized discomfort in the area                            of the right-sided abdominal wall hernia. Bulge                            through her abdominal wall musculature varies size                            directly with degree of Valsalva maneuver. Moderate Sedation:      Moderate (conscious) sedation was personally administered by an       anesthesia professional. The following parameters were monitored: oxygen       saturation, heart rate, blood pressure, respiratory rate, EKG, adequacy       of pulmonary ventilation, and response to care. Recommendation:           - Return patient to hospital ward for ongoing care.                           - Clear liquid diet.                           - Continue present medications(Protonix 40 mg twice                            daily x12 weeks?"minimum). Carafate 1 g slurry 4                            times daily x10 days. Repeat EGD in 3 months.                            Absolutely avoid all forms of aspirin/NSAID                            products. Defer management of symptomatic abdominal                            wall hernia to Dr. Constance Haw.- Repeat upper endoscopy                            in 3 months for surveillance.                           -  Return to GI clinic in 10 weeks. Procedure Code(s):        --- Professional ---                           [phone], Esophagogastroduodenoscopy, flexible,                            transoral; diagnostic, including collection of                            specimen(s) by brushing or washing, when performed                            (separate procedure) Diagnosis Code(s):        --- Professional ---                           R10.84, Generalized abdominal  pain CPT copyright 2019 American Medical Association. All rights reserved. The codes documented in this report are preliminary and upon coder review may  be revised to meet current compliance requirements. Cristopher Estimable. Willine Schwalbe, MD Norvel Richards, MD 04/04/2019 12:49:33 PM This report has been signed electronically. Number of Addenda: 0

## 2019-04-04 NOTE — Progress Notes (Signed)
PROGRESS NOTE    [name]  [id number] DOB: Nov 21, 1945 DOA: 04/03/2019 PCP: Lemmie Evens, MD   Brief Narrative:  Per HPI:  [name] is a 74 y.o. female with medical history significant for depression and anxiety, hypertension, hypothyroidism, CKD 3.  Patient presented to the ED with complaints of abdominal pain right-sided over the past 2 months increasing in severity.  She presented to see her gastroenterologist today, she appeared to be splinting, was referred to the ED. She denies melena, blood in stools or vomiting of blood.  Patient has been taking Goody powders and ibuprofen about 2 - 3 months ago when she was diagnosed with COVID but she didn't not require hospitalization for symptoms. She has since stopped taking NSAIDS.  She denies pain with urination or change in urinary frequency. She talks lasix as needed for leg swelling.  3/17: Patient has undergone EGD with large anastomotic ulcer noted.  Continue PPI twice daily as well as Carafate.  Still feeling unwell and would like to see if diet advancement can be tolerated.  We will plan to advance diet today and reevaluate in a.m. for discharge.  Assessment & Plan:   Active Problems:   Abdominal pain   H/O gastric bypass   Anemia   Abdominal pain with acute anemia secondary to large anastomotic ulcer -Status post EGD today with plans to continue PPI twice daily as well as Carafate -Clear liquid diet and advance to diet as tolerated -Follow-up with general surgery in outpatient setting for suspected internal hernia -Plan for discharge in a.m. if tolerating diet  Depression/anxiety-stable -Venlafaxine  Hypothyroidism -Synthroid  CKD stage III-stable -Creatinine 1.2  Hypertension -Lasix currently held -IV labetalol as needed  DVT prophylaxis:SCDs Code Status: Full Family Communication: None at bedside Disposition Plan: Anticipate discharge in a.m. if tolerating diet and stable.   Consultants:    GI  Procedures:   EGD 3/17  Antimicrobials:   None   Subjective: Patient seen and evaluated today with ongoing abdominal pain as well as nausea.  Difficulty with tolerating diet at this point.  Objective: Vitals:   04/04/19 1238 04/04/19 1245 04/04/19 1300 04/04/19 1318  BP: 104/66 (!) 167/87 (!) 156/93 (!) 186/92  Pulse: 83 77 74 70  Resp: 12 16 10 17   Temp: 98.4 F (36.9 C)   98.5 F (36.9 C)  TempSrc:    Oral  SpO2: 98% 98% 99% 100%  Weight:      Height:        Intake/Output Summary (Last 24 hours) at 04/04/2019 1611 Last data filed at 04/04/2019 1240 Gross per 24 hour  Intake 640 ml  Output 400 ml  Net 240 ml   Filed Weights   04/03/19 1033 04/03/19 1920  Weight: 99.8 kg 99.8 kg    Examination:  General exam: Appears calm and comfortable, obese Respiratory system: Clear to auscultation. Respiratory effort normal. Cardiovascular system: S1 & S2 heard, RRR. No JVD, murmurs, rubs, gallops or clicks. No pedal edema. Gastrointestinal system: Abdomen is nondistended, soft and nontender. No organomegaly or masses felt. Normal bowel sounds heard. Central nervous system: Alert and oriented. No focal neurological deficits. Extremities: Symmetric 5 x 5 power. Skin: No rashes, lesions or ulcers Psychiatry: Judgement and insight appear normal. Mood & affect appropriate.     Data Reviewed: I have personally reviewed following labs and imaging studies  CBC: Recent Labs  Lab 04/03/19 1053 04/04/19 0710  WBC 8.5 5.7  NEUTROABS 5.9  --   HGB 9.6*  9.0*  HCT 31.9* 31.1*  MCV 85.8 88.1  PLT 319 0000000   Basic Metabolic Panel: Recent Labs  Lab 04/03/19 1053  NA 140  K 3.9  CL 103  CO2 25  GLUCOSE 131*  BUN 18  CREATININE 1.21*  CALCIUM 9.7   GFR: Estimated Creatinine Clearance: 50.1 mL/min (A) (by C-G formula based on SCr of 1.21 mg/dL (H)). Liver Function Tests: Recent Labs  Lab 04/03/19 1053  AST 17  ALT 12  ALKPHOS 68  BILITOT 0.4  PROT 7.2   ALBUMIN 4.0   Recent Labs  Lab 04/03/19 1053  LIPASE 26   No results for input(s): AMMONIA in the last 168 hours. Coagulation Profile: No results for input(s): INR, PROTIME in the last 168 hours. Cardiac Enzymes: No results for input(s): CKTOTAL, CKMB, CKMBINDEX, TROPONINI in the last 168 hours. BNP (last 3 results) No results for input(s): PROBNP in the last 8760 hours. HbA1C: No results for input(s): HGBA1C in the last 72 hours. CBG: No results for input(s): GLUCAP in the last 168 hours. Lipid Profile: No results for input(s): CHOL, HDL, LDLCALC, TRIG, CHOLHDL, LDLDIRECT in the last 72 hours. Thyroid Function Tests: No results for input(s): TSH, T4TOTAL, FREET4, T3FREE, THYROIDAB in the last 72 hours. Anemia Panel: Recent Labs    04/04/19 0516 04/04/19 0710  VITAMINB12 1,022*  --   FERRITIN 15  --   TIBC 482*  --   IRON 29  --   RETICCTPCT  --  1.6   Sepsis Labs: Recent Labs  Lab 04/03/19 1053 04/03/19 1245  LATICACIDVEN 1.5 1.3    Recent Results (from the past 240 hour(s))  SARS CORONAVIRUS 2 (TAT 6-24 HRS) Nasopharyngeal Nasopharyngeal Swab     Status: None   Collection Time: 04/03/19  6:13 PM   Specimen: Nasopharyngeal Swab  Result Value Ref Range Status   SARS Coronavirus 2 NEGATIVE NEGATIVE Final    Comment: (NOTE) SARS-CoV-2 target nucleic acids are NOT DETECTED. The SARS-CoV-2 RNA is generally detectable in upper and lower respiratory specimens during the acute phase of infection. Negative results do not preclude SARS-CoV-2 infection, do not rule out co-infections with other pathogens, and should not be used as the sole basis for treatment or other patient management decisions. Negative results must be combined with clinical observations, patient history, and epidemiological information. The expected result is Negative. Fact Sheet for Patients: SugarRoll.be Fact Sheet for Healthcare  Providers: https://www.woods-mathews.com/ This test is not yet approved or cleared by the [place] FDA and  has been authorized for detection and/or diagnosis of SARS-CoV-2 by FDA under an Emergency Use Authorization (EUA). This EUA will remain  in effect (meaning this test can be used) for the duration of the COVID-19 declaration under Section 56 4(b)(1) of the Act, 21 U.S.C. section 360bbb-3(b)(1), unless the authorization is terminated or revoked sooner. Performed at [facility], [place] [address], [place], [place] [address]          Radiology Studies: CT Abdomen Pelvis W Contrast  Result Date: 04/03/2019 CLINICAL DATA:  74 year old female with history of abdominal pain for the past several months. Pain is worse during deep inspiration. EXAM: CT ABDOMEN AND PELVIS WITH CONTRAST TECHNIQUE: Multidetector CT imaging of the abdomen and pelvis was performed using the standard protocol following bolus administration of intravenous contrast. CONTRAST:  41mL OMNIPAQUE IOHEXOL 300 MG/ML  SOLN COMPARISON:  CT the abdomen and pelvis 03/01/2019. FINDINGS: Lower chest: Mild scarring in the lung bases bilaterally. Calcifications of the  mitral annulus. Aortic atherosclerosis. Hepatobiliary: Subcentimeter low-attenuation lesion in segment 7 of the liver, too small to characterize, but statistically likely to represent a tiny cyst. No other suspicious hepatic lesions. Status post cholecystectomy. Minimal intrahepatic biliary ductal dilatation which appear stable compared to prior examinations. Common bile duct is slightly prominent measuring 8 mm in the porta hepatis, which is within normal limits for this post cholecystectomy patient. Pancreas: No definite pancreatic mass or peripancreatic fluid collections or inflammatory changes. Spleen: Unremarkable. Adrenals/Urinary Tract: Bilateral kidneys and adrenal glands are normal in appearance. No hydroureteronephrosis. Urinary bladder is  normal in appearance. Stomach/Bowel: Status post gastrojejunostomy. No pathologic dilatation of small bowel or colon. The appendix is not confidently identified and may be surgically absent. Regardless, there are no inflammatory changes noted adjacent to the cecum to suggest the presence of an acute appendicitis at this time. Vascular/Lymphatic: Aortic atherosclerosis, without evidence of aneurysm or dissection in the abdominal or pelvic vasculature. No lymphadenopathy noted in the abdomen or pelvis. Reproductive: Status post hysterectomy.  Ovaries are trophic. Other: No significant volume of ascites.  No pneumoperitoneum. Musculoskeletal: Status post PLIF at L4-L5 with interbody graft at L4-L5 interspace there are no aggressive appearing lytic or blastic lesions noted in the visualized portions of the skeleton. IMPRESSION: 1. No acute findings are noted in the abdomen or pelvis to account for the patient's symptoms. 2. Aortic atherosclerosis. 3. Postoperative changes and other chronic incidental findings, as above. Electronically Signed   By: Vinnie Langton M.D.   On: 04/03/2019 12:10        Scheduled Meds: . levothyroxine  75 mcg Oral QAC breakfast  . pantoprazole  40 mg Oral BID AC  . sucralfate  1 g Oral TID WC & HS  . venlafaxine  75 mg Oral BID   Continuous Infusions:   LOS: 0 days    Time spent: 30 minutes    Dekker Verga Darleen Crocker, DO [facility]  If 7PM-7AM, please contact night-coverage [email or web address] 04/04/2019, 4:11 PM

## 2019-04-05 ENCOUNTER — Telehealth: Payer: Self-pay | Admitting: Gastroenterology

## 2019-04-05 ENCOUNTER — Encounter: Payer: Self-pay | Admitting: Gastroenterology

## 2019-04-05 DIAGNOSIS — D649 Anemia, unspecified: Secondary | ICD-10-CM

## 2019-04-05 DIAGNOSIS — K289 Gastrojejunal ulcer, unspecified as acute or chronic, without hemorrhage or perforation: Principal | ICD-10-CM

## 2019-04-05 LAB — CBC
HCT: 29.9 % — ABNORMAL LOW (ref 36.0–46.0)
Hemoglobin: 8.8 g/dL — ABNORMAL LOW (ref 12.0–15.0)
MCH: 25.7 pg — ABNORMAL LOW (ref 26.0–34.0)
MCHC: 29.4 g/dL — ABNORMAL LOW (ref 30.0–36.0)
MCV: 87.2 fL (ref 80.0–100.0)
Platelets: 277 10*3/uL (ref 150–400)
RBC: 3.43 MIL/uL — ABNORMAL LOW (ref 3.87–5.11)
RDW: 16.3 % — ABNORMAL HIGH (ref 11.5–15.5)
WBC: 5 10*3/uL (ref 4.0–10.5)
nRBC: 0 % (ref 0.0–0.2)

## 2019-04-05 LAB — BASIC METABOLIC PANEL
Anion gap: 11 (ref 5–15)
BUN: 17 mg/dL (ref 8–23)
CO2: 27 mmol/L (ref 22–32)
Calcium: 9.3 mg/dL (ref 8.9–10.3)
Chloride: 103 mmol/L (ref 98–111)
Creatinine, Ser: 1.4 mg/dL — ABNORMAL HIGH (ref 0.44–1.00)
GFR calc Af Amer: 43 mL/min — ABNORMAL LOW (ref >60)
GFR calc non Af Amer: 37 mL/min — ABNORMAL LOW (ref >60)
Glucose, Bld: 110 mg/dL — ABNORMAL HIGH (ref 70–99)
Potassium: 4 mmol/L (ref 3.5–5.1)
Sodium: 141 mmol/L (ref 135–145)

## 2019-04-05 LAB — FOLATE RBC
Folate, Hemolysate: 265 ng/mL
Folate, RBC: 923 ng/mL (ref >498)
Hematocrit: 28.7 % — ABNORMAL LOW (ref 34.0–46.6)

## 2019-04-05 MED ORDER — POLYETHYLENE GLYCOL 3350 17 G PO PACK: 17.0000 g | PACK | Freq: Every day | ORAL | 0 refills | Status: DC | PRN

## 2019-04-05 MED ORDER — PANTOPRAZOLE SODIUM 40 MG PO TBEC: 40.0000 mg | DELAYED_RELEASE_TABLET | Freq: Two times a day (BID) | ORAL | 1 refills | Status: DC

## 2019-04-05 MED ORDER — OXYCODONE-ACETAMINOPHEN 5-325 MG PO TABS: 1.0000 | ORAL_TABLET | Freq: Three times a day (TID) | ORAL | 0 refills | Status: DC | PRN

## 2019-04-05 MED ORDER — SUCRALFATE 1 GM/10ML PO SUSP: 1.0000 g | Freq: Three times a day (TID) | ORAL | 0 refills | Status: DC

## 2019-04-05 NOTE — Progress Notes (Signed)
[facility]  Patient with anastomotic ulcer on EGD as expected as she is s/p gastric bypass and used excessive NSAIDs and Goodies. She is now tolerating a diet and having pain control with oral medication.  BP 131/61 (BP Location: Right Arm)   Pulse 72   Temp 98.7 F (37.1 C) (Oral)   Resp 12   Ht 5\' 6"  (1.676 m)   Wt 99.8 kg   SpO2 95%   BMI 35.51 kg/m   NAD Normal work of breathing Soft, non-distended, tender epigastric area  Patient s/p gastric bypass at [facility] in 2008. She has anastomotic ulcer and multiple satellite ulcers on her EGD.  She has chronic abdominal pain that proceeds this and has been seen by Dr. Arnoldo Morale in the past. She has a known spigelian hernia with small amount of fat in it, but this likely does not explain her chronic symptoms. No internal hernia on CT after her gastric bypass but this is no t always seen on imaging. If she continues to have chronic abdominal pain after her ulcers have resolved, after repeat EGD, then it would be reasonable to refer her to a bariatric surgeon to discuss if she qualifies to have an exploration to rule out internal hernia.  Again this may all be chronic abdominal pain that has no correctable etiology.  Dr. Kieth Brightly does bariatric surgery in [place].   No acute surgical intervention required at this time.  Curlene Labrum, MD [facility] [address] [place], [place] [address] [phone] (office)

## 2019-04-05 NOTE — Telephone Encounter (Signed)
Please arrange for hospital follow up in 8 weeks to schedule surveillance EGD for history of anastomotic ulcers.

## 2019-04-05 NOTE — Plan of Care (Signed)
  Problem: Nutrition: Goal: Adequate nutrition will be maintained Outcome: Progressing   Problem: Pain Managment: Goal: General experience of comfort will improve Outcome: Progressing   

## 2019-04-05 NOTE — Discharge Summary (Signed)
Physician Discharge Summary  [name] [id number] DOB: December 24, 1945 DOA: 04/03/2019  PCP: Lemmie Evens, MD  Admit date: 04/03/2019  Discharge date: 04/05/2019  Admitted From:Home  Disposition:  Home  Recommendations for Outpatient Follow-up:  1. Follow up with PCP in 1-2 weeks, follow-up CBC in 1 week 2. Follow-up with Dr. Gala Romney with GI as recommended in 8 weeks 3. Continue on PPI IV twice daily for at least 12 weeks and continue on Carafate for the next 10 days as prescribed 4. Percocet 10 tablets with 0 refills given as needed for severe pain 5. Patient given information about Dr. Kieth Brightly with general surgery for further assessment of internal abdominal hernias.  Recommended that she follow-up after healing of ulcer documented.  Home Health: None  Equipment/Devices: None  Discharge Condition: Stable  CODE STATUS: Full  Diet recommendation: Heart Healthy  Brief/Interim Summary: Per HPI: [name] a 74 y.o.femalewith medical history significant fordepression and anxiety, hypertension, hypothyroidism, CKD3.Patient presented to the ED with complaints of abdominal pain right-sided over the past 2 months increasing in severity. She presented to see her gastroenterologist today, she appearedto be splinting, was referred to the ED.She denies melena,blood in stools orvomiting of blood. Patient has been taking Goody powders and ibuprofen about 2 - 3 months ago when she was diagnosed with COVID but she didn't not require hospitalization for symptoms. She has since stopped taking NSAIDS. She denies pain with urination or change in urinary frequency. She talks lasix as needed for leg swelling.  3/17: Patient has undergone EGD with large anastomotic ulcer noted.  Continue PPI twice daily as well as Carafate.  Still feeling unwell and would like to see if diet advancement can be tolerated.  We will plan to advance diet today and reevaluate in a.m. for  discharge.  3/18: Patient having minimal abdominal pain this morning, but is worried about eating and pain that is associated with this.  I have discussed that this is largely related to her ulcer and she will need to remain on her PPI twice daily as well as Carafate as prescribed.  She will be given some narcotic pain medications to assist her for the next several days, but she is otherwise stable for discharge with close follow-up arranged with GI and likely need for follow-up with general surgery in the near future.  Hemoglobin levels remained stable at 8.8 and no overt bleeding identified overnight.  Discharge Diagnoses:  Active Problems:   Abdominal pain   H/O gastric bypass   Anemia   Acute anemia  Principal discharge diagnosis: Abdominal pain with acute anemia related to large anastomotic ulcer noted on EGD 3/17.  No acute bleeding.  Discharge Instructions  Discharge Instructions    Diet - low sodium heart healthy   Complete by: As directed    Increase activity slowly   Complete by: As directed      Allergies as of 04/05/2019      Reactions   Benzodiazepines Other (See Comments)   Cloudy thinking, memory loss, withdrawal symptoms when trying to stop without any other medication for detox.    Codeine Nausea Only   Escitalopram Oxalate Nausea Only   Nsaids Other (See Comments)   Bleeding ulcer    Sertraline Hcl Nausea Only   Statins Nausea And Vomiting   Tramadol Hcl Other (See Comments)   Daughter doesn't want mother to take due to the fact that the medication made the daughter have a seizure       Medication  List    STOP taking these medications   NexIUM 40 MG capsule Generic drug: esomeprazole     TAKE these medications   diclofenac Sodium 1 % Gel Commonly known as: VOLTAREN Apply 2 g topically 4 (four) times daily.   furosemide 40 MG tablet Commonly known as: LASIX Take 40 mg by mouth as needed.   levothyroxine 75 MCG tablet Commonly known as:  SYNTHROID Take 75 mcg by mouth daily before breakfast.   ondansetron 4 MG tablet Commonly known as: ZOFRAN Take 1 tablet (4 mg total) by mouth every 8 (eight) hours as needed for nausea or vomiting. What changed: when to take this   oxyCODONE-acetaminophen 5-325 MG tablet Commonly known as: Percocet Take 1-2 tablets by mouth every 8 (eight) hours as needed for severe pain.   pantoprazole 40 MG tablet Commonly known as: PROTONIX Take 1 tablet (40 mg total) by mouth 2 (two) times daily before a meal.   polyethylene glycol 17 g packet Commonly known as: MIRALAX / GLYCOLAX Take 17 g by mouth daily as needed for mild constipation.   sucralfate 1 GM/10ML suspension Commonly known as: CARAFATE Take 10 mLs (1 g total) by mouth 4 (four) times daily -  with meals and at bedtime for 10 days.   traMADol 50 MG tablet Commonly known as: ULTRAM SMARTSIG:1 Tablet(s) By Mouth 1 to 2 Times Daily   venlafaxine 75 MG tablet Commonly known as: EFFEXOR Take 75 mg by mouth 2 (two) times daily.   VITAMIN 0000000 IJ Inject 1 Applicatorful as directed every 30 (thirty) days.   Vitamin D (Ergocalciferol) 1.25 MG (50000 UNIT) Caps capsule Commonly known as: DRISDOL Take 50,000 Units by mouth every 7 (seven) days. Fridays      Follow-up Information    Lemmie Evens, MD Follow up in 1 week(s).   Specialty: Family Medicine Contact information: [place] [address] [phone]        Daneil Dolin, MD Follow up in 8 week(s).   Specialty: Gastroenterology Contact information: [place] [address] [phone]        Kinsinger, Arta Bruce, MD Follow up in 8 week(s).   Specialty: General Surgery Contact information: [address] [place] [address] [phone]          Allergies  Allergen Reactions  . Benzodiazepines Other (See Comments)    Cloudy thinking, memory loss, withdrawal symptoms when trying to stop without any  other medication for detox.   . Codeine Nausea Only  . Escitalopram Oxalate Nausea Only  . Nsaids Other (See Comments)    Bleeding ulcer   . Sertraline Hcl Nausea Only  . Statins Nausea And Vomiting  . Tramadol Hcl Other (See Comments)    Daughter doesn't want mother to take due to the fact that the medication made the daughter have a seizure     Consultations:  GI  General surgery   Procedures/Studies: CT Abdomen Pelvis W Contrast  Result Date: 04/03/2019 CLINICAL DATA:  74 year old female with history of abdominal pain for the past several months. Pain is worse during deep inspiration. EXAM: CT ABDOMEN AND PELVIS WITH CONTRAST TECHNIQUE: Multidetector CT imaging of the abdomen and pelvis was performed using the standard protocol following bolus administration of intravenous contrast. CONTRAST:  7mL OMNIPAQUE IOHEXOL 300 MG/ML  SOLN COMPARISON:  CT the abdomen and pelvis 03/01/2019. FINDINGS: Lower chest: Mild scarring in the lung bases bilaterally. Calcifications of the mitral annulus. Aortic atherosclerosis. Hepatobiliary:  Subcentimeter low-attenuation lesion in segment 7 of the liver, too small to characterize, but statistically likely to represent a tiny cyst. No other suspicious hepatic lesions. Status post cholecystectomy. Minimal intrahepatic biliary ductal dilatation which appear stable compared to prior examinations. Common bile duct is slightly prominent measuring 8 mm in the porta hepatis, which is within normal limits for this post cholecystectomy patient. Pancreas: No definite pancreatic mass or peripancreatic fluid collections or inflammatory changes. Spleen: Unremarkable. Adrenals/Urinary Tract: Bilateral kidneys and adrenal glands are normal in appearance. No hydroureteronephrosis. Urinary bladder is normal in appearance. Stomach/Bowel: Status post gastrojejunostomy. No pathologic dilatation of small bowel or colon. The appendix is not confidently identified and may be  surgically absent. Regardless, there are no inflammatory changes noted adjacent to the cecum to suggest the presence of an acute appendicitis at this time. Vascular/Lymphatic: Aortic atherosclerosis, without evidence of aneurysm or dissection in the abdominal or pelvic vasculature. No lymphadenopathy noted in the abdomen or pelvis. Reproductive: Status post hysterectomy.  Ovaries are trophic. Other: No significant volume of ascites.  No pneumoperitoneum. Musculoskeletal: Status post PLIF at L4-L5 with interbody graft at L4-L5 interspace there are no aggressive appearing lytic or blastic lesions noted in the visualized portions of the skeleton. IMPRESSION: 1. No acute findings are noted in the abdomen or pelvis to account for the patient's symptoms. 2. Aortic atherosclerosis. 3. Postoperative changes and other chronic incidental findings, as above. Electronically Signed   By: Vinnie Langton M.D.   On: 04/03/2019 12:10     Discharge Exam: Vitals:   04/04/19 1953 04/05/19 0531  BP: (!) 155/86 131/61  Pulse: 83 72  Resp: 16 12  Temp: 98.4 F (36.9 C) 98.7 F (37.1 C)  SpO2: 99% 95%   Vitals:   04/04/19 1318 04/04/19 1941 04/04/19 1953 04/05/19 0531  BP: (!) 186/92  (!) 155/86 131/61  Pulse: 70  83 72  Resp: 17  16 12   Temp: 98.5 F (36.9 C)  98.4 F (36.9 C) 98.7 F (37.1 C)  TempSrc: Oral  Oral Oral  SpO2: 100% 99% 99% 95%  Weight:      Height:        General: Pt is alert, awake, not in acute distress Cardiovascular: RRR, S1/S2 +, no rubs, no gallops Respiratory: CTA bilaterally, no wheezing, no rhonchi Abdominal: Soft, NT, ND, bowel sounds + Extremities: no edema, no cyanosis    The results of significant diagnostics from this hospitalization (including imaging, microbiology, ancillary and laboratory) are listed below for reference.     Microbiology: Recent Results (from the past 240 hour(s))  SARS CORONAVIRUS 2 (TAT 6-24 HRS) Nasopharyngeal Nasopharyngeal Swab     Status:  None   Collection Time: 04/03/19  6:13 PM   Specimen: Nasopharyngeal Swab  Result Value Ref Range Status   SARS Coronavirus 2 NEGATIVE NEGATIVE Final    Comment: (NOTE) SARS-CoV-2 target nucleic acids are NOT DETECTED. The SARS-CoV-2 RNA is generally detectable in upper and lower respiratory specimens during the acute phase of infection. Negative results do not preclude SARS-CoV-2 infection, do not rule out co-infections with other pathogens, and should not be used as the sole basis for treatment or other patient management decisions. Negative results must be combined with clinical observations, patient history, and epidemiological information. The expected result is Negative. Fact Sheet for Patients: SugarRoll.be Fact Sheet for Healthcare Providers: https://www.woods-mathews.com/ This test is not yet approved or cleared by the [place] FDA and  has been authorized for detection and/or diagnosis of SARS-CoV-2  by FDA under an Emergency Use Authorization (EUA). This EUA will remain  in effect (meaning this test can be used) for the duration of the COVID-19 declaration under Section 56 4(b)(1) of the Act, 21 U.S.C. section 360bbb-3(b)(1), unless the authorization is terminated or revoked sooner. Performed at [facility], [place] [address], [place], [place] [address]      Labs: BNP (last 3 results) No results for input(s): BNP in the last 8760 hours. Basic Metabolic Panel: Recent Labs  Lab 04/03/19 1053 04/05/19 0559  NA 140 141  K 3.9 4.0  CL 103 103  CO2 25 27  GLUCOSE 131* 110*  BUN 18 17  CREATININE 1.21* 1.40*  CALCIUM 9.7 9.3   Liver Function Tests: Recent Labs  Lab 04/03/19 1053  AST 17  ALT 12  ALKPHOS 68  BILITOT 0.4  PROT 7.2  ALBUMIN 4.0   Recent Labs  Lab 04/03/19 1053  LIPASE 26   No results for input(s): AMMONIA in the last 168 hours. CBC: Recent Labs  Lab 04/03/19 1053 04/04/19 0710  04/05/19 0559  WBC 8.5 5.7 5.0  NEUTROABS 5.9  --   --   HGB 9.6* 9.0* 8.8*  HCT 31.9* 31.1* 29.9*  MCV 85.8 88.1 87.2  PLT 319 296 277   Cardiac Enzymes: No results for input(s): CKTOTAL, CKMB, CKMBINDEX, TROPONINI in the last 168 hours. BNP: Invalid input(s): POCBNP CBG: No results for input(s): GLUCAP in the last 168 hours. D-Dimer No results for input(s): DDIMER in the last 72 hours. Hgb A1c No results for input(s): HGBA1C in the last 72 hours. Lipid Profile No results for input(s): CHOL, HDL, LDLCALC, TRIG, CHOLHDL, LDLDIRECT in the last 72 hours. Thyroid function studies No results for input(s): TSH, T4TOTAL, T3FREE, THYROIDAB in the last 72 hours.  Invalid input(s): FREET3 Anemia work up Recent Labs    04/04/19 0516 04/04/19 0710  VITAMINB12 1,022*  --   FERRITIN 15  --   TIBC 482*  --   IRON 29  --   RETICCTPCT  --  1.6   Urinalysis    Component Value Date/Time   COLORURINE STRAW (A) 04/03/2019 1037   APPEARANCEUR CLEAR 04/03/2019 1037   LABSPEC 1.016 04/03/2019 1037   PHURINE 8.0 04/03/2019 1037   GLUCOSEU NEGATIVE 04/03/2019 1037   HGBUR NEGATIVE 04/03/2019 1037   BILIRUBINUR NEGATIVE 04/03/2019 1037   KETONESUR NEGATIVE 04/03/2019 1037   PROTEINUR NEGATIVE 04/03/2019 1037   UROBILINOGEN 0.2 05/09/2012 1741   NITRITE NEGATIVE 04/03/2019 1037   LEUKOCYTESUR TRACE (A) 04/03/2019 1037   Sepsis Labs Invalid input(s): PROCALCITONIN,  WBC,  LACTICIDVEN Microbiology Recent Results (from the past 240 hour(s))  SARS CORONAVIRUS 2 (TAT 6-24 HRS) Nasopharyngeal Nasopharyngeal Swab     Status: None   Collection Time: 04/03/19  6:13 PM   Specimen: Nasopharyngeal Swab  Result Value Ref Range Status   SARS Coronavirus 2 NEGATIVE NEGATIVE Final    Comment: (NOTE) SARS-CoV-2 target nucleic acids are NOT DETECTED. The SARS-CoV-2 RNA is generally detectable in upper and lower respiratory specimens during the acute phase of infection. Negative results do not  preclude SARS-CoV-2 infection, do not rule out co-infections with other pathogens, and should not be used as the sole basis for treatment or other patient management decisions. Negative results must be combined with clinical observations, patient history, and epidemiological information. The expected result is Negative. Fact Sheet for Patients: SugarRoll.be Fact Sheet for Healthcare Providers: https://www.woods-mathews.com/ This test is not yet approved or cleared by  the [employer] and  has been authorized for detection and/or diagnosis of SARS-CoV-2 by FDA under an Emergency Use Authorization (EUA). This EUA will remain  in effect (meaning this test can be used) for the duration of the COVID-19 declaration under Section 56 4(b)(1) of the Act, 21 U.S.C. section 360bbb-3(b)(1), unless the authorization is terminated or revoked sooner. Performed at [facility], [place] [address], [place], [place] [address]      Time coordinating discharge: 35 minutes  SIGNED:   Rodena Goldmann, DO [facility] 04/05/2019, 8:58 AM  If 7PM-7AM, please contact night-coverage [email or web address]

## 2019-04-05 NOTE — Progress Notes (Signed)
Pt medicated for pain per request. Discharge instructions reviewed with pt. Reviewed Medications, prescriptions with pt. Pt wanting to wait until after lunch for discharge, wanting to hold off on calling her daughter at this time. States she is not wanting to be a home without her daughter there. Pt does confirm that another person lives in the home besides her daughter so she won't be alone, but that she is more comfortable with her daughter being there. Advised pt to call her daughter to figure out a ride home. Pt remains hesitant to do so at this time, states she will wait until after lunch. Primary RN notified.

## 2019-04-05 NOTE — Plan of Care (Signed)
  Problem: Education: Goal: Knowledge of General Education information will improve Description: Including pain rating scale, medication(s)/side effects and non-pharmacologic comfort measures Outcome: Progressing   Problem: Clinical Measurements: Goal: Will remain free from infection Outcome: Progressing   Problem: Clinical Measurements: Goal: Respiratory complications will improve Outcome: Progressing   Problem: Clinical Measurements: Goal: Cardiovascular complication will be avoided Outcome: Progressing   Problem: Elimination: Goal: Will not experience complications related to bowel motility Outcome: Progressing   Problem: Pain Managment: Goal: General experience of comfort will improve Outcome: Progressing   Problem: Safety: Goal: Ability to remain free from injury will improve Outcome: Progressing   Problem: Skin Integrity: Goal: Risk for impaired skin integrity will decrease Outcome: Progressing

## 2019-04-18 ENCOUNTER — Ambulatory Visit (HOSPITAL_COMMUNITY)
Admission: RE | Admit: 2019-04-18 | Discharge: 2019-04-18 | Disposition: A | Discharge status: Home / Self Care | Payer: Medicare Other | Source: Ambulatory Visit | Attending: Neurosurgery | Admitting: Neurosurgery

## 2019-04-18 ENCOUNTER — Other Ambulatory Visit: Payer: Self-pay

## 2019-04-18 DIAGNOSIS — G8929 Other chronic pain: Secondary | ICD-10-CM | POA: Diagnosis present

## 2019-04-18 DIAGNOSIS — M5441 Lumbago with sciatica, right side: Secondary | ICD-10-CM | POA: Diagnosis present

## 2019-04-18 DIAGNOSIS — M5442 Lumbago with sciatica, left side: Secondary | ICD-10-CM | POA: Diagnosis present
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
## 2019-05-09 ENCOUNTER — Other Ambulatory Visit: Payer: Self-pay

## 2019-05-09 ENCOUNTER — Telehealth (HOSPITAL_COMMUNITY): Payer: Medicare Other | Admitting: Psychiatry

## 2019-05-09 ENCOUNTER — Telehealth (HOSPITAL_COMMUNITY): Payer: Self-pay | Admitting: Psychiatry

## 2019-05-09 NOTE — Telephone Encounter (Signed)
Sent link for video visit through D[facility] me. Patient did not sign in. Called the patient  twice for appointment scheduled today. The patient did not answer the phone. Left voice message to contact the office.  

## 2019-05-17 ENCOUNTER — Ambulatory Visit (HOSPITAL_COMMUNITY): Payer: Medicare Other

## 2019-05-24 ENCOUNTER — Other Ambulatory Visit: Payer: Self-pay | Admitting: Neurosurgery

## 2019-05-28 ENCOUNTER — Telehealth: Payer: Self-pay | Admitting: Internal Medicine

## 2019-05-28 ENCOUNTER — Telehealth: Payer: Self-pay

## 2019-05-28 MED ORDER — ONDANSETRON HCL 4 MG PO TABS: 4.0000 mg | ORAL_TABLET | Freq: Three times a day (TID) | ORAL | 1 refills | Status: DC | PRN

## 2019-05-28 NOTE — Telephone Encounter (Signed)
[phone]  Please call patient, she thinks she has another infections and wants to know if we can cell in an antibiotic

## 2019-05-28 NOTE — Telephone Encounter (Signed)
See other phone note

## 2019-05-28 NOTE — Addendum Note (Signed)
Addended by: Mahala Menghini on: 05/28/2019 03:51 PM   Modules accepted: Orders
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
## 2019-05-28 NOTE — Telephone Encounter (Signed)
Reviewed her records. Recent EGD with anastomotic ulcer. She needs to be on pantoprazole BID if she is not. I can call in something for nausea but not for antibiotics. Patient would need to be seen as it is not clear what type of infection she is concerned about having. She has history of chronic abdominal pain and in part could be due from her hernia. She was seen by Dr. Constance Haw while inpatient recently. She should follow up with Dr. Constance Haw for that.   If we can see her sooner, let's try and do that but she would need face to face encounter.

## 2019-05-29 NOTE — Telephone Encounter (Signed)
Spoke with pt and her daughter. They are going to try and make it until pts apt 5/18. Pt is taking Pantoprazole 40 mg bid and is going to continue it. Pt would like for some nausea medication to be sent to her pharmacy. Pt is aware that she should follow Dr. Constance Haw about the pain she experiences.

## 2019-05-29 NOTE — Telephone Encounter (Signed)
Noted  

## 2019-05-29 NOTE — Telephone Encounter (Signed)
Noted. zofran was sent in yesterday.

## 2019-05-30 ENCOUNTER — Telehealth: Payer: Self-pay | Admitting: Internal Medicine

## 2019-05-30 NOTE — Telephone Encounter (Signed)
Lmom, waiting on a return call. Pt will most likely have to change her pharmacy and use a goodrx card to help with the cost.

## 2019-05-30 NOTE — Telephone Encounter (Signed)
Pt said the prescription for nausea that was called into [facility] on [address] her insurance doesn't cover. She is asking for something else for nausea and also something for pain. [phone]

## 2019-06-01 NOTE — Telephone Encounter (Signed)
Tried calling about pts medication. Pts daughter said she wasn't the daughter that called. Waiting on a return call.

## 2019-06-05 ENCOUNTER — Encounter: Payer: Self-pay | Admitting: Gastroenterology

## 2019-06-05 ENCOUNTER — Ambulatory Visit: Payer: Medicare Other | Admitting: Gastroenterology

## 2019-06-09 ENCOUNTER — Encounter (HOSPITAL_COMMUNITY): Payer: Self-pay

## 2019-06-09 ENCOUNTER — Other Ambulatory Visit: Payer: Self-pay

## 2019-06-09 ENCOUNTER — Inpatient Hospital Stay (HOSPITAL_COMMUNITY)
Admission: EM | Admit: 2019-06-09 | Discharge: 2019-06-15 | DRG: 310 | Disposition: A | Discharge status: Home Health | Payer: PPO | Attending: Internal Medicine | Admitting: Internal Medicine

## 2019-06-09 ENCOUNTER — Emergency Department (HOSPITAL_COMMUNITY): Payer: PPO

## 2019-06-09 DIAGNOSIS — I4891 Unspecified atrial fibrillation: Secondary | ICD-10-CM | POA: Diagnosis not present

## 2019-06-09 DIAGNOSIS — R000 Tachycardia, unspecified: Secondary | ICD-10-CM | POA: Diagnosis not present

## 2019-06-09 DIAGNOSIS — E785 Hyperlipidemia, unspecified: Secondary | ICD-10-CM | POA: Diagnosis present

## 2019-06-09 DIAGNOSIS — R4182 Altered mental status, unspecified: Secondary | ICD-10-CM | POA: Diagnosis not present

## 2019-06-09 DIAGNOSIS — R1084 Generalized abdominal pain: Secondary | ICD-10-CM

## 2019-06-09 DIAGNOSIS — R197 Diarrhea, unspecified: Secondary | ICD-10-CM | POA: Diagnosis not present

## 2019-06-09 DIAGNOSIS — Z8249 Family history of ischemic heart disease and other diseases of the circulatory system: Secondary | ICD-10-CM

## 2019-06-09 DIAGNOSIS — R209 Unspecified disturbances of skin sensation: Secondary | ICD-10-CM | POA: Diagnosis not present

## 2019-06-09 DIAGNOSIS — I361 Nonrheumatic tricuspid (valve) insufficiency: Secondary | ICD-10-CM | POA: Diagnosis not present

## 2019-06-09 DIAGNOSIS — R112 Nausea with vomiting, unspecified: Secondary | ICD-10-CM

## 2019-06-09 DIAGNOSIS — Z888 Allergy status to other drugs, medicaments and biological substances status: Secondary | ICD-10-CM | POA: Diagnosis not present

## 2019-06-09 DIAGNOSIS — Z9884 Bariatric surgery status: Secondary | ICD-10-CM | POA: Diagnosis not present

## 2019-06-09 DIAGNOSIS — M549 Dorsalgia, unspecified: Secondary | ICD-10-CM | POA: Diagnosis not present

## 2019-06-09 DIAGNOSIS — R0600 Dyspnea, unspecified: Secondary | ICD-10-CM | POA: Diagnosis not present

## 2019-06-09 DIAGNOSIS — M5489 Other dorsalgia: Secondary | ICD-10-CM | POA: Diagnosis not present

## 2019-06-09 DIAGNOSIS — R1013 Epigastric pain: Secondary | ICD-10-CM | POA: Diagnosis not present

## 2019-06-09 DIAGNOSIS — G5603 Carpal tunnel syndrome, bilateral upper limbs: Secondary | ICD-10-CM | POA: Diagnosis present

## 2019-06-09 DIAGNOSIS — Z886 Allergy status to analgesic agent status: Secondary | ICD-10-CM | POA: Diagnosis not present

## 2019-06-09 DIAGNOSIS — G8929 Other chronic pain: Secondary | ICD-10-CM | POA: Diagnosis not present

## 2019-06-09 DIAGNOSIS — Z20822 Contact with and (suspected) exposure to covid-19: Secondary | ICD-10-CM | POA: Diagnosis not present

## 2019-06-09 DIAGNOSIS — R1111 Vomiting without nausea: Secondary | ICD-10-CM | POA: Diagnosis not present

## 2019-06-09 DIAGNOSIS — I129 Hypertensive chronic kidney disease with stage 1 through stage 4 chronic kidney disease, or unspecified chronic kidney disease: Secondary | ICD-10-CM | POA: Diagnosis not present

## 2019-06-09 DIAGNOSIS — E7800 Pure hypercholesterolemia, unspecified: Secondary | ICD-10-CM | POA: Diagnosis present

## 2019-06-09 DIAGNOSIS — R109 Unspecified abdominal pain: Secondary | ICD-10-CM | POA: Diagnosis not present

## 2019-06-09 DIAGNOSIS — R079 Chest pain, unspecified: Secondary | ICD-10-CM | POA: Diagnosis not present

## 2019-06-09 DIAGNOSIS — F419 Anxiety disorder, unspecified: Secondary | ICD-10-CM | POA: Diagnosis present

## 2019-06-09 DIAGNOSIS — Z96659 Presence of unspecified artificial knee joint: Secondary | ICD-10-CM | POA: Diagnosis present

## 2019-06-09 DIAGNOSIS — R1314 Dysphagia, pharyngoesophageal phase: Secondary | ICD-10-CM | POA: Diagnosis present

## 2019-06-09 DIAGNOSIS — Z79899 Other long term (current) drug therapy: Secondary | ICD-10-CM

## 2019-06-09 DIAGNOSIS — I10 Essential (primary) hypertension: Secondary | ICD-10-CM | POA: Diagnosis not present

## 2019-06-09 DIAGNOSIS — Z885 Allergy status to narcotic agent status: Secondary | ICD-10-CM

## 2019-06-09 DIAGNOSIS — I4892 Unspecified atrial flutter: Secondary | ICD-10-CM | POA: Diagnosis not present

## 2019-06-09 DIAGNOSIS — K219 Gastro-esophageal reflux disease without esophagitis: Secondary | ICD-10-CM | POA: Diagnosis present

## 2019-06-09 DIAGNOSIS — N1832 Chronic kidney disease, stage 3b: Secondary | ICD-10-CM | POA: Diagnosis not present

## 2019-06-09 DIAGNOSIS — I4819 Other persistent atrial fibrillation: Principal | ICD-10-CM

## 2019-06-09 DIAGNOSIS — R531 Weakness: Secondary | ICD-10-CM | POA: Diagnosis not present

## 2019-06-09 DIAGNOSIS — Z87891 Personal history of nicotine dependence: Secondary | ICD-10-CM

## 2019-06-09 DIAGNOSIS — M542 Cervicalgia: Secondary | ICD-10-CM | POA: Diagnosis not present

## 2019-06-09 DIAGNOSIS — I340 Nonrheumatic mitral (valve) insufficiency: Secondary | ICD-10-CM | POA: Diagnosis not present

## 2019-06-09 DIAGNOSIS — D649 Anemia, unspecified: Secondary | ICD-10-CM | POA: Diagnosis present

## 2019-06-09 DIAGNOSIS — R0689 Other abnormalities of breathing: Secondary | ICD-10-CM | POA: Diagnosis not present

## 2019-06-09 DIAGNOSIS — R739 Hyperglycemia, unspecified: Secondary | ICD-10-CM | POA: Diagnosis not present

## 2019-06-09 DIAGNOSIS — R208 Other disturbances of skin sensation: Secondary | ICD-10-CM | POA: Diagnosis not present

## 2019-06-09 DIAGNOSIS — Z6836 Body mass index (BMI) 36.0-36.9, adult: Secondary | ICD-10-CM | POA: Diagnosis not present

## 2019-06-09 DIAGNOSIS — Z8711 Personal history of peptic ulcer disease: Secondary | ICD-10-CM

## 2019-06-09 DIAGNOSIS — K439 Ventral hernia without obstruction or gangrene: Secondary | ICD-10-CM | POA: Diagnosis present

## 2019-06-09 DIAGNOSIS — F429 Obsessive-compulsive disorder, unspecified: Secondary | ICD-10-CM | POA: Diagnosis present

## 2019-06-09 DIAGNOSIS — R1310 Dysphagia, unspecified: Secondary | ICD-10-CM | POA: Diagnosis not present

## 2019-06-09 DIAGNOSIS — Z79890 Hormone replacement therapy (postmenopausal): Secondary | ICD-10-CM

## 2019-06-09 DIAGNOSIS — K449 Diaphragmatic hernia without obstruction or gangrene: Secondary | ICD-10-CM | POA: Diagnosis present

## 2019-06-09 DIAGNOSIS — Z743 Need for continuous supervision: Secondary | ICD-10-CM | POA: Diagnosis not present

## 2019-06-09 DIAGNOSIS — E039 Hypothyroidism, unspecified: Secondary | ICD-10-CM | POA: Diagnosis present

## 2019-06-09 DIAGNOSIS — K5909 Other constipation: Secondary | ICD-10-CM | POA: Diagnosis not present

## 2019-06-09 DIAGNOSIS — M545 Low back pain: Secondary | ICD-10-CM | POA: Diagnosis not present

## 2019-06-09 DIAGNOSIS — R7302 Impaired glucose tolerance (oral): Secondary | ICD-10-CM | POA: Diagnosis not present

## 2019-06-09 DIAGNOSIS — K3189 Other diseases of stomach and duodenum: Secondary | ICD-10-CM | POA: Diagnosis present

## 2019-06-09 DIAGNOSIS — Z981 Arthrodesis status: Secondary | ICD-10-CM

## 2019-06-09 DIAGNOSIS — Z818 Family history of other mental and behavioral disorders: Secondary | ICD-10-CM

## 2019-06-09 HISTORY — DX: Other chronic pain: G89.29

## 2019-06-09 HISTORY — DX: Other chronic pain: M54.9

## 2019-06-09 HISTORY — DX: Calculus of kidney: N20.0

## 2019-06-09 HISTORY — DX: Personal history of other medical treatment: Z92.89

## 2019-06-09 HISTORY — DX: Personal history of other diseases of the digestive system: Z87.19

## 2019-06-09 LAB — COMPREHENSIVE METABOLIC PANEL
ALT: 9 U/L (ref 0–44)
AST: 16 U/L (ref 15–41)
Albumin: 3.8 g/dL (ref 3.5–5.0)
Alkaline Phosphatase: 61 U/L (ref 38–126)
Anion gap: 9 (ref 5–15)
BUN: 20 mg/dL (ref 8–23)
CO2: 24 mmol/L (ref 22–32)
Calcium: 8.9 mg/dL (ref 8.9–10.3)
Chloride: 107 mmol/L (ref 98–111)
Creatinine, Ser: 1.25 mg/dL — ABNORMAL HIGH (ref 0.44–1.00)
GFR calc Af Amer: 50 mL/min — ABNORMAL LOW (ref >60)
GFR calc non Af Amer: 43 mL/min — ABNORMAL LOW (ref >60)
Glucose, Bld: 138 mg/dL — ABNORMAL HIGH (ref 70–99)
Potassium: 3.5 mmol/L (ref 3.5–5.1)
Sodium: 140 mmol/L (ref 135–145)
Total Bilirubin: 0.7 mg/dL (ref 0.3–1.2)
Total Protein: 6.5 g/dL (ref 6.5–8.1)

## 2019-06-09 LAB — URINALYSIS, COMPLETE (UACMP) WITH MICROSCOPIC
Bilirubin Urine: NEGATIVE
Glucose, UA: NEGATIVE mg/dL
Hgb urine dipstick: NEGATIVE
Ketones, ur: NEGATIVE mg/dL
Nitrite: NEGATIVE
Protein, ur: NEGATIVE mg/dL
Specific Gravity, Urine: 1.033 — ABNORMAL HIGH (ref 1.005–1.030)
pH: 6 (ref 5.0–8.0)

## 2019-06-09 LAB — CBC WITH DIFFERENTIAL/PLATELET
Abs Immature Granulocytes: 0.02 10*3/uL (ref 0.00–0.07)
Basophils Absolute: 0 10*3/uL (ref 0.0–0.1)
Basophils Relative: 0 %
Eosinophils Absolute: 0.1 10*3/uL (ref 0.0–0.5)
Eosinophils Relative: 1 %
HCT: 31.2 % — ABNORMAL LOW (ref 36.0–46.0)
Hemoglobin: 9.1 g/dL — ABNORMAL LOW (ref 12.0–15.0)
Immature Granulocytes: 0 %
Lymphocytes Relative: 18 %
Lymphs Abs: 1.3 10*3/uL (ref 0.7–4.0)
MCH: 23 pg — ABNORMAL LOW (ref 26.0–34.0)
MCHC: 29.2 g/dL — ABNORMAL LOW (ref 30.0–36.0)
MCV: 79 fL — ABNORMAL LOW (ref 80.0–100.0)
Monocytes Absolute: 0.5 10*3/uL (ref 0.1–1.0)
Monocytes Relative: 7 %
Neutro Abs: 5.4 10*3/uL (ref 1.7–7.7)
Neutrophils Relative %: 74 %
Platelets: 292 10*3/uL (ref 150–400)
RBC: 3.95 MIL/uL (ref 3.87–5.11)
RDW: 17.2 % — ABNORMAL HIGH (ref 11.5–15.5)
WBC: 7.4 10*3/uL (ref 4.0–10.5)
nRBC: 0 % (ref 0.0–0.2)

## 2019-06-09 LAB — HEMOGLOBIN A1C
Hgb A1c MFr Bld: 6.2 % — ABNORMAL HIGH (ref 4.8–5.6)
Mean Plasma Glucose: 131.24 mg/dL

## 2019-06-09 LAB — RAPID URINE DRUG SCREEN, HOSP PERFORMED
Amphetamines: NOT DETECTED
Barbiturates: NOT DETECTED
Benzodiazepines: NOT DETECTED
Cocaine: NOT DETECTED
Opiates: POSITIVE — AB
Tetrahydrocannabinol: NOT DETECTED

## 2019-06-09 LAB — TSH: TSH: 4.005 u[IU]/mL (ref 0.350–4.500)

## 2019-06-09 LAB — LIPASE, BLOOD: Lipase: 25 U/L (ref 11–51)

## 2019-06-09 LAB — MRSA PCR SCREENING: MRSA by PCR: NEGATIVE

## 2019-06-09 LAB — SARS CORONAVIRUS 2 BY RT PCR (HOSPITAL ORDER, PERFORMED IN ~~LOC~~ HOSPITAL LAB): SARS Coronavirus 2: NEGATIVE

## 2019-06-09 MED ORDER — FENTANYL CITRATE (PF) 100 MCG/2ML IJ SOLN
50.0000 ug | Freq: Once | INTRAMUSCULAR | Status: AC
  Administered 2019-06-09: 50 ug via INTRAVENOUS
  Filled 2019-06-09: qty 2

## 2019-06-09 MED ORDER — IOHEXOL 300 MG/ML  SOLN
80.0000 mL | Freq: Once | INTRAMUSCULAR | Status: AC | PRN
  Administered 2019-06-09: 80 mL via INTRAVENOUS

## 2019-06-09 MED ORDER — DILTIAZEM LOAD VIA INFUSION
10.0000 mg | Freq: Once | INTRAVENOUS | Status: AC
  Administered 2019-06-09: 10 mg via INTRAVENOUS
  Filled 2019-06-09: qty 10

## 2019-06-09 MED ORDER — ONDANSETRON HCL 4 MG/2ML IJ SOLN
4.0000 mg | Freq: Four times a day (QID) | INTRAMUSCULAR | Status: DC | PRN
  Administered 2019-06-09 – 2019-06-14 (×6): 4 mg via INTRAVENOUS
  Filled 2019-06-09 (×6): qty 2

## 2019-06-09 MED ORDER — MELATONIN 3 MG PO TABS
3.0000 mg | ORAL_TABLET | Freq: Every evening | ORAL | Status: DC | PRN
  Administered 2019-06-09 – 2019-06-15 (×6): 3 mg via ORAL
  Filled 2019-06-09 (×6): qty 1

## 2019-06-09 MED ORDER — ONDANSETRON HCL 4 MG PO TABS
4.0000 mg | ORAL_TABLET | Freq: Four times a day (QID) | ORAL | Status: DC | PRN
  Administered 2019-06-10 – 2019-06-15 (×4): 4 mg via ORAL
  Filled 2019-06-09 (×4): qty 1

## 2019-06-09 MED ORDER — CHLORHEXIDINE GLUCONATE CLOTH 2 % EX PADS
6.0000 | MEDICATED_PAD | Freq: Every day | CUTANEOUS | Status: DC
  Administered 2019-06-10 – 2019-06-13 (×4): 6 via TOPICAL

## 2019-06-09 MED ORDER — PANTOPRAZOLE SODIUM 40 MG PO TBEC
40.0000 mg | DELAYED_RELEASE_TABLET | Freq: Two times a day (BID) | ORAL | Status: DC
  Administered 2019-06-09 – 2019-06-15 (×11): 40 mg via ORAL
  Filled 2019-06-09 (×12): qty 1

## 2019-06-09 MED ORDER — HEPARIN (PORCINE) 25000 UT/250ML-% IV SOLN
950.0000 [IU]/h | INTRAVENOUS | Status: AC
  Administered 2019-06-09: 1000 [IU]/h via INTRAVENOUS
  Filled 2019-06-09: qty 250

## 2019-06-09 MED ORDER — VENLAFAXINE HCL 37.5 MG PO TABS
75.0000 mg | ORAL_TABLET | Freq: Two times a day (BID) | ORAL | Status: DC
  Administered 2019-06-09 – 2019-06-15 (×12): 75 mg via ORAL
  Filled 2019-06-09 (×12): qty 2

## 2019-06-09 MED ORDER — MELATONIN 5 MG PO TABS
5.0000 mg | ORAL_TABLET | Freq: Once | ORAL | Status: DC
  Filled 2019-06-09: qty 1

## 2019-06-09 MED ORDER — DILTIAZEM HCL-DEXTROSE 125-5 MG/125ML-% IV SOLN (PREMIX)
5.0000 mg/h | INTRAVENOUS | Status: DC
  Administered 2019-06-09: 12.5 mg/h via INTRAVENOUS
  Administered 2019-06-09: 5 mg/h via INTRAVENOUS
  Filled 2019-06-09 (×2): qty 125

## 2019-06-09 MED ORDER — ACETAMINOPHEN 650 MG RE SUPP: 650.0000 mg | Freq: Four times a day (QID) | RECTAL | Status: DC | PRN

## 2019-06-09 MED ORDER — HEPARIN BOLUS VIA INFUSION
4000.0000 [IU] | Freq: Once | INTRAVENOUS | Status: AC
  Administered 2019-06-09: 4000 [IU] via INTRAVENOUS
  Filled 2019-06-09: qty 4000

## 2019-06-09 MED ORDER — LEVOTHYROXINE SODIUM 75 MCG PO TABS
75.0000 ug | ORAL_TABLET | Freq: Every day | ORAL | Status: DC
  Administered 2019-06-10 – 2019-06-15 (×6): 75 ug via ORAL
  Filled 2019-06-09 (×6): qty 1

## 2019-06-09 MED ORDER — ACETAMINOPHEN 325 MG PO TABS
650.0000 mg | ORAL_TABLET | Freq: Four times a day (QID) | ORAL | Status: DC | PRN
  Administered 2019-06-09 – 2019-06-10 (×2): 650 mg via ORAL
  Filled 2019-06-09 (×4): qty 2

## 2019-06-09 MED ORDER — SODIUM CHLORIDE 0.9 % IV BOLUS
500.0000 mL | Freq: Once | INTRAVENOUS | Status: AC
  Administered 2019-06-09: 500 mL via INTRAVENOUS

## 2019-06-09 NOTE — Progress Notes (Signed)
ANTICOAGULATION CONSULT NOTE - Initial Consult  Pharmacy Consult for heparin dosing Indication: atrial fibrillation  Allergies  Allergen Reactions  . Benzodiazepines Other (See Comments)    Cloudy thinking, memory loss, withdrawal symptoms when trying to stop without any other medication for detox.   . Codeine Nausea Only  . Escitalopram Oxalate Nausea Only  . Nsaids Other (See Comments)    Bleeding ulcer   . Sertraline Hcl Nausea Only  . Statins Nausea And Vomiting  . Tramadol Hcl Other (See Comments)    Daughter doesn't want mother to take due to the fact that the medication made the daughter have a seizure     Patient Measurements: Height: 5\' 5"  (165.1 cm) Weight: 99.8 kg (220 lb) IBW/kg (Calculated) : 57 Heparin Dosing Weight: HEPARIN DW (KG): 79.8  Vital Signs: Temp: 98.1 F (36.7 C) (05/22 1058) Temp Source: Oral (05/22 1058) BP: 141/93 (05/22 1500) Pulse Rate: 110 (05/22 1500)  Labs: Recent Labs    06/09/19 1115  HGB 9.1*  HCT 31.2*  PLT 292  CREATININE 1.25*    Estimated Creatinine Clearance: 47.6 mL/min (A) (by C-G formula based on SCr of 1.25 mg/dL (H)).   Medical History: Past Medical History:  Diagnosis Date  . Anemia   . Anxiety   . Arthritis   . Blood transfusion    /w bleeding ulcer & post knee replacement- 2012  . Chronic kidney disease    h/o renal calculi, h/o lithotripsy  . Copper deficiency 06/17/2015  . Depression   . GERD (gastroesophageal reflux disease)    h/o bleeding ulcer, hosp., [facility]  . Headache(784.0)   . Hiatal hernia   . History of kidney stones   . Hypothyroidism   . Morbid obesity ([place])   . Neuralgia    spondylosis & stenosis  . Obsessive-compulsive disorder   . PONV (postoperative nausea and vomiting)   . Recurrent upper respiratory infection (URI)   . Shortness of breath    with exertion  . Sleep apnea    study- [facility], CPAP not used in 3 months, states she has a new machine  but hasn't used    . Thyroid disease     Assessment: Pharmacy consulted to dose heparin infusion for this 74yo female with atrial fibrillation and RVR.   Baseline Hb is below normal at 9.9mg /dL.  CXR  is negative for acute  cardiopulmonary disease. Patient wasn't taking any anti-coagulant medications prior to admission.   Goal of Therapy:  Heparin level 0.3-0.7 units/ml Monitor platelets by anticoagulation protocol: Yes   Plan:  Give 4000 units bolus x 1 Start heparin infusion at 1000 units/hr Check anti-Xa level in 6-8 hours and daily while on heparin Continue to monitor H&H and platelets  Despina Pole 06/09/2019,3:35 PM

## 2019-06-09 NOTE — ED Triage Notes (Signed)
Pt reports generalized abd pain but worse on r side since 3 am.  Reports nausea, no vomiting or diarrhea.  LBM was yesterday.  EMS says pt was afib rate 145-180 and bp 120/60 when they arrived.  Reports approx 16min pta, pt's bp decreased to 60 systolic and HR 123XX123.  Ems gave 7mcg fentanyl pta and gave 254ml bolus NS.   Pt alert and oriented.

## 2019-06-09 NOTE — Progress Notes (Signed)
Paged [facility] in regard to Pain and need for sleep aid. Fentanyl one time order and melatonin one time order prescribed. To be given. Continue to monitor.

## 2019-06-09 NOTE — ED Provider Notes (Signed)
[facility] EMERGENCY DEPARTMENT Provider Note   CSN: [id number] Arrival date & time: 06/09/19  1041     History Chief Complaint  Patient presents with  . Abdominal Pain    [name] is a 74 y.o. female with a history of anemia, hypothyroidism, hypercholesterolemia, GERD, and prior abdominal surgeries including cholecystectomy, appendectomy, hysterectomy, and gastric bypass who presents to the ED via EMS with complaints of abdominal pain that began at 3 AM this morning.  Patient states she woke up from sleep with discomfort to the back and abdomen, states this is fairly generalized but is worse on the right side.  Pain seems to start in the back and radiate around to the front.  Discomfort is intermittent without specific alleviating or aggravating factors.  Associated symptoms include nausea and urinary frequency.  Her daughter called 911, upon EMS arrival patient was noted to be in A. fib with RVR, had a decrease in BP temporarily en route, given 50 mcg of fentanyl and 200 cc of fluids.  Patient states that she has had intermittent palpitations and dyspnea for a while now, she is unsure exactly when it started, has not been evaluated for this previously.  She denies history of atrial fibrillation.  She denies fever, emesis, dysuria, hematuria, melena, hematochezia, diarrhea, constipation, chest pain, or syncope.  HPI     Past Medical History:  Diagnosis Date  . Anemia   . Anxiety   . Arthritis   . Blood transfusion    /w bleeding ulcer & post knee replacement- 2012  . Chronic kidney disease    h/o renal calculi, h/o lithotripsy  . Copper deficiency 06/17/2015  . Depression   . GERD (gastroesophageal reflux disease)    h/o bleeding ulcer, hosp., Rosana Berger  . Headache(784.0)   . Hiatal hernia   . History of kidney stones   . Hypothyroidism   . Morbid obesity ([place])   . Neuralgia    spondylosis & stenosis  . Obsessive-compulsive disorder   . PONV (postoperative nausea  and vomiting)   . Recurrent upper respiratory infection (URI)   . Shortness of breath    with exertion  . Sleep apnea    [facility]- [facility], CPAP not used in 3 months, states she has a new machine  but hasn't used   . Thyroid disease     Patient Active Problem List   Diagnosis Date Noted  . Anastomotic ulcer   . Acute anemia 04/04/2019  . Anemia   . Excessive use of nonsteroidal anti-inflammatory drug (NSAID)   . Nausea with vomiting 03/01/2019  . Diarrhea 03/01/2019  . Abdominal pain, epigastric 06/08/2017  . LLQ pain 06/08/2017  . Odynophagia 06/08/2017  . Dysphagia 06/08/2017  . Copper deficiency 06/17/2015  . Gastric ulceration   . Hiatal hernia   . Anemia, iron deficiency   . Spondylolisthesis of lumbar region 01/01/2013  . Palpitations 05/08/2012  . Insomnia due to mental disorder 12/03/2011  . Benzodiazepine causing adverse effect in therapeutic use 12/03/2011  . right upper urethral calculus with obstructionh 04/08/2010  . HYPOTHYROIDISM 03/26/2010  . Anxiety state, unspecified 03/26/2010  . H/O gastric bypass 03/26/2010  . Hydronephrosis, right 01/27/2010  . HEMOCCULT POSITIVE STOOL 08/11/2009  . Abdominal pain 05/20/2009  . BENIGN NEOPLASM OF PARATHYROID GLAND 03/19/2009  . B12 deficiency 03/19/2009  . HYPERCHOLESTEROLEMIA 03/19/2009  . OBESITY, MORBID 03/19/2009  . DEPRESSION/ANXIETY 03/19/2009  . Essential hypertension 03/19/2009  . GASTROESOPHAGEAL REFLUX DISEASE, CHRONIC 03/19/2009  . DEGENERATIVE  JOINT DISEASE 03/19/2009    Past Surgical History:  Procedure Laterality Date  . ABDOMINAL HYSTERECTOMY    . ANTERIOR CERVICAL DECOMP/DISCECTOMY FUSION  12/09/2010   Procedure: ANTERIOR CERVICAL DECOMPRESSION/DISCECTOMY FUSION 3 LEVELS;  Surgeon: Ophelia Charter;  Location: [place] NEURO ORS;  Service: Neurosurgery;  Laterality: N/A;  Cervical three-four,Cervical four-five,Cervical Five-Six,Cervical Six-Seven ANTERIOR CERVICAL DECOMPRESSION WITH FUSION  INTERBODY PROTHESIS PLATING AND BONEGRAFT  . APPENDECTOMY    . BIOPSY N/A 11/14/2014   Procedure: GASTRIC BIOPSY;  Surgeon: Daneil Dolin, MD;  Location: [facility] OR[facility];  Service: Endoscopy;  Laterality: N/A;  . CESAREAN SECTION    . CHOLECYSTECTOMY    . COLONOSCOPY WITH PROPOFOL N/A 11/14/2014   RMR: redundant colon but otherwise normal  . ESOPHAGOGASTRODUODENOSCOPY (EGD) WITH PROPOFOL N/A 11/14/2014   RMR"s/p gastric surgery/anastomtic ulcer  . ESOPHAGOGASTRODUODENOSCOPY (EGD) WITH PROPOFOL N/A 09/15/2017   Procedure: ESOPHAGOGASTRODUODENOSCOPY (EGD) WITH PROPOFOL;  Surgeon: Daneil Dolin, MD;  Location: [facility] ENDO SUITE;  Service: Endoscopy;  Laterality: N/A;  11:30am  . ESOPHAGOGASTRODUODENOSCOPY (EGD) WITH PROPOFOL N/A 04/04/2019   Procedure: ESOPHAGOGASTRODUODENOSCOPY (EGD) WITH PROPOFOL;  Surgeon: Daneil Dolin, MD;  Location: [facility] SUITE;  Service: Endoscopy;  Laterality: N/A;  . EYE SURGERY     bilateral cataracts removed, w/IOL  . GASTRIC BYPASS  2007  . JOINT REPLACEMENT     07/2010 &  2002- respectively- both knees   . MALONEY DILATION N/A 09/15/2017   Procedure: Venia Minks DILATION;  Surgeon: Daneil Dolin, MD;  Location: [facility];  Service: Endoscopy;  Laterality: N/A;  . PARATHYROIDECTOMY    . PERIPHERALLY INSERTED CENTRAL CATHETER INSERTION     PICC line for treatment of MRSA  . TONSILLECTOMY     as a child     OB History   No obstetric history on file.     Family History  Problem Relation Age of Onset  . Anxiety disorder Mother   . Anxiety disorder Sister   . Bipolar disorder Daughter   . OCD Daughter   . ADD / ADHD Daughter   . Heart attack Father   . Anesthesia problems Neg Hx   . Hypotension Neg Hx   . Malignant hyperthermia Neg Hx   . Pseudochol deficiency Neg Hx   . Dementia Neg Hx   . Alcohol abuse Neg Hx   . Drug abuse Neg Hx   . Depression Neg Hx   . Paranoid behavior Neg Hx   . Schizophrenia Neg Hx   . Seizures Neg Hx   . Sexual abuse Neg Hx     . Physical abuse Neg Hx   . Colon cancer Neg Hx     Social History   Tobacco Use  . Smoking status: Former Smoker    Packs/day: 1.00    Years: 30.00    Pack years: 30.00    Types: Cigarettes    Quit date: 01/18/1998    Years since quitting: 21.4  . Smokeless tobacco: Never Used  Substance Use Topics  . Alcohol use: No  . Drug use: No    Home Medications Prior to Admission medications   Medication Sig Start Date End Date Taking? Authorizing Provider  Cyanocobalamin (VITAMIN B-12 IJ) Inject 1 Applicatorful as directed every 30 (thirty) days.     [provider]  diclofenac Sodium (VOLTAREN) 1 % GEL Apply 2 g topically 4 (four) times daily.  02/19/19   [provider]  furosemide (LASIX) 40 MG tablet Take 40 mg by mouth as  needed.    [provider]  levothyroxine (SYNTHROID, LEVOTHROID) 75 MCG tablet Take 75 mcg by mouth daily before breakfast.    [provider]  ondansetron (ZOFRAN) 4 MG tablet Take 1 tablet (4 mg total) by mouth every 8 (eight) hours as needed for nausea or vomiting. 05/28/19   Mahala Menghini, PA-C  oxyCODONE-acetaminophen (PERCOCET) 5-325 MG tablet Take 1-2 tablets by mouth every 8 (eight) hours as needed for severe pain. 04/05/19   Manuella Ghazi, Pratik D, DO  pantoprazole (PROTONIX) 40 MG tablet Take 1 tablet (40 mg total) by mouth 2 (two) times daily before a meal. 04/05/19 06/04/19  Manuella Ghazi, Pratik D, DO  polyethylene glycol (MIRALAX / GLYCOLAX) 17 g packet Take 17 g by mouth daily as needed for mild constipation. 04/05/19   Manuella Ghazi, Pratik D, DO  sucralfate (CARAFATE) 1 GM/10ML suspension Take 10 mLs (1 g total) by mouth 4 (four) times daily -  with meals and at bedtime for 10 days. 04/05/19 04/15/19  Manuella Ghazi, Pratik D, DO  traMADol (ULTRAM) 50 MG tablet SMARTSIG:1 Tablet(s) By Mouth 1 to 2 Times Daily 03/21/19   [provider]  venlafaxine (EFFEXOR) 75 MG tablet Take 75 mg by mouth 2 (two) times daily.     [provider]  Vitamin  D, Ergocalciferol, (DRISDOL) 50000 units CAPS capsule Take 50,000 Units by mouth every 7 (seven) days. Fridays    [provider]    Allergies    Benzodiazepines, Codeine, Escitalopram oxalate, Nsaids, Sertraline hcl, Statins, and Tramadol hcl  Review of Systems   Review of Systems  Constitutional: Negative for chills and fever.  Respiratory: Positive for shortness of breath.   Cardiovascular: Positive for palpitations. Negative for chest pain.  Gastrointestinal: Positive for abdominal pain and nausea. Negative for blood in stool, constipation, diarrhea and vomiting.  Genitourinary: Positive for frequency. Negative for dysuria, hematuria, vaginal bleeding and vaginal discharge.  Musculoskeletal: Positive for back pain.  Neurological: Negative for syncope.  All other systems reviewed and are negative.   Physical Exam Updated Vital Signs BP (!) 134/111   Pulse 66   Temp 98.1 F (36.7 C) (Oral)   Resp 20   Ht 5\' 5"  (1.651 m)   Wt 99.8 kg   SpO2 100%   BMI 36.61 kg/m   Physical Exam Vitals and nursing note reviewed.  Constitutional:      General: She is not in acute distress.    Appearance: She is well-developed. She is not toxic-appearing.  HENT:     Head: Normocephalic and atraumatic.  Eyes:     General:        Right eye: No discharge.        Left eye: No discharge.     Conjunctiva/sclera: Conjunctivae normal.  Cardiovascular:     Rate and Rhythm: Tachycardia present.     Comments: Irregularly irregular.  2+ symmetric radial pulses. Pulmonary:     Effort: Pulmonary effort is normal. No respiratory distress.     Breath sounds: Normal breath sounds. No wheezing, rhonchi or rales.  Abdominal:     General: There is no distension.     Palpations: Abdomen is soft.     Tenderness: There is generalized abdominal tenderness. There is right CVA tenderness. There is no left CVA tenderness.  Musculoskeletal:     Cervical back: Neck supple.     Comments: Diffuse mid  to lower back tenderness. No point/focal vertebral tenderness or palpable step off.   Skin:    General:  Skin is warm and dry.     Findings: No rash.  Neurological:     Mental Status: She is alert.     Comments: Clear speech.   Psychiatric:        Behavior: Behavior normal.     ED Results / Procedures / Treatments   Labs (all labs ordered are listed, but only abnormal results are displayed) Labs Reviewed  COMPREHENSIVE METABOLIC PANEL - Abnormal; Notable for the following components:      Result Value   Glucose, Bld 138 (*)    Creatinine, Ser 1.25 (*)    GFR calc non Af Amer 43 (*)    GFR calc Af Amer 50 (*)    All other components within normal limits  CBC WITH DIFFERENTIAL/PLATELET - Abnormal; Notable for the following components:   Hemoglobin 9.1 (*)    HCT 31.2 (*)    MCV 79.0 (*)    MCH 23.0 (*)    MCHC 29.2 (*)    RDW 17.2 (*)    All other components within normal limits  LIPASE, BLOOD  URINALYSIS, ROUTINE W REFLEX MICROSCOPIC    EKG None  Date: 06/09/2019  Rate: 145 bpm  Rhythm: atrial fibrillation with RVR.   QRS Axis: normal  Narrative Interpretation: atrial fibrillation with RVR.   Old EKG Reviewed: Afib new since last tracing.   Radiology CT Abdomen Pelvis W Contrast  Result Date: 06/09/2019 CLINICAL DATA:  Generalized abdominal pain for several hours EXAM: CT ABDOMEN AND PELVIS WITH CONTRAST TECHNIQUE: Multidetector CT imaging of the abdomen and pelvis was performed using the standard protocol following bolus administration of intravenous contrast. CONTRAST:  19mL OMNIPAQUE IOHEXOL 300 MG/ML  SOLN COMPARISON:  None. FINDINGS: Lower chest: No acute abnormality. Hepatobiliary: No focal liver abnormality is seen. Status post cholecystectomy. No biliary dilatation. Pancreas: Unremarkable. No pancreatic ductal dilatation or surrounding inflammatory changes. Spleen: Normal in size without focal abnormality. Adrenals/Urinary Tract: Adrenal glands are within normal  limits. Kidneys demonstrate a normal enhancement pattern. No obstructive changes are seen. No renal calculi are noted. The bladder is well distended. Stomach/Bowel: Appendix has been surgically removed. No obstructive or inflammatory changes of the colon or small bowel are seen. Postsurgical changes in the stomach are noted consistent with gastric bypass. Vascular/Lymphatic: Aortic atherosclerosis. No enlarged abdominal or pelvic lymph nodes. Reproductive: Status post hysterectomy. No adnexal masses. Other: No abdominal wall hernia or abnormality. No abdominopelvic ascites. Musculoskeletal: Degenerative changes of lumbar spine are noted. Postsurgical changes are seen at L4-5. IMPRESSION: Postsurgical changes without acute abnormality. Electronically Signed   By: Inez Catalina M.D.   On: 06/09/2019 14:05   DG Chest Port 1 View  Result Date: 06/09/2019 CLINICAL DATA:  74 year old female with a history of dyspnea EXAM: PORTABLE CHEST 1 VIEW COMPARISON:  04/20/2012 FINDINGS: The cardiac border appears slightly more prominent than the most recent comparison, potentially accentuated by apical lordotic positioning. Calcifications of the aortic arch. No pneumothorax or pleural effusion. No confluent airspace disease. Coarsened interstitial markings similar to the prior. No displaced fracture. Surgical changes of the cervical region. IMPRESSION: Negative for acute cardiopulmonary disease. Electronically Signed   By: Corrie Mckusick D.O.   On: 06/09/2019 12:04    Procedures .Critical Care Performed by: Amaryllis Dyke, PA-C Authorized by: Amaryllis Dyke, PA-C    CRITICAL CARE Performed by: Kennith Maes   Total critical care time: 35 minutes  Critical care time was exclusive of separately billable procedures and treating other patients.  Critical care  was necessary to treat or prevent imminent or life-threatening deterioration.  Critical care was time spent personally by me on the  following activities: development of treatment plan with patient and/or surrogate as well as nursing, discussions with consultants, evaluation of patient's response to treatment, examination of patient, obtaining history from patient or surrogate, ordering and performing treatments and interventions, ordering and review of laboratory studies, ordering and review of radiographic studies, pulse oximetry and re-evaluation of patient's condition.    (including critical care time)  2:23 PM Cardiac monitoring reveals afib, rate 108 as reviewed and interpreted by me. Cardiac monitoring was ordered due to dyspnea/palpitations and to monitor patient for dysrhythmia.  Medications Ordered in ED Medications  diltiazem (CARDIZEM) 1 mg/mL load via infusion 10 mg (10 mg Intravenous Bolus from Bag 06/09/19 1157)    And  diltiazem (CARDIZEM) 125 mg in dextrose 5% 125 mL (1 mg/mL) infusion (7.5 mg/hr Intravenous Rate/Dose Change 06/09/19 1224)  fentaNYL (SUBLIMAZE) injection 50 mcg (has no administration in time range)  fentaNYL (SUBLIMAZE) injection 50 mcg (50 mcg Intravenous Given 06/09/19 1127)  sodium chloride 0.9 % bolus 500 mL (0 mLs Intravenous Stopped 06/09/19 1311)  iohexol (OMNIPAQUE) 300 MG/ML solution 80 mL (80 mLs Intravenous Contrast Given 06/09/19 1326)    ED Course  I have reviewed the triage vital signs and the nursing notes.  Pertinent labs & imaging results that were available during my care of the patient were reviewed by me and considered in my medical decision making (see chart for details).    [name] was evaluated in Emergency Department on 06/09/2019 for the symptoms described in the history of present illness. He/she was evaluated in the context of the global COVID-19 pandemic, which necessitated consideration that the patient might be at risk for infection with the SARS-CoV-2 virus that causes COVID-19. Institutional protocols and algorithms that pertain to the evaluation of patients  at risk for COVID-19 are in a state of rapid change based on information released by regulatory bodies including the CDC and federal and state organizations. These policies and algorithms were followed during the patient's care in the ED.  MDM Rules/Calculators/A&P                     Patient presents to the ED with complaints of abdominal discomfort, noted to be in afib with RVR en route.   Additional history obtained:  Additional history obtained from EMS. Previous records obtained and reviewed.   ED Course:  11:15: Initial patient assessment.  Patient in A. fib with RVR, maintaining her blood pressure, vitals otherwise fairly unremarkable.  She has right CVA tenderness as well as generalized abdominal tenderness.  Will initiate Cardizem bolus and drip as onset of A. fib is not clear and patient is not hemodynamically unstable requiring cardioversion, she states sxs started this AM but has had intermittent sxs for awhile now.   We will also give analgesics and small fluid bolus.Plan for labs, chest x-ray, and CT abdomen/pelvis.   Lab Tests:  I Ordered, reviewed, and interpreted labs, which included:  CBC: Anemia that is similar to prior labs on record. CMP: Renal function similar to prior ranges. Lipase: Within normal limits Urinalysis: Pending  Imaging Studies ordered:  I ordered imaging studies which included CXR & CT A/P, I independently visualized and interpreted imaging which showed:   CXR: Negative for acute cardiopulmonary disease. CT A/P: Postsurgical changes without acute abnormality.   Reassuring CXR & CT imaging. Labs look @  baseline.  14:05:  HR 110 currently at a rate of 7.5 mg/hr  14:20: RE-EVAL: Patient reports she had improvement with fentanyl initially now having return of pain. Will re-dose.   Plan for admission for new onset afib w/ RVR.  CHA2DS2-VASc score: 3 UA Pending.  Discussed results & plan of care with patient who is in agreement.   14:37: CONSULT:  Discussed with hospitalist Dr. Carles Collet- accepts admission.   This is a shared visit with supervising physician Dr. Rogene Houston who has independently evaluated patient & is in agreement.   Portions of this note were generated with [employer]. Dictation errors may occur despite best attempts at proofreading.  Final Clinical Impression(s) / ED Diagnoses Final diagnoses:  Atrial fibrillation with rapid ventricular response ([place])  Generalized abdominal pain  Acute bilateral back pain, unspecified back location    Rx / DC Orders ED Discharge Orders    None       Amaryllis Dyke, PA-C 06/09/19 1438    Fredia Sorrow, MD 06/09/19 1611

## 2019-06-09 NOTE — H&P (Addendum)
History and Physical  [name] [id number] DOB: 1945/08/27 DOA: 06/09/2019   PCP: Lemmie Evens, MD   Patient coming from: Home  Chief Complaint: abdominal pain  HPI:  [name] is a 74 y.o. female with medical history of GERD, hyperlipidemia, hypothyroidism, gastric bypass, hypertension, peptic ulcer disease, anxiety presenting with abdominal pain that woke her up from sleep in early a.m. 06/09/2019.  She stated that it is a periumbilical and right-sided.  She states that she has had intermittent abdominal pain in the same area since her gastric bypass surgery in 2008.  She cannot clarify any alleviating or aggravating factors.  Nonetheless, patient had worsening of her abdominal pain waking her up from sleep as discussed above.  She has chronic "retching and dry heaving" which she states has been unchanged.  She denies any hematemesis.  She denies any fevers, chills, diarrhea, hematochezia melena, dysuria, hematuria.  The patient has been having some shortness of breath at least for the last 6 months, worse with exertion.  She occasionally feels her heart fluttering during the same period of time.  She states that she has some intermittent chest discomfort lasting 2 to 5 minutes.  She is unable to clarify any exacerbating or alleviating factors.  She states that this is been going on for this last 6 months. Notably, the patient was admitted to the hospital from 04/03/2019 to 04/05/2019 during which time she was worked up for hematemesis.  EGD at that time showed a large anastomotic ulcer with smaller satellite ulcers.  It was felt that this may been related to her NSAID use the patient was discharged home with PPI twice daily and Carafate. In the emergency department, patient was afebrile hemodynamically stable.  Initial heart rate was in the 140-150 range.  The patient was started on diltiazem drip.  EKG showed atrial fibrillation/flutter with nonspecific ST-T wave changes.  Chest x-ray  was negative for edema or consolidation.  CT of the abdomen pelvis showed postoperative changes without any acute findings.  The patient was started on diltiazem drip and admitted for further evaluation.  Assessment/Plan:  Atrial fibrillation with RVR, type unspecified -Continue diltiazem -Start IV heparin -Echocardiogram -TSH -CHADS-VASc = 4 (HTN, age, ASVD, femal)  Nausea and vomiting -Patient states that this has been occurring for the last 6 months  -Continue PPI twice daily -Full liquid diet  Chronic abdominal pain -Lipase 25 -06/09/2019 CT abdomen/pelvis negative for acute findings -Suspect this may be in part due to the patient's right-sided ventral hernia -Judicious opioids -UDS -UA and urine culture  Hyperglycemia -Check hemoglobin A1c  Hypothyroidism -Continue levothyroxine  Depression/anxiety -Continue Effexor        Past Medical History:  Diagnosis Date  . Anemia   . Anxiety   . Arthritis   . Blood transfusion    /w bleeding ulcer & post knee replacement- 2012  . Chronic kidney disease    h/o renal calculi, h/o lithotripsy  . Copper deficiency 06/17/2015  . Depression   . GERD (gastroesophageal reflux disease)    h/o bleeding ulcer, hosp., [facility]  . Headache(784.0)   . Hiatal hernia   . History of kidney stones   . Hypothyroidism   . Morbid obesity ([place])   . Neuralgia    spondylosis & stenosis  . Obsessive-compulsive disorder   . PONV (postoperative nausea and vomiting)   . Recurrent upper respiratory infection (URI)   . Shortness of breath    with exertion  .  Sleep apnea    [facility]- [facility], CPAP not used in 3 months, states she has a new machine  but hasn't used   . Thyroid disease    Past Surgical History:  Procedure Laterality Date  . ABDOMINAL HYSTERECTOMY    . ANTERIOR CERVICAL DECOMP/DISCECTOMY FUSION  12/09/2010   Procedure: ANTERIOR CERVICAL DECOMPRESSION/DISCECTOMY FUSION 3 LEVELS;  Surgeon: Ophelia Charter;   Location: [facility];  Service: Neurosurgery;  Laterality: N/A;  Cervical three-four,Cervical four-five,Cervical Five-Six,Cervical Six-Seven ANTERIOR CERVICAL DECOMPRESSION WITH FUSION INTERBODY PROTHESIS PLATING AND BONEGRAFT  . APPENDECTOMY    . BIOPSY N/A 11/14/2014   Procedure: GASTRIC BIOPSY;  Surgeon: Daneil Dolin, MD;  Location: [facility];  Service: Endoscopy;  Laterality: N/A;  . CESAREAN SECTION    . CHOLECYSTECTOMY    . COLONOSCOPY WITH PROPOFOL N/A 11/14/2014   RMR: redundant colon but otherwise normal  . ESOPHAGOGASTRODUODENOSCOPY (EGD) WITH PROPOFOL N/A 11/14/2014   RMR"s/p gastric surgery/anastomtic ulcer  . ESOPHAGOGASTRODUODENOSCOPY (EGD) WITH PROPOFOL N/A 09/15/2017   Procedure: ESOPHAGOGASTRODUODENOSCOPY (EGD) WITH PROPOFOL;  Surgeon: Daneil Dolin, MD;  Location: [facility];  Service: Endoscopy;  Laterality: N/A;  11:30am  . ESOPHAGOGASTRODUODENOSCOPY (EGD) WITH PROPOFOL N/A 04/04/2019   Procedure: ESOPHAGOGASTRODUODENOSCOPY (EGD) WITH PROPOFOL;  Surgeon: Daneil Dolin, MD;  Location: [facility];  Service: Endoscopy;  Laterality: N/A;  . EYE SURGERY     bilateral cataracts removed, w/IOL  . GASTRIC BYPASS  2007  . JOINT REPLACEMENT     07/2010 &  2002- respectively- both knees   . MALONEY DILATION N/A 09/15/2017   Procedure: Venia Minks DILATION;  Surgeon: Daneil Dolin, MD;  Location: [facility];  Service: Endoscopy;  Laterality: N/A;  . PARATHYROIDECTOMY    . PERIPHERALLY INSERTED CENTRAL CATHETER INSERTION     PICC line for treatment of MRSA  . TONSILLECTOMY     as a child   Social History:  reports that she quit smoking about 21 years ago. Her smoking use included cigarettes. She has a 30.00 pack-year smoking history. She has never used smokeless tobacco. She reports that she does not drink alcohol or use drugs.   Family History  Problem Relation Age of Onset  . Anxiety disorder Mother   . Anxiety disorder Sister   . Bipolar disorder Daughter   . OCD  Daughter   . ADD / ADHD Daughter   . Heart attack Father   . Anesthesia problems Neg Hx   . Hypotension Neg Hx   . Malignant hyperthermia Neg Hx   . Pseudochol deficiency Neg Hx   . Dementia Neg Hx   . Alcohol abuse Neg Hx   . Drug abuse Neg Hx   . Depression Neg Hx   . Paranoid behavior Neg Hx   . Schizophrenia Neg Hx   . Seizures Neg Hx   . Sexual abuse Neg Hx   . Physical abuse Neg Hx   . Colon cancer Neg Hx      Allergies  Allergen Reactions  . Benzodiazepines Other (See Comments)    Cloudy thinking, memory loss, withdrawal symptoms when trying to stop without any other medication for detox.   . Codeine Nausea Only  . Escitalopram Oxalate Nausea Only  . Nsaids Other (See Comments)    Bleeding ulcer   . Sertraline Hcl Nausea Only  . Statins Nausea And Vomiting  . Tramadol Hcl Other (See Comments)    Daughter doesn't want mother to take due to the fact that  the medication made the daughter have a seizure      Prior to Admission medications   Medication Sig Start Date End Date Taking? Authorizing Provider  Cyanocobalamin (VITAMIN B-12 IJ) Inject 1 Applicatorful as directed every 30 (thirty) days.    Yes [provider]  levothyroxine (SYNTHROID, LEVOTHROID) 75 MCG tablet Take 75 mcg by mouth daily before breakfast.   Yes [provider]  pantoprazole (PROTONIX) 40 MG tablet Take 1 tablet (40 mg total) by mouth 2 (two) times daily before a meal. 04/05/19 06/09/19 Yes Shah, Pratik D, DO  venlafaxine (EFFEXOR) 75 MG tablet Take 75 mg by mouth 2 (two) times daily.    Yes [provider]  Vitamin D, Ergocalciferol, (DRISDOL) 50000 units CAPS capsule Take 50,000 Units by mouth every 7 (seven) days. Fridays   Yes [provider]  diclofenac Sodium (VOLTAREN) 1 % GEL Apply 2 g topically 4 (four) times daily.  02/19/19   [provider]  furosemide (LASIX) 40 MG tablet Take 40 mg by mouth as needed.    [provider]  ondansetron  (ZOFRAN) 4 MG tablet Take 1 tablet (4 mg total) by mouth every 8 (eight) hours as needed for nausea or vomiting. Patient not taking: Reported on 06/09/2019 05/28/19   Mahala Menghini, PA-C  oxyCODONE-acetaminophen (PERCOCET) 5-325 MG tablet Take 1-2 tablets by mouth every 8 (eight) hours as needed for severe pain. Patient not taking: Reported on 06/09/2019 04/05/19   Heath Lark D, DO  polyethylene glycol (MIRALAX / GLYCOLAX) 17 g packet Take 17 g by mouth daily as needed for mild constipation. Patient not taking: Reported on 06/09/2019 04/05/19   Heath Lark D, DO  sucralfate (CARAFATE) 1 GM/10ML suspension Take 10 mLs (1 g total) by mouth 4 (four) times daily -  with meals and at bedtime for 10 days. 04/05/19 04/15/19  Manuella Ghazi, Pratik D, DO  traMADol Veatrice Bourbon) 50 MG tablet SMARTSIG:1 Tablet(s) By Mouth 1 to 2 Times Daily 03/21/19   [provider]    Review of Systems:  Constitutional:  No weight loss, night sweats, Fevers, chills, fatigue.  Head&Eyes: No headache.  No vision loss.  No eye pain or scotoma ENT:  No Difficulty swallowing,Tooth/dental problems,Sore throat,  No ear ache, post nasal drip,  Cardio-vascular:  No  Orthopnea, PND, swelling in lower extremities,  dizziness, palpitations  GI:  No, diarrhea, loss of appetite, hematochezia, melena, heartburn, indigestion, Resp:  No  No cough. No coughing up of blood .No wheezing.No chest wall deformity  Skin:  no rash or lesions.  GU:  no dysuria, change in color of urine, no urgency or frequency. No flank pain.  Musculoskeletal:  No joint pain or swelling. No decreased range of motion. No back pain.  Psych:  No change in mood or affect. No depression or anxiety. Neurologic: No headache, no dysesthesia, no focal weakness, no vision loss. No syncope  Physical Exam: Vitals:   06/09/19 1355 06/09/19 1400 06/09/19 1430 06/09/19 1500  BP: (!) 145/77 (!) 142/80 (!) 144/84 (!) 141/93  Pulse: (!) 116 71 (!) 104 (!) 110  Resp: 18  (!) 26 15 17   Temp:      TempSrc:      SpO2: 100% 100% 99% 98%  Weight:      Height:       General:  A&O x 3, NAD, nontoxic, pleasant/cooperative Head/Eye: No conjunctival hemorrhage, no icterus, [place]/AT, No nystagmus ENT:  No icterus,  No thrush, good dentition, no pharyngeal  exudate Neck:  No masses, no lymphadenpathy, no bruits CV:  RRR, no rub, no gallop, no S3 Lung:  CTAB, good air movement, no wheeze, no rhonchi Abdomen: soft/peribumbilical and R-sided abd pain, +BS, nondistended, no peritoneal signs Ext: No cyanosis, No rashes, No petechiae, No lymphangitis, No edema Neuro: CNII-XII intact, strength 4/5 in bilateral upper and lower extremities, no dysmetria  Labs on Admission:  Basic Metabolic Panel: Recent Labs  Lab 06/09/19 1115  NA 140  K 3.5  CL 107  CO2 24  GLUCOSE 138*  BUN 20  CREATININE 1.25*  CALCIUM 8.9   Liver Function Tests: Recent Labs  Lab 06/09/19 1115  AST 16  ALT 9  ALKPHOS 61  BILITOT 0.7  PROT 6.5  ALBUMIN 3.8   Recent Labs  Lab 06/09/19 1115  LIPASE 25   No results for input(s): AMMONIA in the last 168 hours. CBC: Recent Labs  Lab 06/09/19 1115  WBC 7.4  NEUTROABS 5.4  HGB 9.1*  HCT 31.2*  MCV 79.0*  PLT 292   Coagulation Profile: No results for input(s): INR, PROTIME in the last 168 hours. Cardiac Enzymes: No results for input(s): CKTOTAL, CKMB, CKMBINDEX, TROPONINI in the last 168 hours. BNP: Invalid input(s): POCBNP CBG: No results for input(s): GLUCAP in the last 168 hours. Urine analysis:    Component Value Date/Time   COLORURINE STRAW (A) 04/03/2019 1037   APPEARANCEUR CLEAR 04/03/2019 1037   LABSPEC 1.016 04/03/2019 1037   PHURINE 8.0 04/03/2019 1037   GLUCOSEU NEGATIVE 04/03/2019 1037   HGBUR NEGATIVE 04/03/2019 1037   BILIRUBINUR NEGATIVE 04/03/2019 1037   KETONESUR NEGATIVE 04/03/2019 1037   PROTEINUR NEGATIVE 04/03/2019 1037   UROBILINOGEN 0.2 05/09/2012 1741   NITRITE NEGATIVE 04/03/2019 1037    LEUKOCYTESUR TRACE (A) 04/03/2019 1037   Sepsis Labs: @LABRCNTIP (procalcitonin:4,lacticidven:4) )No results found for this or any previous visit (from the past 240 hour(s)).   Radiological Exams on Admission: CT Abdomen Pelvis W Contrast  Result Date: 06/09/2019 CLINICAL DATA:  Generalized abdominal pain for several hours EXAM: CT ABDOMEN AND PELVIS WITH CONTRAST TECHNIQUE: Multidetector CT imaging of the abdomen and pelvis was performed using the standard protocol following bolus administration of intravenous contrast. CONTRAST:  42mL OMNIPAQUE IOHEXOL 300 MG/ML  SOLN COMPARISON:  None. FINDINGS: Lower chest: No acute abnormality. Hepatobiliary: No focal liver abnormality is seen. Status post cholecystectomy. No biliary dilatation. Pancreas: Unremarkable. No pancreatic ductal dilatation or surrounding inflammatory changes. Spleen: Normal in size without focal abnormality. Adrenals/Urinary Tract: Adrenal glands are within normal limits. Kidneys demonstrate a normal enhancement pattern. No obstructive changes are seen. No renal calculi are noted. The bladder is well distended. Stomach/Bowel: Appendix has been surgically removed. No obstructive or inflammatory changes of the colon or small bowel are seen. Postsurgical changes in the stomach are noted consistent with gastric bypass. Vascular/Lymphatic: Aortic atherosclerosis. No enlarged abdominal or pelvic lymph nodes. Reproductive: Status post hysterectomy. No adnexal masses. Other: No abdominal wall hernia or abnormality. No abdominopelvic ascites. Musculoskeletal: Degenerative changes of lumbar spine are noted. Postsurgical changes are seen at L4-5. IMPRESSION: Postsurgical changes without acute abnormality. Electronically Signed   By: Inez Catalina M.D.   On: 06/09/2019 14:05   DG Chest Port 1 View  Result Date: 06/09/2019 CLINICAL DATA:  74 year old female with a history of dyspnea EXAM: PORTABLE CHEST 1 VIEW COMPARISON:  04/20/2012 FINDINGS: The  cardiac border appears slightly more prominent than the most recent comparison, potentially accentuated by apical lordotic positioning. Calcifications of the aortic arch. No  pneumothorax or pleural effusion. No confluent airspace disease. Coarsened interstitial markings similar to the prior. No displaced fracture. Surgical changes of the cervical region. IMPRESSION: Negative for acute cardiopulmonary disease. Electronically Signed   By: Corrie Mckusick D.O.   On: 06/09/2019 12:04    EKG: Independently reviewed. Afib, nonspecific STT changes    Time spent:60 minutes Code Status:   FULL Family Communication:  No Family at bedside Disposition Plan: expect 1-2 day hospitalization Consults called: none DVT Prophylaxis: IV heparin  Orson Eva, DO  [facility] Pager [phone]  If 7PM-7AM, please contact night-coverage [email or web address] Password [facility] 06/09/2019, 3:07 PM

## 2019-06-10 ENCOUNTER — Observation Stay (HOSPITAL_COMMUNITY): Payer: PPO

## 2019-06-10 DIAGNOSIS — M549 Dorsalgia, unspecified: Secondary | ICD-10-CM | POA: Diagnosis not present

## 2019-06-10 DIAGNOSIS — I10 Essential (primary) hypertension: Secondary | ICD-10-CM | POA: Diagnosis not present

## 2019-06-10 DIAGNOSIS — R531 Weakness: Secondary | ICD-10-CM | POA: Diagnosis present

## 2019-06-10 DIAGNOSIS — K219 Gastro-esophageal reflux disease without esophagitis: Secondary | ICD-10-CM | POA: Diagnosis present

## 2019-06-10 DIAGNOSIS — R739 Hyperglycemia, unspecified: Secondary | ICD-10-CM | POA: Diagnosis present

## 2019-06-10 DIAGNOSIS — E7800 Pure hypercholesterolemia, unspecified: Secondary | ICD-10-CM | POA: Diagnosis present

## 2019-06-10 DIAGNOSIS — R1084 Generalized abdominal pain: Secondary | ICD-10-CM | POA: Diagnosis present

## 2019-06-10 DIAGNOSIS — Z20822 Contact with and (suspected) exposure to covid-19: Secondary | ICD-10-CM | POA: Diagnosis present

## 2019-06-10 DIAGNOSIS — E039 Hypothyroidism, unspecified: Secondary | ICD-10-CM | POA: Diagnosis present

## 2019-06-10 DIAGNOSIS — R079 Chest pain, unspecified: Secondary | ICD-10-CM | POA: Diagnosis not present

## 2019-06-10 DIAGNOSIS — I129 Hypertensive chronic kidney disease with stage 1 through stage 4 chronic kidney disease, or unspecified chronic kidney disease: Secondary | ICD-10-CM | POA: Diagnosis present

## 2019-06-10 DIAGNOSIS — I4819 Other persistent atrial fibrillation: Secondary | ICD-10-CM | POA: Diagnosis present

## 2019-06-10 DIAGNOSIS — R1314 Dysphagia, pharyngoesophageal phase: Secondary | ICD-10-CM | POA: Diagnosis not present

## 2019-06-10 DIAGNOSIS — G8929 Other chronic pain: Secondary | ICD-10-CM | POA: Diagnosis present

## 2019-06-10 DIAGNOSIS — I361 Nonrheumatic tricuspid (valve) insufficiency: Secondary | ICD-10-CM

## 2019-06-10 DIAGNOSIS — Z888 Allergy status to other drugs, medicaments and biological substances status: Secondary | ICD-10-CM | POA: Diagnosis not present

## 2019-06-10 DIAGNOSIS — Z9884 Bariatric surgery status: Secondary | ICD-10-CM | POA: Diagnosis not present

## 2019-06-10 DIAGNOSIS — G5603 Carpal tunnel syndrome, bilateral upper limbs: Secondary | ICD-10-CM | POA: Diagnosis present

## 2019-06-10 DIAGNOSIS — Z886 Allergy status to analgesic agent status: Secondary | ICD-10-CM | POA: Diagnosis not present

## 2019-06-10 DIAGNOSIS — R208 Other disturbances of skin sensation: Secondary | ICD-10-CM | POA: Diagnosis present

## 2019-06-10 DIAGNOSIS — I4892 Unspecified atrial flutter: Secondary | ICD-10-CM | POA: Diagnosis present

## 2019-06-10 DIAGNOSIS — R112 Nausea with vomiting, unspecified: Secondary | ICD-10-CM | POA: Diagnosis not present

## 2019-06-10 DIAGNOSIS — R197 Diarrhea, unspecified: Secondary | ICD-10-CM | POA: Diagnosis not present

## 2019-06-10 DIAGNOSIS — D649 Anemia, unspecified: Secondary | ICD-10-CM | POA: Diagnosis present

## 2019-06-10 DIAGNOSIS — E785 Hyperlipidemia, unspecified: Secondary | ICD-10-CM | POA: Diagnosis present

## 2019-06-10 DIAGNOSIS — I4891 Unspecified atrial fibrillation: Secondary | ICD-10-CM | POA: Diagnosis not present

## 2019-06-10 DIAGNOSIS — R109 Unspecified abdominal pain: Secondary | ICD-10-CM | POA: Diagnosis not present

## 2019-06-10 DIAGNOSIS — I340 Nonrheumatic mitral (valve) insufficiency: Secondary | ICD-10-CM

## 2019-06-10 DIAGNOSIS — R7302 Impaired glucose tolerance (oral): Secondary | ICD-10-CM | POA: Diagnosis present

## 2019-06-10 DIAGNOSIS — Z6836 Body mass index (BMI) 36.0-36.9, adult: Secondary | ICD-10-CM | POA: Diagnosis not present

## 2019-06-10 DIAGNOSIS — Z885 Allergy status to narcotic agent status: Secondary | ICD-10-CM | POA: Diagnosis not present

## 2019-06-10 DIAGNOSIS — F419 Anxiety disorder, unspecified: Secondary | ICD-10-CM | POA: Diagnosis present

## 2019-06-10 DIAGNOSIS — F429 Obsessive-compulsive disorder, unspecified: Secondary | ICD-10-CM | POA: Diagnosis present

## 2019-06-10 DIAGNOSIS — N1832 Chronic kidney disease, stage 3b: Secondary | ICD-10-CM | POA: Diagnosis present

## 2019-06-10 DIAGNOSIS — R209 Unspecified disturbances of skin sensation: Secondary | ICD-10-CM | POA: Diagnosis not present

## 2019-06-10 LAB — BASIC METABOLIC PANEL
Anion gap: 9 (ref 5–15)
BUN: 18 mg/dL (ref 8–23)
CO2: 24 mmol/L (ref 22–32)
Calcium: 8.8 mg/dL — ABNORMAL LOW (ref 8.9–10.3)
Chloride: 109 mmol/L (ref 98–111)
Creatinine, Ser: 1.07 mg/dL — ABNORMAL HIGH (ref 0.44–1.00)
GFR calc Af Amer: >60 mL/min (ref >60)
GFR calc non Af Amer: 52 mL/min — ABNORMAL LOW (ref >60)
Glucose, Bld: 123 mg/dL — ABNORMAL HIGH (ref 70–99)
Potassium: 3.6 mmol/L (ref 3.5–5.1)
Sodium: 142 mmol/L (ref 135–145)

## 2019-06-10 LAB — ECHOCARDIOGRAM COMPLETE
Height: 65 in
Weight: 3520 oz

## 2019-06-10 LAB — CBC
HCT: 28.1 % — ABNORMAL LOW (ref 36.0–46.0)
Hemoglobin: 8.3 g/dL — ABNORMAL LOW (ref 12.0–15.0)
MCH: 23.3 pg — ABNORMAL LOW (ref 26.0–34.0)
MCHC: 29.5 g/dL — ABNORMAL LOW (ref 30.0–36.0)
MCV: 78.9 fL — ABNORMAL LOW (ref 80.0–100.0)
Platelets: 266 10*3/uL (ref 150–400)
RBC: 3.56 MIL/uL — ABNORMAL LOW (ref 3.87–5.11)
RDW: 17 % — ABNORMAL HIGH (ref 11.5–15.5)
WBC: 6.8 10*3/uL (ref 4.0–10.5)
nRBC: 0 % (ref 0.0–0.2)

## 2019-06-10 LAB — HEPARIN LEVEL (UNFRACTIONATED)
Heparin Unfractionated: 0.55 IU/mL (ref 0.30–0.70)
Heparin Unfractionated: 0.71 IU/mL — ABNORMAL HIGH (ref 0.30–0.70)

## 2019-06-10 LAB — MAGNESIUM: Magnesium: 2 mg/dL (ref 1.7–2.4)

## 2019-06-10 MED ORDER — APIXABAN 5 MG PO TABS
5.0000 mg | ORAL_TABLET | Freq: Two times a day (BID) | ORAL | Status: DC
  Administered 2019-06-10 – 2019-06-11 (×3): 5 mg via ORAL
  Filled 2019-06-10 (×3): qty 1

## 2019-06-10 MED ORDER — DILTIAZEM HCL 30 MG PO TABS
30.0000 mg | ORAL_TABLET | Freq: Four times a day (QID) | ORAL | Status: AC
  Administered 2019-06-10 – 2019-06-12 (×7): 30 mg via ORAL
  Filled 2019-06-10 (×7): qty 1

## 2019-06-10 MED ORDER — FENTANYL CITRATE (PF) 100 MCG/2ML IJ SOLN
50.0000 ug | Freq: Once | INTRAMUSCULAR | Status: AC
  Administered 2019-06-10: 50 ug via INTRAVENOUS
  Filled 2019-06-10: qty 2

## 2019-06-10 NOTE — Progress Notes (Signed)
Patient's heart rate has been remaining stable in the 70-80's. Titrated Cardizem down to 2.5 mg at 0730. Still remaining stable in that range. Cardizem gtt turned off for now. Will monitor HR and see how it responds. MD made aware. Will continue to monitor.

## 2019-06-10 NOTE — Progress Notes (Addendum)
ANTICOAGULATION CONSULT NOTE - Initial Consult  Pharmacy Consult for heparin dosing-->conversion to apixaban Indication: atrial fibrillation  Patient Measurements: Height: 5\' 5"  (165.1 cm) Weight: 99.8 kg (220 lb) IBW/kg (Calculated) : 57 Heparin Dosing Weight: HEPARIN DW (KG): 79.8  Vital Signs: Temp: 98.1 F (36.7 C) (05/23 1131) Temp Source: Oral (05/23 1131) BP: 113/73 (05/23 1140) Pulse Rate: 80 (05/23 1214)  Labs: Recent Labs    06/09/19 1115 06/10/19 0011 06/10/19 0730  HGB 9.1* 8.3*  --   HCT 31.2* 28.1*  --   PLT 292 266  --   HEPARINUNFRC  --  0.55 0.71*  CREATININE 1.25* 1.07*  --     Estimated Creatinine Clearance: 55.6 mL/min (A) (by C-G formula based on SCr of 1.07 mg/dL (H)).  Assessment: Pharmacy consulted to dose heparin infusion for this 74yo female with atrial fibrillation and RVR.   Baseline Hb is below normal at 9.9mg /dL.  CXR  is negative for acute  cardiopulmonary disease. Patient wasn't taking any anti-coagulant medications prior to admission.   Goal of Therapy:  Heparin level 0.3-0.7 units/ml Monitor platelets by anticoagulation protocol: Yes   06/10/19 1300 update HL: 0.71 IU/mL at 0730 on heparin at 1000 units/hr CBC: Hb has dropped further to 8.3mg /dL, from 9.1mg /dL   RN reports no bleeding complications or issues with infusion site.    06/10/19 1400 apixaban dosing:  Indication: non-valvular atrial fibrillation  Criteria for dosing reviewed: 1)  Age >/= 80 years 2)  Weight </= 60 kg 3)  Serum Creatinine >/= 1.5  Drug/Drug Interaction reviewed: Apixaban and Diltiazem Pharmacologic effects and plasma concentrations of apixaban may be increased by moderate CYP3A4 inhibitors (e.g. Moderate CYP3A4 Inhibitors).   Plan:   - discontinue heparin drip at 1600 today  -  start Apixaban 5mg  twice daily  - Monitor for s/s of bleeding   - Provide education prior to discharge for this new medication.    Despina Pole 06/10/2019,12:58 PM

## 2019-06-10 NOTE — Progress Notes (Signed)
Pt transferred to room 305 via wheelchair. Pt has been medicated and assessed by myself. Report given to Linus Orn, Therapist, sports. All belongings transferred with pt.

## 2019-06-10 NOTE — Progress Notes (Signed)
  Echocardiogram 2D Echocardiogram has been performed.  [name] 06/10/2019, 12:39 PM

## 2019-06-10 NOTE — Progress Notes (Signed)
[place] for heparin Indication: atrial fibrillation  Allergies  Allergen Reactions  . Benzodiazepines Other (See Comments)    Cloudy thinking, memory loss, withdrawal symptoms when trying to stop without any other medication for detox.   . Codeine Nausea Only  . Escitalopram Oxalate Nausea Only  . Nsaids Other (See Comments)    Bleeding ulcer   . Sertraline Hcl Nausea Only  . Statins Nausea And Vomiting  . Tramadol Hcl Other (See Comments)    Daughter doesn't want mother to take due to the fact that the medication made the daughter have a seizure     Patient Measurements: Height: 5\' 5"  (165.1 cm) Weight: 99.8 kg (220 lb) IBW/kg (Calculated) : 57 Heparin Dosing Weight: HEPARIN DW (KG): 79.8  Vital Signs: Temp: 98.3 F (36.8 C) (05/22 1829) Temp Source: Oral (05/22 1829) BP: 106/57 (05/23 0000) Pulse Rate: 72 (05/23 0000)  Labs: Recent Labs    06/09/19 1115 06/10/19 0011  HGB 9.1* 8.3*  HCT 31.2* 28.1*  PLT 292 266  HEPARINUNFRC  --  0.55  CREATININE 1.25*  --     Estimated Creatinine Clearance: 47.6 mL/min (A) (by C-G formula based on SCr of 1.25 mg/dL (H)).  Assessment: 74 y.o. female with Afib for heparin  Goal of Therapy:  Heparin level 0.3-0.7 units/ml Monitor platelets by anticoagulation protocol: Yes   Plan:  Continue Heparin at current rate   Adelina Collard, Bronson Curb 06/10/2019,1:45 AM

## 2019-06-10 NOTE — Progress Notes (Addendum)
PROGRESS NOTE  Kendra CHABOLLA [id number] DOB: [birth year] DOA: 06/09/2019 PCP: Lemmie Evens, MD  Brief History:   74 y.o. female with medical history of GERD, hyperlipidemia, hypothyroidism, gastric bypass, hypertension, peptic ulcer disease, anxiety presenting with abdominal pain that woke her up from sleep in early a.m. 06/09/2019.  She stated that it is a periumbilical and right-sided.  She states that she has had intermittent abdominal pain in the same area since her gastric bypass surgery in 2008.  She cannot clarify any alleviating or aggravating factors.  Nonetheless, patient had worsening of her abdominal pain waking her up from sleep as discussed above.  She has chronic "retching and dry heaving" which she states has been unchanged.  She denies any hematemesis.  She denies any fevers, chills, diarrhea, hematochezia melena, dysuria, hematuria.  The patient has been having some shortness of breath at least for the last 6 months, worse with exertion.  She occasionally feels her heart fluttering during the same period of time.  She states that she has some intermittent chest discomfort lasting 2 to 5 minutes.  She is unable to clarify any exacerbating or alleviating factors.  She states that this is been going on for this last 6 months. Notably, the patient was admitted to the hospital from 04/03/2019 to 04/05/2019 during which time she was worked up for hematemesis.  EGD at that time showed a large anastomotic ulcer with smaller satellite ulcers.  It was felt that this may been related to her NSAID use the patient was discharged home with PPI twice daily and Carafate. In the emergency department, patient was afebrile hemodynamically stable.  Initial heart rate was in the 140-150 range.  The patient was started on diltiazem drip.  EKG showed atrial fibrillation/flutter with nonspecific ST-T wave changes.  Chest x-ray was negative for edema or consolidation.  CT of the abdomen pelvis showed  postoperative changes without any acute findings.  The patient was started on diltiazem drip and admitted for further evaluation.  Assessment/Plan: Atrial fibrillation with RVR, type unspecified -IV diltiazem>>>po diltiazem -Start IV heparin>>>apixaban -Echocardiogram--EF 55-60%, no WMA, mild MR/TR -TSH--4.005 -CHADS-VASc = 4 (HTN, age, ASVD, female)  Nausea and vomiting -Patient states that this has been occurring for the last 6 months  -Continue PPI twice daily -Full liquid diet>>>downgrade to clears -pt endorses continued vomiting, but no emesis can be substantiated by nursing staff -GI consult -04/04/19--EGD--large anastomotic ulcer with small satellite ulcers  Chronic abdominal pain -Lipase 25 -06/09/2019 CT abdomen/pelvis negative for acute findings -Suspect this may be in part due to the patient's right-sided ventral hernia -Judicious opioids -UDS--positive opiates -UA--no significant pyuria  Hyperglycemia/Impaired glucose tolerance -Check hemoglobin A1c--6.2  Hypothyroidism -Continue levothyroxine  Depression/anxiety -Continue Effexor      Status is: Observation  The patient will require care spanning > 2 midnights and should be moved to inpatient because: Inpatient level of care appropriate due to severity of illness.  Patient still vomiting, unable to tolerate po intake  Dispo: The patient is from: Home              Anticipated d/c is to: Home              Anticipated d/c date is: 2 days              Patient currently is not medically stable to d/c.        Family Communication:   No Family at bedside  Consultants:  GI  Code Status:  FULL   DVT Prophylaxis: xarelto   Procedures: As Listed in Progress Note Above  Antibiotics: None       Subjective: Pt endorsed 2 episodes of vomiting last night.  Denies f/c, cp, sob, diarrhea, hematochezia, melena.  States abd pain is about the same  Objective: Vitals:   06/10/19 1030 06/10/19  1131 06/10/19 1140 06/10/19 1214  BP: (!) 94/52  113/73   Pulse: 77 76  80  Resp: 18 16    Temp:  98.1 F (36.7 C)    TempSrc:  Oral    SpO2: 100% 98%  100%  Weight:      Height:        Intake/Output Summary (Last 24 hours) at 06/10/2019 1408 Last data filed at 06/10/2019 0915 Gross per 24 hour  Intake 641.99 ml  Output 600 ml  Net 41.99 ml   Weight change:  Exam:   General:  Pt is alert, follows commands appropriately, not in acute distress  HEENT: No icterus, No thrush, No neck mass, [place]/AT  Cardiovascular: IRRR, S1/S2, no rubs, no gallops  Respiratory: bibasilar crackles. No wheeze  Abdomen: Soft/+BS, generalized tender, non distended, no guarding  Extremities: Nonpitting edema, No lymphangitis, No petechiae, No rashes, no synovitis   Data Reviewed: I have personally reviewed following labs and imaging studies Basic Metabolic Panel: Recent Labs  Lab 06/09/19 1115 06/10/19 0011  NA 140 142  K 3.5 3.6  CL 107 109  CO2 24 24  GLUCOSE 138* 123*  BUN 20 18  CREATININE 1.25* 1.07*  CALCIUM 8.9 8.8*  MG  --  2.0   Liver Function Tests: Recent Labs  Lab 06/09/19 1115  AST 16  ALT 9  ALKPHOS 61  BILITOT 0.7  PROT 6.5  ALBUMIN 3.8   Recent Labs  Lab 06/09/19 1115  LIPASE 25   No results for input(s): AMMONIA in the last 168 hours. Coagulation Profile: No results for input(s): INR, PROTIME in the last 168 hours. CBC: Recent Labs  Lab 06/09/19 1115 06/10/19 0011  WBC 7.4 6.8  NEUTROABS 5.4  --   HGB 9.1* 8.3*  HCT 31.2* 28.1*  MCV 79.0* 78.9*  PLT 292 266   Cardiac Enzymes: No results for input(s): CKTOTAL, CKMB, CKMBINDEX, TROPONINI in the last 168 hours. BNP: Invalid input(s): POCBNP CBG: No results for input(s): GLUCAP in the last 168 hours. HbA1C: Recent Labs    06/09/19 1115  HGBA1C 6.2*   Urine analysis:    Component Value Date/Time   COLORURINE YELLOW 06/09/2019 2100   APPEARANCEUR HAZY (A) 06/09/2019 2100   LABSPEC 1.033  (H) 06/09/2019 2100   PHURINE 6.0 06/09/2019 2100   GLUCOSEU NEGATIVE 06/09/2019 2100   HGBUR NEGATIVE 06/09/2019 2100   [place] NEGATIVE 06/09/2019 2100   [place] NEGATIVE 06/09/2019 2100   PROTEINUR NEGATIVE 06/09/2019 2100   UROBILINOGEN 0.2 05/09/2012 1741   NITRITE NEGATIVE 06/09/2019 2100   LEUKOCYTESUR LARGE (A) 06/09/2019 2100   Sepsis Labs: @LABRCNTIP (procalcitonin:4,lacticidven:4) ) Recent Results (from the past 240 hour(s))  SARS Coronavirus 2 by RT PCR (hospital order, performed in [facility] hospital lab) Nasopharyngeal Nasopharyngeal Swab     Status: None   Collection Time: 06/09/19  2:19 PM   Specimen: Nasopharyngeal Swab  Result Value Ref Range Status   SARS Coronavirus 2 NEGATIVE NEGATIVE Final    Comment: (NOTE) SARS-CoV-2 target nucleic acids are NOT DETECTED. The SARS-CoV-2 RNA is generally detectable in upper and lower respiratory specimens during  the acute phase of infection. The lowest concentration of SARS-CoV-2 viral copies this assay can detect is 250 copies / mL. A negative result does not preclude SARS-CoV-2 infection and should not be used as the sole basis for treatment or other patient management decisions.  A negative result may occur with improper specimen collection / handling, submission of specimen other than nasopharyngeal swab, presence of viral mutation(s) within the areas targeted by this assay, and inadequate number of viral copies (<250 copies / mL). A negative result must be combined with clinical observations, patient history, and epidemiological information. Fact Sheet for Patients:   StrictlyIdeas.no Fact Sheet for Healthcare Providers: BankingDealers.co.za This test is not yet approved or cleared  by the [place] FDA and has been authorized for detection and/or diagnosis of SARS-CoV-2 by FDA under an Emergency Use Authorization (EUA).  This EUA will remain in effect  (meaning this test can be used) for the duration of the COVID-19 declaration under Section 564(b)(1) of the Act, 21 U.S.C. section 360bbb-3(b)(1), unless the authorization is terminated or revoked sooner. Performed at [facility], [address], [place], [place] [address]   MRSA PCR Screening     Status: None   Collection Time: 06/09/19  4:04 PM   Specimen: Nasopharyngeal  Result Value Ref Range Status   MRSA by PCR NEGATIVE NEGATIVE Final    Comment:        The GeneXpert MRSA Assay (FDA approved for NASAL specimens only), is one component of a comprehensive MRSA colonization surveillance program. It is not intended to diagnose MRSA infection nor to guide or monitor treatment for MRSA infections. Performed at [facility], [address], [place], [place] [address]      Scheduled Meds: . Chlorhexidine Gluconate Cloth  6 each Topical Daily  . diltiazem  30 mg Oral Q6H  . levothyroxine  75 mcg Oral QAC breakfast  . pantoprazole  40 mg Oral BID AC  . venlafaxine  75 mg Oral BID   Continuous Infusions: . heparin 950 Units/hr (06/10/19 1313)    Procedures/Studies: CT Abdomen Pelvis W Contrast  Result Date: 06/09/2019 CLINICAL DATA:  Generalized abdominal pain for several hours EXAM: CT ABDOMEN AND PELVIS WITH CONTRAST TECHNIQUE: Multidetector CT imaging of the abdomen and pelvis was performed using the standard protocol following bolus administration of intravenous contrast. CONTRAST:  41mL OMNIPAQUE IOHEXOL 300 MG/ML  SOLN COMPARISON:  None. FINDINGS: Lower chest: No acute abnormality. Hepatobiliary: No focal liver abnormality is seen. Status post cholecystectomy. No biliary dilatation. Pancreas: Unremarkable. No pancreatic ductal dilatation or surrounding inflammatory changes. Spleen: Normal in size without focal abnormality. Adrenals/Urinary Tract: Adrenal glands are within normal limits. Kidneys demonstrate a normal enhancement pattern. No obstructive changes are seen. No renal  calculi are noted. The bladder is well distended. Stomach/Bowel: Appendix has been surgically removed. No obstructive or inflammatory changes of the colon or small bowel are seen. Postsurgical changes in the stomach are noted consistent with gastric bypass. Vascular/Lymphatic: Aortic atherosclerosis. No enlarged abdominal or pelvic lymph nodes. Reproductive: Status post hysterectomy. No adnexal masses. Other: No abdominal wall hernia or abnormality. No abdominopelvic ascites. Musculoskeletal: Degenerative changes of lumbar spine are noted. Postsurgical changes are seen at L4-5. IMPRESSION: Postsurgical changes without acute abnormality. Electronically Signed   By: Inez Catalina M.D.   On: 06/09/2019 14:05   DG Chest Port 1 View  Result Date: 06/09/2019 CLINICAL DATA:  74 year old female with a history of dyspnea EXAM: PORTABLE CHEST 1 VIEW COMPARISON:  04/20/2012 FINDINGS: The  cardiac border appears slightly more prominent than the most recent comparison, potentially accentuated by apical lordotic positioning. Calcifications of the aortic arch. No pneumothorax or pleural effusion. No confluent airspace disease. Coarsened interstitial markings similar to the prior. No displaced fracture. Surgical changes of the cervical region. IMPRESSION: Negative for acute cardiopulmonary disease. Electronically Signed   By: Corrie Mckusick D.O.   On: 06/09/2019 12:04   ECHOCARDIOGRAM COMPLETE  Result Date: 06/10/2019    ECHOCARDIOGRAM REPORT   Patient Name:   [name] Date of Exam: 06/10/2019 Medical Rec #:  [id number]    Height:       65.0 in Accession #:    [id number]   Weight:       220.0 lb Date of Birth:  10-08-1945    BSA:          2.060 m Patient Age:    58 years     BP:           113/73 mmHg Patient Gender: F            HR:           83 bpm. Exam Location:  [facility] Procedure: 2D Echo Indications:    atrial fibrillation  History:        Patient has prior history of Echocardiogram examinations, most                  recent 05/25/2012.  Sonographer:    Johny Chess RDCS Referring Phys: [id number] Jenesa Foresta IMPRESSIONS  1. Left ventricular ejection fraction, by estimation, is 55 to 60%. The left ventricle has normal function. The left ventricle has no regional wall motion abnormalities. There is mild concentric left ventricular hypertrophy. Left ventricular diastolic parameters are indeterminate.  2. Right ventricular systolic function is normal. The right ventricular size is normal. There is normal pulmonary artery systolic pressure.  3. Left atrial size was severely dilated.  4. Right atrial size was mildly dilated.  5. The mitral valve is degenerative. Mild mitral valve regurgitation.  6. The aortic valve is tricuspid. Aortic valve regurgitation is not visualized. No aortic stenosis is present.  7. The inferior vena cava is dilated in size with >50% respiratory variability, suggesting right atrial pressure of 8 mmHg. FINDINGS  Left Ventricle: Left ventricular ejection fraction, by estimation, is 55 to 60%. The left ventricle has normal function. The left ventricle has no regional wall motion abnormalities. The left ventricular internal cavity size was normal in size. There is  mild concentric left ventricular hypertrophy. Left ventricular diastolic parameters are indeterminate. Right Ventricle: The right ventricular size is normal. No increase in right ventricular wall thickness. Right ventricular systolic function is normal. There is normal pulmonary artery systolic pressure. The tricuspid regurgitant velocity is 2.38 m/s, and  with an assumed right atrial pressure of 8 mmHg, the estimated right ventricular systolic pressure is [id number] mmHg. Left Atrium: Left atrial size was severely dilated. Right Atrium: Right atrial size was mildly dilated. Pericardium: There is no evidence of pericardial effusion. Mitral Valve: The mitral valve is degenerative in appearance. There is mild thickening of the mitral valve leaflet(s). Mild mitral  annular calcification. Mild mitral valve regurgitation. Tricuspid Valve: The tricuspid valve is grossly normal. Tricuspid valve regurgitation is mild. Aortic Valve: The aortic valve is tricuspid. . There is mild thickening and mild calcification of the aortic valve. Aortic valve regurgitation is not visualized. No aortic stenosis is present. Mild aortic valve annular calcification. There is mild thickening  of the aortic valve. There is mild calcification of the aortic valve. Pulmonic Valve: The pulmonic valve was grossly normal. Pulmonic valve regurgitation is not visualized. Aorta: The aortic root is normal in size and structure. Venous: The inferior vena cava is dilated in size with greater than 50% respiratory variability, suggesting right atrial pressure of 8 mmHg. IAS/Shunts: No atrial level shunt detected by color flow Doppler.  LEFT VENTRICLE PLAX 2D LVIDd:         4.89 cm LVIDs:         3.88 cm LV PW:         1.02 cm LV IVS:        1.18 cm  RIGHT VENTRICLE             IVC RV S prime:     12.10 cm/s  IVC diam: 2.25 cm LEFT ATRIUM             Index       RIGHT ATRIUM           Index LA diam:        5.10 cm 2.48 cm/m  RA Area:     17.10 cm LA Vol (A2C):   65.0 ml 31.55 ml/m RA Volume:   42.20 ml  20.49 ml/m LA Vol (A4C):   87.3 ml 42.38 ml/m LA Biplane Vol: 80.4 ml 39.03 ml/m  AORTIC VALVE LVOT Vmax:   75.20 cm/s LVOT Vmean:  51.400 cm/s LVOT VTI:    0.155 m  AORTA Ao Root diam: 3.20 cm Ao Asc diam:  3.40 cm TRICUSPID VALVE TR Peak grad:   22.7 mmHg TR Vmax:        238.00 cm/s  SHUNTS Systemic VTI: 0.16 m Kate Sable MD Electronically signed by Kate Sable MD Signature Date/Time: 06/10/2019/1:43:49 PM    Final     Orson Eva, DO  [facility]  If 7PM-7AM, please contact night-coverage [email or web address] Password [email or web address] 06/10/2019, 2:08 PM   LOS: 0 days

## 2019-06-11 ENCOUNTER — Encounter (HOSPITAL_COMMUNITY): Payer: Self-pay | Admitting: Internal Medicine

## 2019-06-11 DIAGNOSIS — R209 Unspecified disturbances of skin sensation: Secondary | ICD-10-CM

## 2019-06-11 DIAGNOSIS — R079 Chest pain, unspecified: Secondary | ICD-10-CM

## 2019-06-11 DIAGNOSIS — I10 Essential (primary) hypertension: Secondary | ICD-10-CM

## 2019-06-11 DIAGNOSIS — E785 Hyperlipidemia, unspecified: Secondary | ICD-10-CM

## 2019-06-11 DIAGNOSIS — I4819 Other persistent atrial fibrillation: Principal | ICD-10-CM

## 2019-06-11 LAB — BASIC METABOLIC PANEL
Anion gap: 10 (ref 5–15)
BUN: 21 mg/dL (ref 8–23)
CO2: 24 mmol/L (ref 22–32)
Calcium: 9.2 mg/dL (ref 8.9–10.3)
Chloride: 103 mmol/L (ref 98–111)
Creatinine, Ser: 1.28 mg/dL — ABNORMAL HIGH (ref 0.44–1.00)
GFR calc Af Amer: 48 mL/min — ABNORMAL LOW (ref >60)
GFR calc non Af Amer: 42 mL/min — ABNORMAL LOW (ref >60)
Glucose, Bld: 95 mg/dL (ref 70–99)
Potassium: 3.8 mmol/L (ref 3.5–5.1)
Sodium: 137 mmol/L (ref 135–145)

## 2019-06-11 LAB — URINE CULTURE: Culture: <10000 — AB

## 2019-06-11 LAB — CBC
HCT: 29.6 % — ABNORMAL LOW (ref 36.0–46.0)
Hemoglobin: 8.4 g/dL — ABNORMAL LOW (ref 12.0–15.0)
MCH: 22.8 pg — ABNORMAL LOW (ref 26.0–34.0)
MCHC: 28.4 g/dL — ABNORMAL LOW (ref 30.0–36.0)
MCV: 80.2 fL (ref 80.0–100.0)
Platelets: 261 10*3/uL (ref 150–400)
RBC: 3.69 MIL/uL — ABNORMAL LOW (ref 3.87–5.11)
RDW: 17.2 % — ABNORMAL HIGH (ref 11.5–15.5)
WBC: 6.1 10*3/uL (ref 4.0–10.5)
nRBC: 0 % (ref 0.0–0.2)

## 2019-06-11 LAB — MAGNESIUM: Magnesium: 2.1 mg/dL (ref 1.7–2.4)

## 2019-06-11 MED ORDER — DILTIAZEM HCL ER COATED BEADS 120 MG PO CP24
120.0000 mg | ORAL_CAPSULE | Freq: Every day | ORAL | Status: DC
  Administered 2019-06-12 – 2019-06-15 (×4): 120 mg via ORAL
  Filled 2019-06-11 (×4): qty 1

## 2019-06-11 MED ORDER — FENTANYL CITRATE (PF) 100 MCG/2ML IJ SOLN
50.0000 ug | Freq: Once | INTRAMUSCULAR | Status: AC
  Administered 2019-06-11: 50 ug via INTRAVENOUS
  Filled 2019-06-11: qty 2

## 2019-06-11 NOTE — Progress Notes (Addendum)
CT reviewed with radiologist, Dr. Golden Circle. No evidence for internal hernia. He also did not appreciate abdominal wall hernia on this exam. Previously documented on prior CTs (fat-containing right lateral abdominal wall hernia in Feb 2021).   Holding Eliquis starting with this evening's dose in preparation for EGD with Propofol later this week.

## 2019-06-11 NOTE — Consult Note (Addendum)
Cardiology Consultation:   Patient ID: [name] MRN: [id number]; DOB: 1945/05/30  Admit date: 06/09/2019 Date of Consult: 06/11/2019  Primary Care Provider: Lemmie Evens, MD Primary Cardiologist: Jenkins Rouge, MD (last seen 2014) Primary Electrophysiologist:  None    Patient Profile:   [name] is a 74 y.o. female with a history of hypertension, hyperlipidemia, palpitations (no arrhythmia as of 2014) who is being seen today for the evaluation of newly documented atrial fibrillation with RVR at the request of Dr. Carles Collet  History of Present Illness:   [name] is admitted with recurring nausea and vomiting and abdominal pain, found to be in atrial fibrillation with RVR at presentation.  2D echo LVEF 55 to 60% with mild MR/TR CHA2DS2-VASc equals 4.  Patient saw Dr.Nishan in 2014 with palpitations, cardiac event monitor showed normal sinus rhythm with PACs no significant arrhythmia.  Patient says she's had irregular fast heart beat for a long time but now only documented. If she has any pain or gets upset her heart races. She is very inactive due to back problems. Has had chest heaviness when it races and family history of CAD-father and sister CAD 19 y o.. History of smoking-20 pk years but quit 20 yrs ago. Used to take BP meds but stopped because they weren't helping. History of syncope x 4-most recent one 4 yrs ago  in setting of GI bleed/anemia.   Past Medical History:  Diagnosis Date   Anemia    Anxiety    Arthritis    Chronic back pain    Spondylosis and stenosis   Depression    GERD (gastroesophageal reflux disease)    Headache(784.0)    Hiatal hernia    History of blood transfusion    History of GI bleed    Hypothyroidism    Nephrolithiasis    Obsessive-compulsive disorder    Recurrent upper respiratory infection (URI)    Sleep apnea     Past Surgical History:  Procedure Laterality Date   ABDOMINAL HYSTERECTOMY     ANTERIOR CERVICAL  DECOMP/DISCECTOMY FUSION  12/09/2010   Procedure: ANTERIOR CERVICAL DECOMPRESSION/DISCECTOMY FUSION 3 LEVELS;  Surgeon: Ophelia Charter;  Location: [place] [facility];  Service: Neurosurgery;  Laterality: N/A;  Cervical three-four,Cervical four-five,Cervical Five-Six,Cervical Six-Seven ANTERIOR CERVICAL DECOMPRESSION WITH FUSION INTERBODY PROTHESIS PLATING AND BONEGRAFT   APPENDECTOMY     BIOPSY N/A 11/14/2014   Procedure: GASTRIC BIOPSY;  Surgeon: Daneil Dolin, MD;  Location: [facility];  Service: Endoscopy;  Laterality: N/A;   CESAREAN SECTION     CHOLECYSTECTOMY     COLONOSCOPY WITH PROPOFOL N/A 11/14/2014   RMR: redundant colon but otherwise normal   ESOPHAGOGASTRODUODENOSCOPY (EGD) WITH PROPOFOL N/A 11/14/2014   RMR"s/p gastric surgery/anastomtic ulcer   ESOPHAGOGASTRODUODENOSCOPY (EGD) WITH PROPOFOL N/A 09/15/2017   Procedure: ESOPHAGOGASTRODUODENOSCOPY (EGD) WITH PROPOFOL;  Surgeon: Daneil Dolin, MD;  Location: [facility];  Service: Endoscopy;  Laterality: N/A;  11:30am   ESOPHAGOGASTRODUODENOSCOPY (EGD) WITH PROPOFOL N/A 04/04/2019   Procedure: ESOPHAGOGASTRODUODENOSCOPY (EGD) WITH PROPOFOL;  Surgeon: Daneil Dolin, MD;  Location: [facility];  Service: Endoscopy;  Laterality: N/A;   EYE SURGERY     bilateral cataracts removed, w/IOL   GASTRIC BYPASS  2007   JOINT REPLACEMENT     07/2010 &  2002- respectively- both knees    MALONEY DILATION N/A 09/15/2017   Procedure: MALONEY DILATION;  Surgeon: Daneil Dolin, MD;  Location: [facility];  Service: Endoscopy;  Laterality: N/A;   PARATHYROIDECTOMY  PERIPHERALLY INSERTED CENTRAL CATHETER INSERTION     PICC line for treatment of MRSA   TONSILLECTOMY     as a child     Home Medications:  Prior to Admission medications   Medication Sig Start Date End Date Taking? Authorizing Provider  Cyanocobalamin (VITAMIN B-12 IJ) Inject 1 Applicatorful as directed every 30 (thirty) days.    Yes [provider]    levothyroxine (SYNTHROID, LEVOTHROID) 75 MCG tablet Take 75 mcg by mouth daily before breakfast.   Yes [provider]  pantoprazole (PROTONIX) 40 MG tablet Take 1 tablet (40 mg total) by mouth 2 (two) times daily before a meal. 04/05/19 06/09/19 Yes Shah, Pratik D, DO  venlafaxine (EFFEXOR) 75 MG tablet Take 75 mg by mouth 2 (two) times daily.    Yes [provider]  Vitamin D, Ergocalciferol, (DRISDOL) 50000 units CAPS capsule Take 50,000 Units by mouth every 7 (seven) days. Fridays   Yes [provider]  diclofenac Sodium (VOLTAREN) 1 % GEL Apply 2 g topically 4 (four) times daily.  02/19/19   [provider]  furosemide (LASIX) 40 MG tablet Take 40 mg by mouth as needed.    [provider]  ondansetron (ZOFRAN) 4 MG tablet Take 1 tablet (4 mg total) by mouth every 8 (eight) hours as needed for nausea or vomiting. Patient not taking: Reported on 06/09/2019 05/28/19   Mahala Menghini, PA-C  oxyCODONE-acetaminophen (PERCOCET) 5-325 MG tablet Take 1-2 tablets by mouth every 8 (eight) hours as needed for severe pain. Patient not taking: Reported on 06/09/2019 04/05/19   Heath Lark D, DO  polyethylene glycol (MIRALAX / GLYCOLAX) 17 g packet Take 17 g by mouth daily as needed for mild constipation. Patient not taking: Reported on 06/09/2019 04/05/19   Heath Lark D, DO  sucralfate (CARAFATE) 1 GM/10ML suspension Take 10 mLs (1 g total) by mouth 4 (four) times daily -  with meals and at bedtime for 10 days. 04/05/19 04/15/19  Manuella Ghazi, Pratik D, DO  traMADol Veatrice Bourbon) 50 MG tablet SMARTSIG:1 Tablet(s) By Mouth 1 to 2 Times Daily 03/21/19   [provider]    Inpatient Medications: Scheduled Meds:  apixaban  5 mg Oral BID   Chlorhexidine Gluconate Cloth  6 each Topical Daily   diltiazem  30 mg Oral Q6H   levothyroxine  75 mcg Oral QAC breakfast   pantoprazole  40 mg Oral BID AC   venlafaxine  75 mg Oral BID    PRN Meds: acetaminophen **OR**  acetaminophen, melatonin, ondansetron **OR** ondansetron (ZOFRAN) IV  Allergies:    Allergies  Allergen Reactions   Benzodiazepines Other (See Comments)    Cloudy thinking, memory loss, withdrawal symptoms when trying to stop without any other medication for detox.    Codeine Nausea Only   Escitalopram Oxalate Nausea Only   Nsaids Other (See Comments)    Bleeding ulcer    Sertraline Hcl Nausea Only   Statins Nausea And Vomiting   Tramadol Hcl Other (See Comments)    Daughter doesn't want mother to take due to the fact that the medication made the daughter have a seizure     Social History:   Social History   Socioeconomic History   Marital status: Widowed    Spouse name: Not on file   Number of children: Not on file   Years of education: Not on file   Highest education level: Not on file  Occupational History   Not on file  Tobacco Use  Smoking status: Former Smoker    Packs/day: 1.00    Years: 30.00    Pack years: 30.00    Types: Cigarettes    Quit date: 01/18/1998    Years since quitting: 21.4   Smokeless tobacco: Never Used  Substance and Sexual Activity   Alcohol use: No   Drug use: No   Sexual activity: Not on file  Other Topics Concern   Not on file  Social History Narrative   Not on file   Social Determinants of Health   Financial Resource Strain:    Difficulty of Paying Living Expenses:   Food Insecurity:    Worried About Charity fundraiser in the Last Year:    Arboriculturist in the Last Year:   Transportation Needs:    Film/video editor (Medical):    Lack of Transportation (Non-Medical):   Physical Activity:    Days of Exercise per Week:    Minutes of Exercise per Session:   Stress:    Feeling of Stress :   Social Connections:    Frequency of Communication with Friends and Family:    Frequency of Social Gatherings with Friends and Family:    Attends Religious Services:    Active Member of Clubs or  Organizations:    Attends Music therapist:    Marital Status:   Intimate Partner Violence:    Fear of Current or Ex-Partner:    Emotionally Abused:    Physically Abused:    Sexually Abused:     Family History:     Family History  Problem Relation Age of Onset   Anxiety disorder Mother    Anxiety disorder Sister    Bipolar disorder Daughter    OCD Daughter    ADD / ADHD Daughter    Heart attack Father    Anesthesia problems Neg Hx    Hypotension Neg Hx    Malignant hyperthermia Neg Hx    Pseudochol deficiency Neg Hx    Dementia Neg Hx    Alcohol abuse Neg Hx    Drug abuse Neg Hx    Depression Neg Hx    Paranoid behavior Neg Hx    Schizophrenia Neg Hx    Seizures Neg Hx    Sexual abuse Neg Hx    Physical abuse Neg Hx    Colon cancer Neg Hx      ROS:  Please see the history of present illness.  Review of Systems  Constitution: Negative.  HENT: Negative.   Eyes: Negative.   Cardiovascular: Positive for chest pain and irregular heartbeat.  Respiratory: Negative.   Hematologic/Lymphatic: Negative.   Musculoskeletal: Positive for back pain. Negative for joint pain.  Gastrointestinal: Positive for abdominal pain, heartburn, nausea and vomiting.  Genitourinary: Negative.   Neurological: Positive for weakness.   All other ROS reviewed and negative.     Physical Exam/Data:   Vitals:   06/10/19 2159 06/11/19 0205 06/11/19 0606 06/11/19 0820  BP: (!) 126/102 129/84 116/75   Pulse: 78 76 88   Resp: 20  20   Temp: 98.8 F (37.1 C) 98.4 F (36.9 C) 98.3 F (36.8 C)   TempSrc: Oral Oral Oral   SpO2: 99% 97% 100% 96%  Weight:      Height:       No intake or output data in the 24 hours ending 06/11/19 1105 Last 3 Weights 06/09/2019 04/03/2019 04/03/2019  Weight (lbs) 220 lb 220 lb 220 lb  Weight (kg) 99.791  kg 99.79 kg 99.791 kg  Some encounter information is confidential and restricted. Go to Review Flowsheets activity to see  all data.     Body mass index is 36.61 kg/m.  General:  Obese, in no acute distress HEENT: normal Lymph: no adenopathy Neck: no JVD Endocrine:  No thryomegaly Vascular: No carotid bruits; FA pulses 2+ bilaterally without bruits  Cardiac:  normal S1, S2; irreg irreg Lungs:  clear to auscultation bilaterally, no wheezing, rhonchi or rales  Abd: soft, nontender, no hepatomegaly  Ext: no edema Musculoskeletal:  No deformities, BUE and BLE strength normal and equal Skin: warm and dry  Neuro:  CNs 2-12 intact, no focal abnormalities noted Psych:  Normal affect   EKG:  The EKG was personally reviewed and demonstrates:Afib 145/m 06/09/19 Telemetry:  Telemetry was personally reviewed and demonstrates:  Afib with controlled rate  Relevant CV Studies: Monitor in 2014 normal sinus rhythm with PACs  Laboratory Data:  High Sensitivity Troponin:  No results for input(s): TROPONINIHS in the last 720 hours.   Chemistry Recent Labs  Lab 06/09/19 1115 06/10/19 0011 06/11/19 0523  NA 140 142 137  K 3.5 3.6 3.8  CL 107 109 103  CO2 24 24 24   GLUCOSE 138* 123* 95  BUN 20 18 21   CREATININE 1.25* 1.07* 1.28*  CALCIUM 8.9 8.8* 9.2  GFRNONAA 43* 52* 42*  GFRAA 50* >60 48*  ANIONGAP 9 9 10     Recent Labs  Lab 06/09/19 1115  PROT 6.5  ALBUMIN 3.8  AST 16  ALT 9  ALKPHOS 61  BILITOT 0.7   Hematology Recent Labs  Lab 06/09/19 1115 06/10/19 0011 06/11/19 0523  WBC 7.4 6.8 6.1  RBC 3.95 3.56* 3.69*  HGB 9.1* 8.3* 8.4*  HCT 31.2* 28.1* 29.6*  MCV 79.0* 78.9* 80.2  MCH 23.0* 23.3* 22.8*  MCHC 29.2* 29.5* 28.4*  RDW 17.2* 17.0* 17.2*  PLT 292 266 261    Radiology/Studies:  CT Abdomen Pelvis W Contrast  Result Date: 06/09/2019 CLINICAL DATA:  Generalized abdominal pain for several hours EXAM: CT ABDOMEN AND PELVIS WITH CONTRAST TECHNIQUE: Multidetector CT imaging of the abdomen and pelvis was performed using the standard protocol following bolus administration of intravenous  contrast. CONTRAST:  61mL OMNIPAQUE IOHEXOL 300 MG/ML  SOLN COMPARISON:  None. FINDINGS: Lower chest: No acute abnormality. Hepatobiliary: No focal liver abnormality is seen. Status post cholecystectomy. No biliary dilatation. Pancreas: Unremarkable. No pancreatic ductal dilatation or surrounding inflammatory changes. Spleen: Normal in size without focal abnormality. Adrenals/Urinary Tract: Adrenal glands are within normal limits. Kidneys demonstrate a normal enhancement pattern. No obstructive changes are seen. No renal calculi are noted. The bladder is well distended. Stomach/Bowel: Appendix has been surgically removed. No obstructive or inflammatory changes of the colon or small bowel are seen. Postsurgical changes in the stomach are noted consistent with gastric bypass. Vascular/Lymphatic: Aortic atherosclerosis. No enlarged abdominal or pelvic lymph nodes. Reproductive: Status post hysterectomy. No adnexal masses. Other: No abdominal wall hernia or abnormality. No abdominopelvic ascites. Musculoskeletal: Degenerative changes of lumbar spine are noted. Postsurgical changes are seen at L4-5. IMPRESSION: Postsurgical changes without acute abnormality. Electronically Signed   By: Inez Catalina M.D.   On: 06/09/2019 14:05   DG Chest Port 1 View  Result Date: 06/09/2019 CLINICAL DATA:  74 year old female with a history of dyspnea EXAM: PORTABLE CHEST 1 VIEW COMPARISON:  04/20/2012 FINDINGS: The cardiac border appears slightly more prominent than the most recent comparison, potentially accentuated by apical lordotic positioning. Calcifications of  the aortic arch. No pneumothorax or pleural effusion. No confluent airspace disease. Coarsened interstitial markings similar to the prior. No displaced fracture. Surgical changes of the cervical region. IMPRESSION: Negative for acute cardiopulmonary disease. Electronically Signed   By: Corrie Mckusick D.O.   On: 06/09/2019 12:04   ECHOCARDIOGRAM COMPLETE  Result Date:  06/10/2019    ECHOCARDIOGRAM REPORT   Patient Name:   SHAWNDREA IRIGOYEN Date of Exam: 06/10/2019 Medical Rec #:  [id number]    Height:       65.0 in Accession #:    [id number]   Weight:       220.0 lb Date of Birth:  31-May-1945    BSA:          2.060 m Patient Age:    63 years     BP:           113/73 mmHg Patient Gender: F            HR:           83 bpm. Exam Location:  [facility] Procedure: 2D Echo Indications:    atrial fibrillation  History:        Patient has prior history of Echocardiogram examinations, most                 recent 05/25/2012.  Sonographer:    Johny Chess RDCS Referring Phys: [phone] DAVID TAT IMPRESSIONS  1. Left ventricular ejection fraction, by estimation, is 55 to 60%. The left ventricle has normal function. The left ventricle has no regional wall motion abnormalities. There is mild concentric left ventricular hypertrophy. Left ventricular diastolic parameters are indeterminate.  2. Right ventricular systolic function is normal. The right ventricular size is normal. There is normal pulmonary artery systolic pressure.  3. Left atrial size was severely dilated.  4. Right atrial size was mildly dilated.  5. The mitral valve is degenerative. Mild mitral valve regurgitation.  6. The aortic valve is tricuspid. Aortic valve regurgitation is not visualized. No aortic stenosis is present.  7. The inferior vena cava is dilated in size with >50% respiratory variability, suggesting right atrial pressure of 8 mmHg. FINDINGS  Left Ventricle: Left ventricular ejection fraction, by estimation, is 55 to 60%. The left ventricle has normal function. The left ventricle has no regional wall motion abnormalities. The left ventricular internal cavity size was normal in size. There is  mild concentric left ventricular hypertrophy. Left ventricular diastolic parameters are indeterminate. Right Ventricle: The right ventricular size is normal. No increase in right ventricular wall thickness. Right ventricular systolic  function is normal. There is normal pulmonary artery systolic pressure. The tricuspid regurgitant velocity is 2.38 m/s, and  with an assumed right atrial pressure of 8 mmHg, the estimated right ventricular systolic pressure is [id number] mmHg. Left Atrium: Left atrial size was severely dilated. Right Atrium: Right atrial size was mildly dilated. Pericardium: There is no evidence of pericardial effusion. Mitral Valve: The mitral valve is degenerative in appearance. There is mild thickening of the mitral valve leaflet(s). Mild mitral annular calcification. Mild mitral valve regurgitation. Tricuspid Valve: The tricuspid valve is grossly normal. Tricuspid valve regurgitation is mild. Aortic Valve: The aortic valve is tricuspid. . There is mild thickening and mild calcification of the aortic valve. Aortic valve regurgitation is not visualized. No aortic stenosis is present. Mild aortic valve annular calcification. There is mild thickening of the aortic valve. There is mild calcification of the aortic valve. Pulmonic Valve: The pulmonic valve was grossly  normal. Pulmonic valve regurgitation is not visualized. Aorta: The aortic root is normal in size and structure. Venous: The inferior vena cava is dilated in size with greater than 50% respiratory variability, suggesting right atrial pressure of 8 mmHg. IAS/Shunts: No atrial level shunt detected by color flow Doppler.  LEFT VENTRICLE PLAX 2D LVIDd:         4.89 cm LVIDs:         3.88 cm LV PW:         1.02 cm LV IVS:        1.18 cm  RIGHT VENTRICLE             IVC RV S prime:     12.10 cm/s  IVC diam: 2.25 cm LEFT ATRIUM             Index       RIGHT ATRIUM           Index LA diam:        5.10 cm 2.48 cm/m  RA Area:     17.10 cm LA Vol (A2C):   65.0 ml 31.55 ml/m RA Volume:   42.20 ml  20.49 ml/m LA Vol (A4C):   87.3 ml 42.38 ml/m LA Biplane Vol: 80.4 ml 39.03 ml/m  AORTIC VALVE LVOT Vmax:   75.20 cm/s LVOT Vmean:  51.400 cm/s LVOT VTI:    0.155 m  AORTA Ao Root diam: 3.20  cm Ao Asc diam:  3.40 cm TRICUSPID VALVE TR Peak grad:   22.7 mmHg TR Vmax:        238.00 cm/s  SHUNTS Systemic VTI: 0.16 m Kate Sable MD Electronically signed by Kate Sable MD Signature Date/Time: 06/10/2019/1:43:49 PM    Final      Assessment and Plan:   1. Abdominal pain, N/V with anemia-Hbg 8.4 2. Afib with RVR on admission now controlled on diltiazem 30 mg q 6. CHADSVASC=3(female/age/HTN) on eliquis but with significant anemia. It sounds like she's had PAF for a long time and LA severely dilated, normal LVEF on echo. Would probably opt for rate control at this point. May need to hold anticoagulation pending GI workup.  3. History of syncope in setting of GI bleed 4 yrs ago 4. Chest pain when has rapid heart rates with multiple CV risk factors-ekg without change. Can do work up as outpatient if symptoms once Afib controlled 5. HTN-untreated-stopped meds in past. 6. HLD-GI intolerance to statins      For questions or updates, please contact [place] Please consult [email or web address] for contact info under     Signed, Ermalinda Barrios, PA-C 06/11/2019 11:05 AM    Attending note:  Patient seen and examined.  I reviewed her records and discussed the case with Ms. Bonnell Public PA-C.  She is currently admitted to the hospital with recent recurring abdominal pain associated with nausea and emesis.  She has been also having chronic lower back pain and in fact was anticipating lumbar spine surgery in early June. She was noted to be in rapid atrial fibrillation at presentation to the ER, newly documented, although she states that she has felt her heart intermittently racing for quite some time now.  She was seen back in 2014 by Dr. Johnsie Cancel for assessment of palpitations, and at that point cardiac monitor showed sinus rhythm with PACs.  Records indicate hospitalization in March for evaluation of hematemesis at which point EGD revealed a large anastomotic ulcer with smaller satellite ulcers,  prior history of gastric bypass surgery and  PUD.  On examination she is in no distress.  Heart rate is in the 80s in atrial fibrillation by telemetry which I personally reviewed.  Systolic blood pressure 123XX123 range.  Lungs are clear.  Cardiac exam reveals irregularly irregular rhythm without gallop.  No peripheral edema.  Pertinent lab work includes potassium 3.8, creatinine 1.28, hemoglobin 8.4, platelets 261, hemoglobin A1c 6.2%, TSH 4.01, SARS coronavirus 2 test negative.  Chest x-ray reports no acute process.  Abdominal and pelvic CT also reports no acute process with postsurgical changes, incidentally noted aortic atherosclerosis.  I reviewed her tracing from May 22 which shows rapid atrial fibrillation in the 140s.  Persistent atrial fibrillation, heart rate better controlled at this point on short acting Cardizem.  We will plan to switch to Cardizem CD 120 mg daily.  CHA2DS2-VASc score is 3, she has been started on Eliquis 5 mg twice daily by primary team.  Echocardiogram reveals LVEF 55 to 60% range and also severe left atrial enlargement.  Heart rate control strategy will be pursued first given lower likelihood of maintaining sinus rhythm in the setting of severe left atrial enlargement.  It remains to be seen whether she will be a good candidate for long-term anticoagulation in light of her history of recurrent GI bleeding, this will need to be followed closely.  GI consultation is pending, if follow-up ECG is needed, Eliquis can be held 24 to 48 hours prior.  Do not anticipate attempted cardioversion in the near future.  Satira Sark, M.D., F.A.C.C.

## 2019-06-11 NOTE — Consult Note (Signed)
Referring Provider: Dr. Carles Collet  Primary Care Physician:  Lemmie Evens, MD Primary Gastroenterologist:  Dr. Gala Romney   Date of Admission: 06/09/19 Date of Consultation: 06/11/19  Reason for Consultation:  Intractable Vomiting  HPI:   74 year old female with remote history of gastric bypass in 2007, history of IDA, anastomotic ulcer in 2016, known small right-sided ventral hernia,  recently inpatient March 2021 with abdominal pain s/p EGD with 2 cm ulcer crater at anastomosis with smaller satellite areas of ulceration in setting of NSAIDs/Goody powders, no bleeding stigmata. Evaluated by Dr. Constance Haw while inpatient and felt ventral hernia did not account for all symptoms. Admitted with abdominal pain, intractable vomiting. Now with newly documented afib with RVR, seen by Cardiology this admission. HR controlled now with cardizem. Started on Eliquis 5 mg BID. Stable from cardiac standpoint after discussion with Dr. Domenic Polite and may hold Eliquis prior to any endoscopic procedure this admission.   Patient has multiple complaints and not consistent with reporting. States she has LLQ pain, lower abdominal pain, RLQ pain, all occurring at different times and pain "moving". Worsened by lifting/pulling/movement. Pain worse than her baseline, associated N/V, prompting ED presentation. Believes she has had this pain for about a year. No weight loss. Chronic back pain, with surgery actually scheduled June 2nd. States abdominal pain worsened by back pain at times. Upon further discussion, states RLQ pain is the worse she is experiencing. Feels it bulging at times.   Notes gagging with water, nausea. Able to eat mashed potatoes and cauliflower with cheese at home, tolerating this the best.  Unable to advance diet this admission. Gagging with breakfast. Nauseated with [place] today.  Did not eat lunch. Clear liquids. Food feels heavy in upper abdomen and like it does not want to pass. Regurgitates liquids at times.  Continues with PPI BID. No NSAIDs since March 2021. Stool dark at times. No constipation. Soft stool.   Hgb in the 8/9 range since March 2021. Ferritin 15 in March 2021. CT this admission without acute findings.      Past Medical History:  Diagnosis Date  . Anemia   . Anxiety   . Arthritis   . Blood transfusion    /w bleeding ulcer & post knee replacement- 2012  . Chronic kidney disease    h/o renal calculi, h/o lithotripsy  . Copper deficiency 06/17/2015  . Depression   . GERD (gastroesophageal reflux disease)    h/o bleeding ulcer, hosp., [facility]  . Headache(784.0)   . Hiatal hernia   . History of kidney stones   . Hypothyroidism   . Morbid obesity ([place])   . Neuralgia    spondylosis & stenosis  . Obsessive-compulsive disorder   . PONV (postoperative nausea and vomiting)   . Recurrent upper respiratory infection (URI)   . Shortness of breath    with exertion  . Sleep apnea    [facility], CPAP not used in 3 months, states she has a new machine  but hasn't used   . Thyroid disease     Past Surgical History:  Procedure Laterality Date  . ABDOMINAL HYSTERECTOMY    . ANTERIOR CERVICAL DECOMP/DISCECTOMY FUSION  12/09/2010   Procedure: ANTERIOR CERVICAL DECOMPRESSION/DISCECTOMY FUSION 3 LEVELS;  Surgeon: Ophelia Charter;  Location: [facility];  Service: Neurosurgery;  Laterality: N/A;  Cervical three-four,Cervical four-five,Cervical Five-Six,Cervical Six-Seven ANTERIOR CERVICAL DECOMPRESSION WITH FUSION INTERBODY PROTHESIS PLATING AND BONEGRAFT  . APPENDECTOMY    . BIOPSY N/A 11/14/2014   Procedure: GASTRIC  BIOPSY;  Surgeon: Daneil Dolin, MD;  Location: [facility];  Service: Endoscopy;  Laterality: N/A;  . CESAREAN SECTION    . CHOLECYSTECTOMY    . COLONOSCOPY WITH PROPOFOL N/A 11/14/2014   RMR: redundant colon but otherwise normal  . ESOPHAGOGASTRODUODENOSCOPY (EGD) WITH PROPOFOL N/A 11/14/2014   RMR"s/p gastric surgery/anastomtic ulcer  .  ESOPHAGOGASTRODUODENOSCOPY (EGD) WITH PROPOFOL N/A 09/15/2017   Procedure: ESOPHAGOGASTRODUODENOSCOPY (EGD) WITH PROPOFOL;  Surgeon: Daneil Dolin, MD;  Location: [facility];  Service: Endoscopy;  Laterality: N/A;  11:30am  . ESOPHAGOGASTRODUODENOSCOPY (EGD) WITH PROPOFOL N/A 04/04/2019   Procedure: ESOPHAGOGASTRODUODENOSCOPY (EGD) WITH PROPOFOL;  Surgeon: Daneil Dolin, MD;  Location: [facility];  Service: Endoscopy;  Laterality: N/A;  . EYE SURGERY     bilateral cataracts removed, w/IOL  . GASTRIC BYPASS  2007  . JOINT REPLACEMENT     07/2010 &  2002- respectively- both knees   . MALONEY DILATION N/A 09/15/2017   Procedure: Venia Minks DILATION;  Surgeon: Daneil Dolin, MD;  Location: [facility];  Service: Endoscopy;  Laterality: N/A;  . PARATHYROIDECTOMY    . PERIPHERALLY INSERTED CENTRAL CATHETER INSERTION     PICC line for treatment of MRSA  . TONSILLECTOMY     as a child    Prior to Admission medications   Medication Sig Start Date End Date Taking? Authorizing Provider  Cyanocobalamin (VITAMIN B-12 IJ) Inject 1 Applicatorful as directed every 30 (thirty) days.    Yes [provider]  levothyroxine (SYNTHROID, LEVOTHROID) 75 MCG tablet Take 75 mcg by mouth daily before breakfast.   Yes [provider]  pantoprazole (PROTONIX) 40 MG tablet Take 1 tablet (40 mg total) by mouth 2 (two) times daily before a meal. 04/05/19 06/09/19 Yes Shah, Pratik D, DO  venlafaxine (EFFEXOR) 75 MG tablet Take 75 mg by mouth 2 (two) times daily.    Yes [provider]  Vitamin D, Ergocalciferol, (DRISDOL) 50000 units CAPS capsule Take 50,000 Units by mouth every 7 (seven) days. Fridays   Yes [provider]  diclofenac Sodium (VOLTAREN) 1 % GEL Apply 2 g topically 4 (four) times daily.  02/19/19   [provider]  furosemide (LASIX) 40 MG tablet Take 40 mg by mouth as needed.    [provider]  ondansetron (ZOFRAN) 4 MG tablet Take 1 tablet (4 mg  total) by mouth every 8 (eight) hours as needed for nausea or vomiting. Patient not taking: Reported on 06/09/2019 05/28/19   Mahala Menghini, PA-C  oxyCODONE-acetaminophen (PERCOCET) 5-325 MG tablet Take 1-2 tablets by mouth every 8 (eight) hours as needed for severe pain. Patient not taking: Reported on 06/09/2019 04/05/19   Heath Lark D, DO  polyethylene glycol (MIRALAX / GLYCOLAX) 17 g packet Take 17 g by mouth daily as needed for mild constipation. Patient not taking: Reported on 06/09/2019 04/05/19   Heath Lark D, DO  sucralfate (CARAFATE) 1 GM/10ML suspension Take 10 mLs (1 g total) by mouth 4 (four) times daily -  with meals and at bedtime for 10 days. 04/05/19 04/15/19  Manuella Ghazi, Pratik D, DO  traMADol Veatrice Bourbon) 50 MG tablet SMARTSIG:1 Tablet(s) By Mouth 1 to 2 Times Daily 03/21/19   [provider]    Current Facility-Administered Medications  Medication Dose Route Frequency Provider Last Rate Last Admin  . acetaminophen (TYLENOL) tablet 650 mg  650 mg Oral Q6H PRN Orson Eva, MD   650 mg at 06/10/19 0853   Or  . acetaminophen (TYLENOL)  suppository 650 mg  650 mg Rectal Q6H PRN Tat, Shanon Brow, MD      . apixaban Arne Cleveland) tablet 5 mg  5 mg Oral BID Orson Eva, MD   5 mg at 06/10/19 2107  . Chlorhexidine Gluconate Cloth 2 % PADS 6 each  6 each Topical Daily Tat, David, MD   6 each at 06/10/19 0848  . diltiazem (CARDIZEM) tablet 30 mg  30 mg Oral Q6H Orson Eva, MD   30 mg at 06/11/19 0527  . fentaNYL (SUBLIMAZE) injection 50 mcg  50 mcg Intravenous Once Bunnie Pion Z, DO      . levothyroxine (SYNTHROID) tablet 75 mcg  75 mcg Oral QAC breakfast Orson Eva, MD   75 mcg at 06/11/19 0527  . melatonin tablet 3 mg  3 mg Oral QHS PRN Lovey Newcomer T, NP   3 mg at 06/10/19 2107  . ondansetron (ZOFRAN) tablet 4 mg  4 mg Oral Q6H PRN Tat, David, MD   4 mg at 06/10/19 1142   Or  . ondansetron (ZOFRAN) injection 4 mg  4 mg Intravenous Q6H PRN Orson Eva, MD   4 mg at 06/10/19 2048  . pantoprazole  (PROTONIX) EC tablet 40 mg  40 mg Oral BID Mina Marble, MD   40 mg at 06/10/19 1721  . venlafaxine (EFFEXOR) tablet 75 mg  75 mg Oral BID Orson Eva, MD   75 mg at 06/10/19 2107    Allergies as of 06/09/2019 - Review Complete 06/09/2019  Allergen Reaction Noted  . Benzodiazepines Other (See Comments) 12/03/2011  . Codeine Nausea Only   . Escitalopram oxalate Nausea Only 12/02/2010  . Nsaids Other (See Comments) 12/22/2012  . Sertraline hcl Nausea Only   . Statins Nausea And Vomiting   . Tramadol hcl Other (See Comments) 12/22/2012    Family History  Problem Relation Age of Onset  . Anxiety disorder Mother   . Anxiety disorder Sister   . Bipolar disorder Daughter   . OCD Daughter   . ADD / ADHD Daughter   . Heart attack Father   . Anesthesia problems Neg Hx   . Hypotension Neg Hx   . Malignant hyperthermia Neg Hx   . Pseudochol deficiency Neg Hx   . Dementia Neg Hx   . Alcohol abuse Neg Hx   . Drug abuse Neg Hx   . Depression Neg Hx   . Paranoid behavior Neg Hx   . Schizophrenia Neg Hx   . Seizures Neg Hx   . Sexual abuse Neg Hx   . Physical abuse Neg Hx   . Colon cancer Neg Hx     Social History   Socioeconomic History  . Marital status: Widowed    Spouse name: Not on file  . Number of children: Not on file  . Years of education: Not on file  . Highest education level: Not on file  Occupational History  . Not on file  Tobacco Use  . Smoking status: Former Smoker    Packs/day: 1.00    Years: 30.00    Pack years: 30.00    Types: Cigarettes    Quit date: 01/18/1998    Years since quitting: 21.4  . Smokeless tobacco: Never Used  Substance and Sexual Activity  . Alcohol use: No  . Drug use: No  . Sexual activity: Not Currently    Birth control/protection: Surgical  Other Topics Concern  . Not on file  Social History Narrative  . Not on file  Social Determinants of Health   Financial Resource Strain:   . Difficulty of Paying Living Expenses:   Food  Insecurity:   . Worried About Charity fundraiser in the Last Year:   . Arboriculturist in the Last Year:   Transportation Needs:   . Film/video editor (Medical):   [name] Lack of Transportation (Non-Medical):   Physical Activity:   . Days of Exercise per Week:   . Minutes of Exercise per Session:   Stress:   . Feeling of Stress :   Social Connections:   . Frequency of Communication with Friends and Family:   . Frequency of Social Gatherings with Friends and Family:   . Attends Religious Services:   . Active Member of Clubs or Organizations:   . Attends Archivist Meetings:   [name] Marital Status:   Intimate Partner Violence:   . Fear of Current or Ex-Partner:   . Emotionally Abused:   [name] Physically Abused:   . Sexually Abused:     Review of Systems: Gen: see HPI CV: see HPI Resp: Denies shortness of breath with rest, cough, wheezing GI: see HPI GU : Denies urinary burning, urinary frequency, urinary incontinence.  MS: Denies joint pain,swelling, cramping Derm: Denies rash, itching, dry skin Psych: Denies depression, anxiety,confusion, or memory loss Heme: see HPI  Physical Exam: Vital signs in last 24 hours: Temp:  [98.1 F (36.7 C)-98.8 F (37.1 C)] 98.3 F (36.8 C) (05/24 0606) Pulse Rate:  [75-106] 88 (05/24 0606) Resp:  [13-24] 20 (05/24 0606) BP: (94-137)/(52-102) 116/75 (05/24 0606) SpO2:  [93 %-100 %] 96 % (05/24 0820) Last BM Date: 06/10/19  Wt 220 General:   Alert,  Well-developed, well-nourished, pleasant and cooperative in NAD Head:  Normocephalic and atraumatic. Eyes:  Sclera clear, no icterus.   Conjunctiva pink. Ears:  Normal auditory acuity. Nose:  No deformity, discharge,  or lesions. Mouth:  No deformity or lesions Lungs:  Clear throughout to auscultation.    Heart:  S1 S2 irregularly irregular, possible soft murmur Abdomen:  Soft, obese, non-distended. TTP multiple sites including point tenderness RLQ, pointing to possible site of  ventral hernia and appears more pronounced than left-sided abdomen, +epigastric tenderness mild.  Normal bowel sounds.  Rectal:  Deferred  Msk:  Symmetrical without gross deformities. Normal posture. Extremities:  Without edema. Neurologic:  Alert and  oriented x4 Psych:  Alert and cooperative. Normal mood and affect.  Intake/Output from previous day: 05/23 0701 - 05/24 0700 In: 10.1 [I.V.:10.1] Out: 250 [Urine:250] Intake/Output this shift: No intake/output data recorded.  Lab Results: Recent Labs    06/09/19 1115 06/10/19 0011 06/11/19 0523  WBC 7.4 6.8 6.1  HGB 9.1* 8.3* 8.4*  HCT 31.2* 28.1* 29.6*  PLT 292 266 261   BMET Recent Labs    06/09/19 1115 06/10/19 0011 06/11/19 0523  NA 140 142 137  K 3.5 3.6 3.8  CL 107 109 103  CO2 24 24 24   GLUCOSE 138* 123* 95  BUN 20 18 21   CREATININE 1.25* 1.07* 1.28*  CALCIUM 8.9 8.8* 9.2   LFT Recent Labs    06/09/19 1115  PROT 6.5  ALBUMIN 3.8  AST 16  ALT 9  ALKPHOS 61  BILITOT 0.7    Studies/Results: CT Abdomen Pelvis W Contrast  Result Date: 06/09/2019 CLINICAL DATA:  Generalized abdominal pain for several hours EXAM: CT ABDOMEN AND PELVIS WITH CONTRAST TECHNIQUE: Multidetector CT imaging of the abdomen and pelvis was performed using  the standard protocol following bolus administration of intravenous contrast. CONTRAST:  37mL OMNIPAQUE IOHEXOL 300 MG/ML  SOLN COMPARISON:  None. FINDINGS: Lower chest: No acute abnormality. Hepatobiliary: No focal liver abnormality is seen. Status post cholecystectomy. No biliary dilatation. Pancreas: Unremarkable. No pancreatic ductal dilatation or surrounding inflammatory changes. Spleen: Normal in size without focal abnormality. Adrenals/Urinary Tract: Adrenal glands are within normal limits. Kidneys demonstrate a normal enhancement pattern. No obstructive changes are seen. No renal calculi are noted. The bladder is well distended. Stomach/Bowel: Appendix has been surgically removed.  No obstructive or inflammatory changes of the colon or small bowel are seen. Postsurgical changes in the stomach are noted consistent with gastric bypass. Vascular/Lymphatic: Aortic atherosclerosis. No enlarged abdominal or pelvic lymph nodes. Reproductive: Status post hysterectomy. No adnexal masses. Other: No abdominal wall hernia or abnormality. No abdominopelvic ascites. Musculoskeletal: Degenerative changes of lumbar spine are noted. Postsurgical changes are seen at L4-5. IMPRESSION: Postsurgical changes without acute abnormality. Electronically Signed   By: Inez Catalina M.D.   On: 06/09/2019 14:05   DG Chest Port 1 View  Result Date: 06/09/2019 CLINICAL DATA:  74 year old female with a history of dyspnea EXAM: PORTABLE CHEST 1 VIEW COMPARISON:  04/20/2012 FINDINGS: The cardiac border appears slightly more prominent than the most recent comparison, potentially accentuated by apical lordotic positioning. Calcifications of the aortic arch. No pneumothorax or pleural effusion. No confluent airspace disease. Coarsened interstitial markings similar to the prior. No displaced fracture. Surgical changes of the cervical region. IMPRESSION: Negative for acute cardiopulmonary disease. Electronically Signed   By: Corrie Mckusick D.O.   On: 06/09/2019 12:04   ECHOCARDIOGRAM COMPLETE  Result Date: 06/10/2019    ECHOCARDIOGRAM REPORT   Patient Name:   NATALLIE FILL Date of Exam: 06/10/2019 Medical Rec #:  [id number]    Height:       65.0 in Accession #:    [id number]   Weight:       220.0 lb Date of Birth:  May 15, 1945    BSA:          2.060 m Patient Age:    62 years     BP:           113/73 mmHg Patient Gender: F            HR:           83 bpm. Exam Location:  [facility] Procedure: 2D Echo Indications:    atrial fibrillation  History:        Patient has prior history of Echocardiogram examinations, most                 recent 05/25/2012.  Sonographer:    Johny Chess RDCS Referring Phys: [id number] DAVID TAT IMPRESSIONS   1. Left ventricular ejection fraction, by estimation, is 55 to 60%. The left ventricle has normal function. The left ventricle has no regional wall motion abnormalities. There is mild concentric left ventricular hypertrophy. Left ventricular diastolic parameters are indeterminate.  2. Right ventricular systolic function is normal. The right ventricular size is normal. There is normal pulmonary artery systolic pressure.  3. Left atrial size was severely dilated.  4. Right atrial size was mildly dilated.  5. The mitral valve is degenerative. Mild mitral valve regurgitation.  6. The aortic valve is tricuspid. Aortic valve regurgitation is not visualized. No aortic stenosis is present.  7. The inferior vena cava is dilated in size with >50% respiratory variability, suggesting right atrial pressure of 8 mmHg. FINDINGS  Left Ventricle: Left ventricular ejection fraction, by estimation, is 55 to 60%. The left ventricle has normal function. The left ventricle has no regional wall motion abnormalities. The left ventricular internal cavity size was normal in size. There is  mild concentric left ventricular hypertrophy. Left ventricular diastolic parameters are indeterminate. Right Ventricle: The right ventricular size is normal. No increase in right ventricular wall thickness. Right ventricular systolic function is normal. There is normal pulmonary artery systolic pressure. The tricuspid regurgitant velocity is 2.38 m/s, and  with an assumed right atrial pressure of 8 mmHg, the estimated right ventricular systolic pressure is 99991111 mmHg. Left Atrium: Left atrial size was severely dilated. Right Atrium: Right atrial size was mildly dilated. Pericardium: There is no evidence of pericardial effusion. Mitral Valve: The mitral valve is degenerative in appearance. There is mild thickening of the mitral valve leaflet(s). Mild mitral annular calcification. Mild mitral valve regurgitation. Tricuspid Valve: The tricuspid valve is  grossly normal. Tricuspid valve regurgitation is mild. Aortic Valve: The aortic valve is tricuspid. . There is mild thickening and mild calcification of the aortic valve. Aortic valve regurgitation is not visualized. No aortic stenosis is present. Mild aortic valve annular calcification. There is mild thickening of the aortic valve. There is mild calcification of the aortic valve. Pulmonic Valve: The pulmonic valve was grossly normal. Pulmonic valve regurgitation is not visualized. Aorta: The aortic root is normal in size and structure. Venous: The inferior vena cava is dilated in size with greater than 50% respiratory variability, suggesting right atrial pressure of 8 mmHg. IAS/Shunts: No atrial level shunt detected by color flow Doppler.  LEFT VENTRICLE PLAX 2D LVIDd:         4.89 cm LVIDs:         3.88 cm LV PW:         1.02 cm LV IVS:        1.18 cm  RIGHT VENTRICLE             IVC RV S prime:     12.10 cm/s  IVC diam: 2.25 cm LEFT ATRIUM             Index       RIGHT ATRIUM           Index LA diam:        5.10 cm 2.48 cm/m  RA Area:     17.10 cm LA Vol (A2C):   65.0 ml 31.55 ml/m RA Volume:   42.20 ml  20.49 ml/m LA Vol (A4C):   87.3 ml 42.38 ml/m LA Biplane Vol: 80.4 ml 39.03 ml/m  AORTIC VALVE LVOT Vmax:   75.20 cm/s LVOT Vmean:  51.400 cm/s LVOT VTI:    0.155 m  AORTA Ao Root diam: 3.20 cm Ao Asc diam:  3.40 cm TRICUSPID VALVE TR Peak grad:   22.7 mmHg TR Vmax:        238.00 cm/s  SHUNTS Systemic VTI: 0.16 m Kate Sable MD Electronically signed by Kate Sable MD Signature Date/Time: 06/10/2019/1:43:49 PM    Final     Impression: 74 year old female with remote history of gastric bypass in 2007, history of IDA, anastomotic ulcer in 2016, known right-sided lateral ventral hernia,  recently inpatient March 2021 with abdominal pain s/p EGD with 2 cm ulcer crater at anastomosis with smaller satellite areas of ulceration in setting of NSAIDs/Goody powders, no bleeding stigmata. Evaluated by Dr.  Constance Haw while inpatient March 2021 and felt ventral hernia did not account for all  symptoms. Admitted with abdominal pain, intractable vomiting, newly documented afib with RVR now on Eliquis.  Abdominal pain: with associated N/V. From description, she has multiple sites for abdominal pain and states RLQ is predominant concern, worsened with movement; she also notes flares of chronic back pain contribute to abdominal pain, and was actually scheduled for surgery June 2nd. Known lateral ventral hernia that was evaluated in March 2021 while inpatient. Doubt dealing with an internal hernia, s/p gastric bypass, as she does not have typical symptoms such as weight loss, periumbilical abdominal pain, significant postprandial pain etc. Pain in multiple sites. Known anastomotic ulcer likely contributing to N/V, regurgitation. Interestingly, she states mashed potatoes and cauliflower with cheese are tolerated at home. Due for surveillance EGD mid June 2021. May need to pursue EGD while inpatient due to persistent reports of regurgitation/vomiting.   Afib with RVR: controlled. Cardiology on board. Clear from cardiac standpoint for endoscopic procedure if necessary. Would need to hold Eliquis prior.   She would benefit from outpatient follow-up with bariatric surgeon.  Plan: Continue PPI BID Continue clear liquid diet Received morning dose of Eliquis: would need to hold prior to EGD Will review CT from 5/22 with radiology Further recommendations to follow   Annitta Needs, PhD, ANP-BC [facility]      LOS: 1 day    06/11/2019, 8:24 AM

## 2019-06-11 NOTE — H&P (View-Only) (Signed)
Referring Provider: Dr. Carles Collet  Primary Care Physician:  Lemmie Evens, MD Primary Gastroenterologist:  Dr. Gala Romney   Date of Admission: 06/09/19 Date of Consultation: 06/11/19  Reason for Consultation:  Intractable Vomiting  HPI:   74 year old female with remote history of gastric bypass in 2007, history of IDA, anastomotic ulcer in 2016, known small right-sided ventral hernia,  recently inpatient March 2021 with abdominal pain s/p EGD with 2 cm ulcer crater at anastomosis with smaller satellite areas of ulceration in setting of NSAIDs/Goody powders, no bleeding stigmata. Evaluated by Dr. Constance Haw while inpatient and felt ventral hernia did not account for all symptoms. Admitted with abdominal pain, intractable vomiting. Now with newly documented afib with RVR, seen by Cardiology this admission. HR controlled now with cardizem. Started on Eliquis 5 mg BID. Stable from cardiac standpoint after discussion with Dr. Domenic Polite and may hold Eliquis prior to any endoscopic procedure this admission.   Patient has multiple complaints and not consistent with reporting. States she has LLQ pain, lower abdominal pain, RLQ pain, all occurring at different times and pain "moving". Worsened by lifting/pulling/movement. Pain worse than her baseline, associated N/V, prompting ED presentation. Believes she has had this pain for about a year. No weight loss. Chronic back pain, with surgery actually scheduled June 2nd. States abdominal pain worsened by back pain at times. Upon further discussion, states RLQ pain is the worse she is experiencing. Feels it bulging at times.   Notes gagging with water, nausea. Able to eat mashed potatoes and cauliflower with cheese at home, tolerating this the best.  Unable to advance diet this admission. Gagging with breakfast. Nauseated with [place] today.  Did not eat lunch. Clear liquids. Food feels heavy in upper abdomen and like it does not want to pass. Regurgitates liquids at times.  Continues with PPI BID. No NSAIDs since March 2021. Stool dark at times. No constipation. Soft stool.   Hgb in the 8/9 range since March 2021. Ferritin 15 in March 2021. CT this admission without acute findings.      Past Medical History:  Diagnosis Date  . Anemia   . Anxiety   . Arthritis   . Blood transfusion    /w bleeding ulcer & post knee replacement- 2012  . Chronic kidney disease    h/o renal calculi, h/o lithotripsy  . Copper deficiency 06/17/2015  . Depression   . GERD (gastroesophageal reflux disease)    h/o bleeding ulcer, hosp., [facility]  . Headache(784.0)   . Hiatal hernia   . History of kidney stones   . Hypothyroidism   . Morbid obesity ([place])   . Neuralgia    spondylosis & stenosis  . Obsessive-compulsive disorder   . PONV (postoperative nausea and vomiting)   . Recurrent upper respiratory infection (URI)   . Shortness of breath    with exertion  . Sleep apnea    [facility], CPAP not used in 3 months, states she has a new machine  but hasn't used   . Thyroid disease     Past Surgical History:  Procedure Laterality Date  . ABDOMINAL HYSTERECTOMY    . ANTERIOR CERVICAL DECOMP/DISCECTOMY FUSION  12/09/2010   Procedure: ANTERIOR CERVICAL DECOMPRESSION/DISCECTOMY FUSION 3 LEVELS;  Surgeon: Ophelia Charter;  Location: [place] [facility];  Service: Neurosurgery;  Laterality: N/A;  Cervical three-four,Cervical four-five,Cervical Five-Six,Cervical Six-Seven ANTERIOR CERVICAL DECOMPRESSION WITH FUSION INTERBODY PROTHESIS PLATING AND BONEGRAFT  . APPENDECTOMY    . BIOPSY N/A 11/14/2014   Procedure: GASTRIC  BIOPSY;  Surgeon: Daneil Dolin, MD;  Location: [facility];  Service: Endoscopy;  Laterality: N/A;  . CESAREAN SECTION    . CHOLECYSTECTOMY    . COLONOSCOPY WITH PROPOFOL N/A 11/14/2014   RMR: redundant colon but otherwise normal  . ESOPHAGOGASTRODUODENOSCOPY (EGD) WITH PROPOFOL N/A 11/14/2014   RMR"s/p gastric surgery/anastomtic ulcer  .  ESOPHAGOGASTRODUODENOSCOPY (EGD) WITH PROPOFOL N/A 09/15/2017   Procedure: ESOPHAGOGASTRODUODENOSCOPY (EGD) WITH PROPOFOL;  Surgeon: Daneil Dolin, MD;  Location: [facility];  Service: Endoscopy;  Laterality: N/A;  11:30am  . ESOPHAGOGASTRODUODENOSCOPY (EGD) WITH PROPOFOL N/A 04/04/2019   Procedure: ESOPHAGOGASTRODUODENOSCOPY (EGD) WITH PROPOFOL;  Surgeon: Daneil Dolin, MD;  Location: [facility];  Service: Endoscopy;  Laterality: N/A;  . EYE SURGERY     bilateral cataracts removed, w/IOL  . GASTRIC BYPASS  2007  . JOINT REPLACEMENT     07/2010 &  2002- respectively- both knees   . MALONEY DILATION N/A 09/15/2017   Procedure: Venia Minks DILATION;  Surgeon: Daneil Dolin, MD;  Location: [facility];  Service: Endoscopy;  Laterality: N/A;  . PARATHYROIDECTOMY    . PERIPHERALLY INSERTED CENTRAL CATHETER INSERTION     PICC line for treatment of MRSA  . TONSILLECTOMY     as a child    Prior to Admission medications   Medication Sig Start Date End Date Taking? Authorizing Provider  Cyanocobalamin (VITAMIN B-12 IJ) Inject 1 Applicatorful as directed every 30 (thirty) days.    Yes [provider]  levothyroxine (SYNTHROID, LEVOTHROID) 75 MCG tablet Take 75 mcg by mouth daily before breakfast.   Yes [provider]  pantoprazole (PROTONIX) 40 MG tablet Take 1 tablet (40 mg total) by mouth 2 (two) times daily before a meal. 04/05/19 06/09/19 Yes Shah, Pratik D, DO  venlafaxine (EFFEXOR) 75 MG tablet Take 75 mg by mouth 2 (two) times daily.    Yes [provider]  Vitamin D, Ergocalciferol, (DRISDOL) 50000 units CAPS capsule Take 50,000 Units by mouth every 7 (seven) days. Fridays   Yes [provider]  diclofenac Sodium (VOLTAREN) 1 % GEL Apply 2 g topically 4 (four) times daily.  02/19/19   [provider]  furosemide (LASIX) 40 MG tablet Take 40 mg by mouth as needed.    [provider]  ondansetron (ZOFRAN) 4 MG tablet Take 1 tablet (4 mg  total) by mouth every 8 (eight) hours as needed for nausea or vomiting. Patient not taking: Reported on 06/09/2019 05/28/19   Mahala Menghini, PA-C  oxyCODONE-acetaminophen (PERCOCET) 5-325 MG tablet Take 1-2 tablets by mouth every 8 (eight) hours as needed for severe pain. Patient not taking: Reported on 06/09/2019 04/05/19   Heath Lark D, DO  polyethylene glycol (MIRALAX / GLYCOLAX) 17 g packet Take 17 g by mouth daily as needed for mild constipation. Patient not taking: Reported on 06/09/2019 04/05/19   Heath Lark D, DO  sucralfate (CARAFATE) 1 GM/10ML suspension Take 10 mLs (1 g total) by mouth 4 (four) times daily -  with meals and at bedtime for 10 days. 04/05/19 04/15/19  Manuella Ghazi, Pratik D, DO  traMADol Veatrice Bourbon) 50 MG tablet SMARTSIG:1 Tablet(s) By Mouth 1 to 2 Times Daily 03/21/19   [provider]    Current Facility-Administered Medications  Medication Dose Route Frequency Provider Last Rate Last Admin  . acetaminophen (TYLENOL) tablet 650 mg  650 mg Oral Q6H PRN Orson Eva, MD   650 mg at 06/10/19 0853   Or  . acetaminophen (TYLENOL)  suppository 650 mg  650 mg Rectal Q6H PRN Tat, Shanon Brow, MD      . apixaban Arne Cleveland) tablet 5 mg  5 mg Oral BID Orson Eva, MD   5 mg at 06/10/19 2107  . Chlorhexidine Gluconate Cloth 2 % PADS 6 each  6 each Topical Daily Tat, David, MD   6 each at 06/10/19 0848  . diltiazem (CARDIZEM) tablet 30 mg  30 mg Oral Q6H Orson Eva, MD   30 mg at 06/11/19 0527  . fentaNYL (SUBLIMAZE) injection 50 mcg  50 mcg Intravenous Once Bunnie Pion Z, DO      . levothyroxine (SYNTHROID) tablet 75 mcg  75 mcg Oral QAC breakfast Orson Eva, MD   75 mcg at 06/11/19 0527  . melatonin tablet 3 mg  3 mg Oral QHS PRN Lovey Newcomer T, NP   3 mg at 06/10/19 2107  . ondansetron (ZOFRAN) tablet 4 mg  4 mg Oral Q6H PRN Tat, David, MD   4 mg at 06/10/19 1142   Or  . ondansetron (ZOFRAN) injection 4 mg  4 mg Intravenous Q6H PRN Orson Eva, MD   4 mg at 06/10/19 2048  . pantoprazole  (PROTONIX) EC tablet 40 mg  40 mg Oral BID Mina Marble, MD   40 mg at 06/10/19 1721  . venlafaxine (EFFEXOR) tablet 75 mg  75 mg Oral BID Orson Eva, MD   75 mg at 06/10/19 2107    Allergies as of 06/09/2019 - Review Complete 06/09/2019  Allergen Reaction Noted  . Benzodiazepines Other (See Comments) 12/03/2011  . Codeine Nausea Only   . Escitalopram oxalate Nausea Only 12/02/2010  . Nsaids Other (See Comments) 12/22/2012  . Sertraline hcl Nausea Only   . Statins Nausea And Vomiting   . Tramadol hcl Other (See Comments) 12/22/2012    Family History  Problem Relation Age of Onset  . Anxiety disorder Mother   . Anxiety disorder Sister   . Bipolar disorder Daughter   . OCD Daughter   . ADD / ADHD Daughter   . Heart attack Father   . Anesthesia problems Neg Hx   . Hypotension Neg Hx   . Malignant hyperthermia Neg Hx   . Pseudochol deficiency Neg Hx   . Dementia Neg Hx   . Alcohol abuse Neg Hx   . Drug abuse Neg Hx   . Depression Neg Hx   . Paranoid behavior Neg Hx   . Schizophrenia Neg Hx   . Seizures Neg Hx   . Sexual abuse Neg Hx   . Physical abuse Neg Hx   . Colon cancer Neg Hx     Social History   Socioeconomic History  . Marital status: Widowed    Spouse name: Not on file  . Number of children: Not on file  . Years of education: Not on file  . Highest education level: Not on file  Occupational History  . Not on file  Tobacco Use  . Smoking status: Former Smoker    Packs/day: 1.00    Years: 30.00    Pack years: 30.00    Types: Cigarettes    Quit date: 01/18/1998    Years since quitting: 21.4  . Smokeless tobacco: Never Used  Substance and Sexual Activity  . Alcohol use: No  . Drug use: No  . Sexual activity: Not Currently    Birth control/protection: Surgical  Other Topics Concern  . Not on file  Social History Narrative  . Not on file  Social Determinants of Health   Financial Resource Strain:   . Difficulty of Paying Living Expenses:   Food  Insecurity:   . Worried About Charity fundraiser in the Last Year:   . Arboriculturist in the Last Year:   Transportation Needs:   . Film/video editor (Medical):   [name] Lack of Transportation (Non-Medical):   Physical Activity:   . Days of Exercise per Week:   . Minutes of Exercise per Session:   Stress:   . Feeling of Stress :   Social Connections:   . Frequency of Communication with Friends and Family:   . Frequency of Social Gatherings with Friends and Family:   . Attends Religious Services:   . Active Member of Clubs or Organizations:   . Attends Archivist Meetings:   [name] Marital Status:   Intimate Partner Violence:   . Fear of Current or Ex-Partner:   . Emotionally Abused:   [name] Physically Abused:   . Sexually Abused:     Review of Systems: Gen: see HPI CV: see HPI Resp: Denies shortness of breath with rest, cough, wheezing GI: see HPI GU : Denies urinary burning, urinary frequency, urinary incontinence.  MS: Denies joint pain,swelling, cramping Derm: Denies rash, itching, dry skin Psych: Denies depression, anxiety,confusion, or memory loss Heme: see HPI  Physical Exam: Vital signs in last 24 hours: Temp:  [98.1 F (36.7 C)-98.8 F (37.1 C)] 98.3 F (36.8 C) (05/24 0606) Pulse Rate:  [75-106] 88 (05/24 0606) Resp:  [13-24] 20 (05/24 0606) BP: (94-137)/(52-102) 116/75 (05/24 0606) SpO2:  [93 %-100 %] 96 % (05/24 0820) Last BM Date: 06/10/19  Wt 220 General:   Alert,  Well-developed, well-nourished, pleasant and cooperative in NAD Head:  Normocephalic and atraumatic. Eyes:  Sclera clear, no icterus.   Conjunctiva pink. Ears:  Normal auditory acuity. Nose:  No deformity, discharge,  or lesions. Mouth:  No deformity or lesions Lungs:  Clear throughout to auscultation.    Heart:  S1 S2 irregularly irregular, possible soft murmur Abdomen:  Soft, obese, non-distended. TTP multiple sites including point tenderness RLQ, pointing to possible site of  ventral hernia and appears more pronounced than left-sided abdomen, +epigastric tenderness mild.  Normal bowel sounds.  Rectal:  Deferred  Msk:  Symmetrical without gross deformities. Normal posture. Extremities:  Without edema. Neurologic:  Alert and  oriented x4 Psych:  Alert and cooperative. Normal mood and affect.  Intake/Output from previous day: 05/23 0701 - 05/24 0700 In: 10.1 [I.V.:10.1] Out: 250 [Urine:250] Intake/Output this shift: No intake/output data recorded.  Lab Results: Recent Labs    06/09/19 1115 06/10/19 0011 06/11/19 0523  WBC 7.4 6.8 6.1  HGB 9.1* 8.3* 8.4*  HCT 31.2* 28.1* 29.6*  PLT 292 266 261   BMET Recent Labs    06/09/19 1115 06/10/19 0011 06/11/19 0523  NA 140 142 137  K 3.5 3.6 3.8  CL 107 109 103  CO2 24 24 24   GLUCOSE 138* 123* 95  BUN 20 18 21   CREATININE 1.25* 1.07* 1.28*  CALCIUM 8.9 8.8* 9.2   LFT Recent Labs    06/09/19 1115  PROT 6.5  ALBUMIN 3.8  AST 16  ALT 9  ALKPHOS 61  BILITOT 0.7    Studies/Results: CT Abdomen Pelvis W Contrast  Result Date: 06/09/2019 CLINICAL DATA:  Generalized abdominal pain for several hours EXAM: CT ABDOMEN AND PELVIS WITH CONTRAST TECHNIQUE: Multidetector CT imaging of the abdomen and pelvis was performed using  the standard protocol following bolus administration of intravenous contrast. CONTRAST:  77mL OMNIPAQUE IOHEXOL 300 MG/ML  SOLN COMPARISON:  None. FINDINGS: Lower chest: No acute abnormality. Hepatobiliary: No focal liver abnormality is seen. Status post cholecystectomy. No biliary dilatation. Pancreas: Unremarkable. No pancreatic ductal dilatation or surrounding inflammatory changes. Spleen: Normal in size without focal abnormality. Adrenals/Urinary Tract: Adrenal glands are within normal limits. Kidneys demonstrate a normal enhancement pattern. No obstructive changes are seen. No renal calculi are noted. The bladder is well distended. Stomach/Bowel: Appendix has been surgically removed.  No obstructive or inflammatory changes of the colon or small bowel are seen. Postsurgical changes in the stomach are noted consistent with gastric bypass. Vascular/Lymphatic: Aortic atherosclerosis. No enlarged abdominal or pelvic lymph nodes. Reproductive: Status post hysterectomy. No adnexal masses. Other: No abdominal wall hernia or abnormality. No abdominopelvic ascites. Musculoskeletal: Degenerative changes of lumbar spine are noted. Postsurgical changes are seen at L4-5. IMPRESSION: Postsurgical changes without acute abnormality. Electronically Signed   By: Inez Catalina M.D.   On: 06/09/2019 14:05   DG Chest Port 1 View  Result Date: 06/09/2019 CLINICAL DATA:  74 year old female with a history of dyspnea EXAM: PORTABLE CHEST 1 VIEW COMPARISON:  04/20/2012 FINDINGS: The cardiac border appears slightly more prominent than the most recent comparison, potentially accentuated by apical lordotic positioning. Calcifications of the aortic arch. No pneumothorax or pleural effusion. No confluent airspace disease. Coarsened interstitial markings similar to the prior. No displaced fracture. Surgical changes of the cervical region. IMPRESSION: Negative for acute cardiopulmonary disease. Electronically Signed   By: Corrie Mckusick D.O.   On: 06/09/2019 12:04   ECHOCARDIOGRAM COMPLETE  Result Date: 06/10/2019    ECHOCARDIOGRAM REPORT   Patient Name:   [name] Date of Exam: 06/10/2019 Medical Rec #:  [id number]    Height:       65.0 in Accession #:    [id number]   Weight:       220.0 lb Date of Birth:  [birth year]    BSA:          2.060 m Patient Age:    20 years     BP:           113/73 mmHg Patient Gender: F            HR:           83 bpm. Exam Location:  [facility] Procedure: 2D Echo Indications:    atrial fibrillation  History:        Patient has prior history of Echocardiogram examinations, most                 recent 05/25/2012.  Sonographer:    Johny Chess RDCS Referring Phys: [phone] DAVID TAT IMPRESSIONS   1. Left ventricular ejection fraction, by estimation, is 55 to 60%. The left ventricle has normal function. The left ventricle has no regional wall motion abnormalities. There is mild concentric left ventricular hypertrophy. Left ventricular diastolic parameters are indeterminate.  2. Right ventricular systolic function is normal. The right ventricular size is normal. There is normal pulmonary artery systolic pressure.  3. Left atrial size was severely dilated.  4. Right atrial size was mildly dilated.  5. The mitral valve is degenerative. Mild mitral valve regurgitation.  6. The aortic valve is tricuspid. Aortic valve regurgitation is not visualized. No aortic stenosis is present.  7. The inferior vena cava is dilated in size with >50% respiratory variability, suggesting right atrial pressure of 8 mmHg. FINDINGS  Left Ventricle: Left ventricular ejection fraction, by estimation, is 55 to 60%. The left ventricle has normal function. The left ventricle has no regional wall motion abnormalities. The left ventricular internal cavity size was normal in size. There is  mild concentric left ventricular hypertrophy. Left ventricular diastolic parameters are indeterminate. Right Ventricle: The right ventricular size is normal. No increase in right ventricular wall thickness. Right ventricular systolic function is normal. There is normal pulmonary artery systolic pressure. The tricuspid regurgitant velocity is 2.38 m/s, and  with an assumed right atrial pressure of 8 mmHg, the estimated right ventricular systolic pressure is [id number] mmHg. Left Atrium: Left atrial size was severely dilated. Right Atrium: Right atrial size was mildly dilated. Pericardium: There is no evidence of pericardial effusion. Mitral Valve: The mitral valve is degenerative in appearance. There is mild thickening of the mitral valve leaflet(s). Mild mitral annular calcification. Mild mitral valve regurgitation. Tricuspid Valve: The tricuspid valve is  grossly normal. Tricuspid valve regurgitation is mild. Aortic Valve: The aortic valve is tricuspid. . There is mild thickening and mild calcification of the aortic valve. Aortic valve regurgitation is not visualized. No aortic stenosis is present. Mild aortic valve annular calcification. There is mild thickening of the aortic valve. There is mild calcification of the aortic valve. Pulmonic Valve: The pulmonic valve was grossly normal. Pulmonic valve regurgitation is not visualized. Aorta: The aortic root is normal in size and structure. Venous: The inferior vena cava is dilated in size with greater than 50% respiratory variability, suggesting right atrial pressure of 8 mmHg. IAS/Shunts: No atrial level shunt detected by color flow Doppler.  LEFT VENTRICLE PLAX 2D LVIDd:         4.89 cm LVIDs:         3.88 cm LV PW:         1.02 cm LV IVS:        1.18 cm  RIGHT VENTRICLE             IVC RV S prime:     12.10 cm/s  IVC diam: 2.25 cm LEFT ATRIUM             Index       RIGHT ATRIUM           Index LA diam:        5.10 cm 2.48 cm/m  RA Area:     17.10 cm LA Vol (A2C):   65.0 ml 31.55 ml/m RA Volume:   42.20 ml  20.49 ml/m LA Vol (A4C):   87.3 ml 42.38 ml/m LA Biplane Vol: 80.4 ml 39.03 ml/m  AORTIC VALVE LVOT Vmax:   75.20 cm/s LVOT Vmean:  51.400 cm/s LVOT VTI:    0.155 m  AORTA Ao Root diam: 3.20 cm Ao Asc diam:  3.40 cm TRICUSPID VALVE TR Peak grad:   22.7 mmHg TR Vmax:        238.00 cm/s  SHUNTS Systemic VTI: 0.16 m Kate Sable MD Electronically signed by Kate Sable MD Signature Date/Time: 06/10/2019/1:43:49 PM    Final     Impression: 74 year old female with remote history of gastric bypass in 2007, history of IDA, anastomotic ulcer in 2016, known right-sided lateral ventral hernia,  recently inpatient March 2021 with abdominal pain s/p EGD with 2 cm ulcer crater at anastomosis with smaller satellite areas of ulceration in setting of NSAIDs/Goody powders, no bleeding stigmata. Evaluated by Dr.  Constance Haw while inpatient March 2021 and felt ventral hernia did not account for all  symptoms. Admitted with abdominal pain, intractable vomiting, newly documented afib with RVR now on Eliquis.  Abdominal pain: with associated N/V. From description, she has multiple sites for abdominal pain and states RLQ is predominant concern, worsened with movement; she also notes flares of chronic back pain contribute to abdominal pain, and was actually scheduled for surgery June 2nd. Known lateral ventral hernia that was evaluated in March 2021 while inpatient. Doubt dealing with an internal hernia, s/p gastric bypass, as she does not have typical symptoms such as weight loss, periumbilical abdominal pain, significant postprandial pain etc. Pain in multiple sites. Known anastomotic ulcer likely contributing to N/V, regurgitation. Interestingly, she states mashed potatoes and cauliflower with cheese are tolerated at home. Due for surveillance EGD mid June 2021. May need to pursue EGD while inpatient due to persistent reports of regurgitation/vomiting.   Afib with RVR: controlled. Cardiology on board. Clear from cardiac standpoint for endoscopic procedure if necessary. Would need to hold Eliquis prior.   She would benefit from outpatient follow-up with bariatric surgeon.  Plan: Continue PPI BID Continue clear liquid diet Received morning dose of Eliquis: would need to hold prior to EGD Will review CT from 5/22 with radiology Further recommendations to follow   Annitta Needs, PhD, ANP-BC [facility]      LOS: 1 day    06/11/2019, 8:24 AM

## 2019-06-11 NOTE — Progress Notes (Signed)
PROGRESS NOTE  [name] [id number] DOB: [birth year] DOA: 06/09/2019 PCP: Lemmie Evens, MD    Brief History:  74 y.o.femalewith medical history ofGERD, hyperlipidemia, hypothyroidism, gastric bypass, hypertension, peptic ulcer disease, anxiety presenting with abdominal pain that woke her up from sleep in early a.m. 06/09/2019. She stated that it is a periumbilical and right-sided. She states that she has had intermittent abdominal pain in the same area since her gastric bypass surgery in 2008. She cannot clarify any alleviating or aggravating factors. Nonetheless, patient had worsening of her abdominal pain waking her up from sleep as discussed above. She has chronic "retching and dry heaving" which she states has been unchanged. She denies any hematemesis. She denies any fevers, chills, diarrhea, hematochezia melena, dysuria, hematuria. The patient has been having some shortness of breath at least for the last 6 months, worse with exertion. She occasionally feels her heart fluttering during the same period of time. She states that she has some intermittent chest discomfort lasting 2 to 5 minutes. She is unable to clarify any exacerbating or alleviating factors. She states that this is been going on for this last 6 months. Notably, the patient was admitted to the hospital from 04/03/2019 to 04/05/2019 during which time she was worked up for hematemesis. EGD at that time showed a large anastomotic ulcer with smaller satellite ulcers. It was felt that this may been related to her NSAID use the patient was discharged home with PPI twice daily and Carafate. In the emergency department, patient was afebrile hemodynamically stable. Initial heart rate was in the 140-150 range. The patient was started on diltiazem drip. EKG showed atrial fibrillation/flutter with nonspecific ST-T wave changes. Chest x-ray was negative for edema or consolidation. CT of the abdomen pelvis  showed postoperative changes without any acute findings. The patient was started on diltiazem drip and admitted for further evaluation.  Assessment/Plan: Atrial fibrillation with RVR, type unspecified -IV diltiazem>>>po diltiazem -Start IV heparin>>>apixaban -Echocardiogram--EF 55-60%, no WMA, mild MR/TR -TSH--4.005 -CHADS-VASc = 4 (HTN, age, ASVD, female) -appreciate cardiology  Nausea and vomiting -Patient states that this has been occurring for the last 6 months -Continue PPI twice daily -Full liquid diet>>>downgrade to clears -pt endorses continued vomiting -GI consult appreciated-->planning repeat EGD in 48 hours -04/04/19--EGD--large anastomotic ulcer with small satellite ulcers -hold apixaban in preparation for EGD  Chronic abdominal pain -Lipase 25 -06/09/2019 CT abdomen/pelvis negative for acute findings -Suspect this may be in part due to the patient's right-sided ventral hernia -Judicious opioids -UDS--positive opiates -UA--no significant pyuria -GI eval as above  Right arm/Right Leg sensory disturbance and weak -MR cervical spine -she is due to have lumbar fusion on 06/20/19 with Dr. Newman Pies  Hyperglycemia/Impaired glucose tolerance -Check hemoglobin A1c--6.2  Hypothyroidism -Continue levothyroxine  Depression/anxiety -Continue Effexor      Status is: Inpatient  The patient will require care spanning > 2 midnights and should be moved to inpatient because: Inpatient level of care appropriate due to severity of illness.  Patient still vomiting, unable to tolerate po intake  Dispo: The patient is from: Home  Anticipated d/c is to: Home  Anticipated d/c date is: 2 days  Patient currently is not medically stable to d/c.        Family Communication:   No Family at bedside  Consultants:  GI  Code Status:  FULL   DVT Prophylaxis: xarelto--held   Procedures: As Listed in  Progress Note Above  Antibiotics: None  Subjective: Pt complains of one episode emesis last night.  Denies f/c, cp.  Sob has improved.  Still having abd pain but it is improving.  Complains of right arm weak, numb and right leg weak/numb  Objective: Vitals:   06/11/19 0606 06/11/19 0820 06/11/19 1030 06/11/19 1537  BP: 116/75  125/83 122/80  Pulse: 88  81 85  Resp: 20  18 19   Temp: 98.3 F (36.8 C)  98.5 F (36.9 C) 98.8 F (37.1 C)  TempSrc: Oral  Oral Oral  SpO2: 100% 96% 98% 97%  Weight:      Height:        Intake/Output Summary (Last 24 hours) at 06/11/2019 1720 Last data filed at 06/11/2019 1200 Gross per 24 hour  Intake 480 ml  Output --  Net 480 ml   Weight change:  Exam:   General:  Pt is alert, follows commands appropriately, not in acute distress  HEENT: No icterus, No thrush, No neck mass, [place]/AT  Cardiovascular: RRR, S1/S2, no rubs, no gallops  Respiratory: CTA bilaterally, no wheezing, no crackles, no rhonchi  Abdomen: Soft/+BS, periumbilica.l tender, non distended, no guarding  Extremities: No edema, No lymphangitis, No petechiae, No rashes, no synovitis  Neuro:  CN II-XII intact, strength 4/5 in RUE, RLE, strength 4/5 LUE, LLE; sensation intact bilateral; no dysmetria; babinski equivocal     Data Reviewed: I have personally reviewed following labs and imaging studies Basic Metabolic Panel: Recent Labs  Lab 06/09/19 1115 06/10/19 0011 06/11/19 0523  NA 140 142 137  K 3.5 3.6 3.8  CL 107 109 103  CO2 24 24 24   GLUCOSE 138* 123* 95  BUN 20 18 21   CREATININE 1.25* 1.07* 1.28*  CALCIUM 8.9 8.8* 9.2  MG  --  2.0 2.1   Liver Function Tests: Recent Labs  Lab 06/09/19 1115  AST 16  ALT 9  ALKPHOS 61  BILITOT 0.7  PROT 6.5  ALBUMIN 3.8   Recent Labs  Lab 06/09/19 1115  LIPASE 25   No results for input(s): AMMONIA in the last 168 hours. Coagulation Profile: No results for input(s): INR, PROTIME in the last 168  hours. CBC: Recent Labs  Lab 06/09/19 1115 06/10/19 0011 06/11/19 0523  WBC 7.4 6.8 6.1  NEUTROABS 5.4  --   --   HGB 9.1* 8.3* 8.4*  HCT 31.2* 28.1* 29.6*  MCV 79.0* 78.9* 80.2  PLT 292 266 261   Cardiac Enzymes: No results for input(s): CKTOTAL, CKMB, CKMBINDEX, TROPONINI in the last 168 hours. BNP: Invalid input(s): POCBNP CBG: No results for input(s): GLUCAP in the last 168 hours. HbA1C: Recent Labs    06/09/19 1115  HGBA1C 6.2*   Urine analysis:    Component Value Date/Time   COLORURINE YELLOW 06/09/2019 2100   APPEARANCEUR HAZY (A) 06/09/2019 2100   LABSPEC 1.033 (H) 06/09/2019 2100   PHURINE 6.0 06/09/2019 2100   GLUCOSEU NEGATIVE 06/09/2019 2100   HGBUR NEGATIVE 06/09/2019 2100   [place] NEGATIVE 06/09/2019 2100   [place] NEGATIVE 06/09/2019 2100   PROTEINUR NEGATIVE 06/09/2019 2100   UROBILINOGEN 0.2 05/09/2012 1741   NITRITE NEGATIVE 06/09/2019 2100   LEUKOCYTESUR LARGE (A) 06/09/2019 2100   Sepsis Labs: @LABRCNTIP (procalcitonin:4,lacticidven:4) ) Recent Results (from the past 240 hour(s))  SARS Coronavirus 2 by RT PCR (hospital order, performed in [place] hospital lab) Nasopharyngeal Nasopharyngeal Swab     Status: None   Collection Time: 06/09/19  2:19 PM   Specimen: Nasopharyngeal Swab  Result Value Ref Range Status  SARS Coronavirus 2 NEGATIVE NEGATIVE Final    Comment: (NOTE) SARS-CoV-2 target nucleic acids are NOT DETECTED. The SARS-CoV-2 RNA is generally detectable in upper and lower respiratory specimens during the acute phase of infection. The lowest concentration of SARS-CoV-2 viral copies this assay can detect is 250 copies / mL. A negative result does not preclude SARS-CoV-2 infection and should not be used as the sole basis for treatment or other patient management decisions.  A negative result may occur with improper specimen collection / handling, submission of specimen other than nasopharyngeal swab, presence of viral  mutation(s) within the areas targeted by this assay, and inadequate number of viral copies (<250 copies / mL). A negative result must be combined with clinical observations, patient history, and epidemiological information. Fact Sheet for Patients:   StrictlyIdeas.no Fact Sheet for Healthcare Providers: BankingDealers.co.za This test is not yet approved or cleared  by the [place] FDA and has been authorized for detection and/or diagnosis of SARS-CoV-2 by FDA under an Emergency Use Authorization (EUA).  This EUA will remain in effect (meaning this test can be used) for the duration of the COVID-19 declaration under Section 564(b)(1) of the Act, 21 U.S.C. section 360bbb-3(b)(1), unless the authorization is terminated or revoked sooner. Performed at [facility], [address], [place], [place] [address]   MRSA PCR Screening     Status: None   Collection Time: 06/09/19  4:04 PM   Specimen: Nasopharyngeal  Result Value Ref Range Status   MRSA by PCR NEGATIVE NEGATIVE Final    Comment:        The GeneXpert MRSA Assay (FDA approved for NASAL specimens only), is one component of a comprehensive MRSA colonization surveillance program. It is not intended to diagnose MRSA infection nor to guide or monitor treatment for MRSA infections. Performed at [facility], [address] [place], [place] [address]   Urine culture     Status: Abnormal   Collection Time: 06/09/19  9:00 PM   Specimen: Urine, Clean Catch  Result Value Ref Range Status   Specimen Description   Final    URINE, CLEAN CATCH Performed at [facility], [address], [place], [place] [address]    Special Requests   Final    NONE Performed at [facility], [address] [place], [place] [address]    Culture (A)  Final    <10,000 COLONIES/mL INSIGNIFICANT GROWTH Performed at [facility] [address] [place], [place] [address]    Report Status 06/11/2019  FINAL  Final     Scheduled Meds: . Chlorhexidine Gluconate Cloth  6 each Topical Daily  . [START ON 06/12/2019] diltiazem  120 mg Oral Daily  . diltiazem  30 mg Oral Q6H  . levothyroxine  75 mcg Oral QAC breakfast  . pantoprazole  40 mg Oral BID AC  . venlafaxine  75 mg Oral BID   Continuous Infusions:  Procedures/Studies: CT Abdomen Pelvis W Contrast  Result Date: 06/09/2019 CLINICAL DATA:  Generalized abdominal pain for several hours EXAM: CT ABDOMEN AND PELVIS WITH CONTRAST TECHNIQUE: Multidetector CT imaging of the abdomen and pelvis was performed using the standard protocol following bolus administration of intravenous contrast. CONTRAST:  54mL OMNIPAQUE IOHEXOL 300 MG/ML  SOLN COMPARISON:  None. FINDINGS: Lower chest: No acute abnormality. Hepatobiliary: No focal liver abnormality is seen. Status post cholecystectomy. No biliary dilatation. Pancreas: Unremarkable. No pancreatic ductal dilatation or surrounding inflammatory changes. Spleen: Normal in size without focal abnormality. Adrenals/Urinary Tract: Adrenal glands are  within normal limits. Kidneys demonstrate a normal enhancement pattern. No obstructive changes are seen. No renal calculi are noted. The bladder is well distended. Stomach/Bowel: Appendix has been surgically removed. No obstructive or inflammatory changes of the colon or small bowel are seen. Postsurgical changes in the stomach are noted consistent with gastric bypass. Vascular/Lymphatic: Aortic atherosclerosis. No enlarged abdominal or pelvic lymph nodes. Reproductive: Status post hysterectomy. No adnexal masses. Other: No abdominal wall hernia or abnormality. No abdominopelvic ascites. Musculoskeletal: Degenerative changes of lumbar spine are noted. Postsurgical changes are seen at L4-5. IMPRESSION: Postsurgical changes without acute abnormality. Electronically Signed   By: Inez Catalina M.D.   On: 06/09/2019 14:05   DG Chest Port 1 View  Result Date: 06/09/2019 CLINICAL  DATA:  74 year old female with a history of dyspnea EXAM: PORTABLE CHEST 1 VIEW COMPARISON:  04/20/2012 FINDINGS: The cardiac border appears slightly more prominent than the most recent comparison, potentially accentuated by apical lordotic positioning. Calcifications of the aortic arch. No pneumothorax or pleural effusion. No confluent airspace disease. Coarsened interstitial markings similar to the prior. No displaced fracture. Surgical changes of the cervical region. IMPRESSION: Negative for acute cardiopulmonary disease. Electronically Signed   By: Corrie Mckusick D.O.   On: 06/09/2019 12:04   ECHOCARDIOGRAM COMPLETE  Result Date: 06/10/2019    ECHOCARDIOGRAM REPORT   Patient Name:   [name] Date of Exam: 06/10/2019 Medical Rec #:  [id number]    Height:       65.0 in Accession #:    [id number]   Weight:       220.0 lb Date of Birth:  [birth year]    BSA:          2.060 m Patient Age:    22 years     BP:           113/73 mmHg Patient Gender: F            HR:           83 bpm. Exam Location:  [facility] Procedure: 2D Echo Indications:    atrial fibrillation  History:        Patient has prior history of Echocardiogram examinations, most                 recent 05/25/2012.  Sonographer:    Johny Chess RDCS Referring Phys: [phone] Shauntel Prest IMPRESSIONS  1. Left ventricular ejection fraction, by estimation, is 55 to 60%. The left ventricle has normal function. The left ventricle has no regional wall motion abnormalities. There is mild concentric left ventricular hypertrophy. Left ventricular diastolic parameters are indeterminate.  2. Right ventricular systolic function is normal. The right ventricular size is normal. There is normal pulmonary artery systolic pressure.  3. Left atrial size was severely dilated.  4. Right atrial size was mildly dilated.  5. The mitral valve is degenerative. Mild mitral valve regurgitation.  6. The aortic valve is tricuspid. Aortic valve regurgitation is not visualized. No aortic  stenosis is present.  7. The inferior vena cava is dilated in size with >50% respiratory variability, suggesting right atrial pressure of 8 mmHg. FINDINGS  Left Ventricle: Left ventricular ejection fraction, by estimation, is 55 to 60%. The left ventricle has normal function. The left ventricle has no regional wall motion abnormalities. The left ventricular internal cavity size was normal in size. There is  mild concentric left ventricular hypertrophy. Left ventricular diastolic parameters are indeterminate. Right Ventricle: The right ventricular size is normal. No increase in  right ventricular wall thickness. Right ventricular systolic function is normal. There is normal pulmonary artery systolic pressure. The tricuspid regurgitant velocity is 2.38 m/s, and  with an assumed right atrial pressure of 8 mmHg, the estimated right ventricular systolic pressure is 99991111 mmHg. Left Atrium: Left atrial size was severely dilated. Right Atrium: Right atrial size was mildly dilated. Pericardium: There is no evidence of pericardial effusion. Mitral Valve: The mitral valve is degenerative in appearance. There is mild thickening of the mitral valve leaflet(s). Mild mitral annular calcification. Mild mitral valve regurgitation. Tricuspid Valve: The tricuspid valve is grossly normal. Tricuspid valve regurgitation is mild. Aortic Valve: The aortic valve is tricuspid. . There is mild thickening and mild calcification of the aortic valve. Aortic valve regurgitation is not visualized. No aortic stenosis is present. Mild aortic valve annular calcification. There is mild thickening of the aortic valve. There is mild calcification of the aortic valve. Pulmonic Valve: The pulmonic valve was grossly normal. Pulmonic valve regurgitation is not visualized. Aorta: The aortic root is normal in size and structure. Venous: The inferior vena cava is dilated in size with greater than 50% respiratory variability, suggesting right atrial pressure of  8 mmHg. IAS/Shunts: No atrial level shunt detected by color flow Doppler.  LEFT VENTRICLE PLAX 2D LVIDd:         4.89 cm LVIDs:         3.88 cm LV PW:         1.02 cm LV IVS:        1.18 cm  RIGHT VENTRICLE             IVC RV S prime:     12.10 cm/s  IVC diam: 2.25 cm LEFT ATRIUM             Index       RIGHT ATRIUM           Index LA diam:        5.10 cm 2.48 cm/m  RA Area:     17.10 cm LA Vol (A2C):   65.0 ml 31.55 ml/m RA Volume:   42.20 ml  20.49 ml/m LA Vol (A4C):   87.3 ml 42.38 ml/m LA Biplane Vol: 80.4 ml 39.03 ml/m  AORTIC VALVE LVOT Vmax:   75.20 cm/s LVOT Vmean:  51.400 cm/s LVOT VTI:    0.155 m  AORTA Ao Root diam: 3.20 cm Ao Asc diam:  3.40 cm TRICUSPID VALVE TR Peak grad:   22.7 mmHg TR Vmax:        238.00 cm/s  SHUNTS Systemic VTI: 0.16 m Kate Sable MD Electronically signed by Kate Sable MD Signature Date/Time: 06/10/2019/1:43:49 PM    Final     Orson Eva, DO  [facility]  If 7PM-7AM, please contact night-coverage [email or web address] Password [email or web address] 06/11/2019, 5:20 PM   LOS: 1 day

## 2019-06-12 ENCOUNTER — Other Ambulatory Visit: Payer: Self-pay | Admitting: Neurosurgery

## 2019-06-12 ENCOUNTER — Inpatient Hospital Stay (HOSPITAL_COMMUNITY): Payer: PPO

## 2019-06-12 DIAGNOSIS — R1084 Generalized abdominal pain: Secondary | ICD-10-CM

## 2019-06-12 DIAGNOSIS — I4819 Other persistent atrial fibrillation: Secondary | ICD-10-CM

## 2019-06-12 LAB — CBC
HCT: 26.8 % — ABNORMAL LOW (ref 36.0–46.0)
Hemoglobin: 7.9 g/dL — ABNORMAL LOW (ref 12.0–15.0)
MCH: 23.2 pg — ABNORMAL LOW (ref 26.0–34.0)
MCHC: 29.5 g/dL — ABNORMAL LOW (ref 30.0–36.0)
MCV: 78.8 fL — ABNORMAL LOW (ref 80.0–100.0)
Platelets: 243 10*3/uL (ref 150–400)
RBC: 3.4 MIL/uL — ABNORMAL LOW (ref 3.87–5.11)
RDW: 16.9 % — ABNORMAL HIGH (ref 11.5–15.5)
WBC: 5.8 10*3/uL (ref 4.0–10.5)
nRBC: 0 % (ref 0.0–0.2)

## 2019-06-12 LAB — IRON AND TIBC
Iron: 20 ug/dL — ABNORMAL LOW (ref 28–170)
Saturation Ratios: 5 % — ABNORMAL LOW (ref 10.4–31.8)
TIBC: 436 ug/dL (ref 250–450)
UIBC: 416 ug/dL

## 2019-06-12 LAB — BASIC METABOLIC PANEL
Anion gap: 8 (ref 5–15)
BUN: 17 mg/dL (ref 8–23)
CO2: 26 mmol/L (ref 22–32)
Calcium: 8.8 mg/dL — ABNORMAL LOW (ref 8.9–10.3)
Chloride: 102 mmol/L (ref 98–111)
Creatinine, Ser: 1.18 mg/dL — ABNORMAL HIGH (ref 0.44–1.00)
GFR calc Af Amer: 53 mL/min — ABNORMAL LOW (ref >60)
GFR calc non Af Amer: 46 mL/min — ABNORMAL LOW (ref >60)
Glucose, Bld: 92 mg/dL (ref 70–99)
Potassium: 3.6 mmol/L (ref 3.5–5.1)
Sodium: 136 mmol/L (ref 135–145)

## 2019-06-12 LAB — FERRITIN: Ferritin: 5 ng/mL — ABNORMAL LOW (ref 11–307)

## 2019-06-12 LAB — MAGNESIUM: Magnesium: 2 mg/dL (ref 1.7–2.4)

## 2019-06-12 MED ORDER — OXYCODONE HCL 5 MG PO TABS
5.0000 mg | ORAL_TABLET | Freq: Four times a day (QID) | ORAL | Status: AC | PRN
  Administered 2019-06-12 – 2019-06-13 (×2): 5 mg via ORAL
  Filled 2019-06-12 (×2): qty 1

## 2019-06-12 MED ORDER — POLYETHYLENE GLYCOL 3350 17 G PO PACK
17.0000 g | PACK | Freq: Every day | ORAL | Status: DC
  Administered 2019-06-12 – 2019-06-15 (×3): 17 g via ORAL
  Filled 2019-06-12 (×4): qty 1

## 2019-06-12 MED ORDER — OXYCODONE HCL 5 MG PO TABS
5.0000 mg | ORAL_TABLET | Freq: Four times a day (QID) | ORAL | Status: AC | PRN
  Administered 2019-06-12 (×2): 5 mg via ORAL
  Filled 2019-06-12 (×2): qty 1

## 2019-06-12 NOTE — Evaluation (Signed)
Physical Therapy Evaluation Patient Details Name: [name] MRN: [id number] DOB: 1945-04-28 Today's Date: 06/12/2019   History of Present Illness  [name] is a 74 y.o. female with medical history of GERD, hyperlipidemia, hypothyroidism, gastric bypass, hypertension, peptic ulcer disease, anxiety presenting with abdominal pain that woke her up from sleep in early a.m. 06/09/2019.  She stated that it is a periumbilical and right-sided.  She states that she has had intermittent abdominal pain in the same area since her gastric bypass surgery in 2008.  She cannot clarify any alleviating or aggravating factors.  Nonetheless, patient had worsening of her abdominal pain waking her up from sleep as discussed above.  She has chronic "retching and dry heaving" which she states has been unchanged.  She denies any hematemesis.  She denies any fevers, chills, diarrhea, hematochezia melena, dysuria, hematuria.  The patient has been having some shortness of breath at least for the last 6 months, worse with exertion.  She occasionally feels her heart fluttering during the same period of time.  She states that she has some intermittent chest discomfort lasting 2 to 5 minutes.  She is unable to clarify any exacerbating or alleviating factors.  She states that this is been going on for this last 6 months.Notably, the patient was admitted to the hospital from 04/03/2019 to 04/05/2019 during which time she was worked up for hematemesis.  EGD at that time showed a large anastomotic ulcer with smaller satellite ulcers.  It was felt that this may been related to her NSAID use the patient was discharged home with PPI twice daily and Carafate.In the emergency department, patient was afebrile hemodynamically stable.  Initial heart rate was in the 140-150 range.  The patient was started on diltiazem drip.  EKG showed atrial fibrillation/flutter with nonspecific ST-T wave changes.  Chest x-ray was negative for edema or consolidation.   CT of the abdomen pelvis showed postoperative changes without any acute findings.  The patient was started on diltiazem drip and admitted for further evaluation.    Clinical Impression  Patient has poor tolerance for lying flat due to chronic low back pain, demonstrates good return for sitting up at bedside with head of bed partially raised, required use of RW for transfers and ambulation with good return demonstrates for use without loss of balance.  Patient tolerated sitting up in chair after therapy - NT notified.  Patient will benefit from continued physical therapy in hospital and recommended venue below to increase strength, balance, endurance for safe ADLs and gait.     Follow Up Recommendations Home health PT    Equipment Recommendations  Rolling walker with 5" wheels    Recommendations for Other Services       Precautions / Restrictions Precautions Precautions: Fall Restrictions Weight Bearing Restrictions: No      Mobility  Bed Mobility Overal bed mobility: Modified Independent             General bed mobility comments: head of bed raised  Transfers Overall transfer level: Needs assistance Equipment used: Rolling walker (2 wheeled) Transfers: Sit to/from Stand;Stand Pivot Transfers Sit to Stand: Supervision Stand pivot transfers: Supervision       General transfer comment: increased time, slightly labored movement  Ambulation/Gait Ambulation/Gait assistance: Supervision Gait Distance (Feet): 60 Feet Assistive device: Rolling walker (2 wheeled) Gait Pattern/deviations: Decreased step length - right;Decreased step length - left;Decreased stride length Gait velocity: decreased   General Gait Details: slightly labored cadence without loss of balance, limited  secondary to c/o fatigue  Stairs            Wheelchair Mobility    Modified Rankin (Stroke Patients Only)       Balance Overall balance assessment: Needs assistance Sitting-balance  support: Feet supported;No upper extremity supported Sitting balance-Leahy Scale: Good Sitting balance - Comments: seated at EOB                                     Pertinent Vitals/Pain Pain Assessment: No/denies pain    Home Living Family/patient expects to be discharged to:: Private residence Living Arrangements: Children Available Help at Discharge: Family;Available PRN/intermittently Type of Home: House Home Access: Stairs to enter Entrance Stairs-Rails: None Entrance Stairs-Number of Steps: 2 Home Layout: Two level;Able to live on main level with bedroom/bathroom Home Equipment: None      Prior Function Level of Independence: Needs assistance   Gait / Transfers Assistance Needed: household ambulator without AD, but occasionally leans on walls for support  ADL's / Homemaking Assistance Needed: Daughter helps her with community ADL's        Hand Dominance   Dominant Hand: Right    Extremity/Trunk Assessment   Upper Extremity Assessment Upper Extremity Assessment: Overall WFL for tasks assessed    Lower Extremity Assessment Lower Extremity Assessment: Generalized weakness    Cervical / Trunk Assessment Cervical / Trunk Assessment: Normal  Communication   Communication: No difficulties  Cognition Arousal/Alertness: Awake/alert Behavior During Therapy: WFL for tasks assessed/performed Overall Cognitive Status: Within Functional Limits for tasks assessed                                        General Comments      Exercises     Assessment/Plan    PT Assessment Patient needs continued PT services  PT Problem List Decreased strength;Decreased activity tolerance;Decreased balance;Decreased mobility       PT Treatment Interventions Balance training;Gait training;Stair training;Functional mobility training;Therapeutic activities;Therapeutic exercise;Patient/family education    PT Goals (Current goals can be found in the  Care Plan section)  Acute Rehab PT Goals Patient Stated Goal: return home with family to assist PT Goal Formulation: With patient Time For Goal Achievement: 06/15/19 Potential to Achieve Goals: Good    Frequency Min 3X/week   Barriers to discharge        Co-evaluation               AM-PAC PT "6 Clicks" Mobility  Outcome Measure Help needed turning from your back to your side while in a flat bed without using bedrails?: None Help needed moving from lying on your back to sitting on the side of a flat bed without using bedrails?: None Help needed moving to and from a bed to a chair (including a wheelchair)?: A Little Help needed standing up from a chair using your arms (e.g., wheelchair or bedside chair)?: None Help needed to walk in hospital room?: A Little Help needed climbing 3-5 steps with a railing? : A Little 6 Click Score: 21    End of Session   Activity Tolerance: Patient tolerated treatment well;Patient limited by fatigue Patient left: in chair;with call bell/phone within reach Nurse Communication: Mobility status PT Visit Diagnosis: Unsteadiness on feet (R26.81);Other abnormalities of gait and mobility (R26.89);Muscle weakness (generalized) (M62.81)    Time: [id number] PT Time  Calculation (min) (ACUTE ONLY): 24 min   Charges:   PT Evaluation $PT Eval Moderate Complexity: 1 Mod PT Treatments $Therapeutic Activity: 23-37 mins        10:19 AM, 06/12/19 Lonell Grandchild, MPT Physical Therapist with [phone] office [phone] mobile phone

## 2019-06-12 NOTE — Progress Notes (Addendum)
PROGRESS NOTE  [name] [id number] DOB: 02-26-1945 DOA: 06/09/2019 PCP: Lemmie Evens, MD  Brief History:  74 y.o.femalewith medical history ofGERD, hyperlipidemia, hypothyroidism, gastric bypass, hypertension, peptic ulcer disease, anxiety presenting with abdominal pain that woke her up from sleep in early a.m. 06/09/2019. She stated that it is a periumbilical and right-sided. She states that she has had intermittent abdominal pain in the same area since her gastric bypass surgery in 2008. She cannot clarify any alleviating or aggravating factors. Nonetheless, patient had worsening of her abdominal pain waking her up from sleep as discussed above. She has chronic "retching and dry heaving" which she states has been unchanged. She denies any hematemesis. She denies any fevers, chills, diarrhea, hematochezia melena, dysuria, hematuria. The patient has been having some shortness of breath at least for the last 6 months, worse with exertion. She occasionally feels her heart fluttering during the same period of time. She states that she has some intermittent chest discomfort lasting 2 to 5 minutes. She is unable to clarify any exacerbating or alleviating factors. She states that this is been going on for this last 6 months. Notably, the patient was admitted to the hospital from 04/03/2019 to 04/05/2019 during which time she was worked up for hematemesis. EGD at that time showed a large anastomotic ulcer with smaller satellite ulcers. It was felt that this may been related to her NSAID use the patient was discharged home with PPI twice daily and Carafate. In the emergency department, patient was afebrile hemodynamically stable. Initial heart rate was in the 140-150 range. The patient was started on diltiazem drip. EKG showed atrial fibrillation/flutter with nonspecific ST-T wave changes. Chest x-ray was negative for edema or consolidation. CT of the abdomen pelvis showed  postoperative changes without any acute findings. The patient was started on diltiazem drip and admitted for further evaluation.   Assessment/Plan: Atrial fibrillation with RVR, type unspecified -IVdiltiazem>>>po diltiazem CD -Start IV heparin>>>apixaban-->holding for EGD on  5/26 -Echocardiogram--EF 55-60%, no WMA, mild MR/TR -TSH--4.005 -CHADS-VASc = 4 (HTN, age, ASVD, female) -appreciate cardiology -now rate controlled  Nausea and vomiting -Patient states that this has been occurring for the last 6 months -Continue PPI twice daily -Full liquid diet>>>downgrade to clears--now patient wants upgrade to full liquids on 5/25 -pt endorses continued vomiting but improving -GI consult appreciated-->planning repeat EGD in 48 hours -04/04/19--EGD--large anastomotic ulcer with small satellite ulcers -hold apixaban in preparation for EGD on 5/26  Chronic abdominal pain -Lipase 25 -06/09/2019 CT abdomen/pelvis negative for acute findings -Suspect this may be in part due to the patient's right-sided ventral hernia and constipation -Judicious opioids -UDS--positive opiates -UA--no significant pyuria -GI eval as above -continue cathartics  Right Hand /Right Leg sensory disturbance and weak -06/12/19 MR cervical spine--moderate spinal stenois C3-4 due to disk degeneration and spurring;  Moderate C7-T1 foraminal encroachement -Right hand dysesthesia likely due to carpal tunnel syndrome--pt endorses recent NCV which confirmed carpal tunnel R>L wrist -she is due to have lumbar fusion on 06/20/19 with Dr. Newman Pies -06/12/19--MR brain--no acute findings  Hyperglycemia/Impaired glucose tolerance -Check hemoglobin A1c--6.2  Hypothyroidism -Continue levothyroxine  Depression/anxiety -Continue Effexor  CKD stage 3b -baseline creatinine 1.2-1.4 -am BMP       Status is: Inpatient  Remains inpatient appropriate because:IV treatments appropriate due to intensity of illness  or inability to take PO   Dispo: The patient is from: Home  Anticipated d/c is to: Home              Anticipated d/c date is: 2 days--when able to tolerate po without vomiting;  EGD on 06/13/19              Patient currently is not medically stable to d/c.         Family Communication:   No Family at bedside  Consultants:  GI  Code Status:  FULL  DVT Prophylaxis:  apixaban--on hold for EGD   Procedures: As Listed in Progress Note Above  Antibiotics: None       Subjective: Pt complains of constipation.  Denies f/c, cp, sob.  Continues to have periumbilical and epigastric pain.  Denies f/c, cp, sob, diarrhea, dysuria  Objective: Vitals:   06/11/19 2038 06/12/19 0530 06/12/19 1100 06/12/19 1350  BP: 121/80 114/81  116/80  Pulse: 84 85  90  Resp: 20 16  20   Temp: 97.7 F (36.5 C) 98.5 F (36.9 C)  98.7 F (37.1 C)  TempSrc: Oral   Oral  SpO2: 95% 98% 97% 94%  Weight:      Height:        Intake/Output Summary (Last 24 hours) at 06/12/2019 1647 Last data filed at 06/12/2019 1300 Gross per 24 hour  Intake 480 ml  Output --  Net 480 ml   Weight change:  Exam:   General:  Pt is alert, follows commands appropriately, not in acute distress  HEENT: No icterus, No thrush, No neck mass, [place]/AT  Cardiovascular: RRR, S1/S2, no rubs, no gallops  Respiratory: CTA bilaterally, no wheezing, no crackles, no rhonchi  Abdomen: Soft/+BS, non tender, non distended, no guarding  Extremities: No edema, No lymphangitis, No petechiae, No rashes, no synovitis   Data Reviewed: I have personally reviewed following labs and imaging studies Basic Metabolic Panel: Recent Labs  Lab 06/09/19 1115 06/10/19 0011 06/11/19 0523 06/12/19 0543  NA 140 142 137 136  K 3.5 3.6 3.8 3.6  CL 107 109 103 102  CO2 24 24 24 26   GLUCOSE 138* 123* 95 92  BUN 20 18 21 17   CREATININE 1.25* 1.07* 1.28* 1.18*  CALCIUM 8.9 8.8* 9.2 8.8*  MG  --  2.0 2.1 2.0   Liver  Function Tests: Recent Labs  Lab 06/09/19 1115  AST 16  ALT 9  ALKPHOS 61  BILITOT 0.7  PROT 6.5  ALBUMIN 3.8   Recent Labs  Lab 06/09/19 1115  LIPASE 25   No results for input(s): AMMONIA in the last 168 hours. Coagulation Profile: No results for input(s): INR, PROTIME in the last 168 hours. CBC: Recent Labs  Lab 06/09/19 1115 06/10/19 0011 06/11/19 0523 06/12/19 0543  WBC 7.4 6.8 6.1 5.8  NEUTROABS 5.4  --   --   --   HGB 9.1* 8.3* 8.4* 7.9*  HCT 31.2* 28.1* 29.6* 26.8*  MCV 79.0* 78.9* 80.2 78.8*  PLT 292 266 261 243   Cardiac Enzymes: No results for input(s): CKTOTAL, CKMB, CKMBINDEX, TROPONINI in the last 168 hours. BNP: Invalid input(s): POCBNP CBG: No results for input(s): GLUCAP in the last 168 hours. HbA1C: No results for input(s): HGBA1C in the last 72 hours. Urine analysis:    Component Value Date/Time   COLORURINE YELLOW 06/09/2019 2100   APPEARANCEUR HAZY (A) 06/09/2019 2100   LABSPEC 1.033 (H) 06/09/2019 2100   PHURINE 6.0 06/09/2019 2100   GLUCOSEU NEGATIVE 06/09/2019 2100   HGBUR NEGATIVE 06/09/2019 2100   BILIRUBINUR NEGATIVE  06/09/2019 2100   [place] 06/09/2019 2100   PROTEINUR NEGATIVE 06/09/2019 2100   UROBILINOGEN 0.2 05/09/2012 1741   NITRITE NEGATIVE 06/09/2019 2100   LEUKOCYTESUR LARGE (A) 06/09/2019 2100   Sepsis Labs: @LABRCNTIP (procalcitonin:4,lacticidven:4) ) Recent Results (from the past 240 hour(s))  SARS Coronavirus 2 by RT PCR (hospital order, performed in [facility] hospital lab) Nasopharyngeal Nasopharyngeal Swab     Status: None   Collection Time: 06/09/19  2:19 PM   Specimen: Nasopharyngeal Swab  Result Value Ref Range Status   SARS Coronavirus 2 NEGATIVE NEGATIVE Final    Comment: (NOTE) SARS-CoV-2 target nucleic acids are NOT DETECTED. The SARS-CoV-2 RNA is generally detectable in upper and lower respiratory specimens during the acute phase of infection. The lowest concentration of SARS-CoV-2 viral  copies this assay can detect is 250 copies / mL. A negative result does not preclude SARS-CoV-2 infection and should not be used as the sole basis for treatment or other patient management decisions.  A negative result may occur with improper specimen collection / handling, submission of specimen other than nasopharyngeal swab, presence of viral mutation(s) within the areas targeted by this assay, and inadequate number of viral copies (<250 copies / mL). A negative result must be combined with clinical observations, patient history, and epidemiological information. Fact Sheet for Patients:   StrictlyIdeas.no Fact Sheet for Healthcare Providers: BankingDealers.co.za This test is not yet approved or cleared  by the [place] FDA and has been authorized for detection and/or diagnosis of SARS-CoV-2 by FDA under an Emergency Use Authorization (EUA).  This EUA will remain in effect (meaning this test can be used) for the duration of the COVID-19 declaration under Section 564(b)(1) of the Act, 21 U.S.C. section 360bbb-3(b)(1), unless the authorization is terminated or revoked sooner. Performed at [facility], [address], [place], [place] [address]   MRSA PCR Screening     Status: None   Collection Time: 06/09/19  4:04 PM   Specimen: Nasopharyngeal  Result Value Ref Range Status   MRSA by PCR NEGATIVE NEGATIVE Final    Comment:        The GeneXpert MRSA Assay (FDA approved for NASAL specimens only), is one component of a comprehensive MRSA colonization surveillance program. It is not intended to diagnose MRSA infection nor to guide or monitor treatment for MRSA infections. Performed at [facility], [address], [place], [place] [address]   Urine culture     Status: Abnormal   Collection Time: 06/09/19  9:00 PM   Specimen: Urine, Clean Catch  Result Value Ref Range Status   Specimen Description   Final    URINE, CLEAN  CATCH Performed at [facility], [address] [place], [place] [address]    Special Requests   Final    NONE Performed at [facility], [address], [place], [place] [address]    Culture (A)  Final    <10,000 COLONIES/mL INSIGNIFICANT GROWTH Performed at [facility] [address] [place], [place] [address]    Report Status 06/11/2019 FINAL  Final     Scheduled Meds:  Chlorhexidine Gluconate Cloth  6 each Topical Daily   diltiazem  120 mg Oral Daily   levothyroxine  75 mcg Oral QAC breakfast   pantoprazole  40 mg Oral BID AC   polyethylene glycol  17 g Oral Daily   venlafaxine  75 mg Oral BID   Continuous Infusions:  Procedures/Studies: MR BRAIN WO CONTRAST  Result Date: 06/12/2019 CLINICAL DATA:  Focal neuro deficit greater than 6 hours. Altered mental status 2 months. EXAM: MRI HEAD WITHOUT CONTRAST TECHNIQUE: Multiplanar, multiecho pulse sequences of the brain and surrounding structures were obtained without intravenous contrast. COMPARISON:  MRI head 01/16/2009 FINDINGS: Brain: Image quality degraded by artifact. New peers to be metal artifact from earrings. Mild atrophy. Negative for acute infarct. Diffusion-weighted imaging is significantly degraded by motion. Chronic microvascular ischemic changes are mild but have progressed since the prior study. Gradient echo imaging significantly rated by artifact. Negative for mass or edema. Vascular: Normal arterial flow voids Skull and upper cervical spine: No focal skeletal lesion Sinuses/Orbits: Mild mucosal edema paranasal sinuses. Right mastoid effusion. Bilateral cataract extraction Other: None IMPRESSION: No acute abnormality.  Mild chronic microvascular ischemic change Image quality degraded by artifact from earrings. Electronically Signed   By: Franchot Gallo M.D.   On: 06/12/2019 13:55   MR CERVICAL SPINE WO CONTRAST  Result Date: 06/12/2019 CLINICAL DATA:  Chronic neck pain. Cervical fusion. Right-sided  weakness. EXAM: MRI CERVICAL SPINE WITHOUT CONTRAST TECHNIQUE: Multiplanar, multisequence MR imaging of the cervical spine was performed. No intravenous contrast was administered. COMPARISON:  Cervical radiographs 03/13/2019 FINDINGS: Alignment: Mild retrolisthesis C2-3. Vertebrae: Negative for fracture or mass. ACDF with hardware C3 through C7. Hardware causes local artifact. Cord: No cord signal abnormality. Posterior Fossa, vertebral arteries, paraspinal tissues: Negative Disc levels: C2-3: Disc degeneration with diffuse endplate spurring. Cord flattening with moderate spinal stenosis. Neural foramina patent bilaterally C3-4: ACDF.  Negative for stenosis C4-5: ACDF.  Negative for stenosis C5-6: ACDF.  Negative for stenosis C6-7: ACDF.  Negative for stenosis C7-T1: Disc degeneration with diffuse endplate spurring and bilateral facet hypertrophy. Moderate foraminal encroachment bilaterally due to spurring. IMPRESSION: ACDF C3 through C7 without stenosis Disc degeneration and spurring at C3-4 causing moderate spinal stenosis bilaterally Moderate foraminal encroachment bilaterally C7-T1 due to spurring. Electronically Signed   By: Franchot Gallo M.D.   On: 06/12/2019 13:59   CT Abdomen Pelvis W Contrast  Result Date: 06/09/2019 CLINICAL DATA:  Generalized abdominal pain for several hours EXAM: CT ABDOMEN AND PELVIS WITH CONTRAST TECHNIQUE: Multidetector CT imaging of the abdomen and pelvis was performed using the standard protocol following bolus administration of intravenous contrast. CONTRAST:  47mL OMNIPAQUE IOHEXOL 300 MG/ML  SOLN COMPARISON:  None. FINDINGS: Lower chest: No acute abnormality. Hepatobiliary: No focal liver abnormality is seen. Status post cholecystectomy. No biliary dilatation. Pancreas: Unremarkable. No pancreatic ductal dilatation or surrounding inflammatory changes. Spleen: Normal in size without focal abnormality. Adrenals/Urinary Tract: Adrenal glands are within normal limits. Kidneys  demonstrate a normal enhancement pattern. No obstructive changes are seen. No renal calculi are noted. The bladder is well distended. Stomach/Bowel: Appendix has been surgically removed. No obstructive or inflammatory changes of the colon or small bowel are seen. Postsurgical changes in the stomach are noted consistent with gastric bypass. Vascular/Lymphatic: Aortic atherosclerosis. No enlarged abdominal or pelvic lymph nodes. Reproductive: Status post hysterectomy. No adnexal masses. Other: No abdominal wall hernia or abnormality. No abdominopelvic ascites. Musculoskeletal: Degenerative changes of lumbar spine are noted. Postsurgical changes are seen at L4-5. IMPRESSION: Postsurgical changes without acute abnormality. Electronically Signed   By: Inez Catalina M.D.   On: 06/09/2019 14:05   DG Chest Port 1 View  Result Date: 06/09/2019 CLINICAL DATA:  74 year old female with a history of dyspnea EXAM: PORTABLE CHEST 1 VIEW COMPARISON:  04/20/2012 FINDINGS: The cardiac border appears slightly more prominent than the most recent comparison, potentially accentuated by apical lordotic positioning. Calcifications of  the aortic arch. No pneumothorax or pleural effusion. No confluent airspace disease. Coarsened interstitial markings similar to the prior. No displaced fracture. Surgical changes of the cervical region. IMPRESSION: Negative for acute cardiopulmonary disease. Electronically Signed   By: Corrie Mckusick D.O.   On: 06/09/2019 12:04   ECHOCARDIOGRAM COMPLETE  Result Date: 06/10/2019    ECHOCARDIOGRAM REPORT   Patient Name:   [name] Date of Exam: 06/10/2019 Medical Rec #:  [id number]    Height:       65.0 in Accession #:    [id number]   Weight:       220.0 lb Date of Birth:  Jun 08, 1945    BSA:          2.060 m Patient Age:    28 years     BP:           113/73 mmHg Patient Gender: F            HR:           83 bpm. Exam Location:  [facility] Procedure: 2D Echo Indications:    atrial fibrillation  History:         Patient has prior history of Echocardiogram examinations, most                 recent 05/25/2012.  Sonographer:    Johny Chess RDCS Referring Phys: [phone] Rajveer Handler IMPRESSIONS  1. Left ventricular ejection fraction, by estimation, is 55 to 60%. The left ventricle has normal function. The left ventricle has no regional wall motion abnormalities. There is mild concentric left ventricular hypertrophy. Left ventricular diastolic parameters are indeterminate.  2. Right ventricular systolic function is normal. The right ventricular size is normal. There is normal pulmonary artery systolic pressure.  3. Left atrial size was severely dilated.  4. Right atrial size was mildly dilated.  5. The mitral valve is degenerative. Mild mitral valve regurgitation.  6. The aortic valve is tricuspid. Aortic valve regurgitation is not visualized. No aortic stenosis is present.  7. The inferior vena cava is dilated in size with >50% respiratory variability, suggesting right atrial pressure of 8 mmHg. FINDINGS  Left Ventricle: Left ventricular ejection fraction, by estimation, is 55 to 60%. The left ventricle has normal function. The left ventricle has no regional wall motion abnormalities. The left ventricular internal cavity size was normal in size. There is  mild concentric left ventricular hypertrophy. Left ventricular diastolic parameters are indeterminate. Right Ventricle: The right ventricular size is normal. No increase in right ventricular wall thickness. Right ventricular systolic function is normal. There is normal pulmonary artery systolic pressure. The tricuspid regurgitant velocity is 2.38 m/s, and  with an assumed right atrial pressure of 8 mmHg, the estimated right ventricular systolic pressure is [id number] mmHg. Left Atrium: Left atrial size was severely dilated. Right Atrium: Right atrial size was mildly dilated. Pericardium: There is no evidence of pericardial effusion. Mitral Valve: The mitral valve is degenerative  in appearance. There is mild thickening of the mitral valve leaflet(s). Mild mitral annular calcification. Mild mitral valve regurgitation. Tricuspid Valve: The tricuspid valve is grossly normal. Tricuspid valve regurgitation is mild. Aortic Valve: The aortic valve is tricuspid. . There is mild thickening and mild calcification of the aortic valve. Aortic valve regurgitation is not visualized. No aortic stenosis is present. Mild aortic valve annular calcification. There is mild thickening of the aortic valve. There is mild calcification of the aortic valve. Pulmonic Valve: The pulmonic valve was grossly  normal. Pulmonic valve regurgitation is not visualized. Aorta: The aortic root is normal in size and structure. Venous: The inferior vena cava is dilated in size with greater than 50% respiratory variability, suggesting right atrial pressure of 8 mmHg. IAS/Shunts: No atrial level shunt detected by color flow Doppler.  LEFT VENTRICLE PLAX 2D LVIDd:         4.89 cm LVIDs:         3.88 cm LV PW:         1.02 cm LV IVS:        1.18 cm  RIGHT VENTRICLE             IVC RV S prime:     12.10 cm/s  IVC diam: 2.25 cm LEFT ATRIUM             Index       RIGHT ATRIUM           Index LA diam:        5.10 cm 2.48 cm/m  RA Area:     17.10 cm LA Vol (A2C):   65.0 ml 31.55 ml/m RA Volume:   42.20 ml  20.49 ml/m LA Vol (A4C):   87.3 ml 42.38 ml/m LA Biplane Vol: 80.4 ml 39.03 ml/m  AORTIC VALVE LVOT Vmax:   75.20 cm/s LVOT Vmean:  51.400 cm/s LVOT VTI:    0.155 m  AORTA Ao Root diam: 3.20 cm Ao Asc diam:  3.40 cm TRICUSPID VALVE TR Peak grad:   22.7 mmHg TR Vmax:        238.00 cm/s  SHUNTS Systemic VTI: 0.16 m Kate Sable MD Electronically signed by Kate Sable MD Signature Date/Time: 06/10/2019/1:43:49 PM    Final     Orson Eva, DO  [facility]  If 7PM-7AM, please contact night-coverage [email or web address] Password [facility] 06/12/2019, 4:47 PM   LOS: 2 days

## 2019-06-12 NOTE — Progress Notes (Signed)
Subjective: Improvement in nausea. Still comes and goes. Gagging resolved. No vomiting. Abdomial pain continues to be intermittent and in various locations, epigastric, RLQ, and LLQ. Pulling sensation. States her hernia causes her trouble. Worse with increasing intra-abdomianl pressure/lifting/pulling. Epigastric pain worsens with meals. Feels a fullness after eating. Foods "take forever to digest." Notes burning in her esophagus when eating. Has been having GERD symptoms several days a week on Protonix BID at home. Also reports chronic constipation. Can go up to 1 week without a BM. Has been taking stool softeners as needed No BM since Friday. No brbpr or melena.  Intermittent dark stools at home but not completely black.  No iron or Pepto-Bismol.  No hematemesis. Abdominal pain worsens with constipation.   Overall, feels all of her symptoms have worsened since COVID-19 diagnosis in January 2021.   Had chicken broth last night. States this worsened epigastric pain/N. Wanting to try full liquids. Feels this would do better for her.   Objective: Vital signs in last 24 hours: Temp:  [97.7 F (36.5 C)-98.8 F (37.1 C)] 98.5 F (36.9 C) (05/25 0530) Pulse Rate:  [81-85] 85 (05/25 0530) Resp:  [16-20] 16 (05/25 0530) BP: (114-125)/(80-83) 114/81 (05/25 0530) SpO2:  [95 %-98 %] 98 % (05/25 0530) Last BM Date: 06/10/19 General:   Alert and oriented, pleasant Head:  Normocephalic and atraumatic. Abdomen:  Bowel sounds present, soft, non-distended. Mild tenderness to palpation in the LLQ, RLQ and epigastric area. Seems to be worse in the epigastric area. No HSM or hernias noted. No rebound or guarding. No masses appreciated  Msk:  Symmetrical without gross deformities. Extremities:  Without edema. Neurologic:  Alert and  oriented x4;  grossly normal neurologically. Psych:  Normal mood and affect.  Intake/Output from previous day: 05/24 0701 - 05/25 0700 In: 480 [P.O.:480] Out: -   Intake/Output this shift: No intake/output data recorded.  Lab Results: Recent Labs    06/10/19 0011 06/11/19 0523 06/12/19 0543  WBC 6.8 6.1 5.8  HGB 8.3* 8.4* 7.9*  HCT 28.1* 29.6* 26.8*  PLT 266 261 243   BMET Recent Labs    06/10/19 0011 06/11/19 0523 06/12/19 0543  NA 142 137 136  K 3.6 3.8 3.6  CL 109 103 102  CO2 24 24 26   GLUCOSE 123* 95 92  BUN 18 21 17   CREATININE 1.07* 1.28* 1.18*  CALCIUM 8.8* 9.2 8.8*   LFT Recent Labs    06/09/19 1115  PROT 6.5  ALBUMIN 3.8  AST 16  ALT 9  ALKPHOS 61  BILITOT 0.7    Studies/Results: ECHOCARDIOGRAM COMPLETE  Result Date: 06/10/2019    ECHOCARDIOGRAM REPORT   Patient Name:   Stefanie Libel Date of Exam: 06/10/2019 Medical Rec #:  [id number]    Height:       65.0 in Accession #:    [id number]   Weight:       220.0 lb Date of Birth:  Aug 06, 1945    BSA:          2.060 m Patient Age:    34 years     BP:           113/73 mmHg Patient Gender: F            HR:           83 bpm. Exam Location:  [facility] Procedure: 2D Echo Indications:    atrial fibrillation  History:        Patient has prior  history of Echocardiogram examinations, most                 recent 05/25/2012.  Sonographer:    Johny Chess RDCS Referring Phys: [phone] DAVID TAT IMPRESSIONS  1. Left ventricular ejection fraction, by estimation, is 55 to 60%. The left ventricle has normal function. The left ventricle has no regional wall motion abnormalities. There is mild concentric left ventricular hypertrophy. Left ventricular diastolic parameters are indeterminate.  2. Right ventricular systolic function is normal. The right ventricular size is normal. There is normal pulmonary artery systolic pressure.  3. Left atrial size was severely dilated.  4. Right atrial size was mildly dilated.  5. The mitral valve is degenerative. Mild mitral valve regurgitation.  6. The aortic valve is tricuspid. Aortic valve regurgitation is not visualized. No aortic stenosis is present.  7. The  inferior vena cava is dilated in size with >50% respiratory variability, suggesting right atrial pressure of 8 mmHg. FINDINGS  Left Ventricle: Left ventricular ejection fraction, by estimation, is 55 to 60%. The left ventricle has normal function. The left ventricle has no regional wall motion abnormalities. The left ventricular internal cavity size was normal in size. There is  mild concentric left ventricular hypertrophy. Left ventricular diastolic parameters are indeterminate. Right Ventricle: The right ventricular size is normal. No increase in right ventricular wall thickness. Right ventricular systolic function is normal. There is normal pulmonary artery systolic pressure. The tricuspid regurgitant velocity is 2.38 m/s, and  with an assumed right atrial pressure of 8 mmHg, the estimated right ventricular systolic pressure is 99991111 mmHg. Left Atrium: Left atrial size was severely dilated. Right Atrium: Right atrial size was mildly dilated. Pericardium: There is no evidence of pericardial effusion. Mitral Valve: The mitral valve is degenerative in appearance. There is mild thickening of the mitral valve leaflet(s). Mild mitral annular calcification. Mild mitral valve regurgitation. Tricuspid Valve: The tricuspid valve is grossly normal. Tricuspid valve regurgitation is mild. Aortic Valve: The aortic valve is tricuspid. . There is mild thickening and mild calcification of the aortic valve. Aortic valve regurgitation is not visualized. No aortic stenosis is present. Mild aortic valve annular calcification. There is mild thickening of the aortic valve. There is mild calcification of the aortic valve. Pulmonic Valve: The pulmonic valve was grossly normal. Pulmonic valve regurgitation is not visualized. Aorta: The aortic root is normal in size and structure. Venous: The inferior vena cava is dilated in size with greater than 50% respiratory variability, suggesting right atrial pressure of 8 mmHg. IAS/Shunts: No atrial  level shunt detected by color flow Doppler.  LEFT VENTRICLE PLAX 2D LVIDd:         4.89 cm LVIDs:         3.88 cm LV PW:         1.02 cm LV IVS:        1.18 cm  RIGHT VENTRICLE             IVC RV S prime:     12.10 cm/s  IVC diam: 2.25 cm LEFT ATRIUM             Index       RIGHT ATRIUM           Index LA diam:        5.10 cm 2.48 cm/m  RA Area:     17.10 cm LA Vol (A2C):   65.0 ml 31.55 ml/m RA Volume:   42.20 ml  20.49 ml/m LA Vol (  A4C):   87.3 ml 42.38 ml/m LA Biplane Vol: 80.4 ml 39.03 ml/m  AORTIC VALVE LVOT Vmax:   75.20 cm/s LVOT Vmean:  51.400 cm/s LVOT VTI:    0.155 m  AORTA Ao Root diam: 3.20 cm Ao Asc diam:  3.40 cm TRICUSPID VALVE TR Peak grad:   22.7 mmHg TR Vmax:        238.00 cm/s  SHUNTS Systemic VTI: 0.16 m Kate Sable MD Electronically signed by Kate Sable MD Signature Date/Time: 06/10/2019/1:43:49 PM    Final     Assessment: 74 year old female with remote history of gastric bypass in 2007, history of IDA, anastomotic ulcer in 2016, known right-sided lateral ventral hernia,  recently inpatient March 2021 with abdominal pain s/p EGD with 2 cm ulcer crater at anastomosis with smaller satellite areas of ulceration in setting of NSAIDs/Goody powders, no bleeding stigmata. Evaluated by Dr. Constance Haw while inpatient March 2021 and felt ventral hernia did not account for all symptoms. Admitted with abdominal pain, intractable vomiting, newly documented afib with RVR started on Eliquis this admission.  Last dose of Eliquis morning of 06/11/2019.  Abdominal pain: Multiple sites of abdominal pain including RLQ, LLQ.  In general, abdominal pain is worsened with movement.  Upper abdominal pain also worsened with eating. Associated N/V without hematemesis. At this point, suspect her pain is multifactorial.  With known anastomotic ulcer, suspect this is likely contributing to epigastric pain/nausea/vomiting. Underlying constipation likely contributing.  There may also be a musculoskeletal  component/pain referred from chronic back pain. CT this admission with no acute findings.  History of cholecystectomy, appendectomy, hysterectomy, and gastric bypass.  No appreciable abdominal hernia although noted on prior CTs.  Roseanne Kaufman, NP reviewed CT with radiologist, Dr. Golden Circle who stated there was no evidence for internal hernia and did not appreciate abdominal wall hernia on this exam. Considering multiple abdominal surgeries, she may also have pain related to scar tissue/adhesions.   Will add MiraLAX for constipation, advance diet to full liquids at patients request as she feels she will tolerate this better, and plan for EGD this admission. At the earliest Wednesday afternoon, may be Thursday. Will discuss with Dr. Gala Romney. Would likely benefit from outpatient follow-up with bariatric surgeon.  GERD: Has not been well controlled on Protonix 40 BID as an outpatient.  Notes breakthrough symptoms several days a week.  Also with esophageal burning.  Notably, EGD in March 2021 with normal-appearing esophagus, 2 cm cratered ulcer at anastomosis and smaller satellite areas of ulceration secondary to NSAIDs.  She has discontinued NSAIDs.  Admits to drinking soda daily.  Suspect body habitus and dietary habits are contributing.  We will continue Protonix twice daily for now.  EGD this admission for epigastric pain/nausea/vomiting as discussed above which will also help evaluate esophageal burning.  May need to trial different PPI at discharge.  Possibly samples of Dexilant.  Anemia: History of IDA.  Hemoglobin in the 8-9 range since March.  Ferritin 15 in March 2021.  EGD March 2021 with 2 cm ulcer crater at anastomosis with smaller satellite areas of ulceration in setting of NSAIDs/Goody powders, no bleeding stigmata. Hemoglobin is fairly stale, down slightly today at 7.9.  No overt GI bleeding.  She does report stools can be dark at times but not black.  She has not been on oral iron or taking Pepto-Bismol.   She has discontinued NSAIDs and maintained on PPI twice daily as an outpatient.  Plans for EGD this admission to follow-up on PUD.  She will likely need oral iron at discharge. Will update iron panel.   Afib with RVR: controlled. Cardiology on board. Clear from cardiac standpoint for endoscopic procedure if necessary. Eliquis on hold with last dose morning of 5/24.  Plan: Likely plan for EGD with propofol either Wednesday afternoon or Thursday.  Will discuss with Dr. Gala Romney. Continue to hold Eliquis for now. Add MiraLAX 17 g daily. Advance to full liquid diet per patient request. Continue PPI twice daily. Continue Zofran as needed. Monitor for overt GI bleeding. Update iron panel.  Suspect patient will likely need oral iron at discharge.   LOS: 2 days    06/12/2019, 7:38 AM   Aliene Altes, PA-C [facility]

## 2019-06-12 NOTE — Progress Notes (Addendum)
Progress Note  Patient Name: [name] Date of Encounter: 06/12/2019  Primary Cardiologist: Jenkins Rouge, MD   Subjective   Reports brief palpitations but no persistent symptoms like she had experienced prior to admission. Breathing at baseline. No chest pain. Had sips of water overnight without N/V.   Inpatient Medications    Scheduled Meds: . Chlorhexidine Gluconate Cloth  6 each Topical Daily  . diltiazem  120 mg Oral Daily  . levothyroxine  75 mcg Oral QAC breakfast  . pantoprazole  40 mg Oral BID AC  . venlafaxine  75 mg Oral BID   Continuous Infusions:  PRN Meds: acetaminophen **OR** acetaminophen, melatonin, ondansetron **OR** ondansetron (ZOFRAN) IV   Vital Signs    Vitals:   06/11/19 1030 06/11/19 1537 06/11/19 2038 06/12/19 0530  BP: 125/83 122/80 121/80 114/81  Pulse: 81 85 84 85  Resp: 18 19 20 16   Temp: 98.5 F (36.9 C) 98.8 F (37.1 C) 97.7 F (36.5 C) 98.5 F (36.9 C)  TempSrc: Oral Oral Oral   SpO2: 98% 97% 95% 98%  Weight:      Height:        Intake/Output Summary (Last 24 hours) at 06/12/2019 0752 Last data filed at 06/11/2019 1200 Gross per 24 hour  Intake 480 ml  Output --  Net 480 ml    Last 3 Weights 06/09/2019 04/03/2019 04/03/2019  Weight (lbs) 220 lb 220 lb 220 lb  Weight (kg) 99.791 kg 99.79 kg 99.791 kg  Some encounter information is confidential and restricted. Go to Review Flowsheets activity to see all data.      Telemetry    Atrial fibrillation, HR mostly in 70's to 80's, peaking into 110's with activity. Some tele strips appear consistent with flutter. - Personally Reviewed  ECG    No new tracings.   Physical Exam   General: Well developed, well nourished, female appearing in no acute distress. Head: Normocephalic, atraumatic.  Neck: Supple without bruits, JVD not elevated. Lungs:  Resp regular and unlabored, CTA without wheezing or rales. Heart: Irregularly irregular, S1, S2, no S3, S4, or murmur; no  rub. Abdomen: Soft, non-tender, non-distended with normoactive bowel sounds. No hepatomegaly. No rebound/guarding. No obvious abdominal masses. Extremities: No clubbing or cyanosis, trace ankle edema. Distal pedal pulses are 2+ bilaterally. Neuro: Alert and oriented X 3. Moves all extremities spontaneously. Psych: Normal affect.  Labs    Chemistry Recent Labs  Lab 06/09/19 1115 06/09/19 1115 06/10/19 0011 06/11/19 0523 06/12/19 0543  NA 140   < > 142 137 136  K 3.5   < > 3.6 3.8 3.6  CL 107   < > 109 103 102  CO2 24   < > 24 24 26   GLUCOSE 138*   < > 123* 95 92  BUN 20   < > 18 21 17   CREATININE 1.25*   < > 1.07* 1.28* 1.18*  CALCIUM 8.9   < > 8.8* 9.2 8.8*  PROT 6.5  --   --   --   --   ALBUMIN 3.8  --   --   --   --   AST 16  --   --   --   --   ALT 9  --   --   --   --   ALKPHOS 61  --   --   --   --   BILITOT 0.7  --   --   --   --   [facility]  43*   < > 52* 42* 46*  GFRAA 50*   < > >60 48* 53*  ANIONGAP 9   < > 9 10 8    < > = values in this interval not displayed.     Hematology Recent Labs  Lab 06/10/19 0011 06/11/19 0523 06/12/19 0543  WBC 6.8 6.1 5.8  RBC 3.56* 3.69* 3.40*  HGB 8.3* 8.4* 7.9*  HCT 28.1* 29.6* 26.8*  MCV 78.9* 80.2 78.8*  MCH 23.3* 22.8* 23.2*  MCHC 29.5* 28.4* 29.5*  RDW 17.0* 17.2* 16.9*  PLT 266 261 243    Cardiac EnzymesNo results for input(s): TROPONINI in the last 168 hours. No results for input(s): TROPIPOC in the last 168 hours.   BNPNo results for input(s): BNP, PROBNP in the last 168 hours.   DDimer No results for input(s): DDIMER in the last 168 hours.   Radiology    ECHOCARDIOGRAM COMPLETE  Result Date: 06/10/2019    ECHOCARDIOGRAM REPORT   Patient Name:   [name] Date of Exam: 06/10/2019 Medical Rec #:  [id number]    Height:       65.0 in Accession #:    [id number]   Weight:       220.0 lb Date of Birth:  1945-05-16    BSA:          2.060 m Patient Age:    16 years     BP:           113/73 mmHg Patient Gender: F             HR:           83 bpm. Exam Location:  [facility] Procedure: 2D Echo Indications:    atrial fibrillation  History:        Patient has prior history of Echocardiogram examinations, most                 recent 05/25/2012.  Sonographer:    Johny Chess RDCS Referring Phys: [phone] DAVID TAT IMPRESSIONS  1. Left ventricular ejection fraction, by estimation, is 55 to 60%. The left ventricle has normal function. The left ventricle has no regional wall motion abnormalities. There is mild concentric left ventricular hypertrophy. Left ventricular diastolic parameters are indeterminate.  2. Right ventricular systolic function is normal. The right ventricular size is normal. There is normal pulmonary artery systolic pressure.  3. Left atrial size was severely dilated.  4. Right atrial size was mildly dilated.  5. The mitral valve is degenerative. Mild mitral valve regurgitation.  6. The aortic valve is tricuspid. Aortic valve regurgitation is not visualized. No aortic stenosis is present.  7. The inferior vena cava is dilated in size with >50% respiratory variability, suggesting right atrial pressure of 8 mmHg. FINDINGS  Left Ventricle: Left ventricular ejection fraction, by estimation, is 55 to 60%. The left ventricle has normal function. The left ventricle has no regional wall motion abnormalities. The left ventricular internal cavity size was normal in size. There is  mild concentric left ventricular hypertrophy. Left ventricular diastolic parameters are indeterminate. Right Ventricle: The right ventricular size is normal. No increase in right ventricular wall thickness. Right ventricular systolic function is normal. There is normal pulmonary artery systolic pressure. The tricuspid regurgitant velocity is 2.38 m/s, and  with an assumed right atrial pressure of 8 mmHg, the estimated right ventricular systolic pressure is 99991111 mmHg. Left Atrium: Left atrial size was severely dilated. Right Atrium: Right atrial size was  mildly dilated.  Pericardium: There is no evidence of pericardial effusion. Mitral Valve: The mitral valve is degenerative in appearance. There is mild thickening of the mitral valve leaflet(s). Mild mitral annular calcification. Mild mitral valve regurgitation. Tricuspid Valve: The tricuspid valve is grossly normal. Tricuspid valve regurgitation is mild. Aortic Valve: The aortic valve is tricuspid. . There is mild thickening and mild calcification of the aortic valve. Aortic valve regurgitation is not visualized. No aortic stenosis is present. Mild aortic valve annular calcification. There is mild thickening of the aortic valve. There is mild calcification of the aortic valve. Pulmonic Valve: The pulmonic valve was grossly normal. Pulmonic valve regurgitation is not visualized. Aorta: The aortic root is normal in size and structure. Venous: The inferior vena cava is dilated in size with greater than 50% respiratory variability, suggesting right atrial pressure of 8 mmHg. IAS/Shunts: No atrial level shunt detected by color flow Doppler.  LEFT VENTRICLE PLAX 2D LVIDd:         4.89 cm LVIDs:         3.88 cm LV PW:         1.02 cm LV IVS:        1.18 cm  RIGHT VENTRICLE             IVC RV S prime:     12.10 cm/s  IVC diam: 2.25 cm LEFT ATRIUM             Index       RIGHT ATRIUM           Index LA diam:        5.10 cm 2.48 cm/m  RA Area:     17.10 cm LA Vol (A2C):   65.0 ml 31.55 ml/m RA Volume:   42.20 ml  20.49 ml/m LA Vol (A4C):   87.3 ml 42.38 ml/m LA Biplane Vol: 80.4 ml 39.03 ml/m  AORTIC VALVE LVOT Vmax:   75.20 cm/s LVOT Vmean:  51.400 cm/s LVOT VTI:    0.155 m  AORTA Ao Root diam: 3.20 cm Ao Asc diam:  3.40 cm TRICUSPID VALVE TR Peak grad:   22.7 mmHg TR Vmax:        238.00 cm/s  SHUNTS Systemic VTI: 0.16 m Kate Sable MD Electronically signed by Kate Sable MD Signature Date/Time: 06/10/2019/1:43:49 PM    Final     Cardiac Studies   Echocardiogram: 05/2019 IMPRESSIONS    1. Left  ventricular ejection fraction, by estimation, is 55 to 60%. The  left ventricle has normal function. The left ventricle has no regional  wall motion abnormalities. There is mild concentric left ventricular  hypertrophy. Left ventricular diastolic  parameters are indeterminate.  2. Right ventricular systolic function is normal. The right ventricular  size is normal. There is normal pulmonary artery systolic pressure.  3. Left atrial size was severely dilated.  4. Right atrial size was mildly dilated.  5. The mitral valve is degenerative. Mild mitral valve regurgitation.  6. The aortic valve is tricuspid. Aortic valve regurgitation is not  visualized. No aortic stenosis is present.  7. The inferior vena cava is dilated in size with >50% respiratory  variability, suggesting right atrial pressure of 8 mmHg.   Patient Profile     74 y.o. female w/ PMH of HTN, HLD and prior tobacco use who presented for evaluation of abdominal pain, nausea and vomiting. She was found to be in atrial fibrillation with RVR upon admission.    Assessment & Plan  1. Atrial Fibrillation with RVR - she reports a history of palpitations for several years but no formal diagnosis of atrial fibrillation prior to admission. Echo shows a preserved EF of 55-60% but she does have severe LA dilation, therefore a rate-control strategy has been recommended. TSH 4.005. K+ 3.6 this AM with Mg at 2.0. - she was switched from IV Cardizem to PO Cardizem 30mg  Q6H on 5/23. Rates have been in the 70's to 80's, peaking into 110's with activity. Continue with plans to switch to Cardizem CD 120mg  daily which can be further titrated as an outpatient if needed.  - This patients CHA2DS2-VASc Score and unadjusted Ischemic Stroke Rate (% per year) is equal to 4.8 % stroke rate/year from a score of 4 (HTN, Female, Age, Aortic Plaque). She was started on Eliquis 5mg  BID at the time of admission but this is currently held in anticipation of  EGD.   2. Nausea and Vomiting/Anemia - being followed by GI with plans for repeat EGD later this week. Prior EGD in 03/2019 showed a large anastomotic ulcer. She remains on a clear liquid diet.  3. Anemia - Hgb was 9.1 on admission, at 7.9 today. Eliquis held in anticipation of EGD.   4. HTN - BP has been well-controlled at 114/80 - 125/83 within the past 24 hours. She remains on Cardizem CD 120mg  daily.   5. Hypothyroidism - TSH 4.005 this admission. Continue Synthroid at current dosing.   For questions or updates, please contact [place] Please consult [email or web address] for contact info under Cardiology/STEMI.   Arna Medici , PA-C 7:52 AM 06/12/2019 Pager: [phone]   Attending note:  Patient seen and examined, hospital course reviewed since encounter yesterday.  I agree with above documentation.  Patient remains in rate controlled atrial fibrillation, starting on Cardizem CD 120 mg daily today.  Eliquis is currently on hold in anticipation of follow-up EGD.  Hemoglobin today is down to 7.9.  No new recommendations from a cardiac perspective as yet.  We will continue to follow.  Satira Sark, M.D., F.A.C.C.

## 2019-06-12 NOTE — Plan of Care (Signed)
  Problem: Acute Rehab PT Goals(only PT should resolve) Goal: Pt Will Go Supine/Side To Sit Outcome: Progressing Flowsheets (Taken 06/12/2019 1021) Pt will go Supine/Side to Sit:  with modified independence  Independently Goal: Patient Will Transfer Sit To/From Stand Outcome: Progressing Flowsheets (Taken 06/12/2019 1021) Patient will transfer sit to/from stand: with modified independence Goal: Pt Will Transfer Bed To Chair/Chair To Bed Outcome: Progressing Flowsheets (Taken 06/12/2019 1021) Pt will Transfer Bed to Chair/Chair to Bed: with modified independence Goal: Pt Will Ambulate Outcome: Progressing Flowsheets (Taken 06/12/2019 1021) Pt will Ambulate:  75 feet  with modified independence  with rolling walker   10:22 AM, 06/12/19 Lonell Grandchild, MPT Physical Therapist with [facility] [phone] office [phone] mobile phone

## 2019-06-12 NOTE — Progress Notes (Signed)
   Progress Note  Patient Name: RHYLAN BUOL Date of Encounter: 06/12/2019  Patient remains in rate controlled atrial fibrillation following conversion to Cardizem CD 120 mg daily.  Would continue same for now.  Eliquis is on hold pending follow-up EGD by gastroenterology.  Depending on results, this can likely be resumed thereafter and we have already arranged follow-up in the office.  We will sign off.  Signed, Rozann Lesches, MD  06/12/2019, 5:26 PM

## 2019-06-12 NOTE — Progress Notes (Signed)
Patient has had no active episodes of vomiting during shift.

## 2019-06-13 ENCOUNTER — Inpatient Hospital Stay (HOSPITAL_COMMUNITY): Payer: PPO | Admitting: Anesthesiology

## 2019-06-13 ENCOUNTER — Encounter (HOSPITAL_COMMUNITY)
Admission: EM | Disposition: A | Discharge status: Home Health | Payer: Self-pay | Source: Home / Self Care | Attending: Internal Medicine

## 2019-06-13 ENCOUNTER — Encounter (HOSPITAL_COMMUNITY): Payer: Self-pay | Admitting: Internal Medicine

## 2019-06-13 DIAGNOSIS — R1314 Dysphagia, pharyngoesophageal phase: Secondary | ICD-10-CM

## 2019-06-13 DIAGNOSIS — M549 Dorsalgia, unspecified: Secondary | ICD-10-CM

## 2019-06-13 HISTORY — PX: ESOPHAGOGASTRODUODENOSCOPY (EGD) WITH PROPOFOL: SHX5813

## 2019-06-13 LAB — CBC
HCT: 29.4 % — ABNORMAL LOW (ref 36.0–46.0)
Hemoglobin: 8.6 g/dL — ABNORMAL LOW (ref 12.0–15.0)
MCH: 23.2 pg — ABNORMAL LOW (ref 26.0–34.0)
MCHC: 29.3 g/dL — ABNORMAL LOW (ref 30.0–36.0)
MCV: 79.2 fL — ABNORMAL LOW (ref 80.0–100.0)
Platelets: 258 K/uL (ref 150–400)
RBC: 3.71 MIL/uL — ABNORMAL LOW (ref 3.87–5.11)
RDW: 17 % — ABNORMAL HIGH (ref 11.5–15.5)
WBC: 6.3 K/uL (ref 4.0–10.5)
nRBC: 0 % (ref 0.0–0.2)

## 2019-06-13 SURGERY — ESOPHAGOGASTRODUODENOSCOPY (EGD) WITH PROPOFOL
Anesthesia: General

## 2019-06-13 MED ORDER — PROPOFOL 10 MG/ML IV BOLUS
INTRAVENOUS | Status: AC
  Filled 2019-06-13: qty 40

## 2019-06-13 MED ORDER — PROPOFOL 500 MG/50ML IV EMUL
INTRAVENOUS | Status: DC | PRN
  Administered 2019-06-13: 150 ug/kg/min via INTRAVENOUS

## 2019-06-13 MED ORDER — LIDOCAINE VISCOUS HCL 2 % MT SOLN
OROMUCOSAL | Status: AC
  Filled 2019-06-13: qty 15

## 2019-06-13 MED ORDER — LACTATED RINGERS IV SOLN
INTRAVENOUS | Status: DC | PRN
Start: 2019-06-13 — End: 2019-06-13

## 2019-06-13 MED ORDER — LIDOCAINE VISCOUS HCL 2 % MT SOLN
15.0000 mL | OROMUCOSAL | Status: DC | PRN
  Administered 2019-06-13: 15 mL via OROMUCOSAL
  Filled 2019-06-13: qty 15

## 2019-06-13 MED ORDER — OXYCODONE HCL 5 MG PO TABS
5.0000 mg | ORAL_TABLET | Freq: Four times a day (QID) | ORAL | Status: DC | PRN
  Administered 2019-06-13 – 2019-06-15 (×7): 5 mg via ORAL
  Filled 2019-06-13 (×7): qty 1

## 2019-06-13 MED ORDER — LIDOCAINE VISCOUS HCL 2 % MT SOLN
15.0000 mL | Freq: Once | OROMUCOSAL | Status: AC
  Administered 2019-06-13: 15 mL via OROMUCOSAL

## 2019-06-13 MED ORDER — SODIUM CHLORIDE 0.9 % IV SOLN: INTRAVENOUS | Status: DC

## 2019-06-13 NOTE — Op Note (Addendum)
[facility] Patient Name: [name] Procedure Date: 06/13/2019 2:35 PM MRN: [id number] Date of Birth: 1945/02/22 Attending MD: Norvel Richards , MD CSN: [id number] Age: 74 Admit Type: Inpatient Procedure:                Upper GI endoscopy Indications:              Esophageal dysphagia. Abdominal pain,nausea and                            vomiting                           Dysphagia Providers:                Norvel Richards, MD, Janeece Riggers, RN, Raphael Gibney, Technician Referring MD:              Medicines:                Propofol per Anesthesia Complications:            No immediate complications. Estimated Blood Loss:     Estimated blood loss: none. Procedure:                After obtaining informed consent, the endoscope was                            passed under direct vision. Throughout the                            procedure, the patient's blood pressure, pulse, and                            oxygen saturations were monitored continuously. The                            [id number]-[id number] ID:[id number]) scope was introduced through the                            mouth, and advanced to the effernt and afferent                            small bowel limbs. The upper GI endoscopy was                            accomplished without difficulty. The patient                            tolerated the procedure well. Scope In: 2:53:52 PM Scope Out: 3:03:48 PM Total Procedure Duration: 0 hours 9 minutes 56 seconds  Findings:      The examined esophagus was normal. Surgically altered stomach with       Billroth II type configuration. Small hiatal hernia present.       Healthy-appearing residual gastric ptosis. Anastomosis looks good.       Patent, normal appearing effernet and affernet  limbs The scope was       withdrawn. Dilation was performed with a Maloney dilator with mild       resistance at 36 Fr. The dilation site was examined following endoscope      reinsertion and showed no change. Estimated blood loss: none. Impression:               Status post prior gastric bypass surgery with                            Billroth II type anatomy as described. Otherwise,                            normal-appearing residual upper GI tract                           -This lady continues to complain somewhat bitterly                            about right mid abdominal pain with intermittent                            bulging. She has as much back pain as she does                            abdominal pain although I think they are 2 totally                            separate entities. Right sided spigelian hernia                            seen on a prior CT but not on her most recent scan.                            Likewise, no evidence of internal hernia seen on                            recent scan. Seen by Dr. Blake Divine                            previously. As she pointed out, not all internal                            hernias are evident on CT scan and she recommended                            referral to a bariatric surgeon for further                            evaluation. Cannot exclude an elusive spigelian                            hernia as a contributing factor to at least a  component of her abdominal pain.                           - Moderate Sedation:      Moderate (conscious) sedation was personally administered by an       anesthesia professional. The following parameters were monitored: oxygen       saturation, heart rate, blood pressure, respiratory rate, EKG, adequacy       of pulmonary ventilation, and response to care. Recommendation:           -Continue acid suppression therapy.                           -Low residue/bariatric type diet. Referral to a                            bariatric surgeon for further evaluation. Procedure Code(s):        --- Professional ---                            [phone], Esophagogastroduodenoscopy, flexible,                            transoral; diagnostic, including collection of                            specimen(s) by brushing or washing, when performed                            (separate procedure)                           43450, Dilation of esophagus, by unguided sound or                            bougie, single or multiple passes Diagnosis Code(s):        --- Professional ---                           R13.10, Dysphagia, unspecified CPT copyright 2019 [employer]. All rights reserved. The codes documented in this report are preliminary and upon coder review may  be revised to meet current compliance requirements. Cristopher Estimable. Aysel Gilchrest, MD Norvel Richards, MD 06/13/2019 3:31:25 PM This report has been signed electronically. Number of Addenda: 0

## 2019-06-13 NOTE — Progress Notes (Signed)
Pt down to endo via wheelchair accompanied by endo staff.

## 2019-06-13 NOTE — Anesthesia Postprocedure Evaluation (Signed)
Anesthesia Post Note  Patient: [name]  Procedure(s) Performed: ESOPHAGOGASTRODUODENOSCOPY (EGD) WITH PROPOFOL (N/A )  Patient location during evaluation: PACU Anesthesia Type: General Level of consciousness: awake, oriented, awake and alert and patient cooperative Pain management: pain level controlled Vital Signs Assessment: post-procedure vital signs reviewed and stable Respiratory status: spontaneous breathing, respiratory function stable and nonlabored ventilation Cardiovascular status: stable Postop Assessment: no apparent nausea or vomiting Anesthetic complications: no     Last Vitals:  Vitals:   06/13/19 1023 06/13/19 1410  BP: (!) 106/94 133/75  Pulse: 97   Resp:  18  Temp:  36.8 C  SpO2: 95% 96%    Last Pain:  Vitals:   06/13/19 1410  TempSrc: Oral  PainSc: 8                  Rajamani C Battula

## 2019-06-13 NOTE — Transfer of Care (Signed)
Immediate Anesthesia Transfer of Care Note  Patient: [name]  Procedure(s) Performed: ESOPHAGOGASTRODUODENOSCOPY (EGD) WITH PROPOFOL (N/A )  Patient Location: PACU  Anesthesia Type:General  Level of Consciousness: awake, alert , oriented and patient cooperative  Airway & Oxygen Therapy: Patient Spontanous Breathing  Post-op Assessment: Report given to RN, Post -op Vital signs reviewed and stable and Patient moving all extremities X 4  Post vital signs: Reviewed and stable  Last Vitals:  Vitals Value Taken Time  BP 113/85 06/13/19 1508  Temp    Pulse 94 06/13/19 1510  Resp 12 06/13/19 1510  SpO2 95 % 06/13/19 1510  Vitals shown include unvalidated device data.  Last Pain:  Vitals:   06/13/19 1410  TempSrc: Oral  PainSc: 8       Patients Stated Pain Goal: 9 [id number])  Complications: No apparent anesthesia complications

## 2019-06-13 NOTE — Progress Notes (Signed)
PROGRESS NOTE  [name] [id number] DOB: [birth year] DOA: 06/09/2019 PCP: Lemmie Evens, MD  Brief History:  74 y.o.femalewith medical history ofGERD, hyperlipidemia, hypothyroidism, gastric bypass, hypertension, peptic ulcer disease, anxiety presenting with abdominal pain that woke her up from sleep in early a.m. 06/09/2019. She stated that it is a periumbilical and right-sided. She states that she has had intermittent abdominal pain in the same area since her gastric bypass surgery in 2008. She cannot clarify any alleviating or aggravating factors. Nonetheless, patient had worsening of her abdominal pain waking her up from sleep as discussed above. She has chronic "retching and dry heaving" which she states has been unchanged. She denies any hematemesis. She denies any fevers, chills, diarrhea, hematochezia melena, dysuria, hematuria. The patient has been having some shortness of breath at least for the last 6 months, worse with exertion. She occasionally feels her heart fluttering during the same period of time. She states that she has some intermittent chest discomfort lasting 2 to 5 minutes. She is unable to clarify any exacerbating or alleviating factors. She states that this is been going on for this last 6 months. Notably, the patient was admitted to the hospital from 04/03/2019 to 04/05/2019 during which time she was worked up for hematemesis. EGD at that time showed a large anastomotic ulcer with smaller satellite ulcers. It was felt that this may been related to her NSAID use the patient was discharged home with PPI twice daily and Carafate. In the emergency department, patient was afebrile hemodynamically stable. Initial heart rate was in the 140-150 range. The patient was started on diltiazem drip. EKG showed atrial fibrillation/flutter with nonspecific ST-T wave changes. Chest x-ray was negative for edema or consolidation. CT of the abdomen pelvis showed  postoperative changes without any acute findings. The patient was started on diltiazem drip and admitted for further evaluation.   Assessment/Plan: Atrial fibrillation with RVR, type unspecified -IVdiltiazem>>>po diltiazem CD -Start IV heparin>>>apixaban-->holding for EGD on  5/26 -Echocardiogram--EF 55-60%, no WMA, mild MR/TR -TSH--4.005 -CHADS-VASc = 4 (HTN, age, ASVD, female) -appreciate cardiology -now rate controlled  Nausea and vomiting -Patient states that this has been occurring for the last 6 months -Continue PPI twice daily -Full liquid diet>>>downgrade to clears--now patient wants upgrade to full liquids on 5/25 -pt endorses continued vomiting but improving -GI consult appreciated- -04/04/19--EGD--large anastomotic ulcer with small satellite ulcers -hold apixaban in preparation for EGD on 5/26 EGD 5/26: No obvious ulcers.  She did have esophageal dilatation -Discussed with Dr. Gala Romney with recommendations to advance diet to full liquid bariatric diet.  If symptoms do not improve, may need referral to bariatric surgeon  Chronic abdominal pain -Lipase 25 -06/09/2019 CT abdomen/pelvis negative for acute findings -Suspect this may be in part due to the patient's right-sided ventral hernia and constipation -Judicious opioids -UDS--positive opiates -UA--no significant pyuria -GI eval as above -continue cathartics  Right Hand /Right Leg sensory disturbance and weak -06/12/19 MR cervical spine--moderate spinal stenois C3-4 due to disk degeneration and spurring;  Moderate C7-T1 foraminal encroachement -Right hand dysesthesia likely due to carpal tunnel syndrome--pt endorses recent NCV which confirmed carpal tunnel R>L wrist -she is due to have lumbar fusion on 06/20/19 with Dr. Newman Pies -06/12/19--MR brain--no acute findings  Hyperglycemia/Impaired glucose tolerance -Check hemoglobin A1c--6.2  Hypothyroidism -Continue  levothyroxine  Depression/anxiety -Continue Effexor  CKD stage 3b -baseline creatinine 1.2-1.4 -am BMP       Status is: Inpatient  Remains inpatient  appropriate because:IV treatments appropriate due to intensity of illness or inability to take PO.  Patient may need referral to bariatric surgeon if symptoms do not improve.   Dispo: The patient is from: Home              Anticipated d/c is to: Home              Anticipated d/c date is: 2 days--when able to tolerate po without vomiting;  EGD on 06/13/19              Patient currently is not medically stable to d/c.         Family Communication:   No Family at bedside  Consultants:  GI  Code Status:  FULL  DVT Prophylaxis:  apixaban--on hold for EGD   Procedures: As Listed in Progress Note Above  Antibiotics: None       Subjective: Continues to complain of pain in her right abdomen.  She also has continued back pain.  Reports that her bowel movements have been regular.  Objective: Vitals:   06/13/19 1515 06/13/19 1530 06/13/19 1551 06/13/19 1938  BP: 121/80 128/71 121/82   Pulse: 98 79 97   Resp: (!) 21 14 18    Temp:      TempSrc:      SpO2: 100% 99% 97% 97%  Weight:      Height:        Intake/Output Summary (Last 24 hours) at 06/13/2019 2044 Last data filed at 06/13/2019 1700 Gross per 24 hour  Intake 540 ml  Output 500 ml  Net 40 ml   Weight change:  Exam:  General exam: Alert, awake, oriented x 3 Respiratory system: Clear to auscultation. Respiratory effort normal. Cardiovascular system:RRR. No murmurs, rubs, gallops. Gastrointestinal system: Abdomen is nondistended, soft and nontender. No organomegaly or masses felt. Normal bowel sounds heard. Central nervous system: Alert and oriented. No focal neurological deficits. Extremities: No C/C/E, +pedal pulses Skin: No rashes, lesions or ulcers  Psychiatry: Judgement and insight appear normal. Mood & affect appropriate.    Data  Reviewed: I have personally reviewed following labs and imaging studies Basic Metabolic Panel: Recent Labs  Lab 06/09/19 1115 06/10/19 0011 06/11/19 0523 06/12/19 0543  NA 140 142 137 136  K 3.5 3.6 3.8 3.6  CL 107 109 103 102  CO2 24 24 24 26   GLUCOSE 138* 123* 95 92  BUN 20 18 21 17   CREATININE 1.25* 1.07* 1.28* 1.18*  CALCIUM 8.9 8.8* 9.2 8.8*  MG  --  2.0 2.1 2.0   Liver Function Tests: Recent Labs  Lab 06/09/19 1115  AST 16  ALT 9  ALKPHOS 61  BILITOT 0.7  PROT 6.5  ALBUMIN 3.8   Recent Labs  Lab 06/09/19 1115  LIPASE 25   No results for input(s): AMMONIA in the last 168 hours. Coagulation Profile: No results for input(s): INR, PROTIME in the last 168 hours. CBC: Recent Labs  Lab 06/09/19 1115 06/10/19 0011 06/11/19 0523 06/12/19 0543 06/13/19 0553  WBC 7.4 6.8 6.1 5.8 6.3  NEUTROABS 5.4  --   --   --   --   HGB 9.1* 8.3* 8.4* 7.9* 8.6*  HCT 31.2* 28.1* 29.6* 26.8* 29.4*  MCV 79.0* 78.9* 80.2 78.8* 79.2*  PLT 292 266 261 243 258   Cardiac Enzymes: No results for input(s): CKTOTAL, CKMB, CKMBINDEX, TROPONINI in the last 168 hours. BNP: Invalid input(s): POCBNP CBG: No results for input(s): GLUCAP in the last 168 hours.  HbA1C: No results for input(s): HGBA1C in the last 72 hours. Urine analysis:    Component Value Date/Time   COLORURINE YELLOW 06/09/2019 2100   APPEARANCEUR HAZY (A) 06/09/2019 2100   LABSPEC 1.033 (H) 06/09/2019 2100   PHURINE 6.0 06/09/2019 2100   GLUCOSEU NEGATIVE 06/09/2019 2100   HGBUR NEGATIVE 06/09/2019 2100   [place] NEGATIVE 06/09/2019 2100   KETONESUR NEGATIVE 06/09/2019 2100   PROTEINUR NEGATIVE 06/09/2019 2100   UROBILINOGEN 0.2 05/09/2012 1741   NITRITE NEGATIVE 06/09/2019 2100   LEUKOCYTESUR LARGE (A) 06/09/2019 2100   Sepsis Labs: @LABRCNTIP (procalcitonin:4,lacticidven:4) ) Recent Results (from the past 240 hour(s))  SARS Coronavirus 2 by RT PCR (hospital order, performed in [facility]  lab) Nasopharyngeal Nasopharyngeal Swab     Status: None   Collection Time: 06/09/19  2:19 PM   Specimen: Nasopharyngeal Swab  Result Value Ref Range Status   SARS Coronavirus 2 NEGATIVE NEGATIVE Final    Comment: (NOTE) SARS-CoV-2 target nucleic acids are NOT DETECTED. The SARS-CoV-2 RNA is generally detectable in upper and lower respiratory specimens during the acute phase of infection. The lowest concentration of SARS-CoV-2 viral copies this assay can detect is 250 copies / mL. A negative result does not preclude SARS-CoV-2 infection and should not be used as the sole basis for treatment or other patient management decisions.  A negative result may occur with improper specimen collection / handling, submission of specimen other than nasopharyngeal swab, presence of viral mutation(s) within the areas targeted by this assay, and inadequate number of viral copies (<250 copies / mL). A negative result must be combined with clinical observations, patient history, and epidemiological information. Fact Sheet for Patients:   StrictlyIdeas.no Fact Sheet for Healthcare Providers: BankingDealers.co.za This test is not yet approved or cleared  by the [place] FDA and has been authorized for detection and/or diagnosis of SARS-CoV-2 by FDA under an Emergency Use Authorization (EUA).  This EUA will remain in effect (meaning this test can be used) for the duration of the COVID-19 declaration under Section 564(b)(1) of the Act, 21 U.S.C. section 360bbb-3(b)(1), unless the authorization is terminated or revoked sooner. Performed at [facility], [address], [place], [place] [address]   MRSA PCR Screening     Status: None   Collection Time: 06/09/19  4:04 PM   Specimen: Nasopharyngeal  Result Value Ref Range Status   MRSA by PCR NEGATIVE NEGATIVE Final    Comment:        The GeneXpert MRSA Assay (FDA approved for NASAL specimens only), is  one component of a comprehensive MRSA colonization surveillance program. It is not intended to diagnose MRSA infection nor to guide or monitor treatment for MRSA infections. Performed at [facility], [address], [place], [place] [address]   Urine culture     Status: Abnormal   Collection Time: 06/09/19  9:00 PM   Specimen: Urine, Clean Catch  Result Value Ref Range Status   Specimen Description   Final    URINE, CLEAN CATCH Performed at [facility], [address] [place], [place] [address]    Special Requests   Final    NONE Performed at [facility], [address] [place], [place] [address]    Culture (A)  Final    <10,000 COLONIES/mL INSIGNIFICANT GROWTH Performed at [facility] [address], [place], [place] [address]    Report Status 06/11/2019 FINAL  Final     Scheduled Meds: . Chlorhexidine Gluconate Cloth  6 each  Topical Daily  . diltiazem  120 mg Oral Daily  . levothyroxine  75 mcg Oral QAC breakfast  . pantoprazole  40 mg Oral BID AC  . polyethylene glycol  17 g Oral Daily  . venlafaxine  75 mg Oral BID   Continuous Infusions:  Procedures/Studies: MR BRAIN WO CONTRAST  Result Date: 06/12/2019 CLINICAL DATA:  Focal neuro deficit greater than 6 hours. Altered mental status 2 months. EXAM: MRI HEAD WITHOUT CONTRAST TECHNIQUE: Multiplanar, multiecho pulse sequences of the brain and surrounding structures were obtained without intravenous contrast. COMPARISON:  MRI head 01/16/2009 FINDINGS: Brain: Image quality degraded by artifact. New peers to be metal artifact from earrings. Mild atrophy. Negative for acute infarct. Diffusion-weighted imaging is significantly degraded by motion. Chronic microvascular ischemic changes are mild but have progressed since the prior study. Gradient echo imaging significantly rated by artifact. Negative for mass or edema. Vascular: Normal arterial flow voids Skull and upper cervical spine: No focal skeletal lesion  Sinuses/Orbits: Mild mucosal edema paranasal sinuses. Right mastoid effusion. Bilateral cataract extraction Other: None IMPRESSION: No acute abnormality.  Mild chronic microvascular ischemic change Image quality degraded by artifact from earrings. Electronically Signed   By: Franchot Gallo M.D.   On: 06/12/2019 13:55   MR CERVICAL SPINE WO CONTRAST  Result Date: 06/12/2019 CLINICAL DATA:  Chronic neck pain. Cervical fusion. Right-sided weakness. EXAM: MRI CERVICAL SPINE WITHOUT CONTRAST TECHNIQUE: Multiplanar, multisequence MR imaging of the cervical spine was performed. No intravenous contrast was administered. COMPARISON:  Cervical radiographs 03/13/2019 FINDINGS: Alignment: Mild retrolisthesis C2-3. Vertebrae: Negative for fracture or mass. ACDF with hardware C3 through C7. Hardware causes local artifact. Cord: No cord signal abnormality. Posterior Fossa, vertebral arteries, paraspinal tissues: Negative Disc levels: C2-3: Disc degeneration with diffuse endplate spurring. Cord flattening with moderate spinal stenosis. Neural foramina patent bilaterally C3-4: ACDF.  Negative for stenosis C4-5: ACDF.  Negative for stenosis C5-6: ACDF.  Negative for stenosis C6-7: ACDF.  Negative for stenosis C7-T1: Disc degeneration with diffuse endplate spurring and bilateral facet hypertrophy. Moderate foraminal encroachment bilaterally due to spurring. IMPRESSION: ACDF C3 through C7 without stenosis Disc degeneration and spurring at C3-4 causing moderate spinal stenosis bilaterally Moderate foraminal encroachment bilaterally C7-T1 due to spurring. Electronically Signed   By: Franchot Gallo M.D.   On: 06/12/2019 13:59   CT Abdomen Pelvis W Contrast  Result Date: 06/09/2019 CLINICAL DATA:  Generalized abdominal pain for several hours EXAM: CT ABDOMEN AND PELVIS WITH CONTRAST TECHNIQUE: Multidetector CT imaging of the abdomen and pelvis was performed using the standard protocol following bolus administration of intravenous  contrast. CONTRAST:  69mL OMNIPAQUE IOHEXOL 300 MG/ML  SOLN COMPARISON:  None. FINDINGS: Lower chest: No acute abnormality. Hepatobiliary: No focal liver abnormality is seen. Status post cholecystectomy. No biliary dilatation. Pancreas: Unremarkable. No pancreatic ductal dilatation or surrounding inflammatory changes. Spleen: Normal in size without focal abnormality. Adrenals/Urinary Tract: Adrenal glands are within normal limits. Kidneys demonstrate a normal enhancement pattern. No obstructive changes are seen. No renal calculi are noted. The bladder is well distended. Stomach/Bowel: Appendix has been surgically removed. No obstructive or inflammatory changes of the colon or small bowel are seen. Postsurgical changes in the stomach are noted consistent with gastric bypass. Vascular/Lymphatic: Aortic atherosclerosis. No enlarged abdominal or pelvic lymph nodes. Reproductive: Status post hysterectomy. No adnexal masses. Other: No abdominal wall hernia or abnormality. No abdominopelvic ascites. Musculoskeletal: Degenerative changes of lumbar spine are noted. Postsurgical changes are seen at L4-5. IMPRESSION: Postsurgical changes without acute abnormality. Electronically Signed  By: Inez Catalina M.D.   On: 06/09/2019 14:05   DG Chest Port 1 View  Result Date: 06/09/2019 CLINICAL DATA:  74 year old female with a history of dyspnea EXAM: PORTABLE CHEST 1 VIEW COMPARISON:  04/20/2012 FINDINGS: The cardiac border appears slightly more prominent than the most recent comparison, potentially accentuated by apical lordotic positioning. Calcifications of the aortic arch. No pneumothorax or pleural effusion. No confluent airspace disease. Coarsened interstitial markings similar to the prior. No displaced fracture. Surgical changes of the cervical region. IMPRESSION: Negative for acute cardiopulmonary disease. Electronically Signed   By: Corrie Mckusick D.O.   On: 06/09/2019 12:04   ECHOCARDIOGRAM COMPLETE  Result Date:  06/10/2019    ECHOCARDIOGRAM REPORT   Patient Name:   OMARIA SEGUIN Date of Exam: 06/10/2019 Medical Rec #:  [id number]    Height:       65.0 in Accession #:    [id number]   Weight:       220.0 lb Date of Birth:  11/05/1945    BSA:          2.060 m Patient Age:    9 years     BP:           113/73 mmHg Patient Gender: F            HR:           83 bpm. Exam Location:  [facility] Procedure: 2D Echo Indications:    atrial fibrillation  History:        Patient has prior history of Echocardiogram examinations, most                 recent 05/25/2012.  Sonographer:    Johny Chess RDCS Referring Phys: [phone] DAVID TAT IMPRESSIONS  1. Left ventricular ejection fraction, by estimation, is 55 to 60%. The left ventricle has normal function. The left ventricle has no regional wall motion abnormalities. There is mild concentric left ventricular hypertrophy. Left ventricular diastolic parameters are indeterminate.  2. Right ventricular systolic function is normal. The right ventricular size is normal. There is normal pulmonary artery systolic pressure.  3. Left atrial size was severely dilated.  4. Right atrial size was mildly dilated.  5. The mitral valve is degenerative. Mild mitral valve regurgitation.  6. The aortic valve is tricuspid. Aortic valve regurgitation is not visualized. No aortic stenosis is present.  7. The inferior vena cava is dilated in size with >50% respiratory variability, suggesting right atrial pressure of 8 mmHg. FINDINGS  Left Ventricle: Left ventricular ejection fraction, by estimation, is 55 to 60%. The left ventricle has normal function. The left ventricle has no regional wall motion abnormalities. The left ventricular internal cavity size was normal in size. There is  mild concentric left ventricular hypertrophy. Left ventricular diastolic parameters are indeterminate. Right Ventricle: The right ventricular size is normal. No increase in right ventricular wall thickness. Right ventricular systolic  function is normal. There is normal pulmonary artery systolic pressure. The tricuspid regurgitant velocity is 2.38 m/s, and  with an assumed right atrial pressure of 8 mmHg, the estimated right ventricular systolic pressure is [id number] mmHg. Left Atrium: Left atrial size was severely dilated. Right Atrium: Right atrial size was mildly dilated. Pericardium: There is no evidence of pericardial effusion. Mitral Valve: The mitral valve is degenerative in appearance. There is mild thickening of the mitral valve leaflet(s). Mild mitral annular calcification. Mild mitral valve regurgitation. Tricuspid Valve: The tricuspid valve is grossly normal. Tricuspid valve regurgitation  is mild. Aortic Valve: The aortic valve is tricuspid. . There is mild thickening and mild calcification of the aortic valve. Aortic valve regurgitation is not visualized. No aortic stenosis is present. Mild aortic valve annular calcification. There is mild thickening of the aortic valve. There is mild calcification of the aortic valve. Pulmonic Valve: The pulmonic valve was grossly normal. Pulmonic valve regurgitation is not visualized. Aorta: The aortic root is normal in size and structure. Venous: The inferior vena cava is dilated in size with greater than 50% respiratory variability, suggesting right atrial pressure of 8 mmHg. IAS/Shunts: No atrial level shunt detected by color flow Doppler.  LEFT VENTRICLE PLAX 2D LVIDd:         4.89 cm LVIDs:         3.88 cm LV PW:         1.02 cm LV IVS:        1.18 cm  RIGHT VENTRICLE             IVC RV S prime:     12.10 cm/s  IVC diam: 2.25 cm LEFT ATRIUM             Index       RIGHT ATRIUM           Index LA diam:        5.10 cm 2.48 cm/m  RA Area:     17.10 cm LA Vol (A2C):   65.0 ml 31.55 ml/m RA Volume:   42.20 ml  20.49 ml/m LA Vol (A4C):   87.3 ml 42.38 ml/m LA Biplane Vol: 80.4 ml 39.03 ml/m  AORTIC VALVE LVOT Vmax:   75.20 cm/s LVOT Vmean:  51.400 cm/s LVOT VTI:    0.155 m  AORTA Ao Root diam: 3.20  cm Ao Asc diam:  3.40 cm TRICUSPID VALVE TR Peak grad:   22.7 mmHg TR Vmax:        238.00 cm/s  SHUNTS Systemic VTI: 0.16 m Kate Sable MD Electronically signed by Kate Sable MD Signature Date/Time: 06/10/2019/1:43:49 PM    Final     Kathie Dike, MD  [facility]  If 7PM-7AM, please contact night-coverage [email or web address]  06/13/2019, 8:44 PM   LOS: 3 days

## 2019-06-13 NOTE — Discharge Instructions (Signed)

## 2019-06-13 NOTE — Anesthesia Preprocedure Evaluation (Addendum)
Anesthesia Evaluation  Patient identified by MRN, date of birth, ID band Patient awake    Reviewed: Allergy & Precautions, NPO status , Patient's Chart, lab work & pertinent test results  History of Anesthesia Complications (+) PONV and history of anesthetic complications  Airway Mallampati: III  TM Distance: >3 FB Neck ROM: Full    Dental  (+) Dental Advisory Given, Poor Dentition, Missing, Chipped   Pulmonary shortness of breath, sleep apnea , Recent URI , Resolved, former smoker,  States unable to tolerate CPAP Denies O2 use smoking, or breathing meds    Pulmonary exam normal breath sounds clear to auscultation       Cardiovascular Exercise Tolerance: Poor hypertension, Pt. on medications Normal cardiovascular examII Rhythm:Regular Rate:Normal  1. Left ventricular ejection fraction, by estimation, is 55 to 60%. The  left ventricle has normal function. The left ventricle has no regional  wall motion abnormalities. There is mild concentric left ventricular  hypertrophy. Left ventricular diastolic  parameters are indeterminate.  2. Right ventricular systolic function is normal. The right ventricular  size is normal. There is normal pulmonary artery systolic pressure.  3. Left atrial size was severely dilated.  4. Right atrial size was mildly dilated.  5. The mitral valve is degenerative. Mild mitral valve regurgitation.  6. The aortic valve is tricuspid. Aortic valve regurgitation is not  visualized. No aortic stenosis is present.  7. The inferior vena cava is dilated in size with >50% respiratory  variability, suggesting right atrial pressure of 8 mmHg.    Neuro/Psych  Headaches, PSYCHIATRIC DISORDERS Anxiety Depression  Neuromuscular disease negative neurological ROS  negative psych ROS   GI/Hepatic negative GI ROS, Neg liver ROS, hiatal hernia, PUD, GERD  Medicated and Controlled,Abdominal pain and nausea    Endo/Other  negative endocrine ROSHypothyroidism   Renal/GU Renal InsufficiencyRenal diseasenegative Renal ROS  negative genitourinary   Musculoskeletal negative musculoskeletal ROS (+) Arthritis , Osteoarthritis,    Abdominal   Peds negative pediatric ROS (+)  Hematology negative hematology ROS (+) anemia ,   Anesthesia Other Findings   Reproductive/Obstetrics negative OB ROS                            Anesthesia Physical  Anesthesia Plan  ASA: IV  Anesthesia Plan: General   Post-op Pain Management:    Induction: Intravenous  PONV Risk Score and Plan: 4 or greater and TIVA and Propofol infusion  Airway Management Planned: Simple Face Mask, Nasal Cannula and Natural Airway  Additional Equipment:   Intra-op Plan:   Post-operative Plan: Possible Post-op intubation/ventilation  Informed Consent: I have reviewed the patients History and Physical, chart, labs and discussed the procedure including the risks, benefits and alternatives for the proposed anesthesia with the patient or authorized representative who has indicated his/her understanding and acceptance.     Dental advisory given  Plan Discussed with: CRNA and Surgeon  Anesthesia Plan Comments: (Possible GA with ETT was discussed.)        Anesthesia Quick Evaluation

## 2019-06-13 NOTE — Interval H&P Note (Signed)
History and Physical Interval Note:  06/13/2019 2:38 PM  [name]  has presented today for surgery, with the diagnosis of History of PUD March 2021, nausea/vomiting, epigastric pain, esophageal burning.  The various methods of treatment have been discussed with the patient and family. After consideration of risks, benefits and other options for treatment, the patient has consented to  Procedure(s): ESOPHAGOGASTRODUODENOSCOPY (EGD) WITH PROPOFOL (N/A) as a surgical intervention.  The patient's history has been reviewed, patient examined, no change in status, stable for surgery.  I have reviewed the patient's chart and labs.  Questions were answered to the patient's satisfaction.     [name]   Patient seen in short stay.  Remained stable.  Hemoglobin 8.6; patient complains of both periumbilical and back pain.  Continues to be concerned about right-sided abdominal "hernia".  Repeated intermittent bulging is bothersome for her.  Also, notes esophageal dysphagia intermittently to solids and pills. I am offering patient an EGD with possible esophageal dilation today as feasible/appropriate per plan. The risks, benefits, limitations, alternatives and imponderables have been reviewed with the patient. Potential for esophageal dilation, biopsy, etc. have also been reviewed.  Questions have been answered. All parties agreeable.   Further recommendations to follow.

## 2019-06-14 DIAGNOSIS — R109 Unspecified abdominal pain: Secondary | ICD-10-CM

## 2019-06-14 DIAGNOSIS — R112 Nausea with vomiting, unspecified: Secondary | ICD-10-CM

## 2019-06-14 LAB — CBC
HCT: 28.4 % — ABNORMAL LOW (ref 36.0–46.0)
Hemoglobin: 8.3 g/dL — ABNORMAL LOW (ref 12.0–15.0)
MCH: 22.9 pg — ABNORMAL LOW (ref 26.0–34.0)
MCHC: 29.2 g/dL — ABNORMAL LOW (ref 30.0–36.0)
MCV: 78.5 fL — ABNORMAL LOW (ref 80.0–100.0)
Platelets: 261 10*3/uL (ref 150–400)
RBC: 3.62 MIL/uL — ABNORMAL LOW (ref 3.87–5.11)
RDW: 16.9 % — ABNORMAL HIGH (ref 11.5–15.5)
WBC: 6.9 10*3/uL (ref 4.0–10.5)
nRBC: 0 % (ref 0.0–0.2)

## 2019-06-14 LAB — BASIC METABOLIC PANEL
Anion gap: 9 (ref 5–15)
BUN: 13 mg/dL (ref 8–23)
CO2: 27 mmol/L (ref 22–32)
Calcium: 9.1 mg/dL (ref 8.9–10.3)
Chloride: 103 mmol/L (ref 98–111)
Creatinine, Ser: 1.24 mg/dL — ABNORMAL HIGH (ref 0.44–1.00)
GFR calc Af Amer: 50 mL/min — ABNORMAL LOW (ref >60)
GFR calc non Af Amer: 43 mL/min — ABNORMAL LOW (ref >60)
Glucose, Bld: 102 mg/dL — ABNORMAL HIGH (ref 70–99)
Potassium: 4.3 mmol/L (ref 3.5–5.1)
Sodium: 139 mmol/L (ref 135–145)

## 2019-06-14 NOTE — Progress Notes (Signed)
PT Cancellation Note  Patient Details Name: [name] MRN: [id number] DOB: 1945/05/22   Cancelled Treatment:    Reason Eval/Treat Not Completed: Patient declined, no reason specified. Therapy offered 2x, but declined both times due to lightheadedness and wanting to rest. Pt requests therapy return tomorrow.    Tori Azarian Starace PT, DPT 06/14/19, 11:03 AM [phone]

## 2019-06-14 NOTE — Progress Notes (Signed)
PROGRESS NOTE  [name] [id number] DOB: [birth year] DOA: 06/09/2019 PCP: Lemmie Evens, MD  Brief History:  74 y.o.femalewith medical history ofGERD, hyperlipidemia, hypothyroidism, gastric bypass, hypertension, peptic ulcer disease, anxiety presenting with abdominal pain that woke her up from sleep in early a.m. 06/09/2019. She stated that it is a periumbilical and right-sided. She states that she has had intermittent abdominal pain in the same area since her gastric bypass surgery in 2008. She cannot clarify any alleviating or aggravating factors. Nonetheless, patient had worsening of her abdominal pain waking her up from sleep as discussed above. She has chronic "retching and dry heaving" which she states has been unchanged. She denies any hematemesis. She denies any fevers, chills, diarrhea, hematochezia melena, dysuria, hematuria. The patient has been having some shortness of breath at least for the last 6 months, worse with exertion. She occasionally feels her heart fluttering during the same period of time. She states that she has some intermittent chest discomfort lasting 2 to 5 minutes. She is unable to clarify any exacerbating or alleviating factors. She states that this is been going on for this last 6 months. Notably, the patient was admitted to the hospital from 04/03/2019 to 04/05/2019 during which time she was worked up for hematemesis. EGD at that time showed a large anastomotic ulcer with smaller satellite ulcers. It was felt that this may been related to her NSAID use the patient was discharged home with PPI twice daily and Carafate. In the emergency department, patient was afebrile hemodynamically stable. Initial heart rate was in the 140-150 range. The patient was started on diltiazem drip. EKG showed atrial fibrillation/flutter with nonspecific ST-T wave changes. Chest x-ray was negative for edema or consolidation. CT of the abdomen pelvis showed  postoperative changes without any acute findings. The patient was started on diltiazem drip and admitted for further evaluation.   Assessment/Plan: Atrial fibrillation with RVR, type unspecified -IVdiltiazem>>>po diltiazem CD -Start IV heparin>>>apixaban-->holding for EGD on  5/26 -We will resume apixaban on discharge if no further procedures are needed. -Echocardiogram--EF 55-60%, no WMA, mild MR/TR -TSH--4.005 -CHADS-VASc = 4 (HTN, age, ASVD, female) -appreciate cardiology -now rate controlled  Nausea and vomiting -Patient states that this has been occurring for the last 6 months -Continue PPI twice daily -Full liquid diet>>>downgrade to clears--now patient wants upgrade to full liquids on 5/25 -pt endorses continued vomiting but improving -GI consult appreciated- -04/04/19--EGD--large anastomotic ulcer with small satellite ulcers -hold apixaban in preparation for EGD on 5/26 EGD 5/26: No obvious ulcers.  She did have esophageal dilatation -Discussed with Dr. Gala Romney with recommendations to advance diet to full liquid bariatric diet.  If symptoms do not improve, may need referral to bariatric surgeon -Patient appears to be tolerating liquids, but still has pain and nausea. -Continue with patient management. -If symptoms are manageable, can consider outpatient referral to bariatric surgery.   Chronic abdominal pain -Lipase 25 -06/09/2019 CT abdomen/pelvis negative for acute findings -Suspect this may be in part due to the patient's right-sided ventral hernia and constipation -Judicious opioids -UDS--positive opiates -UA--no significant pyuria -GI eval as above -continue cathartics  Right Hand /Right Leg sensory disturbance and weak -06/12/19 MR cervical spine--moderate spinal stenois C3-4 due to disk degeneration and spurring;  Moderate C7-T1 foraminal encroachement -Right hand dysesthesia likely due to carpal tunnel syndrome--pt endorses recent NCV which confirmed carpal  tunnel R>L wrist -she is due to have lumbar fusion on 06/20/19 with Dr. Newman Pies -  06/12/19--MR brain--no acute findings  Hyperglycemia/Impaired glucose tolerance -Check hemoglobin A1c--6.2  Hypothyroidism -Continue levothyroxine  Depression/anxiety -Continue Effexor  CKD stage 3b -baseline creatinine 1.2-1.4 -am BMP       Status is: Inpatient  Remains inpatient appropriate because:IV treatments appropriate due to intensity of illness or inability to take PO.  Patient may need referral to bariatric surgeon if symptoms do not improve.   Dispo: The patient is from: Home              Anticipated d/c is to: Home              Anticipated d/c date is: Tomorrow              Patient currently is not medically stable to d/c.   Family Communication:   No Family at bedside  Consultants:  GI  Code Status:  FULL  DVT Prophylaxis:  apixaban--on hold for EGD   Procedures: As Listed in Progress Note Above  Antibiotics: None   Subjective: Reports nausea after consuming liquids.  No vomiting.  Reports continued abdominal pain which is somewhat better with medications  Objective: Vitals:   06/14/19 0448 06/14/19 1120 06/14/19 1424 06/14/19 1951  BP: 119/69 104/71 109/76   Pulse: 83 100 93   Resp: 18 16 19    Temp: 98.4 F (36.9 C)  98.5 F (36.9 C)   TempSrc: Oral  Oral   SpO2: 97% 99% 97% 93%  Weight:      Height:        Intake/Output Summary (Last 24 hours) at 06/14/2019 2033 Last data filed at 06/14/2019 1900 Gross per 24 hour  Intake 1200 ml  Output 850 ml  Net 350 ml   Weight change:  Exam:  General exam: Alert, awake, oriented x 3 Respiratory system: Clear to auscultation. Respiratory effort normal. Cardiovascular system:RRR. No murmurs, rubs, gallops. Gastrointestinal system: Abdomen is nondistended, soft and nontender. No organomegaly or masses felt. Normal bowel sounds heard. Central nervous system: Alert and oriented. No focal neurological  deficits. Extremities: No C/C/E, +pedal pulses Skin: No rashes, lesions or ulcers  Psychiatry: Judgement and insight appear normal. Mood & affect appropriate.    Data Reviewed: I have personally reviewed following labs and imaging studies Basic Metabolic Panel: Recent Labs  Lab 06/09/19 1115 06/10/19 0011 06/11/19 0523 06/12/19 0543 06/14/19 0457  NA 140 142 137 136 139  K 3.5 3.6 3.8 3.6 4.3  CL 107 109 103 102 103  CO2 24 24 24 26 27   GLUCOSE 138* 123* 95 92 102*  BUN 20 18 21 17 13   CREATININE 1.25* 1.07* 1.28* 1.18* 1.24*  CALCIUM 8.9 8.8* 9.2 8.8* 9.1  MG  --  2.0 2.1 2.0  --    Liver Function Tests: Recent Labs  Lab 06/09/19 1115  AST 16  ALT 9  ALKPHOS 61  BILITOT 0.7  PROT 6.5  ALBUMIN 3.8   Recent Labs  Lab 06/09/19 1115  LIPASE 25   No results for input(s): AMMONIA in the last 168 hours. Coagulation Profile: No results for input(s): INR, PROTIME in the last 168 hours. CBC: Recent Labs  Lab 06/09/19 1115 06/09/19 1115 06/10/19 0011 06/11/19 0523 06/12/19 0543 06/13/19 0553 06/14/19 0457  WBC 7.4   < > 6.8 6.1 5.8 6.3 6.9  NEUTROABS 5.4  --   --   --   --   --   --   HGB 9.1*   < > 8.3* 8.4* 7.9* 8.6* 8.3*  HCT  31.2*   < > 28.1* 29.6* 26.8* 29.4* 28.4*  MCV 79.0*   < > 78.9* 80.2 78.8* 79.2* 78.5*  PLT 292   < > 266 261 243 258 261   < > = values in this interval not displayed.   Cardiac Enzymes: No results for input(s): CKTOTAL, CKMB, CKMBINDEX, TROPONINI in the last 168 hours. BNP: Invalid input(s): POCBNP CBG: No results for input(s): GLUCAP in the last 168 hours. HbA1C: No results for input(s): HGBA1C in the last 72 hours. Urine analysis:    Component Value Date/Time   COLORURINE YELLOW 06/09/2019 2100   APPEARANCEUR HAZY (A) 06/09/2019 2100   LABSPEC 1.033 (H) 06/09/2019 2100   PHURINE 6.0 06/09/2019 2100   GLUCOSEU NEGATIVE 06/09/2019 2100   HGBUR NEGATIVE 06/09/2019 2100   [place] NEGATIVE 06/09/2019 2100   KETONESUR  NEGATIVE 06/09/2019 2100   PROTEINUR NEGATIVE 06/09/2019 2100   UROBILINOGEN 0.2 05/09/2012 1741   NITRITE NEGATIVE 06/09/2019 2100   LEUKOCYTESUR LARGE (A) 06/09/2019 2100   Sepsis Labs: @LABRCNTIP (procalcitonin:4,lacticidven:4) ) Recent Results (from the past 240 hour(s))  SARS Coronavirus 2 by RT PCR (hospital order, performed in [place] hospital lab) Nasopharyngeal Nasopharyngeal Swab     Status: None   Collection Time: 06/09/19  2:19 PM   Specimen: Nasopharyngeal Swab  Result Value Ref Range Status   SARS Coronavirus 2 NEGATIVE NEGATIVE Final    Comment: (NOTE) SARS-CoV-2 target nucleic acids are NOT DETECTED. The SARS-CoV-2 RNA is generally detectable in upper and lower respiratory specimens during the acute phase of infection. The lowest concentration of SARS-CoV-2 viral copies this assay can detect is 250 copies / mL. A negative result does not preclude SARS-CoV-2 infection and should not be used as the sole basis for treatment or other patient management decisions.  A negative result may occur with improper specimen collection / handling, submission of specimen other than nasopharyngeal swab, presence of viral mutation(s) within the areas targeted by this assay, and inadequate number of viral copies (<250 copies / mL). A negative result must be combined with clinical observations, patient history, and epidemiological information. Fact Sheet for Patients:   StrictlyIdeas.no Fact Sheet for Healthcare Providers: BankingDealers.co.za This test is not yet approved or cleared  by the [place] FDA and has been authorized for detection and/or diagnosis of SARS-CoV-2 by FDA under an Emergency Use Authorization (EUA).  This EUA will remain in effect (meaning this test can be used) for the duration of the COVID-19 declaration under Section 564(b)(1) of the Act, 21 U.S.C. section 360bbb-3(b)(1), unless the authorization is  terminated or revoked sooner. Performed at [facility], [address], [place], [place] [address]   MRSA PCR Screening     Status: None   Collection Time: 06/09/19  4:04 PM   Specimen: Nasopharyngeal  Result Value Ref Range Status   MRSA by PCR NEGATIVE NEGATIVE Final    Comment:        The GeneXpert MRSA Assay (FDA approved for NASAL specimens only), is one component of a comprehensive MRSA colonization surveillance program. It is not intended to diagnose MRSA infection nor to guide or monitor treatment for MRSA infections. Performed at [facility], [address] [place], [place] [address]   Urine culture     Status: Abnormal   Collection Time: 06/09/19  9:00 PM   Specimen: Urine, Clean Catch  Result Value Ref Range Status   Specimen Description   Final    URINE, CLEAN CATCH Performed at [facility], [address], [place], [place] [address]    Special Requests   Final    NONE Performed at [facility], [address] [place], [place] [address]    Culture (A)  Final    <10,000 COLONIES/mL INSIGNIFICANT GROWTH Performed at [facility] [address], [place], [place] [address]    Report Status 06/11/2019 FINAL  Final     Scheduled Meds: . Chlorhexidine Gluconate Cloth  6 each Topical Daily  . diltiazem  120 mg Oral Daily  . levothyroxine  75 mcg Oral QAC breakfast  . pantoprazole  40 mg Oral BID AC  . polyethylene glycol  17 g Oral Daily  . venlafaxine  75 mg Oral BID   Continuous Infusions:  Procedures/Studies: MR BRAIN WO CONTRAST  Result Date: 06/12/2019 CLINICAL DATA:  Focal neuro deficit greater than 6 hours. Altered mental status 2 months. EXAM: MRI HEAD WITHOUT CONTRAST TECHNIQUE: Multiplanar, multiecho pulse sequences of the brain and surrounding structures were obtained without intravenous contrast. COMPARISON:  MRI head 01/16/2009 FINDINGS: Brain: Image quality degraded by artifact. New peers to be metal artifact from earrings. Mild atrophy.  Negative for acute infarct. Diffusion-weighted imaging is significantly degraded by motion. Chronic microvascular ischemic changes are mild but have progressed since the prior study. Gradient echo imaging significantly rated by artifact. Negative for mass or edema. Vascular: Normal arterial flow voids Skull and upper cervical spine: No focal skeletal lesion Sinuses/Orbits: Mild mucosal edema paranasal sinuses. Right mastoid effusion. Bilateral cataract extraction Other: None IMPRESSION: No acute abnormality.  Mild chronic microvascular ischemic change Image quality degraded by artifact from earrings. Electronically Signed   By: Franchot Gallo M.D.   On: 06/12/2019 13:55   MR CERVICAL SPINE WO CONTRAST  Result Date: 06/12/2019 CLINICAL DATA:  Chronic neck pain. Cervical fusion. Right-sided weakness. EXAM: MRI CERVICAL SPINE WITHOUT CONTRAST TECHNIQUE: Multiplanar, multisequence MR imaging of the cervical spine was performed. No intravenous contrast was administered. COMPARISON:  Cervical radiographs 03/13/2019 FINDINGS: Alignment: Mild retrolisthesis C2-3. Vertebrae: Negative for fracture or mass. ACDF with hardware C3 through C7. Hardware causes local artifact. Cord: No cord signal abnormality. Posterior Fossa, vertebral arteries, paraspinal tissues: Negative Disc levels: C2-3: Disc degeneration with diffuse endplate spurring. Cord flattening with moderate spinal stenosis. Neural foramina patent bilaterally C3-4: ACDF.  Negative for stenosis C4-5: ACDF.  Negative for stenosis C5-6: ACDF.  Negative for stenosis C6-7: ACDF.  Negative for stenosis C7-T1: Disc degeneration with diffuse endplate spurring and bilateral facet hypertrophy. Moderate foraminal encroachment bilaterally due to spurring. IMPRESSION: ACDF C3 through C7 without stenosis Disc degeneration and spurring at C3-4 causing moderate spinal stenosis bilaterally Moderate foraminal encroachment bilaterally C7-T1 due to spurring. Electronically Signed    By: Franchot Gallo M.D.   On: 06/12/2019 13:59   CT Abdomen Pelvis W Contrast  Result Date: 06/09/2019 CLINICAL DATA:  Generalized abdominal pain for several hours EXAM: CT ABDOMEN AND PELVIS WITH CONTRAST TECHNIQUE: Multidetector CT imaging of the abdomen and pelvis was performed using the standard protocol following bolus administration of intravenous contrast. CONTRAST:  27mL OMNIPAQUE IOHEXOL 300 MG/ML  SOLN COMPARISON:  None. FINDINGS: Lower chest: No acute abnormality. Hepatobiliary: No focal liver abnormality is seen. Status post cholecystectomy. No biliary dilatation. Pancreas: Unremarkable. No pancreatic ductal dilatation or surrounding inflammatory changes. Spleen: Normal in size without focal abnormality. Adrenals/Urinary Tract: Adrenal glands are within normal limits. Kidneys demonstrate a normal enhancement pattern. No obstructive changes are seen. No renal calculi are noted. The bladder is well distended. Stomach/Bowel: Appendix  has been surgically removed. No obstructive or inflammatory changes of the colon or small bowel are seen. Postsurgical changes in the stomach are noted consistent with gastric bypass. Vascular/Lymphatic: Aortic atherosclerosis. No enlarged abdominal or pelvic lymph nodes. Reproductive: Status post hysterectomy. No adnexal masses. Other: No abdominal wall hernia or abnormality. No abdominopelvic ascites. Musculoskeletal: Degenerative changes of lumbar spine are noted. Postsurgical changes are seen at L4-5. IMPRESSION: Postsurgical changes without acute abnormality. Electronically Signed   By: Inez Catalina M.D.   On: 06/09/2019 14:05   DG Chest Port 1 View  Result Date: 06/09/2019 CLINICAL DATA:  74 year old female with a history of dyspnea EXAM: PORTABLE CHEST 1 VIEW COMPARISON:  04/20/2012 FINDINGS: The cardiac border appears slightly more prominent than the most recent comparison, potentially accentuated by apical lordotic positioning. Calcifications of the aortic  arch. No pneumothorax or pleural effusion. No confluent airspace disease. Coarsened interstitial markings similar to the prior. No displaced fracture. Surgical changes of the cervical region. IMPRESSION: Negative for acute cardiopulmonary disease. Electronically Signed   By: Corrie Mckusick D.O.   On: 06/09/2019 12:04   ECHOCARDIOGRAM COMPLETE  Result Date: 06/10/2019    ECHOCARDIOGRAM REPORT   Patient Name:   [name] Date of Exam: 06/10/2019 Medical Rec #:  [id number]    Height:       65.0 in Accession #:    [id number]   Weight:       220.0 lb Date of Birth:  08-Mar-1945    BSA:          2.060 m Patient Age:    67 years     BP:           113/73 mmHg Patient Gender: F            HR:           83 bpm. Exam Location:  [facility] Procedure: 2D Echo Indications:    atrial fibrillation  History:        Patient has prior history of Echocardiogram examinations, most                 recent 05/25/2012.  Sonographer:    Johny Chess RDCS Referring Phys: [phone] DAVID TAT IMPRESSIONS  1. Left ventricular ejection fraction, by estimation, is 55 to 60%. The left ventricle has normal function. The left ventricle has no regional wall motion abnormalities. There is mild concentric left ventricular hypertrophy. Left ventricular diastolic parameters are indeterminate.  2. Right ventricular systolic function is normal. The right ventricular size is normal. There is normal pulmonary artery systolic pressure.  3. Left atrial size was severely dilated.  4. Right atrial size was mildly dilated.  5. The mitral valve is degenerative. Mild mitral valve regurgitation.  6. The aortic valve is tricuspid. Aortic valve regurgitation is not visualized. No aortic stenosis is present.  7. The inferior vena cava is dilated in size with >50% respiratory variability, suggesting right atrial pressure of 8 mmHg. FINDINGS  Left Ventricle: Left ventricular ejection fraction, by estimation, is 55 to 60%. The left ventricle has normal function. The left  ventricle has no regional wall motion abnormalities. The left ventricular internal cavity size was normal in size. There is  mild concentric left ventricular hypertrophy. Left ventricular diastolic parameters are indeterminate. Right Ventricle: The right ventricular size is normal. No increase in right ventricular wall thickness. Right ventricular systolic function is normal. There is normal pulmonary artery systolic pressure. The tricuspid regurgitant velocity is 2.38 m/s, and  with an assumed right atrial pressure of 8 mmHg, the estimated right ventricular systolic pressure is [id number] mmHg. Left Atrium: Left atrial size was severely dilated. Right Atrium: Right atrial size was mildly dilated. Pericardium: There is no evidence of pericardial effusion. Mitral Valve: The mitral valve is degenerative in appearance. There is mild thickening of the mitral valve leaflet(s). Mild mitral annular calcification. Mild mitral valve regurgitation. Tricuspid Valve: The tricuspid valve is grossly normal. Tricuspid valve regurgitation is mild. Aortic Valve: The aortic valve is tricuspid. . There is mild thickening and mild calcification of the aortic valve. Aortic valve regurgitation is not visualized. No aortic stenosis is present. Mild aortic valve annular calcification. There is mild thickening of the aortic valve. There is mild calcification of the aortic valve. Pulmonic Valve: The pulmonic valve was grossly normal. Pulmonic valve regurgitation is not visualized. Aorta: The aortic root is normal in size and structure. Venous: The inferior vena cava is dilated in size with greater than 50% respiratory variability, suggesting right atrial pressure of 8 mmHg. IAS/Shunts: No atrial level shunt detected by color flow Doppler.  LEFT VENTRICLE PLAX 2D LVIDd:         4.89 cm LVIDs:         3.88 cm LV PW:         1.02 cm LV IVS:        1.18 cm  RIGHT VENTRICLE             IVC RV S prime:     12.10 cm/s  IVC diam: 2.25 cm LEFT ATRIUM              Index       RIGHT ATRIUM           Index LA diam:        5.10 cm 2.48 cm/m  RA Area:     17.10 cm LA Vol (A2C):   65.0 ml 31.55 ml/m RA Volume:   42.20 ml  20.49 ml/m LA Vol (A4C):   87.3 ml 42.38 ml/m LA Biplane Vol: 80.4 ml 39.03 ml/m  AORTIC VALVE LVOT Vmax:   75.20 cm/s LVOT Vmean:  51.400 cm/s LVOT VTI:    0.155 m  AORTA Ao Root diam: 3.20 cm Ao Asc diam:  3.40 cm TRICUSPID VALVE TR Peak grad:   22.7 mmHg TR Vmax:        238.00 cm/s  SHUNTS Systemic VTI: 0.16 m Kate Sable MD Electronically signed by Kate Sable MD Signature Date/Time: 06/10/2019/1:43:49 PM    Final     Kathie Dike, MD  [facility]  If 7PM-7AM, please contact night-coverage [email or web address]  06/14/2019, 8:33 PM   LOS: 4 days

## 2019-06-14 NOTE — TOC Progression Note (Signed)
Transition of Care [facility]) - Progression Note    Patient Details  Name: [name] MRN: [id number] Date of Birth: 1945-11-21  Transition of [facility]) CM/SW Contact  Boneta Lucks, RN Phone Number: 06/14/2019, 4:29 PM  Clinical Narrative:   Patient admitted with Afib. PT is recommending home health PT.  Patient has HTA insurance. TOC referred out. Lanae with [facility] accepted the referral.  Orders has been placed.    Expected Discharge Plan: [place] Barriers to Discharge: Continued Medical Work up  Expected Discharge Plan and Services Expected Discharge Plan: [place] Arranged: PT   Date [place]: 06/13/19 Time [place]: 1200 Representative spoke with at [place]: Romualdo Bolk

## 2019-06-14 NOTE — Plan of Care (Signed)

## 2019-06-14 NOTE — Progress Notes (Addendum)
Subjective:  Pain medication helped her abdominal pain last night. Woke up a couple of times with pain. Notes the right mid abdominal pain is constant but usually milder. Then has flares with bulging in the area associated with intense pain which then radiates across the abdomen. She is convinced her hernia is causing the problem. Worse with meals, worse with lifting and pulling. She also notes stress worsens her lower abdominal pain. Notes chronic constipation and occasionally takes a laxative at home but could benefit from something on regular basis.   Objective: Vital signs in last 24 hours: Temp:  [98.2 F (36.8 C)-98.6 F (37 C)] 98.4 F (36.9 C) (05/27 0448) Pulse Rate:  [79-98] 83 (05/27 0448) Resp:  [14-21] 18 (05/27 0448) BP: (106-134)/(69-94) 119/69 (05/27 0448) SpO2:  [95 %-100 %] 97 % (05/27 0448) Last BM Date: 06/10/19 General:   Alert,  Well-developed, well-nourished, pleasant and cooperative in NAD Head:  Normocephalic and atraumatic. Eyes:  Sclera clear, no icterus.  Abdomen:  Soft,  Nondistended. Mild right mid abd tenderness. Normal bowel sounds, without guarding, and without rebound.   Extremities:  Without clubbing, deformity or edema. Neurologic:  Alert and  oriented x4;  grossly normal neurologically. Skin:  Intact without significant lesions or rashes. Psych:  Alert and cooperative. Normal mood and affect.  Intake/Output from previous day: 05/26 0701 - 05/27 0700 In: 540 [P.O.:240; I.V.:300] Out: 500 [Urine:500] Intake/Output this shift: No intake/output data recorded.  Lab Results: CBC Recent Labs    06/12/19 0543 06/13/19 0553 06/14/19 0457  WBC 5.8 6.3 6.9  HGB 7.9* 8.6* 8.3*  HCT 26.8* 29.4* 28.4*  MCV 78.8* 79.2* 78.5*  PLT 243 258 261   BMET Recent Labs    06/12/19 0543 06/14/19 0457  NA 136 139  K 3.6 4.3  CL 102 103  CO2 26 27  GLUCOSE 92 102*  BUN 17 13  CREATININE 1.18* 1.24*  CALCIUM 8.8* 9.1   LFTs No results for input(s):  BILITOT, BILIDIR, IBILI, ALKPHOS, AST, ALT, PROT, ALBUMIN in the last 72 hours. No results for input(s): LIPASE in the last 72 hours. PT/INR No results for input(s): LABPROT, INR in the last 72 hours.    Imaging Studies: MR BRAIN WO CONTRAST  Result Date: 06/12/2019 CLINICAL DATA:  Focal neuro deficit greater than 6 hours. Altered mental status 2 months. EXAM: MRI HEAD WITHOUT CONTRAST TECHNIQUE: Multiplanar, multiecho pulse sequences of the brain and surrounding structures were obtained without intravenous contrast. COMPARISON:  MRI head 01/16/2009 FINDINGS: Brain: Image quality degraded by artifact. New peers to be metal artifact from earrings. Mild atrophy. Negative for acute infarct. Diffusion-weighted imaging is significantly degraded by motion. Chronic microvascular ischemic changes are mild but have progressed since the prior study. Gradient echo imaging significantly rated by artifact. Negative for mass or edema. Vascular: Normal arterial flow voids Skull and upper cervical spine: No focal skeletal lesion Sinuses/Orbits: Mild mucosal edema paranasal sinuses. Right mastoid effusion. Bilateral cataract extraction Other: None IMPRESSION: No acute abnormality.  Mild chronic microvascular ischemic change Image quality degraded by artifact from earrings. Electronically Signed   By: Franchot Gallo M.D.   On: 06/12/2019 13:55   MR CERVICAL SPINE WO CONTRAST  Result Date: 06/12/2019 CLINICAL DATA:  Chronic neck pain. Cervical fusion. Right-sided weakness. EXAM: MRI CERVICAL SPINE WITHOUT CONTRAST TECHNIQUE: Multiplanar, multisequence MR imaging of the cervical spine was performed. No intravenous contrast was administered. COMPARISON:  Cervical radiographs 03/13/2019 FINDINGS: Alignment: Mild retrolisthesis C2-3. Vertebrae: Negative for fracture or  mass. ACDF with hardware C3 through C7. Hardware causes local artifact. Cord: No cord signal abnormality. Posterior Fossa, vertebral arteries, paraspinal  tissues: Negative Disc levels: C2-3: Disc degeneration with diffuse endplate spurring. Cord flattening with moderate spinal stenosis. Neural foramina patent bilaterally C3-4: ACDF.  Negative for stenosis C4-5: ACDF.  Negative for stenosis C5-6: ACDF.  Negative for stenosis C6-7: ACDF.  Negative for stenosis C7-T1: Disc degeneration with diffuse endplate spurring and bilateral facet hypertrophy. Moderate foraminal encroachment bilaterally due to spurring. IMPRESSION: ACDF C3 through C7 without stenosis Disc degeneration and spurring at C3-4 causing moderate spinal stenosis bilaterally Moderate foraminal encroachment bilaterally C7-T1 due to spurring. Electronically Signed   By: Franchot Gallo M.D.   On: 06/12/2019 13:59   CT Abdomen Pelvis W Contrast  Result Date: 06/09/2019 CLINICAL DATA:  Generalized abdominal pain for several hours EXAM: CT ABDOMEN AND PELVIS WITH CONTRAST TECHNIQUE: Multidetector CT imaging of the abdomen and pelvis was performed using the standard protocol following bolus administration of intravenous contrast. CONTRAST:  34mL OMNIPAQUE IOHEXOL 300 MG/ML  SOLN COMPARISON:  None. FINDINGS: Lower chest: No acute abnormality. Hepatobiliary: No focal liver abnormality is seen. Status post cholecystectomy. No biliary dilatation. Pancreas: Unremarkable. No pancreatic ductal dilatation or surrounding inflammatory changes. Spleen: Normal in size without focal abnormality. Adrenals/Urinary Tract: Adrenal glands are within normal limits. Kidneys demonstrate a normal enhancement pattern. No obstructive changes are seen. No renal calculi are noted. The bladder is well distended. Stomach/Bowel: Appendix has been surgically removed. No obstructive or inflammatory changes of the colon or small bowel are seen. Postsurgical changes in the stomach are noted consistent with gastric bypass. Vascular/Lymphatic: Aortic atherosclerosis. No enlarged abdominal or pelvic lymph nodes. Reproductive: Status post  hysterectomy. No adnexal masses. Other: No abdominal wall hernia or abnormality. No abdominopelvic ascites. Musculoskeletal: Degenerative changes of lumbar spine are noted. Postsurgical changes are seen at L4-5. IMPRESSION: Postsurgical changes without acute abnormality. Electronically Signed   By: Inez Catalina M.D.   On: 06/09/2019 14:05   DG Chest Port 1 View  Result Date: 06/09/2019 CLINICAL DATA:  74 year old female with a history of dyspnea EXAM: PORTABLE CHEST 1 VIEW COMPARISON:  04/20/2012 FINDINGS: The cardiac border appears slightly more prominent than the most recent comparison, potentially accentuated by apical lordotic positioning. Calcifications of the aortic arch. No pneumothorax or pleural effusion. No confluent airspace disease. Coarsened interstitial markings similar to the prior. No displaced fracture. Surgical changes of the cervical region. IMPRESSION: Negative for acute cardiopulmonary disease. Electronically Signed   By: Corrie Mckusick D.O.   On: 06/09/2019 12:04   ECHOCARDIOGRAM COMPLETE  Result Date: 06/10/2019    ECHOCARDIOGRAM REPORT   Patient Name:   URSULA SINEATH Date of Exam: 06/10/2019 Medical Rec #:  [id number]    Height:       65.0 in Accession #:    [id number]   Weight:       220.0 lb Date of Birth:  1945/02/13    BSA:          2.060 m Patient Age:    74 years     BP:           113/73 mmHg Patient Gender: F            HR:           83 bpm. Exam Location:  [facility] Procedure: 2D Echo Indications:    atrial fibrillation  History:        Patient has prior history of Echocardiogram  examinations, most                 recent 05/25/2012.  Sonographer:    Johny Chess RDCS Referring Phys: [phone] DAVID TAT IMPRESSIONS  1. Left ventricular ejection fraction, by estimation, is 55 to 60%. The left ventricle has normal function. The left ventricle has no regional wall motion abnormalities. There is mild concentric left ventricular hypertrophy. Left ventricular diastolic parameters are  indeterminate.  2. Right ventricular systolic function is normal. The right ventricular size is normal. There is normal pulmonary artery systolic pressure.  3. Left atrial size was severely dilated.  4. Right atrial size was mildly dilated.  5. The mitral valve is degenerative. Mild mitral valve regurgitation.  6. The aortic valve is tricuspid. Aortic valve regurgitation is not visualized. No aortic stenosis is present.  7. The inferior vena cava is dilated in size with >50% respiratory variability, suggesting right atrial pressure of 8 mmHg. FINDINGS  Left Ventricle: Left ventricular ejection fraction, by estimation, is 55 to 60%. The left ventricle has normal function. The left ventricle has no regional wall motion abnormalities. The left ventricular internal cavity size was normal in size. There is  mild concentric left ventricular hypertrophy. Left ventricular diastolic parameters are indeterminate. Right Ventricle: The right ventricular size is normal. No increase in right ventricular wall thickness. Right ventricular systolic function is normal. There is normal pulmonary artery systolic pressure. The tricuspid regurgitant velocity is 2.38 m/s, and  with an assumed right atrial pressure of 8 mmHg, the estimated right ventricular systolic pressure is [id number] mmHg. Left Atrium: Left atrial size was severely dilated. Right Atrium: Right atrial size was mildly dilated. Pericardium: There is no evidence of pericardial effusion. Mitral Valve: The mitral valve is degenerative in appearance. There is mild thickening of the mitral valve leaflet(s). Mild mitral annular calcification. Mild mitral valve regurgitation. Tricuspid Valve: The tricuspid valve is grossly normal. Tricuspid valve regurgitation is mild. Aortic Valve: The aortic valve is tricuspid. . There is mild thickening and mild calcification of the aortic valve. Aortic valve regurgitation is not visualized. No aortic stenosis is present. Mild aortic valve annular  calcification. There is mild thickening of the aortic valve. There is mild calcification of the aortic valve. Pulmonic Valve: The pulmonic valve was grossly normal. Pulmonic valve regurgitation is not visualized. Aorta: The aortic root is normal in size and structure. Venous: The inferior vena cava is dilated in size with greater than 50% respiratory variability, suggesting right atrial pressure of 8 mmHg. IAS/Shunts: No atrial level shunt detected by color flow Doppler.  LEFT VENTRICLE PLAX 2D LVIDd:         4.89 cm LVIDs:         3.88 cm LV PW:         1.02 cm LV IVS:        1.18 cm  RIGHT VENTRICLE             IVC RV S prime:     12.10 cm/s  IVC diam: 2.25 cm LEFT ATRIUM             Index       RIGHT ATRIUM           Index LA diam:        5.10 cm 2.48 cm/m  RA Area:     17.10 cm LA Vol (A2C):   65.0 ml 31.55 ml/m RA Volume:   42.20 ml  20.49 ml/m LA Vol (A4C):  87.3 ml 42.38 ml/m LA Biplane Vol: 80.4 ml 39.03 ml/m  AORTIC VALVE LVOT Vmax:   75.20 cm/s LVOT Vmean:  51.400 cm/s LVOT VTI:    0.155 m  AORTA Ao Root diam: 3.20 cm Ao Asc diam:  3.40 cm TRICUSPID VALVE TR Peak grad:   22.7 mmHg TR Vmax:        238.00 cm/s  SHUNTS Systemic VTI: 0.16 m Kate Sable MD Electronically signed by Kate Sable MD Signature Date/Time: 06/10/2019/1:43:49 PM    Final   [2 weeks]   Assessment: 74 year old female with remote history of gastric bypass in 2007, history of IDA, anastomotic ulcer in 2016, h/o right lateral abdominal wall hernia, h/o small right sided Spigelian hernia without bowel involvement (reviewed with radiology on 03/2019 CT), recent inpatient admission March 2021 with abdominal pain status post EGD with 2 cm ulcer crater at the anastomosis with smaller satellite areas of ulceration in the setting of NSAID/Goody powders but no bleeding stigmata. Patient evaluated by Dr. Constance Haw back in March while inpatient, she did not feel there was an indication for surgery. Admitted this time with  abdominal pain, intractable vomiting, newly documented A. fib with RVR started on Eliquis this admission.  Abdominal pain: Multiple sites of abdominal pain including right lower quadrant, left lower quadrant.biggest complaint is in the right mid abdomen. Pain typically worse with movement. Consistently complains of bulging in the right mid abdomen with associated pain. She has known small spigelian hernia seen on previous imaging studies. Upper abdominal pain worse with meals. Associated nausea and vomiting without hematemesis. She also has chronic back pain. CT this admission with no acute findings. No appreciable abdominal hernia although previously noted on prior CTs.Roseanne Kaufman, NP reviewed CT with radiologist, Dr. Golden Circle who stated there was no evidence for internal hernia and did not appreciate abdominal wall hernia on this exam. Considering multiple abdominal surgeries, she may also have pain related to scar tissue/adhesions. EGD yesterday showed prior gastric bypass surgery with Billroth II type anatomy. Otherwise normal-appearing residual upper GI tract. Nothing to explain her pain on EGD.  GERD: Poorly controlled on Protonix 40 mg twice daily as an outpatient. Breakthrough symptoms several times per week. No longer on NSAIDs. EGD updated as outlined above. Symptoms likely related to dietary indiscretions.  Anemia: History of IDA in setting of previous gastric bypass. Used to see hematology for iron infusions (looks like several years ago was last evaluation). Hemoglobin in the 8-9 range since March. Ferritin 02 April 2019. EGD in March with 2 cm ulcer crater at anastomosis as outlined in the setting of NSAIDs/Goody powders. She has since continued NSAIDs/aspirin powders. No overt GI bleeding. Has maintained PPI twice daily. EGD this admission showed previous gastric bypass surgery changes but otherwise no abnormalities. Now on Eliquis for A. fib. Hemoglobin has remained fairly stable this admission  with no requirements for blood transfusions. Updated anemia panel with ferritin of 5, iron 20, iron sats 5%. She would benefit from follow-up with hematology for ongoing iron infusions.  Constipation: chronic. Intermittent laxatives at home. Would recommend daily regimen.   Plan: 1. Consider outpatient evaluation with hematology for chronic IDA and need for iron infusions. 2. Referral to bariatric surgeon as outpatient.  3. Continue PPI BID. 4. Low residue/bariatric type diet.  5. Continue generic miralax one capful daily at home. Patient reports she did not take after last discharge, possibly due to lack of insurance coverage. Suggested buying generic OTC if needed.   Laureen Ochs. Bobby Rumpf,  [facility] [phone] 5/27/202111:03 AM  Attending note: Discussed with Dr. Roderic Palau briefly last evening.  Agree with assessment and recommendations as outlined.   LOS: 4 days

## 2019-06-15 ENCOUNTER — Telehealth: Payer: Self-pay | Admitting: *Deleted

## 2019-06-15 DIAGNOSIS — R197 Diarrhea, unspecified: Secondary | ICD-10-CM

## 2019-06-15 MED ORDER — OXYCODONE-ACETAMINOPHEN 5-325 MG PO TABS: 1.0000 | ORAL_TABLET | Freq: Four times a day (QID) | ORAL | 0 refills | Status: DC | PRN

## 2019-06-15 MED ORDER — APIXABAN 5 MG PO TABS
5.0000 mg | ORAL_TABLET | Freq: Two times a day (BID) | ORAL | Status: DC
  Administered 2019-06-15: 5 mg via ORAL
  Filled 2019-06-15: qty 1

## 2019-06-15 MED ORDER — DILTIAZEM HCL ER COATED BEADS 120 MG PO CP24: 120.0000 mg | ORAL_CAPSULE | Freq: Every day | ORAL | 0 refills | Status: DC

## 2019-06-15 MED ORDER — PANTOPRAZOLE SODIUM 40 MG PO TBEC: 40.0000 mg | DELAYED_RELEASE_TABLET | Freq: Two times a day (BID) | ORAL | 1 refills | Status: DC

## 2019-06-15 MED ORDER — ONDANSETRON 4 MG PO TBDP
4.0000 mg | ORAL_TABLET | Freq: Three times a day (TID) | ORAL | 0 refills | Status: DC | PRN
Start: 2019-06-15 — End: 2019-08-14

## 2019-06-15 MED ORDER — POLYETHYLENE GLYCOL 3350 17 G PO PACK: 17.0000 g | PACK | Freq: Every day | ORAL | 0 refills | Status: DC

## 2019-06-15 MED ORDER — APIXABAN 5 MG PO TABS: 5.0000 mg | ORAL_TABLET | Freq: Two times a day (BID) | ORAL | 0 refills | Status: DC

## 2019-06-15 NOTE — Progress Notes (Signed)
Physical Therapy Treatment Patient Details Name: [name] MRN: [id number] DOB: 06/20/1945 Today's Date: 06/15/2019    History of Present Illness [name] is a 74 y.o. female with medical history of GERD, hyperlipidemia, hypothyroidism, gastric bypass, hypertension, peptic ulcer disease, anxiety presenting with abdominal pain that woke her up from sleep in early a.m. 06/09/2019.  She stated that it is a periumbilical and right-sided.  She states that she has had intermittent abdominal pain in the same area since her gastric bypass surgery in 2008.  She cannot clarify any alleviating or aggravating factors.  Nonetheless, patient had worsening of her abdominal pain waking her up from sleep as discussed above.  She has chronic "retching and dry heaving" which she states has been unchanged.  She denies any hematemesis.  She denies any fevers, chills, diarrhea, hematochezia melena, dysuria, hematuria.  The patient has been having some shortness of breath at least for the last 6 months, worse with exertion.  She occasionally feels her heart fluttering during the same period of time.  She states that she has some intermittent chest discomfort lasting 2 to 5 minutes.  She is unable to clarify any exacerbating or alleviating factors.  She states that this is been going on for this last 6 months.Notably, the patient was admitted to the hospital from 04/03/2019 to 04/05/2019 during which time she was worked up for hematemesis.  EGD at that time showed a large anastomotic ulcer with smaller satellite ulcers.  It was felt that this may been related to her NSAID use the patient was discharged home with PPI twice daily and Carafate.In the emergency department, patient was afebrile hemodynamically stable.  Initial heart rate was in the 140-150 range.  The patient was started on diltiazem drip.  EKG showed atrial fibrillation/flutter with nonspecific ST-T wave changes.  Chest x-ray was negative for edema or consolidation.   CT of the abdomen pelvis showed postoperative changes without any acute findings.  The patient was started on diltiazem drip and admitted for further evaluation.    PT Comments    Patient agreeable to participating in PT session today. Patient reports she has been feeling lightheaded this morning and she can feel it when her heart is "acting funny." Patient showed increased endurance for activity ambulating 120 feet with RW. Patient performed therapeutic exercises in chair and agreed to remain in chair at end of session. A pillow was placed behind her to accommodate for her stature and support her back. Patient reported that felt better on her spine.     Follow Up Recommendations  Home health PT     Equipment Recommendations  Rolling walker with 5" wheels    Recommendations for Other Services       Precautions / Restrictions Precautions Precautions: Fall Restrictions Weight Bearing Restrictions: No    Mobility  Bed Mobility               General bed mobility comments: up in chair at beginning and end of session  Transfers Overall transfer level: Needs assistance Equipment used: Rolling walker (2 wheeled) Transfers: Sit to/from Stand;Stand Pivot Transfers Sit to Stand: Supervision Stand pivot transfers: Supervision       General transfer comment: supervision largely due to tele and complaints of lightheadedness  Ambulation/Gait Ambulation/Gait assistance: Min guard Gait Distance (Feet): 120 Feet Assistive device: Rolling walker (2 wheeled) Gait Pattern/deviations: Step-through pattern;Decreased step length - right;Decreased step length - left;Decreased stance time - left;Trunk flexed Gait velocity: decreased   General  Gait Details: somewhat slow, labored gait with RW with minimal trunk flexion; limited by fatigue and complaints of lightheadedness.   Stairs             Wheelchair Mobility    Modified Rankin (Stroke Patients Only)       Balance  Overall balance assessment: Needs assistance Sitting-balance support: Feet supported;No upper extremity supported Sitting balance-Leahy Scale: Good Sitting balance - Comments: seated in recliner   Standing balance support: Bilateral upper extremity supported;During functional activity Standing balance-Leahy Scale: Fair Standing balance comment: fair w/ RW                            Cognition Arousal/Alertness: Awake/alert Behavior During Therapy: WFL for tasks assessed/performed Overall Cognitive Status: Within Functional Limits for tasks assessed                                        Exercises General Exercises - Upper Extremity Shoulder Flexion: AROM;Strengthening;Both;10 reps;Seated General Exercises - Lower Extremity Gluteal Sets: Strengthening;Both;10 reps;Seated Long Arc Quad: AROM;Strengthening;Both;10 reps;Seated Hip Flexion/Marching: AROM;Strengthening;Both;10 reps;Seated Toe Raises: AROM;Strengthening;Both;10 reps;Seated Heel Raises: AROM;Strengthening;Both;10 reps;Seated    General Comments        Pertinent Vitals/Pain Pain Assessment: 0-10 Pain Score: 7  Pain Location: low to mid back around lower rib cage Pain Descriptors / Indicators: Aching Pain Intervention(s): Limited activity within patient's tolerance;Monitored during session;Premedicated before session    Home Living                      Prior Function            PT Goals (current goals can now be found in the care plan section) Acute Rehab PT Goals Patient Stated Goal: return home with family to assist PT Goal Formulation: With patient Time For Goal Achievement: 06/15/19 Potential to Achieve Goals: Good Progress towards PT goals: Progressing toward goals    Frequency    Min 3X/week      PT Plan Current plan remains appropriate    Co-evaluation              AM-PAC PT "6 Clicks" Mobility   Outcome Measure  Help needed turning from your  back to your side while in a flat bed without using bedrails?: None Help needed moving from lying on your back to sitting on the side of a flat bed without using bedrails?: None Help needed moving to and from a bed to a chair (including a wheelchair)?: A Little Help needed standing up from a chair using your arms (e.g., wheelchair or bedside chair)?: None Help needed to walk in hospital room?: A Little Help needed climbing 3-5 steps with a railing? : A Little 6 Click Score: 21    End of Session   Activity Tolerance: Patient tolerated treatment well;Patient limited by fatigue Patient left: in chair;with call bell/phone within reach Nurse Communication: Mobility status PT Visit Diagnosis: Unsteadiness on feet (R26.81);Other abnormalities of gait and mobility (R26.89);Muscle weakness (generalized) (M62.81)     Time: [id number] PT Time Calculation (min) (ACUTE ONLY): 30 min  Charges:  $Gait Training: 8-22 mins $Therapeutic Exercise: 8-22 mins                     Floria Raveling. Hartnett-Rands, MS, PT Per [place] [phone] 06/15/2019, 9:27 AM

## 2019-06-15 NOTE — Discharge Summary (Signed)
Physician Discharge Summary  [name] [id number] DOB: [birth year] DOA: 06/09/2019  PCP: Lemmie Evens, MD  Admit date: 06/09/2019 Discharge date: 06/15/2019  Admitted From: Home Disposition: Home   Recommendations for Outpatient Follow-up:  1. Follow up with PCP in 1-2 weeks 2. Please obtain BMP/CBC in one week 3. Outpatient follow up with cardiology has been scheduled 4. She has been referred to [facility] [place] [facility] and will be contacted with an appointment 5. She will follow up with neurosurgery Dr. Arnoldo Morale as previously scheduled  Home Health: Equipment/Devices:  Discharge Condition:stable CODE STATUS: full code Diet recommendation: full liquid bariatric diet  Brief/Interim Summary: 74 y.o.femalewith medical history ofGERD, hyperlipidemia, hypothyroidism, gastric bypass, hypertension, peptic ulcer disease, anxiety presenting with abdominal pain that woke her up from sleep in early a.m. 06/09/2019. She stated that it is a periumbilical and right-sided. She states that she has had intermittent abdominal pain in the same area since her gastric bypass surgery in 2008. She cannot clarify any alleviating or aggravating factors. Nonetheless, patient had worsening of her abdominal pain waking her up from sleep as discussed above. She has chronic "retching and dry heaving" which she states has been unchanged. She denies any hematemesis. She denies any fevers, chills, diarrhea, hematochezia melena, dysuria, hematuria. The patient has been having some shortness of breath at least for the last 6 months, worse with exertion. She occasionally feels her heart fluttering during the same period of time. She states that she has some intermittent chest discomfort lasting 2 to 5 minutes. She is unable to clarify any exacerbating or alleviating factors. She states that this is been going on for this last 6 months. Notably, the patient was admitted to the hospital from  04/03/2019 to 04/05/2019 during which time she was worked up for hematemesis. EGD at that time showed a large anastomotic ulcer with smaller satellite ulcers. It was felt that this may been related to her NSAID use the patient was discharged home with PPI twice daily and Carafate. In the emergency department, patient was afebrile hemodynamically stable. Initial heart rate was in the 140-150 range. The patient was started on diltiazem drip. EKG showed atrial fibrillation/flutter with nonspecific ST-T wave changes. Chest x-ray was negative for edema or consolidation. CT of the abdomen pelvis showed postoperative changes without any acute findings. The patient was started on diltiazem drip and admitted for further evaluation.  Discharge Diagnoses:  Active Problems:   Essential hypertension   Atrial fibrillation with RVR (HCC)   Intractable nausea and vomiting   Sensory disturbance   Persistent atrial fibrillation (HCC)   Right sided abdominal pain  Atrial fibrillation with RVR, type unspecified -IVdiltiazem>>>po diltiazem CD, heart rate is now stable -Started IV heparin>>>apixaban. -Discussed with cardiology with recommendations to hold anticoagulation until after her spine surgery next week -Echocardiogram--EF 55-60%, no WMA, mild MR/TR -TSH--4.005 -CHADS-VASc = 4 (HTN, age, ASVD, female) -appreciate cardiology -now rate controlled  Nausea and vomiting, with history of gastric bypass surgery -Patient had gastric bypass surgery in 2007 and has not followed up with a bariatric surgeon in over 10 years -Patient states that this has been occurring for the last 6 months -Continue PPI twice daily -GI consultappreciated- -04/04/19--EGD--large anastomotic ulcer with small satellite ulcers -hold apixaban in preparation for EGD on 5/26 EGD 5/26: No obvious ulcers.  She did have esophageal dilatation -Discussed with Dr. Gala Romney with recommendations to advance diet to full liquid bariatric  diet.   -although patient has continued nausea, she seems to be  tolerating liquids -reports symptoms have been present for over 6 months -Discussed with Dr. Gala Romney and there may be concern for internal hernia and recommendations are to follow up with a bariatric surgeon -She has been referred to [facility]   Chronic abdominal pain -Lipase 25 -06/09/2019 CT abdomen/pelvis negative for acute findings -Suspect this may be in part due to the patient's right-sided ventral hernia and constipation -Judicious opioids -UDS--positive opiates -UA--no significant pyuria -GI eval as above -continue cathartics -She has been referred to follow up with bariatrics -symptoms have been longstanding -despite nausea and abd pain, she reports gaining 60lbs in the past year  Right Hand /Right Leg sensory disturbance and weak -06/12/19 MR cervical spine--moderate spinal stenois C3-4 due to disk degeneration and spurring;  Moderate C7-T1 foraminal encroachement -Right hand dysesthesia likely due to carpal tunnel syndrome--pt endorses recent NCV which confirmed carpal tunnel R>L wrist -she is due to have lumbar fusion on 06/20/19 with Dr. Newman Pies -06/12/19--MR brain--no acute findings  Hyperglycemia/Impaired glucose tolerance -Check hemoglobin A1c--6.2  Hypothyroidism -Continue levothyroxine  Depression/anxiety -Continue Effexor  CKD stage 3b -baseline creatinine 1.2-1.4 -am BMP  Preop cardiac clearance: -Patient was seen in the hospital by Dr. Domenic Polite. -I followed up with Dr. Bronson Ing today who is covering for Dr. Domenic Polite -He felt that anticoagulation could be held until after spine surgery next week -He also recommended that preop form be faxed to their office and cardiology would complete pre-op clearance for surgery  Discharge Instructions  Discharge Instructions    Diet - low sodium heart healthy   Complete by: As directed    Increase activity slowly    Complete by: As directed      Allergies as of 06/15/2019      Reactions   Benzodiazepines Other (See Comments)   Cloudy thinking, memory loss, withdrawal symptoms when trying to stop without any other medication for detox.    Codeine Nausea Only   Escitalopram Oxalate Nausea Only   Nsaids Other (See Comments)   Bleeding ulcer    Sertraline Hcl Nausea Only   Statins Nausea And Vomiting   Tramadol Hcl Other (See Comments)   Daughter doesn't want mother to take due to the fact that the medication made the daughter have a seizure       Medication List    STOP taking these medications   ondansetron 4 MG tablet Commonly known as: ZOFRAN   traMADol 50 MG tablet Commonly known as: ULTRAM     TAKE these medications   apixaban 5 MG Tabs tablet Commonly known as: ELIQUIS Take 1 tablet (5 mg total) by mouth 2 (two) times daily. Start this medication after back surgery   diclofenac Sodium 1 % Gel Commonly known as: VOLTAREN Apply 2 g topically 4 (four) times daily.   diltiazem 120 MG 24 hr capsule Commonly known as: CARDIZEM CD Take 1 capsule (120 mg total) by mouth daily. Start taking on: Jun 16, 2019   furosemide 40 MG tablet Commonly known as: LASIX Take 40 mg by mouth as needed.   levothyroxine 75 MCG tablet Commonly known as: SYNTHROID Take 75 mcg by mouth daily before breakfast.   ondansetron 4 MG disintegrating tablet Commonly known as: Zofran ODT Take 1 tablet (4 mg total) by mouth every 8 (eight) hours as needed for nausea or vomiting.   oxyCODONE-acetaminophen 5-325 MG tablet Commonly known as: Percocet Take 1 tablet by mouth every 6 (six) hours as needed for severe pain. What changed:  how much to take  when to take this   pantoprazole 40 MG tablet Commonly known as: PROTONIX Take 1 tablet (40 mg total) by mouth 2 (two) times daily before a meal.   polyethylene glycol 17 g packet Commonly known as: MIRALAX / GLYCOLAX Take 17 g by mouth daily. What  changed:   when to take this  reasons to take this   sucralfate 1 GM/10ML suspension Commonly known as: CARAFATE Take 10 mLs (1 g total) by mouth 4 (four) times daily -  with meals and at bedtime for 10 days.   venlafaxine 75 MG tablet Commonly known as: EFFEXOR Take 75 mg by mouth 2 (two) times daily.   VITAMIN 0000000 IJ Inject 1 Applicatorful as directed every 30 (thirty) days.   Vitamin D (Ergocalciferol) 1.25 MG (50000 UNIT) Caps capsule Commonly known as: DRISDOL Take 50,000 Units by mouth every 7 (seven) days. Fridays      Follow-up Information    Verta Ellen., NP Follow up on 07/04/2019.   Specialty: Cardiology Why: Cardiology Hospital Follow-up on 07/04/2019 at 2:00 PM.  Contact information: [place] [address] [place] Follow up.   Specialty: Home Health Services Why:  PT       Newman Pies, MD Follow up.   Specialty: Neurosurgery Why: follow up with Dr. Arnoldo Morale next week as previously scheduled for surgery Contact information: [address] [place] [address] [phone]        [facility], [place] Follow up.   Specialty: General Surgery Why: You have been referred to a bariatric surgeon. You will be contacted with an appointment Contact information: [address] [place] [address] [phone]          Allergies  Allergen Reactions  . Benzodiazepines Other (See Comments)    Cloudy thinking, memory loss, withdrawal symptoms when trying to stop without any other medication for detox.   . Codeine Nausea Only  . Escitalopram Oxalate Nausea Only  . Nsaids Other (See Comments)    Bleeding ulcer   . Sertraline Hcl Nausea Only  . Statins Nausea And Vomiting  . Tramadol Hcl Other (See Comments)    Daughter doesn't want mother to take due to the fact that the medication made the daughter have a seizure      Consultations:  GI   Procedures/Studies: MR BRAIN WO CONTRAST  Result Date: 06/12/2019 CLINICAL DATA:  Focal neuro deficit greater than 6 hours. Altered mental status 2 months. EXAM: MRI HEAD WITHOUT CONTRAST TECHNIQUE: Multiplanar, multiecho pulse sequences of the brain and surrounding structures were obtained without intravenous contrast. COMPARISON:  MRI head 01/16/2009 FINDINGS: Brain: Image quality degraded by artifact. New peers to be metal artifact from earrings. Mild atrophy. Negative for acute infarct. Diffusion-weighted imaging is significantly degraded by motion. Chronic microvascular ischemic changes are mild but have progressed since the prior study. Gradient echo imaging significantly rated by artifact. Negative for mass or edema. Vascular: Normal arterial flow voids Skull and upper cervical spine: No focal skeletal lesion Sinuses/Orbits: Mild mucosal edema paranasal sinuses. Right mastoid effusion. Bilateral cataract extraction Other: None IMPRESSION: No acute abnormality.  Mild chronic microvascular ischemic change Image quality degraded by artifact from earrings. Electronically Signed   By: Franchot Gallo M.D.   On: 06/12/2019 13:55   MR CERVICAL SPINE WO CONTRAST  Result Date: 06/12/2019 CLINICAL DATA:  Chronic neck  pain. Cervical fusion. Right-sided weakness. EXAM: MRI CERVICAL SPINE WITHOUT CONTRAST TECHNIQUE: Multiplanar, multisequence MR imaging of the cervical spine was performed. No intravenous contrast was administered. COMPARISON:  Cervical radiographs 03/13/2019 FINDINGS: Alignment: Mild retrolisthesis C2-3. Vertebrae: Negative for fracture or mass. ACDF with hardware C3 through C7. Hardware causes local artifact. Cord: No cord signal abnormality. Posterior Fossa, vertebral arteries, paraspinal tissues: Negative Disc levels: C2-3: Disc degeneration with diffuse endplate spurring. Cord flattening with moderate spinal stenosis. Neural foramina patent bilaterally C3-4: ACDF.   Negative for stenosis C4-5: ACDF.  Negative for stenosis C5-6: ACDF.  Negative for stenosis C6-7: ACDF.  Negative for stenosis C7-T1: Disc degeneration with diffuse endplate spurring and bilateral facet hypertrophy. Moderate foraminal encroachment bilaterally due to spurring. IMPRESSION: ACDF C3 through C7 without stenosis Disc degeneration and spurring at C3-4 causing moderate spinal stenosis bilaterally Moderate foraminal encroachment bilaterally C7-T1 due to spurring. Electronically Signed   By: Franchot Gallo M.D.   On: 06/12/2019 13:59   CT Abdomen Pelvis W Contrast  Result Date: 06/09/2019 CLINICAL DATA:  Generalized abdominal pain for several hours EXAM: CT ABDOMEN AND PELVIS WITH CONTRAST TECHNIQUE: Multidetector CT imaging of the abdomen and pelvis was performed using the standard protocol following bolus administration of intravenous contrast. CONTRAST:  68mL OMNIPAQUE IOHEXOL 300 MG/ML  SOLN COMPARISON:  None. FINDINGS: Lower chest: No acute abnormality. Hepatobiliary: No focal liver abnormality is seen. Status post cholecystectomy. No biliary dilatation. Pancreas: Unremarkable. No pancreatic ductal dilatation or surrounding inflammatory changes. Spleen: Normal in size without focal abnormality. Adrenals/Urinary Tract: Adrenal glands are within normal limits. Kidneys demonstrate a normal enhancement pattern. No obstructive changes are seen. No renal calculi are noted. The bladder is well distended. Stomach/Bowel: Appendix has been surgically removed. No obstructive or inflammatory changes of the colon or small bowel are seen. Postsurgical changes in the stomach are noted consistent with gastric bypass. Vascular/Lymphatic: Aortic atherosclerosis. No enlarged abdominal or pelvic lymph nodes. Reproductive: Status post hysterectomy. No adnexal masses. Other: No abdominal wall hernia or abnormality. No abdominopelvic ascites. Musculoskeletal: Degenerative changes of lumbar spine are noted. Postsurgical  changes are seen at L4-5. IMPRESSION: Postsurgical changes without acute abnormality. Electronically Signed   By: Inez Catalina M.D.   On: 06/09/2019 14:05   DG Chest Port 1 View  Result Date: 06/09/2019 CLINICAL DATA:  74 year old female with a history of dyspnea EXAM: PORTABLE CHEST 1 VIEW COMPARISON:  04/20/2012 FINDINGS: The cardiac border appears slightly more prominent than the most recent comparison, potentially accentuated by apical lordotic positioning. Calcifications of the aortic arch. No pneumothorax or pleural effusion. No confluent airspace disease. Coarsened interstitial markings similar to the prior. No displaced fracture. Surgical changes of the cervical region. IMPRESSION: Negative for acute cardiopulmonary disease. Electronically Signed   By: Corrie Mckusick D.O.   On: 06/09/2019 12:04   ECHOCARDIOGRAM COMPLETE  Result Date: 06/10/2019    ECHOCARDIOGRAM REPORT   Patient Name:   KENYANA LORA Date of Exam: 06/10/2019 Medical Rec #:  [id number]    Height:       65.0 in Accession #:    [id number]   Weight:       220.0 lb Date of Birth:  Feb 17, 1945    BSA:          2.060 m Patient Age:    74 years     BP:           113/73 mmHg Patient Gender: F  HR:           83 bpm. Exam Location:  [facility] Procedure: 2D Echo Indications:    atrial fibrillation  History:        Patient has prior history of Echocardiogram examinations, most                 recent 05/25/2012.  Sonographer:    Johny Chess RDCS Referring Phys: [phone] DAVID TAT IMPRESSIONS  1. Left ventricular ejection fraction, by estimation, is 55 to 60%. The left ventricle has normal function. The left ventricle has no regional wall motion abnormalities. There is mild concentric left ventricular hypertrophy. Left ventricular diastolic parameters are indeterminate.  2. Right ventricular systolic function is normal. The right ventricular size is normal. There is normal pulmonary artery systolic pressure.  3. Left atrial size was severely  dilated.  4. Right atrial size was mildly dilated.  5. The mitral valve is degenerative. Mild mitral valve regurgitation.  6. The aortic valve is tricuspid. Aortic valve regurgitation is not visualized. No aortic stenosis is present.  7. The inferior vena cava is dilated in size with >50% respiratory variability, suggesting right atrial pressure of 8 mmHg. FINDINGS  Left Ventricle: Left ventricular ejection fraction, by estimation, is 55 to 60%. The left ventricle has normal function. The left ventricle has no regional wall motion abnormalities. The left ventricular internal cavity size was normal in size. There is  mild concentric left ventricular hypertrophy. Left ventricular diastolic parameters are indeterminate. Right Ventricle: The right ventricular size is normal. No increase in right ventricular wall thickness. Right ventricular systolic function is normal. There is normal pulmonary artery systolic pressure. The tricuspid regurgitant velocity is 2.38 m/s, and  with an assumed right atrial pressure of 8 mmHg, the estimated right ventricular systolic pressure is [id number] mmHg. Left Atrium: Left atrial size was severely dilated. Right Atrium: Right atrial size was mildly dilated. Pericardium: There is no evidence of pericardial effusion. Mitral Valve: The mitral valve is degenerative in appearance. There is mild thickening of the mitral valve leaflet(s). Mild mitral annular calcification. Mild mitral valve regurgitation. Tricuspid Valve: The tricuspid valve is grossly normal. Tricuspid valve regurgitation is mild. Aortic Valve: The aortic valve is tricuspid. . There is mild thickening and mild calcification of the aortic valve. Aortic valve regurgitation is not visualized. No aortic stenosis is present. Mild aortic valve annular calcification. There is mild thickening of the aortic valve. There is mild calcification of the aortic valve. Pulmonic Valve: The pulmonic valve was grossly normal. Pulmonic valve  regurgitation is not visualized. Aorta: The aortic root is normal in size and structure. Venous: The inferior vena cava is dilated in size with greater than 50% respiratory variability, suggesting right atrial pressure of 8 mmHg. IAS/Shunts: No atrial level shunt detected by color flow Doppler.  LEFT VENTRICLE PLAX 2D LVIDd:         4.89 cm LVIDs:         3.88 cm LV PW:         1.02 cm LV IVS:        1.18 cm  RIGHT VENTRICLE             IVC RV S prime:     12.10 cm/s  IVC diam: 2.25 cm LEFT ATRIUM             Index       RIGHT ATRIUM           Index LA diam:  5.10 cm 2.48 cm/m  RA Area:     17.10 cm LA Vol (A2C):   65.0 ml 31.55 ml/m RA Volume:   42.20 ml  20.49 ml/m LA Vol (A4C):   87.3 ml 42.38 ml/m LA Biplane Vol: 80.4 ml 39.03 ml/m  AORTIC VALVE LVOT Vmax:   75.20 cm/s LVOT Vmean:  51.400 cm/s LVOT VTI:    0.155 m  AORTA Ao Root diam: 3.20 cm Ao Asc diam:  3.40 cm TRICUSPID VALVE TR Peak grad:   22.7 mmHg TR Vmax:        238.00 cm/s  SHUNTS Systemic VTI: 0.16 m Kate Sable MD Electronically signed by Kate Sable MD Signature Date/Time: 06/10/2019/1:43:49 PM    Final        Subjective:   Discharge Exam: Vitals:   06/15/19 0602 06/15/19 0853 06/15/19 0947 06/15/19 1417  BP: 106/72  131/78 138/74  Pulse: 79   72  Resp: 18   20  Temp: 98.6 F (37 C)   97.8 F (36.6 C)  TempSrc: Oral   Oral  SpO2: 97% 98%  96%  Weight:      Height:        General: Pt is alert, awake, not in acute distress Cardiovascular: RRR, S1/S2 +, no rubs, no gallops Respiratory: CTA bilaterally, no wheezing, no rhonchi Abdominal: Soft, NT, ND, bowel sounds + Extremities: no edema, no cyanosis    The results of significant diagnostics from this hospitalization (including imaging, microbiology, ancillary and laboratory) are listed below for reference.     Microbiology: Recent Results (from the past 240 hour(s))  SARS Coronavirus 2 by RT PCR (hospital order, performed in [facility]  hospital lab) Nasopharyngeal Nasopharyngeal Swab     Status: None   Collection Time: 06/09/19  2:19 PM   Specimen: Nasopharyngeal Swab  Result Value Ref Range Status   SARS Coronavirus 2 NEGATIVE NEGATIVE Final    Comment: (NOTE) SARS-CoV-2 target nucleic acids are NOT DETECTED. The SARS-CoV-2 RNA is generally detectable in upper and lower respiratory specimens during the acute phase of infection. The lowest concentration of SARS-CoV-2 viral copies this assay can detect is 250 copies / mL. A negative result does not preclude SARS-CoV-2 infection and should not be used as the sole basis for treatment or other patient management decisions.  A negative result may occur with improper specimen collection / handling, submission of specimen other than nasopharyngeal swab, presence of viral mutation(s) within the areas targeted by this assay, and inadequate number of viral copies (<250 copies / mL). A negative result must be combined with clinical observations, patient history, and epidemiological information. Fact Sheet for Patients:   StrictlyIdeas.no Fact Sheet for Healthcare Providers: BankingDealers.co.za This test is not yet approved or cleared  by the [place] FDA and has been authorized for detection and/or diagnosis of SARS-CoV-2 by FDA under an Emergency Use Authorization (EUA).  This EUA will remain in effect (meaning this test can be used) for the duration of the COVID-19 declaration under Section 564(b)(1) of the Act, 21 U.S.C. section 360bbb-3(b)(1), unless the authorization is terminated or revoked sooner. Performed at [facility], [address] [place], [place] [address]   MRSA PCR Screening     Status: None   Collection Time: 06/09/19  4:04 PM   Specimen: Nasopharyngeal  Result Value Ref Range Status   MRSA by PCR NEGATIVE NEGATIVE Final    Comment:        The GeneXpert MRSA Assay (FDA approved for NASAL  specimens only), is one component of a comprehensive MRSA colonization surveillance program. It is not intended to diagnose MRSA infection nor to guide or monitor treatment for MRSA infections. Performed at [facility], [address] [place], [place] [address]   Urine culture     Status: Abnormal   Collection Time: 06/09/19  9:00 PM   Specimen: Urine, Clean Catch  Result Value Ref Range Status   Specimen Description   Final    URINE, CLEAN CATCH Performed at [facility], [address], [place], [place] [address]    Special Requests   Final    NONE Performed at [facility], [address] [place], [place] [address]    Culture (A)  Final    <10,000 COLONIES/mL INSIGNIFICANT GROWTH Performed at [place] [facility] [address], [place],  [address]    Report Status 06/11/2019 FINAL  Final     Labs: BNP (last 3 results) No results for input(s): BNP in the last 8760 hours. Basic Metabolic Panel: Recent Labs  Lab 06/09/19 1115 06/10/19 0011 06/11/19 0523 06/12/19 0543 06/14/19 0457  NA 140 142 137 136 139  K 3.5 3.6 3.8 3.6 4.3  CL 107 109 103 102 103  CO2 24 24 24 26 27   GLUCOSE 138* 123* 95 92 102*  BUN 20 18 21 17 13   CREATININE 1.25* 1.07* 1.28* 1.18* 1.24*  CALCIUM 8.9 8.8* 9.2 8.8* 9.1  MG  --  2.0 2.1 2.0  --    Liver Function Tests: Recent Labs  Lab 06/09/19 1115  AST 16  ALT 9  ALKPHOS 61  BILITOT 0.7  PROT 6.5  ALBUMIN 3.8   Recent Labs  Lab 06/09/19 1115  LIPASE 25   No results for input(s): AMMONIA in the last 168 hours. CBC: Recent Labs  Lab 06/09/19 1115 06/09/19 1115 06/10/19 0011 06/11/19 0523 06/12/19 0543 06/13/19 0553 06/14/19 0457  WBC 7.4   < > 6.8 6.1 5.8 6.3 6.9  NEUTROABS 5.4  --   --   --   --   --   --   HGB 9.1*   < > 8.3* 8.4* 7.9* 8.6* 8.3*  HCT 31.2*   < > 28.1* 29.6* 26.8* 29.4* 28.4*  MCV 79.0*   < > 78.9* 80.2 78.8* 79.2* 78.5*  PLT 292   < > 266 261 243 258 261   < > = values in this interval  not displayed.   Cardiac Enzymes: No results for input(s): CKTOTAL, CKMB, CKMBINDEX, TROPONINI in the last 168 hours. BNP: Invalid input(s): POCBNP CBG: No results for input(s): GLUCAP in the last 168 hours. D-Dimer No results for input(s): DDIMER in the last 72 hours. Hgb A1c No results for input(s): HGBA1C in the last 72 hours. Lipid Profile No results for input(s): CHOL, HDL, LDLCALC, TRIG, CHOLHDL, LDLDIRECT in the last 72 hours. Thyroid function studies No results for input(s): TSH, T4TOTAL, T3FREE, THYROIDAB in the last 72 hours.  Invalid input(s): FREET3 Anemia work up No results for input(s): VITAMINB12, FOLATE, FERRITIN, TIBC, IRON, RETICCTPCT in the last 72 hours. Urinalysis    Component Value Date/Time   COLORURINE YELLOW 06/09/2019 2100   APPEARANCEUR HAZY (A) 06/09/2019 2100   LABSPEC 1.033 (H) 06/09/2019 2100   PHURINE 6.0 06/09/2019 2100   GLUCOSEU NEGATIVE 06/09/2019 2100   HGBUR NEGATIVE 06/09/2019 2100   [place] NEGATIVE 06/09/2019 2100   [place] NEGATIVE 06/09/2019 2100   PROTEINUR NEGATIVE 06/09/2019 2100   UROBILINOGEN 0.2 05/09/2012 1741  NITRITE NEGATIVE 06/09/2019 2100   LEUKOCYTESUR LARGE (A) 06/09/2019 2100   Sepsis Labs Invalid input(s): PROCALCITONIN,  WBC,  LACTICIDVEN Microbiology Recent Results (from the past 240 hour(s))  SARS Coronavirus 2 by RT PCR (hospital order, performed in [facility] hospital lab) Nasopharyngeal Nasopharyngeal Swab     Status: None   Collection Time: 06/09/19  2:19 PM   Specimen: Nasopharyngeal Swab  Result Value Ref Range Status   SARS Coronavirus 2 NEGATIVE NEGATIVE Final    Comment: (NOTE) SARS-CoV-2 target nucleic acids are NOT DETECTED. The SARS-CoV-2 RNA is generally detectable in upper and lower respiratory specimens during the acute phase of infection. The lowest concentration of SARS-CoV-2 viral copies this assay can detect is 250 copies / mL. A negative result does not preclude SARS-CoV-2  infection and should not be used as the sole basis for treatment or other patient management decisions.  A negative result may occur with improper specimen collection / handling, submission of specimen other than nasopharyngeal swab, presence of viral mutation(s) within the areas targeted by this assay, and inadequate number of viral copies (<250 copies / mL). A negative result must be combined with clinical observations, patient history, and epidemiological information. Fact Sheet for Patients:   StrictlyIdeas.no Fact Sheet for Healthcare Providers: BankingDealers.co.za This test is not yet approved or cleared  by the [place] FDA and has been authorized for detection and/or diagnosis of SARS-CoV-2 by FDA under an Emergency Use Authorization (EUA).  This EUA will remain in effect (meaning this test can be used) for the duration of the COVID-19 declaration under Section 564(b)(1) of the Act, 21 U.S.C. section 360bbb-3(b)(1), unless the authorization is terminated or revoked sooner. Performed at [facility], [address] [place], [place] [address]   MRSA PCR Screening     Status: None   Collection Time: 06/09/19  4:04 PM   Specimen: Nasopharyngeal  Result Value Ref Range Status   MRSA by PCR NEGATIVE NEGATIVE Final    Comment:        The GeneXpert MRSA Assay (FDA approved for NASAL specimens only), is one component of a comprehensive MRSA colonization surveillance program. It is not intended to diagnose MRSA infection nor to guide or monitor treatment for MRSA infections. Performed at [facility], [address] [place], [place] [address]   Urine culture     Status: Abnormal   Collection Time: 06/09/19  9:00 PM   Specimen: Urine, Clean Catch  Result Value Ref Range Status   Specimen Description   Final    URINE, CLEAN CATCH Performed at [facility], [address] [place], [place] [address]    Special Requests    Final    NONE Performed at [facility], [address], [place], [place] [address]    Culture (A)  Final    <10,000 COLONIES/mL INSIGNIFICANT GROWTH Performed at [facility] [address] [place], [place] [address]    Report Status 06/11/2019 FINAL  Final     Time coordinating discharge: 21mins  SIGNED:   Kathie Dike, MD  [facility] 06/15/2019, 9:26 PM   If 7PM-7AM, please contact night-coverage [email or web address]

## 2019-06-15 NOTE — Telephone Encounter (Signed)
   Primary Cardiologist: Jenkins Rouge, MD  Chart reviewed as part of pre-operative protocol coverage. It appears patient was recently hospitalized 06/09/19-06/15/19 with atrial fibrillation with RVR and was seen by Dr. Domenic Polite. I do not see any notes specifically addressing preoperative status in regards to upcoming spinal procedure. Will route to Dr. Domenic Polite for his input.  Additionally will route to pharmacy for input on holding eliquis prior to this procedure.    Abigail Butts, PA-C 06/15/2019, 4:34 PM

## 2019-06-15 NOTE — Care Management Important Message (Signed)
Important Message  Patient Details  Name: [name] MRN: [id number] Date of Birth: March 13, 1945   Medicare Important Message Given:  Yes     Tommy Medal 06/15/2019, 1:59 PM

## 2019-06-15 NOTE — Telephone Encounter (Addendum)
Patient with diagnosis of afib on Eliquis for anticoagulation.    Procedure: lumbar fusion Date of procedure: 06/20/19  CHADS2-VASc score of 4 (age, sex, HTN, CAD)  CrCl 79mL/min Platelet count 241K  Per office protocol, patient can hold Eliquis for 3 days prior to procedure.

## 2019-06-15 NOTE — Telephone Encounter (Signed)
Our office received a clearance form from [facility]. I called surgeon's office and s/w Lorriane Shire, surgery scheduler and told her that it looks like the pt has not bee seen since 2014. Lorriane Shire stated per Dr. Roderic Palau he s/w Dr. Domenic Polite while the pt was in the hospital. Lorriane Shire states that Dr. Roderic Palau said Dr. Domenic Polite said the pt was cleared and if their office would fax over an official clearance form. I stated to Lorriane Shire I see that Dr. Domenic Polite did see the pt while she was in the hospital. I assured Lorriane Shire I will enter the clearance form and send to our pre op team and if they have any questions they can reach out to Dr. Domenic Polite. Lorriane Shire thanked me for the help.      [facility] Pre-operative Risk Assessment    HEARTCARE STAFF: - Please ensure there is not already an duplicate clearance open for this procedure. - Under Visit Info/Reason for Call, type in Other and utilize the format Clearance MM/DD/YY or Clearance TBD. Do not use dashes or single digits. - If request is for dental extraction, please clarify the # of teeth to be extracted.  Request for surgical clearance: URGENT  1. What type of surgery is being performed? L3-4 LUMBAR FUSION   2. When is this surgery scheduled? 06/20/19   3. What type of clearance is required (medical clearance vs. Pharmacy clearance to hold med vs. Both)? BOTH  4. Are there any medications that need to be held prior to surgery and how long? ELIQUIS   5. Practice name and name of physician performing surgery? [facility]; DR. JEFFREY JENKINS   6. What is the office phone number? [phone]   7.   What is the office fax number? [phone] ATTN: VANESSA X [phone]  8.   Anesthesia type (None, local, MAC, general) ? GENERAL   Julaine Hua 06/15/2019, 4:15 PM  _________________________________________________________________   (provider comments below)

## 2019-06-15 NOTE — Progress Notes (Signed)
Nsg Discharge Note  Admit Date:  06/09/2019 Discharge date: 06/15/2019   [name] to be D/C'd homeper MD order.  AVS completed.  Copy for chart, and copy for patient signed, and dated. Patient/caregiver able to verbalize understanding.  Discharge Medication: Allergies as of 06/15/2019      Reactions   Benzodiazepines Other (See Comments)   Cloudy thinking, memory loss, withdrawal symptoms when trying to stop without any other medication for detox.    Codeine Nausea Only   Escitalopram Oxalate Nausea Only   Nsaids Other (See Comments)   Bleeding ulcer    Sertraline Hcl Nausea Only   Statins Nausea And Vomiting   Tramadol Hcl Other (See Comments)   Daughter doesn't want mother to take due to the fact that the medication made the daughter have a seizure       Medication List    STOP taking these medications   ondansetron 4 MG tablet Commonly known as: ZOFRAN   traMADol 50 MG tablet Commonly known as: ULTRAM     TAKE these medications   apixaban 5 MG Tabs tablet Commonly known as: ELIQUIS Take 1 tablet (5 mg total) by mouth 2 (two) times daily. Start this medication after back surgery   diclofenac Sodium 1 % Gel Commonly known as: VOLTAREN Apply 2 g topically 4 (four) times daily.   diltiazem 120 MG 24 hr capsule Commonly known as: CARDIZEM CD Take 1 capsule (120 mg total) by mouth daily. Start taking on: Jun 16, 2019   furosemide 40 MG tablet Commonly known as: LASIX Take 40 mg by mouth as needed.   levothyroxine 75 MCG tablet Commonly known as: SYNTHROID Take 75 mcg by mouth daily before breakfast.   ondansetron 4 MG disintegrating tablet Commonly known as: Zofran ODT Take 1 tablet (4 mg total) by mouth every 8 (eight) hours as needed for nausea or vomiting.   oxyCODONE-acetaminophen 5-325 MG tablet Commonly known as: Percocet Take 1 tablet by mouth every 6 (six) hours as needed for severe pain. What changed:   how much to take  when to take this    pantoprazole 40 MG tablet Commonly known as: PROTONIX Take 1 tablet (40 mg total) by mouth 2 (two) times daily before a meal.   polyethylene glycol 17 g packet Commonly known as: MIRALAX / GLYCOLAX Take 17 g by mouth daily. What changed:   when to take this  reasons to take this   sucralfate 1 GM/10ML suspension Commonly known as: CARAFATE Take 10 mLs (1 g total) by mouth 4 (four) times daily -  with meals and at bedtime for 10 days.   venlafaxine 75 MG tablet Commonly known as: EFFEXOR Take 75 mg by mouth 2 (two) times daily.   VITAMIN 0000000 IJ Inject 1 Applicatorful as directed every 30 (thirty) days.   Vitamin D (Ergocalciferol) 1.25 MG (50000 UNIT) Caps capsule Commonly known as: DRISDOL Take 50,000 Units by mouth every 7 (seven) days. Fridays       Discharge Assessment: Vitals:   06/15/19 0947 06/15/19 1417  BP: 131/78 138/74  Pulse:  72  Resp:  20  Temp:  97.8 F (36.6 C)  SpO2:  96%   Skin clean, dry and intact without evidence of skin break down, no evidence of skin tears noted. IV catheter discontinued intact. Site without signs and symptoms of complications - no redness or edema noted at insertion site, patient denies c/o pain - only slight tenderness at site.  Dressing with slight  pressure applied.  D/c Instructions-Education: Discharge instructions given to patient/family with verbalized understanding. D/c education completed with patient/family including follow up instructions, medication list, d/c activities limitations if indicated, with other d/c instructions as indicated by MD - patient able to verbalize understanding, all questions fully answered. Patient instructed to return to ED, call 911, or call MD for any changes in condition.  Patient escorted via [place], and D/C home via private auto.  Zachery Conch, RN 06/15/2019 3:31 PM

## 2019-06-15 NOTE — Progress Notes (Signed)
Pt stated she was dizzy and has been since arrival at the hospital she just did not mention it. Pt stated it was worse this morning. Advised pt to call for assistance with ambulation. Pt also stated she was nauseous as a result of the dizziness. PRN medication given (see mar), pain 8/10 prn medication also given

## 2019-06-15 NOTE — TOC Transition Note (Signed)
Transition of Care [facility]) - CM/SW Discharge Note   Patient Details  Name: [name] MRN: [id number] Date of Birth: 24-Jan-1945  Transition of Care [facility]) CM/SW Contact:  Salome Arnt, LCSW Phone Number: 06/15/2019, 2:49 PM   Clinical Narrative:   Pt d/c home today with home health PT. Pt states she will be going to her daughter's house and will not be home until Monday and requests that home health not start until then. LCSW notified Ajanae Fuente of above.     Final next level of care: [place] Barriers to Discharge: Barriers Resolved   Patient Goals and CMS Choice        Discharge Placement                       Discharge Plan and Services                          HH Arranged: PT [facility] Agency: [place] ([facility]) Date HH Agency Contacted: 06/15/19 Time [place]: [phone] Representative spoke with at [place]: [place] ([place]) Interventions     Readmission Risk Interventions No flowsheet data found.

## 2019-06-16 ENCOUNTER — Encounter: Payer: Self-pay | Admitting: Cardiology

## 2019-06-16 NOTE — Telephone Encounter (Signed)
I found this in my inbox on Saturday morning, I was out of the office on Friday afternoon.  We did see Ms. Paczkowski in consultation at [facility] with recently documented persistent atrial fibrillation.  She had experienced chest discomfort with RVR, but the symptoms improved with rate control and she ultimately tolerated Cardizem CD 120 mg daily without further symptoms.  She has no clear history of obstructive CAD based on chart review although does have atherosclerosis, hypertension, and some family history of CAD.  Eliquis was initiated for stroke prophylaxis with CHA2DS2-VASc score of 4, I agree with recommendations to hold this 3 days prior to lumbar spine surgery.  At this point I do not suspect that she needs further ischemic work-up which would result in a delay in surgery.  LVEF is normal by echocardiogram.  To be clear, I was not aware of any pending lumbar spine surgery, this was apparently discussed between Dr. Roderic Palau and Dr. Bronson Ing (see discharge summary) who was actually covering [facility] yesterday afternoon resulting in recommendations to send preop form to the office.

## 2019-06-18 NOTE — Telephone Encounter (Addendum)
   Primary Cardiologist:Peter Johnsie Cancel, MD  Chart reviewed as part of pre-operative protocol coverage (I am covering [facility] PM call, chart flagged by MD for review since procedure is scheduled for 6/2). Pre-op clearance was granted below. However, I called the patient to check on her today and she reports that she still has issues with her heart rate going all over the place any time she gets upset or does any kind of activity - develops fatigue, SOB, chest discomfort. Therefore based on this information I do not feel comfortable clearing her for this procedure and think she needs further cardiac evaluation prior to finalizing clearance. I asked if she felt poorly enough to return to the emergency room and she said yes. She prefers to try and have family transport her up there today, made aware EMS is an option. I told her I would have the office call and check on her tomorrow as well - if she did not go to the ER, needs an appt in the office to be seen. Will route this note to requesting surgeon to make them aware.  Regarding anticoagulation, our pharmacy team reviewed her chart as well as Dr. Domenic Polite who felt patient could holdEliquisfor 3days prior to procedure. Per discharge summary from recent hospital stay, patient was actually advised to hold off starting Eliquis until after her back surgery. Will need to revisit when to resume when patient is seen by our team - note patient's anemia which will need to be followed as well.  Charlie Pitter, PA-C 06/18/2019, 1:13 PM

## 2019-06-19 ENCOUNTER — Encounter (HOSPITAL_COMMUNITY): Payer: Self-pay | Admitting: Vascular Surgery

## 2019-06-19 ENCOUNTER — Other Ambulatory Visit (HOSPITAL_COMMUNITY)
Admission: RE | Admit: 2019-06-19 | Discharge: 2019-06-19 | Disposition: A | Discharge status: Home / Self Care | Payer: PPO | Source: Ambulatory Visit | Attending: Neurosurgery | Admitting: Neurosurgery

## 2019-06-19 ENCOUNTER — Telehealth: Payer: Self-pay | Admitting: Family Medicine

## 2019-06-19 DIAGNOSIS — Z20822 Contact with and (suspected) exposure to covid-19: Secondary | ICD-10-CM | POA: Diagnosis not present

## 2019-06-19 DIAGNOSIS — Z01812 Encounter for preprocedural laboratory examination: Secondary | ICD-10-CM | POA: Diagnosis not present

## 2019-06-19 LAB — SARS CORONAVIRUS 2 (TAT 6-24 HRS): SARS Coronavirus 2: NEGATIVE

## 2019-06-19 NOTE — Telephone Encounter (Signed)
She spoke with someone yesterday that cancelled her surgery due to what her diagnosis was recently in [facility].   The patient could not remember the name of the person but thought last name was [name]  They want to know exactly what diagnosis is and why surgery was cancelled.

## 2019-06-19 NOTE — Progress Notes (Signed)
Anesthesia Chart Review: SAME DAY WORK-UP   Case: [id number] Date/Time: 06/20/19 1150   Procedure: POSTERIOR LUMBAR INTERBODY FUSION, POSTERIOR INSTRUMENTATION, LUMBAR 3- LUMBAR 4, EXPLORE FUSION (N/A ) - 3C   Anesthesia type: General   Pre-op diagnosis: SPINAL STNOSIS, LUMBAR REGION WITH NEUROGENIC CLAUDICATION   Location: [place] OR ROOM 20 / [place] [facility]   Surgeons: Newman Pies, MD      DISCUSSION: Patient is a 74 year old female scheduled for the above procedure. She was recently admitted to [facility] 06/09/19-06/15/19 for acute on chronic abdominal pain with nausea/dryheaving and was found to be anemic and in afib with RVR (of undetermined duration). She was started on diltiazem drip. CT showed post-operative changes (history bypass) without acute changes. GI and cardiology consulted. 04/04/19 EGD showed large anastomotic ulcer with small satellite ulcers (felt related to NSAIDS, prescribed PPI/Carafate), but follow-up EGD 06/13/19 showed no obvious ulcer, but did have esophageal dilatation. Out-patient referral to bariatric surgery recommended. Echo showed, LVEF 55- 60%, mild MR/TR, LA severely dilated.  PAF felt to have been present for a long time. Cardiology recommended anticoagulation. Apparently, Hospitalist, Dr. Arnoldo Morale, and covering cardiologist discussed timing of anticoagulation given planned neurosurgery on 06/20/19 and decision make to start after surgery. CHA2DS2-VASc 4.   History includes former smoker (quit 2000), afib (diagnosed 05/2019), laparoscopic converted to open Roux-en-Y gastric bypass (0000000 at [facility]; complicated by MRSA wound infection, s/p incisional hernia repair 09/12/09; anastomotic ulcer 02/03/09, 04/04/19), anemia (with history of PRBC), anxiety, OCD, GERD, hiatal hernia, OSA, chronic back pain, hypothyrodism (left inferior parathyroidectomy, right thyroid lobectomy 06/06/08), left TKA (08/13/10), C3-7 ACDF (12/09/10), L4-5 PLIF (01/01/13).   Preoperative cardiac risk assessment received.  Dr. Domenic Polite was not aware of surgery plans when he saw her during recent admission; however a formal request for input sent to his office. On 06/16/19, he wrote, "We did see Ms. Asch in consultation at [facility] with recently documented persistent atrial fibrillation.  She had experienced chest discomfort with RVR, but the symptoms improved with rate control and she ultimately tolerated Cardizem CD 120 mg daily without further symptoms.  She has no clear history of obstructive CAD based on chart review although does have atherosclerosis, hypertension, and some family history of CAD.  Eliquis was initiated for stroke prophylaxis with CHA2DS2-VASc score of 4, I agree with recommendations to hold this 3 days prior to lumbar spine surgery.  At this point I do not suspect that she needs further ischemic work-up which would result in a delay in surgery.  LVEF is normal by echocardiogram...". However, Melina Copa, PA-C contacted patient on 06/18/19 to follow-up. She wrote, "Pre-op clearance was granted below. However, I called the patient to check on her today and she reports that she still has issues with her heart rate going all over the place any time she gets upset or does any kind of activity - develops fatigue, SOB, chest discomfort. Therefore based on this information I do not feel comfortable clearing her for this procedure and think she needs further cardiac evaluation prior to finalizing clearance. I asked if she felt poorly enough to return to the emergency room and she said yes. She prefers to try and have family transport her up there today, made aware EMS is an option. I told her I would have the office call and check on her tomorrow as well - if she did not go to the ER, needs an appt in the office to be seen. Will route this note to requesting surgeon to  make them aware."   The most recent cardiology input indicates that she needs re-evaluation prior to surgery. I have communicated with Lexine Baton at Dr.  Arnoldo Morale' office and also discussed HGB 8.3 on 06/14/19 (HGB 7.0-10.5 since 03/01/19). Current plan is to reschedule surgery for 07/04/19, so she can have cardiology re-evaluation between now and then.   06/19/19 presurgical COVID-19 test is in process, but will need updated test prior to 07/04/19.  Ideally, we can hopefully get her in for repeat labs prior to then as well (if not rechecked elsewhere).    VS:   Wt Readings from Last 3 Encounters:  06/09/19 99.8 kg  04/03/19 99.8 kg  04/03/19 99.6 kg   BP Readings from Last 3 Encounters:  06/15/19 138/74  04/05/19 131/61  04/03/19 (!) 188/100   Pulse Readings from Last 3 Encounters:  06/15/19 72  04/05/19 72  04/03/19 96     PROVIDERS: Lemmie Evens, MD is PCP  - Rourk, R. Legrand Como, MD is GI - Rozann Lesches, MD is cardiologist. Newly established during 05/2019 hospitalization for afib. Previously saw Jenkins Rouge, MD in 2014 for palpitations and cardiac event monitor showed NSR with PACs.   LABS: Most recent lab results include: Lab Results  Component Value Date   WBC 6.9 06/14/2019   HGB 8.3 (L) 06/14/2019   HCT 28.4 (L) 06/14/2019   PLT 261 06/14/2019   GLUCOSE 102 (H) 06/14/2019   ALT 9 06/09/2019   AST 16 06/09/2019   NA 139 06/14/2019   K 4.3 06/14/2019   CL 103 06/14/2019   CREATININE 1.24 (H) 06/14/2019   BUN 13 06/14/2019   CO2 27 06/14/2019   TSH 4.005 06/09/2019   INR 1.08 08/05/2010   HGBA1C 6.2 (H) 06/09/2019   CBC Latest Ref Rng & Units 06/14/2019 06/13/2019 06/12/2019  WBC 4.0 - 10.5 K/uL 6.9 6.3 5.8  Hemoglobin 12.0 - 15.0 g/dL 8.3(L) 8.6(L) 7.9(L)  Hematocrit 36.0 - 46.0 % 28.4(L) 29.4(L) 26.8(L)  Platelets 150 - 400 K/uL 261 258 243     OTHER:  EGD 06/13/19: FINDINGS:  The examined esophagus was normal. Surgically altered stomach with Billroth II type configuration. Small hiatal hernia present. Healthy-appearing residual gastric ptosis. Anastomosis looks good. Patent, normal appearing effernet and  affernet limbs The scope was withdrawn. Dilation was performed with a Maloney dilator with mild resistance at 30 Fr. The dilation site was examined following endoscope reinsertion and showed no change.  IMPRESSION: Status post prior gastric bypass surgery with Billroth II type anatomy as described. Otherwise, normal-appearing residual upper GI tract.  -This lady continues to complain somewhat bitterly about right mid abdominal pain with intermittent bulging. She has as much back pain as she does abdominal pain although I think they are 2 totally separate entities. Right sided spigelian hernia seen on a prior CT but not on her most recent scan. Likewise, no evidence of internal hernia seen on recent scan. Seen by Dr. Blake Divine previously. As she pointed out, not all internal hernias are evident on CT scan and she recommended referral to a bariatric surgeon for further evaluation. Cannot exclude an elusive spigelian hernia as a contributing factor to at least a component of her abdominal pain.   IMAGES: MRI C-spine 06/12/19: IMPRESSION: - ACDF C3 through C7 without stenosis - Disc degeneration and spurring at C3-4 causing moderate spinal stenosis bilaterally - Moderate foraminal encroachment bilaterally C7-T1 due to spurring.  MRI Brain 06/12/19: IMPRESSION: No acute abnormality.  Mild chronic microvascular ischemic change Image  quality degraded by artifact from earrings.  1V PCXR 06/09/19: FINDINGS: - The cardiac border appears slightly more prominent than the most recent comparison, potentially accentuated by apical lordotic positioning. Calcifications of the aortic arch. - No pneumothorax or pleural effusion. No confluent airspace disease. Coarsened interstitial markings similar to the prior. - No displaced fracture. - Surgical changes of the cervical region. IMPRESSION: Negative for acute cardiopulmonary disease.  MRI L-spine 04/18/19: IMPRESSION: 1. Lumbar spondylosis and  degenerative disc disease, causing prominent impingement at L3-4; moderate impingement at L5-S1; and mild impingement at L2-3, as detailed above. The impingement at L3-4 is significantly progressive compared to the previous 11/14/2012 Exam.    EKG:  EKG 06/09/19 10:56:39: new Atrial fibrillation with rapid V-rate @ 145 bpm Ventricular premature complex Artifact in lead(s) I III aVL Confirmed by Blanchie Dessert [phone]) on 06/10/2019 9:11:57 PM  EKG 04/03/19 10:52:59: Sinus rhythm Ventricular premature complex Borderline short PR interval Borderline T wave abnormalities No acute changes No significant change since last tracing Confirmed by Varney Biles [phone]) on 04/03/2019 11:37:28 AM   CV: Echo 06/10/19: IMPRESSIONS  1. Left ventricular ejection fraction, by estimation, is 55 to 60%. The  left ventricle has normal function. The left ventricle has no regional  wall motion abnormalities. There is mild concentric left ventricular  hypertrophy. Left ventricular diastolic  parameters are indeterminate.  2. Right ventricular systolic function is normal. The right ventricular  size is normal. There is normal pulmonary artery systolic pressure.  3. Left atrial size was severely dilated.  4. Right atrial size was mildly dilated.  5. The mitral valve is degenerative. Mild mitral valve regurgitation.  6. The aortic valve is tricuspid. Aortic valve regurgitation is not  visualized. No aortic stenosis is present.  7. The inferior vena cava is dilated in size with >50% respiratory  variability, suggesting right atrial pressure of 8 mmHg.    Cardiac event monitor 05/25/12-06/23/12: NSR PAC;s no significant arrhythmia   Stress Echo 01/29/10: Study Conclusions  - Stress ECG conclusions: Frequent PACs occurred during dobutamine  administration. The stress ECG was negative for ischemia,  demonstrating insignificant upsloping ST segment depression.  - Staged echo: There was no  echocardiographic evidence for  stress-induced ischemia.    Cardiac cath 09/13/01: CONCLUSION:  Normal coronary angiography and left ventricular wall motion.   Past Medical History:  Diagnosis Date  . Anemia   . Anxiety   . Arthritis   . Atrial fibrillation ([place])   . Chronic back pain    Spondylosis and stenosis  . Depression   . GERD (gastroesophageal reflux disease)   . Headache(784.0)   . Hiatal hernia   . History of blood transfusion   . History of GI bleed   . Hypothyroidism   . Nephrolithiasis   . Obsessive-compulsive disorder   . Recurrent upper respiratory infection (URI)   . Sleep apnea     Past Surgical History:  Procedure Laterality Date  . ABDOMINAL HYSTERECTOMY    . ANTERIOR CERVICAL DECOMP/DISCECTOMY FUSION  12/09/2010   Procedure: ANTERIOR CERVICAL DECOMPRESSION/DISCECTOMY FUSION 3 LEVELS;  Surgeon: Ophelia Charter;  Location: [place] [facility]URO ORS;  Service: Neurosurgery;  Laterality: N/A;  Cervical three-four,Cervical four-five,Cervical Five-Six,Cervical Six-Seven ANTERIOR CERVICAL DECOMPRESSION WITH FUSION INTERBODY PROTHESIS PLATING AND BONEGRAFT  . APPENDECTOMY    . BIOPSY N/A 11/14/2014   Procedure: GASTRIC BIOPSY;  Surgeon: Daneil Dolin, MD;  Location: [facility] ORS;  Service: Endoscopy;  Laterality: N/A;  . CESAREAN SECTION    . CHOLECYSTECTOMY    .  COLONOSCOPY WITH PROPOFOL N/A 11/14/2014   RMR: redundant colon but otherwise normal  . ESOPHAGOGASTRODUODENOSCOPY (EGD) WITH PROPOFOL N/A 11/14/2014   RMR"s/p gastric surgery/anastomtic ulcer  . ESOPHAGOGASTRODUODENOSCOPY (EGD) WITH PROPOFOL N/A 09/15/2017   Procedure: ESOPHAGOGASTRODUODENOSCOPY (EGD) WITH PROPOFOL;  Surgeon: Daneil Dolin, MD;  Location: [facility];  Service: Endoscopy;  Laterality: N/A;  11:30am  . ESOPHAGOGASTRODUODENOSCOPY (EGD) WITH PROPOFOL N/A 04/04/2019   2 cm ulcer crater at anastomosis with smaller satellite areas of ulceration in setting of NSAIDs/Goody powders, no bleeding  stigmata.  . ESOPHAGOGASTRODUODENOSCOPY (EGD) WITH PROPOFOL N/A 06/13/2019   Procedure: ESOPHAGOGASTRODUODENOSCOPY (EGD) WITH PROPOFOL;  Surgeon: Daneil Dolin, MD;  Location: [facility];  Service: Endoscopy;  Laterality: N/A;  Maloney dilation  . EYE SURGERY     bilateral cataracts removed, w/IOL  . GASTRIC BYPASS  2007  . JOINT REPLACEMENT     07/2010 &  2002- respectively- both knees   . MALONEY DILATION N/A 09/15/2017   Procedure: Venia Minks DILATION;  Surgeon: Daneil Dolin, MD;  Location: [facility];  Service: Endoscopy;  Laterality: N/A;  . PARATHYROIDECTOMY    . PERIPHERALLY INSERTED CENTRAL CATHETER INSERTION     PICC line for treatment of MRSA  . TONSILLECTOMY     as a child    MEDICATIONS: No current facility-administered medications for this encounter.   [name] apixaban (ELIQUIS) 5 MG TABS tablet  . Cyanocobalamin (VITAMIN B-12 IJ)  . diclofenac Sodium (VOLTAREN) 1 % GEL  . diltiazem (CARDIZEM CD) 120 MG 24 hr capsule  . furosemide (LASIX) 40 MG tablet  . levothyroxine (SYNTHROID, LEVOTHROID) 75 MCG tablet  . ondansetron (ZOFRAN ODT) 4 MG disintegrating tablet  . oxyCODONE-acetaminophen (PERCOCET) 5-325 MG tablet  . pantoprazole (PROTONIX) 40 MG tablet  . polyethylene glycol (MIRALAX / GLYCOLAX) 17 g packet  . sucralfate (CARAFATE) 1 GM/10ML suspension  . venlafaxine (EFFEXOR) 75 MG tablet  . Vitamin D, Ergocalciferol, (DRISDOL) 50000 units CAPS capsule    Myra Gianotti, PA-C Surgical Short Stay/Anesthesiology [facility] Phone [phone] [facility] Phone [phone] 06/19/2019 3:57 PM

## 2019-06-22 NOTE — Telephone Encounter (Signed)
Discharge Diagnoses:  Active Problems:   Essential hypertension   Atrial fibrillation with RVR (HCC)   Intractable nausea and vomiting   Sensory disturbance   Persistent atrial fibrillation (HCC)   Right sided abdominal pain  Above diagnosis given to patient per discharge summary and explained. Verbalized understanding

## 2019-06-22 NOTE — Telephone Encounter (Signed)
Scheduled with Katina Dung NP 06/26/19 @3 :00 pm at [facility]

## 2019-06-25 NOTE — Progress Notes (Addendum)
Cardiology Office Note  Date: 06/26/2019   ID: Xolani, Degracia [birth year], MRN [id number]  PCP:  Lemmie Evens, MD  Cardiologist:  Rozann Lesches, MD Electrophysiologist:  None   Chief Complaint: Preop cardiac clearance for spinal surgery  History of Present Illness: [name] is a 74 y.o. female with a history of HTN, HLD, prior tobacco use,   Recent hospital admission for abdominal pain, nausea and vomiting.  Found to be in atrial fibrillation with RVR.  She was seen in consultation by Dr. Domenic Polite on 06/12/2019.  She had reported a history of palpitations for several years but no formal diagnosis of atrial fibrillation prior to admission.  Echo showed EF of 55 to 60% with severe LAD dilation.  A rate control strategy  was recommended.  Her CHA2DS2-VASc score and unadjusted ischemic stroke right percent per year was equal to 4.8% stroke rate per year from a score of 4 (HTN, female, age, aortic plaque).  She was started on Eliquis 5 mg p.o. twice daily but was being held in anticipation of EGD.  She was anemic on admission with a hemoglobin of 9.1 which had decreased to 7.9.  Her blood pressure has been well controlled and she remained on Cardizem CD 120 mg/day.  Dr. Domenic Polite called the patient on 06/19/2019 and she reported she was still having issues with her heart rate "going all over the place" anytime she gets upset or does any kind of activity-develops fatigue, shortness of breath, chest discomfort.  He did not feel comfortable clearing her for the procedure and believed she needed further cardiac evaluation prior to finalizing clearance. He was routing the note to the requesting surgeon to make them aware of her circumstances.  Patient is here today for reevaluation for possible clearance for lumbar surgery by Dr. Arnoldo Morale.  She was recently inThe hospital for abdominal pain, nausea, vomiting.  She has a history of gastric bypass surgery with recent discovery of anastomotic ulcer  and small satellite ulcers on EGD by Dr. Gala Romney in March.  Her hemoglobin has been low recently.  The last hemoglobin and hematocrit on 06/14/2019 showed hemoglobin of 8.3 and hematocrit of 28.4.  Patient states she has had iron infusions in the past but none recently.  Her indices appear to show microcytic hypochromic anemia.  Currently she is not on any iron replacement.  She denies any bleeding in stool.  She denies any anginal or exertional symptoms but is not very active on a daily basis.  She has some chronic lower extremity edema for which she takes as needed Lasix.  She states she does not like to take the Lasix because it makes her feel weak.  She denies any orthostatic symptoms, PND, orthopnea, CVA or TIA-like symptoms.   Past Medical History:  Diagnosis Date  . Anemia   . Anxiety   . Arthritis   . Atrial fibrillation ([place])   . Chronic back pain    Spondylosis and stenosis  . Depression   . GERD (gastroesophageal reflux disease)   . Headache(784.0)   . Hiatal hernia   . History of blood transfusion   . History of GI bleed   . Hypothyroidism   . Nephrolithiasis   . Obsessive-compulsive disorder   . Recurrent upper respiratory infection (URI)   . Sleep apnea     Past Surgical History:  Procedure Laterality Date  . ABDOMINAL HYSTERECTOMY    . ANTERIOR CERVICAL DECOMP/DISCECTOMY FUSION  12/09/2010   Procedure: ANTERIOR CERVICAL  DECOMPRESSION/DISCECTOMY FUSION 3 LEVELS;  Surgeon: Ophelia Charter;  Location: [place] [facility];  Service: Neurosurgery;  Laterality: N/A;  Cervical three-four,Cervical four-five,Cervical Five-Six,Cervical Six-Seven ANTERIOR CERVICAL DECOMPRESSION WITH FUSION INTERBODY PROTHESIS PLATING AND BONEGRAFT  . APPENDECTOMY    . BIOPSY N/A 11/14/2014   Procedure: GASTRIC BIOPSY;  Surgeon: Daneil Dolin, MD;  Location: [facility];  Service: Endoscopy;  Laterality: N/A;  . CESAREAN SECTION    . CHOLECYSTECTOMY    . COLONOSCOPY WITH PROPOFOL N/A 11/14/2014   RMR:  redundant colon but otherwise normal  . ESOPHAGOGASTRODUODENOSCOPY (EGD) WITH PROPOFOL N/A 11/14/2014   RMR"s/p gastric surgery/anastomtic ulcer  . ESOPHAGOGASTRODUODENOSCOPY (EGD) WITH PROPOFOL N/A 09/15/2017   Procedure: ESOPHAGOGASTRODUODENOSCOPY (EGD) WITH PROPOFOL;  Surgeon: Daneil Dolin, MD;  [facility];  Service: Endoscopy;  Laterality: N/A;  11:30am  . ESOPHAGOGASTRODUODENOSCOPY (EGD) WITH PROPOFOL N/A 04/04/2019   2 cm ulcer crater at anastomosis with smaller satellite areas of ulceration in setting of NSAIDs/Goody powders, no bleeding stigmata.  . ESOPHAGOGASTRODUODENOSCOPY (EGD) WITH PROPOFOL N/A 06/13/2019   Procedure: ESOPHAGOGASTRODUODENOSCOPY (EGD) WITH PROPOFOL;  Surgeon: Daneil Dolin, MD;  [facility];  Service: Endoscopy;  Laterality: N/A;  Maloney dilation  . EYE SURGERY     bilateral cataracts removed, w/IOL  . GASTRIC BYPASS  2007  . JOINT REPLACEMENT     07/2010 &  2002- respectively- both knees   . MALONEY DILATION N/A 09/15/2017   Procedure: Venia Minks DILATION;  Surgeon: Daneil Dolin, MD;  [facility];  Service: Endoscopy;  Laterality: N/A;  . PARATHYROIDECTOMY    . PERIPHERALLY INSERTED CENTRAL CATHETER INSERTION     PICC line for treatment of MRSA  . TONSILLECTOMY     as a child    Current Outpatient Medications  Medication Sig Dispense Refill  . Cyanocobalamin (VITAMIN B-12 IJ) Inject 1 Applicatorful as directed every 30 (thirty) days.     . diclofenac Sodium (VOLTAREN) 1 % GEL Apply 2 g topically 4 (four) times daily.     . furosemide (LASIX) 40 MG tablet Take 40 mg by mouth as needed.    [name] levothyroxine (SYNTHROID, LEVOTHROID) 75 MCG tablet Take 75 mcg by mouth daily before breakfast.    . ondansetron (ZOFRAN ODT) 4 MG disintegrating tablet Take 1 tablet (4 mg total) by mouth every 8 (eight) hours as needed for nausea or vomiting. 20 tablet 0  . oxyCODONE-acetaminophen (PERCOCET) 5-325 MG tablet Take 1 tablet by mouth  every 6 (six) hours as needed for severe pain. 15 tablet 0  . pantoprazole (PROTONIX) 40 MG tablet Take 1 tablet (40 mg total) by mouth 2 (two) times daily before a meal. 60 tablet 1  . polyethylene glycol (MIRALAX / GLYCOLAX) 17 g packet Take 17 g by mouth daily. 30 each 0  . venlafaxine (EFFEXOR) 75 MG tablet Take 75 mg by mouth 2 (two) times daily.     . Vitamin D, Ergocalciferol, (DRISDOL) 50000 units CAPS capsule Take 50,000 Units by mouth every 7 (seven) days. Fridays    . apixaban (ELIQUIS) 5 MG TABS tablet Take 1 tablet (5 mg total) by mouth 2 (two) times daily. Start this medication after back surgery (Patient not taking: Reported on 06/26/2019) 60 tablet 0  . diltiazem (CARDIZEM CD) 240 MG 24 hr capsule Take 1 capsule (240 mg total) by mouth daily. 90 capsule 3   No current facility-administered medications for this visit.   Allergies:  Benzodiazepines, Codeine, Escitalopram oxalate, Nsaids, Sertraline hcl,  Statins, and Tramadol hcl   Social History: The patient  reports that she quit smoking about 21 years ago. Her smoking use included cigarettes. She has a 30.00 pack-year smoking history. She has never used smokeless tobacco. She reports that she does not drink alcohol or use drugs.   Family History: The patient's family history includes ADD / ADHD in her daughter; Anxiety disorder in her mother and sister; Bipolar disorder in her daughter; Heart attack in her father; OCD in her daughter.   ROS:  Please see the history of present illness. Otherwise, complete review of systems is positive for none.  All other systems are reviewed and negative.   Physical Exam: VS:  BP 134/82   Pulse (!) 102   Ht 5\' 5"  (1.651 m)   Wt 224 lb 9.6 oz (101.9 kg)   BMI 37.38 kg/m , BMI Body mass index is 37.38 kg/m.  Wt Readings from Last 3 Encounters:  06/26/19 224 lb 9.6 oz (101.9 kg)  06/09/19 220 lb (99.8 kg)  04/03/19 220 lb (99.8 kg)    General: Patient appears comfortable at rest. Neck:  Supple, no elevated JVP or carotid bruits, no thyromegaly. Lungs: Clear to auscultation, nonlabored breathing at rest. Cardiac: Regular rate and rhythm, no S3 or significant systolic murmur, no pericardial rub. Extremities: No pitting edema, distal pulses 2+. Skin: Warm and dry. Musculoskeletal: No kyphosis. Neuropsychiatric: Alert and oriented x3, affect grossly appropriate.  ECG:  An ECG dated 06/26/2019 was personally reviewed today and demonstrated:  Atrial flutter/fibrillation rate of 102.  Nonspecific T wave abnormality  Recent Labwork: 06/09/2019: ALT 9; AST 16; TSH 4.005 06/12/2019: Magnesium 2.0 06/14/2019: BUN 13; Creatinine, Ser 1.24; Hemoglobin 8.3; Platelets 261; Potassium 4.3; Sodium 139  No results found for: CHOL, TRIG, HDL, CHOLHDL, VLDL, LDLCALC, LDLDIRECT  Other Studies Reviewed Today:  Echocardiogram: 05/2019 IMPRESSIONS  1. Left ventricular ejection fraction, by estimation, is 55 to 60%. The  left ventricle has normal function. The left ventricle has no regional  wall motion abnormalities. There is mild concentric left ventricular  hypertrophy. Left ventricular diastolic  parameters are indeterminate.  2. Right ventricular systolic function is normal. The right ventricular  size is normal. There is normal pulmonary artery systolic pressure.  3. Left atrial size was severely dilated.  4. Right atrial size was mildly dilated.  5. The mitral valve is degenerative. Mild mitral valve regurgitation.  6. The aortic valve is tricuspid. Aortic valve regurgitation is not  visualized. No aortic stenosis is present.  7. The inferior vena cava is dilated in size with >50% respiratory  variability, suggesting right atrial pressure of 8 mmHg.   Assessment and Plan:  1. Encounter for pre-operative cardiovascular clearance   2. Paroxysmal atrial fibrillation (HCC)   3. Essential hypertension   4. Anemia, unspecified type   5. Pain in both lower extremities    1.  Encounter for pre-operative cardiovascular clearance Patient has scheduled lumbar surgery with Dr. Arnoldo Morale July 04, 2019 and needs preop clearance.  We currently cannot clear the patient until her heart rate is controlled. She also needs to consult with her gastroenterologist for her anemia to be cleared from their standpoint. Advised patient and daughter to contact them. Will forward my note to them.   2. Paroxysmal atrial fibrillation (HCC) Recent history of atrial fibrillation.  Patient was started on Cardizem 120 mg daily.  EKG today shows atrial flutter/fibrillation rate of 102.  Increase Cardizem to 240 mg daily.  Continue to hold Eliquis  for now until rate is controlled and we can possibly clear patient for surgery then start the medication after surgery is performed.  Follow-up in 1 week.  3. Essential hypertension Blood pressure under reasonable control.  She takes as needed Lasix.  States she does not like to take Lasix due to it making her feel weak.  Her primary care provider prescribed this for her per her statement.  4. Anemia, unspecified type Patient has peptic ulcer disease and iron deficiency anemia.  She recently had an EGD showing anastomotic ulcer from previous Billroth II gastric bypass surgery along with satellite ulcers noted.  She also has significant anemia.  States she has had an iron infusion in the past but nothing recently.  Most recent CBC showed hemoglobin of 8.3 and hematocrit of 28.4.  Other indices appear to show microcytic hypochromic anemia.  Advised patient and daughter to get in touch with Dr. Gala Romney or Dr. Laural Golden to see if she would be able to undergo her lumbar surgery given her anemia and to have them report back to Dr Arnoldo Morale.   Medication Adjustments/Labs and Tests Ordered: Current medicines are reviewed at length with the patient today.  Concerns regarding medicines are outlined above.   Disposition: Follow-up with  Dr. Johnsie Cancel or [facility] 1 week    Signed, Levell July, NP 06/26/2019 4:52 PM    [facility] [place], [place], [place] [address] Phone: [phone]; Fax: [phone]

## 2019-06-25 NOTE — Telephone Encounter (Signed)
Patient has appointment with Katina Dung, NP, tomorrow for hospital follow-up and pre-op evaluation. Will route this to him so that he is aware and remove from pre-op pool.

## 2019-06-26 ENCOUNTER — Encounter: Payer: Self-pay | Admitting: Family Medicine

## 2019-06-26 ENCOUNTER — Ambulatory Visit (INDEPENDENT_AMBULATORY_CARE_PROVIDER_SITE_OTHER): Payer: PPO | Admitting: Family Medicine

## 2019-06-26 ENCOUNTER — Other Ambulatory Visit: Payer: Self-pay

## 2019-06-26 VITALS — BP 134/82 | HR 102 | Ht 65.0 in | Wt 224.6 lb

## 2019-06-26 DIAGNOSIS — D649 Anemia, unspecified: Secondary | ICD-10-CM

## 2019-06-26 DIAGNOSIS — I10 Essential (primary) hypertension: Secondary | ICD-10-CM | POA: Diagnosis not present

## 2019-06-26 DIAGNOSIS — M79604 Pain in right leg: Secondary | ICD-10-CM | POA: Diagnosis not present

## 2019-06-26 DIAGNOSIS — M79605 Pain in left leg: Secondary | ICD-10-CM | POA: Diagnosis not present

## 2019-06-26 DIAGNOSIS — Z01810 Encounter for preprocedural cardiovascular examination: Secondary | ICD-10-CM

## 2019-06-26 DIAGNOSIS — I480 Paroxysmal atrial fibrillation: Secondary | ICD-10-CM | POA: Diagnosis not present

## 2019-06-26 MED ORDER — DILTIAZEM HCL ER COATED BEADS 240 MG PO CP24
240.0000 mg | ORAL_CAPSULE | Freq: Every day | ORAL | 3 refills | Status: DC
Start: 2019-06-26 — End: 2020-04-15

## 2019-06-26 NOTE — Progress Notes (Deleted)
Cardiology Office Note  Date: 06/26/2019   ID: [name], [name] [birth year], MRN [id number]  PCP:  Lemmie Evens, MD  Cardiologist:  [place] Electrophysiologist:  None   Chief Complaint: Afib , Anemia , Preop Clearance   History of Present Illness:  74 y.o. most recently seen by Dr Domenic Polite in hospital with f/u PA on 06/26/19 She has history of HTN, HLD , smoking She was admitted [facility] with abdominal pain and noted to be in afib. Due to severe LAE on echo Dr Domenic Polite felt rate control and anticoagulation warranted. She was started on cardizem and eliquis In f/u with PA 6/8 rate still high and cardizem dose increased to 240 mg daily   CHADVASC 4.    She also noted to have significant anemia with Hb 7.9 and has had previous bilroth 2 with anastomotic ulcer by EGD March of this year   She needs back surgery with Dr Arnoldo Morale As far as I can tell this is elective This should not be done until her anemia and afib are sorted   Echo 06/10/19 : reviewed EF 55-60% Mild MR LA 51 mm   ***    Past Medical History:  Diagnosis Date  . Anemia   . Anxiety   . Arthritis   . Atrial fibrillation ([place])   . Chronic back pain    Spondylosis and stenosis  . Depression   . GERD (gastroesophageal reflux disease)   . Headache(784.0)   . Hiatal hernia   . History of blood transfusion   . History of GI bleed   . Hypothyroidism   . Nephrolithiasis   . Obsessive-compulsive disorder   . Recurrent upper respiratory infection (URI)   . Sleep apnea     Past Surgical History:  Procedure Laterality Date  . ABDOMINAL HYSTERECTOMY    . ANTERIOR CERVICAL DECOMP/DISCECTOMY FUSION  12/09/2010   Procedure: ANTERIOR CERVICAL DECOMPRESSION/DISCECTOMY FUSION 3 LEVELS;  Surgeon: Ophelia Charter;  Location: [place] [facility];  Service: Neurosurgery;  Laterality: N/A;  Cervical three-four,Cervical four-five,Cervical Five-Six,Cervical Six-Seven ANTERIOR CERVICAL DECOMPRESSION WITH FUSION INTERBODY PROTHESIS  PLATING AND BONEGRAFT  . APPENDECTOMY    . BIOPSY N/A 11/14/2014   Procedure: GASTRIC BIOPSY;  Surgeon: Daneil Dolin, MD;  Location: [facility];  Service: Endoscopy;  Laterality: N/A;  . CESAREAN SECTION    . CHOLECYSTECTOMY    . COLONOSCOPY WITH PROPOFOL N/A 11/14/2014   RMR: redundant colon but otherwise normal  . ESOPHAGOGASTRODUODENOSCOPY (EGD) WITH PROPOFOL N/A 11/14/2014   RMR"s/p gastric surgery/anastomtic ulcer  . ESOPHAGOGASTRODUODENOSCOPY (EGD) WITH PROPOFOL N/A 09/15/2017   Procedure: ESOPHAGOGASTRODUODENOSCOPY (EGD) WITH PROPOFOL;  Surgeon: Daneil Dolin, MD;  [facility];  Service: Endoscopy;  Laterality: N/A;  11:30am  . ESOPHAGOGASTRODUODENOSCOPY (EGD) WITH PROPOFOL N/A 04/04/2019   2 cm ulcer crater at anastomosis with smaller satellite areas of ulceration in setting of NSAIDs/Goody powders, no bleeding stigmata.  . ESOPHAGOGASTRODUODENOSCOPY (EGD) WITH PROPOFOL N/A 06/13/2019   Procedure: ESOPHAGOGASTRODUODENOSCOPY (EGD) WITH PROPOFOL;  Surgeon: Daneil Dolin, MD;  [facility];  Service: Endoscopy;  Laterality: N/A;  Maloney dilation  . EYE SURGERY     bilateral cataracts removed, w/IOL  . GASTRIC BYPASS  2007  . JOINT REPLACEMENT     07/2010 &  2002- respectively- both knees   . MALONEY DILATION N/A 09/15/2017   Procedure: Venia Minks DILATION;  Surgeon: Daneil Dolin, MD;  [facility];  Service: Endoscopy;  Laterality: N/A;  . PARATHYROIDECTOMY    .  PERIPHERALLY INSERTED CENTRAL CATHETER INSERTION     PICC line for treatment of MRSA  . TONSILLECTOMY     as a child    Current Outpatient Medications  Medication Sig Dispense Refill  . apixaban (ELIQUIS) 5 MG TABS tablet Take 1 tablet (5 mg total) by mouth 2 (two) times daily. Start this medication after back surgery (Patient not taking: Reported on 06/26/2019) 60 tablet 0  . Cyanocobalamin (VITAMIN B-12 IJ) Inject 1 Applicatorful as directed every 30 (thirty) days.     . diclofenac Sodium  (VOLTAREN) 1 % GEL Apply 2 g topically 4 (four) times daily.     [name] diltiazem (CARDIZEM CD) 240 MG 24 hr capsule Take 1 capsule (240 mg total) by mouth daily. 90 capsule 3  . furosemide (LASIX) 40 MG tablet Take 40 mg by mouth as needed.    [name] levothyroxine (SYNTHROID, LEVOTHROID) 75 MCG tablet Take 75 mcg by mouth daily before breakfast.    . ondansetron (ZOFRAN ODT) 4 MG disintegrating tablet Take 1 tablet (4 mg total) by mouth every 8 (eight) hours as needed for nausea or vomiting. 20 tablet 0  . oxyCODONE-acetaminophen (PERCOCET) 5-325 MG tablet Take 1 tablet by mouth every 6 (six) hours as needed for severe pain. 15 tablet 0  . pantoprazole (PROTONIX) 40 MG tablet Take 1 tablet (40 mg total) by mouth 2 (two) times daily before a meal. 60 tablet 1  . polyethylene glycol (MIRALAX / GLYCOLAX) 17 g packet Take 17 g by mouth daily. 30 each 0  . venlafaxine (EFFEXOR) 75 MG tablet Take 75 mg by mouth 2 (two) times daily.     . Vitamin D, Ergocalciferol, (DRISDOL) 50000 units CAPS capsule Take 50,000 Units by mouth every 7 (seven) days. Fridays     No current facility-administered medications for this visit.   Allergies:  Benzodiazepines, Codeine, Escitalopram oxalate, Nsaids, Sertraline hcl, Statins, and Tramadol hcl   Social History: The patient  reports that she quit smoking about 21 years ago. Her smoking use included cigarettes. She has a 30.00 pack-year smoking history. She has never used smokeless tobacco. She reports that she does not drink alcohol or use drugs.   Family History: The patient's family history includes ADD / ADHD in her daughter; Anxiety disorder in her mother and sister; Bipolar disorder in her daughter; Heart attack in her father; OCD in her daughter.   ROS:  Please see the history of present illness. Otherwise, complete review of systems is positive for none.  All other systems are reviewed and negative.   Physical Exam: VS:  There were no vitals taken for this visit., BMI  There is no height or weight on file to calculate BMI.  Wt Readings from Last 3 Encounters:  06/26/19 224 lb 9.6 oz (101.9 kg)  06/09/19 220 lb (99.8 kg)  04/03/19 220 lb (99.8 kg)    Affect appropriate Pale female  HEENT: normal Neck supple with no adenopathy JVP normal no bruits no thyromegaly Lungs clear with no wheezing and good diaphragmatic motion Heart:  S1/S2 no murmur, no rub, gallop or click PMI normal Abdomen: benighn, BS positve, no tenderness, no AAA no bruit.  No HSM or HJR Distal pulses intact with no bruits No edema Neuro non-focal Skin warm and dry No muscular weakness   ECG:   Afib rate 102 nonspecific ST changes   Recent Labwork: 06/09/2019: ALT 9; AST 16; TSH 4.005 06/12/2019: Magnesium 2.0 06/14/2019: BUN 13; Creatinine, Ser 1.24; Hemoglobin 8.3; Platelets 261;  Potassium 4.3; Sodium 139  No results found for: CHOL, TRIG, HDL, CHOLHDL, VLDL, LDLCALC, LDLDIRECT  Other Studies Reviewed Today:  Echocardiogram: 05/2019 IMPRESSIONS  1. Left ventricular ejection fraction, by estimation, is 55 to 60%. The  left ventricle has normal function. The left ventricle has no regional  wall motion abnormalities. There is mild concentric left ventricular  hypertrophy. Left ventricular diastolic  parameters are indeterminate.  2. Right ventricular systolic function is normal. The right ventricular  size is normal. There is normal pulmonary artery systolic pressure.  3. Left atrial size was severely dilated.  4. Right atrial size was mildly dilated.  5. The mitral valve is degenerative. Mild mitral valve regurgitation.  6. The aortic valve is tricuspid. Aortic valve regurgitation is not  visualized. No aortic stenosis is present.  7. The inferior vena cava is dilated in size with >50% respiratory  variability, suggesting right atrial pressure of 8 mmHg.   Assessment and Plan:  No diagnosis found. 1. Pre operative:  No back surgery until further GI w/u and  Rx for significant anemia presumed to be from previus anastomotic lesion of gastric bypass surgery and bilroth 2 F/U Dr Laural Golden ? Iron Rx   2. Paroxysmal atrial fibrillation (HCC) - Seen by PA 6/8 and cardizem dose increased to 240 mg daily CHADVASC 4 Eliquis on hold for anemia. EF normal no valve disease and LAD 51 mm on echo ***  3. Essential hypertension -Well controlled.  Continue current medications and low sodium Dash type diet.   She prefers to avoid diuretics as they make her feel weak   4. Anemia, unspecified type - should be on iron Rx for microcytic anemia Ferritin very low at 5 06/10/19 F/U with primary GI Dr Sydell Axon and Laural Golden. No anticoagulation until this is sorted   Medication Adjustments/Labs and Tests Ordered: Current medicines are reviewed at length with the patient today.  Concerns regarding medicines are outlined above.    Jenkins Rouge MD [facility]

## 2019-06-26 NOTE — Patient Instructions (Addendum)
Medication Instructions:  Your physician recommends that you continue on your current medications as directed. Please refer to the Current Medication list given to you today.  *If you need a refill on your cardiac medications before your next appointment, please call your pharmacy*   Lab Work: None ordered  If you have labs (blood work) drawn today and your tests are completely normal, you will receive your results only by: [name] MyChart Message (if you have MyChart) OR . A paper copy in the mail If you have any lab test that is abnormal or we need to change your treatment, we will call you to review the results.   Testing/Procedures: None ordered    Follow-Up: At [facility], you and your health needs are our priority.  As part of our continuing mission to provide you with exceptional heart care, we have created designated Provider Care Teams.  These Care Teams include your primary Cardiologist (physician) and Advanced Practice Providers (APPs -  Physician Assistants and Nurse Practitioners) who all work together to provide you with the care you need, when you need it.  We recommend signing up for the patient portal called "MyChart".  Sign up information is provided on this After Visit Summary.  MyChart is used to connect with patients for Virtual Visits (Telemedicine).  Patients are able to view lab/test results, encounter notes, upcoming appointments, etc.  Non-urgent messages can be sent to your provider as well.   To learn more about what you can do with MyChart, go to [email or web address].    Your next appointment:   6 month(s)  The format for your next appointment:   In Person  Provider:   You may see Jenkins Rouge, MD or one of the following Advanced Practice Providers on your designated Care Team:    Bernerd Pho, PA-C   Ermalinda Barrios, [place]     Other Instructions

## 2019-07-02 ENCOUNTER — Ambulatory Visit: Payer: PPO | Admitting: Family Medicine

## 2019-07-02 ENCOUNTER — Other Ambulatory Visit: Payer: Self-pay | Admitting: Neurosurgery

## 2019-07-03 ENCOUNTER — Ambulatory Visit: Payer: PPO | Admitting: Cardiovascular Disease

## 2019-07-04 ENCOUNTER — Ambulatory Visit: Payer: PPO | Admitting: Family Medicine

## 2019-07-06 ENCOUNTER — Ambulatory Visit: Payer: PPO | Admitting: Family Medicine

## 2019-07-06 NOTE — Progress Notes (Deleted)
Cardiology Office Note  Date: 07/06/2019   ID: [name], [name] 1945/11/13, MRN [id number]  PCP:  Lemmie Evens, MD  Cardiologist:  Rozann Lesches, MD Electrophysiologist:  None   Chief Complaint: Preop cardiac clearance for spinal surgery  History of Present Illness: [name] is a 74 y.o. female with a history of HTN, HLD, prior tobacco use,   Recent hospital admission for abdominal pain, nausea and vomiting.  Found to be in atrial fibrillation with RVR.  She was seen in consultation by Dr. Domenic Polite on 06/12/2019.  She had reported a history of palpitations for several years but no formal diagnosis of atrial fibrillation prior to admission.  Echo showed EF of 55 to 60% with severe LAD dilation.  A rate control strategy  was recommended.  Her CHA2DS2-VASc score and unadjusted ischemic stroke right percent per year was equal to 4.8% stroke rate per year from a score of 4 (HTN, female, age, aortic plaque).  She was started on Eliquis 5 mg p.o. twice daily but was being held in anticipation of EGD.  She was anemic on admission with a hemoglobin of 9.1 which had decreased to 7.9.  Her blood pressure has been well controlled and she remained on Cardizem CD 120 mg/day.  Dr. Domenic Polite called the patient on 06/19/2019 and she reported she was still having issues with her heart rate "going all over the place" anytime she got upset or did any kind of activity-developed fatigue, shortness of breath, chest discomfort.  He did not feel comfortable clearing her for the procedure and believed she needed further cardiac evaluation prior to finalizing clearance. He was routing the note to the requesting surgeon to make them aware of her circumstances.  Saw me on  06/26/2019 for re-evaluation for possible clearance for lumbar surgery by Dr. Arnoldo Morale.  She was recently in the hospital for abdominal pain, nausea, vomiting.  She has a history of gastric bypass surgery with recent discovery of anastomotic ulcer  and small satellite ulcers on EGD by Dr. Gala Romney in March.  Her hemoglobin had been low recently.  The last hemoglobin and hematocrit on 06/14/2019 showed hemoglobin of 8.3 and hematocrit of 28.4.  Patient stated she had iron infusions in the past but none recently.  Her indices appeared to show microcytic hypochromic anemia.  Currently she was not on any iron replacement.  She denied any bleeding in stool.  She denied any anginal or exertional symptoms but is not very active on a daily basis.  She had some chronic lower extremity edema for which she takes as needed Lasix.  She stated she did not like to take the Lasix because it made her feel weak.  She denied any orthostatic symptoms, PND, orthopnea, CVA or TIA-like symptoms. Was unable to clear her d/t rapid atrial fibrillation. Cardizem was increased to 240 mg daily for better rate control. Advised her to speak with GI regarding clearance due to her anemia.   Past Medical History:  Diagnosis Date  . Anemia   . Anxiety   . Arthritis   . Atrial fibrillation ([place])   . Chronic back pain    Spondylosis and stenosis  . Depression   . GERD (gastroesophageal reflux disease)   . Headache(784.0)   . Hiatal hernia   . History of blood transfusion   . History of GI bleed   . Hypothyroidism   . Nephrolithiasis   . Obsessive-compulsive disorder   . Recurrent upper respiratory infection (URI)   .  Sleep apnea     Past Surgical History:  Procedure Laterality Date  . ABDOMINAL HYSTERECTOMY    . ANTERIOR CERVICAL DECOMP/DISCECTOMY FUSION  12/09/2010   Procedure: ANTERIOR CERVICAL DECOMPRESSION/DISCECTOMY FUSION 3 LEVELS;  Surgeon: Ophelia Charter;  Location: [facility];  Service: Neurosurgery;  Laterality: N/A;  Cervical three-four,Cervical four-five,Cervical Five-Six,Cervical Six-Seven ANTERIOR CERVICAL DECOMPRESSION WITH FUSION INTERBODY PROTHESIS PLATING AND BONEGRAFT  . APPENDECTOMY    . BIOPSY N/A 11/14/2014   Procedure: GASTRIC BIOPSY;   Surgeon: Daneil Dolin, MD;  Location: [facility];  Service: Endoscopy;  Laterality: N/A;  . CESAREAN SECTION    . CHOLECYSTECTOMY    . COLONOSCOPY WITH PROPOFOL N/A 11/14/2014   RMR: redundant colon but otherwise normal  . ESOPHAGOGASTRODUODENOSCOPY (EGD) WITH PROPOFOL N/A 11/14/2014   RMR"s/p gastric surgery/anastomtic ulcer  . ESOPHAGOGASTRODUODENOSCOPY (EGD) WITH PROPOFOL N/A 09/15/2017   Procedure: ESOPHAGOGASTRODUODENOSCOPY (EGD) WITH PROPOFOL;  Surgeon: Daneil Dolin, MD;  Location: [facility];  Service: Endoscopy;  Laterality: N/A;  11:30am  . ESOPHAGOGASTRODUODENOSCOPY (EGD) WITH PROPOFOL N/A 04/04/2019   2 cm ulcer crater at anastomosis with smaller satellite areas of ulceration in setting of NSAIDs/Goody powders, no bleeding stigmata.  . ESOPHAGOGASTRODUODENOSCOPY (EGD) WITH PROPOFOL N/A 06/13/2019   Procedure: ESOPHAGOGASTRODUODENOSCOPY (EGD) WITH PROPOFOL;  Surgeon: Daneil Dolin, MD;  Location: [facility];  Service: Endoscopy;  Laterality: N/A;  Maloney dilation  . EYE SURGERY     bilateral cataracts removed, w/IOL  . GASTRIC BYPASS  2007  . JOINT REPLACEMENT     07/2010 &  2002- respectively- both knees   . MALONEY DILATION N/A 09/15/2017   Procedure: Venia Minks DILATION;  Surgeon: Daneil Dolin, MD;  Location: [facility];  Service: Endoscopy;  Laterality: N/A;  . PARATHYROIDECTOMY    . PERIPHERALLY INSERTED CENTRAL CATHETER INSERTION     PICC line for treatment of MRSA  . TONSILLECTOMY     as a child    Current Outpatient Medications  Medication Sig Dispense Refill  . apixaban (ELIQUIS) 5 MG TABS tablet Take 1 tablet (5 mg total) by mouth 2 (two) times daily. Start this medication after back surgery (Patient not taking: Reported on 06/26/2019) 60 tablet 0  . Cyanocobalamin (VITAMIN B-12 IJ) Inject 1 Applicatorful as directed every 30 (thirty) days.     . diclofenac Sodium (VOLTAREN) 1 % GEL Apply 2 g topically 4 (four) times daily.     [name] diltiazem (CARDIZEM CD) 240  MG 24 hr capsule Take 1 capsule (240 mg total) by mouth daily. 90 capsule 3  . furosemide (LASIX) 40 MG tablet Take 40 mg by mouth as needed.    [name] levothyroxine (SYNTHROID, LEVOTHROID) 75 MCG tablet Take 75 mcg by mouth daily before breakfast.    . ondansetron (ZOFRAN ODT) 4 MG disintegrating tablet Take 1 tablet (4 mg total) by mouth every 8 (eight) hours as needed for nausea or vomiting. 20 tablet 0  . oxyCODONE-acetaminophen (PERCOCET) 5-325 MG tablet Take 1 tablet by mouth every 6 (six) hours as needed for severe pain. 15 tablet 0  . pantoprazole (PROTONIX) 40 MG tablet Take 1 tablet (40 mg total) by mouth 2 (two) times daily before a meal. 60 tablet 1  . polyethylene glycol (MIRALAX / GLYCOLAX) 17 g packet Take 17 g by mouth daily. 30 each 0  . venlafaxine (EFFEXOR) 75 MG tablet Take 75 mg by mouth 2 (two) times daily.     . Vitamin D, Ergocalciferol, (DRISDOL) 50000 units CAPS capsule  Take 50,000 Units by mouth every 7 (seven) days. Fridays     No current facility-administered medications for this visit.   Allergies:  Benzodiazepines, Codeine, Escitalopram oxalate, Nsaids, Sertraline hcl, Statins, and Tramadol hcl   Social History: The patient  reports that she quit smoking about 21 years ago. Her smoking use included cigarettes. She has a 30.00 pack-year smoking history. She has never used smokeless tobacco. She reports that she does not drink alcohol and does not use drugs.   Family History: The patient's family history includes ADD / ADHD in her daughter; Anxiety disorder in her mother and sister; Bipolar disorder in her daughter; Heart attack in her father; OCD in her daughter.   ROS:  Please see the history of present illness. Otherwise, complete review of systems is positive for none.  All other systems are reviewed and negative.   Physical Exam: VS:  There were no vitals taken for this visit., BMI There is no height or weight on file to calculate BMI.  Wt Readings from Last 3  Encounters:  06/26/19 224 lb 9.6 oz (101.9 kg)  06/09/19 220 lb (99.8 kg)  04/03/19 220 lb (99.8 kg)    General: Patient appears comfortable at rest. Neck: Supple, no elevated JVP or carotid bruits, no thyromegaly. Lungs: Clear to auscultation, nonlabored breathing at rest. Cardiac: Regular rate and rhythm, no S3 or significant systolic murmur, no pericardial rub. Extremities: No pitting edema, distal pulses 2+. Skin: Warm and dry. Musculoskeletal: No kyphosis. Neuropsychiatric: Alert and oriented x3, affect grossly appropriate.  ECG:  An ECG dated 06/26/2019 was personally reviewed today and demonstrated:  Atrial flutter/fibrillation rate of 102.  Nonspecific T wave abnormality  Recent Labwork: 06/09/2019: ALT 9; AST 16; TSH 4.005 06/12/2019: Magnesium 2.0 06/14/2019: BUN 13; Creatinine, Ser 1.24; Hemoglobin 8.3; Platelets 261; Potassium 4.3; Sodium 139  No results found for: CHOL, TRIG, HDL, CHOLHDL, VLDL, LDLCALC, LDLDIRECT  Other Studies Reviewed Today:  Echocardiogram: 05/2019 IMPRESSIONS  1. Left ventricular ejection fraction, by estimation, is 55 to 60%. The  left ventricle has normal function. The left ventricle has no regional  wall motion abnormalities. There is mild concentric left ventricular  hypertrophy. Left ventricular diastolic  parameters are indeterminate.  2. Right ventricular systolic function is normal. The right ventricular  size is normal. There is normal pulmonary artery systolic pressure.  3. Left atrial size was severely dilated.  4. Right atrial size was mildly dilated.  5. The mitral valve is degenerative. Mild mitral valve regurgitation.  6. The aortic valve is tricuspid. Aortic valve regurgitation is not  visualized. No aortic stenosis is present.  7. The inferior vena cava is dilated in size with >50% respiratory  variability, suggesting right atrial pressure of 8 mmHg.   Assessment and Plan:  1. Encounter for pre-operative cardiovascular  clearance   2. Paroxysmal atrial fibrillation (HCC)   3. Anemia, unspecified type   4. Essential hypertension    1. Encounter for pre-operative cardiovascular clearance Patient has scheduled lumbar surgery with Dr. Arnoldo Morale July 04, 2019 and needs preop clearance.  We currently cannot clear the patient until her heart rate is controlled. She also needs to consult with her gastroenterologist for her anemia to be cleared from their standpoint. Advised patient and daughter to contact them. Will forward my note to them.   2. Paroxysmal atrial fibrillation (HCC) Recent history of atrial fibrillation.  Patient was started on Cardizem 120 mg daily.  EKG today shows atrial flutter/fibrillation rate of 102.  Increase  Cardizem to 240 mg daily.  Continue to hold Eliquis for now until rate is controlled and we can possibly clear patient for surgery then start the medication after surgery is performed.  Follow-up in 1 week.  3. Essential hypertension Blood pressure under reasonable control.  She takes as needed Lasix.  States she does not like to take Lasix due to it making her feel weak.  Her primary care provider prescribed this for her per her statement.  4. Anemia, unspecified type Patient has peptic ulcer disease and iron deficiency anemia.  She recently had an EGD showing anastomotic ulcer from previous Billroth II gastric bypass surgery along with satellite ulcers noted.  She also has significant anemia.  States she has had an iron infusion in the past but nothing recently.  Most recent CBC showed hemoglobin of 8.3 and hematocrit of 28.4.  Other indices appear to show microcytic hypochromic anemia.  Advised patient and daughter to get in touch with Dr. Gala Romney or Dr. Laural Golden to see if she would be able to undergo her lumbar surgery given her anemia and to have them report back to Dr Arnoldo Morale.   Medication Adjustments/Labs and Tests Ordered: Current medicines are reviewed at length with the patient today.   Concerns regarding medicines are outlined above.   Disposition: Follow-up with  Dr. Johnsie Cancel or [facility] 1 week   Signed, Levell July, NP 07/06/2019 12:21 AM    [facility], [place], [place] [address] Phone: [phone]; Fax: [phone]

## 2019-07-12 NOTE — Progress Notes (Signed)
Cardiology Office Note  Date: 07/13/2019   ID: [name], [name] January 01, 1946, MRN [id number]  PCP:  Lemmie Evens, MD  Cardiologist:  Rozann Lesches, MD Electrophysiologist:  None   Chief Complaint: Preop cardiac clearance for spinal surgery  History of Present Illness: [name] is a 74 y.o. female with a history of HTN, HLD, prior tobacco use.  Recent hospital admission 06/09/2019 for abdominal pain, nausea and vomiting.  Found to be in atrial fibrillation with RVR.  She was seen in consultation by Dr. Domenic Polite on 06/12/2019.  She had reported a history of palpitations for several years but no formal diagnosis of atrial fibrillation prior to admission.  Echo showed EF of 55 to 60% with severe LA dilation.  A rate control strategy  was recommended.  Her CHA2DS2-VASc score and unadjusted ischemic stroke right percent per year was equal to 4.8% stroke rate per year from a score of 4 (HTN, female, age, aortic plaque).  She was started on Eliquis 5 mg p.o. twice daily but was being held in anticipation of EGD.  She was anemic on admission with a hemoglobin of 9.1 which had decreased to 7.9.  Her blood pressure has been well controlled and she remained on Cardizem CD 120 mg/day.  Dr. Domenic Polite called the patient on 06/19/2019 and she reported she was still having issues with her heart rate "going all over the place" anytime she got upset or did any kind of activity-developed fatigue, shortness of breath, chest discomfort.  He did not feel comfortable clearing her for the procedure and believed she needed further cardiac evaluation prior to finalizing clearance. He was routing the note to the requesting surgeon to make them aware of her circumstances.  Saw me on  06/26/2019 for re-evaluation for possible clearance for lumbar surgery by Dr. Arnoldo Morale.  She was recently in the hospital for abdominal pain, nausea, vomiting.  She has a history of gastric bypass surgery with recent discovery of anastomotic  ulcer and small satellite ulcers on EGD by Dr. Gala Romney in March.  Her hemoglobin had been low recently.  The last hemoglobin and hematocrit on 06/14/2019 showed hemoglobin of 8.3 and hematocrit of 28.4.  Patient stated she had iron infusions in the past but none recently.  Her indices appeared to show microcytic hypochromic anemia.  Currently she was not on any iron replacement.  She denied any bleeding in stool.  She denied any anginal or exertional symptoms but is not very active on a daily basis.  She had some chronic lower extremity edema for which she takes as needed Lasix.  She stated she did not like to take the Lasix because it made her feel weak.  She denied any orthostatic symptoms, PND, orthopnea, CVA or TIA-like symptoms. Was unable to clear her d/t rapid atrial fibrillation. Cardizem was increased to 240 mg daily for better rate control. Advised her to speak with GI regarding clearance due to her anemia.   She is here for follow-up today after increasing dose of Cardizem to 240 mg.  She continues in atrial fibrillation/flutter with RVR with a rate of 108.  States she feels nervous all the time and shaky.  She denies any significant shortness of breath.  States she is under stress secondary to living with her daughter.  She has pending lumbar surgery but cannot be cleared until her blood pressure and heart rate/rhythm are under control.  Also having issues with anemia.  We requested last visit she talk with  her GI specialist to check a CBC and treat her anemia.  It appears she has not done so.  She mentioned Dr. Karie Kirks and not being able to get in to see him due to the Covid pandemic.  She denies any anginal symptoms, dyspnea on exertion, orthostatic symptoms.    Past Medical History:  Diagnosis Date  . Anemia   . Anxiety   . Arthritis   . Atrial fibrillation ([place])   . Chronic back pain    Spondylosis and stenosis  . Depression   . GERD (gastroesophageal reflux disease)   . Headache(784.0)    . Hiatal hernia   . History of blood transfusion   . History of GI bleed   . Hypothyroidism   . Nephrolithiasis   . Obsessive-compulsive disorder   . Recurrent upper respiratory infection (URI)   . Sleep apnea     Past Surgical History:  Procedure Laterality Date  . ABDOMINAL HYSTERECTOMY    . ANTERIOR CERVICAL DECOMP/DISCECTOMY FUSION  12/09/2010   Procedure: ANTERIOR CERVICAL DECOMPRESSION/DISCECTOMY FUSION 3 LEVELS;  Surgeon: Ophelia Charter;  Location: [facility];  Service: Neurosurgery;  Laterality: N/A;  Cervical three-four,Cervical four-five,Cervical Five-Six,Cervical Six-Seven ANTERIOR CERVICAL DECOMPRESSION WITH FUSION INTERBODY PROTHESIS PLATING AND BONEGRAFT  . APPENDECTOMY    . BIOPSY N/A 11/14/2014   Procedure: GASTRIC BIOPSY;  Surgeon: Daneil Dolin, MD;  Location: [facility];  Service: Endoscopy;  Laterality: N/A;  . CESAREAN SECTION    . CHOLECYSTECTOMY    . COLONOSCOPY WITH PROPOFOL N/A 11/14/2014   RMR: redundant colon but otherwise normal  . ESOPHAGOGASTRODUODENOSCOPY (EGD) WITH PROPOFOL N/A 11/14/2014   RMR"s/p gastric surgery/anastomtic ulcer  . ESOPHAGOGASTRODUODENOSCOPY (EGD) WITH PROPOFOL N/A 09/15/2017   Procedure: ESOPHAGOGASTRODUODENOSCOPY (EGD) WITH PROPOFOL;  Surgeon: Daneil Dolin, MD;  Location: [facility];  Service: Endoscopy;  Laterality: N/A;  11:30am  . ESOPHAGOGASTRODUODENOSCOPY (EGD) WITH PROPOFOL N/A 04/04/2019   2 cm ulcer crater at anastomosis with smaller satellite areas of ulceration in setting of NSAIDs/Goody powders, no bleeding stigmata.  . ESOPHAGOGASTRODUODENOSCOPY (EGD) WITH PROPOFOL N/A 06/13/2019   Procedure: ESOPHAGOGASTRODUODENOSCOPY (EGD) WITH PROPOFOL;  Surgeon: Daneil Dolin, MD;  Location: [facility];  Service: Endoscopy;  Laterality: N/A;  Maloney dilation  . EYE SURGERY     bilateral cataracts removed, w/IOL  . GASTRIC BYPASS  2007  . JOINT REPLACEMENT     07/2010 &  2002- respectively- both knees   . MALONEY  DILATION N/A 09/15/2017   Procedure: Venia Minks DILATION;  Surgeon: Daneil Dolin, MD;  Location: [facility];  Service: Endoscopy;  Laterality: N/A;  . PARATHYROIDECTOMY    . PERIPHERALLY INSERTED CENTRAL CATHETER INSERTION     PICC line for treatment of MRSA  . TONSILLECTOMY     as a child    Current Outpatient Medications  Medication Sig Dispense Refill  . Cyanocobalamin (VITAMIN B-12 IJ) Inject 1 Applicatorful as directed every 30 (thirty) days.     . diclofenac Sodium (VOLTAREN) 1 % GEL Apply 2 g topically 4 (four) times daily.     [name] diltiazem (CARDIZEM CD) 240 MG 24 hr capsule Take 1 capsule (240 mg total) by mouth daily. 90 capsule 3  . furosemide (LASIX) 40 MG tablet Take 40 mg by mouth as needed.    [name] levothyroxine (SYNTHROID, LEVOTHROID) 75 MCG tablet Take 75 mcg by mouth daily before breakfast.    . ondansetron (ZOFRAN ODT) 4 MG disintegrating tablet Take 1 tablet (4 mg total) by mouth  every 8 (eight) hours as needed for nausea or vomiting. 20 tablet 0  . oxyCODONE-acetaminophen (PERCOCET) 5-325 MG tablet Take 1 tablet by mouth every 6 (six) hours as needed for severe pain. 15 tablet 0  . pantoprazole (PROTONIX) 40 MG tablet Take 1 tablet (40 mg total) by mouth 2 (two) times daily before a meal. 60 tablet 1  . polyethylene glycol (MIRALAX / GLYCOLAX) 17 g packet Take 17 g by mouth daily. 30 each 0  . venlafaxine (EFFEXOR) 75 MG tablet Take 75 mg by mouth 2 (two) times daily.     . Vitamin D, Ergocalciferol, (DRISDOL) 50000 units CAPS capsule Take 50,000 Units by mouth every 7 (seven) days. Fridays    . apixaban (ELIQUIS) 5 MG TABS tablet Take 1 tablet (5 mg total) by mouth 2 (two) times daily. Start this medication after back surgery (Patient not taking: Reported on 06/26/2019) 60 tablet 0  . metoprolol tartrate (LOPRESSOR) 25 MG tablet Take 0.5 tablets (12.5 mg total) by mouth 2 (two) times daily. 45 tablet 1   No current facility-administered medications for this visit.    Allergies:  Benzodiazepines, Codeine, Escitalopram oxalate, Nsaids, Sertraline hcl, Statins, and Tramadol hcl   Social History: The patient  reports that she quit smoking about 21 years ago. Her smoking use included cigarettes. She has a 30.00 pack-year smoking history. She has never used smokeless tobacco. She reports that she does not drink alcohol and does not use drugs.   Family History: The patient's family history includes ADD / ADHD in her daughter; Anxiety disorder in her mother and sister; Bipolar disorder in her daughter; Heart attack in her father; OCD in her daughter.   ROS:  Please see the history of present illness. Otherwise, complete review of systems is positive for none.  All other systems are reviewed and negative.   Physical Exam: VS:  BP (!) 160/80   Pulse 96   Ht 5\' 5"  (1.651 m)   Wt 216 lb 6.4 oz (98.2 kg)   SpO2 98%   BMI 36.01 kg/m , BMI Body mass index is 36.01 kg/m.  Wt Readings from Last 3 Encounters:  07/13/19 216 lb 6.4 oz (98.2 kg)  06/26/19 224 lb 9.6 oz (101.9 kg)  06/09/19 220 lb (99.8 kg)    General: Patient appears comfortable at rest. Neck: Supple, no elevated JVP or carotid bruits, no thyromegaly. Lungs: Clear to auscultation, nonlabored breathing at rest. Cardiac: Irregularly irregular tachycardic rate and rhythm, no S3 or significant systolic murmur, no pericardial rub. Extremities: No pitting edema, distal pulses 2+. Skin: Warm and dry. Musculoskeletal: No kyphosis. Neuropsychiatric: Alert and oriented x3, affect grossly appropriate.  ECG:  An ECG dated 07/13/2019 was personally reviewed today and demonstrated:  Atrial fibrillation with RVR with PVC, rate of 108.  Nonspecific ST and T wave abnormality.  Recent Labwork: 06/09/2019: ALT 9; AST 16; TSH 4.005 06/12/2019: Magnesium 2.0 06/14/2019: BUN 13; Creatinine, Ser 1.24; Hemoglobin 8.3; Platelets 261; Potassium 4.3; Sodium 139  No results found for: CHOL, TRIG, HDL, CHOLHDL, VLDL,  LDLCALC, LDLDIRECT  Other Studies Reviewed Today:  Echocardiogram: 05/2019 IMPRESSIONS  1. Left ventricular ejection fraction, by estimation, is 55 to 60%. The  left ventricle has normal function. The left ventricle has no regional  wall motion abnormalities. There is mild concentric left ventricular  hypertrophy. Left ventricular diastolic  parameters are indeterminate.  2. Right ventricular systolic function is normal. The right ventricular  size is normal. There is normal pulmonary artery systolic  pressure.  3. Left atrial size was severely dilated.  4. Right atrial size was mildly dilated.  5. The mitral valve is degenerative. Mild mitral valve regurgitation.  6. The aortic valve is tricuspid. Aortic valve regurgitation is not  visualized. No aortic stenosis is present.  7. The inferior vena cava is dilated in size with >50% respiratory  variability, suggesting right atrial pressure of 8 mmHg.   Assessment and Plan:   1. Encounter for pre-operative cardiovascular clearance Patient has pending lumbar surgery with Dr. Arnoldo Morale (on hold for now).  Previous encounter for clearance.  We currently cannot clear the patient until her heart rate is controlled.  Continue to hold surgery until atrial fibrillation rate/rhythm control and anemia have been resolved.  2. Paroxysmal atrial fibrillation ([place]) Last visit increased Cardizem to 240 mg daily.  We were continuing to hold Eliquis for now until rate was controlled in anticipation of surgery.  Possibly clear patient for surgery then start the medication after surgery was performed.  She presents today with continuing atrial fibrillation with RVR with a rate of 108.  Start metoprolol 12.5 mg p.o. twice daily.  Refer to atrial fibrillation clinic.  Get a thyroid panel to check for hyperthyroidism  3. Essential hypertension Blood pressure is elevated today.  She states she is very nervous and stressed out, also having back pain.  This  may account for some of the increased blood pressure.  I am adding metoprolol 12.5 mg p.o. twice daily for rate control of atrial fib.  Hopefully this will help with decreasing the blood pressure.  4. Anemia, unspecified type Patient has peptic ulcer disease and iron deficiency anemia.  She recently had an EGD showing anastomotic ulcer from previous Billroth II gastric bypass surgery along with satellite ulcers noted.  She also has significant anemia.  States she has had an iron infusion in the past but nothing recently.  Most recent CBC showed hemoglobin of 8.3 and hematocrit of 28.4.  Other indices appear to show microcytic hypochromic anemia.  Advised patient and daughter previously to get in touch with Dr. Gala Romney or Dr. Laural Golden to see if she would be able to undergo her lumbar surgery given her anemia and to have them report back to Dr Arnoldo Morale.  Apparently patient has not approached Dr. Laural Golden or Dr. Karie Kirks to have her anemia rechecked.  We will order a CBC today to check for anemia and proceed as needed to refer.  Medication Adjustments/Labs and Tests Ordered: Current medicines are reviewed at length with the patient today.  Concerns regarding medicines are outlined above.   Disposition: Follow-up with  Dr. Domenic Polite or APP 3 months, or after seen at atrial fibrillation clinic.  Signed, Levell July, NP 07/13/2019 5:24 PM    [facility] [address], [place], [place] [address] Phone: [phone]; Fax: [phone]

## 2019-07-13 ENCOUNTER — Encounter: Payer: Self-pay | Admitting: Family Medicine

## 2019-07-13 ENCOUNTER — Ambulatory Visit (INDEPENDENT_AMBULATORY_CARE_PROVIDER_SITE_OTHER): Payer: PPO | Admitting: Family Medicine

## 2019-07-13 ENCOUNTER — Other Ambulatory Visit: Payer: Self-pay

## 2019-07-13 VITALS — BP 160/80 | HR 96 | Ht 65.0 in | Wt 216.4 lb

## 2019-07-13 DIAGNOSIS — D649 Anemia, unspecified: Secondary | ICD-10-CM | POA: Diagnosis not present

## 2019-07-13 DIAGNOSIS — I480 Paroxysmal atrial fibrillation: Secondary | ICD-10-CM

## 2019-07-13 DIAGNOSIS — Z01810 Encounter for preprocedural cardiovascular examination: Secondary | ICD-10-CM

## 2019-07-13 DIAGNOSIS — I10 Essential (primary) hypertension: Secondary | ICD-10-CM

## 2019-07-13 MED ORDER — METOPROLOL TARTRATE 25 MG PO TABS
12.5000 mg | ORAL_TABLET | Freq: Two times a day (BID) | ORAL | 1 refills | Status: DC
Start: 2019-07-13 — End: 2019-10-26

## 2019-07-13 NOTE — Patient Instructions (Addendum)
Your physician recommends that you schedule a follow-up appointment in: [place], NP  Your physician has recommended you make the following change in your medication:   START LOPRESSOR 12.5 MG (1/2 TABLET) TWICE DAILY   Your physician recommends that you return for lab work TSH/T3/T4/CBC  You have been referred to [facility] [place]  Thank you for choosing [facility]!!

## 2019-07-17 ENCOUNTER — Telehealth: Payer: Self-pay | Admitting: Family Medicine

## 2019-07-17 NOTE — Telephone Encounter (Signed)
LM for pt to return call - [name] is not on pt DPR

## 2019-07-17 NOTE — Telephone Encounter (Signed)
[name] advised that there is no DPR on file giving [place] permission to speak with her. Spoke with patient who says she has misplaced her lab orders. Patient says she is going to [facility] lab-orders faxed. Patient advised that she would be contacted by the [facility] with an appointment. Verbalized understanding of plan.

## 2019-07-17 NOTE — Telephone Encounter (Signed)
Please call  Rana Snare (Daughter)  [phone]  she has questions about mothers' lab order and why she is being referred to the AFib Clinic.

## 2019-07-18 NOTE — Telephone Encounter (Signed)
Called and left 2nd VM for patient/daughter [name] to call AF Clinic to schedule appt. First message was left on 07/16/19.

## 2019-07-25 ENCOUNTER — Inpatient Hospital Stay (HOSPITAL_COMMUNITY): Admission: RE | Admit: 2019-07-25 | Payer: PPO | Source: Home / Self Care | Admitting: Neurosurgery

## 2019-07-25 DIAGNOSIS — I4891 Unspecified atrial fibrillation: Secondary | ICD-10-CM | POA: Diagnosis not present

## 2019-07-25 DIAGNOSIS — I10 Essential (primary) hypertension: Secondary | ICD-10-CM | POA: Diagnosis not present

## 2019-07-25 DIAGNOSIS — Z9884 Bariatric surgery status: Secondary | ICD-10-CM | POA: Diagnosis not present

## 2019-07-25 DIAGNOSIS — M545 Low back pain: Secondary | ICD-10-CM | POA: Diagnosis not present

## 2019-07-25 SURGERY — POSTERIOR LUMBAR FUSION 1 LEVEL
Anesthesia: General

## 2019-07-26 DIAGNOSIS — I480 Paroxysmal atrial fibrillation: Secondary | ICD-10-CM | POA: Diagnosis not present

## 2019-07-31 ENCOUNTER — Telehealth: Payer: Self-pay | Admitting: Family Medicine

## 2019-07-31 ENCOUNTER — Ambulatory Visit: Payer: PPO | Admitting: Student

## 2019-07-31 ENCOUNTER — Encounter: Payer: Self-pay | Admitting: *Deleted

## 2019-07-31 NOTE — Telephone Encounter (Signed)
Patient called requesting results of recent labs.

## 2019-07-31 NOTE — Telephone Encounter (Signed)
Reports having requested lab work done last Thursday at [place] that results would be requested since they have not been sent to our office nor available in [place]. Request sent to [facility] via fax.

## 2019-08-08 ENCOUNTER — Telehealth: Payer: Self-pay | Admitting: *Deleted

## 2019-08-08 NOTE — Telephone Encounter (Signed)
-----   Message from Verta Ellen., NP sent at 08/05/2019  8:09 AM EDT ----- Please call the patient and let her know the thyroid tests all came back normal. She continues to have some anemia and she has a history of this. She was informed last visit to follow up with GI specialist. When you call her please re-inforce the need to follow up with GI. Thanks

## 2019-08-08 NOTE — Telephone Encounter (Signed)
Noted  

## 2019-08-08 NOTE — Telephone Encounter (Signed)
-----   Message from Kara Mead sent at 08/08/2019  9:06 AM EDT ----- Regarding: Referral Hey,   Just an update.  You had put in a Referral per Levell July for pt to be seen in [facility].  I have left a couple of messages for pt/daughter to call to schedule appt.  So far she nor the daughter have called back to schedule an appt.  Thanks, [employer]

## 2019-08-08 NOTE — Telephone Encounter (Signed)
I think she needed to have orthopedic surgery and the atrial fibrillation needed to be controlled before she could have surgery.That's the reason for the referral. As I recall her rate was not controlled at last visit and we made a medication adjustment in hopes that the rate would be better controlled and made the referral to A Fib clinic also. Please try to call her and check to see how her heart rate is doing. Thanks.

## 2019-08-09 ENCOUNTER — Telehealth: Payer: Self-pay

## 2019-08-09 NOTE — Telephone Encounter (Signed)
Her PCP is in epic and we can see labs

## 2019-08-09 NOTE — Telephone Encounter (Signed)
Pt has an appointment coming up Tuesday with [facility]. Pt had labs at [facility] and wants our office to request labs before her apt.

## 2019-08-09 NOTE — Telephone Encounter (Signed)
Advised daughter Rana Snare) that she is not on DPR and to have patient call office when she gets home.

## 2019-08-09 NOTE — Telephone Encounter (Signed)
Patient informed. Copy sent to PCP °

## 2019-08-14 ENCOUNTER — Other Ambulatory Visit: Payer: Self-pay

## 2019-08-14 ENCOUNTER — Ambulatory Visit (INDEPENDENT_AMBULATORY_CARE_PROVIDER_SITE_OTHER): Payer: PPO | Admitting: Gastroenterology

## 2019-08-14 ENCOUNTER — Encounter: Payer: Self-pay | Admitting: Gastroenterology

## 2019-08-14 ENCOUNTER — Other Ambulatory Visit: Payer: Self-pay | Admitting: *Deleted

## 2019-08-14 VITALS — BP 147/86 | HR 85 | Temp 97.1°F | Ht 65.0 in | Wt 210.0 lb

## 2019-08-14 DIAGNOSIS — D509 Iron deficiency anemia, unspecified: Secondary | ICD-10-CM | POA: Diagnosis not present

## 2019-08-14 DIAGNOSIS — R1011 Right upper quadrant pain: Secondary | ICD-10-CM

## 2019-08-14 DIAGNOSIS — K439 Ventral hernia without obstruction or gangrene: Secondary | ICD-10-CM | POA: Diagnosis not present

## 2019-08-14 DIAGNOSIS — R112 Nausea with vomiting, unspecified: Secondary | ICD-10-CM

## 2019-08-14 DIAGNOSIS — K5900 Constipation, unspecified: Secondary | ICD-10-CM | POA: Diagnosis not present

## 2019-08-14 MED ORDER — LUBIPROSTONE 24 MCG PO CAPS: 24.0000 ug | ORAL_CAPSULE | Freq: Two times a day (BID) | ORAL | 3 refills | Status: DC

## 2019-08-14 MED ORDER — ONDANSETRON 4 MG PO TBDP: 4.0000 mg | ORAL_TABLET | ORAL | 1 refills | Status: DC | PRN

## 2019-08-14 NOTE — Patient Instructions (Signed)
1. Continue pantoprazole 40 mg twice daily before meals. 2. Start Amitiza 24 mcg twice daily with food for constipation. 3. Use Zofran 4 mg every 4-6 hours as needed for nausea and vomiting. 4. Referral to [facility] surgery for history of Spigelian hernia and recurrent abdominal pain and vomiting. 5. Referral to hematology for iron deficiency anemia/iron infusions. 6. I will discuss with Dr. Gala Romney regarding if colonoscopy is recommended.

## 2019-08-14 NOTE — Progress Notes (Signed)
Primary Care Physician:  Lemmie Evens, MD  Primary Gastroenterologist:  Garfield Cornea, MD   Chief Complaint  Patient presents with  . Anemia    f/u.    HPI:  [name] is a 74 y.o. female here for follow up of hospitalization in 05/2019.  She has a history of gastric bypass in 2007, history of IDA, anastomotic ulcer in 2016, history of right lateral abdominal wall hernia, history of small right sided spigelian hernia without bowel involvement (not reported but reviewed with radiology and seen on March 2021 CT as well as previous studies), inpatient admission March 2021 with abdominal pain status post EGD with 2 cm ulcer crater at the anastomosis with smaller satellite areas of ulceration in the setting of NSAID/Goody powders.  Patient evaluated by Dr. Constance Haw back in March and she did not feel there was any indication for surgery. Presented to the hospital again in May 2021 complaining of migratory abdominal pain predominantly in the lower abdomen.  Pain worse from her baseline, aggravated by movement and associated with vomiting.  Continues to complain of bulging in the right mid abdomen associated with pain, known small spigelian hernia seen on previous imaging studies.  Repeat CT scan in May with no appreciable abdominal wall hernia on that exam, no evidence of internal hernia.  Repeat EGD showed prior gastric bypass surgery with Billroth II type anatomy.  Otherwise normal-appearing residual upper GI tract.  Nothing to explain her pain.  She was advised outpatient evaluation with hematology for chronic IDA and need for iron infusions as well as to follow-up with the bariatric surgeon for ongoing abdominal pain and vomiting.  Patient states her back surgery has been placed on hold due to ongoing issues with her anemia and with cardiology.  Currently Eliquis is on hold.  She states that she has not been scheduled for an appointment with [facility] surgery or hematology.  She continues to  have right-sided abdominal pain associated with vomiting almost on a daily basis.  At the site of pain she has bulging which is apparent when she is symptomatic.  She can reproduce her symptoms with Valsalva maneuver.  Her weight is down 10 pounds since March.  She vomits almost daily.  Currently out of Zofran.  Having increased difficulty with constipation, ran out of MiraLAX but really did not find it helpful even though she was taken daily.  Stools still feel incomplete.  She continues to crave ice.  Feels lightheaded.  Denies chest pain or shortness of breath.  Appetite remains poor.  Able to drink liquids and keep her urine light in color.  Previously received iron infusions on a couple occasions.  She does not tolerate iron pills.  She is in agreement to go back to hematology for management of iron deficiency.  Her EGD is up-to-date.  Last colonoscopy in 2016, redundant colon but otherwise unremarkable. Last labs from 7/8: H/H 10/34.1, MCV 75.8  02/2019: stable fat containing right lateral abdominal wall hernia.  07/2017: small fat containing right spigelian hernia  Current Outpatient Medications  Medication Sig Dispense Refill  . Cyanocobalamin (VITAMIN B-12 IJ) Inject 1 Applicatorful as directed every 30 (thirty) days.     . diclofenac Sodium (VOLTAREN) 1 % GEL Apply 2 g topically 4 (four) times daily.     [name] diltiazem (CARDIZEM CD) 240 MG 24 hr capsule Take 1 capsule (240 mg total) by mouth daily. 90 capsule 3  . furosemide (LASIX) 40 MG tablet Take 40 mg by  mouth as needed.    [name] levothyroxine (SYNTHROID, LEVOTHROID) 75 MCG tablet Take 75 mcg by mouth daily before breakfast.    . metoprolol tartrate (LOPRESSOR) 25 MG tablet Take 0.5 tablets (12.5 mg total) by mouth 2 (two) times daily. 45 tablet 1  . ondansetron (ZOFRAN ODT) 4 MG disintegrating tablet Take 1 tablet (4 mg total) by mouth every 8 (eight) hours as needed for nausea or vomiting. 20 tablet 0  . oxyCODONE-acetaminophen (PERCOCET)  5-325 MG tablet Take 1 tablet by mouth every 6 (six) hours as needed for severe pain. 15 tablet 0  . pantoprazole (PROTONIX) 40 MG tablet Take 1 tablet (40 mg total) by mouth 2 (two) times daily before a meal. 60 tablet 1  . venlafaxine (EFFEXOR) 75 MG tablet Take 75 mg by mouth 2 (two) times daily.     . Vitamin D, Ergocalciferol, (DRISDOL) 50000 units CAPS capsule Take 50,000 Units by mouth every 7 (seven) days. Fridays    . apixaban (ELIQUIS) 5 MG TABS tablet Take 1 tablet (5 mg total) by mouth 2 (two) times daily. Start this medication after back surgery (Patient not taking: Reported on 06/26/2019) 60 tablet 0   No current facility-administered medications for this visit.    Allergies as of 08/14/2019 - Review Complete 08/14/2019  Allergen Reaction Noted  . Benzodiazepines Other (See Comments) 12/03/2011  . Codeine Nausea Only   . Escitalopram oxalate Nausea Only 12/02/2010  . Nsaids Other (See Comments) 12/22/2012  . Sertraline hcl Nausea Only   . Statins Nausea And Vomiting   . Tramadol hcl Other (See Comments) 12/22/2012    Past Medical History:  Diagnosis Date  . Anemia   . Anxiety   . Arthritis   . Atrial fibrillation ([place])   . Chronic back pain    Spondylosis and stenosis  . Depression   . GERD (gastroesophageal reflux disease)   . Headache(784.0)   . Hiatal hernia   . History of blood transfusion   . History of GI bleed   . Hypothyroidism   . Nephrolithiasis   . Obsessive-compulsive disorder   . Recurrent upper respiratory infection (URI)   . Sleep apnea     Past Surgical History:  Procedure Laterality Date  . ABDOMINAL HYSTERECTOMY    . ANTERIOR CERVICAL DECOMP/DISCECTOMY FUSION  12/09/2010   Procedure: ANTERIOR CERVICAL DECOMPRESSION/DISCECTOMY FUSION 3 LEVELS;  Surgeon: Ophelia Charter;  Location: [place];  Service: Neurosurgery;  Laterality: N/A;  Cervical three-four,Cervical four-five,Cervical Five-Six,Cervical Six-Seven ANTERIOR CERVICAL  DECOMPRESSION WITH FUSION INTERBODY PROTHESIS PLATING AND BONEGRAFT  . APPENDECTOMY    . BIOPSY N/A 11/14/2014   Procedure: GASTRIC BIOPSY;  Surgeon: Daneil Dolin, MD;  Location: [facility];  Service: Endoscopy;  Laterality: N/A;  . CESAREAN SECTION    . CHOLECYSTECTOMY    . COLONOSCOPY WITH PROPOFOL N/A 11/14/2014   RMR: redundant colon but otherwise normal  . ESOPHAGOGASTRODUODENOSCOPY (EGD) WITH PROPOFOL N/A 11/14/2014   RMR"s/p gastric surgery/anastomtic ulcer  . ESOPHAGOGASTRODUODENOSCOPY (EGD) WITH PROPOFOL N/A 09/15/2017   Procedure: ESOPHAGOGASTRODUODENOSCOPY (EGD) WITH PROPOFOL;  Surgeon: Daneil Dolin, MD;  Location: [facility];  Service: Endoscopy;  Laterality: N/A;  11:30am  . ESOPHAGOGASTRODUODENOSCOPY (EGD) WITH PROPOFOL N/A 04/04/2019   2 cm ulcer crater at anastomosis with smaller satellite areas of ulceration in setting of NSAIDs/Goody powders, no bleeding stigmata.  . ESOPHAGOGASTRODUODENOSCOPY (EGD) WITH PROPOFOL N/A 06/13/2019   [place]: Prior gastric bypass surgery with Billroth II type anatomy, otherwise normal-appearing residual upper GI tract.  [name]  EYE SURGERY     bilateral cataracts removed, w/IOL  . GASTRIC BYPASS  2007  . JOINT REPLACEMENT     07/2010 &  2002- respectively- both knees   . MALONEY DILATION N/A 09/15/2017   Procedure: Venia Minks DILATION;  Surgeon: Daneil Dolin, MD;  Location: [facility];  Service: Endoscopy;  Laterality: N/A;  . PARATHYROIDECTOMY    . PERIPHERALLY INSERTED CENTRAL CATHETER INSERTION     PICC line for treatment of MRSA  . TONSILLECTOMY     as a child    Family History  Problem Relation Age of Onset  . Anxiety disorder Mother   . Anxiety disorder Sister   . Bipolar disorder Daughter   . OCD Daughter   . ADD / ADHD Daughter   . Heart attack Father   . Anesthesia problems Neg Hx   . Hypotension Neg Hx   . Malignant hyperthermia Neg Hx   . Pseudochol deficiency Neg Hx   . Dementia Neg Hx   . Alcohol abuse Neg Hx   . Drug  abuse Neg Hx   . Depression Neg Hx   . Paranoid behavior Neg Hx   . Schizophrenia Neg Hx   . Seizures Neg Hx   . Sexual abuse Neg Hx   . Physical abuse Neg Hx   . Colon cancer Neg Hx     Social History   Socioeconomic History  . Marital status: Widowed    Spouse name: Not on file  . Number of children: Not on file  . Years of education: Not on file  . Highest education level: Not on file  Occupational History  . Not on file  Tobacco Use  . Smoking status: Former Smoker    Packs/day: 1.00    Years: 30.00    Pack years: 30.00    Types: Cigarettes    Quit date: 01/18/1998    Years since quitting: 21.5  . Smokeless tobacco: Never Used  Vaping Use  . Vaping Use: Never used  Substance and Sexual Activity  . Alcohol use: No  . Drug use: No  . Sexual activity: Not on file  Other Topics Concern  . Not on file  Social History Narrative  . Not on file   Social Determinants of Health   Financial Resource Strain:   . Difficulty of Paying Living Expenses:   Food Insecurity:   . Worried About Charity fundraiser in the Last Year:   . Arboriculturist in the Last Year:   Transportation Needs:   . Film/video editor (Medical):   [name] Lack of Transportation (Non-Medical):   Physical Activity:   . Days of Exercise per Week:   . Minutes of Exercise per Session:   Stress:   . Feeling of Stress :   Social Connections:   . Frequency of Communication with Friends and Family:   . Frequency of Social Gatherings with Friends and Family:   . Attends Religious Services:   . Active Member of Clubs or Organizations:   . Attends Archivist Meetings:   [name] Marital Status:   Intimate Partner Violence:   . Fear of Current or Ex-Partner:   . Emotionally Abused:   [name] Physically Abused:   . Sexually Abused:       ROS:  General: Negative for fever, chills, fatigue, weakness. See hpi Eyes: Negative for vision changes.  ENT: Negative for hoarseness, difficulty swallowing , nasal  congestion. CV: Negative for chest pain, angina,  palpitations, dyspnea on exertion, peripheral edema.  Respiratory: Negative for dyspnea at rest, dyspnea on exertion, cough, sputum, wheezing.  GI: See history of present illness. GU:  Negative for dysuria, hematuria, urinary incontinence, urinary frequency, nocturnal urination.  MS: Negative for joint pain, low back pain.  Derm: Negative for rash or itching.  Neuro: Negative for weakness, abnormal sensation, seizure, frequent headaches, memory loss, confusion.  Psych: Negative for anxiety, depression, suicidal ideation, hallucinations.  Endo: see hpi Heme: Negative for bruising or bleeding. Allergy: Negative for rash or hives.    Physical Examination:  BP (!) 147/86   Pulse 85   Temp (!) 97.1 F (36.2 C)   Ht 5\' 5"  (1.651 m)   Wt (!) 210 lb (95.3 kg)   BMI 34.95 kg/m    General: Well-nourished, well-developed in no acute distress. Accompanied by daughter, Estill Bamberg. Head: Normocephalic, atraumatic.   Eyes: Conjunctiva pink, no icterus. Mouth: masked. Neck: Supple without thyromegaly, masses, or lymphadenopathy.  Lungs: Clear to auscultation bilaterally.  Heart: Regular rate and rhythm, no murmurs rubs or gallops.  Abdomen: Bowel sounds are normal,  nondistended, no hepatosplenomegaly or masses, no abdominal bruits, no rebound or guarding.  Right lateral abdominal wall hernia easily reducible but tender.  Rectal: not performed Extremities: No lower extremity edema. No clubbing or deformities.  Neuro: Alert and oriented x 4 , grossly normal neurologically.  Skin: Warm and dry, no rash or jaundice.   Psych: Alert and cooperative, normal mood and affect.  Labs: Lab Results  Component Value Date   CREATININE 1.24 (H) 06/14/2019   BUN 13 06/14/2019   NA 139 06/14/2019   K 4.3 06/14/2019   CL 103 06/14/2019   CO2 27 06/14/2019   Lab Results  Component Value Date   WBC 6.9 06/14/2019   HGB 8.3 (L) 06/14/2019   HCT 28.4 (L)  06/14/2019   MCV 78.5 (L) 06/14/2019   PLT 261 06/14/2019   Lab Results  Component Value Date   ALT 9 06/09/2019   AST 16 06/09/2019   ALKPHOS 61 06/09/2019   BILITOT 0.7 06/09/2019   Lab Results  Component Value Date   IRON 20 (L) 06/10/2019   TIBC 436 06/10/2019   FERRITIN 5 (L) 06/10/2019   Lab Results  Component Value Date   ZOXWRUEA54 0,981 (H) 04/04/2019   Lab Results  Component Value Date   FOLATE 7.8 04/22/2015     Imaging Studies: See hpi  Impression/Plan:  74 y/o female with h/o gastric bypass in 2007, IDA, anastomotic ulcer in 2016 and 03/2019, h/o right lateral abdominal wall hernia, h/o small right sided spigelian hernia presenting for follow up of abdominal pain, n/v, IDA.  Patient complains of right sided abdominal pain at site of reproducible bulge and associated with N/V. Symptoms have been occurring chronically. Bulge comes and goes, reproducible with Valsalva maneuver, worse with movement. CT has shown both right lateral abdominal wall hernia and right sided spigelian hernia as noted in 02/2019 and 07/2017. Not always commented on CT reports but with review with radiologist CT 03/2019 as well. At least some of her symptoms seem to be related. Given her h/o gastric bypass, cannot rule out associated complication. We have recommended she follow up with bariatric surgeon for further evaluation. EGD 05/2019 showed healed anastomotic ulcer.   For IDA, likely due to malabsorption in setting of previous gastric bypass. Received iron infusions, remotely. Will refer to hematology for management. Her last colonoscopy was 2016, will discuss with Dr. Gala Romney, may  need to update. Recent Hgb improved.   Constipation: not doing well with miralax. Trial of amitiza 84mcg bid.

## 2019-08-23 ENCOUNTER — Other Ambulatory Visit (INDEPENDENT_AMBULATORY_CARE_PROVIDER_SITE_OTHER): Payer: Self-pay | Admitting: Internal Medicine

## 2019-08-24 ENCOUNTER — Telehealth: Payer: Self-pay | Admitting: *Deleted

## 2019-08-24 MED ORDER — PANTOPRAZOLE SODIUM 40 MG PO TBEC: 40.0000 mg | DELAYED_RELEASE_TABLET | Freq: Two times a day (BID) | ORAL | 11 refills | Status: DC

## 2019-08-24 NOTE — Telephone Encounter (Signed)
You are right. Pharmacy sent request to Dr. Laural Golden. I have sent in refills. Thanks.

## 2019-08-24 NOTE — Telephone Encounter (Signed)
Pt is out of Pantoprazole.  She took last pill yesterday.  Wants to know if we can refill.  [facility] [address].  Looks like refill request may have been sent to [facility] office yesterday instead of ours.

## 2019-08-24 NOTE — Addendum Note (Signed)
Addended by: Mahala Menghini on: 08/24/2019 09:29 AM   Modules accepted: Orders

## 2019-08-24 NOTE — Telephone Encounter (Signed)
Called and spoke to daughter.  She is listed on DPR.  Made her aware that RX had been sent.

## 2019-08-28 ENCOUNTER — Other Ambulatory Visit: Payer: Self-pay

## 2019-08-28 ENCOUNTER — Encounter (HOSPITAL_COMMUNITY): Payer: Self-pay | Admitting: *Deleted

## 2019-08-29 ENCOUNTER — Ambulatory Visit (HOSPITAL_COMMUNITY): Payer: PPO | Admitting: Hematology

## 2019-08-31 ENCOUNTER — Ambulatory Visit (HOSPITAL_COMMUNITY): Payer: PPO | Admitting: Hematology

## 2019-09-06 ENCOUNTER — Ambulatory Visit (HOSPITAL_COMMUNITY): Payer: PPO | Admitting: Hematology

## 2019-09-06 DIAGNOSIS — D508 Other iron deficiency anemias: Secondary | ICD-10-CM

## 2019-09-10 ENCOUNTER — Telehealth: Payer: Self-pay | Admitting: Internal Medicine

## 2019-09-10 NOTE — Telephone Encounter (Signed)
Medication for nausea and vomiting is not being covered by her insurance   Pharmacy will need authorization

## 2019-09-12 NOTE — Telephone Encounter (Signed)
Lmom, waiting on a return call.  

## 2019-09-13 NOTE — Telephone Encounter (Signed)
Lmom, waiting on a return call.  

## 2019-09-20 NOTE — Telephone Encounter (Signed)
Tried to do a PA for this medication- per [employer]: Nausea and vomiting with Abdominal pain is not an approved medical condition for zofran.  They included paperwork to do an appeal. Do you want [place] to start an appeal?

## 2019-09-20 NOTE — Telephone Encounter (Signed)
Is there a diagnosis that would get it covered? Or is the medication not covered at all?  I don't want to have to give a 74 y/o phenergan.

## 2019-09-21 NOTE — Telephone Encounter (Signed)
FYI Offered pt a good rx card and she said she isn't able to pay the $20.00 to get medication at [facility].

## 2019-09-25 NOTE — Telephone Encounter (Signed)
[name] let's start appeal.

## 2019-09-26 NOTE — Telephone Encounter (Signed)
Paperwork is filled out, will discuss with Magda Paganini.

## 2019-09-27 NOTE — Telephone Encounter (Signed)
Per Magda Paganini, ok to call the pharmacy and see if the insurance will pay for ondansetron 4mg  tablets tid prn #[id number] instead of the ondansetron ODT. Called pharmacy, had to leave a message, asked them to run rx and let me know if it is still being denied.

## 2019-10-01 NOTE — Telephone Encounter (Signed)
Appeal faxed to insurance company. Unable to get a person on the phone at the pharmacy.

## 2019-10-14 NOTE — Progress Notes (Deleted)
Cardiology Office Note  Date: 10/14/2019   ID: [name], [name] [birth year], MRN [id number]  PCP:  Lemmie Evens, MD  Cardiologist:  Rozann Lesches, MD Electrophysiologist:  None   Chief Complaint: Preop cardiac clearance for spinal surgery  History of Present Illness: [name] is a 74 y.o. female with a history of HTN, HLD, prior tobacco use.  Recent hospital admission 06/09/2019 for abdominal pain, nausea and vomiting.  Found to be in atrial fibrillation with RVR.  She was seen in consultation by Dr. Domenic Polite on 06/12/2019.  She had reported a history of palpitations for several years but no formal diagnosis of atrial fibrillation prior to admission.  Echo showed EF of 55 to 60% with severe LA dilation.  A rate control strategy  was recommended.  Her CHA2DS2-VASc score and unadjusted ischemic stroke right percent per year was equal to 4.8% stroke rate per year from a score of 4 (HTN, female, age, aortic plaque).  She was started on Eliquis 5 mg p.o. twice daily but was being held in anticipation of EGD.  She was anemic on admission with a hemoglobin of 9.1 which had decreased to 7.9.  Her blood pressure has been well controlled and she remained on Cardizem CD 120 mg/day.  Dr. Domenic Polite called the patient on 06/19/2019 and she reported she was still having issues with her heart rate "going all over the place" anytime she got upset or did any kind of activity-developed fatigue, shortness of breath, chest discomfort.  He did not feel comfortable clearing her for the procedure and believed she needed further cardiac evaluation prior to finalizing clearance. He was routing the note to the requesting surgeon to make them aware of her circumstances.  Saw me on  06/26/2019 for re-evaluation for possible clearance for lumbar surgery by Dr. Arnoldo Morale.  She was recently in the hospital for abdominal pain, nausea, vomiting.  She has a history of gastric bypass surgery with recent discovery of anastomotic  ulcer and small satellite ulcers on EGD by Dr. Gala Romney in March.  Her hemoglobin had been low recently.  The last hemoglobin and hematocrit on 06/14/2019 showed hemoglobin of 8.3 and hematocrit of 28.4.  Patient stated she had iron infusions in the past but none recently.  Her indices appeared to show microcytic hypochromic anemia.  Currently she was not on any iron replacement.  She denied any bleeding in stool.  She denied any anginal or exertional symptoms but is not very active on a daily basis.  She had some chronic lower extremity edema for which she takes as needed Lasix.  She stated she did not like to take the Lasix because it made her feel weak.  She denied any orthostatic symptoms, PND, orthopnea, CVA or TIA-like symptoms. Was unable to clear her d/t rapid atrial fibrillation. Cardizem was increased to 240 mg daily for better rate control. Advised her to speak with GI regarding clearance due to her anemia.   At last visit she was here for follow-up after increasing dose of Cardizem to 240 mg.  She continued in atrial fibrillation/flutter with RVR with a rate of 108.  Stated she felt nervous all the time and shaky.  She denied any significant shortness of breath.  Stated she was under stress secondary to living with her daughter.  She had pending lumbar surgery but cannot be cleared until her blood pressure and heart rate/rhythm are under control.  Also having issues with anemia.  We requested last visit she  talk with her GI specialist to check a CBC and treat her anemia.  It appears she has not done so.  She mentioned Dr. Karie Kirks and not being able to get in to see him due to the Covid pandemic.  She  denied any anginal symptoms, dyspnea on exertion, orthostatic symptoms.    Past Medical History:  Diagnosis Date  . Anemia   . Anxiety   . Arthritis   . Atrial fibrillation ([place])   . Chronic back pain    Spondylosis and stenosis  . Depression   . GERD (gastroesophageal reflux disease)   .  Headache(784.0)   . Hiatal hernia   . History of blood transfusion   . History of GI bleed   . Hypothyroidism   . Nephrolithiasis   . Obsessive-compulsive disorder   . Recurrent upper respiratory infection (URI)   . Sleep apnea     Past Surgical History:  Procedure Laterality Date  . ABDOMINAL HYSTERECTOMY    . ANTERIOR CERVICAL DECOMP/DISCECTOMY FUSION  12/09/2010   Procedure: ANTERIOR CERVICAL DECOMPRESSION/DISCECTOMY FUSION 3 LEVELS;  Surgeon: Ophelia Charter;  Location: [facility];  Service: Neurosurgery;  Laterality: N/A;  Cervical three-four,Cervical four-five,Cervical Five-Six,Cervical Six-Seven ANTERIOR CERVICAL DECOMPRESSION WITH FUSION INTERBODY PROTHESIS PLATING AND BONEGRAFT  . APPENDECTOMY    . BIOPSY N/A 11/14/2014   Procedure: GASTRIC BIOPSY;  Surgeon: Daneil Dolin, MD;  Location: [facility];  Service: Endoscopy;  Laterality: N/A;  . CESAREAN SECTION    . CHOLECYSTECTOMY    . COLONOSCOPY WITH PROPOFOL N/A 11/14/2014   RMR: redundant colon but otherwise normal  . ESOPHAGOGASTRODUODENOSCOPY (EGD) WITH PROPOFOL N/A 11/14/2014   RMR"s/p gastric surgery/anastomtic ulcer  . ESOPHAGOGASTRODUODENOSCOPY (EGD) WITH PROPOFOL N/A 09/15/2017   Procedure: ESOPHAGOGASTRODUODENOSCOPY (EGD) WITH PROPOFOL;  Surgeon: Daneil Dolin, MD;  [facility];  Service: Endoscopy;  Laterality: N/A;  11:30am  . ESOPHAGOGASTRODUODENOSCOPY (EGD) WITH PROPOFOL N/A 04/04/2019   2 cm ulcer crater at anastomosis with smaller satellite areas of ulceration in setting of NSAIDs/Goody powders, no bleeding stigmata.  . ESOPHAGOGASTRODUODENOSCOPY (EGD) WITH PROPOFOL N/A 06/13/2019   Rourk: Prior gastric bypass surgery with Billroth II type anatomy, otherwise normal-appearing residual upper GI tract.  [name] EYE SURGERY     bilateral cataracts removed, w/IOL  . GASTRIC BYPASS  2007  . JOINT REPLACEMENT     07/2010 &  2002- respectively- both knees   . MALONEY DILATION N/A 09/15/2017   Procedure:  Venia Minks DILATION;  Surgeon: Daneil Dolin, MD;  [facility];  Service: Endoscopy;  Laterality: N/A;  . PARATHYROIDECTOMY    . PERIPHERALLY INSERTED CENTRAL CATHETER INSERTION     PICC line for treatment of MRSA  . TONSILLECTOMY     as a child    Current Outpatient Medications  Medication Sig Dispense Refill  . apixaban (ELIQUIS) 5 MG TABS tablet Take 1 tablet (5 mg total) by mouth 2 (two) times daily. Start this medication after back surgery (Patient not taking: Reported on 06/26/2019) 60 tablet 0  . Cholecalciferol (VITAMIN D3) 1.25 MG (50000 UT) CAPS Take 1 capsule by mouth once a week.    . Cyanocobalamin (VITAMIN B-12 IJ) Inject 1 Applicatorful as directed every 30 (thirty) days.     . diclofenac Sodium (VOLTAREN) 1 % GEL Apply 2 g topically 4 (four) times daily.     [name] diltiazem (CARDIZEM CD) 240 MG 24 hr capsule Take 1 capsule (240 mg total) by mouth daily. 90 capsule 3  .  furosemide (LASIX) 40 MG tablet Take 40 mg by mouth as needed.    [name] levothyroxine (SYNTHROID, LEVOTHROID) 75 MCG tablet Take 75 mcg by mouth daily before breakfast.    . lubiprostone (AMITIZA) 24 MCG capsule Take 1 capsule (24 mcg total) by mouth 2 (two) times daily with a meal. 60 capsule 3  . metoprolol tartrate (LOPRESSOR) 25 MG tablet Take 0.5 tablets (12.5 mg total) by mouth 2 (two) times daily. 45 tablet 1  . ondansetron (ZOFRAN ODT) 4 MG disintegrating tablet Take 1 tablet (4 mg total) by mouth every 4 (four) hours as needed for nausea or vomiting. 30 tablet 1  . oxyCODONE-acetaminophen (PERCOCET) 5-325 MG tablet Take 1 tablet by mouth every 6 (six) hours as needed for severe pain. 15 tablet 0  . pantoprazole (PROTONIX) 40 MG tablet Take 1 tablet (40 mg total) by mouth 2 (two) times daily before a meal. 60 tablet 11  . venlafaxine (EFFEXOR) 75 MG tablet Take 75 mg by mouth 2 (two) times daily.     . Vitamin D, Ergocalciferol, (DRISDOL) 50000 units CAPS capsule Take 50,000 Units by mouth every 7  (seven) days. Fridays     No current facility-administered medications for this visit.   Allergies:  Benzodiazepines, Codeine, Escitalopram oxalate, Nsaids, Sertraline hcl, Statins, and Tramadol hcl   Social History: The patient  reports that she quit smoking about 21 years ago. Her smoking use included cigarettes. She has a 30.00 pack-year smoking history. She has never used smokeless tobacco. She reports that she does not drink alcohol and does not use drugs.   Family History: The patient's family history includes ADD / ADHD in her daughter; Anxiety disorder in her mother and sister; Bipolar disorder in her daughter; Breast cancer in her mother and sister; Heart attack in her father; OCD in her daughter.   ROS:  Please see the history of present illness. Otherwise, complete review of systems is positive for none.  All other systems are reviewed and negative.   Physical Exam: VS:  There were no vitals taken for this visit., BMI There is no height or weight on file to calculate BMI.  Wt Readings from Last 3 Encounters:  08/14/19 (!) 210 lb (95.3 kg)  07/13/19 216 lb 6.4 oz (98.2 kg)  06/26/19 224 lb 9.6 oz (101.9 kg)    General: Patient appears comfortable at rest. Neck: Supple, no elevated JVP or carotid bruits, no thyromegaly. Lungs: Clear to auscultation, nonlabored breathing at rest. Cardiac: Irregularly irregular tachycardic rate and rhythm, no S3 or significant systolic murmur, no pericardial rub. Extremities: No pitting edema, distal pulses 2+. Skin: Warm and dry. Musculoskeletal: No kyphosis. Neuropsychiatric: Alert and oriented x3, affect grossly appropriate.  ECG:  An ECG dated 07/13/2019 was personally reviewed today and demonstrated:  Atrial fibrillation with RVR with PVC, rate of 108.  Nonspecific ST and T wave abnormality.  Recent Labwork: 06/09/2019: ALT 9; AST 16; TSH 4.005 06/12/2019: Magnesium 2.0 06/14/2019: BUN 13; Creatinine, Ser 1.24; Hemoglobin 8.3; Platelets 261;  Potassium 4.3; Sodium 139  No results found for: CHOL, TRIG, HDL, CHOLHDL, VLDL, LDLCALC, LDLDIRECT  Other Studies Reviewed Today:  Echocardiogram: 05/2019 IMPRESSIONS  1. Left ventricular ejection fraction, by estimation, is 55 to 60%. The  left ventricle has normal function. The left ventricle has no regional  wall motion abnormalities. There is mild concentric left ventricular  hypertrophy. Left ventricular diastolic  parameters are indeterminate.  2. Right ventricular systolic function is normal. The right ventricular  size  is normal. There is normal pulmonary artery systolic pressure.  3. Left atrial size was severely dilated.  4. Right atrial size was mildly dilated.  5. The mitral valve is degenerative. Mild mitral valve regurgitation.  6. The aortic valve is tricuspid. Aortic valve regurgitation is not  visualized. No aortic stenosis is present.  7. The inferior vena cava is dilated in size with >50% respiratory  variability, suggesting right atrial pressure of 8 mmHg.   Assessment and Plan:   1. Encounter for pre-operative cardiovascular clearance Patient has pending lumbar surgery with Dr. Arnoldo Morale (on hold for now).  Previous encounter for clearance.  We currently cannot clear the patient until her heart rate is controlled.  Continue to hold surgery until atrial fibrillation rate/rhythm control and anemia have been resolved.  2. Paroxysmal atrial fibrillation ([place]) Last visit increased Cardizem to 240 mg daily.  We were continuing to hold Eliquis for now until rate was controlled in anticipation of surgery.  Possibly clear patient for surgery then start the medication after surgery was performed.  She presents today with continuing atrial fibrillation with RVR with a rate of 108.  Start metoprolol 12.5 mg p.o. twice daily.  Refer to atrial fibrillation clinic.  Get a thyroid panel to check for hyperthyroidism  3. Essential hypertension Blood pressure is elevated  today.  She states she is very nervous and stressed out, also having back pain.  This may account for some of the increased blood pressure.  I am adding metoprolol 12.5 mg p.o. twice daily for rate control of atrial fib.  Hopefully this will help with decreasing the blood pressure.  4. Anemia, unspecified type Patient has peptic ulcer disease and iron deficiency anemia.  She recently had an EGD showing anastomotic ulcer from previous Billroth II gastric bypass surgery along with satellite ulcers noted.  She also has significant anemia.  States she has had an iron infusion in the past but nothing recently.  Most recent CBC showed hemoglobin of 8.3 and hematocrit of 28.4.  Other indices appear to show microcytic hypochromic anemia.  Advised patient and daughter previously to get in touch with Dr. Gala Romney or Dr. Laural Golden to see if she would be able to undergo her lumbar surgery given her anemia and to have them report back to Dr Arnoldo Morale.  Apparently patient has not approached Dr. Laural Golden or Dr. Karie Kirks to have her anemia rechecked.  We will order a CBC today to check for anemia and proceed as needed to refer.  Medication Adjustments/Labs and Tests Ordered: Current medicines are reviewed at length with the patient today.  Concerns regarding medicines are outlined above.   Disposition: Follow-up with  Dr. Domenic Polite or APP 3 months, or after seen at atrial fibrillation clinic.  Signed, Levell July, NP 10/14/2019 11:48 PM    [facility] at [address], [place], [place] [address] Phone: [phone]; Fax: [phone]

## 2019-10-15 ENCOUNTER — Ambulatory Visit: Payer: PPO | Admitting: Family Medicine

## 2019-10-24 DIAGNOSIS — E039 Hypothyroidism, unspecified: Secondary | ICD-10-CM | POA: Diagnosis not present

## 2019-10-24 DIAGNOSIS — M48062 Spinal stenosis, lumbar region with neurogenic claudication: Secondary | ICD-10-CM | POA: Diagnosis not present

## 2019-10-24 DIAGNOSIS — D509 Iron deficiency anemia, unspecified: Secondary | ICD-10-CM | POA: Diagnosis not present

## 2019-10-24 DIAGNOSIS — Z8616 Personal history of COVID-19: Secondary | ICD-10-CM | POA: Diagnosis not present

## 2019-10-24 DIAGNOSIS — K2100 Gastro-esophageal reflux disease with esophagitis, without bleeding: Secondary | ICD-10-CM | POA: Diagnosis not present

## 2019-10-24 DIAGNOSIS — E782 Mixed hyperlipidemia: Secondary | ICD-10-CM | POA: Diagnosis not present

## 2019-10-24 DIAGNOSIS — Z79899 Other long term (current) drug therapy: Secondary | ICD-10-CM | POA: Diagnosis not present

## 2019-10-26 ENCOUNTER — Other Ambulatory Visit: Payer: Self-pay | Admitting: Family Medicine

## 2019-10-30 DIAGNOSIS — M064 Inflammatory polyarthropathy: Secondary | ICD-10-CM | POA: Diagnosis not present

## 2019-10-30 DIAGNOSIS — G4700 Insomnia, unspecified: Secondary | ICD-10-CM | POA: Diagnosis not present

## 2019-10-30 DIAGNOSIS — R229 Localized swelling, mass and lump, unspecified: Secondary | ICD-10-CM | POA: Diagnosis not present

## 2019-10-30 DIAGNOSIS — M48062 Spinal stenosis, lumbar region with neurogenic claudication: Secondary | ICD-10-CM | POA: Diagnosis not present

## 2019-10-30 DIAGNOSIS — R600 Localized edema: Secondary | ICD-10-CM | POA: Diagnosis not present

## 2019-10-30 DIAGNOSIS — E039 Hypothyroidism, unspecified: Secondary | ICD-10-CM | POA: Diagnosis not present

## 2019-10-30 DIAGNOSIS — E782 Mixed hyperlipidemia: Secondary | ICD-10-CM | POA: Diagnosis not present

## 2019-10-30 DIAGNOSIS — I10 Essential (primary) hypertension: Secondary | ICD-10-CM | POA: Diagnosis not present

## 2019-10-30 DIAGNOSIS — K2100 Gastro-esophageal reflux disease with esophagitis, without bleeding: Secondary | ICD-10-CM | POA: Diagnosis not present

## 2019-10-30 DIAGNOSIS — E538 Deficiency of other specified B group vitamins: Secondary | ICD-10-CM | POA: Diagnosis not present

## 2019-10-30 DIAGNOSIS — D509 Iron deficiency anemia, unspecified: Secondary | ICD-10-CM | POA: Diagnosis not present

## 2019-10-30 DIAGNOSIS — R1031 Right lower quadrant pain: Secondary | ICD-10-CM | POA: Diagnosis not present

## 2019-11-05 ENCOUNTER — Other Ambulatory Visit: Payer: Self-pay | Admitting: Gastroenterology

## 2019-11-21 ENCOUNTER — Encounter (HOSPITAL_COMMUNITY)
Admission: RE | Admit: 2019-11-21 | Discharge: 2019-11-21 | Disposition: A | Discharge status: Home / Self Care | Payer: PPO | Source: Ambulatory Visit | Attending: Family Medicine | Admitting: Family Medicine

## 2019-11-21 ENCOUNTER — Other Ambulatory Visit: Payer: Self-pay

## 2019-11-21 ENCOUNTER — Encounter (HOSPITAL_COMMUNITY): Payer: Self-pay

## 2019-11-21 DIAGNOSIS — D500 Iron deficiency anemia secondary to blood loss (chronic): Secondary | ICD-10-CM | POA: Insufficient documentation

## 2019-11-21 MED ORDER — SODIUM CHLORIDE 0.9 % IV SOLN: Freq: Once | INTRAVENOUS | Status: AC

## 2019-11-21 MED ORDER — SODIUM CHLORIDE 0.9 % IV SOLN
510.0000 mg | Freq: Once | INTRAVENOUS | Status: AC
  Administered 2019-11-21: 510 mg via INTRAVENOUS
  Filled 2019-11-21: qty 510

## 2019-11-28 ENCOUNTER — Encounter (HOSPITAL_COMMUNITY): Admission: RE | Admit: 2019-11-28 | Payer: PPO | Source: Ambulatory Visit

## 2019-11-28 ENCOUNTER — Encounter (HOSPITAL_COMMUNITY)
Admission: RE | Admit: 2019-11-28 | Discharge: 2019-11-28 | Disposition: A | Discharge status: Home / Self Care | Payer: PPO | Source: Ambulatory Visit | Attending: Family Medicine | Admitting: Family Medicine

## 2019-11-28 MED ORDER — SODIUM CHLORIDE 0.9 % IV SOLN
510.0000 mg | Freq: Once | INTRAVENOUS | Status: DC
  Filled 2019-11-28: qty 17

## 2019-11-28 MED ORDER — SODIUM CHLORIDE 0.9 % IV SOLN: Freq: Once | INTRAVENOUS | Status: DC

## 2019-11-29 ENCOUNTER — Encounter (HOSPITAL_COMMUNITY): Admission: RE | Admit: 2019-11-29 | Payer: PPO | Source: Ambulatory Visit

## 2019-11-30 ENCOUNTER — Other Ambulatory Visit: Payer: Self-pay

## 2019-11-30 ENCOUNTER — Encounter (HOSPITAL_COMMUNITY)
Admission: RE | Admit: 2019-11-30 | Discharge: 2019-11-30 | Disposition: A | Discharge status: Home / Self Care | Payer: PPO | Source: Ambulatory Visit | Attending: Family Medicine | Admitting: Family Medicine

## 2019-11-30 ENCOUNTER — Encounter (HOSPITAL_COMMUNITY): Payer: Self-pay

## 2019-11-30 DIAGNOSIS — D500 Iron deficiency anemia secondary to blood loss (chronic): Secondary | ICD-10-CM | POA: Diagnosis not present

## 2019-11-30 MED ORDER — SODIUM CHLORIDE 0.9 % IV SOLN
510.0000 mg | Freq: Once | INTRAVENOUS | Status: AC
  Administered 2019-11-30: 510 mg via INTRAVENOUS
  Filled 2019-11-30: qty 510

## 2019-11-30 MED ORDER — SODIUM CHLORIDE 0.9 % IV SOLN: Freq: Once | INTRAVENOUS | Status: AC

## 2019-12-07 ENCOUNTER — Other Ambulatory Visit: Payer: Self-pay | Admitting: Family Medicine

## 2020-01-07 ENCOUNTER — Ambulatory Visit: Payer: PPO | Admitting: Cardiovascular Disease

## 2020-01-16 ENCOUNTER — Telehealth: Payer: Self-pay | Admitting: Gastroenterology

## 2020-01-16 NOTE — Telephone Encounter (Signed)
Following up on patient. We have been awaiting input from surgery but looks like she refused referral. She also did not answer attempts from hematology for appt. She has been getting iron infusions directed by her PCP.  Please let pt know we would offer her follow up here for  IDA and consideration of colonoscopy if agreeable (as part of IDA work up).

## 2020-01-17 NOTE — Telephone Encounter (Signed)
Spoke with pt's daughter [name]. [name] was notified several attemps weren't answered for hematology and pt refused surgical referral. Pt has just started living with her daughter [name] and [name] now has POA of her mother. [name] would like to know Kendra Kocher PA thoughts on what's the best thing for pt to moving forward. [name] expressed that her sister wasn't doing the right thing concerning her mother and pt is now being cared for by her. Please advise if you want to start with referrals again or bring pt in for a follow up. [name] is aware that Kendra Coast, PA isn't in the office and our office is closed tomorrow. Will discuss further when Kendra Coast, PA returns.

## 2020-01-17 NOTE — Telephone Encounter (Signed)
Lmom, waiting on a return call.  

## 2020-01-21 NOTE — Telephone Encounter (Signed)
We should probably start with OV since we have not seen her in six months.

## 2020-01-21 NOTE — Telephone Encounter (Signed)
Lmom for [name]. Waiting on a return call.

## 2020-01-23 NOTE — Telephone Encounter (Signed)
Pts daughter returned call. Appointment was scheduled apt for 03/04/2020.

## 2020-01-23 NOTE — Telephone Encounter (Signed)
Left a detailed message for pts daughter to call back to schedule appointment.

## 2020-02-27 DIAGNOSIS — D509 Iron deficiency anemia, unspecified: Secondary | ICD-10-CM | POA: Diagnosis not present

## 2020-02-27 DIAGNOSIS — E538 Deficiency of other specified B group vitamins: Secondary | ICD-10-CM | POA: Diagnosis not present

## 2020-02-27 DIAGNOSIS — I4821 Permanent atrial fibrillation: Secondary | ICD-10-CM | POA: Diagnosis not present

## 2020-02-27 DIAGNOSIS — Z8616 Personal history of COVID-19: Secondary | ICD-10-CM | POA: Diagnosis not present

## 2020-02-27 DIAGNOSIS — Z23 Encounter for immunization: Secondary | ICD-10-CM | POA: Diagnosis not present

## 2020-03-04 ENCOUNTER — Ambulatory Visit (INDEPENDENT_AMBULATORY_CARE_PROVIDER_SITE_OTHER): Payer: PPO | Admitting: Gastroenterology

## 2020-03-04 ENCOUNTER — Ambulatory Visit (INDEPENDENT_AMBULATORY_CARE_PROVIDER_SITE_OTHER): Payer: PPO | Admitting: Cardiovascular Disease

## 2020-03-04 ENCOUNTER — Encounter: Payer: Self-pay | Admitting: Cardiovascular Disease

## 2020-03-04 ENCOUNTER — Other Ambulatory Visit: Payer: Self-pay

## 2020-03-04 ENCOUNTER — Encounter: Payer: Self-pay | Admitting: Gastroenterology

## 2020-03-04 VITALS — BP 144/92 | HR 116 | Temp 97.0°F | Ht 65.0 in | Wt 182.0 lb

## 2020-03-04 VITALS — BP 128/84 | HR 112 | Ht 65.0 in | Wt 186.2 lb

## 2020-03-04 DIAGNOSIS — K5900 Constipation, unspecified: Secondary | ICD-10-CM | POA: Diagnosis not present

## 2020-03-04 DIAGNOSIS — K219 Gastro-esophageal reflux disease without esophagitis: Secondary | ICD-10-CM

## 2020-03-04 DIAGNOSIS — R112 Nausea with vomiting, unspecified: Secondary | ICD-10-CM | POA: Diagnosis not present

## 2020-03-04 DIAGNOSIS — R109 Unspecified abdominal pain: Secondary | ICD-10-CM | POA: Diagnosis not present

## 2020-03-04 DIAGNOSIS — D509 Iron deficiency anemia, unspecified: Secondary | ICD-10-CM | POA: Diagnosis not present

## 2020-03-04 DIAGNOSIS — K439 Ventral hernia without obstruction or gangrene: Secondary | ICD-10-CM | POA: Diagnosis not present

## 2020-03-04 DIAGNOSIS — I4819 Other persistent atrial fibrillation: Secondary | ICD-10-CM

## 2020-03-04 MED ORDER — METOPROLOL SUCCINATE ER 25 MG PO TB24: 25.0000 mg | ORAL_TABLET | Freq: Every day | ORAL | 3 refills | Status: DC

## 2020-03-04 NOTE — Progress Notes (Deleted)
CARDIOLOGY CONSULT NOTE       Patient ID: [name] MRN: [id number] DOB/AGE: Jun 22, 1945 75 y.o.  Referring Physician: Karie Kirks Primary Physician: Lemmie Evens, MD Primary Cardiologist: Domenic Polite Reason for Consultation: Afib  Active Problems:   * No active hospital problems. *   HPI:  75 y.o. patient of Dr Domenic Polite seen 06/09/19. I last saw patient in 2014. History of HTN, HLD, palpitations  And newly documented PAF when seen by Dr Domenic Polite May 2021 Admitted with nausea and vomiting TTE normal EF 55-60% LA severely dilated 51 mm mild MR  Mali VASC 4 She is sedentary due to chronic back pain She has family history premature CAD, previous smoker more than 20 years ago. History of anemia and GI bleed 2017. She has had previous gastric bypass bilroth 2. EGD 06/13/19 no lesions noted by Dr Gala Romney During hospitalization Hb 8.4 Afib was controlled with cardizem Dr Domenic Polite felt rate control indicated no conversion and eliquis held for GI w/u AV nodal drugs included cardizem 240 mg daily and lopressor 12.5 bid   She has had ferheme does not tolerate oral iron Last colonoscopy was in 2016 Some chronic right Sided abdominal pain from lateral abdominal wall hernia  She is in need of back surgery ***  ROS All other systems reviewed and negative except as noted above  Past Medical History:  Diagnosis Date  . Anemia   . Anxiety   . Arthritis   . Atrial fibrillation ([place])   . Chronic back pain    Spondylosis and stenosis  . Depression   . GERD (gastroesophageal reflux disease)   . Headache(784.0)   . Hiatal hernia   . History of blood transfusion   . History of GI bleed   . Hypothyroidism   . Nephrolithiasis   . Obsessive-compulsive disorder   . Recurrent upper respiratory infection (URI)   . Sleep apnea     Family History  Problem Relation Age of Onset  . Anxiety disorder Mother   . Breast cancer Mother   . Anxiety disorder Sister   . Bipolar disorder Daughter   . OCD  Daughter   . ADD / ADHD Daughter   . Heart attack Father   . Breast cancer Sister   . Anesthesia problems Neg Hx   . Hypotension Neg Hx   . Malignant hyperthermia Neg Hx   . Pseudochol deficiency Neg Hx   . Dementia Neg Hx   . Alcohol abuse Neg Hx   . Drug abuse Neg Hx   . Depression Neg Hx   . Paranoid behavior Neg Hx   . Schizophrenia Neg Hx   . Seizures Neg Hx   . Sexual abuse Neg Hx   . Physical abuse Neg Hx   . Colon cancer Neg Hx     Social History   Socioeconomic History  . Marital status: Widowed    Spouse name: Not on file  . Number of children: 4  . Years of education: Not on file  . Highest education level: Not on file  Occupational History  . Not on file  Tobacco Use  . Smoking status: Former Smoker    Packs/day: 1.00    Years: 30.00    Pack years: 30.00    Types: Cigarettes    Quit date: 01/18/1998    Years since quitting: 22.1  . Smokeless tobacco: Never Used  Vaping Use  . Vaping Use: Never used  Substance and Sexual Activity  . Alcohol use: No  .  Drug use: No  . Sexual activity: Not on file  Other Topics Concern  . Not on file  Social History Narrative  . Not on file   Social Determinants of Health   Financial Resource Strain: Not on file  Food Insecurity: Not on file  Transportation Needs: Not on file  Physical Activity: Not on file  Stress: Not on file  Social Connections: Not on file  Intimate Partner Violence: Not on file    Past Surgical History:  Procedure Laterality Date  . ABDOMINAL HYSTERECTOMY    . ANTERIOR CERVICAL DECOMP/DISCECTOMY FUSION  12/09/2010   Procedure: ANTERIOR CERVICAL DECOMPRESSION/DISCECTOMY FUSION 3 LEVELS;  Surgeon: Ophelia Charter;  Location: [facility];  Service: Neurosurgery;  Laterality: N/A;  Cervical three-four,Cervical four-five,Cervical Five-Six,Cervical Six-Seven ANTERIOR CERVICAL DECOMPRESSION WITH FUSION INTERBODY PROTHESIS PLATING AND BONEGRAFT  . APPENDECTOMY    . BIOPSY N/A 11/14/2014    Procedure: GASTRIC BIOPSY;  Surgeon: Daneil Dolin, MD;  Location: [facility];  Service: Endoscopy;  Laterality: N/A;  . CESAREAN SECTION    . CHOLECYSTECTOMY    . COLONOSCOPY WITH PROPOFOL N/A 11/14/2014   RMR: redundant colon but otherwise normal  . ESOPHAGOGASTRODUODENOSCOPY (EGD) WITH PROPOFOL N/A 11/14/2014   RMR"s/p gastric surgery/anastomtic ulcer  . ESOPHAGOGASTRODUODENOSCOPY (EGD) WITH PROPOFOL N/A 09/15/2017   Procedure: ESOPHAGOGASTRODUODENOSCOPY (EGD) WITH PROPOFOL;  Surgeon: Daneil Dolin, MD;  [facility];  Service: Endoscopy;  Laterality: N/A;  11:30am  . ESOPHAGOGASTRODUODENOSCOPY (EGD) WITH PROPOFOL N/A 04/04/2019   2 cm ulcer crater at anastomosis with smaller satellite areas of ulceration in setting of NSAIDs/Goody powders, no bleeding stigmata.  . ESOPHAGOGASTRODUODENOSCOPY (EGD) WITH PROPOFOL N/A 06/13/2019   Rourk: Prior gastric bypass surgery with Billroth II type anatomy, otherwise normal-appearing residual upper GI tract.  [name] EYE SURGERY     bilateral cataracts removed, w/IOL  . GASTRIC BYPASS  2007  . JOINT REPLACEMENT     07/2010 &  2002- respectively- both knees   . MALONEY DILATION N/A 09/15/2017   Procedure: Venia Minks DILATION;  Surgeon: Daneil Dolin, MD;  [facility];  Service: Endoscopy;  Laterality: N/A;  . PARATHYROIDECTOMY    . PERIPHERALLY INSERTED CENTRAL CATHETER INSERTION     PICC line for treatment of MRSA  . TONSILLECTOMY     as a child      Current Outpatient Medications:  .  apixaban (ELIQUIS) 5 MG TABS tablet, Take 1 tablet (5 mg total) by mouth 2 (two) times daily. Start this medication after back surgery (Patient not taking: Reported on 06/26/2019), Disp: 60 tablet, Rfl: 0 .  Cholecalciferol (VITAMIN D3) 1.25 MG (50000 UT) CAPS, Take 1 capsule by mouth once a week., Disp: , Rfl:  .  Cyanocobalamin (VITAMIN B-12 IJ), Inject 1 Applicatorful as directed every 30 (thirty) days. , Disp: , Rfl:  .  diclofenac Sodium (VOLTAREN) 1  % GEL, Apply 2 g topically 4 (four) times daily. , Disp: , Rfl:  .  diltiazem (CARDIZEM CD) 240 MG 24 hr capsule, Take 1 capsule (240 mg total) by mouth daily., Disp: 90 capsule, Rfl: 3 .  furosemide (LASIX) 40 MG tablet, Take 40 mg by mouth as needed., Disp: , Rfl:  .  levothyroxine (SYNTHROID, LEVOTHROID) 75 MCG tablet, Take 75 mcg by mouth daily before breakfast., Disp: , Rfl:  .  lubiprostone (AMITIZA) 24 MCG capsule, Take 1 capsule (24 mcg total) by mouth 2 (two) times daily with a meal., Disp: 60 capsule, Rfl: 3 .  metoprolol tartrate (LOPRESSOR) 25 MG tablet, TAKE 1/2 TABLET(12.5 MG) BY MOUTH TWICE DAILY, Disp: 15 tablet, Rfl: 0 .  ondansetron (ZOFRAN ODT) 4 MG disintegrating tablet, Take 1 tablet (4 mg total) by mouth every 4 (four) hours as needed for nausea or vomiting., Disp: 30 tablet, Rfl: 1 .  ondansetron (ZOFRAN) 4 MG tablet, TAKE 1 TABLET BY MOUTH THREE TIMES DAILY AS NEEDED, Disp: 30 tablet, Rfl: 5 .  oxyCODONE-acetaminophen (PERCOCET) 5-325 MG tablet, Take 1 tablet by mouth every 6 (six) hours as needed for severe pain., Disp: 15 tablet, Rfl: 0 .  pantoprazole (PROTONIX) 40 MG tablet, Take 1 tablet (40 mg total) by mouth 2 (two) times daily before a meal., Disp: 60 tablet, Rfl: 11 .  venlafaxine (EFFEXOR) 75 MG tablet, Take 75 mg by mouth 2 (two) times daily. , Disp: , Rfl:  .  Vitamin D, Ergocalciferol, (DRISDOL) 50000 units CAPS capsule, Take 50,000 Units by mouth every 7 (seven) days. Fridays, Disp: , Rfl:     Physical Exam: There were no vitals taken for this visit.    Affect appropriate Healthy:  appears stated age 73: normal Neck supple with no adenopathy JVP normal no bruits no thyromegaly Lungs clear with no wheezing and good diaphragmatic motion Heart:  S1/S2 no murmur, no rub, gallop or click PMI normal Abdomen: benighn, BS positve, no tenderness, no AAA no bruit.  No HSM or HJR Distal pulses intact with no bruits No edema Neuro non-focal Skin warm and  dry No muscular weakness   Labs:   Lab Results  Component Value Date   WBC 6.9 06/14/2019   HGB 8.3 (L) 06/14/2019   HCT 28.4 (L) 06/14/2019   MCV 78.5 (L) 06/14/2019   PLT 261 06/14/2019   No results for input(s): NA, K, CL, CO2, BUN, CREATININE, CALCIUM, PROT, BILITOT, ALKPHOS, ALT, AST, GLUCOSE in the last 168 hours.  Invalid input(s): LABALBU Lab Results  Component Value Date   CKTOTAL 54 01/16/2010   CKMB 0.4 01/16/2010   TROPONINI <0.30 05/09/2012   No results found for: CHOL No results found for: HDL No results found for: LDLCALC No results found for: TRIG No results found for: CHOLHDL No results found for: LDLDIRECT    Radiology: No results found.  EKG: 07/13/19 afib nonspecific ST changes    ASSESSMENT AND PLAN:   1. Afib:  *** 2. GI:  *** 3. Anemia *** 4. Thyroid: *** 5. Ortho:  ***  Signed: Jenkins Rouge 03/04/2020, 8:49 AM

## 2020-03-04 NOTE — Progress Notes (Signed)
CARDIOLOGY CONSULT NOTE       Patient ID: [name] MRN: [id number] DOB/AGE: July 08, 1945 75 y.o.  Referring Physician: Karie Kirks Primary Physician: Lemmie Evens, MD Primary Cardiologist: Domenic Polite Reason for Consultation: Afib  Active Problems:   * No active hospital problems. *   HPI:  75 y.o. last seen by Dr Domenic Polite in May 2021 I have not seen her since 2014. At that time she had palpitations With no documented arrhythmia 06/11/19 Seen by SM for afib.  History of HTN, HLD This was in setting of nausea and vomiting She has had previous gastric bypass and healed anastomotic ulcer. More recent EGD showed no lesions last colonoscopy 2016 Sees [facility] for GI. Had anemia with Hb as low as 8.4 Now improved TTE with normal EF LAE 51 mm Rate control and anticoagulation strategy adopted once Hb stable On Eliquis and cardizem Has chronic back issues and may need surgery   She is being cared for by another daughter now Previous daughter moved and seems like she had Neglected mother She has no cardiac symptoms She is improving with iron Still needs B12 shots And w/u for anemia Saw GI this morning but they have not arranged EGD/colon yet   ROS All other systems reviewed and negative except as noted above  Past Medical History:  Diagnosis Date  . Anemia   . Anxiety   . Arthritis   . Atrial fibrillation ([place])   . Chronic back pain    Spondylosis and stenosis  . Depression   . GERD (gastroesophageal reflux disease)   . Headache(784.0)   . Hiatal hernia   . History of blood transfusion   . History of GI bleed   . Hypothyroidism   . Nephrolithiasis   . Obsessive-compulsive disorder   . Recurrent upper respiratory infection (URI)   . Sleep apnea     Family History  Problem Relation Age of Onset  . Anxiety disorder Mother   . Breast cancer Mother   . Anxiety disorder Sister   . Bipolar disorder Daughter   . OCD Daughter   . ADD / ADHD Daughter   . Heart attack Father   .  Breast cancer Sister   . Anesthesia problems Neg Hx   . Hypotension Neg Hx   . Malignant hyperthermia Neg Hx   . Pseudochol deficiency Neg Hx   . Dementia Neg Hx   . Alcohol abuse Neg Hx   . Drug abuse Neg Hx   . Depression Neg Hx   . Paranoid behavior Neg Hx   . Schizophrenia Neg Hx   . Seizures Neg Hx   . Sexual abuse Neg Hx   . Physical abuse Neg Hx   . Colon cancer Neg Hx     Social History   Socioeconomic History  . Marital status: Widowed    Spouse name: Not on file  . Number of children: 4  . Years of education: Not on file  . Highest education level: Not on file  Occupational History  . Not on file  Tobacco Use  . Smoking status: Former Smoker    Packs/day: 1.00    Years: 30.00    Pack years: 30.00    Types: Cigarettes    Quit date: 01/18/1998    Years since quitting: 22.1  . Smokeless tobacco: Never Used  Vaping Use  . Vaping Use: Never used  Substance and Sexual Activity  . Alcohol use: No  . Drug use: No  . Sexual activity: Not  on file  Other Topics Concern  . Not on file  Social History Narrative  . Not on file   Social Determinants of Health   Financial Resource Strain: Not on file  Food Insecurity: Not on file  Transportation Needs: Not on file  Physical Activity: Not on file  Stress: Not on file  Social Connections: Not on file  Intimate Partner Violence: Not on file    Past Surgical History:  Procedure Laterality Date  . ABDOMINAL HYSTERECTOMY    . ANTERIOR CERVICAL DECOMP/DISCECTOMY FUSION  12/09/2010   Procedure: ANTERIOR CERVICAL DECOMPRESSION/DISCECTOMY FUSION 3 LEVELS;  Surgeon: Ophelia Charter;  Location: [facility];  Service: Neurosurgery;  Laterality: N/A;  Cervical three-four,Cervical four-five,Cervical Five-Six,Cervical Six-Seven ANTERIOR CERVICAL DECOMPRESSION WITH FUSION INTERBODY PROTHESIS PLATING AND BONEGRAFT  . APPENDECTOMY    . BIOPSY N/A 11/14/2014   Procedure: GASTRIC BIOPSY;  Surgeon: Daneil Dolin, MD;  Location:  [facility];  Service: Endoscopy;  Laterality: N/A;  . CESAREAN SECTION    . CHOLECYSTECTOMY    . COLONOSCOPY WITH PROPOFOL N/A 11/14/2014   RMR: redundant colon but otherwise normal  . ESOPHAGOGASTRODUODENOSCOPY (EGD) WITH PROPOFOL N/A 11/14/2014   RMR"s/p gastric surgery/anastomtic ulcer  . ESOPHAGOGASTRODUODENOSCOPY (EGD) WITH PROPOFOL N/A 09/15/2017   Procedure: ESOPHAGOGASTRODUODENOSCOPY (EGD) WITH PROPOFOL;  Surgeon: Daneil Dolin, MD;  Location: [facility];  Service: Endoscopy;  Laterality: N/A;  11:30am  . ESOPHAGOGASTRODUODENOSCOPY (EGD) WITH PROPOFOL N/A 04/04/2019   2 cm ulcer crater at anastomosis with smaller satellite areas of ulceration in setting of NSAIDs/Goody powders, no bleeding stigmata.  . ESOPHAGOGASTRODUODENOSCOPY (EGD) WITH PROPOFOL N/A 06/13/2019   [facility]: Prior gastric bypass surgery with Billroth II type anatomy, otherwise normal-appearing residual upper GI tract.  [name] EYE SURGERY     bilateral cataracts removed, w/IOL  . GASTRIC BYPASS  2007  . JOINT REPLACEMENT     07/2010 &  2002- respectively- both knees   . MALONEY DILATION N/A 09/15/2017   Procedure: Venia Minks DILATION;  Surgeon: Daneil Dolin, MD;  Location: [facility];  Service: Endoscopy;  Laterality: N/A;  . PARATHYROIDECTOMY    . PERIPHERALLY INSERTED CENTRAL CATHETER INSERTION     PICC line for treatment of MRSA  . TONSILLECTOMY     as a child      Current Outpatient Medications:  .  apixaban (ELIQUIS) 5 MG TABS tablet, Take 1 tablet (5 mg total) by mouth 2 (two) times daily. Start this medication after back surgery (Patient not taking: No sig reported), Disp: 60 tablet, Rfl: 0 .  Cyanocobalamin (VITAMIN B-12 IJ), Inject 1 Applicatorful as directed every 30 (thirty) days., Disp: , Rfl:  .  diltiazem (CARDIZEM CD) 240 MG 24 hr capsule, Take 1 capsule (240 mg total) by mouth daily., Disp: 90 capsule, Rfl: 3 .  levothyroxine (SYNTHROID, LEVOTHROID) 75 MCG tablet, Take 75 mcg by mouth daily before  breakfast., Disp: , Rfl:  .  lubiprostone (AMITIZA) 24 MCG capsule, Take 1 capsule (24 mcg total) by mouth 2 (two) times daily with a meal., Disp: 60 capsule, Rfl: 3 .  ondansetron (ZOFRAN) 4 MG tablet, TAKE 1 TABLET BY MOUTH THREE TIMES DAILY AS NEEDED, Disp: 30 tablet, Rfl: 5 .  pantoprazole (PROTONIX) 40 MG tablet, Take 1 tablet (40 mg total) by mouth 2 (two) times daily before a meal., Disp: 60 tablet, Rfl: 11 .  pantoprazole (PROTONIX) 40 MG tablet, Take 40 mg by mouth 2 (two) times daily before a meal., Disp: , Rfl:  .  venlafaxine (EFFEXOR) 75 MG tablet, Take 75 mg by mouth 2 (two) times daily. , Disp: , Rfl:  .  Vitamin D, Ergocalciferol, (DRISDOL) 50000 units CAPS capsule, Take 50,000 Units by mouth every 7 (seven) days. Fridays, Disp: , Rfl:     Physical Exam: There were no vitals taken for this visit.   Affect appropriate Healthy:  appears stated age 22: normal Neck supple with no adenopathy JVP normal no bruits no thyromegaly Lungs clear with no wheezing and good diaphragmatic motion Heart:  S1/S2 no murmur, no rub, gallop or click PMI normal Abdomen: benighn, BS positve, no tenderness, no AAA no bruit.  No HSM or HJR Distal pulses intact with no bruits No edema Neuro non-focal Skin warm and dry No muscular weakness   Labs:   Lab Results  Component Value Date   WBC 6.9 06/14/2019   HGB 8.3 (L) 06/14/2019   HCT 28.4 (L) 06/14/2019   MCV 78.5 (L) 06/14/2019   PLT 261 06/14/2019   No results for input(s): NA, K, CL, CO2, BUN, CREATININE, CALCIUM, PROT, BILITOT, ALKPHOS, ALT, AST, GLUCOSE in the last 168 hours.  Invalid input(s): LABALBU Lab Results  Component Value Date   CKTOTAL 54 01/16/2010   CKMB 0.4 01/16/2010   TROPONINI <0.30 05/09/2012   No results found for: CHOL No results found for: HDL No results found for: LDLCALC No results found for: TRIG No results found for: CHOLHDL No results found for: LDLDIRECT    Radiology: No results  found.  EKG: 07/13/19 afib rate 101 nonspecific ST changes    ASSESSMENT AND PLAN:   1. PAF:  On eliquis and cardizem LAE with dimension 51 mm Still not a candidate for anticoagulation until Anemia/ GI w/u done Add Toprol 25 mg daily for better rate control Eventually may end up being a Watchman candidate  2. Anemia: no obvious recent source ? Needs f/u colonoscopy last being 6 years ago per GI  3. Thyroid:  On replacement TSH normal  4. Anxiety/Depression:  Continue Effexor    F/U with Dr Domenic Polite in 6-8 weeks  Toprol 25 mg added No anticoagulation yet   Signed: Jenkins Rouge 03/04/2020, 11:08 AM

## 2020-03-04 NOTE — Progress Notes (Addendum)
Primary Care Physician: Lemmie Evens, MD  Primary Gastroenterologist:  Garfield Cornea, MD   Chief Complaint  Patient presents with   Nausea    W/ some vomiting, daily, off/on   Abdominal Pain    Comes/goes   diarrhea/constipation    alternates    HPI: [name] is a 75 y.o. female here for follow-up of IDA and abdominal pain. Also for consideration for colonoscopy if agreeable.  Patient last seen in July.  She has a history of gastric bypass in 2007, IDA, anastomotic ulcer in 2016, history of right lateral abdominal wall hernia, history of small right-sided spigelian hernia without bowel involvement (not reported but reviewed with radiology and seen on March 2021 CT as well as previous studies), inpatient admission March 2021 with abdominal pain status post EGD with 2 cm ulcer crater at the anastomosis with smaller satellite areas of ulceration in the setting of NSAID/Goody powders.  Patient evaluated by Dr. Constance Haw back in March 2021 and she did not feel there was any indication for surgery.Presented to the hospital again in May 2021 complaining of migratory abdominal pain predominantly in the lower abdomen.  Pain worse from her baseline, aggravated by movement and associated with vomiting.  Continues to complain of bulging in the right mid abdomen associated with pain and known small spigelian hernia seen on previous imaging studies.  Repeat CT scan in May with no appreciable abdominal wall hernia on that exam, no evidence of internal hernia.  Repeat EGD showed prior gastric bypass surgery with Billroth II type anatomy. Otherwise normal-appearing residual upper GI tract.  Nothing to explain her pain.  She was advised outpatient evaluation with hematology for chronic IDA and need for iron infusions as well as to follow-up with the bariatric surgeon for ongoing abdominal pain and vomiting.  Patient back today, lost in follow-up.  She is now living her daughter [name]. Moved in  about four months ago. Trying to get her healthcare back on track. Patient states she has been off Eliquis for months. She stopped it in preparation for back surgery last year but surgery put on hold. She never resumed. [name] states she has follow up with cardiologist this week.   Patient has lost 40 pounds since 11/2019. [name] states they eat very healthy at her house. Very limited sweets/junk food. Likely reason for weight loss. Patient does have ongoing vomiting every day. Can come out of the blue. Sometimes vomits food. Sometimes vomits white mucous. Takes Zofran three times daily. She continues to have right sided abdominal pain. Pain at site of right mid abd bulge. She states she has new bulge in right lower abdomen. BM alternate between constipation and diarrhea. More constipation though. Usually goes 1-2 weeks without a BM, then will have diarrhea. Does not take Amitiza on regular basis. Feels nauseated when she has to have BM. Denies nausea related to amitiza. No melena, brbpr. Has a lot of heartburn.   Daughter noted no symptoms before gastric.  Family had Covid 01/2019. Multiple household contacts with covid last month. Patient never got sick other than minor cough. Did not get tested.   Received 2 iron infusions in 11/2019. Had a lot more energy after. Able to do more around the house. Last labs in Nov/Dec. Due for labs this week for B12. Has been off injections for quite some times.   02/2019: stable fat containing right lateral abdominal wall hernia.  07/2017: small fat containing right spigelian hernia  Current Outpatient  Medications  Medication Sig Dispense Refill          levothyroxine (SYNTHROID, LEVOTHROID) 75 MCG tablet Take 75 mcg by mouth daily before breakfast.     lubiprostone (AMITIZA) 24 MCG capsule Take 1 capsule (24 mcg total) by mouth 2 (two) times daily with a meal. 60 capsule 3   ondansetron (ZOFRAN) 4 MG tablet TAKE 1 TABLET BY MOUTH THREE TIMES DAILY AS NEEDED  30 tablet 5   pantoprazole (PROTONIX) 40 MG tablet Take 1 tablet (40 mg total) by mouth 2 (two) times daily before a meal. 60 tablet 11   venlafaxine (EFFEXOR) 75 MG tablet Take 75 mg by mouth 2 (two) times daily.                 0   Cholecalciferol (VITAMIN D3) 1.25 MG (50000 UT) CAPS Take 1 capsule by mouth once a week. (Patient not taking: Reported on 03/04/2020)     Cyanocobalamin (VITAMIN B-12 IJ) Inject 1 Applicatorful as directed every 30 (thirty) days.  (Patient not taking: Reported on 03/04/2020)     No current facility-administered medications for this visit.    Allergies as of 03/04/2020 - Review Complete 03/04/2020  Allergen Reaction Noted   Benzodiazepines Other (See Comments) 12/03/2011   Codeine Nausea Only    Escitalopram oxalate Nausea Only 12/02/2010   Nsaids Other (See Comments) 12/22/2012   Sertraline hcl Nausea Only    Statins Nausea And Vomiting    Tramadol hcl Other (See Comments) 12/22/2012    ROS:  General: Negative for anorexia,  fever, chills, fatigue, weakness. See hpi ENT: Negative for hoarseness, difficulty swallowing , nasal congestion. CV: Negative for chest pain, angina, palpitations, dyspnea on exertion, peripheral edema.  Respiratory: Negative for dyspnea at rest, dyspnea on exertion, cough, sputum, wheezing.  GI: See history of present illness. GU:  Negative for dysuria, hematuria, urinary incontinence, urinary frequency, nocturnal urination.  Endo: Negative for unusual weight change. See hpi   Physical Examination:   BP (!) 144/92    Pulse (!) 116    Temp (!) 97 F (36.1 C)    Ht 5\' 5"  (1.651 m)    Wt 182 lb (82.6 kg)    BMI 30.29 kg/m   General: Well-nourished, well-developed in no acute distress. Accompanied by daughter, [name] Eyes: No icterus. Mouth: masked Lungs: Clear to auscultation bilaterally.  Heart: Regular rate and rhythm, no murmurs rubs or gallops.  Abdomen: Bowel sounds are normal,  nondistended, no  hepatosplenomegaly or masses, no abdominal bruits., no rebound or guarding.  Small bulge right mid abd. Tender in right mid abdomen diffuse. Did not appreciate lower abdominal bulging.  Extremities: No lower extremity edema. No clubbing or deformities. Neuro: Alert and oriented x 4   Skin: Warm and dry, no jaundice.   Psych: Alert and cooperative, normal mood and affect.  Labs:  Lab Results  Component Value Date   CREATININE 1.24 (H) 06/14/2019   BUN 13 06/14/2019   NA 139 06/14/2019   K 4.3 06/14/2019   CL 103 06/14/2019   CO2 27 06/14/2019   Lab Results  Component Value Date   ALT 9 06/09/2019   AST 16 06/09/2019   ALKPHOS 61 06/09/2019   BILITOT 0.7 06/09/2019   Lab Results  Component Value Date   IRON 20 (L) 06/10/2019   TIBC 436 06/10/2019   FERRITIN 5 (L) 06/10/2019   Lab Results  Component Value Date   VITAMINB12 1,022 (H) 04/04/2019   Lab Results  Component Value Date   FOLATE 7.8 04/22/2015   Lab Results  Component Value Date   WBC 6.9 06/14/2019   HGB 8.3 (L) 06/14/2019   HCT 28.4 (L) 06/14/2019   MCV 78.5 (L) 06/14/2019   PLT 261 06/14/2019    Imaging Studies: No results found.  Assessment:  75 y/o female with h/o gastric bypass in 2007, IDA, anastomotic ulcer in 2016 and 03/2019, h/o right lateral abdominal wall hernia, h/o small right sided spigelian hernia presenting for follow up abdominal pain, N/V, constipation, GERD.   Right sided abdominal pain: occurring for several years. Located at site of reproducible bulge and associated with N/V. Bulge comes and goes. CT has shown both right lateral abdominal wall hernia and right spigelian hernia (02/2019 and 07/2017 CTs). Findings have not be commented on all CT reports but reviewed with radiologist and note on CT 03/2019 as well. It is not been clear if all of her abdominal pain and N/V all related to this or if some other etiology possibly ie complication of gastric bypass. Previously advised to see  bariatric surgery but patient declined last year (stating her other daughter was not supportive of this). EGD 05/2019 showed healed anastomotic ulcer. Patient denies NSAIDs/ASA. Obtain most recent labs. May require repeat CT to evaluate   IDA: likely due to malabsorption in setting of previous gastric bypass. Recent iron infusions as ordered by PCP (11/2019). Will request latest labs. Her last colonoscopy was in 2016, may need to update if persisting or progressive IDA.   Constipation: recommend taking amitiza on regular basis, holding only for diarrhea.   GERD: continue PPI BID. Reinforced antireflux measures.

## 2020-03-04 NOTE — Patient Instructions (Addendum)
Medication Instructions:  START Toprol XL 25 mg daily   *If you need a refill on your cardiac medications before your next appointment, please call your pharmacy*   Lab Work: None today If you have labs (blood work) drawn today and your tests are completely normal, you will receive your results only by: [name] MyChart Message (if you have MyChart) OR . A paper copy in the mail If you have any lab test that is abnormal or we need to change your treatment, we will call you to review the results.   Testing/Procedures: None today    Follow-Up: At [facility], you and your health needs are our priority.  As part of our continuing mission to provide you with exceptional heart care, we have created designated Provider Care Teams.  These Care Teams include your primary Cardiologist (physician) and Advanced Practice Providers (APPs -  Physician Assistants and Nurse Practitioners) who all work together to provide you with the care you need, when you need it.  We recommend signing up for the patient portal called "MyChart".  Sign up information is provided on this After Visit Summary.  MyChart is used to connect with patients for Virtual Visits (Telemedicine).  Patients are able to view lab/test results, encounter notes, upcoming appointments, etc.  Non-urgent messages can be sent to your provider as well.   To learn more about what you can do with MyChart, go to [email or web address].    Your next appointment:   6 -8 weeks   The format for your next appointment:   In Person  Provider:   You may see Rozann Lesches, MD or one of the following Advanced Practice Providers on your designated Care Team:    Bernerd Pho, PA-C   Ermalinda Barrios, PA-C     Other Instructions None         Thank you for choosing [facility] !

## 2020-03-04 NOTE — Patient Instructions (Signed)
1. We will get copy of recent labs from Dr. Vickey Sages office.  2. We will be in touch to discuss next step, possible CT scan to evaluate abdominal pain and possible colonoscopy to further evaluate iron deficiency anemia.

## 2020-03-07 ENCOUNTER — Ambulatory Visit: Payer: PPO | Admitting: Cardiovascular Disease

## 2020-03-17 ENCOUNTER — Telehealth: Payer: Self-pay | Admitting: Gastroenterology

## 2020-03-17 ENCOUNTER — Other Ambulatory Visit: Payer: Self-pay

## 2020-03-17 ENCOUNTER — Encounter (HOSPITAL_COMMUNITY): Payer: Self-pay

## 2020-03-17 ENCOUNTER — Encounter (HOSPITAL_COMMUNITY)
Admission: RE | Admit: 2020-03-17 | Discharge: 2020-03-17 | Disposition: A | Discharge status: Home / Self Care | Payer: PPO | Source: Ambulatory Visit | Attending: Family Medicine | Admitting: Family Medicine

## 2020-03-17 DIAGNOSIS — D509 Iron deficiency anemia, unspecified: Secondary | ICD-10-CM | POA: Insufficient documentation

## 2020-03-17 MED ORDER — SODIUM CHLORIDE 0.9 % IV SOLN: Freq: Once | INTRAVENOUS | Status: AC

## 2020-03-17 MED ORDER — SODIUM CHLORIDE 0.9 % IV SOLN
510.0000 mg | Freq: Once | INTRAVENOUS | Status: AC
  Administered 2020-03-17: 510 mg via INTRAVENOUS
  Filled 2020-03-17: qty 17

## 2020-03-17 NOTE — Telephone Encounter (Addendum)
Please request all labs from this year from PCP.    Please let pt know (dgt crystal) that we need to move forward with colonoscopy to evaluate her IDA. Cardiology holding off on Eliquis/anticoagulation until GI work up of anemia completed.   1. Schedule colonoscopy with Dr. Gala Romney with propofol. ASA III. 2. She will need two full days of clear liquids.  3. If she restarts anticoagulation before her procedure date they have to let us know! 4. Reiterate that she has to take ALL the prep, last time she only took dose 1. 5. Would avoid miralax prep due to her constipation. 6. Please find out if her BMs are more frequent now on Amitiza 87mcg bid.

## 2020-03-18 ENCOUNTER — Other Ambulatory Visit: Payer: Self-pay

## 2020-03-18 ENCOUNTER — Telehealth: Payer: Self-pay | Admitting: Cardiovascular Disease

## 2020-03-18 DIAGNOSIS — D509 Iron deficiency anemia, unspecified: Secondary | ICD-10-CM | POA: Diagnosis not present

## 2020-03-18 DIAGNOSIS — E039 Hypothyroidism, unspecified: Secondary | ICD-10-CM | POA: Diagnosis not present

## 2020-03-18 DIAGNOSIS — M064 Inflammatory polyarthropathy: Secondary | ICD-10-CM | POA: Diagnosis not present

## 2020-03-18 DIAGNOSIS — R1031 Right lower quadrant pain: Secondary | ICD-10-CM | POA: Diagnosis not present

## 2020-03-18 DIAGNOSIS — I10 Essential (primary) hypertension: Secondary | ICD-10-CM | POA: Diagnosis not present

## 2020-03-18 DIAGNOSIS — M48062 Spinal stenosis, lumbar region with neurogenic claudication: Secondary | ICD-10-CM | POA: Diagnosis not present

## 2020-03-18 DIAGNOSIS — R229 Localized swelling, mass and lump, unspecified: Secondary | ICD-10-CM | POA: Diagnosis not present

## 2020-03-18 DIAGNOSIS — E538 Deficiency of other specified B group vitamins: Secondary | ICD-10-CM | POA: Diagnosis not present

## 2020-03-18 DIAGNOSIS — E782 Mixed hyperlipidemia: Secondary | ICD-10-CM | POA: Diagnosis not present

## 2020-03-18 DIAGNOSIS — G4700 Insomnia, unspecified: Secondary | ICD-10-CM | POA: Diagnosis not present

## 2020-03-18 DIAGNOSIS — R600 Localized edema: Secondary | ICD-10-CM | POA: Diagnosis not present

## 2020-03-18 DIAGNOSIS — K2100 Gastro-esophageal reflux disease with esophagitis, without bleeding: Secondary | ICD-10-CM | POA: Diagnosis not present

## 2020-03-18 MED ORDER — ATENOLOL 25 MG PO TABS: 25.0000 mg | ORAL_TABLET | Freq: Every day | ORAL | 3 refills | Status: DC

## 2020-03-18 NOTE — Telephone Encounter (Signed)
Toprol was stopped and replaced with Atenolol per Dr. Johnsie Cancel. Daughter was notified that patient needs a f/u with GI and keep f/u with Dr. Domenic Polite in March.

## 2020-03-18 NOTE — Telephone Encounter (Signed)
Lmom for pts daughter crystal, waiting on a return call.   Manuela Schwartz please request labs per Neil Crouch, PA request.

## 2020-03-18 NOTE — Telephone Encounter (Signed)
Requested all labs for Dr Vickey Sages office

## 2020-03-18 NOTE — Telephone Encounter (Signed)
This is Dr McDowell's patient can stop Toprol and try atenolol 25 mg daily she has previous gastric bypass, anemia and needs f/u EGD/colon F/u with them regarding her nausea Patient should f/u with SM

## 2020-03-18 NOTE — Telephone Encounter (Signed)
I spoke with patient. She states she gets very nauseated after taking metoprolol even with food. She took her zofran and had no relief. She is still plagued by palpitations and would like to try something else.

## 2020-03-18 NOTE — Telephone Encounter (Signed)
Daughter called stating that when she was seen by Dr. Johnsie Cancel the medication she was started on is making her sick.  [phone]

## 2020-03-18 NOTE — Telephone Encounter (Signed)
Routing to Susan. 

## 2020-03-18 NOTE — Telephone Encounter (Signed)
Left message to return call 

## 2020-03-24 ENCOUNTER — Encounter (HOSPITAL_COMMUNITY)
Admission: RE | Admit: 2020-03-24 | Discharge: 2020-03-24 | Disposition: A | Discharge status: Home / Self Care | Payer: PPO | Source: Ambulatory Visit | Attending: Family Medicine | Admitting: Family Medicine

## 2020-03-24 MED ORDER — SODIUM CHLORIDE 0.9 % IV SOLN
510.0000 mg | Freq: Once | INTRAVENOUS | Status: DC
  Filled 2020-03-24: qty 17

## 2020-03-24 MED ORDER — SODIUM CHLORIDE 0.9 % IV SOLN: Freq: Once | INTRAVENOUS | Status: DC

## 2020-03-24 NOTE — Telephone Encounter (Signed)
Letter mailed to pt.  

## 2020-03-27 NOTE — Telephone Encounter (Signed)
I have requested the last 3 sets of iron, ferritin, cbc from Dr Vickey Sages office

## 2020-03-27 NOTE — Telephone Encounter (Signed)
Reviewed labs received dated 03/18/2020: Vitamin B12 254 (low end of normal)   Manuela Schwartz, I specifically need iron and CBC labs. Maybe they were only done in 2021. Need last three sets with iron/ferritin/CBC.   Patient has been receiving iron infusions so I feel like there has to be appropriate labs that I have not seen yet.   Still waiting for patient's daughter to call for scheduling.

## 2020-03-31 ENCOUNTER — Encounter: Payer: Self-pay | Admitting: Internal Medicine

## 2020-03-31 ENCOUNTER — Encounter (HOSPITAL_COMMUNITY)
Admission: RE | Admit: 2020-03-31 | Discharge: 2020-03-31 | Disposition: A | Discharge status: Home / Self Care | Payer: PPO | Source: Ambulatory Visit | Attending: Family Medicine | Admitting: Family Medicine

## 2020-03-31 ENCOUNTER — Other Ambulatory Visit: Payer: Self-pay

## 2020-03-31 ENCOUNTER — Encounter (HOSPITAL_COMMUNITY): Payer: Self-pay

## 2020-03-31 ENCOUNTER — Telehealth: Payer: Self-pay | Admitting: Internal Medicine

## 2020-03-31 DIAGNOSIS — D509 Iron deficiency anemia, unspecified: Secondary | ICD-10-CM | POA: Diagnosis not present

## 2020-03-31 MED ORDER — SODIUM CHLORIDE 0.9 % IV SOLN: Freq: Once | INTRAVENOUS | Status: AC

## 2020-03-31 MED ORDER — SODIUM CHLORIDE 0.9 % IV SOLN
510.0000 mg | Freq: Once | INTRAVENOUS | Status: AC
  Administered 2020-03-31: 510 mg via INTRAVENOUS
  Filled 2020-03-31: qty 510

## 2020-03-31 NOTE — Telephone Encounter (Signed)
Noted. Please see other telephone note for TCS instructions.   Patient needs to continue Amitiza 57mcg bid for constipation and to assist with bowel prep.

## 2020-03-31 NOTE — Telephone Encounter (Signed)
[name] had LMOM over the weekend for the nurse to call her. [phone]

## 2020-03-31 NOTE — Telephone Encounter (Signed)
Pt's daughter called back after receiving letter. She wants to schedule TCS per Neil Crouch, PA. Pt is taking Amitiza 24 mcg and her bowels are moving better.   [facility], see prior note for TCS instructions.

## 2020-03-31 NOTE — Telephone Encounter (Signed)
LMOVM for daughter [employer]

## 2020-03-31 NOTE — Telephone Encounter (Signed)
Noted  

## 2020-04-01 ENCOUNTER — Other Ambulatory Visit: Payer: Self-pay | Admitting: *Deleted

## 2020-04-01 MED ORDER — PEG 3350-KCL-NA BICARB-NACL 420 G PO SOLR: ORAL | 0 refills | Status: DC

## 2020-04-01 NOTE — Telephone Encounter (Signed)
Spoke with pt daughter. She has been scheduled for 4/25. Aware will mail prep instructions and hospital will provide time of procedure. Rx sent to pharmacy.

## 2020-04-08 NOTE — Telephone Encounter (Signed)
Received labs from PCP   10/30/2019: Iron 22, iron saturations 5%, ferritin 6, TIBC 430, B12 255, folate 12.4.  05/11/2019: Hemoglobin 9, hematocrit 30, MCV 79.8  03/17/2018: Hemoglobin 11.8, hematocrit 36   Can we arrange for CBC, iron/tibc/ferritin done at time of pre-op for colonoscopy?

## 2020-04-08 NOTE — Telephone Encounter (Signed)
Orders entered and note made for pre-op

## 2020-04-08 NOTE — Telephone Encounter (Signed)
Never received 

## 2020-04-09 NOTE — Telephone Encounter (Signed)
Noted  

## 2020-04-14 NOTE — Progress Notes (Signed)
Cardiology Office Note  Date: 04/15/2020   ID: Kendra, [name] 1945-05-25, MRN [id number]  PCP:  Lemmie Evens, MD  Cardiologist:  Rozann Lesches, MD Electrophysiologist:  None   Chief Complaint  Patient presents with  . Cardiac follow-up    History of Present Illness: [name] is a 75 y.o. female last seen in February by Dr. Johnsie Cancel, I reviewed the note. She is here today with her daughter for follow-up.  Reports intermittent sense of palpitations, no chest pain.  She does not describe any obvious melena or hematochezia.  Work-up of anemia continues.  She has had lab work done with Dr. Karie Kirks, also received an iron infusion in the interim.  She was started on Toprol-XL 25 mg daily at the last visit for better heart rate control, subsequently switched to atenolol 25 mg daily by Dr. Johnsie Cancel.  Still not felt to be a candidate for anticoagulation at that point pending further GI work-up of anemia.    I reviewed her medications in detail.  It seems that she has actually not been on Cardizem CD 240 mg daily which was listed on her last office encounter.  She states that she was told by someone to stop it a few months ago.  She is only taking atenolol 25 mg daily for heart rate control at this point.  She is scheduled for colonoscopy with Dr. Gala Romney in April.  She has a previous history of gastric bypass in 2007 with anastomotic ulcer noted 2016 and more recently healed in March 2021. She has iron deficiency anemia.  I personally reviewed her ECG today which shows atrial fibrillation at 107 bpm, low voltage, nonspecific ST-T changes.  Past Medical History:  Diagnosis Date  . Anemia   . Anxiety   . Arthritis   . Atrial fibrillation ([place])   . Chronic back pain    Spondylosis and stenosis  . Depression   . GERD (gastroesophageal reflux disease)   . Headache(784.0)   . Hiatal hernia   . History of blood transfusion   . History of GI bleed   . Hypothyroidism   .  Nephrolithiasis   . Obsessive-compulsive disorder   . Recurrent upper respiratory infection (URI)   . Sleep apnea     Past Surgical History:  Procedure Laterality Date  . ABDOMINAL HYSTERECTOMY    . ANTERIOR CERVICAL DECOMP/DISCECTOMY FUSION  12/09/2010   Procedure: ANTERIOR CERVICAL DECOMPRESSION/DISCECTOMY FUSION 3 LEVELS;  Surgeon: Ophelia Charter;  Location: [facility];  Service: Neurosurgery;  Laterality: N/A;  Cervical three-four,Cervical four-five,Cervical Five-Six,Cervical Six-Seven ANTERIOR CERVICAL DECOMPRESSION WITH FUSION INTERBODY PROTHESIS PLATING AND BONEGRAFT  . APPENDECTOMY    . BIOPSY N/A 11/14/2014   Procedure: GASTRIC BIOPSY;  Surgeon: Daneil Dolin, MD;  Location: [facility];  Service: Endoscopy;  Laterality: N/A;  . CESAREAN SECTION    . CHOLECYSTECTOMY    . COLONOSCOPY WITH PROPOFOL N/A 11/14/2014   RMR: redundant colon but otherwise normal  . ESOPHAGOGASTRODUODENOSCOPY (EGD) WITH PROPOFOL N/A 11/14/2014   RMR"s/p gastric surgery/anastomtic ulcer  . ESOPHAGOGASTRODUODENOSCOPY (EGD) WITH PROPOFOL N/A 09/15/2017   Procedure: ESOPHAGOGASTRODUODENOSCOPY (EGD) WITH PROPOFOL;  Surgeon: Daneil Dolin, MD;  Location: [facility];  Service: Endoscopy;  Laterality: N/A;  11:30am  . ESOPHAGOGASTRODUODENOSCOPY (EGD) WITH PROPOFOL N/A 04/04/2019   2 cm ulcer crater at anastomosis with smaller satellite areas of ulceration in setting of NSAIDs/Goody powders, no bleeding stigmata.  . ESOPHAGOGASTRODUODENOSCOPY (EGD) WITH PROPOFOL N/A 06/13/2019   [place]: Prior  gastric bypass surgery with Billroth II type anatomy, otherwise normal-appearing residual upper GI tract.  [name] EYE SURGERY     bilateral cataracts removed, w/IOL  . GASTRIC BYPASS  2007  . JOINT REPLACEMENT     07/2010 &  2002- respectively- both knees   . MALONEY DILATION N/A 09/15/2017   Procedure: Venia Minks DILATION;  Surgeon: Daneil Dolin, MD;  Location: [facility];  Service: Endoscopy;  Laterality: N/A;  .  PARATHYROIDECTOMY    . PERIPHERALLY INSERTED CENTRAL CATHETER INSERTION     PICC line for treatment of MRSA  . TONSILLECTOMY     as a child    Current Outpatient Medications  Medication Sig Dispense Refill  . atenolol (TENORMIN) 50 MG tablet Take 1 tablet (50 mg total) by mouth daily. 90 tablet 3  . Cyanocobalamin (VITAMIN B-12 IJ) Inject 1 Applicatorful as directed every 30 (thirty) days.    [name] levothyroxine (SYNTHROID, LEVOTHROID) 75 MCG tablet Take 75 mcg by mouth daily before breakfast.    . lubiprostone (AMITIZA) 24 MCG capsule Take 1 capsule (24 mcg total) by mouth 2 (two) times daily with a meal. 60 capsule 3  . ondansetron (ZOFRAN) 4 MG tablet TAKE 1 TABLET BY MOUTH THREE TIMES DAILY AS NEEDED 30 tablet 5  . pantoprazole (PROTONIX) 40 MG tablet Take 40 mg by mouth 2 (two) times daily before a meal.    . venlafaxine (EFFEXOR) 75 MG tablet Take 75 mg by mouth 2 (two) times daily.     . Vitamin D, Ergocalciferol, (DRISDOL) 50000 units CAPS capsule Take 50,000 Units by mouth every 7 (seven) days. Fridays     No current facility-administered medications for this visit.   Allergies:  Benzodiazepines, Codeine, Escitalopram oxalate, Nsaids, Sertraline hcl, Statins, and Tramadol hcl   ROS: No syncope.  Physical Exam: VS:  BP 116/74   Pulse 100   Ht 5\' 5"  (1.651 m)   Wt 177 lb (80.3 kg)   SpO2 96%   BMI 29.45 kg/m , BMI Body mass index is 29.45 kg/m.  Wt Readings from Last 3 Encounters:  04/15/20 177 lb (80.3 kg)  03/31/20 186 lb 4.6 oz (84.5 kg)  03/04/20 186 lb 3.2 oz (84.5 kg)    General: Patient appears comfortable at rest. HEENT: Conjunctiva and lids normal, wearing a mask. Neck: Supple, no elevated JVP or carotid bruits, no thyromegaly. Lungs: Clear to auscultation, nonlabored breathing at rest. Cardiac: Irregularly irregular, no S3, soft systolic murmur, no pericardial rub. Extremities: No pitting edema.  ECG:  An ECG dated 07/13/2019 was personally reviewed today and  demonstrated:  Atrial fibrillation at 108 bpm, nonspecific ST-T changes.  Recent Labwork: 06/09/2019: ALT 9; AST 16; TSH 4.005 06/12/2019: Magnesium 2.0 06/14/2019: BUN 13; Creatinine, Ser 1.24; Hemoglobin 8.3; Platelets 261; Potassium 4.3; Sodium 139   Other Studies Reviewed Today:  Echocardiogram 06/10/2019: 1. Left ventricular ejection fraction, by estimation, is 55 to 60%. The  left ventricle has normal function. The left ventricle has no regional  wall motion abnormalities. There is mild concentric left ventricular  hypertrophy. Left ventricular diastolic  parameters are indeterminate.  2. Right ventricular systolic function is normal. The right ventricular  size is normal. There is normal pulmonary artery systolic pressure.  3. Left atrial size was severely dilated.  4. Right atrial size was mildly dilated.  5. The mitral valve is degenerative. Mild mitral valve regurgitation.  6. The aortic valve is tricuspid. Aortic valve regurgitation is not  visualized. No aortic stenosis is  present.  7. The inferior vena cava is dilated in size with >50% respiratory  variability, suggesting right atrial pressure of 8 mmHg.   Assessment and Plan:  1.  Persistent atrial fibrillation with CHA2DS2-VASc score of 3-4.  She was previously on Eliquis, this has been held (initially pending prior back surgery which was never pursued) and more recently pending further GI work-up of iron deficiency anemia.  Heart rate control is not adequate.  Patient has actually not been on Cardizem CD 240 mg daily which was inadvertently still on her medication list (she states that she has been off of it for months).  We clarified this today.  Plan to increase atenolol to 50 mg daily, she has been tolerating this so far.  Can reconsider potential anticoagulation pending completion of GI work-up with colonoscopy.  If she is not a good candidate for long-term anticoagulation, referral for [facility] evaluation can be  considered.  2.  Iron deficiency anemia, work-up ongoing per gastroenterology and PCP.  Requesting lab work from Dr. Karie Kirks.  Medication Adjustments/Labs and Tests Ordered: Current medicines are reviewed at length with the patient today.  Concerns regarding medicines are outlined above.   Tests Ordered: Orders Placed This Encounter  Procedures  . EKG 12-Lead    Medication Changes: Meds ordered this encounter  Medications  . atenolol (TENORMIN) 50 MG tablet    Sig: Take 1 tablet (50 mg total) by mouth daily.    Dispense:  90 tablet    Refill:  3    04/15/20 dose increased to 50 mg qd    Disposition:  Follow up 6 weeks in the [place] office.  Signed, Satira Sark, MD, [facility] 04/15/2020 2:31 PM    [facility] at [facility] [address] [place],  [address] Phone: [phone]; Fax: [phone]

## 2020-04-15 ENCOUNTER — Encounter: Payer: Self-pay | Admitting: Cardiology

## 2020-04-15 ENCOUNTER — Ambulatory Visit (INDEPENDENT_AMBULATORY_CARE_PROVIDER_SITE_OTHER): Payer: PPO | Admitting: Cardiology

## 2020-04-15 ENCOUNTER — Other Ambulatory Visit: Payer: Self-pay

## 2020-04-15 VITALS — BP 116/74 | HR 100 | Ht 65.0 in | Wt 177.0 lb

## 2020-04-15 DIAGNOSIS — I4819 Other persistent atrial fibrillation: Secondary | ICD-10-CM | POA: Diagnosis not present

## 2020-04-15 DIAGNOSIS — D509 Iron deficiency anemia, unspecified: Secondary | ICD-10-CM

## 2020-04-15 MED ORDER — ATENOLOL 50 MG PO TABS: 50.0000 mg | ORAL_TABLET | Freq: Every day | ORAL | 3 refills | Status: DC

## 2020-04-15 NOTE — Patient Instructions (Signed)
Medication Instructions:  INCREASE Atenolol to 50 mg daily   *If you need a refill on your cardiac medications before your next appointment, please call your pharmacy*   Lab Work: None today If you have labs (blood work) drawn today and your tests are completely normal, you will receive your results only by: [name] MyChart Message (if you have MyChart) OR . A paper copy in the mail If you have any lab test that is abnormal or we need to change your treatment, we will call you to review the results.   Testing/Procedures: None today   Follow-Up: At [facility], you and your health needs are our priority.  As part of our continuing mission to provide you with exceptional heart care, we have created designated Provider Care Teams.  These Care Teams include your primary Cardiologist (physician) and Advanced Practice Providers (APPs -  Physician Assistants and Nurse Practitioners) who all work together to provide you with the care you need, when you need it.  We recommend signing up for the patient portal called "MyChart".  Sign up information is provided on this After Visit Summary.  MyChart is used to connect with patients for Virtual Visits (Telemedicine).  Patients are able to view lab/test results, encounter notes, upcoming appointments, etc.  Non-urgent messages can be sent to your provider as well.   To learn more about what you can do with MyChart, go to [email or web address].    Your next appointment:   6 week(s)  The format for your next appointment:   In Person  Provider:   Rozann Lesches, MD or Bernerd Pho, PA-C   Other Instructions None      Thank you for choosing [facility] !

## 2020-05-05 NOTE — Patient Instructions (Signed)
LAELYN BLUMENTHAL  05/05/2020     @PREFPERIOPPHARMACY @   Your procedure is scheduled on  05/12/2020   Report to [facility] at  1230  P.M.   Call this number if you have problems the morning of surgery:  [phone]   Remember:  Follow the diet and prep instructions given to you by the office.                       Take these medicines the morning of surgery with A SIP OF WATER  Atenolol, levothyroxine, zofran (if needed), protonix, effexor.    Please brush your teeth.  Do not wear jewelry, make-up or nail polish.  Do not wear lotions, powders, or perfumes, or deodorant.  Do not shave 48 hours prior to surgery.  Men may shave face and neck.  Do not bring valuables to the hospital.  [facility] is not responsible for any belongings or valuables.   Contacts, dentures or bridgework may not be worn into surgery.  Leave your suitcase in the car.  After surgery it may be brought to your room.  For patients admitted to the hospital, discharge time will be determined by your treatment team.  Patients discharged the day of surgery will not be allowed to drive home and must have someone with them for 24 hours.     Special instructions:  DO NOT smoke tobacco or vape for 24 hours before your procedure.  Please read over the following fact sheets that you were given. Anesthesia Post-op Instructions and Care and Recovery After Surgery       Colonoscopy, Adult, Care After This sheet gives you information about how to care for yourself after your procedure. Your health care provider may also give you more specific instructions. If you have problems or questions, contact your health care provider. What can I expect after the procedure? After the procedure, it is common to have:  A small amount of blood in your stool for 24 hours after the procedure.  Some gas.  Mild cramping or bloating of your abdomen. Follow these instructions at home: Eating and drinking  Drink enough  fluid to keep your urine pale yellow.  Follow instructions from your health care provider about eating or drinking restrictions.  Resume your normal diet as instructed by your health care provider. Avoid heavy or fried foods that are hard to digest.   Activity  Rest as told by your health care provider.  Avoid sitting for a long time without moving. Get up to take short walks every 1-2 hours. This is important to improve blood flow and breathing. Ask for help if you feel weak or unsteady.  Return to your normal activities as told by your health care provider. Ask your health care provider what activities are safe for you. Managing cramping and bloating  Try walking around when you have cramps or feel bloated.  Apply heat to your abdomen as told by your health care provider. Use the heat source that your health care provider recommends, such as a moist heat pack or a heating pad. ? Place a towel between your skin and the heat source. ? Leave the heat on for 20-30 minutes. ? Remove the heat if your skin turns bright red. This is especially important if you are unable to feel pain, heat, or cold. You may have a greater risk of getting burned.   General instructions  If you were given  a sedative during the procedure, it can affect you for several hours. Do not drive or operate machinery until your health care provider says that it is safe.  For the first 24 hours after the procedure: ? Do not sign important documents. ? Do not drink alcohol. ? Do your regular daily activities at a slower pace than normal. ? Eat soft foods that are easy to digest.  Take over-the-counter and prescription medicines only as told by your health care provider.  Keep all follow-up visits as told by your health care provider. This is important. Contact a health care provider if:  You have blood in your stool 2-3 days after the procedure. Get help right away if you have:  More than a small spotting of blood  in your stool.  Large blood clots in your stool.  Swelling of your abdomen.  Nausea or vomiting.  A fever.  Increasing pain in your abdomen that is not relieved with medicine. Summary  After the procedure, it is common to have a small amount of blood in your stool. You may also have mild cramping and bloating of your abdomen.  If you were given a sedative during the procedure, it can affect you for several hours. Do not drive or operate machinery until your health care provider says that it is safe.  Get help right away if you have a lot of blood in your stool, nausea or vomiting, a fever, or increased pain in your abdomen. This information is not intended to replace advice given to you by your health care provider. Make sure you discuss any questions you have with your health care provider. Document Revised: 12/29/2018 Document Reviewed: 07/31/2018 Elsevier Patient Education  2021 [place] After This sheet gives you information about how to care for yourself after your procedure. Your health care provider may also give you more specific instructions. If you have problems or questions, contact your health care provider. What can I expect after the procedure? After the procedure, it is common to have:  Tiredness.  Forgetfulness about what happened after the procedure.  Impaired judgment for important decisions.  Nausea or vomiting.  Some difficulty with balance. Follow these instructions at home: For the time period you were told by your health care provider:  Rest as needed.  Do not participate in activities where you could fall or become injured.  Do not drive or use machinery.  Do not drink alcohol.  Do not take sleeping pills or medicines that cause drowsiness.  Do not make important decisions or sign legal documents.  Do not take care of children on your own.      Eating and drinking  Follow the diet that is recommended  by your health care provider.  Drink enough fluid to keep your urine pale yellow.  If you vomit: ? Drink water, juice, or soup when you can drink without vomiting. ? Make sure you have little or no nausea before eating solid foods. General instructions  Have a responsible adult stay with you for the time you are told. It is important to have someone help care for you until you are awake and alert.  Take over-the-counter and prescription medicines only as told by your health care provider.  If you have sleep apnea, surgery and certain medicines can increase your risk for breathing problems. Follow instructions from your health care provider about wearing your sleep device: ? Anytime you are sleeping, including during daytime naps. ? While  taking prescription pain medicines, sleeping medicines, or medicines that make you drowsy.  Avoid smoking.  Keep all follow-up visits as told by your health care provider. This is important. Contact a health care provider if:  You keep feeling nauseous or you keep vomiting.  You feel light-headed.  You are still sleepy or having trouble with balance after 24 hours.  You develop a rash.  You have a fever.  You have redness or swelling around the IV site. Get help right away if:  You have trouble breathing.  You have new-onset confusion at home. Summary  For several hours after your procedure, you may feel tired. You may also be forgetful and have poor judgment.  Have a responsible adult stay with you for the time you are told. It is important to have someone help care for you until you are awake and alert.  Rest as told. Do not drive or operate machinery. Do not drink alcohol or take sleeping pills.  Get help right away if you have trouble breathing, or if you suddenly become confused. This information is not intended to replace advice given to you by your health care provider. Make sure you discuss any questions you have with your  health care provider. Document Revised: 09/20/2019 Document Reviewed: 12/07/2018 Elsevier Patient Education  2021 [employer].

## 2020-05-08 ENCOUNTER — Telehealth: Payer: Self-pay | Admitting: Internal Medicine

## 2020-05-08 ENCOUNTER — Other Ambulatory Visit: Payer: Self-pay

## 2020-05-08 ENCOUNTER — Other Ambulatory Visit (HOSPITAL_COMMUNITY)
Admission: RE | Admit: 2020-05-08 | Discharge: 2020-05-08 | Disposition: A | Discharge status: Home / Self Care | Payer: PPO | Source: Ambulatory Visit | Attending: Internal Medicine | Admitting: Internal Medicine

## 2020-05-08 ENCOUNTER — Encounter (HOSPITAL_COMMUNITY): Payer: Self-pay

## 2020-05-08 ENCOUNTER — Encounter (HOSPITAL_COMMUNITY)
Admission: RE | Admit: 2020-05-08 | Discharge: 2020-05-08 | Disposition: A | Discharge status: Home / Self Care | Payer: PPO | Source: Ambulatory Visit | Attending: Internal Medicine | Admitting: Internal Medicine

## 2020-05-08 DIAGNOSIS — Z01812 Encounter for preprocedural laboratory examination: Secondary | ICD-10-CM | POA: Insufficient documentation

## 2020-05-08 DIAGNOSIS — Z20822 Contact with and (suspected) exposure to covid-19: Secondary | ICD-10-CM | POA: Diagnosis not present

## 2020-05-08 LAB — CBC
HCT: 43.2 % (ref 36.0–46.0)
Hemoglobin: 14 g/dL (ref 12.0–15.0)
MCH: 33.8 pg (ref 26.0–34.0)
MCHC: 32.4 g/dL (ref 30.0–36.0)
MCV: 104.3 fL — ABNORMAL HIGH (ref 80.0–100.0)
Platelets: 164 10*3/uL (ref 150–400)
RBC: 4.14 MIL/uL (ref 3.87–5.11)
RDW: 15.9 % — ABNORMAL HIGH (ref 11.5–15.5)
WBC: 6.3 10*3/uL (ref 4.0–10.5)
nRBC: 0 % (ref 0.0–0.2)

## 2020-05-08 LAB — IRON AND TIBC
Iron: 92 ug/dL (ref 28–170)
Saturation Ratios: 32 % — ABNORMAL HIGH (ref 10.4–31.8)
TIBC: 288 ug/dL (ref 250–450)
UIBC: 196 ug/dL

## 2020-05-08 LAB — FERRITIN: Ferritin: 138 ng/mL (ref 11–307)

## 2020-05-08 LAB — SARS CORONAVIRUS 2 (TAT 6-24 HRS): SARS Coronavirus 2: NEGATIVE

## 2020-05-08 NOTE — Telephone Encounter (Signed)
Please send prep to [facility] on [address], [place]. She is scheduled for 05/12/2020

## 2020-05-08 NOTE — Telephone Encounter (Signed)
Called [facility] and was advised they had Rx on file and patient just needed to call when she wanted it filled. They will get it ready for her.  Called pt and LMOVM making aware [facility] will get Rx ready

## 2020-05-12 ENCOUNTER — Ambulatory Visit (HOSPITAL_COMMUNITY): Payer: PPO | Admitting: Anesthesiology

## 2020-05-12 ENCOUNTER — Other Ambulatory Visit: Payer: Self-pay | Admitting: Internal Medicine

## 2020-05-12 ENCOUNTER — Ambulatory Visit (HOSPITAL_COMMUNITY)
Admission: RE | Admit: 2020-05-12 | Discharge: 2020-05-12 | Disposition: A | Discharge status: Home / Self Care | Payer: PPO | Attending: Internal Medicine | Admitting: Internal Medicine

## 2020-05-12 ENCOUNTER — Encounter (HOSPITAL_COMMUNITY)
Admission: RE | Disposition: A | Discharge status: Home / Self Care | Payer: Self-pay | Source: Home / Self Care | Attending: Internal Medicine

## 2020-05-12 ENCOUNTER — Encounter (HOSPITAL_COMMUNITY): Payer: Self-pay | Admitting: Internal Medicine

## 2020-05-12 DIAGNOSIS — Z79899 Other long term (current) drug therapy: Secondary | ICD-10-CM | POA: Diagnosis not present

## 2020-05-12 DIAGNOSIS — G8929 Other chronic pain: Secondary | ICD-10-CM | POA: Insufficient documentation

## 2020-05-12 DIAGNOSIS — Z96653 Presence of artificial knee joint, bilateral: Secondary | ICD-10-CM | POA: Insufficient documentation

## 2020-05-12 DIAGNOSIS — Z87891 Personal history of nicotine dependence: Secondary | ICD-10-CM | POA: Insufficient documentation

## 2020-05-12 DIAGNOSIS — Z79890 Hormone replacement therapy (postmenopausal): Secondary | ICD-10-CM | POA: Diagnosis not present

## 2020-05-12 DIAGNOSIS — Z9884 Bariatric surgery status: Secondary | ICD-10-CM | POA: Insufficient documentation

## 2020-05-12 DIAGNOSIS — K635 Polyp of colon: Secondary | ICD-10-CM

## 2020-05-12 DIAGNOSIS — D509 Iron deficiency anemia, unspecified: Secondary | ICD-10-CM | POA: Insufficient documentation

## 2020-05-12 DIAGNOSIS — K439 Ventral hernia without obstruction or gangrene: Secondary | ICD-10-CM | POA: Diagnosis not present

## 2020-05-12 DIAGNOSIS — D120 Benign neoplasm of cecum: Secondary | ICD-10-CM | POA: Diagnosis not present

## 2020-05-12 DIAGNOSIS — D124 Benign neoplasm of descending colon: Secondary | ICD-10-CM | POA: Insufficient documentation

## 2020-05-12 DIAGNOSIS — R109 Unspecified abdominal pain: Secondary | ICD-10-CM | POA: Insufficient documentation

## 2020-05-12 DIAGNOSIS — K5730 Diverticulosis of large intestine without perforation or abscess without bleeding: Secondary | ICD-10-CM | POA: Diagnosis not present

## 2020-05-12 DIAGNOSIS — I4891 Unspecified atrial fibrillation: Secondary | ICD-10-CM | POA: Diagnosis not present

## 2020-05-12 HISTORY — PX: COLONOSCOPY WITH PROPOFOL: SHX5780

## 2020-05-12 HISTORY — PX: POLYPECTOMY: SHX5525

## 2020-05-12 SURGERY — COLONOSCOPY WITH PROPOFOL
Anesthesia: General

## 2020-05-12 MED ORDER — PROPOFOL 10 MG/ML IV BOLUS
INTRAVENOUS | Status: DC | PRN
  Administered 2020-05-12: 20 mg via INTRAVENOUS
  Administered 2020-05-12: 70 mg via INTRAVENOUS

## 2020-05-12 MED ORDER — LIDOCAINE HCL (CARDIAC) PF 100 MG/5ML IV SOSY
PREFILLED_SYRINGE | INTRAVENOUS | Status: DC | PRN
  Administered 2020-05-12: 50 mg via INTRAVENOUS

## 2020-05-12 MED ORDER — PROPOFOL 500 MG/50ML IV EMUL
INTRAVENOUS | Status: DC | PRN
  Administered 2020-05-12: 125 ug/kg/min via INTRAVENOUS

## 2020-05-12 MED ORDER — LACTATED RINGERS IV SOLN: INTRAVENOUS | Status: DC

## 2020-05-12 NOTE — Discharge Instructions (Addendum)
Colonoscopy Discharge Instructions  Read the instructions outlined below and refer to this sheet in the next few weeks. These discharge instructions provide you with general information on caring for yourself after you leave the hospital. Your doctor may also give you specific instructions. While your treatment has been planned according to the most current medical practices available, unavoidable complications occasionally occur. If you have any problems or questions after discharge, call Dr. Gala [name] at [phone]. ACTIVITY  You may resume your regular activity, but move at a slower pace for the next 24 hours.   Take frequent rest periods for the next 24 hours.   Walking will help get rid of the air and reduce the bloated feeling in your belly (abdomen).   No driving for 24 hours (because of the medicine (anesthesia) used during the test).    Do not sign any important legal documents or operate any machinery for 24 hours (because of the anesthesia used during the test).  NUTRITION  Drink plenty of fluids.   You may resume your normal diet as instructed by your doctor.   Begin with a light meal and progress to your normal diet. Heavy or fried foods are harder to digest and may make you feel sick to your stomach (nauseated).   Avoid alcoholic beverages for 24 hours or as instructed.  MEDICATIONS  You may resume your normal medications unless your doctor tells you otherwise.  WHAT YOU CAN EXPECT TODAY  Some feelings of bloating in the abdomen.   Passage of more gas than usual.   Spotting of blood in your stool or on the toilet paper.  IF YOU HAD POLYPS REMOVED DURING THE COLONOSCOPY:  No aspirin products for 7 days or as instructed.   No alcohol for 7 days or as instructed.   Eat a soft diet for the next 24 hours.  FINDING OUT THE RESULTS OF YOUR TEST Not all test results are available during your visit. If your test results are not back during the visit, make an appointment  with your caregiver to find out the results. Do not assume everything is normal if you have not heard from your caregiver or the medical facility. It is important for you to follow up on all of your test results.  SEEK IMMEDIATE MEDICAL ATTENTION IF:  You have more than a spotting of blood in your stool.   Your belly is swollen (abdominal distention).   You are nauseated or vomiting.   You have a temperature over 101.   You have abdominal pain or discomfort that is severe or gets worse throughout the day.    Polyp and diverticulosis information provided  6 polyps removed in your colon today -does not explain your abdominal pain  Further recommendations to follow pending review of pathology report  See bariatric surgeon regarding repair of right sided abdominal wall hernia.  At patient request, I called Glendale Chard at [phone] -reviewed results and recommendations   Diverticulosis  Diverticulosis is a condition that develops when small pouches (diverticula) form in the wall of the large intestine (colon). The colon is where water is absorbed and stool (feces) is formed. The pouches form when the inside layer of the colon pushes through weak spots in the outer layers of the colon. You may have a few pouches or many of them. The pouches usually do not cause problems unless they become inflamed or infected. When this happens, the condition is called diverticulitis. What are the causes? The cause of this  condition is not known. What increases the risk? The following factors may make you more likely to develop this condition:  Being older than age 66. Your risk for this condition increases with age. Diverticulosis is rare among people younger than age 62. By age 49, many people have it.  Eating a low-fiber diet.  Having frequent constipation.  Being overweight.  Not getting enough exercise.  Smoking.  Taking over-the-counter pain medicines, like aspirin and  ibuprofen.  Having a family history of diverticulosis. What are the signs or symptoms? In most people, there are no symptoms of this condition. If you do have symptoms, they may include:  Bloating.  Cramps in the abdomen.  Constipation or diarrhea.  Pain in the lower left side of the abdomen. How is this diagnosed? Because diverticulosis usually has no symptoms, it is most often diagnosed during an exam for other colon problems. The condition may be diagnosed by:  Using a flexible scope to examine the colon (colonoscopy).  Taking an X-ray of the colon after dye has been put into the colon (barium enema).  Having a CT scan. How is this treated? You may not need treatment for this condition. Your health care provider may recommend treatment to prevent problems. You may need treatment if you have symptoms or if you previously had diverticulitis. Treatment may include:  Eating a high-fiber diet.  Taking a fiber supplement.  Taking a live bacteria supplement (probiotic).  Taking medicine to relax your colon.   Follow these instructions at home: Medicines  Take over-the-counter and prescription medicines only as told by your health care provider.  If told by your health care provider, take a fiber supplement or probiotic. Constipation prevention Your condition may cause constipation. To prevent or treat constipation, you may need to:  Drink enough fluid to keep your urine pale yellow.  Take over-the-counter or prescription medicines.  Eat foods that are high in fiber, such as beans, whole grains, and fresh fruits and vegetables.  Limit foods that are high in fat and processed sugars, such as fried or sweet foods.   General instructions  Try not to strain when you have a bowel movement.  Keep all follow-up visits as told by your health care provider. This is important. Contact a health care provider if you:  Have pain in your abdomen.  Have bloating.  Have  cramps.  Have not had a bowel movement in 3 days. Get help right away if:  Your pain gets worse.  Your bloating becomes very bad.  You have a fever or chills, and your symptoms suddenly get worse.  You vomit.  You have bowel movements that are bloody or black.  You have bleeding from your rectum. Summary  Diverticulosis is a condition that develops when small pouches (diverticula) form in the wall of the large intestine (colon).  You may have a few pouches or many of them.  This condition is most often diagnosed during an exam for other colon problems.  Treatment may include increasing the fiber in your diet, taking supplements, or taking medicines. This information is not intended to replace advice given to you by your health care provider. Make sure you discuss any questions you have with your health care provider. Document Revised: 08/03/2018 Document Reviewed: 08/03/2018 Elsevier Patient Education  [place].

## 2020-05-12 NOTE — Anesthesia Postprocedure Evaluation (Addendum)
Anesthesia Post Note  Patient: [name]  Procedure(s) Performed: COLONOSCOPY WITH PROPOFOL (N/A ) POLYPECTOMY  Patient location during evaluation: Phase II Anesthesia Type: General Level of consciousness: awake and alert and oriented Pain management: pain level controlled Vital Signs Assessment: post-procedure vital signs reviewed and stable Respiratory status: spontaneous breathing and respiratory function stable Cardiovascular status: blood pressure returned to baseline and stable Postop Assessment: no apparent nausea or vomiting Anesthetic complications: no   No complications documented.   Last Vitals:  Vitals:   05/12/20 1359 05/12/20 1524  BP: (!) 143/95 108/62  Pulse:  (!) 108  Resp:  18  Temp:  36.5 C  SpO2:  97%    Last Pain:  Vitals:   05/12/20 1524  TempSrc: Oral  PainSc: 3                  Shalynn Jorstad C Shanyah Gattuso

## 2020-05-12 NOTE — Transfer of Care (Signed)
Immediate Anesthesia Transfer of Care Note  Patient: [name]  Procedure(s) Performed: COLONOSCOPY WITH PROPOFOL (N/A ) POLYPECTOMY  Patient Location: PACU  Anesthesia Type:General  Level of Consciousness: awake, alert  and oriented  Airway & Oxygen Therapy: Patient Spontanous Breathing  Post-op Assessment: Report given to RN and Post -op Vital signs reviewed and stable  Post vital signs: Reviewed and stable  Last Vitals:  Vitals Value Taken Time  BP    Temp    Pulse    Resp    SpO2      Last Pain:  Vitals:   05/12/20 1358  TempSrc: Oral  PainSc: 6          Complications: No complications documented.

## 2020-05-12 NOTE — H&P (Signed)
@LOGO @   Primary Care Physician:  Lemmie Evens, MD Primary Gastroenterologist:  Dr. Gala Romney  Pre-Procedure History & Physical: HPI:  [name] is a 75 y.o. female here for further evaluation of: Deficiency anemia via colonoscopy.  Right-sided abdominal pain may well be secondary to Known spigelian hernia.  Past Medical History:  Diagnosis Date  . Anemia   . Anxiety   . Arthritis   . Atrial fibrillation ([place])   . Chronic back pain    Spondylosis and stenosis  . Depression   . GERD (gastroesophageal reflux disease)   . Headache(784.0)   . Hiatal hernia   . History of blood transfusion   . History of GI bleed   . Hypothyroidism   . Nephrolithiasis   . Obsessive-compulsive disorder   . Recurrent upper respiratory infection (URI)   . Sleep apnea     Past Surgical History:  Procedure Laterality Date  . ABDOMINAL HYSTERECTOMY    . ANTERIOR CERVICAL DECOMP/DISCECTOMY FUSION  12/09/2010   Procedure: ANTERIOR CERVICAL DECOMPRESSION/DISCECTOMY FUSION 3 LEVELS;  Surgeon: Ophelia Charter;  Location: [facility];  Service: Neurosurgery;  Laterality: N/A;  Cervical three-four,Cervical four-five,Cervical Five-Six,Cervical Six-Seven ANTERIOR CERVICAL DECOMPRESSION WITH FUSION INTERBODY PROTHESIS PLATING AND BONEGRAFT  . APPENDECTOMY    . BIOPSY N/A 11/14/2014   Procedure: GASTRIC BIOPSY;  Surgeon: Daneil Dolin, MD;  Location: [facility];  Service: Endoscopy;  Laterality: N/A;  . CESAREAN SECTION    . CHOLECYSTECTOMY    . COLONOSCOPY WITH PROPOFOL N/A 11/14/2014   RMR: redundant colon but otherwise normal  . ESOPHAGOGASTRODUODENOSCOPY (EGD) WITH PROPOFOL N/A 11/14/2014   RMR"s/p gastric surgery/anastomtic ulcer  . ESOPHAGOGASTRODUODENOSCOPY (EGD) WITH PROPOFOL N/A 09/15/2017   Procedure: ESOPHAGOGASTRODUODENOSCOPY (EGD) WITH PROPOFOL;  Surgeon: Daneil Dolin, MD;  Location: [facility];  Service: Endoscopy;  Laterality: N/A;  11:30am  . ESOPHAGOGASTRODUODENOSCOPY (EGD) WITH  PROPOFOL N/A 04/04/2019   2 cm ulcer crater at anastomosis with smaller satellite areas of ulceration in setting of NSAIDs/Goody powders, no bleeding stigmata.  . ESOPHAGOGASTRODUODENOSCOPY (EGD) WITH PROPOFOL N/A 06/13/2019   [name]: Prior gastric bypass surgery with Billroth II type anatomy, otherwise normal-appearing residual upper GI tract.  [name] EYE SURGERY     bilateral cataracts removed, w/IOL  . GASTRIC BYPASS  2008  . JOINT REPLACEMENT     07/2010 &  2002- respectively- both knees   . MALONEY DILATION N/A 09/15/2017   Procedure: Venia Minks DILATION;  Surgeon: Daneil Dolin, MD;  Location: [facility];  Service: Endoscopy;  Laterality: N/A;  . PARATHYROIDECTOMY    . PERIPHERALLY INSERTED CENTRAL CATHETER INSERTION     PICC line for treatment of MRSA  . TONSILLECTOMY     as a child    Prior to Admission medications   Medication Sig Start Date End Date Taking? Authorizing Provider  atenolol (TENORMIN) 50 MG tablet Take 1 tablet (50 mg total) by mouth daily. 04/15/20 07/14/20 Yes Satira Sark, MD  Cyanocobalamin (VITAMIN B-12 IJ) Inject 0.1 mLs as directed every 30 (thirty) days.   Yes [provider]  levothyroxine (SYNTHROID, LEVOTHROID) 75 MCG tablet Take 75 mcg by mouth daily before breakfast.   Yes [provider]  lubiprostone (AMITIZA) 24 MCG capsule Take 1 capsule (24 mcg total) by mouth 2 (two) times daily with a meal. 08/14/19  Yes Mahala Menghini, PA-C  ondansetron (ZOFRAN) 4 MG tablet TAKE 1 TABLET BY MOUTH THREE TIMES DAILY AS NEEDED Patient taking differently: Take 4 mg by  mouth 3 (three) times daily as needed for nausea or vomiting. 11/06/19  Yes Annitta Needs, NP  pantoprazole (PROTONIX) 40 MG tablet Take 40 mg by mouth 2 (two) times daily before a meal.   Yes [provider]  venlafaxine (EFFEXOR) 75 MG tablet Take 75 mg by mouth 2 (two) times daily.    Yes [provider]  Vitamin D, Ergocalciferol, (DRISDOL) 50000 units CAPS capsule  Take 50,000 Units by mouth every 7 (seven) days.   Yes [provider]    Allergies as of 04/01/2020 - Review Complete 03/31/2020  Allergen Reaction Noted  . Benzodiazepines Other (See Comments) 12/03/2011  . Codeine Nausea Only   . Escitalopram oxalate Nausea Only 12/02/2010  . Nsaids Other (See Comments) 12/22/2012  . Sertraline hcl Nausea Only   . Statins Nausea And Vomiting   . Tramadol hcl Other (See Comments) 12/22/2012    Family History  Problem Relation Age of Onset  . Anxiety disorder Mother   . Breast cancer Mother   . Anxiety disorder Sister   . Bipolar disorder Daughter   . OCD Daughter   . ADD / ADHD Daughter   . Heart attack Father   . Breast cancer Sister   . Anesthesia problems Neg Hx   . Hypotension Neg Hx   . Malignant hyperthermia Neg Hx   . Pseudochol deficiency Neg Hx   . Dementia Neg Hx   . Alcohol abuse Neg Hx   . Drug abuse Neg Hx   . Depression Neg Hx   . Paranoid behavior Neg Hx   . Schizophrenia Neg Hx   . Seizures Neg Hx   . Sexual abuse Neg Hx   . Physical abuse Neg Hx   . Colon cancer Neg Hx     Social History   Socioeconomic History  . Marital status: Widowed    Spouse name: Not on file  . Number of children: 4  . Years of education: Not on file  . Highest education level: Not on file  Occupational History  . Not on file  Tobacco Use  . Smoking status: Former Smoker    Packs/day: 1.00    Years: 30.00    Pack years: 30.00    Types: Cigarettes    Quit date: 01/18/1998    Years since quitting: 22.3  . Smokeless tobacco: Never Used  Vaping Use  . Vaping Use: Never used  Substance and Sexual Activity  . Alcohol use: No  . Drug use: No  . Sexual activity: Not on file  Other Topics Concern  . Not on file  Social History Narrative  . Not on file   Social Determinants of Health   Financial Resource Strain: Not on file  Food Insecurity: Not on file  Transportation Needs: Not on file  Physical Activity: Not on file   Stress: Not on file  Social Connections: Not on file  Intimate Partner Violence: Not on file    Review of Systems: See HPI, otherwise negative ROS  Physical Exam: There were no vitals taken for this visit. General:   Alert,  Well-developed, well-nourished, pleasant and cooperative in NAD Neck:  Supple; no masses or thyromegaly. No significant cervical adenopathy. Lungs:  Clear throughout to auscultation.   No wheezes, crackles, or rhonchi. No acute distress. Heart:  Regular rate and rhythm; no murmurs, clicks, rubs,  or gallops. Abdomen: Non-distended, normal bowel sounds.  Soft and nontender without appreciable mass or hepatosplenomegaly.  Pulses:  Normal pulses noted.  Extremities:  Without clubbing or edema.  Impression/Plan: 75 year old lady with iron deficiency anemia (iron parameters improved with iron infusion) here for colonoscopy.  Chronic right side abdominal pain likely due to her known spigelian hernia  Recommendations: I have offered her a diagnostic colonoscopy today per plan.  The risks, benefits, limitations, alternatives and imponderables have been reviewed with the patient. Questions have been answered. All parties are agreeable.   The risks, benefits, limitations, alternatives and imponderables have been reviewed with the patient. Questions have been answered. All parties are agreeable.     Notice: This dictation was prepared with Dragon dictation along with smaller phrase technology. Any transcriptional errors that result from this process are unintentional and may not be corrected upon review.

## 2020-05-12 NOTE — Op Note (Signed)
[facility] Patient Name: [name] Procedure Date: 05/12/2020 12:33 PM MRN: [id number] Date of Birth: 08-13-1945 Attending MD: Norvel Richards , MD CSN: [id number] Age: 75 Admit Type: Outpatient Procedure:                Colonoscopy Indications:              Iron deficiency anemia Providers:                Norvel Richards, MD, Janeece Riggers, RN, Raphael Gibney, Technician Referring MD:              Medicines:                Propofol per Anesthesia Complications:            No immediate complications. Estimated Blood Loss:     Estimated blood loss was minimal. Procedure:                Pre-Anesthesia Assessment:                           - Prior to the procedure, a History and Physical                            was performed, and patient medications and                            allergies were reviewed. The patient's tolerance of                            previous anesthesia was also reviewed. The risks                            and benefits of the procedure and the sedation                            options and risks were discussed with the patient.                            All questions were answered, and informed consent                            was obtained. Prior Anticoagulants: The patient has                            taken no previous anticoagulant or antiplatelet                            agents. ASA Grade Assessment: III - A patient with                            severe systemic disease. After reviewing the risks  and benefits, the patient was deemed in                            satisfactory condition to undergo the procedure.                           After obtaining informed consent, the colonoscope                            was passed under direct vision. Throughout the                            procedure, the patient's blood pressure, pulse, and                            oxygen saturations  were monitored continuously. The                            CF-[id number] ([id number]) scope was introduced through                            the anus and advanced to the the cecum, identified                            by appendiceal orifice and ileocecal valve. The                            colonoscopy was performed without difficulty. The                            patient tolerated the procedure well. The quality                            of the bowel preparation was adequate. Scope In: 2:34:46 PM Scope Out: 3:12:16 PM Scope Withdrawal Time: 0 hours 18 minutes 5 seconds  Total Procedure Duration: 0 hours 37 minutes 30 seconds  Findings:      The perianal and digital rectal examinations were normal.      Seven semi-pedunculated polyps were found in the recto-sigmoid colon,       descending colon and cecum. The polyps were 4 to 7 mm in size. These       polyps were removed with a cold snare. Resection and retrieval were       complete. Estimated blood loss was minimal.      Scattered medium-mouthed diverticula were found in the sigmoid colon.      The exam was otherwise without abnormality on direct and retroflexion       views. Impression:               - Seven 4 to 7 mm polyps at the recto-sigmoid                            colon, in the descending colon and in the cecum,  removed with a cold snare. Resected and retrieved.                           - Diverticulosis in the sigmoid colon.                           - The examination was otherwise normal on direct                            and retroflexion views. Moderate Sedation:      Moderate (conscious) sedation was personally administered by an       anesthesia professional. The following parameters were monitored: oxygen       saturation, heart rate, blood pressure, respiratory rate, EKG, adequacy       of pulmonary ventilation, and response to care. Recommendation:           - Patient has a contact number  available for                            emergencies. The signs and symptoms of potential                            delayed complications were discussed with the                            patient. Return to normal activities tomorrow.                            Written discharge instructions were provided to the                            patient.                           - Resume previous diet.                           - Continue present medications.                           - Repeat colonoscopy date to be determined after                            pending pathology results are reviewed for                            surveillance.                           - Return to GI office (date not yet determined). I                            recommend patient see her bariatric surgeon for                            consideration of repair of a  right sided spigelian                            hernia seen on CT which likely explains her                            right-sided abdominal pain. Procedure Code(s):        --- Professional ---                           [phone], Colonoscopy, flexible; with removal of                            tumor(s), polyp(s), or other lesion(s) by snare                            technique Diagnosis Code(s):        --- Professional ---                           K63.5, Polyp of colon                           D50.9, Iron deficiency anemia, unspecified                           K57.30, Diverticulosis of large intestine without                            perforation or abscess without bleeding CPT copyright 2019 [employer]. All rights reserved. The codes documented in this report are preliminary and upon coder review may  be revised to meet current compliance requirements. [name], MD Norvel Richards, MD 05/12/2020 3:21:17 PM This report has been signed electronically. Number of Addenda: 0

## 2020-05-12 NOTE — Anesthesia Preprocedure Evaluation (Signed)
Anesthesia Evaluation  Patient identified by MRN, date of birth, ID band Patient awake    Reviewed: Allergy & Precautions, NPO status , Patient's Chart, lab work & pertinent test results  History of Anesthesia Complications (+) PONV and history of anesthetic complications  Airway Mallampati: III  TM Distance: >3 FB Neck ROM: Full    Dental  (+) Dental Advisory Given, Poor Dentition, Missing, Chipped   Pulmonary shortness of breath, sleep apnea , Recent URI , Resolved, former smoker,  States unable to tolerate CPAP Denies O2 use smoking, or breathing meds    Pulmonary exam normal breath sounds clear to auscultation       Cardiovascular Exercise Tolerance: Poor hypertension, Pt. on medications Normal cardiovascular examII Rhythm:Regular Rate:Normal  1. Left ventricular ejection fraction, by estimation, is 55 to 60%. The  left ventricle has normal function. The left ventricle has no regional  wall motion abnormalities. There is mild concentric left ventricular  hypertrophy. Left ventricular diastolic  parameters are indeterminate.  2. Right ventricular systolic function is normal. The right ventricular  size is normal. There is normal pulmonary artery systolic pressure.  3. Left atrial size was severely dilated.  4. Right atrial size was mildly dilated.  5. The mitral valve is degenerative. Mild mitral valve regurgitation.  6. The aortic valve is tricuspid. Aortic valve regurgitation is not  visualized. No aortic stenosis is present.  7. The inferior vena cava is dilated in size with >50% respiratory  variability, suggesting right atrial pressure of 8 mmHg.    Neuro/Psych  Headaches, PSYCHIATRIC DISORDERS Anxiety Depression  Neuromuscular disease negative neurological ROS  negative psych ROS   GI/Hepatic negative GI ROS, Neg liver ROS, hiatal hernia, PUD, GERD  Medicated and Controlled,Abdominal pain and nausea    Endo/Other  negative endocrine ROSHypothyroidism   Renal/GU Renal InsufficiencyRenal diseasenegative Renal ROS  negative genitourinary   Musculoskeletal negative musculoskeletal ROS (+) Arthritis , Osteoarthritis,    Abdominal   Peds negative pediatric ROS (+)  Hematology negative hematology ROS (+) anemia ,   Anesthesia Other Findings   Reproductive/Obstetrics negative OB ROS                             Anesthesia Physical  Anesthesia Plan  ASA: III  Anesthesia Plan: General   Post-op Pain Management:    Induction: Intravenous  PONV Risk Score and Plan: 4 or greater and TIVA and Propofol infusion  Airway Management Planned: Simple Face Mask, Nasal Cannula and Natural Airway  Additional Equipment:   Intra-op Plan:   Post-operative Plan:   Informed Consent: I have reviewed the patients History and Physical, chart, labs and discussed the procedure including the risks, benefits and alternatives for the proposed anesthesia with the patient or authorized representative who has indicated his/her understanding and acceptance.     Dental advisory given  Plan Discussed with: CRNA and Surgeon  Anesthesia Plan Comments:         Anesthesia Quick Evaluation

## 2020-05-14 ENCOUNTER — Encounter: Payer: Self-pay | Admitting: Internal Medicine

## 2020-05-14 ENCOUNTER — Telehealth: Payer: Self-pay

## 2020-05-14 LAB — SURGICAL PATHOLOGY

## 2020-05-14 NOTE — Telephone Encounter (Signed)
Pt would like a refill of Zofran 4mg sent to her pharmacy.  

## 2020-05-15 ENCOUNTER — Other Ambulatory Visit: Payer: Self-pay | Admitting: Gastroenterology

## 2020-05-15 MED ORDER — ONDANSETRON HCL 4 MG PO TABS: 4.0000 mg | ORAL_TABLET | Freq: Three times a day (TID) | ORAL | 1 refills | Status: DC | PRN

## 2020-05-15 NOTE — Telephone Encounter (Signed)
Rx sent 

## 2020-05-16 NOTE — Telephone Encounter (Signed)
Noted  

## 2020-05-21 ENCOUNTER — Encounter (HOSPITAL_COMMUNITY): Payer: Self-pay | Admitting: Internal Medicine

## 2020-06-02 ENCOUNTER — Telehealth: Payer: Self-pay | Admitting: Cardiology

## 2020-06-02 NOTE — Telephone Encounter (Signed)
Per phone call from pt- she would like to know if ondansetron (ZOFRAN) 4 MG tablet [[id number]]  Would cause her to have a fast heart beat   Please call pt

## 2020-06-02 NOTE — Telephone Encounter (Signed)
I spoke with daughter. I advised her to speak with provider or pharmacist who prescribed zofran . She is also taking levothyroxine and I do not see any current labs. Daughter asked me to hold and I did for as long as I could and had to end call.I returned call to daughter.   Seen 04/15/20 in office visit and had apparently stopped diltiazem. Daughter unaware of what meds her mother takes. States she just recently moved in with her. Says mother told her Atenolol made her feel funny so she reduced dose back to 25 mg daily but is unsure when she did that. Daughter is willing to help mother with medications but wants to know what meds/dose  we want her take.

## 2020-06-02 NOTE — Telephone Encounter (Signed)
I met her recently in the office.  At that time suggestion was to increase atenolol to 50 mg daily for better heart rate control, but if she felt worse on that dose, would agree with cutting back to 25 mg daily for now.

## 2020-06-02 NOTE — Telephone Encounter (Signed)
Left message for daughter Donella Stade that Dr.McDowell agrees with reduced dose of Atenolol for now and to follow up as planned in June.Left messge to cal back if she has more questions.

## 2020-06-06 ENCOUNTER — Telehealth: Payer: Self-pay | Admitting: Internal Medicine

## 2020-06-06 NOTE — Telephone Encounter (Signed)
Pt has a question about a medication. [phone]

## 2020-06-09 ENCOUNTER — Telehealth: Payer: Self-pay

## 2020-06-09 DIAGNOSIS — K439 Ventral hernia without obstruction or gangrene: Secondary | ICD-10-CM

## 2020-06-09 NOTE — Telephone Encounter (Signed)
Already done in another note

## 2020-06-09 NOTE — Telephone Encounter (Signed)
Per Dr. Roseanne Kaufman recent colonoscopy report: recommend patient see her bariatric surgeon for consideration of repair of a right sided spigelian hernia seen on CT which likely explains her right-sided abdominal pain.   We can arrange for her to see her bariatric surgeon or if she does not recall who that is, we can send her to [facility] (that's where we sent referral in 07/2019 but her daughter would not take her per pt).  As far as nausea, may be secondary to underlying gastroparesis. She has h/o ulcer but documented healing in 2021. I am hesitant in given her phenergan due to her age and risk for sedation/falls. If she feels odansetron makes her sob, then she probably should not take it.   Options limited. We can try low dose reglan twice a day BUT she will need to watch for any atypical uncontrollable movement in her body/face and if develops, stop medication immediately. Can be permanent, typically occurs with higher dose/long-term use.

## 2020-06-09 NOTE — Telephone Encounter (Signed)
Phoned the pt back LMOVM for her to return call

## 2020-06-09 NOTE — Telephone Encounter (Signed)
Sorry had to be interrupted 3 times before completing, when was the last time taking it and she said Friday, but she "knows she is going to vomit tomorrow " if she doesn't get something stronger to take. The second reason is she wants a consultation with a surgeon in [place] regarding her abd issues. Stated she had appt there before but her daughter would not take her. (regarding gastric bypass). She didn't remember who it was.

## 2020-06-09 NOTE — Telephone Encounter (Signed)
Spoke with the pt's daughter [name] @ [phone] (she called here and LM for me regarding her mom). The pt's daughter wants her mom to referred back to [facility]. She is declining phenergan and Reglan. She states her mother had no problems over the weekend taking the Ondansetron. She states her mother is just wanting medications that will keep her sleep and not deal with life. She also let me know in a few weeks her mother will be living on her own and she wants to try and fix the issues before this happens. (there were personal issues discussed that I will not print but the daughter would really like to speak with you. Please advise

## 2020-06-09 NOTE — Telephone Encounter (Signed)
[name], The pt called agin this morning and advised me that she wants a stronger nausea medication sent in. She states the Odansetron makes her more nauseated also she noticed some SOB with it. I asked her

## 2020-06-10 ENCOUNTER — Other Ambulatory Visit: Payer: Self-pay | Admitting: Family Medicine

## 2020-06-12 NOTE — Telephone Encounter (Signed)
Referral placed to [facility]

## 2020-06-12 NOTE — Addendum Note (Signed)
Addended by: Cheron Every on: 06/12/2020 04:22 PM   Modules accepted: Orders

## 2020-06-12 NOTE — Telephone Encounter (Signed)
LMOAM for [employer]. Requested she return call if she had additional questions or concerns.    [facility] clinical pool and Manuela Schwartz:  Please make referral to [place] surgery for consideration of repair of right lateral abdominal wall hernia, and small right-sided spigelian hernia without bowel involvement (not reported but reviewed with radiology and seen on March 2021 CT as well as previous studies),   Make sure my ov note from 03/04/20 is sent with referral.  Send CT A/P report from 03/01/2019 Send CT A/P report from 08/15/2017

## 2020-06-17 NOTE — Telephone Encounter (Signed)
CC'ed ov and CT to [facility]

## 2020-06-18 DIAGNOSIS — R110 Nausea: Secondary | ICD-10-CM | POA: Diagnosis not present

## 2020-06-18 DIAGNOSIS — I482 Chronic atrial fibrillation, unspecified: Secondary | ICD-10-CM | POA: Diagnosis not present

## 2020-06-18 DIAGNOSIS — Z9884 Bariatric surgery status: Secondary | ICD-10-CM | POA: Diagnosis not present

## 2020-06-18 DIAGNOSIS — R0600 Dyspnea, unspecified: Secondary | ICD-10-CM | POA: Diagnosis not present

## 2020-07-15 DIAGNOSIS — R1031 Right lower quadrant pain: Secondary | ICD-10-CM | POA: Diagnosis not present

## 2020-07-15 DIAGNOSIS — D509 Iron deficiency anemia, unspecified: Secondary | ICD-10-CM | POA: Diagnosis not present

## 2020-07-15 DIAGNOSIS — E538 Deficiency of other specified B group vitamins: Secondary | ICD-10-CM | POA: Diagnosis not present

## 2020-07-15 DIAGNOSIS — R600 Localized edema: Secondary | ICD-10-CM | POA: Diagnosis not present

## 2020-07-15 DIAGNOSIS — G4700 Insomnia, unspecified: Secondary | ICD-10-CM | POA: Diagnosis not present

## 2020-07-15 DIAGNOSIS — E782 Mixed hyperlipidemia: Secondary | ICD-10-CM | POA: Diagnosis not present

## 2020-07-15 DIAGNOSIS — M064 Inflammatory polyarthropathy: Secondary | ICD-10-CM | POA: Diagnosis not present

## 2020-07-15 DIAGNOSIS — R229 Localized swelling, mass and lump, unspecified: Secondary | ICD-10-CM | POA: Diagnosis not present

## 2020-07-15 DIAGNOSIS — I10 Essential (primary) hypertension: Secondary | ICD-10-CM | POA: Diagnosis not present

## 2020-07-15 DIAGNOSIS — M48062 Spinal stenosis, lumbar region with neurogenic claudication: Secondary | ICD-10-CM | POA: Diagnosis not present

## 2020-07-15 DIAGNOSIS — E039 Hypothyroidism, unspecified: Secondary | ICD-10-CM | POA: Diagnosis not present

## 2020-07-15 DIAGNOSIS — K2100 Gastro-esophageal reflux disease with esophagitis, without bleeding: Secondary | ICD-10-CM | POA: Diagnosis not present

## 2020-07-16 NOTE — Progress Notes (Signed)
Cardiology Office Note    Date:  07/17/2020   ID:  [name], [place] 07-30-1945, MRN [id number]  PCP:  Lemmie Evens, MD  Cardiologist: Rozann Lesches, MD    Chief Complaint  Patient presents with   Follow-up    3 month visit    History of Present Illness:    [name] is a 75 y.o. female with past medical history of persistent atrial fibrillation, chronic back pain, hypothyroidism and anemia who presents to the office today for 31-month follow-up.  She was examined by Dr. Domenic Polite in 03/2020 and reported intermittent palpitations but denied any recent chest pain. She was still undergoing work-up for anemia by GI but denied any melena or hematochezia. For unclear reasons, she was not taking Cardizem CD as she reported that a prior provider had discontinued this. Atenolol was titrated from 25 mg daily to 50 mg daily with plans to consider potential anticoagulation in the future pending further work-up from GI. It was recommended that if she was not felt to be any good candidate for anticoagulation, could refer for consideration of a Watchman device.   She did undergo colonoscopy in 04/2020 which showed polyps and diverticuli. She has been referred to [facility] by GI given her noted right spigelian hernia on CT and in the setting of her likely gastroparesis as she continues to have frequent vomiting. Also called the office in the interim reporting she did not feel well with Atenolol 50 mg daily and this was reduced to 25 mg daily.  In talking with the patient and her daughter today, she does report she was able to increase Atenolol to 50 mg daily and has now been tolerating this well. She still experiences frequent palpitations but does not have a way of checking her heart rate at home. Her pulse is elevated to 109 during today's visit with similar values by recheck. She reports feeling fatigued but denies any specific exertional chest pain or dyspnea on exertion. No recent  orthopnea, PND or pitting edema. She is planning to take a cruise with her daughter in October and they are hopeful her energy level will improve by then.    Past Medical History:  Diagnosis Date   Anemia    Anxiety    Arthritis    Atrial fibrillation (HCC)    Chronic back pain    Spondylosis and stenosis   Depression    GERD (gastroesophageal reflux disease)    Headache(784.0)    Hiatal hernia    History of blood transfusion    History of GI bleed    Hypothyroidism    Nephrolithiasis    Obsessive-compulsive disorder    Recurrent upper respiratory infection (URI)    Sleep apnea     Past Surgical History:  Procedure Laterality Date   ABDOMINAL HYSTERECTOMY     ANTERIOR CERVICAL DECOMP/DISCECTOMY FUSION  12/09/2010   Procedure: ANTERIOR CERVICAL DECOMPRESSION/DISCECTOMY FUSION 3 LEVELS;  Surgeon: Ophelia Charter;  Location: [facility];  Service: Neurosurgery;  Laterality: N/A;  Cervical three-four,Cervical four-five,Cervical Five-Six,Cervical Six-Seven ANTERIOR CERVICAL DECOMPRESSION WITH FUSION INTERBODY PROTHESIS PLATING AND BONEGRAFT   APPENDECTOMY     BIOPSY N/A 11/14/2014   Procedure: GASTRIC BIOPSY;  Surgeon: Daneil Dolin, MD;  Location: [facility];  Service: Endoscopy;  Laterality: N/A;   CESAREAN SECTION     CHOLECYSTECTOMY     COLONOSCOPY WITH PROPOFOL N/A 11/14/2014   RMR: redundant colon but otherwise normal   COLONOSCOPY WITH PROPOFOL N/A 05/12/2020  Procedure: COLONOSCOPY WITH PROPOFOL;  Surgeon: Daneil Dolin, MD;  Location: [facility];  Service: Endoscopy;  Laterality: N/A;  am or pm   ESOPHAGOGASTRODUODENOSCOPY (EGD) WITH PROPOFOL N/A 11/14/2014   RMR"s/p gastric surgery/anastomtic ulcer   ESOPHAGOGASTRODUODENOSCOPY (EGD) WITH PROPOFOL N/A 09/15/2017   Procedure: ESOPHAGOGASTRODUODENOSCOPY (EGD) WITH PROPOFOL;  Surgeon: Daneil Dolin, MD;  Location: [facility];  Service: Endoscopy;  Laterality: N/A;  11:30am   ESOPHAGOGASTRODUODENOSCOPY (EGD) WITH  PROPOFOL N/A 04/04/2019   2 cm ulcer crater at anastomosis with smaller satellite areas of ulceration in setting of NSAIDs/Goody powders, no bleeding stigmata.   ESOPHAGOGASTRODUODENOSCOPY (EGD) WITH PROPOFOL N/A 06/13/2019   [place]: Prior gastric bypass surgery with Billroth II type anatomy, otherwise normal-appearing residual upper GI tract.   EYE SURGERY     bilateral cataracts removed, w/IOL   GASTRIC BYPASS  2008   JOINT REPLACEMENT     07/2010 &  2002- respectively- both knees    MALONEY DILATION N/A 09/15/2017   Procedure: MALONEY DILATION;  Surgeon: Daneil Dolin, MD;  Location: [facility];  Service: Endoscopy;  Laterality: N/A;   PARATHYROIDECTOMY     PERIPHERALLY INSERTED CENTRAL CATHETER INSERTION     PICC line for treatment of MRSA   POLYPECTOMY  05/12/2020   Procedure: POLYPECTOMY;  Surgeon: Daneil Dolin, MD;  Location: [facility];  Service: Endoscopy;;   TONSILLECTOMY     as a child    Current Medications: Outpatient Medications Prior to Visit  Medication Sig Dispense Refill   atenolol (TENORMIN) 50 MG tablet Take 1 tablet (50 mg total) by mouth daily. 90 tablet 3   Cyanocobalamin (VITAMIN B-12 IJ) Inject 0.1 mLs as directed every 30 (thirty) days.     levothyroxine (SYNTHROID, LEVOTHROID) 75 MCG tablet Take 75 mcg by mouth daily before breakfast.     lubiprostone (AMITIZA) 24 MCG capsule Take 1 capsule (24 mcg total) by mouth 2 (two) times daily with a meal. 60 capsule 3   ondansetron (ZOFRAN) 4 MG tablet Take 1 tablet (4 mg total) by mouth 3 (three) times daily as needed for nausea or vomiting. 30 tablet 1   pantoprazole (PROTONIX) 40 MG tablet Take 40 mg by mouth 2 (two) times daily before a meal.     promethazine (PHENERGAN) 25 MG tablet Take 25 mg by mouth every 8 (eight) hours as needed.     venlafaxine (EFFEXOR) 75 MG tablet Take 75 mg by mouth 2 (two) times daily.      Vitamin D, Ergocalciferol, (DRISDOL) 50000 units CAPS capsule Take 50,000 Units by mouth  every 7 (seven) days.     No facility-administered medications prior to visit.     Allergies:   Benzodiazepines, Codeine, Escitalopram oxalate, Nsaids, Sertraline hcl, Statins, and Tramadol hcl   Social History   Socioeconomic History   Marital status: Widowed    Spouse name: Not on file   Number of children: 4   Years of education: Not on file   Highest education level: Not on file  Occupational History   Not on file  Tobacco Use   Smoking status: Former    Packs/day: 1.00    Years: 30.00    Pack years: 30.00    Types: Cigarettes    Quit date: 01/18/1998    Years since quitting: 22.5   Smokeless tobacco: Never  Vaping Use   Vaping Use: Never used  Substance and Sexual Activity   Alcohol use: No   Drug use: No  Sexual activity: Not on file  Other Topics Concern   Not on file  Social History Narrative   Not on file   Social Determinants of Health   Financial Resource Strain: Not on file  Food Insecurity: Not on file  Transportation Needs: Not on file  Physical Activity: Not on file  Stress: Not on file  Social Connections: Not on file     Family History:  The patient's family history includes ADD / ADHD in her daughter; Anxiety disorder in her mother and sister; Bipolar disorder in her daughter; Breast cancer in her mother and sister; Heart attack in her father; OCD in her daughter.   Review of Systems:    Please see the history of present illness.     All other systems reviewed and are otherwise negative except as noted above.   Physical Exam:    VS:  BP 124/72   Pulse (!) 109   Ht 5\' 6"  (1.676 m)   Wt 188 lb 12.8 oz (85.6 kg)   SpO2 99%   BMI 30.47 kg/m    General: Well developed, well nourished,female appearing in no acute distress. Head: Normocephalic, atraumatic. Neck: No carotid bruits. JVD not elevated.  Lungs: Respirations regular and unlabored, without wheezes or rales.  Heart: Irregularly irregular. No S3 or S4.  No murmur, no rubs, or  gallops appreciated. Abdomen: Appears non-distended. No obvious abdominal masses. Msk:  Strength and tone appear normal for age. No obvious joint deformities or effusions. Extremities: No clubbing or cyanosis. Edema along ankles bilaterally. No pitting edema.  Distal pedal pulses are 2+ bilaterally. Neuro: Alert and oriented X 3. Moves all extremities spontaneously. No focal deficits noted. Psych:  Responds to questions appropriately with a normal affect. Skin: No rashes or lesions noted  Wt Readings from Last 3 Encounters:  07/17/20 188 lb 12.8 oz (85.6 kg)  05/08/20 177 lb (80.3 kg)  04/15/20 177 lb (80.3 kg)     Studies/Labs Reviewed:   EKG:  EKG is not ordered today.   Recent Labs: 05/08/2020: Hemoglobin 14.0; Platelets 164   Lipid Panel No results found for: CHOL, TRIG, HDL, CHOLHDL, VLDL, LDLCALC, LDLDIRECT  Additional studies/ records that were reviewed today include:   Echocardiogram: 05/2019 IMPRESSIONS     1. Left ventricular ejection fraction, by estimation, is 55 to 60%. The  left ventricle has normal function. The left ventricle has no regional  wall motion abnormalities. There is mild concentric left ventricular  hypertrophy. Left ventricular diastolic  parameters are indeterminate.   2. Right ventricular systolic function is normal. The right ventricular  size is normal. There is normal pulmonary artery systolic pressure.   3. Left atrial size was severely dilated.   4. Right atrial size was mildly dilated.   5. The mitral valve is degenerative. Mild mitral valve regurgitation.   6. The aortic valve is tricuspid. Aortic valve regurgitation is not  visualized. No aortic stenosis is present.   7. The inferior vena cava is dilated in size with >50% respiratory  variability, suggesting right atrial pressure of 8 mmHg.   Assessment:    1. Longstanding persistent atrial fibrillation ([place])   2. Anemia, unspecified type   3. Hypothyroidism, unspecified type       Plan:   In order of problems listed above:  1. Persistent Atrial Fibrillation - She continues to have intermittent palpitations and heart rate is in the low 100's at rest during today's visit. By review of notes, she was previously on  Cardizem CD 240 mg daily but her daughter is unsure if she was taking this at that time as she was living with other family members. Given that she did have fatigue with recent dose adjustment of Atenolol, will continue Atenolol at current dose of 50 mg daily and try to add Cardizem CD 120 mg daily. We reviewed this could be titrated over time if needed. I also encouraged her to check her heart rate at home if able. - This patients CHA2DS2-VASc Score and unadjusted Ischemic Stroke Rate (% per year) is equal to 3.2 % stroke rate/year from a score of 3 (Female, Age, Aortic Plaque). She has not been on anticoagulation given her anemia and recurrent GIB with recent referral to [facility] by GI. Will await recommendations but will not start anticoagulation at this time. She is aware of the increased thromboembolic risk. As previously mentioned, she may be a Watchman candidate in the future if able to tolerate blood thinners for the short-term.  2. Anemia - She has been undergoing work-up by GI and most recent colonoscopy in 04/2020 showed polyps and diverticuli and she has now been referred to [facility] for evaluation of right spigelian hernia. Hemoglobin had improved to 14.0 by labs in 04/2020. She also continues to have frequent vomiting felt to be due to gastroparesis in the setting of prior gastric bypass.  3. Hypothyroidism - Followed by her PCP.  She remains on Levothyroxine 75 mcg daily.   Medication Adjustments/Labs and Tests Ordered: Current medicines are reviewed at length with the patient today.  Concerns regarding medicines are outlined above.  Medication changes, Labs and Tests ordered today are listed in the Patient  Instructions below. Patient Instructions  Medication Instructions:  Your physician has recommended you make the following change in your medication:   Restart Cardizem 120 mg Daily   *If you need a refill on your cardiac medications before your next appointment, please call your pharmacy*   Lab Work: NONE   If you have labs (blood work) drawn today and your tests are completely normal, you will receive your results only by: [place] (if you have MyChart) OR A paper copy in the mail If you have any lab test that is abnormal or we need to change your treatment, we will call you to review the results.   Testing/Procedures: NONE    Follow-Up: At [facility], you and your health needs are our priority.  As part of our continuing mission to provide you with exceptional heart care, we have created designated Provider Care Teams.  These Care Teams include your primary Cardiologist (physician) and Advanced Practice Providers (APPs -  Physician Assistants and Nurse Practitioners) who all work together to provide you with the care you need, when you need it.  We recommend signing up for the patient portal called "MyChart".  Sign up information is provided on this After Visit Summary.  MyChart is used to connect with patients for Virtual Visits (Telemedicine).  Patients are able to view lab/test results, encounter notes, upcoming appointments, etc.  Non-urgent messages can be sent to your provider as well.   To learn more about what you can do with MyChart, go to [email or web address].    Your next appointment:   2 month(s)  The format for your next appointment:   In Person  Provider:   Rozann Lesches, MD or Bernerd Pho, PA-C   Other Instructions Thank you for choosing [facility]!     Signed, [place]  Wynelle Fanny, PA-C  07/17/2020 5:21 PM    [facility] [address] [place], [place] [address] Phone: [phone] Fax: [phone]

## 2020-07-17 ENCOUNTER — Ambulatory Visit (INDEPENDENT_AMBULATORY_CARE_PROVIDER_SITE_OTHER): Payer: PPO | Admitting: Student

## 2020-07-17 ENCOUNTER — Encounter: Payer: Self-pay | Admitting: Student

## 2020-07-17 ENCOUNTER — Other Ambulatory Visit: Payer: Self-pay

## 2020-07-17 VITALS — BP 124/72 | HR 109 | Ht 66.0 in | Wt 188.8 lb

## 2020-07-17 DIAGNOSIS — D649 Anemia, unspecified: Secondary | ICD-10-CM

## 2020-07-17 DIAGNOSIS — E039 Hypothyroidism, unspecified: Secondary | ICD-10-CM | POA: Diagnosis not present

## 2020-07-17 DIAGNOSIS — I4811 Longstanding persistent atrial fibrillation: Secondary | ICD-10-CM | POA: Diagnosis not present

## 2020-07-17 MED ORDER — DILTIAZEM HCL ER COATED BEADS 120 MG PO CP24: 120.0000 mg | ORAL_CAPSULE | Freq: Every day | ORAL | 3 refills | Status: DC

## 2020-07-17 NOTE — Patient Instructions (Signed)
Medication Instructions:  Your physician has recommended you make the following change in your medication:   Restart Cardizem 120 mg Daily   *If you need a refill on your cardiac medications before your next appointment, please call your pharmacy*   Lab Work: NONE   If you have labs (blood work) drawn today and your tests are completely normal, you will receive your results only by: [place] (if you have MyChart) OR A paper copy in the mail If you have any lab test that is abnormal or we need to change your treatment, we will call you to review the results.   Testing/Procedures: NONE    Follow-Up: At [facility], you and your health needs are our priority.  As part of our continuing mission to provide you with exceptional heart care, we have created designated Provider Care Teams.  These Care Teams include your primary Cardiologist (physician) and Advanced Practice Providers (APPs -  Physician Assistants and Nurse Practitioners) who all work together to provide you with the care you need, when you need it.  We recommend signing up for the patient portal called "MyChart".  Sign up information is provided on this After Visit Summary.  MyChart is used to connect with patients for Virtual Visits (Telemedicine).  Patients are able to view lab/test results, encounter notes, upcoming appointments, etc.  Non-urgent messages can be sent to your provider as well.   To learn more about what you can do with MyChart, go to [email or web address].    Your next appointment:   2 month(s)  The format for your next appointment:   In Person  Provider:   Rozann Lesches, MD or Bernerd Pho, PA-C   Other Instructions Thank you for choosing [facility]!

## 2020-07-19 ENCOUNTER — Other Ambulatory Visit: Payer: Self-pay | Admitting: Gastroenterology

## 2020-07-20 ENCOUNTER — Encounter (HOSPITAL_COMMUNITY): Payer: Self-pay | Admitting: Emergency Medicine

## 2020-07-20 ENCOUNTER — Other Ambulatory Visit: Payer: Self-pay

## 2020-07-20 ENCOUNTER — Emergency Department (HOSPITAL_COMMUNITY)
Admission: EM | Admit: 2020-07-20 | Discharge: 2020-07-20 | Disposition: A | Discharge status: Home / Self Care | Payer: PPO | Attending: Emergency Medicine | Admitting: Emergency Medicine

## 2020-07-20 ENCOUNTER — Emergency Department (HOSPITAL_COMMUNITY): Payer: PPO

## 2020-07-20 DIAGNOSIS — Z85850 Personal history of malignant neoplasm of thyroid: Secondary | ICD-10-CM | POA: Insufficient documentation

## 2020-07-20 DIAGNOSIS — I10 Essential (primary) hypertension: Secondary | ICD-10-CM | POA: Diagnosis not present

## 2020-07-20 DIAGNOSIS — E039 Hypothyroidism, unspecified: Secondary | ICD-10-CM | POA: Diagnosis not present

## 2020-07-20 DIAGNOSIS — R21 Rash and other nonspecific skin eruption: Secondary | ICD-10-CM | POA: Diagnosis not present

## 2020-07-20 DIAGNOSIS — R1084 Generalized abdominal pain: Secondary | ICD-10-CM | POA: Diagnosis not present

## 2020-07-20 DIAGNOSIS — Z79899 Other long term (current) drug therapy: Secondary | ICD-10-CM | POA: Insufficient documentation

## 2020-07-20 DIAGNOSIS — Z87891 Personal history of nicotine dependence: Secondary | ICD-10-CM | POA: Insufficient documentation

## 2020-07-20 DIAGNOSIS — R600 Localized edema: Secondary | ICD-10-CM | POA: Insufficient documentation

## 2020-07-20 DIAGNOSIS — R609 Edema, unspecified: Secondary | ICD-10-CM

## 2020-07-20 DIAGNOSIS — R109 Unspecified abdominal pain: Secondary | ICD-10-CM | POA: Diagnosis not present

## 2020-07-20 DIAGNOSIS — Z96653 Presence of artificial knee joint, bilateral: Secondary | ICD-10-CM | POA: Insufficient documentation

## 2020-07-20 DIAGNOSIS — R112 Nausea with vomiting, unspecified: Secondary | ICD-10-CM | POA: Diagnosis not present

## 2020-07-20 LAB — CBC WITH DIFFERENTIAL/PLATELET
Abs Immature Granulocytes: 0.01 10*3/uL (ref 0.00–0.07)
Basophils Absolute: 0.1 10*3/uL (ref 0.0–0.1)
Basophils Relative: 1 %
Eosinophils Absolute: 0.1 10*3/uL (ref 0.0–0.5)
Eosinophils Relative: 1 %
HCT: 50 % — ABNORMAL HIGH (ref 36.0–46.0)
Hemoglobin: 16.5 g/dL — ABNORMAL HIGH (ref 12.0–15.0)
Immature Granulocytes: 0 %
Lymphocytes Relative: 38 %
Lymphs Abs: 2.5 10*3/uL (ref 0.7–4.0)
MCH: 34.6 pg — ABNORMAL HIGH (ref 26.0–34.0)
MCHC: 33 g/dL (ref 30.0–36.0)
MCV: 104.8 fL — ABNORMAL HIGH (ref 80.0–100.0)
Monocytes Absolute: 0.4 10*3/uL (ref 0.1–1.0)
Monocytes Relative: 7 %
Neutro Abs: 3.5 10*3/uL (ref 1.7–7.7)
Neutrophils Relative %: 53 %
Platelets: 124 10*3/uL — ABNORMAL LOW (ref 150–400)
RBC: 4.77 MIL/uL (ref 3.87–5.11)
RDW: 14.9 % (ref 11.5–15.5)
WBC: 6.6 10*3/uL (ref 4.0–10.5)
nRBC: 0 % (ref 0.0–0.2)

## 2020-07-20 LAB — COMPREHENSIVE METABOLIC PANEL
ALT: 27 U/L (ref 0–44)
AST: 33 U/L (ref 15–41)
Albumin: 4.1 g/dL (ref 3.5–5.0)
Alkaline Phosphatase: 47 U/L (ref 38–126)
Anion gap: 9 (ref 5–15)
BUN: 31 mg/dL — ABNORMAL HIGH (ref 8–23)
CO2: 24 mmol/L (ref 22–32)
Calcium: 9.4 mg/dL (ref 8.9–10.3)
Chloride: 103 mmol/L (ref 98–111)
Creatinine, Ser: 1.25 mg/dL — ABNORMAL HIGH (ref 0.44–1.00)
GFR, Estimated: 46 mL/min — ABNORMAL LOW (ref >60)
Glucose, Bld: 121 mg/dL — ABNORMAL HIGH (ref 70–99)
Potassium: 3.6 mmol/L (ref 3.5–5.1)
Sodium: 136 mmol/L (ref 135–145)
Total Bilirubin: 1.3 mg/dL — ABNORMAL HIGH (ref 0.3–1.2)
Total Protein: 6.8 g/dL (ref 6.5–8.1)

## 2020-07-20 LAB — LIPASE, BLOOD: Lipase: 28 U/L (ref 11–51)

## 2020-07-20 MED ORDER — IOHEXOL 300 MG/ML  SOLN
80.0000 mL | Freq: Once | INTRAMUSCULAR | Status: AC | PRN
  Administered 2020-07-20: 80 mL via INTRAVENOUS

## 2020-07-20 MED ORDER — TRIAMCINOLONE ACETONIDE 0.1 % EX CREA: 1.0000 "application " | TOPICAL_CREAM | Freq: Two times a day (BID) | CUTANEOUS | 0 refills | Status: DC

## 2020-07-20 NOTE — ED Provider Notes (Signed)
[facility] EMERGENCY DEPARTMENT Provider Note   CSN: [id number] Arrival date & time: 07/20/20  1300     History Chief Complaint  Patient presents with   Leg Swelling    [name] is a 75 y.o. female.  she is here with a complaint of abdominal pain along with nausea and vomiting.  She said this has been going on a long time.  She sees Dr. Gala Romney from GI and is supposed to be getting a hernia repair.  She has had prior gastric bypass.  She sometimes has to use laxatives to move her bowels and this will help with her symptoms.  She is also complaining of some itchiness and redness to her left antecubital area where she is scratching.  She is also had some swelling of her ankles bilaterally after she dropped a fan on her foot.  This has been over a week.  The history is provided by the patient.  Abdominal Pain Pain location:  Generalized Pain quality: cramping   Pain severity:  Severe Onset quality:  Gradual Duration: months. Timing:  Intermittent Progression:  Unchanged Relieved by:  Nothing Worsened by:  Nothing Ineffective treatments: laxatives. Associated symptoms: nausea and vomiting   Associated symptoms: no chest pain, no cough, no dysuria, no fever, no shortness of breath and no sore throat       Past Medical History:  Diagnosis Date   Anemia    Anxiety    Arthritis    Atrial fibrillation (HCC)    Chronic back pain    Spondylosis and stenosis   Depression    GERD (gastroesophageal reflux disease)    Headache(784.0)    Hiatal hernia    History of blood transfusion    History of GI bleed    Hypothyroidism    Nephrolithiasis    Obsessive-compulsive disorder    Recurrent upper respiratory infection (URI)    Sleep apnea     Patient Active Problem List   Diagnosis Date Noted   Spigelian hernia 08/14/2019   Constipation 08/14/2019   Right sided abdominal pain    Persistent atrial fibrillation (HCC)    Sensory disturbance 06/11/2019   Intractable nausea and  vomiting 06/10/2019   Atrial fibrillation with RVR ([place]) 06/09/2019   Anastomotic ulcer    Acute anemia 04/04/2019   Anemia    Excessive use of nonsteroidal anti-inflammatory drug (NSAID)    Nausea with vomiting 03/01/2019   Diarrhea 03/01/2019   Abdominal pain, epigastric 06/08/2017   LLQ pain 06/08/2017   Odynophagia 06/08/2017   Dysphagia 06/08/2017   Copper deficiency 06/17/2015   Gastric ulceration    Hiatal hernia    Anemia, iron deficiency    Spondylolisthesis of lumbar region 01/01/2013   Palpitations 05/08/2012   Insomnia due to mental disorder 12/03/2011   Benzodiazepine causing adverse effect in therapeutic use 12/03/2011   right upper urethral calculus with obstructionh 04/08/2010   HYPOTHYROIDISM 03/26/2010   Anxiety state, unspecified 03/26/2010   H/O gastric bypass 03/26/2010   Hydronephrosis, right 01/27/2010   HEMOCCULT POSITIVE STOOL 08/11/2009   Abdominal pain 05/20/2009   BENIGN NEOPLASM OF PARATHYROID GLAND 03/19/2009   B12 deficiency 03/19/2009   HYPERCHOLESTEROLEMIA 03/19/2009   OBESITY, MORBID 03/19/2009   DEPRESSION/ANXIETY 03/19/2009   Essential hypertension 03/19/2009   GASTROESOPHAGEAL REFLUX DISEASE, CHRONIC 03/19/2009   DEGENERATIVE JOINT DISEASE 03/19/2009    Past Surgical History:  Procedure Laterality Date   ABDOMINAL HYSTERECTOMY     ANTERIOR CERVICAL DECOMP/DISCECTOMY FUSION  12/09/2010   Procedure:  ANTERIOR CERVICAL DECOMPRESSION/DISCECTOMY FUSION 3 LEVELS;  Surgeon: Ophelia Charter;  Location: [facility];  Service: Neurosurgery;  Laterality: N/A;  Cervical three-four,Cervical four-five,Cervical Five-Six,Cervical Six-Seven ANTERIOR CERVICAL DECOMPRESSION WITH FUSION INTERBODY PROTHESIS PLATING AND BONEGRAFT   APPENDECTOMY     BIOPSY N/A 11/14/2014   Procedure: GASTRIC BIOPSY;  Surgeon: Daneil Dolin, MD;  Location: [facility];  Service: Endoscopy;  Laterality: N/A;   CESAREAN SECTION     CHOLECYSTECTOMY     COLONOSCOPY WITH PROPOFOL  N/A 11/14/2014   RMR: redundant colon but otherwise normal   COLONOSCOPY WITH PROPOFOL N/A 05/12/2020   Procedure: COLONOSCOPY WITH PROPOFOL;  Surgeon: Daneil Dolin, MD;  Location: [facility];  Service: Endoscopy;  Laterality: N/A;  am or pm   ESOPHAGOGASTRODUODENOSCOPY (EGD) WITH PROPOFOL N/A 11/14/2014   RMR"s/p gastric surgery/anastomtic ulcer   ESOPHAGOGASTRODUODENOSCOPY (EGD) WITH PROPOFOL N/A 09/15/2017   Procedure: ESOPHAGOGASTRODUODENOSCOPY (EGD) WITH PROPOFOL;  Surgeon: Daneil Dolin, MD;  Location: [facility];  Service: Endoscopy;  Laterality: N/A;  11:30am   ESOPHAGOGASTRODUODENOSCOPY (EGD) WITH PROPOFOL N/A 04/04/2019   2 cm ulcer crater at anastomosis with smaller satellite areas of ulceration in setting of NSAIDs/Goody powders, no bleeding stigmata.   ESOPHAGOGASTRODUODENOSCOPY (EGD) WITH PROPOFOL N/A 06/13/2019   Rourk: Prior gastric bypass surgery with Billroth II type anatomy, otherwise normal-appearing residual upper GI tract.   EYE SURGERY     bilateral cataracts removed, w/IOL   GASTRIC BYPASS  2008   JOINT REPLACEMENT     07/2010 &  2002- respectively- both knees    MALONEY DILATION N/A 09/15/2017   Procedure: MALONEY DILATION;  Surgeon: Daneil Dolin, MD;  Location: [facility];  Service: Endoscopy;  Laterality: N/A;   PARATHYROIDECTOMY     PERIPHERALLY INSERTED CENTRAL CATHETER INSERTION     PICC line for treatment of MRSA   POLYPECTOMY  05/12/2020   Procedure: POLYPECTOMY;  Surgeon: Daneil Dolin, MD;  Location: [facility];  Service: Endoscopy;;   TONSILLECTOMY     as a child     OB History   No obstetric history on file.     Family History  Problem Relation Age of Onset   Anxiety disorder Mother    Breast cancer Mother    Anxiety disorder Sister    Bipolar disorder Daughter    OCD Daughter    ADD / ADHD Daughter    Heart attack Father    Breast cancer Sister    Anesthesia problems Neg Hx    Hypotension Neg Hx    Malignant hyperthermia  Neg Hx    Pseudochol deficiency Neg Hx    Dementia Neg Hx    Alcohol abuse Neg Hx    Drug abuse Neg Hx    Depression Neg Hx    Paranoid behavior Neg Hx    Schizophrenia Neg Hx    Seizures Neg Hx    Sexual abuse Neg Hx    Physical abuse Neg Hx    Colon cancer Neg Hx     Social History   Tobacco Use   Smoking status: Former    Packs/day: 1.00    Years: 30.00    Pack years: 30.00    Types: Cigarettes    Quit date: 01/18/1998    Years since quitting: 22.5   Smokeless tobacco: Never  Vaping Use   Vaping Use: Never used  Substance Use Topics   Alcohol use: No   Drug use: No    Home Medications Prior  to Admission medications   Medication Sig Start Date End Date Taking? Authorizing Provider  atenolol (TENORMIN) 50 MG tablet Take 1 tablet (50 mg total) by mouth daily. 04/15/20 07/17/20  Satira Sark, MD  Cyanocobalamin (VITAMIN B-12 IJ) Inject 0.1 mLs as directed every 30 (thirty) days.    [provider]  diltiazem (CARDIZEM CD) 120 MG 24 hr capsule Take 1 capsule (120 mg total) by mouth daily. 07/17/20 10/15/20  Strader, Fransisco Hertz, PA-C  levothyroxine (SYNTHROID, LEVOTHROID) 75 MCG tablet Take 75 mcg by mouth daily before breakfast.    [provider]  lubiprostone (AMITIZA) 24 MCG capsule Take 1 capsule (24 mcg total) by mouth 2 (two) times daily with a meal. 08/14/19   Mahala Menghini, PA-C  ondansetron (ZOFRAN) 4 MG tablet Take 1 tablet (4 mg total) by mouth 3 (three) times daily as needed for nausea or vomiting. 05/15/20   Erenest Rasher, PA-C  pantoprazole (PROTONIX) 40 MG tablet Take 40 mg by mouth 2 (two) times daily before a meal.    [provider]  promethazine (PHENERGAN) 25 MG tablet Take 25 mg by mouth every 8 (eight) hours as needed. 07/15/20   [provider]  venlafaxine (EFFEXOR) 75 MG tablet Take 75 mg by mouth 2 (two) times daily.     [provider]  Vitamin D, Ergocalciferol, (DRISDOL) 50000 units CAPS capsule  Take 50,000 Units by mouth every 7 (seven) days.    [provider]    Allergies    Benzodiazepines, Codeine, Escitalopram oxalate, Nsaids, Sertraline hcl, Statins, and Tramadol hcl  Review of Systems   Review of Systems  Constitutional:  Negative for fever.  HENT:  Negative for sore throat.   Eyes:  Negative for visual disturbance.  Respiratory:  Negative for cough and shortness of breath.   Cardiovascular:  Positive for leg swelling. Negative for chest pain.  Gastrointestinal:  Positive for abdominal pain, nausea and vomiting.  Genitourinary:  Negative for dysuria.  Musculoskeletal:  Positive for back pain.  Skin:  Positive for rash.  Neurological:  Negative for headaches.   Physical Exam Updated Vital Signs BP (!) 120/102 (BP Location: Left Arm)   Pulse (!) 106   Temp 97.9 F (36.6 C) (Oral)   Resp 17   Ht 5\' 6"  (1.676 m)   Wt 85.6 kg   SpO2 99%   BMI 30.47 kg/m   Physical Exam Vitals and nursing note reviewed.  Constitutional:      General: She is not in acute distress.    Appearance: She is well-developed.  HENT:     Head: Normocephalic and atraumatic.  Eyes:     Conjunctiva/sclera: Conjunctivae normal.  Cardiovascular:     Rate and Rhythm: Normal rate and regular rhythm.     Heart sounds: No murmur heard. Pulmonary:     Effort: Pulmonary effort is normal. No respiratory distress.     Breath sounds: Normal breath sounds.  Abdominal:     Palpations: Abdomen is soft.     Tenderness: There is no abdominal tenderness. There is no guarding or rebound.  Musculoskeletal:        General: No tenderness. Normal range of motion.     Cervical back: Neck supple.     Right lower leg: Edema present.     Left lower leg: Edema present.     Comments: Left antecubital fossa some thickened hypertrophic skin with some erythema.  Skin:    General: Skin is warm and dry.  Neurological:     General: No focal deficit present.     Mental Status: She is alert.    ED  Results / Procedures / Treatments   Labs (all labs ordered are listed, but only abnormal results are displayed) Labs Reviewed  COMPREHENSIVE METABOLIC PANEL - Abnormal; Notable for the following components:      Result Value   Glucose, Bld 121 (*)    BUN 31 (*)    Creatinine, Ser 1.25 (*)    Total Bilirubin 1.3 (*)    GFR, Estimated 46 (*)    All other components within normal limits  CBC WITH DIFFERENTIAL/PLATELET - Abnormal; Notable for the following components:   Hemoglobin 16.5 (*)    HCT 50.0 (*)    MCV 104.8 (*)    MCH 34.6 (*)    Platelets 124 (*)    All other components within normal limits  LIPASE, BLOOD  URINALYSIS, ROUTINE W REFLEX MICROSCOPIC    EKG None  Radiology CT ABDOMEN PELVIS W CONTRAST  Result Date: 07/20/2020 CLINICAL DATA:  Abdominal pain EXAM: CT ABDOMEN AND PELVIS WITH CONTRAST TECHNIQUE: Multidetector CT imaging of the abdomen and pelvis was performed using the standard protocol following bolus administration of intravenous contrast. CONTRAST:  80mL OMNIPAQUE IOHEXOL 300 MG/ML  SOLN COMPARISON:  06/09/2019 FINDINGS: LOWER CHEST: Small pleural effusions and bibasilar atelectasis. HEPATOBILIARY: Normal hepatic contours. No intra- or extrahepatic biliary dilatation. Status post cholecystectomy. PANCREAS: Fatty atrophy SPLEEN: Normal. ADRENALS/URINARY TRACT: The adrenal glands are normal. No hydronephrosis, nephroureterolithiasis or solid renal mass. The urinary bladder is normal for degree of distention STOMACH/BOWEL: There is no hiatal hernia. Normal duodenal course and caliber. Status post sleeve gastrectomy. Postsurgical changes of the distal transverse colon. No small bowel dilatation or inflammation. No focal colonic abnormality. Status post appendectomy. VASCULAR/LYMPHATIC: Normal course and caliber of the major abdominal vessels. No abdominal or pelvic lymphadenopathy. REPRODUCTIVE: Status post hysterectomy. No adnexal mass. MUSCULOSKELETAL. No bony spinal  canal stenosis or focal osseous abnormality. L4-5 PLIF. OTHER: Anasarca IMPRESSION: 1. No acute abnormality of the abdomen or pelvis. 2. Small pleural effusions and bibasilar atelectasis. 3. Anasarca. Electronically Signed   By: Ulyses Jarred M.D.   On: 07/20/2020 20:42    Procedures Procedures   Medications Ordered in ED Medications  iohexol (OMNIPAQUE) 300 MG/ML solution 80 mL (80 mLs Intravenous Contrast Given 07/20/20 2019)    ED Course  I have reviewed the triage vital signs and the nursing notes.  Pertinent labs & imaging results that were available during my care of the patient were reviewed by me and considered in my medical decision making (see chart for details).  Clinical Course as of 07/21/20 5027  Nancy Fetter Jul 20, 2020  2101 Patient work-up here has shown no acute findings that would require admission to the hospital.  Reviewed results with her.  Will prescribe topical steroids for her left arm rash.  Recommended support hose and leg elevation for her edema.  She is already following up with a surgeon regarding her abdominal issues. [MB]    Clinical Course User Index [MB] Hayden Rasmussen, MD   MDM Rules/Calculators/A&P                         This patient complains of nausea and vomiting, generalized abdominal pain, swelling and ankles left forearm rash; this involves an extensive number of treatment Options and is a complaint that carries with it a high risk of complications and Morbidity.  The differential includes obstruction, diverticulitis, colitis peripheral edema, DVT, CHF, rash  I ordered, reviewed and interpreted labs, which included CBC with normal white count, hemoglobin elevated similar to priors, chemistries stable from priors, LFTs fairly normal I ordered imaging studies which included CT abdomen pelvis and I independently    visualized and interpreted imaging which showed no acute findings Previous records obtained and reviewed in epic, no recent  admissions  After the interventions stated above, I reevaluated the patient and found patient to be fairly asymptomatic.  Will cover with some topical steroids for her arm.  Her abdominal symptoms seem to be baseline.  Recomended close follow-up with her treating providers.  Return instructions discussed   Final Clinical Impression(s) / ED Diagnoses Final diagnoses:  Generalized abdominal pain  Non-intractable vomiting with nausea, unspecified vomiting type  Rash and nonspecific skin eruption  Peripheral edema    Rx / DC Orders ED Discharge Orders          Ordered    triamcinolone cream (KENALOG) 0.1 %  2 times daily        07/20/20 2103             Hayden Rasmussen, MD 07/21/20 [phone]

## 2020-07-20 NOTE — ED Triage Notes (Addendum)
Pt c/o bilateral feet swelling and redness to her upper right arm that began 3 days ago.  Pt also states I vomit all the time. Pt is also complaining about growths on her abdomen with pain.

## 2020-07-20 NOTE — Discharge Instructions (Addendum)
You were seen in the emergency department for diffuse abdominal pain nausea and vomiting along with swelling of your feet and a rash on your left arm.  Your lab work did not show any significant abnormalities nor did your CAT scan.  We are prescribing you some steroid cream for your rash on your arm.  You should elevate your legs.  Please follow-up your primary care doctor and discuss support hose.  Follow-up with your surgeon regarding your abdominal issues.  Return to the emergency department if any worsening or concerning symptoms

## 2020-07-20 NOTE — ED Provider Notes (Signed)
Emergency Medicine Provider Triage Evaluation Note  [name] , a 75 y.o. female  was evaluated in triage.  Pt complains of left-sided lower abdominal pain.  Pain began 3 days ago.  She describes sharp pain that is worse to palpation.  Admits to nausea and vomiting but states that vomiting is a daily occurrence for her since having gastric bypass years ago.  She also complains of a localized area of redness to her left forearm.  Redness has been there for several days and she describes itching to the area.  She denies fever, black or bloody stools.  She is having some loose stools.  She denies any chest pain or shortness of breath.  Review of Systems  Positive: Left lower abdominal pain, vomiting loose stools. Negative: Chest pain, shortness of breath, fever, dysuria  Physical Exam  BP (!) 120/102 (BP Location: Left Arm)   Pulse (!) 106   Temp 97.9 F (36.6 C) (Oral)   Resp 17   Ht 5\' 6"  (1.676 m)   Wt 85.6 kg   SpO2 99%   BMI 30.47 kg/m  Gen:   Awake, no distress  Resp:  Normal effort  MSK:   Moves extremities without difficulty  Other:  BLE edema, left lower abdominal pain, rash left forearm  Medical Decision Making  Medically screening exam initiated at 5:03 PM.  Appropriate orders placed.  Stefanie Libel was informed that the remainder of the evaluation will be completed by another provider, this initial triage assessment does not replace that evaluation, and the importance of remaining in the ED until their evaluation is complete.  Patient here for evaluation of left lower abdominal pain for 3 days reports nausea and vomiting, but has vomiting daily since gastric bypass procedure.  Does have some loose stools that are new.  No fever or chills.  No dysuria she will need further evaluation in the emergency department patient agreeable to plan.   Kem Parkinson, PA-C 07/20/20 1708    Hayden Rasmussen, MD 07/21/20 1049

## 2020-07-20 NOTE — ED Notes (Signed)
Patient transported to CT 

## 2020-07-22 DIAGNOSIS — R110 Nausea: Secondary | ICD-10-CM | POA: Diagnosis not present

## 2020-07-22 DIAGNOSIS — R600 Localized edema: Secondary | ICD-10-CM | POA: Diagnosis not present

## 2020-07-31 DIAGNOSIS — R601 Generalized edema: Secondary | ICD-10-CM | POA: Diagnosis not present

## 2020-07-31 DIAGNOSIS — Z9884 Bariatric surgery status: Secondary | ICD-10-CM | POA: Diagnosis not present

## 2020-07-31 DIAGNOSIS — K432 Incisional hernia without obstruction or gangrene: Secondary | ICD-10-CM | POA: Diagnosis not present

## 2020-09-02 DIAGNOSIS — I10 Essential (primary) hypertension: Secondary | ICD-10-CM | POA: Diagnosis not present

## 2020-09-02 DIAGNOSIS — Z9884 Bariatric surgery status: Secondary | ICD-10-CM | POA: Diagnosis not present

## 2020-09-02 DIAGNOSIS — G2401 Drug induced subacute dyskinesia: Secondary | ICD-10-CM | POA: Diagnosis not present

## 2020-09-02 DIAGNOSIS — R109 Unspecified abdominal pain: Secondary | ICD-10-CM | POA: Diagnosis not present

## 2020-09-15 NOTE — Progress Notes (Signed)
Cardiology Office Note    Date:  09/16/2020   ID:  Kendra, [name] 07/18/1945, MRN [id number]  PCP:  Kendra Evens, MD  Cardiologist: Rozann Lesches, MD    Chief Complaint  Patient presents with   Follow-up    2 month visit    History of Present Illness:    [name] is a 75 y.o. female with past medical history of persistent atrial fibrillation, chronic back pain, hypothyroidism and anemia who presents to the office today for 80-monthfollow-up.   She was last examined by myself in 06/2020 and reported her palpitations had improved with dose adjustment of Atenolol but she would still experience intermittent symptoms. She had quit taking Cardizem CD 240 mg daily at some point within the past year for unclear reasons and this was restarted on a lower dose of '120mg'$  daily to help with her palpitations. She was not on anticoagulation given her recurrent GIB and was being followed by GI.   In talking with the patient and her daughter today, she reports still having frequent palpitations which she describes as her heart "skipping". She does feel that it is racing at times but does not have a way of checking her blood pressure or heart rate at home currently. Heart rate has improved since her last visit as it was previously in the 110's and is now in the 80's. She does report intermittent dizziness and by review of her medication list, she has been on both Atenolol and Toprol-XL as Toprol-XL was recently filled by her pharmacy. She denies any chest pain or dyspnea on exertion.  No reported orthopnea or PND.  She did experience intermittent lower extremity edema earlier this month and was prescribed a short course of diuretic therapy by her PCP but is no longer on this.  She does report intermittent nausea which has occurred since prior gastric bypass. [facility] but denies any recent melena or hematochezia.  Past Medical  History:  Diagnosis Date   Anemia    Anxiety    Arthritis    Atrial fibrillation (HCC)    Chronic back pain    Spondylosis and stenosis   Depression    GERD (gastroesophageal reflux disease)    Headache(784.0)    Hiatal hernia    History of blood transfusion    History of GI bleed    Hypothyroidism    Nephrolithiasis    Obsessive-compulsive disorder    Recurrent upper respiratory infection (URI)    Sleep apnea     Past Surgical History:  Procedure Laterality Date   ABDOMINAL HYSTERECTOMY     ANTERIOR CERVICAL DECOMP/DISCECTOMY FUSION  12/09/2010   Procedure: ANTERIOR CERVICAL DECOMPRESSION/DISCECTOMY FUSION 3 LEVELS;  Surgeon: JOphelia Charter  [facility];  Service: Neurosurgery;  Laterality: N/A;  Cervical three-four,Cervical four-five,Cervical Five-Six,Cervical Six-Seven ANTERIOR CERVICAL DECOMPRESSION WITH FUSION INTERBODY PROTHESIS PLATING AND BONEGRAFT   APPENDECTOMY     BIOPSY N/A 11/14/2014   Procedure: GASTRIC BIOPSY;  Surgeon: RDaneil Dolin MD;  Location: [facility];  Service: Endoscopy;  Laterality: N/A;   CESAREAN SECTION     CHOLECYSTECTOMY     COLONOSCOPY WITH PROPOFOL N/A 11/14/2014   RMR: redundant colon but otherwise normal   COLONOSCOPY WITH PROPOFOL N/A 05/12/2020   Procedure: COLONOSCOPY WITH PROPOFOL;  Surgeon: RDaneil Dolin MD;  Location: [facility];  Service: Endoscopy;  Laterality: N/A;  am or pm   ESOPHAGOGASTRODUODENOSCOPY (  EGD) WITH PROPOFOL N/A 11/14/2014   RMR"s/p gastric surgery/anastomtic ulcer   ESOPHAGOGASTRODUODENOSCOPY (EGD) WITH PROPOFOL N/A 09/15/2017   Procedure: ESOPHAGOGASTRODUODENOSCOPY (EGD) WITH PROPOFOL;  Surgeon: Daneil Dolin, MD;  [facility];  Service: Endoscopy;  Laterality: N/A;  11:30am   ESOPHAGOGASTRODUODENOSCOPY (EGD) WITH PROPOFOL N/A 04/04/2019   2 cm ulcer crater at anastomosis with smaller satellite areas of ulceration in setting of NSAIDs/Goody powders, no bleeding stigmata.    ESOPHAGOGASTRODUODENOSCOPY (EGD) WITH PROPOFOL N/A 06/13/2019   [place]: Prior gastric bypass surgery with Billroth II type anatomy, otherwise normal-appearing residual upper GI tract.   EYE SURGERY     bilateral cataracts removed, w/IOL   GASTRIC BYPASS  2008   JOINT REPLACEMENT     07/2010 &  2002- respectively- both knees    MALONEY DILATION N/A 09/15/2017   Procedure: MALONEY DILATION;  Surgeon: Daneil Dolin, MD;  [facility];  Service: Endoscopy;  Laterality: N/A;   PARATHYROIDECTOMY     PERIPHERALLY INSERTED CENTRAL CATHETER INSERTION     PICC line for treatment of MRSA   POLYPECTOMY  05/12/2020   Procedure: POLYPECTOMY;  Surgeon: Daneil Dolin, MD;  [facility];  Service: Endoscopy;;   TONSILLECTOMY     as a child    Current Medications: Outpatient Medications Prior to Visit  Medication Sig Dispense Refill   atenolol (TENORMIN) 50 MG tablet Take 1 tablet (50 mg total) by mouth daily. 90 tablet 3   Cyanocobalamin (VITAMIN B-12 IJ) Inject 0.1 mLs as directed every 30 (thirty) days.     diltiazem (CARDIZEM CD) 120 MG 24 hr capsule Take 1 capsule (120 mg total) by mouth daily. 90 capsule 3   furosemide (LASIX) 40 MG tablet Take 40 mg by mouth daily.     levothyroxine (SYNTHROID, LEVOTHROID) 75 MCG tablet Take 75 mcg by mouth daily before breakfast.     lubiprostone (AMITIZA) 24 MCG capsule TAKE 1 CAPSULE(24 MCG) BY MOUTH TWICE DAILY WITH A MEAL 60 capsule 3   pantoprazole (PROTONIX) 40 MG tablet Take 40 mg by mouth 2 (two) times daily before a meal.     potassium chloride (KLOR-CON) 10 MEQ tablet Take 10 mEq by mouth daily.     venlafaxine (EFFEXOR) 75 MG tablet Take 75 mg by mouth 2 (two) times daily.      Vitamin D, Ergocalciferol, (DRISDOL) 50000 units CAPS capsule Take 50,000 Units by mouth every 7 (seven) days.     metoprolol succinate (TOPROL-XL) 25 MG 24 hr tablet Take 25 mg by mouth daily.     ondansetron (ZOFRAN) 4 MG tablet Take 1 tablet (4 mg  total) by mouth 3 (three) times daily as needed for nausea or vomiting. (Patient not taking: Reported on 09/16/2020) 30 tablet 1   promethazine (PHENERGAN) 25 MG tablet Take 25 mg by mouth every 8 (eight) hours as needed. (Patient not taking: Reported on 09/16/2020)     triamcinolone cream (KENALOG) 0.1 % Apply 1 application topically 2 (two) times daily. Left arm rash (Patient not taking: Reported on 09/16/2020) 30 g 0   No facility-administered medications prior to visit.     Allergies:   Benzodiazepines, Codeine, Escitalopram oxalate, Nsaids, Sertraline hcl, Statins, and Tramadol hcl   Social History   Socioeconomic History   Marital status: Widowed    Spouse name: Not on file   Number of children: 4   Years of education: Not on file   Highest education level: Not on file  Occupational  History   Not on file  Tobacco Use   Smoking status: Former    Packs/day: 1.00    Years: 30.00    Pack years: 30.00    Types: Cigarettes    Quit date: 01/18/1998    Years since quitting: 22.6   Smokeless tobacco: Never  Vaping Use   Vaping Use: Never used  Substance and Sexual Activity   Alcohol use: No   Drug use: No   Sexual activity: Not on file  Other Topics Concern   Not on file  Social History Narrative   Not on file   Social Determinants of Health   Financial Resource Strain: Not on file  Food Insecurity: Not on file  Transportation Needs: Not on file  Physical Activity: Not on file  Stress: Not on file  Social Connections: Not on file     Family History:  The patient's family history includes ADD / ADHD in her daughter; Anxiety disorder in her mother and sister; Bipolar disorder in her daughter; Breast cancer in her mother and sister; Heart attack in her father; OCD in her daughter.   Review of Systems:    Please see the history of present illness.     All other systems reviewed and are otherwise negative except as noted above.   Physical Exam:    VS:  BP 130/80    Pulse 84   Ht '5\' 5"'$  (1.651 m)   Wt 171 lb (77.6 kg)   SpO2 98%   BMI 28.46 kg/m    General: Well developed, well nourished,female appearing in no acute distress. Head: Normocephalic, atraumatic. Neck: No carotid bruits. JVD not elevated.  Lungs: Respirations regular and unlabored, without wheezes or rales.  Heart: Irregularly irregular. No S3 or S4.  No murmur, no rubs, or gallops appreciated. Abdomen: Appears non-distended. No obvious abdominal masses. Msk:  Strength and tone appear normal for age. No obvious joint deformities or effusions. Extremities: No clubbing or cyanosis. No pitting edema.  Distal pedal pulses are 2+ bilaterally. Neuro: Alert and oriented X 3. Moves all extremities spontaneously. No focal deficits noted. Psych:  Responds to questions appropriately with a normal affect. Skin: No rashes or lesions noted  Wt Readings from Last 3 Encounters:  09/16/20 171 lb (77.6 kg)  07/20/20 188 lb 12.8 oz (85.6 kg)  07/17/20 188 lb 12.8 oz (85.6 kg)     Studies/Labs Reviewed:   EKG:  EKG is not ordered today.   Recent Labs: 07/20/2020: ALT 27; BUN 31; Creatinine, Ser 1.25; Hemoglobin 16.5; Platelets 124; Potassium 3.6; Sodium 136   Lipid Panel No results found for: CHOL, TRIG, HDL, CHOLHDL, VLDL, LDLCALC, LDLDIRECT  Additional studies/ records that were reviewed today include:   Echocardiogram: 05/2019 IMPRESSIONS     1. Left ventricular ejection fraction, by estimation, is 55 to 60%. The  left ventricle has normal function. The left ventricle has no regional  wall motion abnormalities. There is mild concentric left ventricular  hypertrophy. Left ventricular diastolic  parameters are indeterminate.   2. Right ventricular systolic function is normal. The right ventricular  size is normal. There is normal pulmonary artery systolic pressure.   3. Left atrial size was severely dilated.   4. Right atrial size was mildly dilated.   5. The mitral valve is degenerative.  Mild mitral valve regurgitation.   6. The aortic valve is tricuspid. Aortic valve regurgitation is not  visualized. No aortic stenosis is present.   7. The inferior vena cava is dilated  in size with >50% respiratory  variability, suggesting right atrial pressure of 8 mmHg.   Assessment:    1. Longstanding persistent atrial fibrillation ([place])   2. Anemia, unspecified type   3. Hypothyroidism, unspecified type      Plan:   In order of problems listed above:  1. Persistent Atrial Fibrillation - She continues to report intermittent palpitations but her heart rate has overall improved when compared to prior office visits. She has been on dual beta-blocker therapy which is likely contributing to her dizziness and I recommended that she only take Atenolol 50 mg daily as she previously had worsening dizziness with Toprol-XL in the past. She was restarted on Cardizem CD at the time of her last visit and we reviewed possible titration of this or having her wear a Zio-patch for 1 week as she reports frequent palpitations but her daughter says she does not complain of such symptoms at home. Will plan for a 1 week Zio patch to assess rate control. If overall elevated rates, would plan to further titrate Cardizem CD to her prior dosing of 240 mg daily. She is not a candidate for antiarrhythmic options at this time as she is not currently on anticoagulation due to anemia. The patient and her family are aware of the increased thromboembolic risk. As discussed in prior notes, could consider a Watchman Device in the future if cleared to be on anticoagulation for the short-term. - She has also been consuming 3-4 "zero-sugar" sodas on a daily basis but they do contain caffeine. I recommended switching to caffeine-free to see if this helps with her palpitations as well.   2. Anemia - Her hemoglobin was previously as low as 7 in 05/2019 but had normalized to 16.5 by recent labs in 07/2020 and she denies any  evidence of active bleeding. She is being followed by [facility] for malabsorption since prior gastric bypass and due to her spigelian hernia.  3. Hypothyroidism - Followed by her PCP and her daughter reports she had recent labs and was told her thyroid was within a normal range. Will request a copy. She remains on Levothyroxine 75 mcg daily.   Medication Adjustments/Labs and Tests Ordered: Current medicines are reviewed at length with the patient today.  Concerns regarding medicines are outlined above.  Medication changes, Labs and Tests ordered today are listed in the Patient Instructions below. Patient Instructions  Medication Instructions:  Your physician recommends that you continue on your current medications as directed. Please refer to the Current Medication list given to you today.  *If you need a refill on your cardiac medications before your next appointment, please call your pharmacy*   Lab Work: NONE   If you have labs (blood work) drawn today and your tests are completely normal, you will receive your results only by: [place] (if you have MyChart) OR A paper copy in the mail If you have any lab test that is abnormal or we need to change your treatment, we will call you to review the results.   Testing/Procedures: [place] [place]- Long Term Monitor Instructions   Your physician has requested you wear your ZIO patch monitor___7__days.   This is a single patch monitor.  [employer] supplies one patch monitor per enrollment.  Additional stickers are not available.   Please do not apply patch if you will be having a Nuclear Stress Test, Echocardiogram, Cardiac CT, MRI, or Chest Xray during the time frame you would be wearing the monitor. The patch cannot  be worn during these tests.  You cannot remove and re-apply the ZIO XT patch monitor.   Your ZIO patch monitor will be sent USPS Priority mail from [facility] directly to your home address. The monitor may  also be mailed to a PO BOX if home delivery is not available.   It may take 3-5 days to receive your monitor after you have been enrolled.   Once you have received you monitor, please review enclosed instructions.  Your monitor has already been registered assigning a specific monitor serial # to you.   Applying the monitor   Shave hair from upper left chest.   Hold abrader disc by orange tab.  Rub abrader in 40 strokes over left upper chest as indicated in your monitor instructions.   Clean area with 4 enclosed alcohol pads .  Use all pads to assure are is cleaned thoroughly.  Let dry.   Apply patch as indicated in monitor instructions.  Patch will be place under collarbone on left side of chest with arrow pointing upward.   Rub patch adhesive wings for 2 minutes.Remove white label marked "1".  Remove white label marked "2".  Rub patch adhesive wings for 2 additional minutes.   While looking in a mirror, press and release button in center of patch.  A small green light will flash 3-4 times .  This will be your only indicator the monitor has been turned on.     Do not shower for the first 24 hours.  You may shower after the first 24 hours.   Press button if you feel a symptom. You will hear a small click.  Record Date, Time and Symptom in the Patient Log Book.   When you are ready to remove patch, follow instructions on last 2 pages of Patient Log Book.  Stick patch monitor onto last page of Patient Log Book.   Place Patient Log Book in [place] box.  Use locking tab on box and tape box closed securely.  The [employer] and [employer] has [employer] on it.  Please place in mailbox as soon as possible.  Your physician should have your test results approximately 7 days after the monitor has been mailed back to [facility].   Call [place] at [phone] if you have questions regarding your ZIO XT patch monitor.  Call them immediately if you see an orange light blinking on  your monitor.   If your monitor falls off in less than 4 days contact our Monitor department at [phone].  If your monitor becomes loose or falls off after 4 days call [employer] at [phone] for suggestions on securing your monitor.     Follow-Up: At [facility] you and your health needs are our priority.  As part of our continuing mission to provide you with exceptional heart care, we have created designated Provider Care Teams.  These Care Teams include your primary Cardiologist (physician) and Advanced Practice Providers (APPs -  Physician Assistants and Nurse Practitioners) who all work together to provide you with the care you need, when you need it.  We recommend signing up for the patient portal called "MyChart".  Sign up information is provided on this After Visit Summary.  MyChart is used to connect with patients for Virtual Visits (Telemedicine).  Patients are able to view lab/test results, encounter notes, upcoming appointments, etc.  Non-urgent messages can be sent to your provider as well.   To learn more about what you can do  with MyChart, go to [email or web address].    Your next appointment:   3-4 month(s)  The format for your next appointment:   In Person  Provider:   Rozann Lesches, MD   Other Instructions Thank you for choosing [facility]!     Signed, Erma Heritage, PA-C  09/16/2020 3:01 PM    [place] [address] [place], [place] [address] Phone: [phone] Fax: [phone]

## 2020-09-16 ENCOUNTER — Ambulatory Visit (INDEPENDENT_AMBULATORY_CARE_PROVIDER_SITE_OTHER): Payer: PPO

## 2020-09-16 ENCOUNTER — Other Ambulatory Visit: Payer: Self-pay | Admitting: Student

## 2020-09-16 ENCOUNTER — Other Ambulatory Visit: Payer: Self-pay

## 2020-09-16 ENCOUNTER — Ambulatory Visit (INDEPENDENT_AMBULATORY_CARE_PROVIDER_SITE_OTHER): Payer: PPO | Admitting: Student

## 2020-09-16 ENCOUNTER — Encounter: Payer: Self-pay | Admitting: Student

## 2020-09-16 VITALS — BP 130/80 | HR 84 | Ht 65.0 in | Wt 171.0 lb

## 2020-09-16 DIAGNOSIS — E039 Hypothyroidism, unspecified: Secondary | ICD-10-CM

## 2020-09-16 DIAGNOSIS — D649 Anemia, unspecified: Secondary | ICD-10-CM | POA: Diagnosis not present

## 2020-09-16 DIAGNOSIS — I4811 Longstanding persistent atrial fibrillation: Secondary | ICD-10-CM | POA: Diagnosis not present

## 2020-09-16 DIAGNOSIS — I4891 Unspecified atrial fibrillation: Secondary | ICD-10-CM

## 2020-09-16 DIAGNOSIS — R002 Palpitations: Secondary | ICD-10-CM

## 2020-09-16 NOTE — Patient Instructions (Signed)
Medication Instructions:  Your physician recommends that you continue on your current medications as directed. Please refer to the Current Medication list given to you today.  *If you need a refill on your cardiac medications before your next appointment, please call your pharmacy*   Lab Work: NONE   If you have labs (blood work) drawn today and your tests are completely normal, you will receive your results only by: [place] (if you have MyChart) OR A paper copy in the mail If you have any lab test that is abnormal or we need to change your treatment, we will call you to review the results.   Testing/Procedures: [facility]- Long Term Monitor Instructions   Your physician has requested you wear your ZIO patch monitor___7__days.   This is a single patch monitor.  [employer] supplies one patch monitor per enrollment.  Additional stickers are not available.   Please do not apply patch if you will be having a Nuclear Stress Test, Echocardiogram, Cardiac CT, MRI, or Chest Xray during the time frame you would be wearing the monitor. The patch cannot be worn during these tests.  You cannot remove and re-apply the ZIO XT patch monitor.   Your ZIO patch monitor will be sent USPS Priority mail from [facility] directly to your home address. The monitor may also be mailed to a PO BOX if home delivery is not available.   It may take 3-5 days to receive your monitor after you have been enrolled.   Once you have received you monitor, please review enclosed instructions.  Your monitor has already been registered assigning a specific monitor serial # to you.   Applying the monitor   Shave hair from upper left chest.   Hold abrader disc by orange tab.  Rub abrader in 40 strokes over left upper chest as indicated in your monitor instructions.   Clean area with 4 enclosed alcohol pads .  Use all pads to assure are is cleaned thoroughly.  Let dry.   Apply patch as indicated in monitor  instructions.  Patch will be place under collarbone on left side of chest with arrow pointing upward.   Rub patch adhesive wings for 2 minutes.Remove white label marked "1".  Remove white label marked "2".  Rub patch adhesive wings for 2 additional minutes.   While looking in a mirror, press and release button in center of patch.  A small green light will flash 3-4 times .  This will be your only indicator the monitor has been turned on.     Do not shower for the first 24 hours.  You may shower after the first 24 hours.   Press button if you feel a symptom. You will hear a small click.  Record Date, Time and Symptom in the Patient Log Book.   When you are ready to remove patch, follow instructions on last 2 pages of Patient Log Book.  Stick patch monitor onto last page of Patient Log Book.   Place Patient Log Book in [place] box.  Use locking tab on box and tape box closed securely.  The [employer] has [employer] on it.  Please place in mailbox as soon as possible.  Your physician should have your test results approximately 7 days after the monitor has been mailed back to [facility].   Call [place] at [phone] if you have questions regarding your ZIO XT patch monitor.  Call them immediately if you see an orange  light blinking on your monitor.   If your monitor falls off in less than 4 days contact our Monitor department at [phone].  If your monitor becomes loose or falls off after 4 days call [employer] at [phone] for suggestions on securing your monitor.     Follow-Up: At [facility], you and your health needs are our priority.  As part of our continuing mission to provide you with exceptional heart care, we have created designated Provider Care Teams.  These Care Teams include your primary Cardiologist (physician) and Advanced Practice Providers (APPs -  Physician Assistants and Nurse Practitioners) who all work together to provide you with  the care you need, when you need it.  We recommend signing up for the patient portal called "MyChart".  Sign up information is provided on this After Visit Summary.  MyChart is used to connect with patients for Virtual Visits (Telemedicine).  Patients are able to view lab/test results, encounter notes, upcoming appointments, etc.  Non-urgent messages can be sent to your provider as well.   To learn more about what you can do with MyChart, go to [email or web address].    Your next appointment:   3-4 month(s)  The format for your next appointment:   In Person  Provider:   Rozann Lesches, MD   Other Instructions Thank you for choosing [facility]!

## 2020-09-23 ENCOUNTER — Telehealth: Payer: Self-pay | Admitting: Internal Medicine

## 2020-09-23 NOTE — Telephone Encounter (Signed)
Patient needs her pantoprozole sent to [facility]

## 2020-09-24 MED ORDER — PANTOPRAZOLE SODIUM 40 MG PO TBEC: 40.0000 mg | DELAYED_RELEASE_TABLET | Freq: Two times a day (BID) | ORAL | 5 refills | Status: DC

## 2020-09-24 NOTE — Telephone Encounter (Signed)
Completed.

## 2020-09-24 NOTE — Addendum Note (Signed)
Addended by: Annitta Needs on: 09/24/2020 08:38 AM   Modules accepted: Orders

## 2020-10-01 ENCOUNTER — Telehealth: Payer: Self-pay | Admitting: Student

## 2020-10-01 DIAGNOSIS — R002 Palpitations: Secondary | ICD-10-CM | POA: Diagnosis not present

## 2020-10-01 DIAGNOSIS — I4891 Unspecified atrial fibrillation: Secondary | ICD-10-CM | POA: Diagnosis not present

## 2020-10-01 NOTE — Telephone Encounter (Signed)
Daughter [name] notified

## 2020-10-01 NOTE — Telephone Encounter (Signed)
Spoke with daughter who states that her mother's dizziness had improved after recent visit in our office. Daughter reports that her mother has started to c/o dizziness and headache over the last few days. Daughter does report that she stopped giving her mother Toprol and only gives her Atenolol. Daughter fixes her mothers medications for her. Please advise.

## 2020-10-01 NOTE — Telephone Encounter (Signed)
Patient told daughter she has been experiencing dizziness for the past few days.  Daughter seems to think her medication may be contributing to her dizziness.  No other symptoms. Would like to speak to the nurse.

## 2020-10-01 NOTE — Telephone Encounter (Signed)
   Yes, she should only be on Atenolol and she was previously intolerant to Toprol-XL and should not be on 2 beta-blockers. She is on Cardizem CD as well. She recently wore a Zio patch so we will follow-up once this has resulted. I would recommend they check her HR/BP if able when these episodes occur.   Signed, Erma Heritage, PA-C 10/01/2020, 3:03 PM

## 2020-10-08 NOTE — Telephone Encounter (Signed)
Please give pt's daughter Donella Stade a call- she's still having problems  Please call [phone]

## 2020-10-08 NOTE — Telephone Encounter (Signed)
No answer, no machine.

## 2020-10-09 ENCOUNTER — Encounter (HOSPITAL_COMMUNITY): Payer: Self-pay | Admitting: *Deleted

## 2020-10-09 ENCOUNTER — Telehealth: Payer: Self-pay | Admitting: *Deleted

## 2020-10-09 ENCOUNTER — Emergency Department (HOSPITAL_COMMUNITY)
Admission: EM | Admit: 2020-10-09 | Discharge: 2020-10-09 | Disposition: A | Discharge status: Home / Self Care | Payer: PPO | Attending: Emergency Medicine | Admitting: Emergency Medicine

## 2020-10-09 ENCOUNTER — Other Ambulatory Visit: Payer: Self-pay

## 2020-10-09 ENCOUNTER — Emergency Department (HOSPITAL_COMMUNITY): Payer: PPO

## 2020-10-09 DIAGNOSIS — W19XXXA Unspecified fall, initial encounter: Secondary | ICD-10-CM

## 2020-10-09 DIAGNOSIS — E039 Hypothyroidism, unspecified: Secondary | ICD-10-CM | POA: Insufficient documentation

## 2020-10-09 DIAGNOSIS — M25521 Pain in right elbow: Secondary | ICD-10-CM | POA: Insufficient documentation

## 2020-10-09 DIAGNOSIS — Z87891 Personal history of nicotine dependence: Secondary | ICD-10-CM | POA: Diagnosis not present

## 2020-10-09 DIAGNOSIS — W010XXA Fall on same level from slipping, tripping and stumbling without subsequent striking against object, initial encounter: Secondary | ICD-10-CM | POA: Insufficient documentation

## 2020-10-09 DIAGNOSIS — I10 Essential (primary) hypertension: Secondary | ICD-10-CM | POA: Insufficient documentation

## 2020-10-09 DIAGNOSIS — M25561 Pain in right knee: Secondary | ICD-10-CM | POA: Insufficient documentation

## 2020-10-09 DIAGNOSIS — S2241XA Multiple fractures of ribs, right side, initial encounter for closed fracture: Secondary | ICD-10-CM | POA: Insufficient documentation

## 2020-10-09 DIAGNOSIS — M25562 Pain in left knee: Secondary | ICD-10-CM | POA: Diagnosis not present

## 2020-10-09 DIAGNOSIS — S299XXA Unspecified injury of thorax, initial encounter: Secondary | ICD-10-CM | POA: Diagnosis present

## 2020-10-09 MED ORDER — DILTIAZEM HCL ER COATED BEADS 180 MG PO CP24: 180.0000 mg | ORAL_CAPSULE | Freq: Every day | ORAL | 3 refills | Status: DC

## 2020-10-09 MED ORDER — HYDROCODONE-ACETAMINOPHEN 5-325 MG PO TABS: 1.0000 | ORAL_TABLET | Freq: Four times a day (QID) | ORAL | 0 refills | Status: DC | PRN

## 2020-10-09 MED ORDER — DOCUSATE SODIUM 100 MG PO CAPS: 100.0000 mg | ORAL_CAPSULE | Freq: Two times a day (BID) | ORAL | 0 refills | Status: DC

## 2020-10-09 NOTE — ED Provider Notes (Signed)
[facility] EMERGENCY DEPARTMENT Provider Note   CSN: [id number] Arrival date & time: 10/09/20  1255     History Chief Complaint  Patient presents with   Lytle Michaels    [name] is a 75 y.o. female.  With a past medical history of atrial fibrillation, reflux, sleep apnea.  Patient states that a few days ago she turned around, lost her footing and fell "very hard" onto her bilateral knees and then hit the ground on her right side.  She complains of severe right rib pain, pain in her right elbow and pain in her bilateral knees.  She has pain with deep breathing.  She did not hit her head or lose consciousness.  She is not on any blood thinning medications.  She has been ambulatory with a cane.   Fall      Past Medical History:  Diagnosis Date   Anemia    Anxiety    Arthritis    Atrial fibrillation (HCC)    Chronic back pain    Spondylosis and stenosis   Depression    GERD (gastroesophageal reflux disease)    Headache(784.0)    Hiatal hernia    History of blood transfusion    History of GI bleed    Hypothyroidism    Nephrolithiasis    Obsessive-compulsive disorder    Recurrent upper respiratory infection (URI)    Sleep apnea     Patient Active Problem List   Diagnosis Date Noted   Spigelian hernia 08/14/2019   Constipation 08/14/2019   Right sided abdominal pain    Persistent atrial fibrillation (HCC)    Sensory disturbance 06/11/2019   Intractable nausea and vomiting 06/10/2019   Atrial fibrillation with RVR ([place]) 06/09/2019   Anastomotic ulcer    Acute anemia 04/04/2019   Anemia    Excessive use of nonsteroidal anti-inflammatory drug (NSAID)    Nausea with vomiting 03/01/2019   Diarrhea 03/01/2019   Abdominal pain, epigastric 06/08/2017   LLQ pain 06/08/2017   Odynophagia 06/08/2017   Dysphagia 06/08/2017   Copper deficiency 06/17/2015   Gastric ulceration    Hiatal hernia    Anemia, iron deficiency    Spondylolisthesis of lumbar region 01/01/2013    Palpitations 05/08/2012   Insomnia due to mental disorder 12/03/2011   Benzodiazepine causing adverse effect in therapeutic use 12/03/2011   right upper urethral calculus with obstructionh 04/08/2010   HYPOTHYROIDISM 03/26/2010   Anxiety state, unspecified 03/26/2010   H/O gastric bypass 03/26/2010   Hydronephrosis, right 01/27/2010   HEMOCCULT POSITIVE STOOL 08/11/2009   Abdominal pain 05/20/2009   BENIGN NEOPLASM OF PARATHYROID GLAND 03/19/2009   B12 deficiency 03/19/2009   HYPERCHOLESTEROLEMIA 03/19/2009   OBESITY, MORBID 03/19/2009   DEPRESSION/ANXIETY 03/19/2009   Essential hypertension 03/19/2009   GASTROESOPHAGEAL REFLUX DISEASE, CHRONIC 03/19/2009   DEGENERATIVE JOINT DISEASE 03/19/2009    Past Surgical History:  Procedure Laterality Date   ABDOMINAL HYSTERECTOMY     ANTERIOR CERVICAL DECOMP/DISCECTOMY FUSION  12/09/2010   Procedure: ANTERIOR CERVICAL DECOMPRESSION/DISCECTOMY FUSION 3 LEVELS;  Surgeon: Ophelia Charter;  Location: [place] [facility]UR[facility] ORS;  Service: Neurosurgery;  Laterality: N/A;  Cervical three-four,Cervical four-five,Cervical Five-Six,Cervical Six-Seven ANTERIOR CERVICAL DECOMPRESSION WITH FUSION INTERBODY PROTHESIS PLATING AND BONEGRAFT   APPENDECTOMY     BIOPSY N/A 11/14/2014   Procedure: GASTRIC BIOPSY;  Surgeon: Daneil Dolin, MD;  Location: [facility];  Service: Endoscopy;  Laterality: N/A;   CESAREAN SECTION     CHOLECYSTECTOMY     COLONOSCOPY WITH PROPOFOL N/A 11/14/2014  RMR: redundant colon but otherwise normal   COLONOSCOPY WITH PROPOFOL N/A 05/12/2020   Procedure: COLONOSCOPY WITH PROPOFOL;  Surgeon: Daneil Dolin, MD;  Location: [facility];  Service: Endoscopy;  Laterality: N/A;  am or pm   ESOPHAGOGASTRODUODENOSCOPY (EGD) WITH PROPOFOL N/A 11/14/2014   RMR"s/p gastric surgery/anastomtic ulcer   ESOPHAGOGASTRODUODENOSCOPY (EGD) WITH PROPOFOL N/A 09/15/2017   Procedure: ESOPHAGOGASTRODUODENOSCOPY (EGD) WITH PROPOFOL;  Surgeon: Daneil Dolin, MD;   Location: [facility];  Service: Endoscopy;  Laterality: N/A;  11:30am   ESOPHAGOGASTRODUODENOSCOPY (EGD) WITH PROPOFOL N/A 04/04/2019   2 cm ulcer crater at anastomosis with smaller satellite areas of ulceration in setting of NSAIDs/Goody powders, no bleeding stigmata.   ESOPHAGOGASTRODUODENOSCOPY (EGD) WITH PROPOFOL N/A 06/13/2019   Rourk: Prior gastric bypass surgery with Billroth II type anatomy, otherwise normal-appearing residual upper GI tract.   EYE SURGERY     bilateral cataracts removed, w/IOL   GASTRIC BYPASS  2008   JOINT REPLACEMENT     07/2010 &  2002- respectively- both knees    MALONEY DILATION N/A 09/15/2017   Procedure: MALONEY DILATION;  Surgeon: Daneil Dolin, MD;  Location: [facility];  Service: Endoscopy;  Laterality: N/A;   PARATHYROIDECTOMY     PERIPHERALLY INSERTED CENTRAL CATHETER INSERTION     PICC line for treatment of MRSA   POLYPECTOMY  05/12/2020   Procedure: POLYPECTOMY;  Surgeon: Daneil Dolin, MD;  Location: [facility];  Service: Endoscopy;;   TONSILLECTOMY     as a child     OB History   No obstetric history on file.     Family History  Problem Relation Age of Onset   Anxiety disorder Mother    Breast cancer Mother    Anxiety disorder Sister    Bipolar disorder Daughter    OCD Daughter    ADD / ADHD Daughter    Heart attack Father    Breast cancer Sister    Anesthesia problems Neg Hx    Hypotension Neg Hx    Malignant hyperthermia Neg Hx    Pseudochol deficiency Neg Hx    Dementia Neg Hx    Alcohol abuse Neg Hx    Drug abuse Neg Hx    Depression Neg Hx    Paranoid behavior Neg Hx    Schizophrenia Neg Hx    Seizures Neg Hx    Sexual abuse Neg Hx    Physical abuse Neg Hx    Colon cancer Neg Hx     Social History   Tobacco Use   Smoking status: Former    Packs/day: 1.00    Years: 30.00    Pack years: 30.00    Types: Cigarettes    Quit date: 01/18/1998    Years since quitting: 22.7   Smokeless tobacco: Never  Vaping  Use   Vaping Use: Never used  Substance Use Topics   Alcohol use: No   Drug use: No    Home Medications Prior to Admission medications   Medication Sig Start Date End Date Taking? Authorizing Provider  atenolol (TENORMIN) 50 MG tablet Take 1 tablet (50 mg total) by mouth daily. 04/15/20 09/27/20  Satira Sark, MD  Cyanocobalamin (VITAMIN B-12 IJ) Inject 0.1 mLs as directed every 30 (thirty) days.    [provider]  diltiazem (CARDIZEM CD) 180 MG 24 hr capsule Take 1 capsule (180 mg total) by mouth daily. 10/09/20 01/07/21  Strader, Fransisco Hertz, PA-C  furosemide (LASIX) 40 MG tablet Take 40  mg by mouth daily. 07/22/20   [provider]  levothyroxine (SYNTHROID, LEVOTHROID) 75 MCG tablet Take 75 mcg by mouth daily before breakfast.    [provider]  lubiprostone (AMITIZA) 24 MCG capsule TAKE 1 CAPSULE(24 MCG) BY MOUTH TWICE DAILY WITH A MEAL 07/22/20   Annitta Needs, NP  ondansetron (ZOFRAN) 4 MG tablet Take 1 tablet (4 mg total) by mouth 3 (three) times daily as needed for nausea or vomiting. Patient not taking: Reported on 09/16/2020 05/15/20   Erenest Rasher, PA-C  pantoprazole (PROTONIX) 40 MG tablet Take 1 tablet (40 mg total) by mouth 2 (two) times daily before a meal. 09/24/20   Annitta Needs, NP  potassium chloride (KLOR-CON) 10 MEQ tablet Take 10 mEq by mouth daily. 07/22/20   [provider]  promethazine (PHENERGAN) 25 MG tablet Take 25 mg by mouth every 8 (eight) hours as needed. Patient not taking: Reported on 09/16/2020 07/15/20   [provider]  triamcinolone cream (KENALOG) 0.1 % Apply 1 application topically 2 (two) times daily. Left arm rash Patient not taking: Reported on 09/16/2020 07/20/20   Hayden Rasmussen, MD  venlafaxine [facility]) 75 MG tablet Take 75 mg by mouth 2 (two) times daily.     [provider]  Vitamin D, Ergocalciferol, (DRISDOL) 50000 units CAPS capsule Take 50,000 Units by mouth every 7 (seven) days.     [provider]    Allergies    Benzodiazepines, Codeine, Escitalopram oxalate, Nsaids, Sertraline hcl, Statins, and Tramadol hcl  Review of Systems   Review of Systems  Musculoskeletal:  Positive for arthralgias and gait problem.  Skin:  Positive for wound.  Neurological:  Negative for syncope and light-headedness.   Physical Exam Updated Vital Signs BP (!) 127/96 (BP Location: Right Arm)   Pulse 60   Temp 98.3 F (36.8 C) (Oral)   Resp 18   Ht 5\' 5"  (1.651 m)   Wt 76.9 kg   SpO2 100%   BMI 28.22 kg/m   Physical Exam Vitals and nursing note reviewed.  Constitutional:      General: She is not in acute distress.    Appearance: She is well-developed. She is not diaphoretic.  HENT:     Head: Normocephalic and atraumatic.     Right Ear: External ear normal.     Left Ear: External ear normal.     Nose: Nose normal.     Mouth/Throat:     Mouth: Mucous membranes are moist.  Eyes:     General: No scleral icterus.    Conjunctiva/sclera: Conjunctivae normal.  Cardiovascular:     Rate and Rhythm: Normal rate and regular rhythm.     Heart sounds: Normal heart sounds. No murmur heard.   No friction rub. No gallop.  Pulmonary:     Effort: Pulmonary effort is normal. No respiratory distress.     Breath sounds: Normal breath sounds.  Abdominal:     General: Bowel sounds are normal. There is no distension.     Palpations: Abdomen is soft. There is no mass.     Tenderness: There is no abdominal tenderness. There is no guarding.  Musculoskeletal:     Cervical back: Normal range of motion.     Comments: Small can skin tear above the right elbow.  Tenderness to palpation of the right elbow.  Range of motion limited due to pain.  Bruising along the right axillary wall with exquisite tenderness to palpation of the rib cage, no  obvious step-offs, fractures or crepitus.  Bilateral knee tenderness, range of motion limited due to pain.  Skin:    General: Skin is warm and dry.   Neurological:     Mental Status: She is alert and oriented to person, place, and time.  Psychiatric:        Behavior: Behavior normal.    ED Results / Procedures / Treatments   Labs (all labs ordered are listed, but only abnormal results are displayed) Labs Reviewed - No data to display  EKG None  Radiology LONG TERM MONITOR (3-14 DAYS)  Result Date: 10/08/2020 ZIO XT reviewed.  7 days, 1 hour analyzed.  Atrial fibrillation is present throughout with heart rate ranging from 57 bpm up to 148 bpm and average heart rate 91 bpm.  Rare PVCs including couplets and triplets were noted representing less than 1% total beats.  There were no pauses.   Procedures Procedures   Medications Ordered in ED Medications - No data to display  ED Course  I have reviewed the triage vital signs and the nursing notes.  Pertinent labs & imaging results that were available during my care of the patient were reviewed by me and considered in my medical decision making (see chart for details).    MDM Rules/Calculators/A&P                           75 year old female who presents emergency department with mechanical fall.  She is not on any blood thinners and denies hitting her head or losing consciousness.  I ordered and reviewed multiple plain film x-rays including bilateral knees, right elbow and right rib view chest x-ray.  There are no acute findings except for 2 rib fractures on the right side.  Patient is hemodynamically stable without evidence of hypoxia or respiratory failure.  I reviewed the PDMP and will discharge the patient with Norco.  Definitive fracture care given to the patient with incentive spirometer.  Discussed outpatient follow-up and return precautions with the patient.  She appears otherwise appropriate for discharge at this time and is ambulatory with a cane. Final Clinical Impression(s) / ED Diagnoses Final diagnoses:  None    Rx / DC Orders ED Discharge Orders     None         Margarita Mail, PA-C 10/10/20 0911    Noemi Chapel, MD 10/14/20 1304

## 2020-10-09 NOTE — ED Triage Notes (Signed)
Fell 4 days ago, pain in right side of chest

## 2020-10-09 NOTE — Telephone Encounter (Signed)
Daughter notified of plan of care and voiced understanding.

## 2020-10-09 NOTE — Telephone Encounter (Signed)
-----   Message from Erma Heritage, [place] sent at 10/09/2020  9:47 AM EDT ----- Please let the patient and her daughter know her event monitor showed known atrial fibrillation and her average HR while wearing the monitor was in the 90's. Her HR did peak into the 140's at times and I am concerned this could be the cause of her symptoms. I would recommend titration of Cardizem CD from 120mg  daily to 180mg  daily (was previously on 240mg  daily and may need to go on this dose but would follow symptoms with dose adjustments). I would recommend they continue to check her HR and BP at home if able.

## 2020-10-09 NOTE — Telephone Encounter (Signed)
Spoke with daughter Engineering geologist). Pt fell on Sunday and hit her elbow and ribs. Daughter was unable to convince pt to be seen in ER until today. Pt currently en route to ER.

## 2020-10-09 NOTE — Discharge Instructions (Signed)
Contact a health care provider if: You have a fever. Get help right away if: You have difficulty breathing or you are short of breath. You develop a cough that does not stop, or you cough up thick or bloody sputum. You have nausea, vomiting, or pain in your abdomen. Your pain gets worse and medicine does not help. 

## 2020-10-13 NOTE — Telephone Encounter (Signed)
Called to notify [name] of [name]'s note. No answer. Left msg to call back.

## 2020-10-14 ENCOUNTER — Telehealth: Payer: Self-pay | Admitting: Cardiology

## 2020-10-14 NOTE — Telephone Encounter (Signed)
Patient daughter returning call to nurse about an update on her mother

## 2020-10-14 NOTE — Telephone Encounter (Signed)
Daughter said she only started increased dose of cardizem 180 mg on 9/26    They will continue to monitor BP/HR, pt now has bp machine and pulse ox and she is using he incentive spirometer she got have her ED visit for 2 rib fractures.

## 2020-10-15 DIAGNOSIS — Z9181 History of falling: Secondary | ICD-10-CM | POA: Diagnosis not present

## 2020-10-15 DIAGNOSIS — S2241XD Multiple fractures of ribs, right side, subsequent encounter for fracture with routine healing: Secondary | ICD-10-CM | POA: Diagnosis not present

## 2020-10-15 DIAGNOSIS — R531 Weakness: Secondary | ICD-10-CM | POA: Diagnosis not present

## 2020-10-15 DIAGNOSIS — M6281 Muscle weakness (generalized): Secondary | ICD-10-CM | POA: Diagnosis not present

## 2020-10-15 DIAGNOSIS — R32 Unspecified urinary incontinence: Secondary | ICD-10-CM | POA: Diagnosis not present

## 2020-10-15 NOTE — Telephone Encounter (Signed)
Called to notify [name] of [name]'s note. No answer. Left msg to call back.

## 2020-10-15 NOTE — Telephone Encounter (Signed)
Daughter notified and voiced understanding

## 2020-10-23 ENCOUNTER — Telehealth: Payer: Self-pay | Admitting: Student

## 2020-10-23 NOTE — Telephone Encounter (Signed)
Daughter states she will have mother take diltiazem at night. She did not take it this am.   Daughter also states she may have her mother see ENT again. She said she saw them a long time ago.    She was unable to get her mothers VS today as she has had a very busy day, but, she will get them.

## 2020-10-23 NOTE — Telephone Encounter (Signed)
    I agree that if her dizziness has been occurring for a long time, then it is less likely to be the Cardizem. If she would like, they can try switching to taking this in the evening hours to see if it helps with her symptoms.  Would recommend checking her heart rate and blood pressure when this occurs to make sure her vitals are stable. Her recent monitor did not show any significant bradycardia as a possible cause of her symptoms.  Signed, Erma Heritage, PA-C 10/23/2020, 4:37 PM Pager: [phone]

## 2020-10-23 NOTE — Telephone Encounter (Signed)
Daughter states her mother c/o dizziness all the time. She says 5 minutes after she takes the diltiazem she c/o dizziness. Dizziness is all day. They do not have any BP/HR readings at this time. I spoke with daughter who was going to go to patients house later today to take VS. Daughter feels patients dizziness is not from the diltiazem as she has been dizzy for a long time.

## 2020-10-23 NOTE — Telephone Encounter (Signed)
Pt's daughter called stating that the pt is complaining about the diltiazem (CARDIZEM CD) 180 MG 24 hr capsule [[id number]] making her dizzy.   Please call [name] @ [phone]

## 2020-12-03 DIAGNOSIS — F4321 Adjustment disorder with depressed mood: Secondary | ICD-10-CM | POA: Diagnosis not present

## 2020-12-03 DIAGNOSIS — Z9884 Bariatric surgery status: Secondary | ICD-10-CM | POA: Diagnosis not present

## 2020-12-03 DIAGNOSIS — K3184 Gastroparesis: Secondary | ICD-10-CM | POA: Diagnosis not present

## 2020-12-03 DIAGNOSIS — E039 Hypothyroidism, unspecified: Secondary | ICD-10-CM | POA: Diagnosis not present

## 2021-02-12 ENCOUNTER — Ambulatory Visit: Payer: PPO | Admitting: Cardiology

## 2021-02-16 ENCOUNTER — Other Ambulatory Visit: Payer: Self-pay | Admitting: Gastroenterology

## 2021-02-26 ENCOUNTER — Telehealth: Payer: Self-pay | Admitting: Internal Medicine

## 2021-02-26 MED ORDER — LACTULOSE 10 GM/15ML PO SOLN: 30.0000 g | Freq: Every day | ORAL | 1 refills | Status: AC

## 2021-02-26 NOTE — Telephone Encounter (Signed)
Patient daughter called and said patient insurance has changed and the medication that makes her go to the bathroom is now too expensive and she needs to try something cheaper

## 2021-02-26 NOTE — Telephone Encounter (Signed)
Pt's insurance is charging her double for her [place] Pierini this year. Pt is unable to afford it. Lactulose is the only thing on her formulary that is a cheaper option. Pt is ok with trying that if you think it will work for her.

## 2021-02-26 NOTE — Telephone Encounter (Signed)
I'll send in rx for lactulose. We will have to find the correct dose for her so have her let us know if she is going too much or not enough.

## 2021-02-26 NOTE — Addendum Note (Signed)
Addended by: Mahala Menghini on: 02/26/2021 12:43 PM   Modules accepted: Orders

## 2021-02-27 NOTE — Telephone Encounter (Signed)
Pt's daughter was made aware and verbalized understanding.  

## 2021-03-03 ENCOUNTER — Encounter: Payer: Self-pay | Admitting: Physician Assistant

## 2021-03-03 NOTE — Progress Notes (Signed)
Cardiology Office Note    Date:  03/16/2021   ID:  [name], [name] 01/24/1945, MRN [id number]  PCP:  Lemmie Evens, MD  Cardiologist:  Rozann Lesches, MD  Electrophysiologist:  None   Chief Complaint: f/u atrial fibrillation  History of Present Illness:   [name] is a 76 y.o. female with history of persistent atrial fibrillation (not on [place]), hypothyroidism, chronic back pain, anemia, prior gastric bypass with prior anastomotic ulcer, right spigelian hernia, GERD, anxiety, arthritis, hypothyroidism, OCD, sleep apnea, probable CKD stage 3a by labs who presents for follow-up.  She was remotely seen in 2014 for palpitations with monitor showing NSR with PACs. She was seen again in 05/2019 during admission for abdominal pain, nausea and vomiting in which she was found to be in atrial fibrillation, treated with rate control strategy. She was started on anticoagulation but this was held at discharge given upcoming spine surgery at the time. EGD 05/2019 during that admission showed no obvious ulcers but did have esophageal dilation. (Of note, previous EGD 03/2019 during admission for anemia/GIB was reported to show large anastomotic ulcer with small satellite ulcers, treated with PPI and carafate at that time.) 2D echo 05/2019 showed EF 55-60%, indeterminate diastolic parameters, mild LVH, normal RV, severe LAE, mild RAE, mild MR. The spine surgery was ultimately never pursued, and in more recent visits in 2022, anticoagulation was deferred as she was still in the midst of GI eval for iron deficiency anemia. She underwent colonoscopy in 04/2020 which showed poylps and diverticuli. She had been following with gen surgery continued vomiting given her right spigelian hernia, prior gastric bypass, and gastroparesis. At last OV 08/2020, she had intermittent palpitations so monitor was arranged to assess HR control, showing 100% AF, range 57-148bpm, average 91bpm, rare PVCs <1%. Cardizem was increased. Of  note during prior OVs it appears reviewing med rec with the patient has been a challenge to determine what exactly she has been taking though today the cardiac meds are confirmed accurate.  She returns for follow-up overall reporting she feels about the same since last visit. She continues to note episodic palpitations similar to prior, somewhat vague in their description, as if her heart is racing at times. She also complains of chronic general malaise, nausea, fatigue and overall states she just does not feel well most of the time. She notes that the ridges of her forehead feel more prominent and sometimes when she walks she'll drag her feet. No focal neurologic symptoms today. Reports chronic DOE, no chest pain. Daughter accompanied her to today's visit (her mom now lives with her). They have not observed any recent sx of GIB. She has not had to use her Lasix/KCl recently (previously took PRN edema). Remotes remote dx of OSA, not using CPAP in years.   Labwork independently reviewed: 07/2020 Hgb 16.5, MCV elevated, plt 124, K 3.6, Cr 1.25, AST/ALT OK, Tbili 1.3 04/2020 Plt wnl 07/2019 TSH wnl 05/2019 Mg wnl  Cardiology Studies:   Studies reviewed are outlined and summarized above. Reports included below if pertinent.   2D echo 05/2019   1. Left ventricular ejection fraction, by estimation, is 55 to 60%. The  left ventricle has normal function. The left ventricle has no regional  wall motion abnormalities. There is mild concentric left ventricular  hypertrophy. Left ventricular diastolic  parameters are indeterminate.   2. Right ventricular systolic function is normal. The right ventricular  size is normal. There is normal pulmonary artery systolic pressure.  3. Left atrial size was severely dilated.   4. Right atrial size was mildly dilated.   5. The mitral valve is degenerative. Mild mitral valve regurgitation.   6. The aortic valve is tricuspid. Aortic valve regurgitation is not   visualized. No aortic stenosis is present.   7. The inferior vena cava is dilated in size with >50% respiratory  variability, suggesting right atrial pressure of 8 mmHg.   Monitor Resulted 09/2020 ZIO XT reviewed.  7 days, 1 hour analyzed.  Atrial fibrillation is present throughout with heart rate ranging from 57 bpm up to 148 bpm and average heart rate 91 bpm.  Rare PVCs including couplets and triplets were noted representing less than 1% total beats.  There were no pauses.    Past Medical History:  Diagnosis Date   Anemia    Anxiety    Arthritis    Chronic back pain    Spondylosis and stenosis   Chronic kidney disease, stage 3a (HCC)    Depression    GERD (gastroesophageal reflux disease)    Headache(784.0)    Hiatal hernia    History of blood transfusion    History of GI bleed    Hypothyroidism    Mitral regurgitation    Nephrolithiasis    Obsessive-compulsive disorder    Persistent atrial fibrillation (HCC)    Recurrent upper respiratory infection (URI)    Sleep apnea     Past Surgical History:  Procedure Laterality Date   ABDOMINAL HYSTERECTOMY     ANTERIOR CERVICAL DECOMP/DISCECTOMY FUSION  12/09/2010   Procedure: ANTERIOR CERVICAL DECOMPRESSION/DISCECTOMY FUSION 3 LEVELS;  Surgeon: Ophelia Charter;  Location: [facility];  Service: Neurosurgery;  Laterality: N/A;  Cervical three-four,Cervical four-five,Cervical Five-Six,Cervical Six-Seven ANTERIOR CERVICAL DECOMPRESSION WITH FUSION INTERBODY PROTHESIS PLATING AND BONEGRAFT   APPENDECTOMY     BIOPSY N/A 11/14/2014   Procedure: GASTRIC BIOPSY;  Surgeon: Daneil Dolin, MD;  Location: [facility];  Service: Endoscopy;  Laterality: N/A;   CESAREAN SECTION     CHOLECYSTECTOMY     COLONOSCOPY WITH PROPOFOL N/A 11/14/2014   RMR: redundant colon but otherwise normal   COLONOSCOPY WITH PROPOFOL N/A 05/12/2020   Procedure: COLONOSCOPY WITH PROPOFOL;  Surgeon: Daneil Dolin, MD;  Location: [facility];  Service: Endoscopy;   Laterality: N/A;  am or pm   ESOPHAGOGASTRODUODENOSCOPY (EGD) WITH PROPOFOL N/A 11/14/2014   RMR"s/p gastric surgery/anastomtic ulcer   ESOPHAGOGASTRODUODENOSCOPY (EGD) WITH PROPOFOL N/A 09/15/2017   Procedure: ESOPHAGOGASTRODUODENOSCOPY (EGD) WITH PROPOFOL;  Surgeon: Daneil Dolin, MD;  Location: [facility];  Service: Endoscopy;  Laterality: N/A;  11:30am   ESOPHAGOGASTRODUODENOSCOPY (EGD) WITH PROPOFOL N/A 04/04/2019   2 cm ulcer crater at anastomosis with smaller satellite areas of ulceration in setting of NSAIDs/Goody powders, no bleeding stigmata.   ESOPHAGOGASTRODUODENOSCOPY (EGD) WITH PROPOFOL N/A 06/13/2019   Rourk: Prior gastric bypass surgery with Billroth II type anatomy, otherwise normal-appearing residual upper GI tract.   EYE SURGERY     bilateral cataracts removed, w/IOL   GASTRIC BYPASS  2008   JOINT REPLACEMENT     07/2010 &  2002- respectively- both knees    MALONEY DILATION N/A 09/15/2017   Procedure: MALONEY DILATION;  Surgeon: Daneil Dolin, MD;  Location: [facility];  Service: Endoscopy;  Laterality: N/A;   PARATHYROIDECTOMY     PERIPHERALLY INSERTED CENTRAL CATHETER INSERTION     PICC line for treatment of MRSA   POLYPECTOMY  05/12/2020   Procedure: POLYPECTOMY;  Surgeon: [name]  M, MD;  Location: [facility];  Service: Endoscopy;;   TONSILLECTOMY     as a child    Current Medications: Current Meds  Medication Sig   atenolol (TENORMIN) 50 MG tablet Take 1 tablet (50 mg total) by mouth daily.   diltiazem (CARDIZEM CD) 180 MG 24 hr capsule Take 1 capsule (180 mg total) by mouth daily.   docusate sodium (COLACE) 100 MG capsule Take 1 capsule (100 mg total) by mouth 2 (two) times daily.   furosemide (LASIX) 40 MG tablet Take 40 mg by mouth as needed.   levothyroxine (SYNTHROID, LEVOTHROID) 75 MCG tablet Take 75 mcg by mouth daily before breakfast.   pantoprazole (PROTONIX) 40 MG tablet Take 1 tablet (40 mg total) by mouth 2 (two) times daily before a  meal.   potassium chloride (KLOR-CON) 10 MEQ tablet Take 10 mEq by mouth as needed.   venlafaxine (EFFEXOR) 75 MG tablet Take 75 mg by mouth 2 (two) times daily.    Vitamin D, Ergocalciferol, (DRISDOL) 50000 units CAPS capsule Take 50,000 Units by mouth every 7 (seven) days.      Allergies:   Benzodiazepines, Codeine, Escitalopram oxalate, Nsaids, Sertraline hcl, Statins, and Tramadol hcl   Social History   Socioeconomic History   Marital status: Widowed    Spouse name: Not on file   Number of children: 4   Years of education: Not on file   Highest education level: Not on file  Occupational History   Not on file  Tobacco Use   Smoking status: Former    Packs/day: 1.00    Years: 30.00    Pack years: 30.00    Types: Cigarettes    Quit date: 01/18/1998    Years since quitting: 23.1   Smokeless tobacco: Never  Vaping Use   Vaping Use: Never used  Substance and Sexual Activity   Alcohol use: No   Drug use: No   Sexual activity: Not on file  Other Topics Concern   Not on file  Social History Narrative   Not on file   Social Determinants of Health   Financial Resource Strain: Not on file  Food Insecurity: Not on file  Transportation Needs: Not on file  Physical Activity: Not on file  Stress: Not on file  Social Connections: Not on file     Family History:  The patient's family history includes ADD / ADHD in her daughter; Anxiety disorder in her mother and sister; Bipolar disorder in her daughter; Breast cancer in her mother and sister; Heart attack in her father; OCD in her daughter. There is no history of Anesthesia problems, Hypotension, Malignant hyperthermia, Pseudochol deficiency, Dementia, Alcohol abuse, Drug abuse, Depression, Paranoid behavior, Schizophrenia, Seizures, Sexual abuse, Physical abuse, or Colon cancer.  ROS:   Please see the history of present illness.  All other systems are reviewed and otherwise negative.    EKG(s)/Additional Labs   EKG:  EKG  is ordered today, personally reviewed, demonstrating atrial fibrillation 88bpm, left axis deviation, nonspecific TW changes similar to prior  Recent Labs: 07/20/2020: ALT 27; BUN 31; Creatinine, Ser 1.25; Hemoglobin 16.5; Platelets 124; Potassium 3.6; Sodium 136  Recent Lipid Panel No results found for: CHOL, TRIG, HDL, CHOLHDL, VLDL, LDLCALC, LDLDIRECT  PHYSICAL EXAM:    VS:  BP 118/70    Pulse 84    Ht 5\' 5"  (1.651 m)    Wt 160 lb (72.6 kg)    SpO2 95%    BMI 26.63 kg/m  BMI: Body mass index is 26.63 kg/m.  GEN: Well nourished, well developed female in no acute distress HEENT: normocephalic, atraumatic Neck: no JVD, carotid bruits, or masses Cardiac: irregularly irregular, no murmurs, rubs, or gallops, no edema  Respiratory:  clear to auscultation bilaterally, normal work of breathing GI: soft, nontender, nondistended, + BS MS: no deformity or atrophy Skin: warm and dry, no rash Neuro:  Alert and Oriented x 3, Strength and sensation are intact, follows commands Psych: euthymic mood, full affect  Wt Readings from Last 3 Encounters:  03/16/21 160 lb (72.6 kg)  10/09/20 169 lb 9 oz (76.9 kg)  09/16/20 171 lb (77.6 kg)     ASSESSMENT & PLAN:   1. Persistent atrial fibrillation - HR high 80s today on exam. She still feels episodic palpitations. Will try increasing diltiazem to 240mg  daily. Continue atenolol at present dose. I am not confident this will make a big impact on her overall sense of wellbeing as some of her symptoms are likely noncardiac. Hopefully we can help with the palpitations. Regarding anticoagulation, there was previously a consideration of referral for [facility] if she was felt to be able to tolerate blood thinners for the short term. It is not clear to me that this has been declared. I will await her bloodwork and plan to review with Dr. Domenic Polite whether she would be considered a candidate.  2. History of anemia - update CBC with labs. Daughter needs to leave and  they will return to have these drawn. Historically has seen Neil Crouch / Dr. Gala Romney @ [facility] - last OV 02/2020.  3. Hypothyroidism - recheck thyroid with labs.  4. CKD stage 3a by prior labs - recheck BMET with labs.  5. Mild mitral regurgitation by echo 2021 - no overt murmur on exam or HF symptoms. Consider repeat echocardiogram in 2024-2026 or as clinically indicated.  6. OSA - with daytime fatigue and atrial fib, discussed updating sleep study - she wishes to pursue.    Disposition: F/u with Dr. McDowell/[facility] in 3 months. Also encouraged PCP f/u of other generalized symptoms.   Medication Adjustments/Labs and Tests Ordered: Current medicines are reviewed at length with the patient today.  Concerns regarding medicines are outlined above. Medication changes, Labs and Tests ordered today are summarized above and listed in the Patient Instructions accessible in Encounters.    Signed, Charlie Pitter, PA-C  03/16/2021 2:58 PM    [facility] [place] Location in [place],  [address] Ph: [phone]; Fax [phone]

## 2021-03-16 ENCOUNTER — Ambulatory Visit (INDEPENDENT_AMBULATORY_CARE_PROVIDER_SITE_OTHER): Payer: PPO | Admitting: Physician Assistant

## 2021-03-16 ENCOUNTER — Other Ambulatory Visit: Payer: Self-pay

## 2021-03-16 ENCOUNTER — Encounter: Payer: Self-pay | Admitting: Physician Assistant

## 2021-03-16 VITALS — BP 118/70 | HR 84 | Ht 65.0 in | Wt 160.0 lb

## 2021-03-16 DIAGNOSIS — N1831 Chronic kidney disease, stage 3a: Secondary | ICD-10-CM

## 2021-03-16 DIAGNOSIS — I340 Nonrheumatic mitral (valve) insufficiency: Secondary | ICD-10-CM

## 2021-03-16 DIAGNOSIS — I4819 Other persistent atrial fibrillation: Secondary | ICD-10-CM | POA: Diagnosis not present

## 2021-03-16 DIAGNOSIS — G4733 Obstructive sleep apnea (adult) (pediatric): Secondary | ICD-10-CM

## 2021-03-16 DIAGNOSIS — Z862 Personal history of diseases of the blood and blood-forming organs and certain disorders involving the immune mechanism: Secondary | ICD-10-CM

## 2021-03-16 DIAGNOSIS — E039 Hypothyroidism, unspecified: Secondary | ICD-10-CM | POA: Diagnosis not present

## 2021-03-16 MED ORDER — DILTIAZEM HCL ER COATED BEADS 240 MG PO CP24: 240.0000 mg | ORAL_CAPSULE | Freq: Every day | ORAL | 3 refills | Status: DC

## 2021-03-16 NOTE — Patient Instructions (Signed)
Medication Instructions:   Increase Diltiazem to 240 mg Daily   *If you need a refill on your cardiac medications before your next appointment, please call your pharmacy*   Lab Work: Your physician recommends that you return for lab work.   If you have labs (blood work) drawn today and your tests are completely normal, you will receive your results only by: [place] (if you have MyChart) OR A paper copy in the mail If you have any lab test that is abnormal or we need to change your treatment, we will call you to review the results.   Testing/Procedures: Your physician has recommended that you have a sleep study. This test records several body functions during sleep, including: brain activity, eye movement, oxygen and carbon dioxide blood levels, heart rate and rhythm, breathing rate and rhythm, the flow of air through your mouth and nose, snoring, body muscle movements, and chest and belly movement.    Follow-Up: At [facility], you and your health needs are our priority.  As part of our continuing mission to provide you with exceptional heart care, we have created designated Provider Care Teams.  These Care Teams include your primary Cardiologist (physician) and Advanced Practice Providers (APPs -  Physician Assistants and Nurse Practitioners) who all work together to provide you with the care you need, when you need it.  We recommend signing up for the patient portal called "MyChart".  Sign up information is provided on this After Visit Summary.  MyChart is used to connect with patients for Virtual Visits (Telemedicine).  Patients are able to view lab/test results, encounter notes, upcoming appointments, etc.  Non-urgent messages can be sent to your provider as well.   To learn more about what you can do with MyChart, go to [email or web address].    Your next appointment:   3 month(s)  The format for your next appointment:   In Person  Provider:   You may see Rozann Lesches, MD or one of the following Advanced Practice Providers on your designated Care Team:   Bernerd Pho, PA-C  Ermalinda Barrios, PA-C     Other Instructions Thank you for choosing [facility]!

## 2021-03-18 ENCOUNTER — Telehealth: Payer: Self-pay | Admitting: *Deleted

## 2021-03-18 NOTE — Telephone Encounter (Signed)
Called pt to notify of the need to have labs done. No answer, left msg to call back.  ?

## 2021-03-18 NOTE — Telephone Encounter (Signed)
Spoke with daughter who states that she has to work today and Architectural [employer]. She will try and have lab work done on Friday or Monday.  ?

## 2021-03-20 LAB — COMPREHENSIVE METABOLIC PANEL
ALT: 9 IU/L (ref 0–32)
AST: 15 IU/L (ref 0–40)
Albumin/Globulin Ratio: 2 (ref 1.2–2.2)
Albumin: 4.1 g/dL (ref 3.7–4.7)
Alkaline Phosphatase: 82 IU/L (ref 44–121)
BUN/Creatinine Ratio: 27 (ref 12–28)
BUN: 30 mg/dL — ABNORMAL HIGH (ref 8–27)
Bilirubin Total: 0.5 mg/dL (ref 0.0–1.2)
CO2: 21 mmol/L (ref 20–29)
Calcium: 9.4 mg/dL (ref 8.7–10.3)
Chloride: 102 mmol/L (ref 96–106)
Creatinine, Ser: 1.11 mg/dL — ABNORMAL HIGH (ref 0.57–1.00)
Globulin, Total: 2.1 g/dL (ref 1.5–4.5)
Glucose: 172 mg/dL — ABNORMAL HIGH (ref 70–99)
Potassium: 4.5 mmol/L (ref 3.5–5.2)
Sodium: 139 mmol/L (ref 134–144)
Total Protein: 6.2 g/dL (ref 6.0–8.5)
eGFR: 52 mL/min/{1.73_m2} — ABNORMAL LOW (ref >59)

## 2021-03-20 LAB — CBC
Hematocrit: 40 % (ref 34.0–46.6)
Hemoglobin: 13.7 g/dL (ref 11.1–15.9)
MCH: 33.6 pg — ABNORMAL HIGH (ref 26.6–33.0)
MCHC: 34.3 g/dL (ref 31.5–35.7)
MCV: 98 fL — ABNORMAL HIGH (ref 79–97)
Platelets: 177 10*3/uL (ref 150–450)
RBC: 4.08 x10E6/uL (ref 3.77–5.28)
RDW: 10.9 % — ABNORMAL LOW (ref 11.7–15.4)
WBC: 6.4 10*3/uL (ref 3.4–10.8)

## 2021-03-20 LAB — TSH: TSH: 3.52 u[IU]/mL (ref 0.450–4.500)

## 2021-03-27 ENCOUNTER — Encounter: Payer: Self-pay | Admitting: *Deleted

## 2021-04-01 ENCOUNTER — Telehealth: Payer: Self-pay | Admitting: Physician Assistant

## 2021-04-01 NOTE — Telephone Encounter (Signed)
Complicated case, please schedule follow up with Dr. Gala Romney regarding GI clearance.  ?

## 2021-04-01 NOTE — Telephone Encounter (Signed)
Returned call to daughter. No answer. Left msg to call back.  

## 2021-04-01 NOTE — Telephone Encounter (Signed)
Follow Up: ? ? ? ?Daughter is calling back, could not talk earlier when she was called. ?

## 2021-04-01 NOTE — Telephone Encounter (Signed)
Spoke with daughter Donella Stade who states that she can not talk at this time and will call office back.  ?

## 2021-04-01 NOTE — Telephone Encounter (Addendum)
? ?  Please let pt know I reviewed her chart with Dr. Domenic Polite about whether we need to consider going forward with EP referral to [facility] procedure, which is a surgical procedure that helps to reduce risk of stroke in atrial fib patients who cannot be on long term full anticoagulation. However, if we were to pursue this she would need to be cleared from GI to resume blood thinners. ([facility] patients typically have to be on Eliquis + ASA until the 45 day mark, then dual antiplatelet therapy until 6 months, then aspirin alone.) Dr. Domenic Polite recommends she see GI back first before referring. We recommended she do this in a prior result note, please make sure to reiterate this recommendation. She is due for GI follow-up anyway. I will cc to Neil Crouch (who pt identified as her primary GI provider) as well as Dr. Gala Romney only as FYI for when they see her in clinic follow-up. Alda Berthold - she also appears to have 2 appts scheduled with [place] in June with B. Ahmed Prima, please consolidate to only one. Thanks! ?

## 2021-04-02 NOTE — Telephone Encounter (Signed)
Spoke to daughter and verbalized plan per Melina Copa, PA-C. Daughter voiced understanding and had no questions regarding plan.  ?

## 2021-04-02 NOTE — Telephone Encounter (Signed)
?  Pt's daughter is returning call ?

## 2021-04-06 ENCOUNTER — Telehealth: Payer: Self-pay | Admitting: Student

## 2021-04-06 NOTE — Telephone Encounter (Signed)
[name] is a patient of Dr.Rourk already,she does not need a referral. Staff message was sent to Neil Crouch on 04/01/21 already. ? ? ?I spoke with Dr.Rourk's office and they will reach out to daughter to schedule follow up. ?

## 2021-04-06 NOTE — Telephone Encounter (Signed)
Daughter of patient called. The patient needs a referral to see the GI Doctor and a detailed reason as to why the appt is needed.  ? ?The patient would still like to see Dr. Gala Romney  ?

## 2021-04-06 NOTE — Telephone Encounter (Signed)
[name] is calling from Dr. Roseanne [name] office requesting to speak with [name] in regards to what this patient is needing to be seen for. She states the daughter did not know. Please advise.  ?

## 2021-04-17 ENCOUNTER — Other Ambulatory Visit: Payer: Self-pay | Admitting: Cardiology

## 2021-05-04 NOTE — Progress Notes (Addendum)
? ? ?Referring Provider: Lemmie Evens, MD ?Primary Care Physician:  Lemmie Evens, MD ?Primary GI Physician: Dr. Gala Romney ? ?Chief Complaint  ?Patient presents with  ? Medication Consultation  ?  Acid reflux   ? ? ?HPI:   ?[name] is a 76 y.o. female presenting today for follow-up of abdominal pain, nausea/vomiting, constipation, GERD. ? ?She has a history of gastric bypass in 2007, IDA, anastomotic ulcer in 2016, history of right lateral abdominal wall hernia, history of small right-sided spigelian hernia without bowel involvement (not reported but reviewed with radiology and seen on March 2021 CT as well as previous studies), inpatient admission March 2021 with abdominal pain status post EGD with 2 cm ulcer crater at the anastomosis with smaller satellite areas of ulceration in the setting of NSAID/Goody powders.  Patient evaluated by Dr. Constance Haw back in March 2021 and she did not feel there was any indication for surgery.Presented to the hospital again in May 2021 complaining of migratory abdominal pain predominantly in the lower abdomen.  Pain worse from her baseline, aggravated by movement and associated with vomiting.  Continued to complain of bulging in the right mid abdomen associated with pain and known small spigelian hernia seen on previous imaging studies.  Repeat CT scan in May with no appreciable abdominal wall hernia on that exam, no evidence of internal hernia.  Repeat EGD showed prior gastric bypass surgery with Billroth II type anatomy. Otherwise normal-appearing residual upper GI tract.  Nothing to explain her pain.  She was advised outpatient evaluation with hematology for chronic IDA and need for iron infusions (which she did not follow through with) as well as to follow-up with the bariatric surgeon for ongoing abdominal pain and vomiting. ? ?She was last seen in our office 03/04/2020.  She had moved in with her daughter 4 months prior and was trying to get her healthcare back on track.   She had lost 40 pounds, but attributed this to eating healthy at her daughter's house.  She continued with daily vomiting that would come on out of the blue taking Zofran 3 times daily.  She continued with right-sided abdominal pain at the site of right mid abdominal bulge.  Also stated she had a new bulge in the right lower abdomen.  Bowels alternating between constipation and diarrhea, but more on the constipated side going 1-2 weeks without a bowel movement.  She was not taking Amitiza regularly.  Also with a lot of heartburn.  Plan to obtain most recent labs from PCP, may need to consider CT and possible colonoscopy to further evaluate abdominal pain and IDA, take Amitiza 24 mcg twice daily on a regular basis, continue PPI twice daily.  ? ?Labs were received from 2020 and 2021.  Hemoglobin was 11.8 in February 2020, down to 9 in April 2021.  October 2021, ferritin 6, iron 22, iron saturation 5%.  Recommended updating CBC and iron panel and proceeding with colonoscopy. ? ?Labs 05/08/2020: Hemoglobin 14.0, ferritin 138, saturation elevated at 32%, iron 92. ? ?Colonoscopy 05/12/2020: seven 4-7 mm polyps removed (tubular adenomas), diverticulosis in the sigmoid colon.  Recommended repeat colonoscopy in 3 years.  Dr. Gala Romney also recommended patient see bariatric surgeon for consideration of repair of right sided spigelian hernia seen on CT which likely explains her right-sided abdominal pain. ? ?Patient called 06/12/2020- referred to [facility] surgery.  ? ?Patient saw Dr. Thermon Leyland with [facility] surgery in July 2022 who stated he was not sure that the spigelian hernia  was her main issue.  She complained of right-sided as well as epigastric abdominal pain.  Noted she had history of vitamin, mineral deficiencies, marginal ulceration, anasarca on prior CT, and intractable nausea and vomiting.  Recommended checking nutritional parameters and for patient see a dietitian to reestablish care and restart  bariatric multivitamins.  Plan to follow-up in 2 weeks.  Stated they could consider repairing the incisional hernia, but first need to complete nutritional evaluation and ensure that there were no other issues causing abdominal pain. ? ?Nutritional labs were ok.  Iron and hemoglobin within normal meds.  TSH elevated at 11.4 and recommended follow-up with PCP. ?[name] called to state patient's insurance would only pay for nutritional counseling if she was referred by PCP. They planned to follow through with this and follow-up at a later date.  ? ?Patient called 02/26/2021 stating Amitiza was now too expensive.  Lactulose was the only thing on her formulary and she was sent in a prescription for 45 mL daily. ? ? ? ?Today:  ?Patient presents with her daughter today stating they need GI clearance to resume blood thinner in order to be considered for a Watchman procedure.  Daughter reports patient hasn't been on a blood thinner in 1.5 years.  She denies having any bright red blood per rectum or melena.  She had a single episode of bright red blood in toilet water after urinating in September, but no recurrent symptoms.  She does report frequent dysuria. ? ? ?GERD:  ?Heartburn frequently. On Protonix BID.  Daughter reports patient eats and lays down, eats tomatoes and spaghetti and meatballs frequently. Drinks carbonated beverages daily. No dysphagia.  ? ?Nausea/Vomiting:  ?Continues with nausea/vomiting daily. Vomiting white foam most of the time, food sometimes. No longer taking Zofran or phenergan.  Daughter states nothing helps her.  Cannot member the last time she took Zofran and cannot remember if she took it scheduled. Reports she has issues with constantly moving her mouth. Worried about phenergan or other meds that may worsen this.  ? ?Reports eating 3 meals a day and snacking between. Daughter states she eats quite bit. Primarily eating soups and sandwiches.  ?Currently residing with her grandson.   ?Documented 26 lb weight loss over the last 9 months.  ? ?States insurance wouldn't cover seeing a dietician.  ? ?Constipation:  ?Reports she still has Amitiza leftover and has been taking as needed, about every 3 days.  Bowels move every other day, but are incomplete and has to strain.  States she did not know Amitiza was supposed to be taken every day.  She has not started lactulose and plan to start that when she ran out of Amitiza. ? ? ?Anemia:  ?Last received feraheme per PCP in March 2022. Most recent hemoglobin March 2023 within normal limits at 13.7 with macrocytic indices. On B12 injection monthly.  ? ?Right sided abdominal pain/spigelian hernia:  ?Chronic. Continues with right-sided abdominal pain Reports it is all along the right side.  Some left-sided abdominal pain as well at times, but the right side is most severe.  This is the same pain she is been dealing with for quite some time without any significant change.  At first reports it can be worsened by meals, but then later states its not affected by meals.  Some improvement when bowels are moving well. ? ? ? ?After I had left the exam room, daughter requested to talk to me individually.  She reports mother has history of self  inducing vomiting.  She has not seen her doing this recently, but wonders if this may be playing a role in some of her vomiting and weight loss.  States her mom is very fixated on her weight.   ? ? ?Past Medical History:  ?Diagnosis Date  ? Anemia   ? Anxiety   ? Arthritis   ? Chronic back pain   ? Spondylosis and stenosis  ? Chronic kidney disease, stage 3a ([place])   ? Depression   ? GERD (gastroesophageal reflux disease)   ? Headache(784.0)   ? Hiatal hernia   ? History of blood transfusion   ? History of GI bleed   ? Hypothyroidism   ? Mitral regurgitation   ? Nephrolithiasis   ? Obsessive-compulsive disorder   ? Persistent atrial fibrillation ([place])   ? Recurrent upper respiratory infection (URI)   ? Sleep apnea   ? ? ?Past  Surgical History:  ?Procedure Laterality Date  ? ABDOMINAL HYSTERECTOMY    ? ANTERIOR CERVICAL DECOMP/DISCECTOMY FUSION  12/09/2010  ? Procedure: ANTERIOR CERVICAL DECOMPRESSION/DISCECTOMY FUSION 3 LEVELS;  Surg

## 2021-05-06 ENCOUNTER — Encounter: Payer: Self-pay | Admitting: *Deleted

## 2021-05-06 ENCOUNTER — Encounter: Payer: Self-pay | Admitting: Gastroenterology

## 2021-05-06 ENCOUNTER — Ambulatory Visit (INDEPENDENT_AMBULATORY_CARE_PROVIDER_SITE_OTHER): Payer: PPO | Admitting: Gastroenterology

## 2021-05-06 VITALS — BP 142/74 | HR 84 | Temp 97.6°F | Ht 64.0 in | Wt 162.6 lb

## 2021-05-06 DIAGNOSIS — K219 Gastro-esophageal reflux disease without esophagitis: Secondary | ICD-10-CM

## 2021-05-06 DIAGNOSIS — K439 Ventral hernia without obstruction or gangrene: Secondary | ICD-10-CM

## 2021-05-06 DIAGNOSIS — R300 Dysuria: Secondary | ICD-10-CM | POA: Diagnosis not present

## 2021-05-06 DIAGNOSIS — R1084 Generalized abdominal pain: Secondary | ICD-10-CM | POA: Diagnosis not present

## 2021-05-06 DIAGNOSIS — D509 Iron deficiency anemia, unspecified: Secondary | ICD-10-CM

## 2021-05-06 DIAGNOSIS — R634 Abnormal weight loss: Secondary | ICD-10-CM | POA: Insufficient documentation

## 2021-05-06 DIAGNOSIS — K5900 Constipation, unspecified: Secondary | ICD-10-CM | POA: Diagnosis not present

## 2021-05-06 DIAGNOSIS — I4891 Unspecified atrial fibrillation: Secondary | ICD-10-CM

## 2021-05-06 DIAGNOSIS — R112 Nausea with vomiting, unspecified: Secondary | ICD-10-CM | POA: Diagnosis not present

## 2021-05-06 MED ORDER — ONDANSETRON HCL 4 MG PO TABS: 4.0000 mg | ORAL_TABLET | Freq: Three times a day (TID) | ORAL | 1 refills | Status: DC | PRN

## 2021-05-06 MED ORDER — LANSOPRAZOLE 30 MG PO CPDR: 30.0000 mg | DELAYED_RELEASE_CAPSULE | Freq: Two times a day (BID) | ORAL | 3 refills | Status: DC

## 2021-05-06 NOTE — Patient Instructions (Addendum)
Please have blood work completed at [facility] ? ?We will arrange for a CT scan of her abdomen pelvis in the near future at [facility]. ? ?We will also arrange for you to have a study done to evaluate how fast her stomach is emptying anything hospital. ? ?For reflux: ?Stop Protonix and start Prevacid 30 mg twice daily 30 minutes before breakfast and dinner. ?Follow a GERD diet/lifestyle:  ?Avoid fried, fatty, greasy, spicy, citrus foods. ?Avoid caffeine and carbonated beverages. ?Avoid chocolate. ?Try eating 4-6 small meals a day rather than 3 large meals. ?Do not eat within 3 hours of laying down. ?Prop head of bed up on wood or bricks to create a 6 inch incline. ? ?For your ongoing nausea/vomiting: ?I have sent in a prescription for Zofran.  You can try taking 1 every morning to see if this will help prevent some of your nausea.  You may take it up to every 8 hours as needed. ? ?For constipation: ?You may continue to take your Amitiza until you run out.  As we discussed, you should take this every day.  You can try taking once daily every day with a meal.  If this is not strong enough, take twice a day every day with a meal. ?After you finish your Amitiza, start lactulose and let me know if you have any further problems with constipation. ? ?We we will call you with your results and further recommendations. ? ?It was good to meet you today! ? ?Aliene Altes, PA-C ?[facility] ? ? ? ? ? ?

## 2021-05-07 ENCOUNTER — Telehealth: Payer: Self-pay | Admitting: Gastroenterology

## 2021-05-07 NOTE — Telephone Encounter (Signed)
[name], please let patient/daughter know I have reviewed her case with Dr. Gala Romney. He agrees that it would be ok for her to resume anticoagulation though there is no way to prevent a future GI bleed. She will need to be sure that she avoids all NSAID products aside from aspirin if needed per cardiology. This includes ibuprofen, Aleve, Advil, naproxen, meloxicam, BC powders, Goody powders, and anything that says "NSAID" on the package.  Dr. Gala Romney also recommended evaluating for H. pylori (bacteria in the stomach).  She has not been evaluated for this since 2011, and we should update this. ? ?In order to rule out H. pylori, we need to complete an H. pylori breath test.  However, to do this, she has to be off PPI (previously Protonix, now Prevacid as of yesterday) for 14 days.  During this timeframe, she also advised to avoid antibiotics, over-the-counter acid suppression medications, and Pepto-Bismol as they will interfere with the accuracy of the test.  During this 14-day period, she can take Pepcid 20 mg as needed 1 or 2 times a day, but cannot take it within 24 hours of completing the H. pylori test. ? ?If she is unable to tolerate being off her acid reflux medications/other medications mentioned above during this 14-day period, she needs to let me know. ? ? ? ?Routing to Dr. Domenic Polite as well as Juluis Rainier regarding resuming anticoagulation for consideration of watchman. ?

## 2021-05-07 NOTE — Telephone Encounter (Signed)
Left message for daughter,[name], to return my call. ?

## 2021-05-08 ENCOUNTER — Telehealth: Payer: Self-pay | Admitting: Cardiology

## 2021-05-08 ENCOUNTER — Other Ambulatory Visit: Payer: Self-pay | Admitting: *Deleted

## 2021-05-08 ENCOUNTER — Encounter: Payer: Self-pay | Admitting: *Deleted

## 2021-05-08 DIAGNOSIS — R112 Nausea with vomiting, unspecified: Secondary | ICD-10-CM

## 2021-05-08 DIAGNOSIS — R1084 Generalized abdominal pain: Secondary | ICD-10-CM

## 2021-05-08 NOTE — Telephone Encounter (Signed)
Pt's daughter stated that she would be unable to bring pt to her appt with provider on 4/26 d/t working and being the only caregiver for pt. Pt would like to know if we can move appt. Will send message to provider as pt was added to providers hospital schedule.  ? ?Please advise.  ?

## 2021-05-08 NOTE — Telephone Encounter (Signed)
I left message for daughter again. I asked her to call us to confirm apt that we made for 4/26 in [place] to se Dr.McDowell. ?

## 2021-05-08 NOTE — Telephone Encounter (Signed)
Spoke to daughter who voiced that she can bring pt to appt with Dr. Domenic Polite on 4/24.  ?

## 2021-05-08 NOTE — Telephone Encounter (Signed)
Spoke to pt daughter [name] (DPR) informed her of recommendations. Daughter voiced understanding. Informed her that it was recommended pt have an updated H.pylori test done. I informed daughter that I would send instructions on what not to take during the 14 day period before the breath test.  ?

## 2021-05-08 NOTE — Telephone Encounter (Signed)
[name] called and made mention that she is the only one to take patient to appt and she have to work on 05/13/21. Wants Dr. Domenic Polite or nurse to call back to discuss appt ?

## 2021-05-08 NOTE — Telephone Encounter (Signed)
Noted  

## 2021-05-11 ENCOUNTER — Encounter (HOSPITAL_COMMUNITY): Admission: RE | Admit: 2021-05-11 | Payer: PPO | Source: Ambulatory Visit

## 2021-05-11 ENCOUNTER — Telehealth: Payer: Self-pay | Admitting: *Deleted

## 2021-05-11 ENCOUNTER — Encounter (HOSPITAL_COMMUNITY): Payer: PPO

## 2021-05-11 ENCOUNTER — Encounter (HOSPITAL_COMMUNITY): Payer: Self-pay

## 2021-05-11 NOTE — Telephone Encounter (Signed)
Received call from patient daughter. Unable to due the GES today. Daughter received a call from [place] stating "the medication they were going to give patient did not smell good" so they are going to be rescheduled pt. FYI to [place] ?

## 2021-05-11 NOTE — Telephone Encounter (Signed)
Noted  

## 2021-05-13 ENCOUNTER — Encounter: Payer: Self-pay | Admitting: Cardiology

## 2021-05-13 ENCOUNTER — Ambulatory Visit (HOSPITAL_COMMUNITY)
Admission: RE | Admit: 2021-05-13 | Discharge: 2021-05-13 | Disposition: A | Discharge status: Home / Self Care | Payer: PPO | Source: Ambulatory Visit | Attending: Gastroenterology | Admitting: Gastroenterology

## 2021-05-13 ENCOUNTER — Encounter: Payer: PPO | Admitting: Cardiology

## 2021-05-13 VITALS — BP 128/82 | HR 64 | Ht 65.0 in | Wt 162.0 lb

## 2021-05-13 DIAGNOSIS — K439 Ventral hernia without obstruction or gangrene: Secondary | ICD-10-CM | POA: Diagnosis present

## 2021-05-13 DIAGNOSIS — R1084 Generalized abdominal pain: Secondary | ICD-10-CM | POA: Diagnosis not present

## 2021-05-13 LAB — COMPLETE METABOLIC PANEL WITH GFR
AG Ratio: 1.8 (calc) (ref 1.0–2.5)
ALT: 8 U/L (ref 6–29)
AST: 13 U/L (ref 10–35)
Albumin: 4.2 g/dL (ref 3.6–5.1)
Alkaline phosphatase (APISO): 63 U/L (ref 37–153)
BUN/Creatinine Ratio: 23 (calc) — ABNORMAL HIGH (ref 6–22)
BUN: 30 mg/dL — ABNORMAL HIGH (ref 7–25)
CO2: 25 mmol/L (ref 20–32)
Calcium: 9.9 mg/dL (ref 8.6–10.4)
Chloride: 104 mmol/L (ref 98–110)
Creat: 1.3 mg/dL — ABNORMAL HIGH (ref 0.60–1.00)
Globulin: 2.4 g/dL (calc) (ref 1.9–3.7)
Glucose, Bld: 98 mg/dL (ref 65–99)
Potassium: 4.3 mmol/L (ref 3.5–5.3)
Sodium: 139 mmol/L (ref 135–146)
Total Bilirubin: 0.6 mg/dL (ref 0.2–1.2)
Total Protein: 6.6 g/dL (ref 6.1–8.1)
eGFR: 43 mL/min/{1.73_m2} — ABNORMAL LOW (ref >=60)

## 2021-05-13 LAB — URINALYSIS, ROUTINE W REFLEX MICROSCOPIC
Bacteria, UA: NONE SEEN /HPF
Bilirubin Urine: NEGATIVE
Glucose, UA: NEGATIVE
Hgb urine dipstick: NEGATIVE
Hyaline Cast: NONE SEEN /LPF
Ketones, ur: NEGATIVE
Nitrite: NEGATIVE
Protein, ur: NEGATIVE
Specific Gravity, Urine: 1.013 (ref 1.001–1.035)
Squamous Epithelial / HPF: NONE SEEN /HPF (ref <=5)
pH: 5.5 (ref 5.0–8.0)

## 2021-05-13 LAB — CBC WITH DIFFERENTIAL/PLATELET
Absolute Monocytes: 488 cells/uL (ref 200–950)
Basophils Absolute: 52 cells/uL (ref 0–200)
Basophils Relative: 0.8 %
Eosinophils Absolute: 59 cells/uL (ref 15–500)
Eosinophils Relative: 0.9 %
HCT: 41.8 % (ref 35.0–45.0)
Hemoglobin: 14.4 g/dL (ref 11.7–15.5)
Lymphs Abs: 1931 cells/uL (ref 850–3900)
MCH: 33.6 pg — ABNORMAL HIGH (ref 27.0–33.0)
MCHC: 34.4 g/dL (ref 32.0–36.0)
MCV: 97.7 fL (ref 80.0–100.0)
MPV: 11.9 fL (ref 7.5–12.5)
Monocytes Relative: 7.5 %
Neutro Abs: 3972 cells/uL (ref 1500–7800)
Neutrophils Relative %: 61.1 %
Platelets: 213 10*3/uL (ref 140–400)
RBC: 4.28 10*6/uL (ref 3.80–5.10)
RDW: 10.9 % — ABNORMAL LOW (ref 11.0–15.0)
Total Lymphocyte: 29.7 %
WBC: 6.5 10*3/uL (ref 3.8–10.8)

## 2021-05-13 LAB — B12 AND FOLATE PANEL
Folate: 13.6 ng/mL
Vitamin B-12: 260 pg/mL (ref 200–1100)

## 2021-05-13 LAB — MICROSCOPIC MESSAGE

## 2021-05-13 LAB — LIPASE: Lipase: 21 U/L (ref 7–60)

## 2021-05-13 MED ORDER — IOHEXOL 300 MG/ML  SOLN
100.0000 mL | Freq: Once | INTRAMUSCULAR | Status: AC | PRN
  Administered 2021-05-13: 80 mL via INTRAVENOUS

## 2021-05-13 NOTE — Progress Notes (Signed)
This encounter was created in error - please disregard.

## 2021-05-18 ENCOUNTER — Telehealth: Payer: Self-pay | Admitting: Internal Medicine

## 2021-05-18 NOTE — Telephone Encounter (Signed)
Lmom for pt to return my call.  

## 2021-05-18 NOTE — Telephone Encounter (Signed)
See lab results note.

## 2021-05-18 NOTE — Telephone Encounter (Signed)
Patient daughter returned call ?

## 2021-05-18 NOTE — Telephone Encounter (Signed)
Returning call. [phone] ?

## 2021-05-22 ENCOUNTER — Ambulatory Visit: Payer: PPO | Admitting: Student

## 2021-05-25 ENCOUNTER — Encounter: Payer: Self-pay | Admitting: Cardiology

## 2021-05-25 ENCOUNTER — Ambulatory Visit (INDEPENDENT_AMBULATORY_CARE_PROVIDER_SITE_OTHER): Payer: PPO | Admitting: Cardiology

## 2021-05-25 ENCOUNTER — Encounter (HOSPITAL_COMMUNITY)
Admission: RE | Admit: 2021-05-25 | Discharge: 2021-05-25 | Disposition: A | Discharge status: Home / Self Care | Payer: PPO | Source: Ambulatory Visit | Attending: Gastroenterology | Admitting: Gastroenterology

## 2021-05-25 VITALS — BP 130/85 | HR 54 | Ht 65.0 in | Wt 165.8 lb

## 2021-05-25 DIAGNOSIS — R112 Nausea with vomiting, unspecified: Secondary | ICD-10-CM | POA: Diagnosis present

## 2021-05-25 DIAGNOSIS — I4819 Other persistent atrial fibrillation: Secondary | ICD-10-CM | POA: Diagnosis not present

## 2021-05-25 DIAGNOSIS — I10 Essential (primary) hypertension: Secondary | ICD-10-CM

## 2021-05-25 MED ORDER — TECHNETIUM TC 99M SULFUR COLLOID
2.0000 | Freq: Once | INTRAVENOUS | Status: AC | PRN
  Administered 2021-05-25: 1.95 via INTRAVENOUS

## 2021-05-25 NOTE — Patient Instructions (Signed)
Medication Instructions:  ?Your physician recommends that you continue on your current medications as directed. Please refer to the Current Medication list given to you today. ? ? ?Labwork: ?None today ? ?Testing/Procedures: ?None today ? ?Follow-Up: ?3 months ? ?Any Other Special Instructions Will Be Listed Below (If Applicable). ? ? ? ?You have been referred to [facility] at the [facility] office for discussion of Watchman Device. The office will call you to schedule appointment. ? ? ?If you need a refill on your cardiac medications before your next appointment, please call your pharmacy. ? ?

## 2021-05-25 NOTE — Progress Notes (Signed)
? ? ?Cardiology Office Note ? ?Date: 05/25/2021  ? ?ID: [name], DOB April 20, 1945, MRN [id number] ? ?PCP:  Lemmie Evens, MD  ?Cardiologist:  Rozann Lesches, MD ?Electrophysiologist:  None  ? ?Chief Complaint  ?Patient presents with  ? Cardiac follow-up  ? ? ?History of Present Illness: ?[name] is a 76 y.o. female last seen in February by Ms. Dunn PA-C, I reviewed her note.  She is here today with her daughter for a follow-up visit. ? ?She was seen by gastroenterology in April, I reviewed the notes and further recommendations from Ms. Trellis Moment and Dr. Gala Romney.  She has a history of peptic ulcer disease and prior anemia with GI bleeding, but felt to be a reasonable candidate for temporary anticoagulation in the setting of Watchman implantation.  Her history includes previous gastric bypass in 2007 with anastomotic ulcer noted 2016 and more recently healed in March 2021.  She states that she was told that she cannot take long-term antiplatelet medications, presumably due to bleeding risk, although I am not certain that this is an absolute based on her recent gastroenterology notes. ? ?Cardizem CD was increased to 240 mg daily at last visit for better heart rate control. CHA2DS2-VASc score is 4. ? ?Today we discussed referral for a Watchman device discussion, mainly to see if she would be a candidate at all. ? ?Past Medical History:  ?Diagnosis Date  ? Anemia   ? Anxiety   ? Arthritis   ? Chronic back pain   ? Spondylosis and stenosis  ? Chronic kidney disease, stage 3a ([place])   ? Depression   ? GERD (gastroesophageal reflux disease)   ? Headache(784.0)   ? Hiatal hernia   ? History of blood transfusion   ? History of GI bleed   ? Hypothyroidism   ? Mitral regurgitation   ? Nephrolithiasis   ? Obsessive-compulsive disorder   ? Persistent atrial fibrillation ([place])   ? Recurrent upper respiratory infection (URI)   ? Sleep apnea   ? ? ?Past Surgical History:  ?Procedure Laterality Date  ? ABDOMINAL HYSTERECTOMY     ? ANTERIOR CERVICAL DECOMP/DISCECTOMY FUSION  12/09/2010  ? Procedure: ANTERIOR CERVICAL DECOMPRESSION/DISCECTOMY FUSION 3 LEVELS;  Surgeon: Ophelia Charter;  Location: [facility];  Service: Neurosurgery;  Laterality: N/A;  Cervical three-four,Cervical four-five,Cervical Five-Six,Cervical Six-Seven ANTERIOR CERVICAL DECOMPRESSION WITH FUSION INTERBODY PROTHESIS PLATING AND BONEGRAFT  ? APPENDECTOMY    ? BIOPSY N/A 11/14/2014  ? Procedure: GASTRIC BIOPSY;  Surgeon: Daneil Dolin, MD;  Location: [facility];  Service: Endoscopy;  Laterality: N/A;  ? CESAREAN SECTION    ? CHOLECYSTECTOMY    ? COLONOSCOPY WITH PROPOFOL N/A 11/14/2014  ? RMR: redundant colon but otherwise normal  ? COLONOSCOPY WITH PROPOFOL N/A 05/12/2020  ? Dr. Gala Romney;  Seven 4 to 7 mm polyps, diverticulosis in the sigmoid colon. Pathology with tubular adenomas. Recommended repeat in 3 years.  ? ESOPHAGOGASTRODUODENOSCOPY (EGD) WITH PROPOFOL N/A 11/14/2014  ? RMR"s/p gastric surgery/anastomtic ulcer  ? ESOPHAGOGASTRODUODENOSCOPY (EGD) WITH PROPOFOL N/A 09/15/2017  ? Procedure: ESOPHAGOGASTRODUODENOSCOPY (EGD) WITH PROPOFOL;  Surgeon: Daneil Dolin, MD;  Location: [facility];  Service: Endoscopy;  Laterality: N/A;  11:30am  ? ESOPHAGOGASTRODUODENOSCOPY (EGD) WITH PROPOFOL N/A 04/04/2019  ? 2 cm ulcer crater at anastomosis with smaller satellite areas of ulceration in setting of NSAIDs/Goody powders, no bleeding stigmata.  ? ESOPHAGOGASTRODUODENOSCOPY (EGD) WITH PROPOFOL N/A 06/13/2019  ? [place]: Prior gastric bypass surgery with Billroth II type anatomy, otherwise  normal-appearing residual upper GI tract.  ? EYE SURGERY    ? bilateral cataracts removed, w/IOL  ? GASTRIC BYPASS  2008  ? JOINT REPLACEMENT    ? 07/2010 &  2002- respectively- both knees   ? MALONEY DILATION N/A 09/15/2017  ? Procedure: MALONEY DILATION;  Surgeon: Daneil Dolin, MD;  Location: [facility];  Service: Endoscopy;  Laterality: N/A;  ? PARATHYROIDECTOMY    ? PERIPHERALLY  INSERTED CENTRAL CATHETER INSERTION    ? PICC line for treatment of MRSA  ? POLYPECTOMY  05/12/2020  ? Procedure: POLYPECTOMY;  Surgeon: Daneil Dolin, MD;  Location: [facility];  Service: Endoscopy;;  ? TONSILLECTOMY    ? as a child  ? ? ?Current Outpatient Medications  ?Medication Sig Dispense Refill  ? atenolol (TENORMIN) 50 MG tablet TAKE 1 TABLET(50 MG) BY MOUTH DAILY 90 tablet 3  ? Cyanocobalamin (VITAMIN B-12 IJ) Inject 0.1 mLs as directed every 30 (thirty) days.    ? diltiazem (CARDIZEM CD) 240 MG 24 hr capsule Take 1 capsule (240 mg total) by mouth daily. 90 capsule 3  ? furosemide (LASIX) 40 MG tablet Take 40 mg by mouth daily as needed.    ? lansoprazole (PREVACID) 30 MG capsule Take 1 capsule (30 mg total) by mouth 2 (two) times daily before a meal. 60 capsule 3  ? levothyroxine (SYNTHROID, LEVOTHROID) 75 MCG tablet Take 75 mcg by mouth daily before breakfast.    ? lubiprostone (AMITIZA) 24 MCG capsule TAKE 1 CAPSULE(24 MCG) BY MOUTH TWICE DAILY WITH A MEAL 60 capsule 3  ? oxybutynin (DITROPAN-XL) 5 MG 24 hr tablet Take 5 mg by mouth daily.    ? venlafaxine (EFFEXOR) 75 MG tablet Take 75 mg by mouth 2 (two) times daily.     ? Vitamin D, Ergocalciferol, (DRISDOL) 50000 units CAPS capsule Take 50,000 Units by mouth every 7 (seven) days.    ? ondansetron (ZOFRAN) 4 MG tablet Take 1 tablet (4 mg total) by mouth every 8 (eight) hours as needed for nausea or vomiting. (Patient not taking: Reported on 05/13/2021) 30 tablet 1  ? ?No current facility-administered medications for this visit.  ? ?Allergies:  Benzodiazepines, Codeine, Escitalopram oxalate, Nsaids, Sertraline hcl, Statins, and Tramadol hcl  ? ?Social History: The patient  reports that she quit smoking about 23 years ago. Her smoking use included cigarettes. She has a 30.00 pack-year smoking history. She has never used smokeless tobacco. She reports that she does not drink alcohol and does not use drugs.  ? ?Family History: The patient's family  history includes ADD / ADHD in her daughter; Anxiety disorder in her mother and sister; Bipolar disorder in her daughter; Breast cancer in her mother and sister; Heart attack in her father; OCD in her daughter.  ? ?ROS: No orthopnea or PND. ? ?Physical Exam: ?VS:  BP 130/85   Pulse (!) 54   Ht '5\' 5"'$  (1.651 m)   Wt 165 lb 12.8 oz (75.2 kg)   SpO2 97%   BMI 27.59 kg/m? , BMI Body mass index is 27.59 kg/m?. ? ?Wt Readings from Last 3 Encounters:  ?05/25/21 165 lb 12.8 oz (75.2 kg)  ?05/13/21 162 lb (73.5 kg)  ?05/06/21 162 lb 9.6 oz (73.8 kg)  ?  ?General: Patient appears comfortable at rest. ?HEENT: Conjunctiva and lids normal, oropharynx clear. ?Neck: Supple, no elevated JVP or carotid bruits, no thyromegaly. ?Lungs: Clear to auscultation, nonlabored breathing at rest. ?Cardiac: Irregular irregular, no S3, 1/6 systolic murmur,  no pericardial rub. ?Abdomen: Soft, nontender, bowel sounds present. ?Extremities: Mild ankle edema, distal pulses 2+. ?Skin: Warm and dry. ?Musculoskeletal: No kyphosis. ?Neuropsychiatric: Alert and oriented x3, affect grossly appropriate. ? ?ECG:  An ECG dated 03/16/2021 was personally reviewed today and demonstrated:  Atrial fibrillation with nonspecific ST-T changes. ? ?Recent Labwork: ?03/19/2021: TSH 3.520 ?05/12/2021: ALT 8; AST 13; BUN 30; Creat 1.30; Hemoglobin 14.4; Platelets 213; Potassium 4.3; Sodium 139  ? ?Other Studies Reviewed Today: ? ?Echocardiogram 06/10/2019: ? 1. Left ventricular ejection fraction, by estimation, is 55 to 60%. The  ?left ventricle has normal function. The left ventricle has no regional  ?wall motion abnormalities. There is mild concentric left ventricular  ?hypertrophy. Left ventricular diastolic  ?parameters are indeterminate.  ? 2. Right ventricular systolic function is normal. The right ventricular  ?size is normal. There is normal pulmonary artery systolic pressure.  ? 3. Left atrial size was severely dilated.  ? 4. Right atrial size was mildly dilated.   ? 5. The mitral valve is degenerative. Mild mitral valve regurgitation.  ? 6. The aortic valve is tricuspid. Aortic valve regurgitation is not  ?visualized. No aortic stenosis is present.  ? 7. The inferior

## 2021-05-26 ENCOUNTER — Other Ambulatory Visit: Payer: Self-pay | Admitting: Gastroenterology

## 2021-05-26 DIAGNOSIS — K219 Gastro-esophageal reflux disease without esophagitis: Secondary | ICD-10-CM

## 2021-05-26 MED ORDER — LANSOPRAZOLE 30 MG PO CPDR: 30.0000 mg | DELAYED_RELEASE_CAPSULE | Freq: Two times a day (BID) | ORAL | 3 refills | Status: DC

## 2021-05-27 ENCOUNTER — Other Ambulatory Visit (HOSPITAL_COMMUNITY): Payer: PPO

## 2021-05-27 ENCOUNTER — Encounter: Payer: Self-pay | Admitting: Internal Medicine

## 2021-06-12 ENCOUNTER — Encounter: Payer: Self-pay | Admitting: Cardiovascular Disease

## 2021-06-12 ENCOUNTER — Ambulatory Visit (INDEPENDENT_AMBULATORY_CARE_PROVIDER_SITE_OTHER): Payer: PPO | Admitting: Cardiovascular Disease

## 2021-06-12 VITALS — BP 120/70 | HR 88 | Ht 65.0 in | Wt 159.2 lb

## 2021-06-12 DIAGNOSIS — I4819 Other persistent atrial fibrillation: Secondary | ICD-10-CM

## 2021-06-12 DIAGNOSIS — Z01810 Encounter for preprocedural cardiovascular examination: Secondary | ICD-10-CM

## 2021-06-12 MED ORDER — METOPROLOL TARTRATE 25 MG PO TABS: ORAL_TABLET | ORAL | 0 refills | Status: DC

## 2021-06-12 NOTE — Progress Notes (Signed)
Watchman Consult Note   Date:  06/12/2021   ID:  Kendra, [name] 1945/04/28, MRN [id number]  PCP:  Lemmie Evens, MD  Cardiologist:  Dr Domenic Polite Primary Electrophysiologist: none Referring Physician: Dr Domenic Polite   CC: to discuss Watchman implant    History of Present Illness: [name] is a 76 y.o. female referred by Dr Domenic Polite for evaluation of atrial fibrillation and stroke prevention. She has persistent atrial fibrillation.  The patient has been evaluated by their referring physician and is felt to be a poor candidate for long term [place] due to GI bleeding and iron deficiency anemia.  She therefore presents today for Watchman evaluation.   The patient has a history of gastric bypass in 2007.  She was noted to have an anastomotic ulcer in 2016.  She has had hospitalizations for abdominal pain in 2021 and was found to have an ulcer at that time.  Colonoscopy in 2022 demonstrated multiple polyps and diverticulosis with a repeat colonoscopy recommended at a 3-year interval.  She has had iron deficiency anemia and last received Feraheme in 2022.  The patient has struggled with chronic nausea and vomiting and abdominal pain.  Her iron deficiency anemia is felt to be related to malabsorption.  She has not had overt gastrointestinal bleeding.  She was seen by GI in April 2023 and felt to be a reasonable candidate for short-term anticoagulation, but there was a plan for further discussion with Dr. Gala Romney documented.  Per documentation he has reviewed her case and cleared her to use short-term anticoagulation but it is important for her to understand there is no way to prevent a future GI bleed and it will be important for her to avoid all nonsteroidal anti-inflammatory products.  Today, she denies symptoms of palpitations, chest pain, orthopnea, PND, lower extremity edema, claudication, dizziness, presyncope, syncope, bleeding, or neurologic sequela.  The patient does admit to exertional dyspnea.   She lives with her daughter who accompanies her today.  The patient has become pretty sedentary over the past few years.   Past Medical History:  Diagnosis Date   Anemia    Anxiety    Arthritis    Chronic back pain    Spondylosis and stenosis   Chronic kidney disease, stage 3a (HCC)    Depression    GERD (gastroesophageal reflux disease)    Headache(784.0)    Hiatal hernia    History of blood transfusion    History of GI bleed    Hypothyroidism    Mitral regurgitation    Nephrolithiasis    Obsessive-compulsive disorder    Persistent atrial fibrillation (HCC)    Recurrent upper respiratory infection (URI)    Sleep apnea    Past Surgical History:  Procedure Laterality Date   ABDOMINAL HYSTERECTOMY     ANTERIOR CERVICAL DECOMP/DISCECTOMY FUSION  12/09/2010   Procedure: ANTERIOR CERVICAL DECOMPRESSION/DISCECTOMY FUSION 3 LEVELS;  Surgeon: Ophelia Charter;  Location: [facility];  Service: Neurosurgery;  Laterality: N/A;  Cervical three-four,Cervical four-five,Cervical Five-Six,Cervical Six-Seven ANTERIOR CERVICAL DECOMPRESSION WITH FUSION INTERBODY PROTHESIS PLATING AND BONEGRAFT   APPENDECTOMY     BIOPSY N/A 11/14/2014   Procedure: GASTRIC BIOPSY;  Surgeon: Daneil Dolin, MD;  Location: [facility];  Service: Endoscopy;  Laterality: N/A;   CESAREAN SECTION     CHOLECYSTECTOMY     COLONOSCOPY WITH PROPOFOL N/A 11/14/2014   RMR: redundant colon but otherwise normal   COLONOSCOPY WITH PROPOFOL N/A 05/12/2020   Dr. Gala Romney;  Seven 4 to  7 mm polyps, diverticulosis in the sigmoid colon. Pathology with tubular adenomas. Recommended repeat in 3 years.   ESOPHAGOGASTRODUODENOSCOPY (EGD) WITH PROPOFOL N/A 11/14/2014   RMR"s/p gastric surgery/anastomtic ulcer   ESOPHAGOGASTRODUODENOSCOPY (EGD) WITH PROPOFOL N/A 09/15/2017   Procedure: ESOPHAGOGASTRODUODENOSCOPY (EGD) WITH PROPOFOL;  Surgeon: Daneil Dolin, MD;  Location: [facility];  Service: Endoscopy;  Laterality: N/A;  11:30am    ESOPHAGOGASTRODUODENOSCOPY (EGD) WITH PROPOFOL N/A 04/04/2019   2 cm ulcer crater at anastomosis with smaller satellite areas of ulceration in setting of NSAIDs/Goody powders, no bleeding stigmata.   ESOPHAGOGASTRODUODENOSCOPY (EGD) WITH PROPOFOL N/A 06/13/2019   Rourk: Prior gastric bypass surgery with Billroth II type anatomy, otherwise normal-appearing residual upper GI tract.   EYE SURGERY     bilateral cataracts removed, w/IOL   GASTRIC BYPASS  2008   JOINT REPLACEMENT     07/2010 &  2002- respectively- both knees    MALONEY DILATION N/A 09/15/2017   Procedure: Venia Minks DILATION;  Surgeon: Daneil Dolin, MD;  Location: [facility];  Service: Endoscopy;  Laterality: N/A;   PARATHYROIDECTOMY     PERIPHERALLY INSERTED CENTRAL CATHETER INSERTION     PICC line for treatment of MRSA   POLYPECTOMY  05/12/2020   Procedure: POLYPECTOMY;  Surgeon: Daneil Dolin, MD;  Location: [facility];  Service: Endoscopy;;   TONSILLECTOMY     as a child     Current Outpatient Medications  Medication Sig Dispense Refill   atenolol (TENORMIN) 50 MG tablet TAKE 1 TABLET(50 MG) BY MOUTH DAILY 90 tablet 3   Cyanocobalamin (VITAMIN B-12 IJ) Inject 0.1 mLs as directed every 30 (thirty) days.     diltiazem (CARDIZEM CD) 240 MG 24 hr capsule Take 1 capsule (240 mg total) by mouth daily. 90 capsule 3   levothyroxine (SYNTHROID, LEVOTHROID) 75 MCG tablet Take 75 mcg by mouth daily before breakfast.     lubiprostone (AMITIZA) 24 MCG capsule TAKE 1 CAPSULE(24 MCG) BY MOUTH TWICE DAILY WITH A MEAL 60 capsule 3   metoprolol tartrate (LOPRESSOR) 25 MG tablet Take 1 tablet by mouth 2 hours prior to scan, take second tablet with you to test 2 tablet 0   oxybutynin (DITROPAN-XL) 5 MG 24 hr tablet Take 5 mg by mouth daily.     pantoprazole (PROTONIX) 40 MG tablet Take 40 mg by mouth 2 (two) times daily.     venlafaxine (EFFEXOR) 75 MG tablet Take 75 mg by mouth 2 (two) times daily.      Vitamin D, Ergocalciferol,  (DRISDOL) 50000 units CAPS capsule Take 50,000 Units by mouth every 7 (seven) days.     furosemide (LASIX) 40 MG tablet Take 40 mg by mouth daily as needed. (Patient not taking: Reported on 06/12/2021)     lansoprazole (PREVACID) 30 MG capsule Take 1 capsule (30 mg total) by mouth 2 (two) times daily before a meal. (Patient not taking: Reported on 06/12/2021) 60 capsule 3   ondansetron (ZOFRAN) 4 MG tablet Take 1 tablet (4 mg total) by mouth every 8 (eight) hours as needed for nausea or vomiting. (Patient not taking: Reported on 06/12/2021) 30 tablet 1   No current facility-administered medications for this visit.    Allergies:   Benzodiazepines, Codeine, Escitalopram oxalate, Nsaids, Sertraline hcl, Statins, and Tramadol hcl   Social History:  The patient  reports that she quit smoking about 23 years ago. Her smoking use included cigarettes. She has a 30.00 pack-year smoking history. She has never used smokeless tobacco. She  reports that she does not drink alcohol and does not use drugs.   Family History:  The patient's  family history includes ADD / ADHD in her daughter; Anxiety disorder in her mother and sister; Bipolar disorder in her daughter; Breast cancer in her mother and sister; Heart attack in her father; OCD in her daughter.    ROS:  Please see the history of present illness.   All other systems are reviewed and negative.    PHYSICAL EXAM: VS:  BP 120/70   Pulse 88   Ht '5\' 5"'$  (1.651 m)   Wt 159 lb 3.2 oz (72.2 kg)   SpO2 95%   BMI 26.49 kg/m  , BMI Body mass index is 26.49 kg/m. GEN: Well nourished, well developed, in no acute distress  HEENT: normal  Neck: no JVD, carotid bruits, or masses Cardiac: irregularly irregular; no murmurs, rubs, or gallops,no edema  Respiratory:  clear to auscultation bilaterally, normal work of breathing GI: soft, nontender, nondistended, + BS MS: no deformity or atrophy  Skin: warm and dry  Neuro:  Strength and sensation are intact Psych:  euthymic mood, full affect  EKG:  EKG is ordered today. The ekg ordered today shows atrial fibrillation with premature aberrantly conducted complexes, heart rate 88 bpm, low voltage QRS.  Recent Labs: 03/19/2021: TSH 3.520 05/12/2021: ALT 8; BUN 30; Creat 1.30; Hemoglobin 14.4; Platelets 213; Potassium 4.3; Sodium 139    Lipid Panel  No results found for: CHOL, TRIG, HDL, CHOLHDL, VLDL, LDLCALC, LDLDIRECT   Wt Readings from Last 3 Encounters:  06/12/21 159 lb 3.2 oz (72.2 kg)  05/25/21 165 lb 12.8 oz (75.2 kg)  05/13/21 162 lb (73.5 kg)      Other studies Reviewed: Additional studies/ records that were reviewed today include:  2d Echo 06/10/2019: 1. Left ventricular ejection fraction, by estimation, is 55 to 60%. The  left ventricle has normal function. The left ventricle has no regional  wall motion abnormalities. There is mild concentric left ventricular  hypertrophy. Left ventricular diastolic  parameters are indeterminate.   2. Right ventricular systolic function is normal. The right ventricular  size is normal. There is normal pulmonary artery systolic pressure.   3. Left atrial size was severely dilated.   4. Right atrial size was mildly dilated.   5. The mitral valve is degenerative. Mild mitral valve regurgitation.   6. The aortic valve is tricuspid. Aortic valve regurgitation is not  visualized. No aortic stenosis is present.   7. The inferior vena cava is dilated in size with >50% respiratory  variability, suggesting right atrial pressure of 8 mmHg.      ASSESSMENT AND PLAN:  1.  Persistent atrial fibrillation I have seen DANESE DORSAINVIL is a 76 y.o. female in the office today who has been referred for a Watchman left atrial appendage closure device.  She has a history of persistent atrial fibrillation.  Risk scores are outlined below:  CHA2DS2-VASc Score = 4   This indicates a 4.8% annual risk of stroke. The patient's score is based upon: CHF History: 0 HTN  History: 1 Diabetes History: 0 Stroke History: 0 Vascular Disease History: 1 Age Score: 1 Gender Score: 1      HAS-BLED score  Hypertension No  Abnormal renal and liver function (Dialysis, transplant, Cr >2.26 mg/dL /Cirrhosis or Bilirubin >2x Normal or AST/ALT/AP >3x Normal) No  Stroke No  Bleeding Yes  Labile INR (Unstable/high INR) No  Elderly (>65) Yes  Drugs or alcohol (?  8 drinks/week, anti-plt or NSAID) No   Unfortunately, She is not felt to be a long term anticoagulation candidate secondary to iron deficiency anemia, prior gastric bypass with anastomotic ulceration.  The patients chart has been reviewed and I along with their referring cardiologist feel that they would be a candidate for short term oral anticoagulation.  She has also been cleared by GI for short-term anticoagulation. Procedural risks for the Watchman implant have been reviewed with the patient including a <1% risk of stroke, <1% risk of perforation or pericardial effusion, 0.1% risk of device embolization.  Given the patient's poor candidacy for long-term oral anticoagulation, left atrial appendage occlusion is a reasonable treatment consideration.  I reviewed available data with the patient and her daughter today.  She is not a candidate for long-term anticoagulation for reasons outlined above.  Her left atrial appendage anatomy will be assessed with a CTA of the heart.  Our team will contact the patient once her CTA is completed and we will review with her whether her cardiac anatomy is suitable for left atrial appendage occlusion.  In the interim, she will consider whether she wants to move forward with the procedure.  She does have some of memory impairment/cognitive decline and she will talk things over further with her daughter before proceeding.  Once these are performed I will review to confirm the patient continues to be a suitable candidate for Watchman implant. If so, we will schedule for the implant procedure at  the first available date.  Current medicines are reviewed at length with the patient today.   The patient does not have concerns regarding her medicines.  The following changes were made today:  none  Labs/ tests ordered today include:  Orders Placed This Encounter  Procedures   CT CARDIAC MORPH/PULM VEIN W/CM&W/O CA SCORE   Basic metabolic panel   EKG [id number]     Signed, [name], MD 06/12/2021  4:59 PM     [address] [place] [address] [phone] (office) [phone] (fax)

## 2021-06-12 NOTE — Patient Instructions (Addendum)
Medication Instructions:  Your physician recommends that you continue on your current medications as directed. Please refer to the Current Medication list given to you today.  *If you need a refill on your cardiac medications before your next appointment, please call your pharmacy*   Lab Work: BMET today If you have labs (blood work) drawn today and your tests are completely normal, you will receive your results only by: [place] (if you have MyChart) OR A paper copy in the mail If you have any lab test that is abnormal or we need to change your treatment, we will call you to review the results.   Testing/Procedures: Watchman CT Your physician has requested that you have cardiac CT. Cardiac computed tomography (CT) is a painless test that uses an x-ray machine to take clear, detailed pictures of your heart. For further information please visit [email or web address]. Please follow instruction sheet as given.     Follow-Up: At [facility], you and your health needs are our priority.  As part of our continuing mission to provide you with exceptional heart care, we have created designated Provider Care Teams.  These Care Teams include your primary Cardiologist (physician) and Advanced Practice Providers (APPs -  Physician Assistants and Nurse Practitioners) who all work together to provide you with the care you need, when you need it.  We recommend signing up for the patient portal called "MyChart".  Sign up information is provided on this After Visit Summary.  MyChart is used to connect with patients for Virtual Visits (Telemedicine).  Patients are able to view lab/test results, encounter notes, upcoming appointments, etc.  Non-urgent messages can be sent to your provider as well.   To learn more about what you can do with MyChart, go to [email or web address].    Your next appointment:   Structural team will follow-up  Provider:   [name], MD  WATCHMAN CT  INSTRUCTIONS: Your WATCHMAN CT will be scheduled at [place] will be called to confirm date and time.  The day of your CT appointment, please enter [facility] through the [employer] [facility] Entrance (Entrance C off [facility] You may use the FREE valet parking offered at entrance C (encouraged to control the heart rate for the test). Then proceed to the [facility] Radiology Department (first floor) for check-in and test prep 30 minutes prior to your scheduled appointment.   Please follow these instructions carefully:  On the Night Before the Test: Be sure to Drink plenty of water. Do not consume any caffeinated/decaffeinated beverages or chocolate 12 hours prior to your test. Do not take any antihistamines 12 hours prior to your test. On the Day of the Test: Drink plenty of water until 1 hour prior to the test. Do not eat any food 4 hours prior to the test. You may take your regular medications prior to the test.  Take metoprolol (Lopressor) two hours prior to test  HOLD Furosemide/Hydrochlorothiazide morning of the test. FEMALES- please wear underwire-free bra if available      After the Test: Drink plenty of water. After receiving IV contrast, you may experience a mild flushed feeling. This is normal. On occasion, you may experience a mild rash up to 24 hours after the test. This is not dangerous. If this occurs, you can take Benadryl 25 mg and increase your fluid intake. If you experience trouble breathing, this can be serious. If it is severe call 911 IMMEDIATELY. If it is mild, please call our office. If  you take any of these medications: Glipizide/Metformin, Avandament, Glucavance, please do not take 48 hours after completing test unless otherwise instructed.  Once we have confirmed authorization from your insurance company, you will be called to set up a date and time for your test.   For non-scheduling related questions/concerns about your CT scan, please  contact the cardiac imaging nurses: Marchia Bond, Cardiac Imaging Nurse Navigator Gordy Clement, Cardiac Imaging Nurse [facility] Direct Office Dial: [phone]   For scheduling needs, including cancellations and rescheduling, please call [place], [phone].  Lenice Llamas, the Watchman Nurse Navigator, will call you after your CT once the [facility] Team has reviewed your imaging for an update on proceedings. [place]'s direct number is [phone] if you need assistance.   Important Information About Sugar

## 2021-06-13 LAB — BASIC METABOLIC PANEL
BUN/Creatinine Ratio: 17 (ref 12–28)
BUN: 20 mg/dL (ref 8–27)
CO2: 23 mmol/L (ref 20–29)
Calcium: 9.8 mg/dL (ref 8.7–10.3)
Chloride: 102 mmol/L (ref 96–106)
Creatinine, Ser: 1.18 mg/dL — ABNORMAL HIGH (ref 0.57–1.00)
Glucose: 112 mg/dL — ABNORMAL HIGH (ref 70–99)
Potassium: 5.1 mmol/L (ref 3.5–5.2)
Sodium: 139 mmol/L (ref 134–144)
eGFR: 48 mL/min/{1.73_m2} — ABNORMAL LOW (ref >59)

## 2021-06-19 ENCOUNTER — Ambulatory Visit: Payer: PPO | Admitting: Student

## 2021-06-26 ENCOUNTER — Ambulatory Visit: Payer: PPO | Admitting: Student

## 2021-06-30 ENCOUNTER — Ambulatory Visit: Payer: PPO | Admitting: Internal Medicine

## 2021-07-06 ENCOUNTER — Telehealth: Payer: Self-pay | Admitting: Internal Medicine

## 2021-07-06 NOTE — Telephone Encounter (Signed)
Please call patient about her prescription .  She wants to go back to the one she took prior

## 2021-07-07 NOTE — Telephone Encounter (Signed)
error 

## 2021-07-07 NOTE — Telephone Encounter (Signed)
LMOM for pt to call office back 

## 2021-07-09 ENCOUNTER — Telehealth (HOSPITAL_COMMUNITY): Payer: Self-pay | Admitting: *Deleted

## 2021-07-09 NOTE — Telephone Encounter (Signed)
Attempted to call patient regarding upcoming cardiac CT appointment. °Left message on voicemail with name and callback number ° °Teagyn Fishel RN Navigator Cardiac Imaging °[facility] °[phone] Office °[phone] Cell ° °

## 2021-07-10 ENCOUNTER — Ambulatory Visit (HOSPITAL_COMMUNITY): Admission: RE | Admit: 2021-07-10 | Payer: PPO | Source: Ambulatory Visit

## 2021-07-12 ENCOUNTER — Emergency Department (HOSPITAL_COMMUNITY)
Admission: EM | Admit: 2021-07-12 | Discharge: 2021-07-12 | Disposition: A | Discharge status: Home / Self Care | Payer: PPO | Attending: Emergency Medicine | Admitting: Emergency Medicine

## 2021-07-12 ENCOUNTER — Emergency Department (HOSPITAL_COMMUNITY): Payer: PPO

## 2021-07-12 ENCOUNTER — Other Ambulatory Visit: Payer: Self-pay

## 2021-07-12 ENCOUNTER — Encounter (HOSPITAL_COMMUNITY): Payer: Self-pay | Admitting: Emergency Medicine

## 2021-07-12 DIAGNOSIS — K279 Peptic ulcer, site unspecified, unspecified as acute or chronic, without hemorrhage or perforation: Secondary | ICD-10-CM | POA: Diagnosis not present

## 2021-07-12 DIAGNOSIS — Z79899 Other long term (current) drug therapy: Secondary | ICD-10-CM | POA: Diagnosis not present

## 2021-07-12 DIAGNOSIS — I4891 Unspecified atrial fibrillation: Secondary | ICD-10-CM | POA: Insufficient documentation

## 2021-07-12 DIAGNOSIS — E039 Hypothyroidism, unspecified: Secondary | ICD-10-CM | POA: Insufficient documentation

## 2021-07-12 DIAGNOSIS — I10 Essential (primary) hypertension: Secondary | ICD-10-CM | POA: Insufficient documentation

## 2021-07-12 DIAGNOSIS — R109 Unspecified abdominal pain: Secondary | ICD-10-CM | POA: Diagnosis present

## 2021-07-12 LAB — URINALYSIS, ROUTINE W REFLEX MICROSCOPIC
Bacteria, UA: NONE SEEN
Bilirubin Urine: NEGATIVE
Glucose, UA: NEGATIVE mg/dL
Hgb urine dipstick: NEGATIVE
Ketones, ur: NEGATIVE mg/dL
Nitrite: NEGATIVE
Protein, ur: NEGATIVE mg/dL
Specific Gravity, Urine: 1.02 (ref 1.005–1.030)
pH: 6 (ref 5.0–8.0)

## 2021-07-12 LAB — CBC
HCT: 40.8 % (ref 36.0–46.0)
Hemoglobin: 13 g/dL (ref 12.0–15.0)
MCH: 32.5 pg (ref 26.0–34.0)
MCHC: 31.9 g/dL (ref 30.0–36.0)
MCV: 102 fL — ABNORMAL HIGH (ref 80.0–100.0)
Platelets: 157 10*3/uL (ref 150–400)
RBC: 4 MIL/uL (ref 3.87–5.11)
RDW: 13.2 % (ref 11.5–15.5)
WBC: 9.5 10*3/uL (ref 4.0–10.5)
nRBC: 0 % (ref 0.0–0.2)

## 2021-07-12 LAB — COMPREHENSIVE METABOLIC PANEL
ALT: 13 U/L (ref 0–44)
AST: 17 U/L (ref 15–41)
Albumin: 3.6 g/dL (ref 3.5–5.0)
Alkaline Phosphatase: 73 U/L (ref 38–126)
Anion gap: 7 (ref 5–15)
BUN: 28 mg/dL — ABNORMAL HIGH (ref 8–23)
CO2: 28 mmol/L (ref 22–32)
Calcium: 8.9 mg/dL (ref 8.9–10.3)
Chloride: 103 mmol/L (ref 98–111)
Creatinine, Ser: 1.03 mg/dL — ABNORMAL HIGH (ref 0.44–1.00)
GFR, Estimated: 57 mL/min — ABNORMAL LOW (ref >60)
Glucose, Bld: 111 mg/dL — ABNORMAL HIGH (ref 70–99)
Potassium: 4.5 mmol/L (ref 3.5–5.1)
Sodium: 138 mmol/L (ref 135–145)
Total Bilirubin: 0.7 mg/dL (ref 0.3–1.2)
Total Protein: 6.7 g/dL (ref 6.5–8.1)

## 2021-07-12 LAB — LIPASE, BLOOD: Lipase: 29 U/L (ref 11–51)

## 2021-07-12 MED ORDER — ONDANSETRON HCL 4 MG/2ML IJ SOLN
4.0000 mg | Freq: Once | INTRAMUSCULAR | Status: AC
  Administered 2021-07-12: 4 mg via INTRAVENOUS
  Filled 2021-07-12: qty 2

## 2021-07-12 MED ORDER — FENTANYL CITRATE PF 50 MCG/ML IJ SOSY
50.0000 ug | PREFILLED_SYRINGE | Freq: Once | INTRAMUSCULAR | Status: AC
  Administered 2021-07-12: 50 ug via INTRAVENOUS
  Filled 2021-07-12: qty 1

## 2021-07-12 MED ORDER — PANTOPRAZOLE SODIUM 40 MG PO TBEC: 40.0000 mg | DELAYED_RELEASE_TABLET | Freq: Two times a day (BID) | ORAL | 1 refills | Status: DC

## 2021-07-12 MED ORDER — IOHEXOL 300 MG/ML  SOLN
100.0000 mL | Freq: Once | INTRAMUSCULAR | Status: AC | PRN
Start: 2021-07-12 — End: 2021-07-12
  Administered 2021-07-12: 100 mL via INTRAVENOUS

## 2021-07-12 NOTE — ED Notes (Signed)
Pt returned from CT °

## 2021-07-14 LAB — URINE CULTURE: Culture: NO GROWTH

## 2021-07-14 NOTE — Telephone Encounter (Signed)
Noted  

## 2021-07-29 ENCOUNTER — Telehealth (HOSPITAL_COMMUNITY): Payer: Self-pay | Admitting: Emergency Medicine

## 2021-07-29 NOTE — Telephone Encounter (Signed)
Attempted to call patient regarding upcoming cardiac CT appointment. °Left message on voicemail with name and callback number °Tajuana Kniskern RN Navigator Cardiac Imaging °[facility] °[phone] Office °[phone] Cell ° °

## 2021-07-30 ENCOUNTER — Ambulatory Visit (HOSPITAL_COMMUNITY)
Admission: RE | Admit: 2021-07-30 | Discharge: 2021-07-30 | Disposition: A | Discharge status: Home / Self Care | Payer: PPO | Source: Ambulatory Visit | Attending: Cardiovascular Disease | Admitting: Cardiovascular Disease

## 2021-07-30 DIAGNOSIS — I4819 Other persistent atrial fibrillation: Secondary | ICD-10-CM | POA: Insufficient documentation

## 2021-07-30 MED ORDER — IOHEXOL 350 MG/ML SOLN
100.0000 mL | Freq: Once | INTRAVENOUS | Status: AC | PRN
  Administered 2021-07-30: 100 mL via INTRAVENOUS

## 2021-08-24 ENCOUNTER — Telehealth: Payer: Self-pay

## 2021-08-24 ENCOUNTER — Other Ambulatory Visit: Payer: Self-pay

## 2021-08-24 DIAGNOSIS — I4819 Other persistent atrial fibrillation: Secondary | ICD-10-CM

## 2021-08-24 DIAGNOSIS — D649 Anemia, unspecified: Secondary | ICD-10-CM

## 2021-08-24 MED ORDER — SODIUM CHLORIDE 0.9 % IV SOLN: INTRAVENOUS | Status: DC

## 2021-08-24 NOTE — Telephone Encounter (Signed)
-----   Message from [name], MD sent at 07/30/2021  5:17 PM EDT ----- The patient has favorable anatomy for LAAO with Watchman Flx if she would like to proceed as discussed in our recent office consultation. thx

## 2021-08-24 NOTE — Telephone Encounter (Signed)
On 7/14, I spoke with the patient's daughter, Donella Stade, in great detail, about Watchman and CT results. She stated the patient was worried about going back on blood thinners even for a short amount of time. [name] said she would talk with her mom and give her time to think. Per [name]'s request, called her 8/3 to follow-up but left a message.  [name] called back 8/4 and spoke with Kathyrn Drown and informed her Ms. Moon wants to proceed with LAAO. Spoke with [name] today, who wishes to have Ms. Tunney have [place] on 11/16 with Dr. Burt Knack. She cannot do earlier dates due to work. Scheduled her for pre-procedure office visit with Nell Range on 11/13/2021. She was grateful for call and agrees with plan.

## 2021-08-24 NOTE — Telephone Encounter (Signed)
-----   Message from Tommie Raymond, NP sent at 08/21/2021  3:28 PM EDT ----- Regarding: LAAO Hey! This patient called back to confirm that they are interested in [place] closure. I reviewed the process again but did not confirm a date. I let them know once you could see where they would fall on the schedule, you would let them know and I would see them in pre LAAO follow up  Thanks! JM

## 2021-08-25 ENCOUNTER — Telehealth: Payer: Self-pay

## 2021-08-25 NOTE — Progress Notes (Deleted)
Cardiology Office Note    Date:  08/25/2021   ID:  Joslin, Doell [birth year], MRN [id number]  PCP:  Lemmie Evens, MD  Cardiologist: Rozann Lesches, MD    No chief complaint on file.   History of Present Illness:    JASMEET MANTON is a 76 y.o. female with past medical history of persistent atrial fibrillation, chronic back pain, hypothyroidism and anemia who presents to the office today for 27-monthfollow-up.  She was last examined by Dr. MDomenic Politein 05/2021 and denied any recent chest pain or palpitations at that time.  Given her history of anemia and GI bleeding, she was referred to the structural heart group for consideration of Watchman device placement. She did meet with Dr. CBurt Knackand underwent a Cardiac CT in 07/2021 which showed favorable anatomy for Watchman device.  She was initially hesitant to be on anticoagulation for even a short time but did call the office earlier this week and wishes to proceed. This has tentatively been scheduled for 12/03/2021.  Past Medical History:  Diagnosis Date   Anemia    Anxiety    Arthritis    Chronic back pain    Spondylosis and stenosis   Chronic kidney disease, stage 3a (HCC)    Depression    GERD (gastroesophageal reflux disease)    Headache(784.0)    Hiatal hernia    History of blood transfusion    History of GI bleed    Hypothyroidism    Mitral regurgitation    Nephrolithiasis    Obsessive-compulsive disorder    Persistent atrial fibrillation (HCC)    Recurrent upper respiratory infection (URI)    Sleep apnea     Past Surgical History:  Procedure Laterality Date   ABDOMINAL HYSTERECTOMY     ANTERIOR CERVICAL DECOMP/DISCECTOMY FUSION  12/09/2010   Procedure: ANTERIOR CERVICAL DECOMPRESSION/DISCECTOMY FUSION 3 LEVELS;  Surgeon: JOphelia Charter  [facility];  Service: Neurosurgery;  Laterality: N/A;  Cervical three-four,Cervical four-five,Cervical Five-Six,Cervical Six-Seven ANTERIOR CERVICAL DECOMPRESSION  WITH FUSION INTERBODY PROTHESIS PLATING AND BONEGRAFT   APPENDECTOMY     BIOPSY N/A 11/14/2014   Procedure: GASTRIC BIOPSY;  Surgeon: RDaneil Dolin MD;  [facility];  Service: Endoscopy;  Laterality: N/A;   CESAREAN SECTION     CHOLECYSTECTOMY     COLONOSCOPY WITH PROPOFOL N/A 11/14/2014   RMR: redundant colon but otherwise normal   COLONOSCOPY WITH PROPOFOL N/A 05/12/2020   Dr. RGala Romney  Seven 4 to 7 mm polyps, diverticulosis in the sigmoid colon. Pathology with tubular adenomas. Recommended repeat in 3 years.   ESOPHAGOGASTRODUODENOSCOPY (EGD) WITH PROPOFOL N/A 11/14/2014   RMR"s/p gastric surgery/anastomtic ulcer   ESOPHAGOGASTRODUODENOSCOPY (EGD) WITH PROPOFOL N/A 09/15/2017   Procedure: ESOPHAGOGASTRODUODENOSCOPY (EGD) WITH PROPOFOL;  Surgeon: RDaneil Dolin MD;  [facility];  Service: Endoscopy;  Laterality: N/A;  11:30am   ESOPHAGOGASTRODUODENOSCOPY (EGD) WITH PROPOFOL N/A 04/04/2019   2 cm ulcer crater at anastomosis with smaller satellite areas of ulceration in setting of NSAIDs/Goody powders, no bleeding stigmata.   ESOPHAGOGASTRODUODENOSCOPY (EGD) WITH PROPOFOL N/A 06/13/2019   Rourk: Prior gastric bypass surgery with Billroth II type anatomy, otherwise normal-appearing residual upper GI tract.   EYE SURGERY     bilateral cataracts removed, w/IOL   GASTRIC BYPASS  2008   JOINT REPLACEMENT     07/2010 &  2002- respectively- both knees    MALONEY DILATION N/A 09/15/2017   Procedure: MALONEY DILATION;  Surgeon: RDaneil Dolin MD;  [facility];  Service: Endoscopy;  Laterality: N/A;   PARATHYROIDECTOMY     PERIPHERALLY INSERTED CENTRAL CATHETER INSERTION     PICC line for treatment of MRSA   POLYPECTOMY  05/12/2020   Procedure: POLYPECTOMY;  Surgeon: Daneil Dolin, MD;  [facility];  Service: Endoscopy;;   TONSILLECTOMY     as a child    Current Medications: Outpatient Medications Prior to Visit  Medication Sig Dispense Refill    atenolol (TENORMIN) 50 MG tablet TAKE 1 TABLET(50 MG) BY MOUTH DAILY 90 tablet 3   Cyanocobalamin (VITAMIN B-12 IJ) Inject 0.1 mLs as directed every 30 (thirty) days.     diltiazem (CARDIZEM CD) 240 MG 24 hr capsule Take 1 capsule (240 mg total) by mouth daily. 90 capsule 3   levothyroxine (SYNTHROID, LEVOTHROID) 75 MCG tablet Take 75 mcg by mouth daily before breakfast.     lubiprostone (AMITIZA) 24 MCG capsule TAKE 1 CAPSULE(24 MCG) BY MOUTH TWICE DAILY WITH A MEAL 60 capsule 3   metoprolol tartrate (LOPRESSOR) 25 MG tablet Take 1 tablet by mouth 2 hours prior to scan, take second tablet with you to test 2 tablet 0   oxybutynin (DITROPAN-XL) 5 MG 24 hr tablet Take 5 mg by mouth daily.     pantoprazole (PROTONIX) 40 MG tablet Take 1 tablet (40 mg total) by mouth 2 (two) times daily. 60 tablet 1   venlafaxine (EFFEXOR) 75 MG tablet Take 75 mg by mouth 2 (two) times daily.      Vitamin D, Ergocalciferol, (DRISDOL) 50000 units CAPS capsule Take 50,000 Units by mouth every 7 (seven) days.     Facility-Administered Medications Prior to Visit  Medication Dose Route Frequency Provider Last Rate Last Admin   0.9 %  sodium chloride infusion   Intravenous Continuous [name], MD       0.9 %  sodium chloride infusion   Intravenous Continuous [name], MD         Allergies:   Benzodiazepines, Codeine, Escitalopram oxalate, Nsaids, Sertraline hcl, Statins, and Tramadol hcl   Social History   Socioeconomic History   Marital status: Widowed    Spouse name: Not on file   Number of children: 4   Years of education: Not on file   Highest education level: Not on file  Occupational History   Not on file  Tobacco Use   Smoking status: Former    Packs/day: 1.00    Years: 30.00    Total pack years: 30.00    Types: Cigarettes    Quit date: 01/18/1998    Years since quitting: 23.6   Smokeless tobacco: Never  Vaping Use   Vaping Use: Never used  Substance and Sexual Activity   Alcohol  use: No   Drug use: No   Sexual activity: Not on file  Other Topics Concern   Not on file  Social History Narrative   Not on file   Social Determinants of Health   Financial Resource Strain: Not on file  Food Insecurity: Not on file  Transportation Needs: Not on file  Physical Activity: Not on file  Stress: Not on file  Social Connections: Not on file     Family History:  The patient's ***family history includes ADD / ADHD in her daughter; Anxiety disorder in her mother and sister; Bipolar disorder in her daughter; Breast cancer in her mother and sister; Heart attack in her father; OCD in her daughter.   Review of Systems:  Please see the history of present illness.     All other systems reviewed and are otherwise negative except as noted above.   Physical Exam:    VS:  There were no vitals taken for this visit.   General: Well developed, well nourished,female appearing in no acute distress. Head: Normocephalic, atraumatic. Neck: No carotid bruits. JVD not elevated.  Lungs: Respirations regular and unlabored, without wheezes or rales.  Heart: ***Regular rate and rhythm. No S3 or S4.  No murmur, no rubs, or gallops appreciated. Abdomen: Appears non-distended. No obvious abdominal masses. Msk:  Strength and tone appear normal for age. No obvious joint deformities or effusions. Extremities: No clubbing or cyanosis. No edema.  Distal pedal pulses are 2+ bilaterally. Neuro: Alert and oriented X 3. Moves all extremities spontaneously. No focal deficits noted. Psych:  Responds to questions appropriately with a normal affect. Skin: No rashes or lesions noted  Wt Readings from Last 3 Encounters:  06/12/21 159 lb 3.2 oz (72.2 kg)  05/25/21 165 lb 12.8 oz (75.2 kg)  05/13/21 162 lb (73.5 kg)        Studies/Labs Reviewed:   EKG:  EKG is*** ordered today.  The ekg ordered today demonstrates ***  Recent Labs: 03/19/2021: TSH 3.520 07/12/2021: ALT 13; BUN 28; Creatinine, Ser  1.03; Hemoglobin 13.0; Platelets 157; Potassium 4.5; Sodium 138   Lipid Panel No results found for: "CHOL", "TRIG", "HDL", "CHOLHDL", "VLDL", "LDLCALC", "LDLDIRECT"  Additional studies/ records that were reviewed today include:   Echocardiogram: 05/2019 IMPRESSIONS     1. Left ventricular ejection fraction, by estimation, is 55 to 60%. The  left ventricle has normal function. The left ventricle has no regional  wall motion abnormalities. There is mild concentric left ventricular  hypertrophy. Left ventricular diastolic  parameters are indeterminate.   2. Right ventricular systolic function is normal. The right ventricular  size is normal. There is normal pulmonary artery systolic pressure.   3. Left atrial size was severely dilated.   4. Right atrial size was mildly dilated.   5. The mitral valve is degenerative. Mild mitral valve regurgitation.   6. The aortic valve is tricuspid. Aortic valve regurgitation is not  visualized. No aortic stenosis is present.   7. The inferior vena cava is dilated in size with >50% respiratory  variability, suggesting right atrial pressure of 8 mmHg.   Event Monitor: 09/2020 ZIO XT reviewed.  7 days, 1 hour analyzed.  Atrial fibrillation is present throughout with heart rate ranging from 57 bpm up to 148 bpm and average heart rate 91 bpm.  Rare PVCs including couplets and triplets were noted representing less than 1% total beats.  There were no pauses.  Cardiac MRI: 07/2021 IMPRESSION: 1.  Severe LAE, moderate RAE   2.  No PFO/ASD   3.  Windsock LAA with no thrombus   4. Landing Zone 24.1 mm with depth 28.5 suitable for a 31 mm Watchman FLX   5.  Working angle LAO 2 Caudal 5 degrees   6. No pericardial effusion Heart shifted to left which may make TEE imaging challenging   7.  Normal ascending thoracic aorta 3.7 cm   8.  Calcium score 302 which is 80 th percentile for age/sex IMPRESSION: 1. Mild cardiomegaly. 2. Dilated main pulmonary  trunk suggesting pulmonary arterial hypertension. 3. Scattered tiny 2-3 mm pulmonary nodules, stable in appearance from 2021. No follow-up recommended.   Assessment:    No diagnosis found.   Plan:   In order  of problems listed above:  1. Persistent Atrial Fibrillation - ***  2. Anemia - ***  3. Hypothyroidism - ***   Shared Decision Making/Informed Consent:   {Are you ordering a CV Procedure (e.g. stress test, cath, DCCV, TEE, etc)?   Press F2        :[id number]}    Medication Adjustments/Labs and Tests Ordered: Current medicines are reviewed at length with the patient today.  Concerns regarding medicines are outlined above.  Medication changes, Labs and Tests ordered today are listed in the Patient Instructions below. There are no Patient Instructions on file for this visit.   Signed, Erma Heritage, PA-C  08/25/2021 11:25 AM    [place] [address] [place], [place] [address] Phone: [phone] Fax: [phone]

## 2021-08-25 NOTE — Telephone Encounter (Signed)
Discussed with Dr. Burt Knack. The patient will need to START ELIQUIS 2.5 mg BID 5 days prior to [place].  Will create instruction letter closer to scheduled procedure date (12/03/21).

## 2021-08-26 ENCOUNTER — Ambulatory Visit: Payer: PPO | Admitting: Student

## 2021-09-28 ENCOUNTER — Other Ambulatory Visit: Payer: Self-pay | Admitting: Gastroenterology

## 2021-09-28 ENCOUNTER — Telehealth: Payer: Self-pay

## 2021-09-28 DIAGNOSIS — K219 Gastro-esophageal reflux disease without esophagitis: Secondary | ICD-10-CM

## 2021-09-28 MED ORDER — PANTOPRAZOLE SODIUM 40 MG PO TBEC
40.0000 mg | DELAYED_RELEASE_TABLET | Freq: Two times a day (BID) | ORAL | 3 refills | Status: DC
Start: 2021-09-28 — End: 2022-01-15

## 2021-09-28 NOTE — Telephone Encounter (Signed)
Pt states that the prevacid 30 mg bid did not work that she wants to go back to the pantoprazole because it worked better.

## 2021-09-28 NOTE — Telephone Encounter (Signed)
Pt's daughter was made aware.

## 2021-09-28 NOTE — Telephone Encounter (Signed)
Noted.  Rx sent.

## 2021-09-28 NOTE — Telephone Encounter (Signed)
Pt is requesting refills on pantoprazole 40 mg qty: 60. Pt was last seen on 05/06/2021. [place].

## 2021-09-28 NOTE — Telephone Encounter (Signed)
Lmom for pt's daughter to return my call.

## 2021-09-28 NOTE — Telephone Encounter (Signed)
Patient should be taking Prevacid 30 mg BID. This was recommended at her last OV. Please verify if patient is taking Prevacid or Protonix.

## 2021-11-12 NOTE — Progress Notes (Addendum)
[name]                                     Cardiology Office Note:    Date:  11/13/2021   ID:  [name], DOB [birth year], MRN [id number]  PCP:  Lemmie Evens, MD  [facility] Cardiologist:  Rozann Lesches, MD  [place] Electrophysiologist:  None   Referring MD: Lemmie Evens, MD   Pre Watchman visit - set up for 11/16  History of Present Illness:    [name] is a 76 y.o. female with a hx of OCD, OSA, mild cognitive impairment, gastric bypass surgery, CKD stage II, anemia, GI bleeding and persistent atrial fibrillation who presents to clinic to discuss upcoming [place] with Watchman.   The patient has a history of gastric bypass in 2007.  She was noted to have an anastomotic ulcer in 2016.  She has had hospitalizations for abdominal pain in 2021 and was found to have an ulcer at that time.  Colonoscopy in 2022 demonstrated multiple polyps and diverticulosis with a repeat colonoscopy recommended at a 3-year interval.  She has had iron deficiency anemia and last received Feraheme in 2022.  The patient has struggled with chronic nausea and vomiting and abdominal pain.  Her iron deficiency anemia is felt to be related to malabsorption.  She has not had overt gastrointestinal bleeding.  She was seen by GI in April 2023 and felt to be a reasonable candidate for short-term anticoagulation, but there was a plan for further discussion with Dr. Gala Romney documented.  Per documentation he has reviewed her case and cleared her to use short-term anticoagulation but it is important for her to understand there is no way to prevent a future GI bleed and it will be important for her to avoid all nonsteroidal anti-inflammatory products.  She was referred to Dr. Burt Knack for evaluation of possible LAAO with Watchman. Cardiac CT showed a Windsock LAA with no thrombus suitable for a 31 mm Watchman FLX. Heart noted to be shifted to left which may  make TEE imaging challenging.  Today the patient presents to clinic for follow up. No CP or SOB. No LE edema, orthopnea or PND. She has chronic dizziness but no syncope. No blood in stool or urine. Occasional palpitations before she takes her meds.     Past Medical History:  Diagnosis Date   Anemia    Anxiety    Arthritis    Chronic back pain    Spondylosis and stenosis   Chronic kidney disease, stage 3a (HCC)    Depression    GERD (gastroesophageal reflux disease)    Headache(784.0)    Hiatal hernia    History of blood transfusion    History of GI bleed    Hypothyroidism    Mitral regurgitation    Nephrolithiasis    Obsessive-compulsive disorder    Persistent atrial fibrillation (HCC)    Recurrent upper respiratory infection (URI)    Sleep apnea     Past Surgical History:  Procedure Laterality Date   ABDOMINAL HYSTERECTOMY     ANTERIOR CERVICAL DECOMP/DISCECTOMY FUSION  12/09/2010   Procedure: ANTERIOR CERVICAL DECOMPRESSION/DISCECTOMY FUSION 3 LEVELS;  Surgeon: Ophelia Charter;  Location: [facility];  Service: Neurosurgery;  Laterality: N/A;  Cervical three-four,Cervical four-five,Cervical Five-Six,Cervical Six-Seven ANTERIOR CERVICAL DECOMPRESSION WITH FUSION INTERBODY PROTHESIS PLATING AND BONEGRAFT  APPENDECTOMY     BIOPSY N/A 11/14/2014   Procedure: GASTRIC BIOPSY;  Surgeon: Daneil Dolin, MD;  Location: [facility];  Service: Endoscopy;  Laterality: N/A;   CESAREAN SECTION     CHOLECYSTECTOMY     COLONOSCOPY WITH PROPOFOL N/A 11/14/2014   RMR: redundant colon but otherwise normal   COLONOSCOPY WITH PROPOFOL N/A 05/12/2020   Dr. Gala Romney;  Seven 4 to 7 mm polyps, diverticulosis in the sigmoid colon. Pathology with tubular adenomas. Recommended repeat in 3 years.   ESOPHAGOGASTRODUODENOSCOPY (EGD) WITH PROPOFOL N/A 11/14/2014   RMR"s/p gastric surgery/anastomtic ulcer   ESOPHAGOGASTRODUODENOSCOPY (EGD) WITH PROPOFOL N/A 09/15/2017   Procedure:  ESOPHAGOGASTRODUODENOSCOPY (EGD) WITH PROPOFOL;  Surgeon: Daneil Dolin, MD;  [facility];  Service: Endoscopy;  Laterality: N/A;  11:30am   ESOPHAGOGASTRODUODENOSCOPY (EGD) WITH PROPOFOL N/A 04/04/2019   2 cm ulcer crater at anastomosis with smaller satellite areas of ulceration in setting of NSAIDs/Goody powders, no bleeding stigmata.   ESOPHAGOGASTRODUODENOSCOPY (EGD) WITH PROPOFOL N/A 06/13/2019   Rourk: Prior gastric bypass surgery with Billroth II type anatomy, otherwise normal-appearing residual upper GI tract.   EYE SURGERY     bilateral cataracts removed, w/IOL   GASTRIC BYPASS  2008   JOINT REPLACEMENT     07/2010 &  2002- respectively- both knees    MALONEY DILATION N/A 09/15/2017   Procedure: Venia Minks DILATION;  Surgeon: Daneil Dolin, MD;  [facility];  Service: Endoscopy;  Laterality: N/A;   PARATHYROIDECTOMY     PERIPHERALLY INSERTED CENTRAL CATHETER INSERTION     PICC line for treatment of MRSA   POLYPECTOMY  05/12/2020   Procedure: POLYPECTOMY;  Surgeon: Daneil Dolin, MD;  [facility];  Service: Endoscopy;;   TONSILLECTOMY     as a child    Current Medications: Current Meds  Medication Sig   apixaban (ELIQUIS) 2.5 MG TABS tablet Take 1 tablet (2.5 mg total) by mouth 2 (two) times daily. Start taking (1) tablet twice a day on 11/10   atenolol (TENORMIN) 50 MG tablet TAKE 1 TABLET(50 MG) BY MOUTH DAILY   Cyanocobalamin (VITAMIN B-12 IJ) Inject 0.1 mLs as directed every 30 (thirty) days.   diltiazem (CARDIZEM CD) 240 MG 24 hr capsule Take 1 capsule (240 mg total) by mouth daily.   levothyroxine (SYNTHROID, LEVOTHROID) 75 MCG tablet Take 75 mcg by mouth daily before breakfast.   lubiprostone (AMITIZA) 24 MCG capsule TAKE 1 CAPSULE(24 MCG) BY MOUTH TWICE DAILY WITH A MEAL   metoprolol tartrate (LOPRESSOR) 25 MG tablet Take 1 tablet by mouth 2 hours prior to scan, take second tablet with you to test   oxybutynin (DITROPAN-XL) 5 MG 24  hr tablet Take 5 mg by mouth daily.   pantoprazole (PROTONIX) 40 MG tablet Take 1 tablet (40 mg total) by mouth 2 (two) times daily before a meal.   venlafaxine (EFFEXOR) 75 MG tablet Take 75 mg by mouth 2 (two) times daily.    Vitamin D, Ergocalciferol, (DRISDOL) 50000 units CAPS capsule Take 50,000 Units by mouth every 7 (seven) days.   Current Facility-Administered Medications for the 11/13/21 encounter (Office Visit) with Eileen Stanford, PA-C  Medication   0.9 %  sodium chloride infusion   0.9 %  sodium chloride infusion     Allergies:   Benzodiazepines, Codeine, Escitalopram oxalate, Nsaids, Sertraline hcl, Statins, and Tramadol hcl   Social History   Socioeconomic History   Marital status: Widowed    Spouse name: Not  on file   Number of children: 4   Years of education: Not on file   Highest education level: Not on file  Occupational History   Not on file  Tobacco Use   Smoking status: Former    Packs/day: 1.00    Years: 30.00    Total pack years: 30.00    Types: Cigarettes    Quit date: 01/18/1998    Years since quitting: 23.8   Smokeless tobacco: Never  Vaping Use   Vaping Use: Never used  Substance and Sexual Activity   Alcohol use: No   Drug use: No   Sexual activity: Not on file  Other Topics Concern   Not on file  Social History Narrative   Not on file   Social Determinants of Health   Financial Resource Strain: Not on file  Food Insecurity: Not on file  Transportation Needs: Not on file  Physical Activity: Not on file  Stress: Not on file  Social Connections: Not on file     Family History: The patient's family history includes ADD / ADHD in her daughter; Anxiety disorder in her mother and sister; Bipolar disorder in her daughter; Breast cancer in her mother and sister; Heart attack in her father; OCD in her daughter. There is no history of Anesthesia problems, Hypotension, Malignant hyperthermia, Pseudochol deficiency, Dementia, Alcohol abuse,  Drug abuse, Depression, Paranoid behavior, Schizophrenia, Seizures, Sexual abuse, Physical abuse, or Colon cancer.  ROS:   Please see the history of present illness.    All other systems reviewed and are negative.  EKGs/Labs/Other Studies Reviewed:    The following studies were reviewed today:   CT cardiac 07/30/21 IMPRESSION: 1. Mild cardiomegaly. 2. Dilated main pulmonary trunk suggesting pulmonary arterial hypertension. 3. Scattered tiny 2-3 mm pulmonary nodules, stable in appearance from 2021. No follow-up recommended.  IMPRESSION: 1.  Severe LAE, moderate RAE   2.  No PFO/ASD   3.  Windsock LAA with no thrombus   4. Landing Zone 24.1 mm with depth 28.5 suitable for a 31 mm Watchman FLX   5.  Working angle LAO 2 Caudal 5 degrees   6. No pericardial effusion Heart shifted to left which may make TEE imaging challenging   7.  Normal ascending thoracic aorta 3.7 cm   8.  Calcium score 302 which is 80 th percentile for age/sex  EKG:  EKG is ordered today.  The ekg ordered today demonstrates afib HR 79  Recent Labs: 03/19/2021: TSH 3.520 07/12/2021: ALT 13; BUN 28; Creatinine, Ser 1.03; Hemoglobin 13.0; Platelets 157; Potassium 4.5; Sodium 138  Recent Lipid Panel No results found for: "CHOL", "TRIG", "HDL", "CHOLHDL", "VLDL", "LDLCALC", "LDLDIRECT"   Risk Assessment/Calculations:    CHA2DS2-VASc Score = 4   This indicates a 4.8% annual risk of stroke. The patient's score is based upon: CHF History: 0 HTN History: 1 Diabetes History: 0 Stroke History: 0 Vascular Disease History: 1 Age Score: 1 Gender Score: 1    HAS-BLED score = 2 Hypertension No  Abnormal renal and liver function (Dialysis, transplant, Cr >2.26 mg/dL /Cirrhosis or Bilirubin >2x Normal or AST/ALT/AP >3x Normal) No  Stroke No  Bleeding Yes  Labile INR (Unstable/high INR) No  Elderly (>65) Yes  Drugs or alcohol (? 8 drinks/week, anti-plt or NSAID) No    Physical Exam:    VS:  BP 130/80  (BP Location: Left Arm, Patient Position: Sitting, Cuff Size: Normal)   Pulse 79   Ht '5\' 5"'$  (1.651 m)  Wt 168 lb (76.2 kg)   BMI 27.96 kg/m     Wt Readings from Last 3 Encounters:  11/13/21 168 lb (76.2 kg)  06/12/21 159 lb 3.2 oz (72.2 kg)  05/25/21 165 lb 12.8 oz (75.2 kg)     GEN:  Well nourished, well developed in no acute distress HEENT: Normal NECK: No JVD LYMPHATICS: No lymphadenopathy CARDIAC: irreg irreg, no murmurs, rubs, gallops RESPIRATORY:  Clear to auscultation without rales, wheezing or rhonchi  ABDOMEN: Soft, non-tender, non-distended MUSCULOSKELETAL:  No edema; No deformity  SKIN: Warm and dry NEUROLOGIC:  Alert and oriented x 3 PSYCHIATRIC:  Normal affect   ASSESSMENT:    1. Persistent atrial fibrillation ([place])   2. Anemia, unspecified type   3. History of GI bleed    PLAN:    In order of problems listed above:  Persistent atrial fibrillation with history of GI bleeding: LAAO with Watchman planned for 11/16 with Dr. Burt Knack. Start Eliquis 2.'5mg'$  BID on 11/27/21. CBC and BMET today. Letter for surgery given to patient    Medication Adjustments/Labs and Tests Ordered: Current medicines are reviewed at length with the patient today.  Concerns regarding medicines are outlined above.  Orders Placed This Encounter  Procedures   Basic metabolic panel   CBC   EKG 12-Lead   Meds ordered this encounter  Medications   apixaban (ELIQUIS) 2.5 MG TABS tablet    Sig: Take 1 tablet (2.5 mg total) by mouth 2 (two) times daily. Start taking (1) tablet twice a day on 11/10    Dispense:  60 tablet    Refill:  1    Patient Instructions  Medication Instructions:  Please start Eliquis 2.5 mg (1) tablet twice a day on November 10th.   Continue all other medications as listed.  *If you need a refill on your cardiac medications before your next appointment, please call your pharmacy*  Follow-Up: At [facility], you and your health needs are our priority.   As part of our continuing mission to provide you with exceptional heart care, we have created designated Provider Care Teams.  These Care Teams include your primary Cardiologist (physician) and Advanced Practice Providers (APPs -  Physician Assistants and Nurse Practitioners) who all work together to provide you with the care you need, when you need it.  We recommend signing up for the patient portal called "MyChart".  Sign up information is provided on this After Visit Summary.  MyChart is used to connect with patients for Virtual Visits (Telemedicine).  Patients are able to view lab/test results, encounter notes, upcoming appointments, etc.  Non-urgent messages can be sent to your provider as well.   To learn more about what you can do with MyChart, go to [email or web address].    Your next appointment:   As scheduled.  Important Information About Sugar         Signed, Angelena Form, PA-C  11/13/2021 11:16 AM    [facility]

## 2021-11-12 NOTE — H&P (View-Only) (Signed)
[place]                                     Cardiology Office Note:    Date:  11/13/2021   ID:  TERRIE HARING, DOB 1945-01-30, MRN [id number]  PCP:  Lemmie Evens, MD  [facility] Cardiologist:  Rozann Lesches, MD  [place] Electrophysiologist:  None   Referring MD: Lemmie Evens, MD   Pre Watchman visit - set up for 11/16  History of Present Illness:    [name] is a 76 y.o. female with a hx of OCD, OSA, mild cognitive impairment, gastric bypass surgery, CKD stage II, anemia, GI bleeding and persistent atrial fibrillation who presents to clinic to discuss upcoming [place] with Watchman.   The patient has a history of gastric bypass in 2007.  She was noted to have an anastomotic ulcer in 2016.  She has had hospitalizations for abdominal pain in 2021 and was found to have an ulcer at that time.  Colonoscopy in 2022 demonstrated multiple polyps and diverticulosis with a repeat colonoscopy recommended at a 3-year interval.  She has had iron deficiency anemia and last received Feraheme in 2022.  The patient has struggled with chronic nausea and vomiting and abdominal pain.  Her iron deficiency anemia is felt to be related to malabsorption.  She has not had overt gastrointestinal bleeding.  She was seen by GI in April 2023 and felt to be a reasonable candidate for short-term anticoagulation, but there was a plan for further discussion with Dr. Gala Romney documented.  Per documentation he has reviewed her case and cleared her to use short-term anticoagulation but it is important for her to understand there is no way to prevent a future GI bleed and it will be important for her to avoid all nonsteroidal anti-inflammatory products.  She was referred to Dr. Burt Knack for evaluation of possible LAAO with Watchman. Cardiac CT showed a Windsock LAA with no thrombus suitable for a 31 mm Watchman FLX. Heart noted to be shifted to left which may  make TEE imaging challenging.  Today the patient presents to clinic for follow up. No CP or SOB. No LE edema, orthopnea or PND. She has chronic dizziness but no syncope. No blood in stool or urine. Occasional palpitations before she takes her meds.     Past Medical History:  Diagnosis Date   Anemia    Anxiety    Arthritis    Chronic back pain    Spondylosis and stenosis   Chronic kidney disease, stage 3a (HCC)    Depression    GERD (gastroesophageal reflux disease)    Headache(784.0)    Hiatal hernia    History of blood transfusion    History of GI bleed    Hypothyroidism    Mitral regurgitation    Nephrolithiasis    Obsessive-compulsive disorder    Persistent atrial fibrillation (HCC)    Recurrent upper respiratory infection (URI)    Sleep apnea     Past Surgical History:  Procedure Laterality Date   ABDOMINAL HYSTERECTOMY     ANTERIOR CERVICAL DECOMP/DISCECTOMY FUSION  12/09/2010   Procedure: ANTERIOR CERVICAL DECOMPRESSION/DISCECTOMY FUSION 3 LEVELS;  Surgeon: Ophelia Charter;  Location: [place] [facility];  Service: Neurosurgery;  Laterality: N/A;  Cervical three-four,Cervical four-five,Cervical Five-Six,Cervical Six-Seven ANTERIOR CERVICAL DECOMPRESSION WITH FUSION INTERBODY PROTHESIS PLATING AND BONEGRAFT  APPENDECTOMY     BIOPSY N/A 11/14/2014   Procedure: GASTRIC BIOPSY;  Surgeon: Daneil Dolin, MD;  Location: [facility];  Service: Endoscopy;  Laterality: N/A;   CESAREAN SECTION     CHOLECYSTECTOMY     COLONOSCOPY WITH PROPOFOL N/A 11/14/2014   RMR: redundant colon but otherwise normal   COLONOSCOPY WITH PROPOFOL N/A 05/12/2020   Dr. Gala Romney;  Seven 4 to 7 mm polyps, diverticulosis in the sigmoid colon. Pathology with tubular adenomas. Recommended repeat in 3 years.   ESOPHAGOGASTRODUODENOSCOPY (EGD) WITH PROPOFOL N/A 11/14/2014   RMR"s/p gastric surgery/anastomtic ulcer   ESOPHAGOGASTRODUODENOSCOPY (EGD) WITH PROPOFOL N/A 09/15/2017   Procedure:  ESOPHAGOGASTRODUODENOSCOPY (EGD) WITH PROPOFOL;  Surgeon: Daneil Dolin, MD;  Location: [facility];  Service: Endoscopy;  Laterality: N/A;  11:30am   ESOPHAGOGASTRODUODENOSCOPY (EGD) WITH PROPOFOL N/A 04/04/2019   2 cm ulcer crater at anastomosis with smaller satellite areas of ulceration in setting of NSAIDs/Goody powders, no bleeding stigmata.   ESOPHAGOGASTRODUODENOSCOPY (EGD) WITH PROPOFOL N/A 06/13/2019   [place]: Prior gastric bypass surgery with Billroth II type anatomy, otherwise normal-appearing residual upper GI tract.   EYE SURGERY     bilateral cataracts removed, w/IOL   GASTRIC BYPASS  2008   JOINT REPLACEMENT     07/2010 &  2002- respectively- both knees    MALONEY DILATION N/A 09/15/2017   Procedure: Venia Minks DILATION;  Surgeon: Daneil Dolin, MD;  Location: [facility];  Service: Endoscopy;  Laterality: N/A;   PARATHYROIDECTOMY     PERIPHERALLY INSERTED CENTRAL CATHETER INSERTION     PICC line for treatment of MRSA   POLYPECTOMY  05/12/2020   Procedure: POLYPECTOMY;  Surgeon: Daneil Dolin, MD;  Location: [facility];  Service: Endoscopy;;   TONSILLECTOMY     as a child    Current Medications: Current Meds  Medication Sig   apixaban (ELIQUIS) 2.5 MG TABS tablet Take 1 tablet (2.5 mg total) by mouth 2 (two) times daily. Start taking (1) tablet twice a day on 11/10   atenolol (TENORMIN) 50 MG tablet TAKE 1 TABLET(50 MG) BY MOUTH DAILY   Cyanocobalamin (VITAMIN B-12 IJ) Inject 0.1 mLs as directed every 30 (thirty) days.   diltiazem (CARDIZEM CD) 240 MG 24 hr capsule Take 1 capsule (240 mg total) by mouth daily.   levothyroxine (SYNTHROID, LEVOTHROID) 75 MCG tablet Take 75 mcg by mouth daily before breakfast.   lubiprostone (AMITIZA) 24 MCG capsule TAKE 1 CAPSULE(24 MCG) BY MOUTH TWICE DAILY WITH A MEAL   metoprolol tartrate (LOPRESSOR) 25 MG tablet Take 1 tablet by mouth 2 hours prior to scan, take second tablet with you to test   oxybutynin (DITROPAN-XL) 5 MG 24  hr tablet Take 5 mg by mouth daily.   pantoprazole (PROTONIX) 40 MG tablet Take 1 tablet (40 mg total) by mouth 2 (two) times daily before a meal.   venlafaxine (EFFEXOR) 75 MG tablet Take 75 mg by mouth 2 (two) times daily.    Vitamin D, Ergocalciferol, (DRISDOL) 50000 units CAPS capsule Take 50,000 Units by mouth every 7 (seven) days.   Current Facility-Administered Medications for the 11/13/21 encounter (Office Visit) with Eileen Stanford, PA-C  Medication   0.9 %  sodium chloride infusion   0.9 %  sodium chloride infusion     Allergies:   Benzodiazepines, Codeine, Escitalopram oxalate, Nsaids, Sertraline hcl, Statins, and Tramadol hcl   Social History   Socioeconomic History   Marital status: Widowed    Spouse name: Not  on file   Number of children: 4   Years of education: Not on file   Highest education level: Not on file  Occupational History   Not on file  Tobacco Use   Smoking status: Former    Packs/day: 1.00    Years: 30.00    Total pack years: 30.00    Types: Cigarettes    Quit date: 01/18/1998    Years since quitting: 23.8   Smokeless tobacco: Never  Vaping Use   Vaping Use: Never used  Substance and Sexual Activity   Alcohol use: No   Drug use: No   Sexual activity: Not on file  Other Topics Concern   Not on file  Social History Narrative   Not on file   Social Determinants of Health   Financial Resource Strain: Not on file  Food Insecurity: Not on file  Transportation Needs: Not on file  Physical Activity: Not on file  Stress: Not on file  Social Connections: Not on file     Family History: The patient's family history includes ADD / ADHD in her daughter; Anxiety disorder in her mother and sister; Bipolar disorder in her daughter; Breast cancer in her mother and sister; Heart attack in her father; OCD in her daughter. There is no history of Anesthesia problems, Hypotension, Malignant hyperthermia, Pseudochol deficiency, Dementia, Alcohol abuse,  Drug abuse, Depression, Paranoid behavior, Schizophrenia, Seizures, Sexual abuse, Physical abuse, or Colon cancer.  ROS:   Please see the history of present illness.    All other systems reviewed and are negative.  EKGs/Labs/Other Studies Reviewed:    The following studies were reviewed today:   CT cardiac 07/30/21 IMPRESSION: 1. Mild cardiomegaly. 2. Dilated main pulmonary trunk suggesting pulmonary arterial hypertension. 3. Scattered tiny 2-3 mm pulmonary nodules, stable in appearance from 2021. No follow-up recommended.  IMPRESSION: 1.  Severe LAE, moderate RAE   2.  No PFO/ASD   3.  Windsock LAA with no thrombus   4. Landing Zone 24.1 mm with depth 28.5 suitable for a 31 mm Watchman FLX   5.  Working angle LAO 2 Caudal 5 degrees   6. No pericardial effusion Heart shifted to left which may make TEE imaging challenging   7.  Normal ascending thoracic aorta 3.7 cm   8.  Calcium score 302 which is 80 th percentile for age/sex  EKG:  EKG is ordered today.  The ekg ordered today demonstrates afib HR 79  Recent Labs: 03/19/2021: TSH 3.520 07/12/2021: ALT 13; BUN 28; Creatinine, Ser 1.03; Hemoglobin 13.0; Platelets 157; Potassium 4.5; Sodium 138  Recent Lipid Panel No results found for: "CHOL", "TRIG", "HDL", "CHOLHDL", "VLDL", "LDLCALC", "LDLDIRECT"   Risk Assessment/Calculations:    CHA2DS2-VASc Score = 4   This indicates a 4.8% annual risk of stroke. The patient's score is based upon: CHF History: 0 HTN History: 1 Diabetes History: 0 Stroke History: 0 Vascular Disease History: 1 Age Score: 1 Gender Score: 1    HAS-BLED score = 2 Hypertension No  Abnormal renal and liver function (Dialysis, transplant, Cr >2.26 mg/dL /Cirrhosis or Bilirubin >2x Normal or AST/ALT/AP >3x Normal) No  Stroke No  Bleeding Yes  Labile INR (Unstable/high INR) No  Elderly (>65) Yes  Drugs or alcohol (? 8 drinks/week, anti-plt or NSAID) No    Physical Exam:    VS:  BP 130/80  (BP Location: Left Arm, Patient Position: Sitting, Cuff Size: Normal)   Pulse 79   Ht '5\' 5"'$  (1.651 m)  Wt 168 lb (76.2 kg)   BMI 27.96 kg/m     Wt Readings from Last 3 Encounters:  11/13/21 168 lb (76.2 kg)  06/12/21 159 lb 3.2 oz (72.2 kg)  05/25/21 165 lb 12.8 oz (75.2 kg)     GEN:  Well nourished, well developed in no acute distress HEENT: Normal NECK: No JVD LYMPHATICS: No lymphadenopathy CARDIAC: irreg irreg, no murmurs, rubs, gallops RESPIRATORY:  Clear to auscultation without rales, wheezing or rhonchi  ABDOMEN: Soft, non-tender, non-distended MUSCULOSKELETAL:  No edema; No deformity  SKIN: Warm and dry NEUROLOGIC:  Alert and oriented x 3 PSYCHIATRIC:  Normal affect   ASSESSMENT:    1. Persistent atrial fibrillation ([place])   2. Anemia, unspecified type   3. History of GI bleed    PLAN:    In order of problems listed above:  Persistent atrial fibrillation with history of GI bleeding: LAAO with Watchman planned for 11/16 with Dr. Burt Knack. Start Eliquis 2.'5mg'$  BID on 11/27/21. CBC and BMET today. Letter for surgery given to patient    Medication Adjustments/Labs and Tests Ordered: Current medicines are reviewed at length with the patient today.  Concerns regarding medicines are outlined above.  Orders Placed This Encounter  Procedures   Basic metabolic panel   CBC   EKG 12-Lead   Meds ordered this encounter  Medications   apixaban (ELIQUIS) 2.5 MG TABS tablet    Sig: Take 1 tablet (2.5 mg total) by mouth 2 (two) times daily. Start taking (1) tablet twice a day on 11/10    Dispense:  60 tablet    Refill:  1    Patient Instructions  Medication Instructions:  Please start Eliquis 2.5 mg (1) tablet twice a day on November 10th.   Continue all other medications as listed.  *If you need a refill on your cardiac medications before your next appointment, please call your pharmacy*  Follow-Up: At [facility], you and your health needs are our priority.   As part of our continuing mission to provide you with exceptional heart care, we have created designated Provider Care Teams.  These Care Teams include your primary Cardiologist (physician) and Advanced Practice Providers (APPs -  Physician Assistants and Nurse Practitioners) who all work together to provide you with the care you need, when you need it.  We recommend signing up for the patient portal called "MyChart".  Sign up information is provided on this After Visit Summary.  MyChart is used to connect with patients for Virtual Visits (Telemedicine).  Patients are able to view lab/test results, encounter notes, upcoming appointments, etc.  Non-urgent messages can be sent to your provider as well.   To learn more about what you can do with MyChart, go to [email or web address].    Your next appointment:   As scheduled.  Important Information About Sugar         Signed, Angelena Form, PA-C  11/13/2021 11:16 AM    [facility]

## 2021-11-13 ENCOUNTER — Ambulatory Visit: Payer: PPO | Attending: Cardiology | Admitting: Physician Assistant

## 2021-11-13 VITALS — BP 130/80 | HR 79 | Ht 65.0 in | Wt 168.0 lb

## 2021-11-13 DIAGNOSIS — I4819 Other persistent atrial fibrillation: Secondary | ICD-10-CM | POA: Diagnosis not present

## 2021-11-13 DIAGNOSIS — Z8719 Personal history of other diseases of the digestive system: Secondary | ICD-10-CM

## 2021-11-13 DIAGNOSIS — Z87898 Personal history of other specified conditions: Secondary | ICD-10-CM

## 2021-11-13 DIAGNOSIS — D649 Anemia, unspecified: Secondary | ICD-10-CM

## 2021-11-13 LAB — CBC
Hematocrit: 38.9 % (ref 34.0–46.6)
Hemoglobin: 13.1 g/dL (ref 11.1–15.9)
MCH: 32.7 pg (ref 26.6–33.0)
MCHC: 33.7 g/dL (ref 31.5–35.7)
MCV: 97 fL (ref 79–97)
Platelets: 193 10*3/uL (ref 150–450)
RBC: 4.01 x10E6/uL (ref 3.77–5.28)
RDW: 12.5 % (ref 11.7–15.4)
WBC: 8.6 10*3/uL (ref 3.4–10.8)

## 2021-11-13 MED ORDER — APIXABAN 2.5 MG PO TABS: 2.5000 mg | ORAL_TABLET | Freq: Two times a day (BID) | ORAL | 1 refills | Status: DC

## 2021-11-13 NOTE — Addendum Note (Signed)
Addended by: Eileen Stanford on: 11/13/2021 11:16 AM   Modules accepted: Orders

## 2021-11-13 NOTE — Patient Instructions (Signed)
Medication Instructions:  Please start Eliquis 2.5 mg (1) tablet twice a day on November 10th.   Continue all other medications as listed.  *If you need a refill on your cardiac medications before your next appointment, please call your pharmacy*  Follow-Up: At [facility], you and your health needs are our priority.  As part of our continuing mission to provide you with exceptional heart care, we have created designated Provider Care Teams.  These Care Teams include your primary Cardiologist (physician) and Advanced Practice Providers (APPs -  Physician Assistants and Nurse Practitioners) who all work together to provide you with the care you need, when you need it.  We recommend signing up for the patient portal called "MyChart".  Sign up information is provided on this After Visit Summary.  MyChart is used to connect with patients for Virtual Visits (Telemedicine).  Patients are able to view lab/test results, encounter notes, upcoming appointments, etc.  Non-urgent messages can be sent to your provider as well.   To learn more about what you can do with MyChart, go to [email or web address].    Your next appointment:   As scheduled.  Important Information About Sugar

## 2021-11-16 ENCOUNTER — Other Ambulatory Visit: Payer: Self-pay

## 2021-11-16 ENCOUNTER — Other Ambulatory Visit (HOSPITAL_COMMUNITY): Payer: Self-pay | Admitting: Nurse Practitioner

## 2021-11-16 ENCOUNTER — Other Ambulatory Visit: Payer: Self-pay | Admitting: Physician Assistant

## 2021-11-16 DIAGNOSIS — N200 Calculus of kidney: Secondary | ICD-10-CM

## 2021-11-16 DIAGNOSIS — Z8719 Personal history of other diseases of the digestive system: Secondary | ICD-10-CM

## 2021-11-16 DIAGNOSIS — I4819 Other persistent atrial fibrillation: Secondary | ICD-10-CM

## 2021-11-16 DIAGNOSIS — D649 Anemia, unspecified: Secondary | ICD-10-CM

## 2021-11-26 ENCOUNTER — Other Ambulatory Visit: Payer: Self-pay

## 2021-11-26 DIAGNOSIS — Z8719 Personal history of other diseases of the digestive system: Secondary | ICD-10-CM

## 2021-11-26 DIAGNOSIS — I4819 Other persistent atrial fibrillation: Secondary | ICD-10-CM

## 2021-12-01 ENCOUNTER — Telehealth: Payer: Self-pay

## 2021-12-01 NOTE — Telephone Encounter (Signed)
Confirmed procedure date of 12/03/2021. Confirmed arrival time of 0900 for procedure time at 1130. Reviewed pre-procedure instructions with patient's daughter. She said that [name] started her Eliquis and is having no issues. She will take her to have her BMET tomorrow since she has not yet. The patient understands to call if questions/concerns arise prior to procedure.

## 2021-12-02 ENCOUNTER — Other Ambulatory Visit: Payer: Self-pay

## 2021-12-02 ENCOUNTER — Encounter (HOSPITAL_COMMUNITY): Payer: Self-pay | Admitting: Cardiovascular Disease

## 2021-12-02 DIAGNOSIS — I4819 Other persistent atrial fibrillation: Secondary | ICD-10-CM

## 2021-12-02 NOTE — Progress Notes (Addendum)
PCP - Dr Lemmie Evens Cardiologist - Dr Addison Naegeli - Dr [name]  Chest x-ray - DOS (2V) EKG - 11/13/21 Stress Test - 02/17/10 ECHO TEE - DOS; 06/10/19 Cardiac Cath - 2003-Normal  ICD Pacemaker/Loop - n/a  Sleep Study -  Yes CPAP - obtain on DOS  Blood Thinner Instructions:  Follow your surgeon's instructions on when to stop Eliquis prior to surgery.  Instructions for DOS were given to patient's daughter  per Harland German, RN.  See note dated 12/01/21 in EPIC.

## 2021-12-03 ENCOUNTER — Inpatient Hospital Stay (HOSPITAL_COMMUNITY)
Admission: RE | Admit: 2021-12-03 | Discharge: 2021-12-03 | Disposition: A | Discharge status: Home / Self Care | Payer: PPO | Source: Ambulatory Visit | Attending: Cardiovascular Disease | Admitting: Cardiovascular Disease

## 2021-12-03 ENCOUNTER — Encounter (HOSPITAL_COMMUNITY)
Admission: RE | Disposition: A | Discharge status: Home / Self Care | Payer: Self-pay | Source: Home / Self Care | Attending: Cardiovascular Disease

## 2021-12-03 ENCOUNTER — Inpatient Hospital Stay (HOSPITAL_COMMUNITY)
Admission: RE | Admit: 2021-12-03 | Discharge: 2021-12-04 | DRG: 274 | Disposition: A | Discharge status: Home / Self Care | Payer: PPO | Attending: Cardiovascular Disease | Admitting: Cardiovascular Disease

## 2021-12-03 ENCOUNTER — Inpatient Hospital Stay (HOSPITAL_COMMUNITY): Payer: PPO

## 2021-12-03 ENCOUNTER — Inpatient Hospital Stay (HOSPITAL_COMMUNITY): Payer: PPO | Admitting: Certified Registered Nurse Anesthetist

## 2021-12-03 ENCOUNTER — Encounter (HOSPITAL_COMMUNITY): Payer: Self-pay | Admitting: Cardiovascular Disease

## 2021-12-03 ENCOUNTER — Other Ambulatory Visit: Payer: Self-pay

## 2021-12-03 DIAGNOSIS — I4819 Other persistent atrial fibrillation: Principal | ICD-10-CM | POA: Diagnosis present

## 2021-12-03 DIAGNOSIS — I10 Essential (primary) hypertension: Secondary | ICD-10-CM | POA: Diagnosis present

## 2021-12-03 DIAGNOSIS — D509 Iron deficiency anemia, unspecified: Secondary | ICD-10-CM | POA: Diagnosis present

## 2021-12-03 DIAGNOSIS — F32A Depression, unspecified: Secondary | ICD-10-CM | POA: Diagnosis present

## 2021-12-03 DIAGNOSIS — D638 Anemia in other chronic diseases classified elsewhere: Secondary | ICD-10-CM

## 2021-12-03 DIAGNOSIS — F419 Anxiety disorder, unspecified: Secondary | ICD-10-CM | POA: Diagnosis present

## 2021-12-03 DIAGNOSIS — Z981 Arthrodesis status: Secondary | ICD-10-CM

## 2021-12-03 DIAGNOSIS — Z79899 Other long term (current) drug therapy: Secondary | ICD-10-CM

## 2021-12-03 DIAGNOSIS — Z888 Allergy status to other drugs, medicaments and biological substances status: Secondary | ICD-10-CM | POA: Diagnosis not present

## 2021-12-03 DIAGNOSIS — Z96653 Presence of artificial knee joint, bilateral: Secondary | ICD-10-CM | POA: Diagnosis present

## 2021-12-03 DIAGNOSIS — Z90710 Acquired absence of both cervix and uterus: Secondary | ICD-10-CM

## 2021-12-03 DIAGNOSIS — Z95818 Presence of other cardiac implants and grafts: Principal | ICD-10-CM

## 2021-12-03 DIAGNOSIS — K219 Gastro-esophageal reflux disease without esophagitis: Secondary | ICD-10-CM | POA: Diagnosis present

## 2021-12-03 DIAGNOSIS — G3184 Mild cognitive impairment, so stated: Secondary | ICD-10-CM | POA: Diagnosis present

## 2021-12-03 DIAGNOSIS — I083 Combined rheumatic disorders of mitral, aortic and tricuspid valves: Secondary | ICD-10-CM | POA: Diagnosis present

## 2021-12-03 DIAGNOSIS — Z8249 Family history of ischemic heart disease and other diseases of the circulatory system: Secondary | ICD-10-CM | POA: Diagnosis not present

## 2021-12-03 DIAGNOSIS — E039 Hypothyroidism, unspecified: Secondary | ICD-10-CM

## 2021-12-03 DIAGNOSIS — I4811 Longstanding persistent atrial fibrillation: Secondary | ICD-10-CM

## 2021-12-03 DIAGNOSIS — Z8719 Personal history of other diseases of the digestive system: Secondary | ICD-10-CM

## 2021-12-03 DIAGNOSIS — N1831 Chronic kidney disease, stage 3a: Secondary | ICD-10-CM | POA: Diagnosis present

## 2021-12-03 DIAGNOSIS — Z9884 Bariatric surgery status: Secondary | ICD-10-CM | POA: Diagnosis not present

## 2021-12-03 DIAGNOSIS — I4891 Unspecified atrial fibrillation: Secondary | ICD-10-CM | POA: Diagnosis present

## 2021-12-03 DIAGNOSIS — Z79890 Hormone replacement therapy (postmenopausal): Secondary | ICD-10-CM

## 2021-12-03 DIAGNOSIS — Z87891 Personal history of nicotine dependence: Secondary | ICD-10-CM

## 2021-12-03 DIAGNOSIS — Z7901 Long term (current) use of anticoagulants: Secondary | ICD-10-CM

## 2021-12-03 DIAGNOSIS — M549 Dorsalgia, unspecified: Secondary | ICD-10-CM | POA: Diagnosis present

## 2021-12-03 DIAGNOSIS — Z885 Allergy status to narcotic agent status: Secondary | ICD-10-CM

## 2021-12-03 DIAGNOSIS — K909 Intestinal malabsorption, unspecified: Secondary | ICD-10-CM | POA: Diagnosis present

## 2021-12-03 DIAGNOSIS — Z87442 Personal history of urinary calculi: Secondary | ICD-10-CM | POA: Diagnosis not present

## 2021-12-03 DIAGNOSIS — Z006 Encounter for examination for normal comparison and control in clinical research program: Secondary | ICD-10-CM

## 2021-12-03 DIAGNOSIS — Z9049 Acquired absence of other specified parts of digestive tract: Secondary | ICD-10-CM | POA: Diagnosis not present

## 2021-12-03 DIAGNOSIS — Z818 Family history of other mental and behavioral disorders: Secondary | ICD-10-CM | POA: Diagnosis not present

## 2021-12-03 DIAGNOSIS — Z8711 Personal history of peptic ulcer disease: Secondary | ICD-10-CM

## 2021-12-03 DIAGNOSIS — D649 Anemia, unspecified: Secondary | ICD-10-CM | POA: Diagnosis present

## 2021-12-03 DIAGNOSIS — G4733 Obstructive sleep apnea (adult) (pediatric): Secondary | ICD-10-CM | POA: Diagnosis present

## 2021-12-03 HISTORY — PX: TEE WITHOUT CARDIOVERSION: SHX5443

## 2021-12-03 HISTORY — PX: LEFT ATRIAL APPENDAGE OCCLUSION: EP1229

## 2021-12-03 HISTORY — DX: Presence of other cardiac implants and grafts: Z95.818

## 2021-12-03 LAB — ECHO TEE
AR max vel: 1.44 cm2
AV Area VTI: 1.48 cm2
AV Area mean vel: 1.58 cm2
AV Mean grad: 6 mmHg
AV Peak grad: 9.9 mmHg
Ao pk vel: 1.57 m/s

## 2021-12-03 LAB — TYPE AND SCREEN
ABO/RH(D): A POS
Antibody Screen: NEGATIVE

## 2021-12-03 LAB — BASIC METABOLIC PANEL
Anion gap: 8 (ref 5–15)
BUN/Creatinine Ratio: 18 (ref 12–28)
BUN: 21 mg/dL (ref 8–27)
BUN: 21 mg/dL (ref 8–23)
CO2: 26 mmol/L (ref 20–29)
CO2: 24 mmol/L (ref 22–32)
Calcium: 9.5 mg/dL (ref 8.7–10.3)
Calcium: 9.6 mg/dL (ref 8.9–10.3)
Chloride: 103 mmol/L (ref 96–106)
Chloride: 107 mmol/L (ref 98–111)
Creatinine, Ser: 1.2 mg/dL — ABNORMAL HIGH (ref 0.57–1.00)
Creatinine, Ser: 1.31 mg/dL — ABNORMAL HIGH (ref 0.44–1.00)
GFR, Estimated: 42 mL/min — ABNORMAL LOW (ref >60)
Glucose, Bld: 103 mg/dL — ABNORMAL HIGH (ref 70–99)
Glucose: 122 mg/dL — ABNORMAL HIGH (ref 70–99)
Potassium: 4.9 mmol/L (ref 3.5–5.2)
Potassium: 4.4 mmol/L (ref 3.5–5.1)
Sodium: 140 mmol/L (ref 134–144)
Sodium: 139 mmol/L (ref 135–145)
eGFR: 47 mL/min/{1.73_m2} — ABNORMAL LOW (ref >59)

## 2021-12-03 LAB — SURGICAL PCR SCREEN
MRSA, PCR: NEGATIVE
Staphylococcus aureus: NEGATIVE

## 2021-12-03 LAB — POCT ACTIVATED CLOTTING TIME: Activated Clotting Time: 335 seconds

## 2021-12-03 SURGERY — LEFT ATRIAL APPENDAGE OCCLUSION
Anesthesia: General

## 2021-12-03 MED ORDER — OXYBUTYNIN CHLORIDE ER 5 MG PO TB24
5.0000 mg | ORAL_TABLET | Freq: Every day | ORAL | Status: DC
  Administered 2021-12-04: 5 mg via ORAL
  Filled 2021-12-03: qty 1

## 2021-12-03 MED ORDER — ACETAMINOPHEN 500 MG PO TABS: 1000.0000 mg | ORAL_TABLET | Freq: Once | ORAL | Status: DC

## 2021-12-03 MED ORDER — ROCURONIUM BROMIDE 10 MG/ML (PF) SYRINGE
PREFILLED_SYRINGE | INTRAVENOUS | Status: DC | PRN
  Administered 2021-12-03: 50 mg via INTRAVENOUS

## 2021-12-03 MED ORDER — PROPOFOL 10 MG/ML IV BOLUS
INTRAVENOUS | Status: DC | PRN
  Administered 2021-12-03: 100 mg via INTRAVENOUS

## 2021-12-03 MED ORDER — FENTANYL CITRATE (PF) 100 MCG/2ML IJ SOLN
12.5000 ug | Freq: Once | INTRAMUSCULAR | Status: AC
  Administered 2021-12-03: 12.5 ug via INTRAVENOUS

## 2021-12-03 MED ORDER — SODIUM CHLORIDE 0.9 % IV SOLN: INTRAVENOUS | Status: DC

## 2021-12-03 MED ORDER — HEPARIN SODIUM (PORCINE) 1000 UNIT/ML IJ SOLN
INTRAMUSCULAR | Status: DC | PRN
  Administered 2021-12-03: 11000 [IU] via INTRAVENOUS

## 2021-12-03 MED ORDER — FENTANYL CITRATE (PF) 100 MCG/2ML IJ SOLN
INTRAMUSCULAR | Status: AC
  Filled 2021-12-03: qty 2

## 2021-12-03 MED ORDER — SODIUM CHLORIDE 0.9% FLUSH: 3.0000 mL | INTRAVENOUS | Status: DC | PRN

## 2021-12-03 MED ORDER — PHENYLEPHRINE HCL-NACL 20-0.9 MG/250ML-% IV SOLN
INTRAVENOUS | Status: DC | PRN
  Administered 2021-12-03: 25 ug/min via INTRAVENOUS

## 2021-12-03 MED ORDER — SUGAMMADEX SODIUM 200 MG/2ML IV SOLN
INTRAVENOUS | Status: DC | PRN
  Administered 2021-12-03: 100 mg via INTRAVENOUS
  Administered 2021-12-03: 200 mg via INTRAVENOUS
  Administered 2021-12-03: 100 mg via INTRAVENOUS

## 2021-12-03 MED ORDER — SODIUM CHLORIDE 0.9% FLUSH
3.0000 mL | Freq: Two times a day (BID) | INTRAVENOUS | Status: DC
  Administered 2021-12-03 – 2021-12-04 (×2): 3 mL via INTRAVENOUS

## 2021-12-03 MED ORDER — VENLAFAXINE HCL 75 MG PO TABS
75.0000 mg | ORAL_TABLET | Freq: Two times a day (BID) | ORAL | Status: DC
  Administered 2021-12-03 – 2021-12-04 (×2): 75 mg via ORAL
  Filled 2021-12-03 (×3): qty 1

## 2021-12-03 MED ORDER — HEPARIN (PORCINE) IN NACL 1000-0.9 UT/500ML-% IV SOLN
INTRAVENOUS | Status: DC | PRN
  Administered 2021-12-03: 500 mL

## 2021-12-03 MED ORDER — HEPARIN (PORCINE) IN NACL 2000-0.9 UNIT/L-% IV SOLN
INTRAVENOUS | Status: AC
  Filled 2021-12-03: qty 1000

## 2021-12-03 MED ORDER — ONDANSETRON HCL 4 MG/2ML IJ SOLN: 4.0000 mg | Freq: Four times a day (QID) | INTRAMUSCULAR | Status: DC | PRN

## 2021-12-03 MED ORDER — SODIUM CHLORIDE 0.9 % IV SOLN: 250.0000 mL | INTRAVENOUS | Status: DC | PRN

## 2021-12-03 MED ORDER — ESMOLOL HCL 100 MG/10ML IV SOLN
INTRAVENOUS | Status: DC | PRN
  Administered 2021-12-03: 30 mg via INTRAVENOUS

## 2021-12-03 MED ORDER — DILTIAZEM HCL ER COATED BEADS 120 MG PO CP24
240.0000 mg | ORAL_CAPSULE | Freq: Every day | ORAL | Status: DC
  Administered 2021-12-03 – 2021-12-04 (×2): 240 mg via ORAL
  Filled 2021-12-03 (×2): qty 2

## 2021-12-03 MED ORDER — PHENYLEPHRINE 80 MCG/ML (10ML) SYRINGE FOR IV PUSH (FOR BLOOD PRESSURE SUPPORT)
PREFILLED_SYRINGE | INTRAVENOUS | Status: DC | PRN
  Administered 2021-12-03: 80 ug via INTRAVENOUS

## 2021-12-03 MED ORDER — LACTATED RINGERS IV SOLN: INTRAVENOUS | Status: DC

## 2021-12-03 MED ORDER — HYDROCODONE-ACETAMINOPHEN 5-325 MG PO TABS
1.0000 | ORAL_TABLET | ORAL | Status: DC | PRN
  Administered 2021-12-03: 1 via ORAL
  Filled 2021-12-03: qty 1

## 2021-12-03 MED ORDER — CHLORHEXIDINE GLUCONATE 4 % EX LIQD
Freq: Once | CUTANEOUS | Status: DC
  Filled 2021-12-03: qty 15

## 2021-12-03 MED ORDER — IOHEXOL 350 MG/ML SOLN
INTRAVENOUS | Status: DC | PRN
  Administered 2021-12-03 (×2): 10 mL

## 2021-12-03 MED ORDER — PANTOPRAZOLE SODIUM 40 MG PO TBEC
40.0000 mg | DELAYED_RELEASE_TABLET | Freq: Two times a day (BID) | ORAL | Status: DC
  Administered 2021-12-03 – 2021-12-04 (×2): 40 mg via ORAL
  Filled 2021-12-03 (×2): qty 1

## 2021-12-03 MED ORDER — LEVOTHYROXINE SODIUM 75 MCG PO TABS
75.0000 ug | ORAL_TABLET | Freq: Every day | ORAL | Status: DC
  Administered 2021-12-04: 75 ug via ORAL
  Filled 2021-12-03: qty 1

## 2021-12-03 MED ORDER — CHLORHEXIDINE GLUCONATE 0.12 % MT SOLN
15.0000 mL | Freq: Once | OROMUCOSAL | Status: AC
  Administered 2021-12-03: 15 mL via OROMUCOSAL
  Filled 2021-12-03 (×2): qty 15

## 2021-12-03 MED ORDER — PROTAMINE SULFATE 10 MG/ML IV SOLN
INTRAVENOUS | Status: DC | PRN
  Administered 2021-12-03: 30 mg via INTRAVENOUS

## 2021-12-03 MED ORDER — HEPARIN (PORCINE) IN NACL 1000-0.9 UT/500ML-% IV SOLN
INTRAVENOUS | Status: AC
  Filled 2021-12-03: qty 500

## 2021-12-03 MED ORDER — ATENOLOL 25 MG PO TABS
50.0000 mg | ORAL_TABLET | Freq: Every day | ORAL | Status: DC
  Administered 2021-12-04: 50 mg via ORAL
  Filled 2021-12-03: qty 2

## 2021-12-03 MED ORDER — LABETALOL HCL 5 MG/ML IV SOLN
INTRAVENOUS | Status: DC | PRN
  Administered 2021-12-03 (×2): 5 mg via INTRAVENOUS

## 2021-12-03 MED ORDER — HEPARIN (PORCINE) IN NACL 2000-0.9 UNIT/L-% IV SOLN
INTRAVENOUS | Status: DC | PRN
  Administered 2021-12-03: 1000 mL

## 2021-12-03 MED ORDER — DEXAMETHASONE SODIUM PHOSPHATE 10 MG/ML IJ SOLN
INTRAMUSCULAR | Status: DC | PRN
  Administered 2021-12-03: 5 mg via INTRAVENOUS

## 2021-12-03 MED ORDER — ACETAMINOPHEN 325 MG PO TABS
650.0000 mg | ORAL_TABLET | ORAL | Status: DC | PRN
  Administered 2021-12-04 (×2): 650 mg via ORAL
  Filled 2021-12-03 (×2): qty 2

## 2021-12-03 MED ORDER — CEFAZOLIN SODIUM-DEXTROSE 2-4 GM/100ML-% IV SOLN
2.0000 g | INTRAVENOUS | Status: AC
  Administered 2021-12-03: 2 g via INTRAVENOUS
  Filled 2021-12-03: qty 100

## 2021-12-03 MED ORDER — FENTANYL CITRATE (PF) 100 MCG/2ML IJ SOLN
INTRAMUSCULAR | Status: DC | PRN
  Administered 2021-12-03: 100 ug via INTRAVENOUS

## 2021-12-03 MED ORDER — APIXABAN 2.5 MG PO TABS
2.5000 mg | ORAL_TABLET | Freq: Two times a day (BID) | ORAL | Status: DC
  Administered 2021-12-03 – 2021-12-04 (×2): 2.5 mg via ORAL
  Filled 2021-12-03 (×2): qty 1

## 2021-12-03 MED ORDER — ONDANSETRON HCL 4 MG/2ML IJ SOLN
INTRAMUSCULAR | Status: DC | PRN
  Administered 2021-12-03: 4 mg via INTRAVENOUS

## 2021-12-03 SURGICAL SUPPLY — 19 items
CATH DIAG 6FR PIGTAIL ANGLED (CATHETERS) IMPLANT
CLOSURE PERCLOSE PROSTYLE (VASCULAR PRODUCTS) IMPLANT
DEVICE WATCHMAN FLX PROC (KITS) IMPLANT
DILATOR VESSEL 38 20CM 11FR (INTRODUCER) IMPLANT
KIT HEART LEFT (KITS) ×2 IMPLANT
KIT SHEA VERSACROSS LAAC CONNE (KITS) IMPLANT
PACK CARDIAC CATHETERIZATION (CUSTOM PROCEDURE TRAY) ×2 IMPLANT
PAD DEFIB RADIO PHYSIO CONN (PAD) ×2 IMPLANT
SHEATH PERFORMER 16FR 30 (SHEATH) IMPLANT
SHEATH PINNACLE 8F 10CM (SHEATH) IMPLANT
SHEATH PROBE COVER 6X72 (BAG) ×2 IMPLANT
SHIELD RADPAD SCOOP 12X17 (MISCELLANEOUS) ×2 IMPLANT
SYS WATCHMAN FXD DBL (SHEATH) ×2
SYSTEM WATCHMAN FXD DBL (SHEATH) IMPLANT
TRANSDUCER W/STOPCOCK (MISCELLANEOUS) ×2 IMPLANT
TUBING CIL FLEX 10 FLL-RA (TUBING) ×2 IMPLANT
WATCHMAN FLX 31 (Prosthesis & Implant Heart) IMPLANT
WATCHMAN FLX PROCEDURE DEVICE (KITS) ×1 IMPLANT
WATCHMAN PROCED TRUSEAL ACCESS (SHEATH) IMPLANT

## 2021-12-03 NOTE — Progress Notes (Signed)
   Pt c/o back pain, chronic problem, lately Tylenol has not been working. Does not have anything else at home.  Will give low-dose vicodin overnight, nursing staff to let pt know we cannot d/c on this.   Rosaria Ferries, PA-C 12/03/2021 8:01 PM

## 2021-12-03 NOTE — Progress Notes (Signed)
  [place] TEAM   Patient doing well s/p LAAO closure. He is hemodynamically stable. Groin site is stable. Plan for early ambulation after bedrest completed and hopeful discharge tomorrow morning.    Ellsworth Lennox RN, Student Nurse Practitioner  Structural Heart Team  Pager: [phone] Phone: [phone]

## 2021-12-03 NOTE — Interval H&P Note (Signed)
History and Physical Interval Note:  12/03/2021 2:44 PM  [name]  has presented today for surgery, with the diagnosis of Persistent afib.  The various methods of treatment have been discussed with the patient and family. After consideration of risks, benefits and other options for treatment, the patient has consented to  Procedure(s): LEFT ATRIAL APPENDAGE OCCLUSION (N/A) TRANSESOPHAGEAL ECHOCARDIOGRAM (TEE) (N/A) as a surgical intervention.  The patient's history has been reviewed, patient examined, no change in status, stable for surgery.  I have reviewed the patient's chart and labs.  Questions were answered to the patient's satisfaction.     [name]

## 2021-12-03 NOTE — Progress Notes (Signed)
Pt from cath lab. Right groin level 0 ,      12/03/21 1512  Vitals  BP 132/86  MAP (mmHg) 100  BP Location Left Arm  BP Method Automatic  Patient Position (if appropriate) Lying  Pulse Rate 92  Pulse Rate Source Monitor  ECG Heart Rate 97  Resp 13  Level of Consciousness  Level of Consciousness Alert  MEWS COLOR  MEWS Score Color Yellow  Oxygen Therapy  SpO2 93 %  O2 Device Room Air  O2 Flow Rate (L/min) 0 L/min  Pain Assessment  Pain Scale 0-10  Pain Score 8  PCA/Epidural/Spinal Assessment  Respiratory Pattern Regular  MEWS Score  MEWS Temp 1  MEWS Systolic 0  MEWS Pulse 0  MEWS RR 1  MEWS LOC 0  MEWS Score 2

## 2021-12-03 NOTE — Transfer of Care (Signed)
Immediate Anesthesia Transfer of Care Note  Patient: [name]  Procedure(s) Performed: LEFT ATRIAL APPENDAGE OCCLUSION TRANSESOPHAGEAL ECHOCARDIOGRAM (TEE)  Patient Location: Cath Lab  Anesthesia Type:General  Level of Consciousness: awake, alert , and oriented  Airway & Oxygen Therapy: Patient Spontanous Breathing and Patient connected to nasal cannula oxygen  Post-op Assessment: Report given to RN, Post -op Vital signs reviewed and stable, and Patient moving all extremities  Post vital signs: Reviewed and stable  Last Vitals:  Vitals Value Taken Time  BP    Temp    Pulse    Resp 20 12/03/21 1412  SpO2    Vitals shown include unvalidated device data.  Last Pain:  Vitals:   12/03/21 0945  TempSrc:   PainSc: 0-No pain         Complications: There were no known notable events for this encounter.

## 2021-12-03 NOTE — Discharge Summary (Addendum)
[facility]    Patient ID: [name],  MRN: [id number], DOB/AGE: [birth year] 76 y.o.  Admit date: 12/03/2021 Discharge date: 12/04/2021  Primary Care Physician: Lemmie Evens, MD  Primary Cardiologist: Rozann Lesches, MD  Electrophysiologist: None  Primary Discharge Diagnosis:  Persistent Atrial Fibrillation Poor candidacy for long term anticoagulation due to h/o GI bleeding  Secondary Discharge Diagnosis:  OSA, mild cognitive impairment, CKD II, iron deficiency anemia  Procedures This Admission:  Transeptal Puncture Intra-procedural TEE which showed no LAA thrombus Left atrial appendage occlusive device placement on 12/03/21 by Dr. Burt Knack.   Successful LAAO occlusion using a 31 mm Watchman FLX device   Recommend: Overnight observation Apixaban 2.5 mg BID x 6 weeks ASA 81 mg and Plavix 75 mg daily through 6 months Cardiac CTA for surveillance in 8 weeks   Brief HPI: [name] is a 76 year old female with a history of OSA, mild cognitive impairment, CKD II,  and persistent A-fib. She was deemed not a candidate for oral anticoagulation due to iron deficiency anemia and prior gastric bypass with anastomotic ulceration. She has a long history of chronic nausea, vomiting, and abdominal pain. In 2021 she was hospitalized for abdominal pain and the anastomotic ulcer as found at this time. Her iron deficiency anemia is felt to be malabsorption. She is followed by Dr. Domenic Polite for her primary care and he referred her to Dr. Burt Knack for evaluation of possible LAAO with Watchman device. In 04/2021 she was seen by GI who cleared her for short term anticoagulation but emphasized it was important that she avoid all NSAID products and she understand there is no way to prevent a future GI bleed.   Cardiac CT showed a Windsock LAA with no thrombus suitable for a 31 mm Watchman FLX.  Hospital Course:  The patient was admitted and underwent left atrial appendage occlusive  device placement with 68m Watchman FLX device. \She was monitored on telemetry overnight which demonstrated a-fib.  Groin site was without complication on the day of discharge. The patient was examined and considered to be stable for discharge.  Wound care and restrictions were reviewed with the patient. The patient has been scheduled for post procedure follow up with JKathyrn Drown NP in 1 month. Medication plan will be Apixaban 2.5 mg BID x 6 weeks ASA 81 mg and Plavix 75 mg daily through 6 months. A repeat cardiac CTA will be performed at 60 days to ensure proper seal of the device. She will need dental SBE for 6 months post implant, to be Rx'ed at follow up.   Due to her age and bedrest requirements she was kept overnight to avoid a very late discharge. Groin site is stable. She had come complaints of back pain overnight that was treated with vicodin. She will resume treatment with tylenol on discharge.   Physical Exam: Vitals:   12/03/21 2007 12/03/21 2353 12/04/21 0441 12/04/21 0822  BP: 120/83 (!) 88/65 117/83 105/85  Pulse: 96 68 89 87  Resp: (!) '24 20 17 18  '$ Temp: 97.6 F (36.4 C) 97.8 F (36.6 C) 98 F (36.7 C) 98 F (36.7 C)  TempSrc: Oral Oral Oral Oral  SpO2: 93% 93% 99% 98%  Weight:      Height:       General: Well developed, well nourished, NAD Neck: Negative for carotid bruits. No JVD Lungs:Clear to ausculation bilaterally. Breathing is unlabored. Cardiovascular: Irregularly irregular. No murmurs Extremities: No edema. Neuro: Alert and  oriented. No focal deficits. No facial asymmetry. MAE spontaneously. Psych: Responds to questions appropriately with normal affect.    Labs:   Lab Results  Component Value Date   WBC 8.6 11/13/2021   HGB 13.1 11/13/2021   HCT 38.9 11/13/2021   MCV 97 11/13/2021   PLT 193 11/13/2021    Recent Labs  Lab 12/04/21 0133  NA 138  K 4.8  CL 106  CO2 22  BUN 21  CREATININE 1.42*  CALCIUM 9.1  GLUCOSE 243*   Discharge  Medications:  Allergies as of 12/04/2021       Reactions   Benzodiazepines Other (See Comments)   Cloudy thinking, memory loss, withdrawal symptoms when trying to stop without any other medication for detox.    Codeine Nausea Only   Escitalopram Oxalate Nausea Only   Nsaids Other (See Comments)   Bleeding ulcer    Sertraline Hcl Nausea Only   Statins Nausea And Vomiting   Tramadol Hcl Other (See Comments)   Daughter doesn't want mother to take due to the fact that the medication made the daughter have a seizure         Medication List     TAKE these medications    apixaban 2.5 MG Tabs tablet Commonly known as: ELIQUIS Take 1 tablet (2.5 mg total) by mouth 2 (two) times daily. Start taking (1) tablet twice a day on 11/10   atenolol 50 MG tablet Commonly known as: TENORMIN TAKE 1 TABLET(50 MG) BY MOUTH DAILY   cyanocobalamin 1000 MCG/ML injection Commonly known as: VITAMIN B12 Inject 1,000 mcg into the muscle every 30 (thirty) days.   diltiazem 240 MG 24 hr capsule Commonly known as: CARDIZEM CD Take 240 mg by mouth daily.   estradiol 0.1 MG/GM vaginal cream Commonly known as: ESTRACE Place 1 Applicatorful vaginally in the morning and at bedtime.   levothyroxine 75 MCG tablet Commonly known as: SYNTHROID Take 75 mcg by mouth daily before breakfast.   oxybutynin 5 MG 24 hr tablet Commonly known as: DITROPAN-XL Take 5 mg by mouth daily.   pantoprazole 40 MG tablet Commonly known as: PROTONIX Take 1 tablet (40 mg total) by mouth 2 (two) times daily before a meal.   venlafaxine 75 MG tablet Commonly known as: EFFEXOR Take 75 mg by mouth 2 (two) times daily.   Vitamin D (Ergocalciferol) 1.25 MG (50000 UNIT) Caps capsule Commonly known as: DRISDOL Take 50,000 Units by mouth every Monday.        Disposition:  Home Discharge Instructions     Call MD for:  difficulty breathing, headache or visual disturbances   Complete by: As directed    Call MD for:   extreme fatigue   Complete by: As directed    Call MD for:  hives   Complete by: As directed    Call MD for:  persistant dizziness or light-headedness   Complete by: As directed    Call MD for:  persistant nausea and vomiting   Complete by: As directed    Call MD for:  redness, tenderness, or signs of infection (pain, swelling, redness, odor or green/yellow discharge around incision site)   Complete by: As directed    Call MD for:  severe uncontrolled pain   Complete by: As directed    Call MD for:  temperature >100.4   Complete by: As directed    Diet - low sodium heart healthy   Complete by: As directed    Discharge instructions   Complete  by: As directed    [facility] Procedure, Care After  Procedure MD: Dr. Armandina Stammer Clinical Coordinator: Lenice Llamas, RN  This sheet gives you information about how to care for yourself after your procedure. Your health care provider may also give you more specific instructions. If you have problems or questions, contact your health care provider.  What can I expect after the procedure? After the procedure, it is common to have: Bruising around your puncture site. Tenderness around your puncture site. Tiredness (fatigue).  Medication instructions It is very important to continue to take your blood thinner as directed by your doctor after the Watchman procedure. Call your procedure doctor's office with question or concerns. If you are on Coumadin (warfarin), you will have your INR checked the week after your procedure, with a goal INR of 2.0 - 3.0. Please follow your medication instructions on your discharge summary. Only take the medications listed on your discharge paperwork.  Follow up You will be seen in 1 month after your procedure You will have a repeat CT scan approximately 8 weeks after your procedure mark to check your device You will follow up the MD/APP who performed your procedure 6 months after your procedure The Watchman  Clinical Coordinator will check in with you from time to time, including 1 and 2 years after your procedure.    Follow these instructions at home: Puncture site care  Follow instructions from your health care provider about how to take care of your puncture site. Make sure you: If present, leave stitches (sutures), skin glue, or adhesive strips in place.  If a large square bandage is present, this may be removed 24 hours after surgery.  Check your puncture site every day for signs of infection. Check for: Redness, swelling, or pain. Fluid or blood. If your puncture site starts to bleed, lie down on your back, apply firm pressure to the area, and contact your health care provider. Warmth. Pus or a bad smell. Driving Do not drive yourself home if you received sedation Do not drive for at least 4 days after your procedure or however long your health care provider recommends. (Do not resume driving if you have previously been instructed not to drive for other health reasons.) Do not spend greater than 1 hour at a time in a car for the first 3 days. Stop and take a break with a 5 minute walk at least every hour.  Do not drive or use heavy machinery while taking prescription pain medicine.  Activity Avoid activities that take a lot of effort, including exercise, for at least 7 days after your procedure. For the first 3 days, avoid sitting for longer than one hour at a time.  Avoid alcoholic beverages, signing paperwork, or participating in legal proceedings for 24 hours after receiving sedation Do not lift anything that is heavier than 10 lb (4.5 kg) for one week.  No sexual activity for 1 week.  Return to your normal activities as told by your health care provider. Ask your health care provider what activities are safe for you. General instructions Take over-the-counter and prescription medicines only as told by your health care provider. Do not use any products that contain nicotine or  tobacco, such as cigarettes and e-cigarettes. If you need help quitting, ask your health care provider. You may shower after 24 hours, but Do not take baths, swim, or use a hot tub for 1 week.  Do not drink alcohol for 24 hours after your procedure. Keep  all follow-up visits as told by your health care provider. This is important. Dental Work: You will require antibiotics prior to any dental work, including cleanings, for 6 months after your Watchman implantation to help protect you from infection. After 6 months, antibiotics are no longer required. Contact a health care provider if: You have redness, mild swelling, or pain around your puncture site. You have soreness in your throat or at your puncture site that does not improve after several days You have fluid or blood coming from your puncture site that stops after applying firm pressure to the area. Your puncture site feels warm to the touch. You have pus or a bad smell coming from your puncture site. You have a fever. You have chest pain or discomfort that spreads to your neck, jaw, or arm. You are sweating a lot. You feel nauseous. You have a fast or irregular heartbeat. You have shortness of breath. You are dizzy or light-headed and feel the need to lie down. You have pain or numbness in the arm or leg closest to your puncture site. Get help right away if: Your puncture site suddenly swells. Your puncture site is bleeding and the bleeding does not stop after applying firm pressure to the area. These symptoms may represent a serious problem that is an emergency. Do not wait to see if the symptoms will go away. Get medical help right away. Call your local emergency services (911 in the [place] Do not drive yourself to the hospital. Summary After the procedure, it is normal to have bruising and tenderness at the puncture site in your groin, neck, or forearm. Check your puncture site every day for signs of infection. Get help right away  if your puncture site is bleeding and the bleeding does not stop after applying firm pressure to the area. This is a medical emergency.  This information is not intended to replace advice given to you by your health care provider. Make sure you discuss any questions you have with your health care provider.   Increase activity slowly   Complete by: As directed        Follow-up Information     Tommie Raymond, NP Follow up on 01/04/2022.   Specialty: Cardiology Why: @ 1030am. Please arrive at 10:15am. Contact information: [address] [place] [address] [phone]                 Duration of Discharge Encounter: Greater than 30 minutes including physician time.  Signed, Kathyrn Drown, NP, Student Nurse Practitioner  12/04/2021 10:14 AM   Patient seen, examined. Available data reviewed. Agree with findings, assessment, and plan as outlined by Kathyrn Drown, NP.  The patient is independently interviewed and examined.  She is alert, oriented, in no distress.  HEENT normal, lungs clear bilaterally, heart irregularly irregular but ventricular rate well controlled, abdomen soft nontender, right groin site clear, extremities without edema.  Telemetry reviewed shows atrial fibrillation in the 70s, no other significant arrhythmia.  The patient has done well with watchman implantation.  She is medically stable for discharge today.  Because of her high bleeding risk, she will be discharged on apixaban 2.5 mg twice daily.  [name], M.D. 12/04/2021 5:04 PM

## 2021-12-03 NOTE — Anesthesia Procedure Notes (Signed)
Procedure Name: Intubation Date/Time: 12/03/2021 12:56 PM  Performed by: Janene Harvey, CRNAPre-anesthesia Checklist: Patient identified, Emergency Drugs available, Suction available and Patient being monitored Patient Re-evaluated:Patient Re-evaluated prior to induction Oxygen Delivery Method: Circle system utilized Preoxygenation: Pre-oxygenation with 100% oxygen Induction Type: IV induction Ventilation: Mask ventilation without difficulty Laryngoscope Size: Mac and 4 Grade View: Grade I Tube type: Oral Tube size: 7.0 mm Number of attempts: 1 Airway Equipment and Method: Stylet and Oral airway Placement Confirmation: ETT inserted through vocal cords under direct vision, positive ETCO2 and breath sounds checked- equal and bilateral Secured at: 21 cm Tube secured with: Tape Dental Injury: Teeth and Oropharynx as per pre-operative assessment

## 2021-12-03 NOTE — Anesthesia Preprocedure Evaluation (Signed)
Anesthesia Evaluation  Patient identified by MRN, date of birth, ID band Patient awake    Reviewed: Allergy & Precautions, NPO status , Patient's Chart, lab work & pertinent test results  Airway Mallampati: II  TM Distance: >3 FB Neck ROM: Full    Dental   Pulmonary sleep apnea , former smoker   breath sounds clear to auscultation       Cardiovascular hypertension, Pt. on medications and Pt. on home beta blockers  Rhythm:Regular Rate:Normal     Neuro/Psych negative neurological ROS     GI/Hepatic Neg liver ROS, PUD,GERD  ,,  Endo/Other  Hypothyroidism    Renal/GU Renal disease     Musculoskeletal  (+) Arthritis ,    Abdominal   Peds  Hematology  (+) Blood dyscrasia, anemia   Anesthesia Other Findings   Reproductive/Obstetrics                              Lab Results  Component Value Date   WBC 8.6 11/13/2021   HGB 13.1 11/13/2021   HCT 38.9 11/13/2021   MCV 97 11/13/2021   PLT 193 11/13/2021   Lab Results  Component Value Date   CREATININE 1.31 (H) 12/03/2021   BUN 21 12/03/2021   NA 139 12/03/2021   K 4.4 12/03/2021   CL 107 12/03/2021   CO2 24 12/03/2021    Anesthesia Physical Anesthesia Plan  ASA: 3  Anesthesia Plan: General   Post-op Pain Management: Tylenol PO (pre-op)*   Induction: Intravenous  PONV Risk Score and Plan: 3 and Dexamethasone, Ondansetron and Treatment may vary due to age or medical condition  Airway Management Planned: Oral ETT  Additional Equipment: ClearSight  Intra-op Plan:   Post-operative Plan: Extubation in OR  Informed Consent: I have reviewed the patients History and Physical, chart, labs and discussed the procedure including the risks, benefits and alternatives for the proposed anesthesia with the patient or authorized representative who has indicated his/her understanding and acceptance.     Dental advisory given  Plan  Discussed with: CRNA  Anesthesia Plan Comments:          Anesthesia Quick Evaluation

## 2021-12-03 NOTE — Anesthesia Postprocedure Evaluation (Signed)
Anesthesia Post Note  Patient: [name]  Procedure(s) Performed: LEFT ATRIAL APPENDAGE OCCLUSION TRANSESOPHAGEAL ECHOCARDIOGRAM (TEE)     Anesthesia Type: General Anesthetic complications: no   There were no known notable events for this encounter.  Last Vitals:  Vitals:   12/03/21 1445 12/03/21 1512  BP: 134/74 132/86  Pulse: (!) 107 92  Resp: (!) 21 13  Temp: (!) 35.8 C (!) 36.1 C  SpO2: 96% 93%    Last Pain:  Vitals:   12/03/21 1512  TempSrc: Tympanic  PainSc: 8                  Tiajuana Amass

## 2021-12-04 ENCOUNTER — Encounter (HOSPITAL_COMMUNITY): Payer: Self-pay | Admitting: Cardiovascular Disease

## 2021-12-04 DIAGNOSIS — K909 Intestinal malabsorption, unspecified: Secondary | ICD-10-CM | POA: Diagnosis not present

## 2021-12-04 DIAGNOSIS — I4819 Other persistent atrial fibrillation: Secondary | ICD-10-CM | POA: Diagnosis not present

## 2021-12-04 DIAGNOSIS — Z006 Encounter for examination for normal comparison and control in clinical research program: Secondary | ICD-10-CM | POA: Diagnosis not present

## 2021-12-04 DIAGNOSIS — N1831 Chronic kidney disease, stage 3a: Secondary | ICD-10-CM | POA: Diagnosis not present

## 2021-12-04 LAB — BASIC METABOLIC PANEL
Anion gap: 10 (ref 5–15)
BUN: 21 mg/dL (ref 8–23)
CO2: 22 mmol/L (ref 22–32)
Calcium: 9.1 mg/dL (ref 8.9–10.3)
Chloride: 106 mmol/L (ref 98–111)
Creatinine, Ser: 1.42 mg/dL — ABNORMAL HIGH (ref 0.44–1.00)
GFR, Estimated: 39 mL/min — ABNORMAL LOW (ref >60)
Glucose, Bld: 243 mg/dL — ABNORMAL HIGH (ref 70–99)
Potassium: 4.8 mmol/L (ref 3.5–5.1)
Sodium: 138 mmol/L (ref 135–145)

## 2021-12-04 NOTE — Progress Notes (Signed)
Nursing Dc note  Dc instructions reviewed with patients daughter [name] over the phone. She verbalized understanding. DC lounge [place] number given to crystal she will call as soon as she arrives at the main entrance. [facility] notified of dc order. All belongings  and dc papers given to patient

## 2021-12-04 NOTE — Discharge Instructions (Signed)
WATCHMANT Procedure, Care After  Procedure MD: Dr. Cooper Watchman Clinical Coordinator: Katy Kemp, RN  This sheet gives you information about how to care for yourself after your procedure. Your health care provider may also give you more specific instructions. If you have problems or questions, contact your health care provider.  What can I expect after the procedure? After the procedure, it is common to have: Bruising around your puncture site. Tenderness around your puncture site. Tiredness (fatigue).  Medication instructions It is very important to continue to take your blood thinner as directed by your doctor after the Watchman procedure. Call your procedure doctor's office with question or concerns. If you are on Coumadin (warfarin), you will have your INR checked the week after your procedure, with a goal INR of 2.0 - 3.0. Please follow your medication instructions on your discharge summary. Only take the medications listed on your discharge paperwork.  Follow up You will be seen in 1 month after your procedure You will have a repeat CT scan approximately 8 weeks after your procedure mark to check your device You will follow up the MD/APP who performed your procedure 6 months after your procedure The Watchman Clinical Coordinator will check in with you from time to time, including 1 and 2 years after your procedure.    Follow these instructions at home: Puncture site care  Follow instructions from your health care provider about how to take care of your puncture site. Make sure you: If present, leave stitches (sutures), skin glue, or adhesive strips in place.  If a large square bandage is present, this may be removed 24 hours after surgery.  Check your puncture site every day for signs of infection. Check for: Redness, swelling, or pain. Fluid or blood. If your puncture site starts to bleed, lie down on your back, apply firm pressure to the area, and contact your health care  provider. Warmth. Pus or a bad smell. Driving Do not drive yourself home if you received sedation Do not drive for at least 4 days after your procedure or however long your health care provider recommends. (Do not resume driving if you have previously been instructed not to drive for other health reasons.) Do not spend greater than 1 hour at a time in a car for the first 3 days. Stop and take a break with a 5 minute walk at least every hour.  Do not drive or use heavy machinery while taking prescription pain medicine.  Activity Avoid activities that take a lot of effort, including exercise, for at least 7 days after your procedure. For the first 3 days, avoid sitting for longer than one hour at a time.  Avoid alcoholic beverages, signing paperwork, or participating in legal proceedings for 24 hours after receiving sedation Do not lift anything that is heavier than 10 lb (4.5 kg) for one week.  No sexual activity for 1 week.  Return to your normal activities as told by your health care provider. Ask your health care provider what activities are safe for you. General instructions Take over-the-counter and prescription medicines only as told by your health care provider. Do not use any products that contain nicotine or tobacco, such as cigarettes and e-cigarettes. If you need help quitting, ask your health care provider. You may shower after 24 hours, but Do not take baths, swim, or use a hot tub for 1 week.  Do not drink alcohol for 24 hours after your procedure. Keep all follow-up visits as told by your   health care provider. This is important. Dental Work: You will require antibiotics prior to any dental work, including cleanings, for 6 months after your Watchman implantation to help protect you from infection. After 6 months, antibiotics are no longer required. Contact a health care provider if: You have redness, mild swelling, or pain around your puncture site. You have soreness in your  throat or at your puncture site that does not improve after several days You have fluid or blood coming from your puncture site that stops after applying firm pressure to the area. Your puncture site feels warm to the touch. You have pus or a bad smell coming from your puncture site. You have a fever. You have chest pain or discomfort that spreads to your neck, jaw, or arm. You are sweating a lot. You feel nauseous. You have a fast or irregular heartbeat. You have shortness of breath. You are dizzy or light-headed and feel the need to lie down. You have pain or numbness in the arm or leg closest to your puncture site. Get help right away if: Your puncture site suddenly swells. Your puncture site is bleeding and the bleeding does not stop after applying firm pressure to the area. These symptoms may represent a serious problem that is an emergency. Do not wait to see if the symptoms will go away. Get medical help right away. Call your local emergency services (911 in the [place] Do not drive yourself to the hospital. Summary After the procedure, it is normal to have bruising and tenderness at the puncture site in your groin, neck, or forearm. Check your puncture site every day for signs of infection. Get help right away if your puncture site is bleeding and the bleeding does not stop after applying firm pressure to the area. This is a medical emergency.  This information is not intended to replace advice given to you by your health care provider. Make sure you discuss any questions you have with your health care provider.   

## 2021-12-07 ENCOUNTER — Telehealth: Payer: Self-pay | Admitting: Cardiology

## 2021-12-07 DIAGNOSIS — I4819 Other persistent atrial fibrillation: Secondary | ICD-10-CM

## 2021-12-07 DIAGNOSIS — Z95818 Presence of other cardiac implants and grafts: Secondary | ICD-10-CM

## 2021-12-07 DIAGNOSIS — Z8719 Personal history of other diseases of the digestive system: Secondary | ICD-10-CM

## 2021-12-07 NOTE — Telephone Encounter (Signed)
  [place] VALVE TEAM   Patient contacted regarding discharge from [facility] on 12/04/21   Patient understands to follow up with provider Kathyrn Drown on 01/04/22 at the [place] office.  Patient understands discharge instructions? Yes  Patient understands medications and regimen? Yes  Patient understands to bring all medications to this visit? Yes   Kathyrn Drown NP-C Structural Heart Team  Pager: [phone]

## 2021-12-07 NOTE — Addendum Note (Signed)
Addended by: Harland German A on: 12/07/2021 12:09 PM   Modules accepted: Orders

## 2021-12-08 ENCOUNTER — Ambulatory Visit: Payer: PPO | Attending: Student | Admitting: Student

## 2021-12-08 ENCOUNTER — Encounter: Payer: Self-pay | Admitting: Student

## 2021-12-08 VITALS — BP 138/86 | HR 88 | Ht 65.0 in | Wt 169.6 lb

## 2021-12-08 DIAGNOSIS — I4819 Other persistent atrial fibrillation: Secondary | ICD-10-CM | POA: Diagnosis not present

## 2021-12-08 DIAGNOSIS — Z95818 Presence of other cardiac implants and grafts: Secondary | ICD-10-CM

## 2021-12-08 DIAGNOSIS — D649 Anemia, unspecified: Secondary | ICD-10-CM | POA: Diagnosis not present

## 2021-12-08 DIAGNOSIS — R0789 Other chest pain: Secondary | ICD-10-CM | POA: Diagnosis not present

## 2021-12-08 DIAGNOSIS — R42 Dizziness and giddiness: Secondary | ICD-10-CM

## 2021-12-08 NOTE — Patient Instructions (Signed)
Medication Instructions:  Your physician recommends that you continue on your current medications as directed. Please refer to the Current Medication list given to you today.  *If you need a refill on your cardiac medications before your next appointment, please call your pharmacy*   Lab Work: NONE   If you have labs (blood work) drawn today and your tests are completely normal, you will receive your results only by: [place] (if you have MyChart) OR A paper copy in the mail If you have any lab test that is abnormal or we need to change your treatment, we will call you to review the results.   Testing/Procedures: NONE    Follow-Up: At [facility], you and your health needs are our priority.  As part of our continuing mission to provide you with exceptional heart care, we have created designated Provider Care Teams.  These Care Teams include your primary Cardiologist (physician) and Advanced Practice Providers (APPs -  Physician Assistants and Nurse Practitioners) who all work together to provide you with the care you need, when you need it.  We recommend signing up for the patient portal called "MyChart".  Sign up information is provided on this After Visit Summary.  MyChart is used to connect with patients for Virtual Visits (Telemedicine).  Patients are able to view lab/test results, encounter notes, upcoming appointments, etc.  Non-urgent messages can be sent to your provider as well.   To learn more about what you can do with MyChart, go to [email or web address].    Your next appointment:   6 month(s)  The format for your next appointment:   In Person  Provider:   Rozann Lesches, MD    Other Instructions Thank you for choosing [facility]!    Important Information About Sugar

## 2021-12-08 NOTE — Progress Notes (Signed)
Cardiology Office Note    Date:  12/08/2021   ID:  [name], [name] 24-May-1945, MRN [id number]  PCP:  Lemmie Evens, MD  Cardiologist: Rozann Lesches, MD    Chief Complaint  Patient presents with   Follow-up    3 month visit    History of Present Illness:    [name] is a 76 y.o. female with past medical history of persistent atrial fibrillation, chronic back pain, hypothyroidism and anemia (in the setting of iron deficiency anemia, PUD and prior GI bleed) who presents to the office today for 53-monthfollow-up.  She was examined by Dr. MDomenic Politein 05/2021 and reported her heart rate had overall been well-controlled following dose adjustment of Cardizem CD to 240 mg daily. Given her prior intolerance to anticoagulation, [facility]. She had previously been cleared by GI to be on short-term anticoagulation. She did meet with Dr. CBurt Knackin 05/2021 and ultimately underwent work-up for Watchman device placement.  She was recently admitted for this from 11/16 - 12/04/2021 and underwent left atrial appendage occlusive device placement with a 31 mm Watchman FLX device. No immediate complications were noted and she overall tolerated the procedure well. It was recommended that she be on Eliquis 2.5 mg twice daily for 6 weeks and then ASA 81 mg daily and Plavix 75 mg daily through 6 months. She does have plans for a follow-up Cardiac CTA in 60 days to ensure proper seal of the device.  In talking with the patient and her daughter today, she reports overall doing well since her recent hospitalization. No complications regarding her groin site. She remains on Eliquis for anticoagulation with no recent melena, hematochezia or hematuria. She does report having occasional episodic chest pain which occurs at rest and says this has actually decreased in frequency and severity since her procedure. No specific exertional  chest pain. No recent orthopnea, PND or pitting edema. She does have a prescription for PRN Lasix but has not utilized this routinely. She reports having intermittent dizziness which she thinks is possibly vertigo as the room spins while sitting still. This is not associated with positional changes.  Reports intermittent tinnitus as well. Also reports having intermittent nausea which has been occurring for 6-12+ months and was previously being followed by GI. No progression of symptoms.   Past Medical History:  Diagnosis Date   Anemia    Anxiety    Arthritis    Chronic back pain    Spondylosis and stenosis   Chronic kidney disease, stage 3a (HCC)    Depression    GERD (gastroesophageal reflux disease)    Headache(784.0)    Hiatal hernia    History of blood transfusion    History of GI bleed    Hypothyroidism    Mitral regurgitation    Nephrolithiasis    Obsessive-compulsive disorder    Persistent atrial fibrillation ([place]    Presence of Watchman left atrial appendage closure device 12/03/2021   35mWatchman FLX with Dr. CoBurt Knack Recurrent upper respiratory infection (URI)    Sleep apnea     Past Surgical History:  Procedure Laterality Date   ABDOMINAL HYSTERECTOMY     ANTERIOR CERVICAL DECOMP/DISCECTOMY FUSION  12/09/2010   Procedure: ANTERIOR CERVICAL DECOMPRESSION/DISCECTOMY FUSION 3 LEVELS;  Surgeon: JeOphelia Charter [facility];  Service: Neurosurgery;  Laterality: N/A;  Cervical three-four,Cervical four-five,Cervical Five-Six,Cervical Six-Seven ANTERIOR CERVICAL DECOMPRESSION WITH FUSION  INTERBODY PROTHESIS PLATING AND BONEGRAFT   APPENDECTOMY     BIOPSY N/A 11/14/2014   Procedure: GASTRIC BIOPSY;  Surgeon: Daneil Dolin, MD;  Location: [facility];  Service: Endoscopy;  Laterality: N/A;   CESAREAN SECTION     CHOLECYSTECTOMY     COLONOSCOPY WITH PROPOFOL N/A 11/14/2014   RMR: redundant colon but otherwise normal   COLONOSCOPY WITH PROPOFOL N/A 05/12/2020   Dr.  Gala Romney;  Seven 4 to 7 mm polyps, diverticulosis in the sigmoid colon. Pathology with tubular adenomas. Recommended repeat in 3 years.   ESOPHAGOGASTRODUODENOSCOPY (EGD) WITH PROPOFOL N/A 11/14/2014   RMR"s/p gastric surgery/anastomtic ulcer   ESOPHAGOGASTRODUODENOSCOPY (EGD) WITH PROPOFOL N/A 09/15/2017   Procedure: ESOPHAGOGASTRODUODENOSCOPY (EGD) WITH PROPOFOL;  Surgeon: Daneil Dolin, MD;  [facility];  Service: Endoscopy;  Laterality: N/A;  11:30am   ESOPHAGOGASTRODUODENOSCOPY (EGD) WITH PROPOFOL N/A 04/04/2019   2 cm ulcer crater at anastomosis with smaller satellite areas of ulceration in setting of NSAIDs/Goody powders, no bleeding stigmata.   ESOPHAGOGASTRODUODENOSCOPY (EGD) WITH PROPOFOL N/A 06/13/2019   [place]k: Prior gastric bypass surgery with Billroth II type anatomy, otherwise normal-appearing residual upper GI tract.   EYE SURGERY     bilateral cataracts removed, w/IOL   GASTRIC BYPASS  2008   JOINT REPLACEMENT     07/2010 &  2002- respectively- both knees    LEFT ATRIAL APPENDAGE OCCLUSION N/A 12/03/2021   Procedure: LEFT ATRIAL APPENDAGE OCCLUSION;  Surgeon: [name], MD;  Location: [facility];  Service: Cardiovascular;  Laterality: N/A;   MALONEY DILATION N/A 09/15/2017   Procedure: Venia Minks DILATION;  Surgeon: Daneil Dolin, MD;  [facility];  Service: Endoscopy;  Laterality: N/A;   PARATHYROIDECTOMY     PERIPHERALLY INSERTED CENTRAL CATHETER INSERTION     PICC line for treatment of MRSA   POLYPECTOMY  05/12/2020   Procedure: POLYPECTOMY;  Surgeon: Daneil Dolin, MD;  [facility];  Service: Endoscopy;;   TEE WITHOUT CARDIOVERSION N/A 12/03/2021   Procedure: TRANSESOPHAGEAL ECHOCARDIOGRAM (TEE);  Surgeon: [name], MD;  Location: [facility];  Service: Cardiovascular;  Laterality: N/A;   TONSILLECTOMY     as a child    Current Medications: Outpatient Medications Prior to Visit  Medication Sig Dispense  Refill   apixaban (ELIQUIS) 2.5 MG TABS tablet Take 1 tablet (2.5 mg total) by mouth 2 (two) times daily. Start taking (1) tablet twice a day on 11/10 60 tablet 1   atenolol (TENORMIN) 50 MG tablet TAKE 1 TABLET(50 MG) BY MOUTH DAILY 90 tablet 3   cyanocobalamin (VITAMIN B12) 1000 MCG/ML injection Inject 1,000 mcg into the muscle every 30 (thirty) days.     D3-50 1.25 MG (50000 UT) capsule Take 50,000 Units by mouth once a week.     diltiazem (CARDIZEM CD) 240 MG 24 hr capsule Take 240 mg by mouth daily.     estradiol (ESTRACE) 0.1 MG/GM vaginal cream Place 1 Applicatorful vaginally in the morning and at bedtime.     levothyroxine (SYNTHROID, LEVOTHROID) 75 MCG tablet Take 75 mcg by mouth daily before breakfast.     oxybutynin (DITROPAN-XL) 5 MG 24 hr tablet Take 5 mg by mouth daily.     pantoprazole (PROTONIX) 40 MG tablet Take 1 tablet (40 mg total) by mouth 2 (two) times daily before a meal. 60 tablet 3   venlafaxine (EFFEXOR) 75 MG tablet Take 75 mg by mouth 2 (two) times daily.  Vitamin D, Ergocalciferol, (DRISDOL) 50000 units CAPS capsule Take 50,000 Units by mouth every Monday.     No facility-administered medications prior to visit.     Allergies:   Benzodiazepines, Codeine, Escitalopram oxalate, Nsaids, Sertraline hcl, Statins, and Tramadol hcl   Social History   Socioeconomic History   Marital status: Widowed    Spouse name: Not on file   Number of children: 4   Years of education: Not on file   Highest education level: Not on file  Occupational History   Not on file  Tobacco Use   Smoking status: Former    Packs/day: 1.00    Years: 30.00    Total pack years: 30.00    Types: Cigarettes    Quit date: 01/18/1998    Years since quitting: 23.9   Smokeless tobacco: Never  Vaping Use   Vaping Use: Never used  Substance and Sexual Activity   Alcohol use: No   Drug use: No   Sexual activity: Not on file    Comment: Hysterectomy  Other Topics Concern   Not on file   Social History Narrative   Not on file   Social Determinants of Health   Financial Resource Strain: Not on file  Food Insecurity: Not on file  Transportation Needs: Not on file  Physical Activity: Not on file  Stress: Not on file  Social Connections: Not on file     Family History:  The patient's family history includes ADD / ADHD in her daughter; Anxiety disorder in her mother and sister; Bipolar disorder in her daughter; Breast cancer in her mother and sister; Heart attack in her father; OCD in her daughter.   Review of Systems:    Please see the history of present illness.     All other systems reviewed and are otherwise negative except as noted above.   Physical Exam:    VS:  BP 138/86   Pulse 88   Ht '5\' 5"'$  (1.651 m)   Wt 169 lb 9.6 oz (76.9 kg)   SpO2 100%   BMI 28.22 kg/m    General: Pleasant female appearing in no acute distress. Head: Normocephalic, atraumatic. Neck: No carotid bruits. JVD not elevated.  Lungs: Respirations regular and unlabored, without wheezes or rales.  Heart: Irregularly irregular.  No S3 or S4.  No murmur, no rubs, or gallops appreciated. Abdomen: Appears non-distended. No obvious abdominal masses. Msk:  Strength and tone appear normal for age. No obvious joint deformities or effusions. Extremities: No clubbing or cyanosis. No pitting edema.  Distal pedal pulses are 2+ bilaterally. Groin site without ecchymosis or evidence of a hematoma.  Neuro: Alert and oriented X 3. Moves all extremities spontaneously. No focal deficits noted. Psych:  Responds to questions appropriately with a normal affect. Skin: No rashes or lesions noted  Wt Readings from Last 3 Encounters:  12/08/21 169 lb 9.6 oz (76.9 kg)  12/03/21 169 lb 12.1 oz (77 kg)  11/13/21 168 lb (76.2 kg)     Studies/Labs Reviewed:   EKG:  EKG is ordered today. The ekg ordered today demonstrates rate-controlled atrial fibrillation, HR 75. TWI along lateral leads but actually improved  when compared to prior tracings.   Recent Labs: 03/19/2021: TSH 3.520 07/12/2021: ALT 13 11/13/2021: Hemoglobin 13.1; Platelets 193 12/04/2021: BUN 21; Creatinine, Ser 1.42; Potassium 4.8; Sodium 138   Lipid Panel No results found for: "CHOL", "TRIG", "HDL", "CHOLHDL", "VLDL", "LDLCALC", "LDLDIRECT"  Additional studies/ records that were reviewed today include:   Cardiac  CT: 07/2021 IMPRESSION: 1.  Severe LAE, moderate RAE   2.  No PFO/ASD   3.  [place] LAA with no thrombus   4. Landing Zone 24.1 mm with depth 28.5 suitable for a 31 mm Watchman FLX   5.  Working angle LAO 2 Caudal 5 degrees   6. No pericardial effusion Heart shifted to left which may make TEE imaging challenging   7.  Normal ascending thoracic aorta 3.7 cm   8.  Calcium score 302 which is 80 th percentile for age/sex  TEE: 11/2021 IMPRESSIONS     1. TEE guided LAA closure. 31 mm Watchman FLX implanted with 19%  compression. No device leak. Small iatrogenic ASD with L to R shunting. No  immediate complications.   2. Left ventricular ejection fraction, by estimation, is 55 to 60%. The  left ventricle has normal function.   3. Right ventricular systolic function is normal. The right ventricular  size is normal.   4. Left atrial size was severely dilated. No left atrial/left atrial  appendage thrombus was detected.   5. Right atrial size was severely dilated.   6. The mitral valve is abnormal. Moderate to severe mitral valve  regurgitation.   7. The aortic valve is tricuspid. There is moderate calcification of the  aortic valve. There is moderate thickening of the aortic valve. Aortic  valve regurgitation is trivial. Aortic valve sclerosis/calcification is  present, without any evidence of  aortic stenosis.   8. TEE guided left atrial appendage closure.   Assessment:    1. Persistent atrial fibrillation ([place])   2. Presence of Watchman left atrial appendage closure device   3. Anemia, unspecified  type   4. Atypical chest pain   5. Vertigo      Plan:   In order of problems listed above:  1. Persistent Atrial Fibrillation/Recent Watchman Device Placement - She reports occasional, brief palpitations but no persistent symptoms. Heart rate is well controlled in the 80's today and I encouraged her to check her heart rate at home when this occurs. Continue current medical therapy for rate control with Atenolol 50 mg daily and Cardizem CD 240 mg daily. - She recently underwent left atrial appendage occlusive device placement with a 31 mm Watchman FLX device on 12/03/2021. She is to remain on Eliquis 2.5 mg twice daily for 6 weeks and then will stop this and will be on ASA and Plavix through 6 months. She does have previously scheduled follow-up with the Structural Heart Team on 01/04/2022.  2. Anemia - Hgb was stable at 13.1 by recent labs in 10/2021. Remains on Eliquis and tolerating well at this time with no reports of active bleeding.   3. Atypical Chest Pain - She reports occasional episodes of chest discomfort but says these occur at rest and spontaneously resolve. EKG today shows slight TWI along the lateral leads but this has improved when compared to prior tracings. She did have coronary calcification by prior CT in 07/2021. Will request a copy of most recent labs from her PCP to see if she should be on statin therapy (previously intolerant by review of notes given nausea and vomiting so may need to consider low-dose Crestor or Zetia).  4. Vertigo - She reports episodes of the room spinning which occurs at rest and can precipitate her nausea. No association with positional changes. Given this along with her associated tinnitus, will plan for referral to ENT.   Medication Adjustments/Labs and Tests Ordered: Current medicines are reviewed  at length with the patient today.  Concerns regarding medicines are outlined above.  Medication changes, Labs and Tests ordered today are listed in  the Patient Instructions below. Patient Instructions  Medication Instructions:  Your physician recommends that you continue on your current medications as directed. Please refer to the Current Medication list given to you today.  *If you need a refill on your cardiac medications before your next appointment, please call your pharmacy*   Lab Work: NONE   If you have labs (blood work) drawn today and your tests are completely normal, you will receive your results only by: [place] (if you have MyChart) OR A paper copy in the mail If you have any lab test that is abnormal or we need to change your treatment, we will call you to review the results.   Testing/Procedures: NONE    Follow-Up: At [facility], you and your health needs are our priority.  As part of our continuing mission to provide you with exceptional heart care, we have created designated Provider Care Teams.  These Care Teams include your primary Cardiologist (physician) and Advanced Practice Providers (APPs -  Physician Assistants and Nurse Practitioners) who all work together to provide you with the care you need, when you need it.  We recommend signing up for the patient portal called "MyChart".  Sign up information is provided on this After Visit Summary.  MyChart is used to connect with patients for Virtual Visits (Telemedicine).  Patients are able to view lab/test results, encounter notes, upcoming appointments, etc.  Non-urgent messages can be sent to your provider as well.   To learn more about what you can do with MyChart, go to [email or web address].    Your next appointment:   6 month(s)  The format for your next appointment:   In Person  Provider:   Rozann Lesches, MD    Other Instructions Thank you for choosing [facility]!    Important Information About [place]         Signed, Erma Heritage, PA-C  12/08/2021 5:22 PM    [facility] [address] [place], [place] [address] Phone: [phone] Fax: [phone]

## 2021-12-30 ENCOUNTER — Other Ambulatory Visit (HOSPITAL_COMMUNITY): Payer: Self-pay | Admitting: Family Medicine

## 2021-12-30 DIAGNOSIS — Z87442 Personal history of urinary calculi: Secondary | ICD-10-CM

## 2021-12-31 NOTE — Progress Notes (Deleted)
[facility]                                     Cardiology Office Note:    Date:  12/31/2021   ID:  [name], DOB [birth year], MRN [id number]  PCP:  Lemmie Evens, MD  [facility] Cardiologist:  Rozann Lesches, MD  [place] Electrophysiologist:  None   Referring MD: Lemmie Evens, MD   No chief complaint on file. ***  History of Present Illness:    [name] is a 76 y.o. female with a hx of OSA, mild cognitive impairment, CKD II,  and persistent A-fib. She was deemed not a candidate for oral anticoagulation due to iron deficiency anemia and prior gastric bypass with anastomotic ulceration. She has a long history of chronic nausea, vomiting, and abdominal pain. In 2021 she was hospitalized for abdominal pain and the anastomotic ulcer as found at this time. Her iron deficiency anemia is felt to be malabsorption. She is followed by Dr. Domenic Polite for her primary care and he referred her to Dr. Burt Knack for evaluation of possible LAAO with Watchman device. In 04/2021 she was seen by GI who cleared her for short term anticoagulation but emphasized it was important that she avoid all NSAID products and she understand there is no way to prevent a future GI bleed.    Cardiac CT showed a Windsock LAA with no thrombus suitable for a 31 mm Watchman FLX.   Hospital Course:  The patient was admitted and underwent left atrial appendage occlusive device placement with 61m Watchman FLX device. \She was monitored on telemetry overnight which demonstrated a-fib.  Groin site was without complication on the day of discharge. The patient was examined and considered to be stable for discharge.  Wound care and restrictions were reviewed with the patient. The patient has been scheduled for post procedure follow up with JKathyrn Drown NP in 1 month. Medication plan will be Apixaban 2.5 mg BID x 6 weeks ASA 81 mg and Plavix 75 mg daily through 6 months. A repeat cardiac CTA will be  performed at 60 days to ensure proper seal of the device. She will need dental SBE for 6 months post implant, to be Rx'ed at follow up.    Due to her age and bedrest requirements she was kept overnight to avoid a very late discharge. Groin site is stable. She had come complaints of back pain overnight that was treated with vicodin. She will resume treatment with tylenol on discharge.   Seen by BBernerd Pho PA 12/08/21 and was doing well with no acute issues.   She does report having occasional episodic chest pain which occurs at rest and says this has actually decreased in frequency and severity since her procedure. No specific exertional chest pain. No recent orthopnea, PND or pitting edema. She does have a prescription for PRN Lasix but has not utilized this routinely. She reports having intermittent dizziness which she thinks is possibly vertigo as the room spins while sitting still. This is not associated with positional changes.  Reports intermittent tinnitus as well. Also reports having intermittent nausea which has been occurring for 6-12+ months and was previously being followed by GI. No progression of symptoms.    1. Persistent Atrial Fibrillation/Recent Watchman Device Placement - She reports occasional, brief palpitations but no persistent symptoms. Heart rate is well controlled in the 80's today and  I encouraged her to check her heart rate at home when this occurs. Continue current medical therapy for rate control with Atenolol 50 mg daily and Cardizem CD 240 mg daily. - She recently underwent left atrial appendage occlusive device placement with a 31 mm Watchman FLX device on 12/03/2021. She is to remain on Eliquis 2.5 mg twice daily for 6 weeks and then will stop this and will be on ASA and Plavix through 6 months. She does have previously scheduled follow-up with the Structural Heart Team on 01/04/2022.   2. Anemia - Hgb was stable at 13.1 by recent labs in 10/2021. Remains on  Eliquis and tolerating well at this time with no reports of active bleeding.    3. Atypical Chest Pain - She reports occasional episodes of chest discomfort but says these occur at rest and spontaneously resolve. EKG today shows slight TWI along the lateral leads but this has improved when compared to prior tracings. She did have coronary calcification by prior CT in 07/2021. Will request a copy of most recent labs from her PCP to see if she should be on statin therapy (previously intolerant by review of notes given nausea and vomiting so may need to consider low-dose Crestor or Zetia).   4. Vertigo - She reports episodes of the room spinning which occurs at rest and can precipitate her nausea. No association with positional changes. Given this along with her associated tinnitus, will plan for referral to ENT.        Past Medical History:  Diagnosis Date   Anemia    Anxiety    Arthritis    Chronic back pain    Spondylosis and stenosis   Chronic kidney disease, stage 3a (HCC)    Depression    GERD (gastroesophageal reflux disease)    Headache(784.0)    Hiatal hernia    History of blood transfusion    History of GI bleed    Hypothyroidism    Mitral regurgitation    Nephrolithiasis    Obsessive-compulsive disorder    Persistent atrial fibrillation ([place])    Presence of Watchman left atrial appendage closure device 12/03/2021   53m Watchman FLX with Dr. CBurt Knack  Recurrent upper respiratory infection (URI)    Sleep apnea     Past Surgical History:  Procedure Laterality Date   ABDOMINAL HYSTERECTOMY     ANTERIOR CERVICAL DECOMP/DISCECTOMY FUSION  12/09/2010   Procedure: ANTERIOR CERVICAL DECOMPRESSION/DISCECTOMY FUSION 3 LEVELS;  Surgeon: JOphelia Charter  [facility];  Service: Neurosurgery;  Laterality: N/A;  Cervical three-four,Cervical four-five,Cervical Five-Six,Cervical Six-Seven ANTERIOR CERVICAL DECOMPRESSION WITH FUSION INTERBODY PROTHESIS PLATING AND  BONEGRAFT   APPENDECTOMY     BIOPSY N/A 11/14/2014   Procedure: GASTRIC BIOPSY;  Surgeon: RDaneil Dolin MD;  Location: [facility];  Service: Endoscopy;  Laterality: N/A;   CESAREAN SECTION     CHOLECYSTECTOMY     COLONOSCOPY WITH PROPOFOL N/A 11/14/2014   RMR: redundant colon but otherwise normal   COLONOSCOPY WITH PROPOFOL N/A 05/12/2020   Dr. RGala Romney  Seven 4 to 7 mm polyps, diverticulosis in the sigmoid colon. Pathology with tubular adenomas. Recommended repeat in 3 years.   ESOPHAGOGASTRODUODENOSCOPY (EGD) WITH PROPOFOL N/A 11/14/2014   RMR"s/p gastric surgery/anastomtic ulcer   ESOPHAGOGASTRODUODENOSCOPY (EGD) WITH PROPOFOL N/A 09/15/2017   Procedure: ESOPHAGOGASTRODUODENOSCOPY (EGD) WITH PROPOFOL;  Surgeon: RDaneil Dolin MD;  [facility];  Service: Endoscopy;  Laterality: N/A;  11:30am   ESOPHAGOGASTRODUODENOSCOPY (EGD) WITH PROPOFOL N/A 04/04/2019  2 cm ulcer crater at anastomosis with smaller satellite areas of ulceration in setting of NSAIDs/Goody powders, no bleeding stigmata.   ESOPHAGOGASTRODUODENOSCOPY (EGD) WITH PROPOFOL N/A 06/13/2019   [place]: Prior gastric bypass surgery with Billroth II type anatomy, otherwise normal-appearing residual upper GI tract.   EYE SURGERY     bilateral cataracts removed, w/IOL   GASTRIC BYPASS  2008   JOINT REPLACEMENT     07/2010 &  2002- respectively- both knees    LEFT ATRIAL APPENDAGE OCCLUSION N/A 12/03/2021   Procedure: LEFT ATRIAL APPENDAGE OCCLUSION;  Surgeon: [name], MD;  Location: [facility];  Service: Cardiovascular;  Laterality: N/A;   MALONEY DILATION N/A 09/15/2017   Procedure: Venia Minks DILATION;  Surgeon: Daneil Dolin, MD;  [facility]: [facility];  Service: Endoscopy;  Laterality: N/A;   PARATHYROIDECTOMY     PERIPHERALLY INSERTED CENTRAL CATHETER INSERTION     PICC line for treatment of MRSA   POLYPECTOMY  05/12/2020   Procedure: POLYPECTOMY;  Surgeon: Daneil Dolin, MD;  [facility];  Service: Endoscopy;;   TEE WITHOUT CARDIOVERSION N/A 12/03/2021   Procedure: TRANSESOPHAGEAL ECHOCARDIOGRAM (TEE);  Surgeon: [name], MD;  Location: [facility];  Service: Cardiovascular;  Laterality: N/A;   TONSILLECTOMY     as a child    Current Medications: No outpatient medications have been marked as taking for the 01/04/22 encounter (Appointment) with [facility].     Allergies:   Benzodiazepines, Codeine, Escitalopram oxalate, Nsaids, Sertraline hcl, Statins, and Tramadol hcl   Social History   Socioeconomic History   Marital status: Widowed    Spouse name: Not on file   Number of children: 4   Years of education: Not on file   Highest education level: Not on file  Occupational History   Not on file  Tobacco Use   Smoking status: Former    Packs/day: 1.00    Years: 30.00    Total pack years: 30.00    Types: Cigarettes    Quit date: 01/18/1998    Years since quitting: 23.9   Smokeless tobacco: Never  Vaping Use   Vaping Use: Never used  Substance and Sexual Activity   Alcohol use: No   Drug use: No   Sexual activity: Not on file    Comment: Hysterectomy  Other Topics Concern   Not on file  Social History Narrative   Not on file   Social Determinants of Health   Financial Resource Strain: Not on file  Food Insecurity: Not on file  Transportation Needs: Not on file  Physical Activity: Not on file  Stress: Not on file  Social Connections: Not on file     Family History: The patient's ***family history includes ADD / ADHD in her daughter; Anxiety disorder in her mother and sister; Bipolar disorder in her daughter; Breast cancer in her mother and sister; Heart attack in her father; OCD in her daughter. There is no history of Anesthesia problems, Hypotension, Malignant hyperthermia, Pseudochol deficiency, Dementia, Alcohol abuse, Drug abuse, Depression, Paranoid behavior, Schizophrenia, Seizures, Sexual abuse, Physical  abuse, or Colon cancer.  ROS:   Please see the history of present illness.    All other systems reviewed and are negative.  EKGs/Labs/Other Studies Reviewed:    The following studies were reviewed today:  Procedures This Admission:  Transeptal Puncture Intra-procedural TEE which showed no LAA thrombus Left atrial appendage occlusive device placement on 12/03/21 by Dr. Burt Knack.  Successful LAAO occlusion using a 31 mm Watchman FLX device   Recommend: Overnight observation Apixaban 2.5 mg BID x 6 weeks ASA 81 mg and Plavix 75 mg daily through 6 months Cardiac CTA for surveillance in 8 weeks  Cardiac CT: 07/2021 IMPRESSION: 1.  Severe LAE, moderate RAE   2.  No PFO/ASD   3.  Windsock LAA with no thrombus   4. Landing Zone 24.1 mm with depth 28.5 suitable for a 31 mm Watchman FLX   5.  Working angle LAO 2 Caudal 5 degrees   6. No pericardial effusion Heart shifted to left which may make TEE imaging challenging   7.  Normal ascending thoracic aorta 3.7 cm   8.  Calcium score 302 which is 80 th percentile for age/sex   TEE: 11/2021   1. TEE guided LAA closure. 31 mm Watchman FLX implanted with 19%  compression. No device leak. Small iatrogenic ASD with L to R shunting. No  immediate complications.   2. Left ventricular ejection fraction, by estimation, is 55 to 60%. The  left ventricle has normal function.   3. Right ventricular systolic function is normal. The right ventricular  size is normal.   4. Left atrial size was severely dilated. No left atrial/left atrial  appendage thrombus was detected.   5. Right atrial size was severely dilated.   6. The mitral valve is abnormal. Moderate to severe mitral valve  regurgitation.   7. The aortic valve is tricuspid. There is moderate calcification of the  aortic valve. There is moderate thickening of the aortic valve. Aortic  valve regurgitation is trivial. Aortic valve sclerosis/calcification is  present, without any  evidence of  aortic stenosis.   8. TEE guided left atrial appendage closure.   EKG:  EKG is *** ordered today.  The ekg ordered today demonstrates ***  Recent Labs: 03/19/2021: TSH 3.520 07/12/2021: ALT 13 11/13/2021: Hemoglobin 13.1; Platelets 193 12/04/2021: BUN 21; Creatinine, Ser 1.42; Potassium 4.8; Sodium 138  Recent Lipid Panel No results found for: "CHOL", "TRIG", "HDL", "CHOLHDL", "VLDL", "LDLCALC", "LDLDIRECT"   Risk Assessment/Calculations:   {Does this patient have ATRIAL FIBRILLATION?:[phone]}   CHA2DS2-VASc Score = 4 [CHF History: 0, HTN History: 1, Diabetes History: 0, Stroke History: 0, Vascular Disease History: 1, Age Score: 1, Gender Score: 1].  Therefore, the patient's annual risk of stroke is 4.8 %.        HAS-BLED score *** Hypertension (Uncontrolled in 30 days)  {YES/NO:21197} Abnormal renal and liver function (Dialysis, transplant, Cr >2.26 mg/dL /Cirrhosis or Bilirubin >2x Normal or AST/ALT/AP >3x Normal) {YES/NO:21197} Stroke {YES/NO:21197} Bleeding {YES/NO:21197} Labile INR (Unstable/high INR) {YES/NO:21197} Elderly (>65) {YES/NO:21197} Drugs or alcohol (? 8 drinks/week, anti-plt or NSAID) {YES/NO:21197}   Physical Exam:    VS:  There were no vitals taken for this visit.    Wt Readings from Last 3 Encounters:  12/08/21 169 lb 9.6 oz (76.9 kg)  12/03/21 169 lb 12.1 oz (77 kg)  11/13/21 168 lb (76.2 kg)     GEN: *** Well nourished, well developed in no acute distress HEENT: Normal NECK: No JVD; No carotid bruits LYMPHATICS: No lymphadenopathy CARDIAC: ***RRR, no murmurs, rubs, gallops RESPIRATORY:  Clear to auscultation without rales, wheezing or rhonchi  ABDOMEN: Soft, non-tender, non-distended MUSCULOSKELETAL:  No edema; No deformity  SKIN: Warm and dry NEUROLOGIC:  Alert and oriented x 3 PSYCHIATRIC:  Normal affect   ASSESSMENT:    No diagnosis found. PLAN:    In order of problems listed  above:       {Are you ordering a CV  Procedure (e.g. stress test, cath, DCCV, TEE, etc)?   Press F2        :[id number]}    Medication Adjustments/Labs and Tests Ordered: Current medicines are reviewed at length with the patient today.  Concerns regarding medicines are outlined above.  No orders of the defined types were placed in this encounter.  No orders of the defined types were placed in this encounter.   There are no Patient Instructions on file for this visit.   Signed, Kathyrn Drown, NP  12/31/2021 11:02 AM    [place]

## 2022-01-04 ENCOUNTER — Ambulatory Visit: Payer: PPO

## 2022-01-12 NOTE — Progress Notes (Unsigned)
[place]                                     Cardiology Office Note:    Date:  01/14/2022   ID:  BRIGGITTE [name], DOB [birth year], MRN [id number]  PCP:  Lemmie Evens, MD  [facility] Cardiologist:  Rozann Lesches, MD  [place] Electrophysiologist:  None   Referring MD: Lemmie Evens, MD   S/p [place] with Watchman (12/03/21)  History of Present Illness:    [name] is a 76 y.o. female with a hx of OCD, OSA, mild cognitive impairment, gastric bypass surgery, CKD stage II, anemia, GI bleeding and persistent atrial fibrillations/p/ LAAO with Watchman (12/03/21) who presents to clinic for follow up.   The patient has a history of gastric bypass in 2007.  She was noted to have an anastomotic ulcer in 2016.  She has had hospitalizations for abdominal pain in 2021 and was found to have an ulcer at that time.  Colonoscopy in 2022 demonstrated multiple polyps and diverticulosis with a repeat colonoscopy recommended at a 3-year interval.  She has had iron deficiency anemia and last received Feraheme in 2022.  The patient has struggled with chronic nausea and vomiting and abdominal pain.  Her iron deficiency anemia is felt to be related to malabsorption.  She has not had overt gastrointestinal bleeding.  She was seen by GI in April 2023 and felt to be a reasonable candidate for short-term anticoagulation, but there was a plan for further discussion with Dr. Gala Romney documented.  Per documentation he has reviewed her case and cleared her to use short-term anticoagulation but it is important for her to understand there is no way to prevent a future GI bleed and it will be important for her to avoid all nonsteroidal anti-inflammatory products.  She underwent successful LAAO occlusion using a 31 mm Watchman FLX device on 12/03/21 by Dr. Burt Knack. She was discharged the following day on apixaban 2.5 mg BID x 6 weeks followed by ASA 81 mg and  Plavix 75 mg daily through 6 months and cardiac CTA for surveillance in 8 weeks.  Today the patient presents to clinic for follow up. Here with daughter and granddaughter. No CP or SOB. No LE edema, orthopnea or PND. No dizziness or syncope. No blood in stool or urine. No palpitations.    Past Medical History:  Diagnosis Date   Anemia    Anxiety    Arthritis    Chronic back pain    Spondylosis and stenosis   Chronic kidney disease, stage 3a (HCC)    Depression    GERD (gastroesophageal reflux disease)    Headache(784.0)    Hiatal hernia    History of blood transfusion    History of GI bleed    Hypothyroidism    Mitral regurgitation    Nephrolithiasis    Obsessive-compulsive disorder    Persistent atrial fibrillation ([place])    Presence of Watchman left atrial appendage closure device 12/03/2021   61m Watchman FLX with Dr. CBurt Knack  Recurrent upper respiratory infection (URI)    Sleep apnea     Past Surgical History:  Procedure Laterality Date   ABDOMINAL HYSTERECTOMY     ANTERIOR CERVICAL DECOMP/DISCECTOMY FUSION  12/09/2010   Procedure: ANTERIOR CERVICAL DECOMPRESSION/DISCECTOMY FUSION 3 LEVELS;  Surgeon: JOphelia Charter  Location: [facility];  Service: Neurosurgery;  Laterality: N/A;  Cervical three-four,Cervical four-five,Cervical Five-Six,Cervical Six-Seven ANTERIOR CERVICAL DECOMPRESSION WITH FUSION INTERBODY PROTHESIS PLATING AND BONEGRAFT   APPENDECTOMY     BIOPSY N/A 11/14/2014   Procedure: GASTRIC BIOPSY;  Surgeon: Daneil Dolin, MD;  Location: [facility];  Service: Endoscopy;  Laterality: N/A;   CESAREAN SECTION     CHOLECYSTECTOMY     COLONOSCOPY WITH PROPOFOL N/A 11/14/2014   RMR: redundant colon but otherwise normal   COLONOSCOPY WITH PROPOFOL N/A 05/12/2020   Dr. Gala Romney;  Seven 4 to 7 mm polyps, diverticulosis in the sigmoid colon. Pathology with tubular adenomas. Recommended repeat in 3 years.   ESOPHAGOGASTRODUODENOSCOPY (EGD) WITH PROPOFOL N/A 11/14/2014    RMR"s/p gastric surgery/anastomtic ulcer   ESOPHAGOGASTRODUODENOSCOPY (EGD) WITH PROPOFOL N/A 09/15/2017   Procedure: ESOPHAGOGASTRODUODENOSCOPY (EGD) WITH PROPOFOL;  Surgeon: Daneil Dolin, MD;  Location: [facility];  Service: Endoscopy;  Laterality: N/A;  11:30am   ESOPHAGOGASTRODUODENOSCOPY (EGD) WITH PROPOFOL N/A 04/04/2019   2 cm ulcer crater at anastomosis with smaller satellite areas of ulceration in setting of NSAIDs/Goody powders, no bleeding stigmata.   ESOPHAGOGASTRODUODENOSCOPY (EGD) WITH PROPOFOL N/A 06/13/2019   [place]: Prior gastric bypass surgery with Billroth II type anatomy, otherwise normal-appearing residual upper GI tract.   EYE SURGERY     bilateral cataracts removed, w/IOL   GASTRIC BYPASS  2008   JOINT REPLACEMENT     07/2010 &  2002- respectively- both knees    LEFT ATRIAL APPENDAGE OCCLUSION N/A 12/03/2021   Procedure: LEFT ATRIAL APPENDAGE OCCLUSION;  Surgeon: [name], MD;  Location: [place] [facility];  Service: Cardiovascular;  Laterality: N/A;   MALONEY DILATION N/A 09/15/2017   Procedure: Venia Minks DILATION;  Surgeon: Daneil Dolin, MD;  Location: [facility];  Service: Endoscopy;  Laterality: N/A;   PARATHYROIDECTOMY     PERIPHERALLY INSERTED CENTRAL CATHETER INSERTION     PICC line for treatment of MRSA   POLYPECTOMY  05/12/2020   Procedure: POLYPECTOMY;  Surgeon: Daneil Dolin, MD;  Location: [facility];  Service: Endoscopy;;   TEE WITHOUT CARDIOVERSION N/A 12/03/2021   Procedure: TRANSESOPHAGEAL ECHOCARDIOGRAM (TEE);  Surgeon: [name], MD;  Location: [place] [facility];  Service: Cardiovascular;  Laterality: N/A;   TONSILLECTOMY     as a child    Current Medications: Current Meds  Medication Sig   aspirin EC 81 MG tablet Take 1 tablet (81 mg total) by mouth daily. Swallow whole.   atenolol (TENORMIN) 50 MG tablet TAKE 1 TABLET(50 MG) BY MOUTH DAILY   clopidogrel (PLAVIX) 75 MG tablet Take 1 tablet (75 mg total) by mouth  daily.   cyanocobalamin (VITAMIN B12) 1000 MCG/ML injection Inject 1,000 mcg into the muscle every 30 (thirty) days.   D3-50 1.25 MG (50000 UT) capsule Take 50,000 Units by mouth once a week.   diltiazem (CARDIZEM CD) 240 MG 24 hr capsule Take 240 mg by mouth daily.   estradiol (ESTRACE) 0.1 MG/GM vaginal cream Place 1 Applicatorful vaginally in the morning and at bedtime.   levothyroxine (SYNTHROID, LEVOTHROID) 75 MCG tablet Take 75 mcg by mouth daily before breakfast.   oxybutynin (DITROPAN-XL) 5 MG 24 hr tablet Take 5 mg by mouth daily.   pantoprazole (PROTONIX) 40 MG tablet Take 1 tablet (40 mg total) by mouth 2 (two) times daily before a meal.   venlafaxine (EFFEXOR) 75 MG tablet Take 75 mg by mouth 2 (two) times daily.    Vitamin D, Ergocalciferol, (DRISDOL) 50000 units CAPS  capsule Take 50,000 Units by mouth every Monday.   [DISCONTINUED] apixaban (ELIQUIS) 2.5 MG TABS tablet Take 1 tablet (2.5 mg total) by mouth 2 (two) times daily. Start taking (1) tablet twice a day on 11/10     Allergies:   Benzodiazepines, Codeine, Escitalopram oxalate, Nsaids, Sertraline hcl, Statins, and Tramadol hcl   Social History   Socioeconomic History   Marital status: Widowed    Spouse name: Not on file   Number of children: 4   Years of education: Not on file   Highest education level: Not on file  Occupational History   Not on file  Tobacco Use   Smoking status: Former    Packs/day: 1.00    Years: 30.00    Total pack years: 30.00    Types: Cigarettes    Quit date: 01/18/1998    Years since quitting: 24.0   Smokeless tobacco: Never  Vaping Use   Vaping Use: Never used  Substance and Sexual Activity   Alcohol use: No   Drug use: No   Sexual activity: Not on file    Comment: Hysterectomy  Other Topics Concern   Not on file  Social History Narrative   Not on file   Social Determinants of Health   Financial Resource Strain: Not on file  Food Insecurity: Not on file  Transportation  Needs: Not on file  Physical Activity: Not on file  Stress: Not on file  Social Connections: Not on file     Family History: The patient's family history includes ADD / ADHD in her daughter; Anxiety disorder in her mother and sister; Bipolar disorder in her daughter; Breast cancer in her mother and sister; Heart attack in her father; OCD in her daughter. There is no history of Anesthesia problems, Hypotension, Malignant hyperthermia, Pseudochol deficiency, Dementia, Alcohol abuse, Drug abuse, Depression, Paranoid behavior, Schizophrenia, Seizures, Sexual abuse, Physical abuse, or Colon cancer.  ROS:   Please see the history of present illness.    All other systems reviewed and are negative.  EKGs/Labs/Other Studies Reviewed:    The following studies were reviewed today:   CT cardiac 07/30/21 IMPRESSION: 1. Mild cardiomegaly. 2. Dilated main pulmonary trunk suggesting pulmonary arterial hypertension. 3. Scattered tiny 2-3 mm pulmonary nodules, stable in appearance from 2021. No follow-up recommended.  IMPRESSION: 1.  Severe LAE, moderate RAE   2.  No PFO/ASD   3.  [place] LAA with no thrombus   4. Landing Zone 24.1 mm with depth 28.5 suitable for a 31 mm Watchman FLX   5.  Working angle LAO 2 Caudal 5 degrees   6. No pericardial effusion Heart shifted to left which may make TEE imaging challenging   7.  Normal ascending thoracic aorta 3.7 cm   8.  Calcium score 302 which is 80 th percentile for age/sex  _______________________________  LAAO 12/03/21 Conclusion  Successful LAAO occlusion using a 31 mm Watchman FLX device   Recommend: Overnight observation Apixaban 2.5 mg BID x 6 weeks ASA 81 mg and Plavix 75 mg daily through 6 months Cardiac CTA for surveillance in 8 weeks    EKG:  EKG is ordered today.  The ekg ordered today demonstrates afib HR 79  Recent Labs: 03/19/2021: TSH 3.520 07/12/2021: ALT 13 01/13/2022: BUN 28; Creatinine, Ser 1.24; Hemoglobin  12.9; Platelets 185; Potassium 4.5; Sodium 142  Recent Lipid Panel No results found for: "CHOL", "TRIG", "HDL", "CHOLHDL", "VLDL", "LDLCALC", "LDLDIRECT"   Risk Assessment/Calculations:    CHA2DS2-VASc Score = 4  This indicates a 4.8% annual risk of stroke. The patient's score is based upon: CHF History: 0 HTN History: 1 Diabetes History: 0 Stroke History: 0 Vascular Disease History: 1 Age Score: 1 Gender Score: 1    HAS-BLED score = 2 Hypertension No  Abnormal renal and liver function (Dialysis, transplant, Cr >2.26 mg/dL /Cirrhosis or Bilirubin >2x Normal or AST/ALT/AP >3x Normal) No  Stroke No  Bleeding Yes  Labile INR (Unstable/high INR) No  Elderly (>65) Yes  Drugs or alcohol (? 8 drinks/week, anti-plt or NSAID) No    Physical Exam:    VS:  BP 131/88   Pulse 71   Ht '5\' 5"'$  (1.651 m)   Wt 174 lb (78.9 kg)   SpO2 96%   BMI 28.96 kg/m     Wt Readings from Last 3 Encounters:  01/13/22 174 lb (78.9 kg)  12/08/21 169 lb 9.6 oz (76.9 kg)  12/03/21 169 lb 12.1 oz (77 kg)     GEN:  Well nourished, well developed in no acute distress HEENT: Normal NECK: No JVD LYMPHATICS: No lymphadenopathy CARDIAC: irreg irreg, no murmurs, rubs, gallops RESPIRATORY:  Clear to auscultation without rales, wheezing or rhonchi  ABDOMEN: Soft, non-tender, non-distended MUSCULOSKELETAL:  No edema; No deformity  SKIN: Warm and dry NEUROLOGIC:  Alert and oriented x 3 PSYCHIATRIC:  Normal affect   ASSESSMENT:    1. Presence of Watchman left atrial appendage closure device   2. Persistent atrial fibrillation ([place])   3. History of GI bleed     PLAN:    In order of problems listed above:  Persistent atrial fibrillation with history of GI bleeding s/p Watchman: successful LAAO occlusion using a 31 mm Watchman FLX device on 12/03/21 by Dr. Burt Knack. Plan to stop apixaban 2.5 mg BID 01/14/22 and start ASA 81 mg and Plavix 75 mg daily through 6 months (06/03/22). Cardiac CTA for  surveillance set up for 02/10/21. Labs in anticipation of that today. Will transition off all antiplatelets at 6 months (discussed with Dr. Burt Knack).   Medication Adjustments/Labs and Tests Ordered: Current medicines are reviewed at length with the patient today.  Concerns regarding medicines are outlined above.  Orders Placed This Encounter  Procedures   Basic metabolic panel   CBC   Meds ordered this encounter  Medications   clopidogrel (PLAVIX) 75 MG tablet    Sig: Take 1 tablet (75 mg total) by mouth daily.    Dispense:  90 tablet    Refill:  3   aspirin EC 81 MG tablet    Sig: Take 1 tablet (81 mg total) by mouth daily. Swallow whole.    Dispense:  90 tablet    Refill:  3    Patient Instructions  Medication Instructions:  Your physician has recommended you make the following change in your medication:   Stop Eliquis after tonight's dose  Start Plavix 75 mg daily and Aspirin 81 mg daily tomorrow   *If you need a refill on your cardiac medications before your next appointment, please call your pharmacy*   Lab Work: CBC, BMET If you have labs (blood work) drawn today and your tests are completely normal, you will receive your results only by: [place] (if you have MyChart) OR A paper copy in the mail If you have any lab test that is abnormal or we need to change your treatment, we will call you to review the results.   Follow-Up: At [facility], you and your health needs are our  priority.  As part of our continuing mission to provide you with exceptional heart care, we have created designated Provider Care Teams.  These Care Teams include your primary Cardiologist (physician) and Advanced Practice Providers (APPs -  Physician Assistants and Nurse Practitioners) who all work together to provide you with the care you need, when you need it.  We recommend signing up for the patient portal called "MyChart".  Sign up information is provided on this After Visit  Summary.  MyChart is used to connect with patients for Virtual Visits (Telemedicine).  Patients are able to view lab/test results, encounter notes, upcoming appointments, etc.  Non-urgent messages can be sent to your provider as well.   To learn more about what you can do with MyChart, go to [email or web address].    Your next appointment:   As scheduled   Important Information About Sugar         Signed, Angelena Form, PA-C  01/14/2022 9:29 AM    [place]

## 2022-01-13 ENCOUNTER — Ambulatory Visit: Payer: PPO | Attending: Physician Assistant | Admitting: Physician Assistant

## 2022-01-13 VITALS — BP 131/88 | HR 71 | Ht 65.0 in | Wt 174.0 lb

## 2022-01-13 DIAGNOSIS — Z95818 Presence of other cardiac implants and grafts: Secondary | ICD-10-CM | POA: Diagnosis not present

## 2022-01-13 DIAGNOSIS — I4819 Other persistent atrial fibrillation: Secondary | ICD-10-CM | POA: Diagnosis not present

## 2022-01-13 DIAGNOSIS — Z8719 Personal history of other diseases of the digestive system: Secondary | ICD-10-CM | POA: Diagnosis not present

## 2022-01-13 MED ORDER — ASPIRIN 81 MG PO TBEC: 81.0000 mg | DELAYED_RELEASE_TABLET | Freq: Every day | ORAL | 3 refills | Status: DC

## 2022-01-13 MED ORDER — CLOPIDOGREL BISULFATE 75 MG PO TABS: 75.0000 mg | ORAL_TABLET | Freq: Every day | ORAL | 3 refills | Status: DC

## 2022-01-13 NOTE — Patient Instructions (Signed)
Medication Instructions:  Your physician has recommended you make the following change in your medication:   Stop Eliquis after tonight's dose  Start Plavix 75 mg daily and Aspirin 81 mg daily tomorrow   *If you need a refill on your cardiac medications before your next appointment, please call your pharmacy*   Lab Work: CBC, BMET If you have labs (blood work) drawn today and your tests are completely normal, you will receive your results only by: [place] (if you have MyChart) OR A paper copy in the mail If you have any lab test that is abnormal or we need to change your treatment, we will call you to review the results.   Follow-Up: At [facility], you and your health needs are our priority.  As part of our continuing mission to provide you with exceptional heart care, we have created designated Provider Care Teams.  These Care Teams include your primary Cardiologist (physician) and Advanced Practice Providers (APPs -  Physician Assistants and Nurse Practitioners) who all work together to provide you with the care you need, when you need it.  We recommend signing up for the patient portal called "MyChart".  Sign up information is provided on this After Visit Summary.  MyChart is used to connect with patients for Virtual Visits (Telemedicine).  Patients are able to view lab/test results, encounter notes, upcoming appointments, etc.  Non-urgent messages can be sent to your provider as well.   To learn more about what you can do with MyChart, go to [email or web address].    Your next appointment:   As scheduled   Important Information About Sugar

## 2022-01-14 ENCOUNTER — Other Ambulatory Visit: Payer: Self-pay | Admitting: Gastroenterology

## 2022-01-14 DIAGNOSIS — K219 Gastro-esophageal reflux disease without esophagitis: Secondary | ICD-10-CM

## 2022-01-14 LAB — BASIC METABOLIC PANEL
BUN/Creatinine Ratio: 23 (ref 12–28)
BUN: 28 mg/dL — ABNORMAL HIGH (ref 8–27)
CO2: 23 mmol/L (ref 20–29)
Calcium: 9.6 mg/dL (ref 8.7–10.3)
Chloride: 108 mmol/L — ABNORMAL HIGH (ref 96–106)
Creatinine, Ser: 1.24 mg/dL — ABNORMAL HIGH (ref 0.57–1.00)
Glucose: 66 mg/dL — ABNORMAL LOW (ref 70–99)
Potassium: 4.5 mmol/L (ref 3.5–5.2)
Sodium: 142 mmol/L (ref 134–144)
eGFR: 45 mL/min/{1.73_m2} — ABNORMAL LOW (ref >59)

## 2022-01-14 LAB — CBC
Hematocrit: 38.4 % (ref 34.0–46.6)
Hemoglobin: 12.9 g/dL (ref 11.1–15.9)
MCH: 32.6 pg (ref 26.6–33.0)
MCHC: 33.6 g/dL (ref 31.5–35.7)
MCV: 97 fL (ref 79–97)
Platelets: 185 10*3/uL (ref 150–450)
RBC: 3.96 x10E6/uL (ref 3.77–5.28)
RDW: 11.8 % (ref 11.7–15.4)
WBC: 7.3 10*3/uL (ref 3.4–10.8)

## 2022-01-28 ENCOUNTER — Other Ambulatory Visit: Payer: Self-pay | Admitting: Physician Assistant

## 2022-02-02 ENCOUNTER — Telehealth: Payer: Self-pay | Admitting: Cardiology

## 2022-02-02 NOTE — Telephone Encounter (Signed)
I left message with daughter to call back if there was anything else going on with her mother and to keep scheduled follow up.

## 2022-02-02 NOTE — Telephone Encounter (Signed)
Daughter said she feels her mother wants to get out of the house as she was completely dressed and ready to leave the house from boredom. Patient rarely gets out and her daughter has family obligations and is unable to take her.   Daughter states [name] says her legs are swollen but daughter said she cannot appreciate any swelling. Weight is 172.8 lbs at home,was 174 lbs at Dec 2023 apt  Daughter also states her mother has chronic c/o dizziness and it awaiting to here back from pcp for ENT referral.She was not sure what to do and wanted her [facility] cardiology office to be aware.

## 2022-02-02 NOTE — Telephone Encounter (Signed)
STAT if patient feels like he/she is going to faint   Are you dizzy now? No  Do you feel faint or have you passed out? No   Do you have any other symptoms? Pt complains of swelling in legs that the daughter does not notice when looking at legs  Have you checked your HR and BP (record if available)?  151/94 HR 111  Daughter checked pt's BP while on call as well as weight that came back as 172.8 lbs

## 2022-02-09 ENCOUNTER — Telehealth (HOSPITAL_COMMUNITY): Payer: Self-pay | Admitting: *Deleted

## 2022-02-09 NOTE — Telephone Encounter (Signed)
[name] returning call regarding upcoming cardiac imaging study; pt's daughter verbalizes understanding of appt date/time, parking situation and where to check in, and verified current allergies; name and call back number provided for further questions should they arise  Kendra Clement RN [place] [facility] [phone] office [phone] cell  [name] is aware patient to arrive at 1pm.

## 2022-02-09 NOTE — Telephone Encounter (Signed)
Attempted to call patient regarding upcoming cardiac CT appointment. °Left message on voicemail with name and callback number ° °Jovanne Riggenbach RN Navigator Cardiac Imaging °[facility] °[phone] Office °[phone] Cell ° °

## 2022-02-10 ENCOUNTER — Ambulatory Visit (HOSPITAL_COMMUNITY)
Admission: RE | Admit: 2022-02-10 | Discharge: 2022-02-10 | Disposition: A | Discharge status: Home / Self Care | Payer: PPO | Source: Ambulatory Visit | Attending: Cardiovascular Disease | Admitting: Cardiovascular Disease

## 2022-02-10 ENCOUNTER — Other Ambulatory Visit (HOSPITAL_COMMUNITY): Payer: PPO

## 2022-02-10 DIAGNOSIS — I4819 Other persistent atrial fibrillation: Secondary | ICD-10-CM | POA: Insufficient documentation

## 2022-02-10 DIAGNOSIS — Z8719 Personal history of other diseases of the digestive system: Secondary | ICD-10-CM | POA: Diagnosis not present

## 2022-02-10 DIAGNOSIS — Z95818 Presence of other cardiac implants and grafts: Secondary | ICD-10-CM | POA: Diagnosis not present

## 2022-02-10 MED ORDER — IOHEXOL 350 MG/ML SOLN
95.0000 mL | Freq: Once | INTRAVENOUS | Status: AC | PRN
  Administered 2022-02-10: 95 mL via INTRAVENOUS

## 2022-02-12 ENCOUNTER — Telehealth: Payer: Self-pay

## 2022-02-12 NOTE — Telephone Encounter (Signed)
-----  Message from [name], MD sent at 02/12/2022  6:20 AM EST ----- No DRT or peri-device leak. Continue current management plan (ASA and plavix through 06/03/22).

## 2022-02-12 NOTE — Telephone Encounter (Signed)
Reviewed results with patient's daughter who verbalized understanding.   Confirmed 6 month visit with patient in May 2024. She was grateful for call and agreed with plan.

## 2022-03-15 ENCOUNTER — Other Ambulatory Visit: Payer: Self-pay | Admitting: Physician Assistant

## 2022-04-09 ENCOUNTER — Other Ambulatory Visit: Payer: Self-pay | Admitting: Cardiology

## 2022-06-01 NOTE — Progress Notes (Deleted)
[facility]   MULTIDISCIPLINARY HEART VALVE CLINIC                                     Cardiology Office Note:    Date:  06/02/2022   ID:  ERCELLE CONTESSA, DOB [birth year], MRN [id number]  PCP:  Gareth Morgan, MD  [facility] Cardiologist:  Nona Dell, MD  [facility] Electrophysiologist:  None   Referring MD: Gareth Morgan, MD   6 months s/p LAAO with Watchman (12/03/21)  History of Present Illness:    [name] is a 77 y.o. female with a hx of OCD, OSA, mild cognitive impairment, gastric bypass surgery, CKD stage II, anemia, GI bleeding and persistent atrial fibrillations/p/ LAAO with Watchman (12/03/21) who presents to clinic for follow up.   The patient has a history of gastric bypass in 2007.  She was noted to have an anastomotic ulcer in 2016.  She has had hospitalizations for abdominal pain in 2021 and was found to have an ulcer at that time.  Colonoscopy in 2022 demonstrated multiple polyps and diverticulosis with a repeat colonoscopy recommended at a 3-year interval.  She has had iron deficiency anemia and last received Feraheme in 2022.  The patient has struggled with chronic nausea and vomiting and abdominal pain.  Her iron deficiency anemia is felt to be related to malabsorption.  She has not had overt gastrointestinal bleeding.  She was seen by GI in April 2023 and felt to be a reasonable candidate for short-term anticoagulation, but there was a plan for further discussion with Dr. Jena Gauss documented.  Per documentation he has reviewed her case and cleared her to use short-term anticoagulation but it is important for her to understand there is no way to prevent a future GI bleed and it will be important for her to avoid all nonsteroidal anti-inflammatory products.  She underwent successful LAAO occlusion using a 31 mm Watchman FLX device on 12/03/21 by Dr. Excell Seltzer. She was discharged the following day on apixaban 2.5 mg BID x 6 weeks followed by ASA 81 mg  and Plavix 75 mg daily through 6 months. Follow up cardiac CTA 02/11/22 showed normal position with no leak or thrombus. There was contrast past the device signaling possible delayed endothelialization. Dr. Excell Seltzer reviewed and felt like we could continue same medication plan.   Today the patient presents to clinic for follow up.   Past Medical History:  Diagnosis Date   Anemia    Anxiety    Arthritis    Chronic back pain    Spondylosis and stenosis   Chronic kidney disease, stage 3a (HCC)    Depression    GERD (gastroesophageal reflux disease)    Headache(784.0)    Hiatal hernia    History of blood transfusion    History of GI bleed    Hypothyroidism    Mitral regurgitation    Nephrolithiasis    Obsessive-compulsive disorder    Persistent atrial fibrillation (HCC)    Presence of Watchman left atrial appendage closure device 12/03/2021   31mm Watchman FLX with Dr. Excell Seltzer   Recurrent upper respiratory infection (URI)    Sleep apnea     Past Surgical History:  Procedure Laterality Date   ABDOMINAL HYSTERECTOMY     ANTERIOR CERVICAL DECOMP/DISCECTOMY FUSION  12/09/2010   Procedure: ANTERIOR CERVICAL DECOMPRESSION/DISCECTOMY FUSION 3 LEVELS;  Surgeon: Cristi Loron;  Location: [facility];  Service: Neurosurgery;  Laterality: N/A;  Cervical three-four,Cervical four-five,Cervical Five-Six,Cervical Six-Seven ANTERIOR CERVICAL DECOMPRESSION WITH FUSION INTERBODY PROTHESIS PLATING AND BONEGRAFT   APPENDECTOMY     BIOPSY N/A 11/14/2014   Procedure: GASTRIC BIOPSY;  Surgeon: Corbin Ade, MD;  Location: [facility] ORS;  Service: Endoscopy;  Laterality: N/A;   CESAREAN SECTION     CHOLECYSTECTOMY     COLONOSCOPY WITH PROPOFOL N/A 11/14/2014   RMR: redundant colon but otherwise normal   COLONOSCOPY WITH PROPOFOL N/A 05/12/2020   Dr. Jena Gauss;  Seven 4 to 7 mm polyps, diverticulosis in the sigmoid colon. Pathology with tubular adenomas. Recommended repeat in 3 years.    ESOPHAGOGASTRODUODENOSCOPY (EGD) WITH PROPOFOL N/A 11/14/2014   RMR"s/p gastric surgery/anastomtic ulcer   ESOPHAGOGASTRODUODENOSCOPY (EGD) WITH PROPOFOL N/A 09/15/2017   Procedure: ESOPHAGOGASTRODUODENOSCOPY (EGD) WITH PROPOFOL;  Surgeon: Corbin Ade, MD;  Location: [facility];  Service: Endoscopy;  Laterality: N/A;  11:30am   ESOPHAGOGASTRODUODENOSCOPY (EGD) WITH PROPOFOL N/A 04/04/2019   2 cm ulcer crater at anastomosis with smaller satellite areas of ulceration in setting of NSAIDs/Goody powders, no bleeding stigmata.   ESOPHAGOGASTRODUODENOSCOPY (EGD) WITH PROPOFOL N/A 06/13/2019   Rourk: Prior gastric bypass surgery with Billroth II type anatomy, otherwise normal-appearing residual upper GI tract.   EYE SURGERY     bilateral cataracts removed, w/IOL   GASTRIC BYPASS  2008   JOINT REPLACEMENT     07/2010 &  2002- respectively- both knees    LEFT ATRIAL APPENDAGE OCCLUSION N/A 12/03/2021   Procedure: LEFT ATRIAL APPENDAGE OCCLUSION;  Surgeon: Tonny Bollman, MD;  Location: [facility] INVASIVE CV LAB;  Service: Cardiovascular;  Laterality: N/A;   MALONEY DILATION N/A 09/15/2017   Procedure: Elease Hashimoto DILATION;  Surgeon: Corbin Ade, MD;  Location: [facility];  Service: Endoscopy;  Laterality: N/A;   PARATHYROIDECTOMY     PERIPHERALLY INSERTED CENTRAL CATHETER INSERTION     PICC line for treatment of MRSA   POLYPECTOMY  05/12/2020   Procedure: POLYPECTOMY;  Surgeon: Corbin Ade, MD;  Location: [facility];  Service: Endoscopy;;   TEE WITHOUT CARDIOVERSION N/A 12/03/2021   Procedure: TRANSESOPHAGEAL ECHOCARDIOGRAM (TEE);  Surgeon: Tonny Bollman, MD;  Location: [facility] INVASIVE CV LAB;  Service: Cardiovascular;  Laterality: N/A;   TONSILLECTOMY     as a child    Current Medications: No outpatient medications have been marked as taking for the 06/02/22 encounter (Appointment) with [facility].     Allergies:   Benzodiazepines, Codeine, Escitalopram oxalate,  Nsaids, Sertraline hcl, Statins, and Tramadol hcl   Social History   Socioeconomic History   Marital status: Widowed    Spouse name: Not on file   Number of children: 4   Years of education: Not on file   Highest education level: Not on file  Occupational History   Not on file  Tobacco Use   Smoking status: Former    Packs/day: 1.00    Years: 30.00    Additional pack years: 0.00    Total pack years: 30.00    Types: Cigarettes    Quit date: 01/18/1998    Years since quitting: 24.3   Smokeless tobacco: Never  Vaping Use   Vaping Use: Never used  Substance and Sexual Activity   Alcohol use: No   Drug use: No   Sexual activity: Not on file    Comment: Hysterectomy  Other Topics Concern   Not on file  Social History Narrative   Not on file  Social Determinants of Health   Financial Resource Strain: Not on file  Food Insecurity: Not on file  Transportation Needs: Not on file  Physical Activity: Not on file  Stress: Not on file  Social Connections: Not on file     Family History: The patient's family history includes ADD / ADHD in her daughter; Anxiety disorder in her mother and sister; Bipolar disorder in her daughter; Breast cancer in her mother and sister; Heart attack in her father; OCD in her daughter. There is no history of Anesthesia problems, Hypotension, Malignant hyperthermia, Pseudochol deficiency, Dementia, Alcohol abuse, Drug abuse, Depression, Paranoid behavior, Schizophrenia, Seizures, Sexual abuse, Physical abuse, or Colon cancer.  ROS:   Please see the history of present illness.    All other systems reviewed and are negative.  EKGs/Labs/Other Studies Reviewed:    The following studies were reviewed today:   CT cardiac 07/30/21 IMPRESSION: 1. Mild cardiomegaly. 2. Dilated main pulmonary trunk suggesting pulmonary arterial hypertension. 3. Scattered tiny 2-3 mm pulmonary nodules, stable in appearance from 2021. No follow-up  recommended.  IMPRESSION: 1.  Severe LAE, moderate RAE   2.  No PFO/ASD   3.  [place] LAA with no thrombus   4. Landing Zone 24.1 mm with depth 28.5 suitable for a 31 mm Watchman FLX   5.  Working angle LAO 2 Caudal 5 degrees   6. No pericardial effusion Heart shifted to left which may make TEE imaging challenging   7.  Normal ascending thoracic aorta 3.7 cm   8.  Calcium score 302 which is 80 th percentile for age/sex  _______________________________  LAAO 12/03/21 Conclusion  Successful LAAO occlusion using a 31 mm Watchman FLX device   Recommend: Overnight observation Apixaban 2.5 mg BID x 6 weeks ASA 81 mg and Plavix 75 mg daily through 6 months Cardiac CTA for surveillance in 8 weeks  ___________________________________  Cardiac CT 02/10/22 Cardic CT/CTA   TECHNIQUE: A non-contrast, gated CT scan was obtained with axial slices of 3 mm through the heart for calcium scoring. Calcium scoring was performed using the Agatston method. A 130 kV prospective, gated, contrast cardiac scan was obtained. Gantry rotation speed was 250 msecs and collimation was 0.6 mm. Nitroglycerin was not given. A delayed scan was obtained to exclude left atrial appendage thrombus. The 3D dataset was reconstructed in 5% intervals of the 25-50% of the R-R cycle. Late systolic phases were analyzed on a dedicated workstation using MPR, MIP, and VRT modes. The patient received 80 cc of contrast.   FINDINGS: Image quality: excellent.   Noise artifact is: Limited.   Left Atrium: The left atrial size is dilated. There is no PFO/ASD. There is normal pulmonary vein drainage into the left atrium (2 on the right and 2 on the left).   Left Atrial Appendage: A 31 mm Watchman FLX is present with no evidence of device leak. There is contrast past the device, suggestive of delayed endothelialization. No device related thrombus.   Coronary Arteries: Normal coronary origin. Right dominance.  The study was performed without use of NTG and is insufficient for plaque evaluation. Mild coronary calcifications noted.   Right Atrium: Right atrial size is dilated.   Right Ventricle: The right ventricular cavity is within normal limits.   Left Ventricle: The ventricular cavity size is within normal limits.   Pulmonary Artery: Dilated pulmonary artery suggestive of pulmonary hypertension.   Cardiac valves: The aortic valve is trileaflet without significant calcification. The mitral valve is normal structure without  significant calcification.   Aorta: Normal caliber with no significant disease.   Pericardium: Normal thickness with no significant effusion or calcium present. Prominent pericardial fat.   Extra-cardiac findings: See attached radiology report for non-cardiac structures.   IMPRESSION: 1. A 31 mm Watchman FLX is present with no evidence of device leak. There is contrast past the device, suggestive of delayed endothelialization.   2. Biatrial enlargement.   3. Dilated pulmonary artery suggestive of pulmonary hypertension.    EKG:  EKG is ordered today.  The ekg ordered today demonstrates afib HR 79  Recent Labs: 07/12/2021: ALT 13 01/13/2022: BUN 28; Creatinine, Ser 1.24; Hemoglobin 12.9; Platelets 185; Potassium 4.5; Sodium 142  Recent Lipid Panel No results found for: "CHOL", "TRIG", "HDL", "CHOLHDL", "VLDL", "LDLCALC", "LDLDIRECT"   Risk Assessment/Calculations:    CHA2DS2-VASc Score = 4   This indicates a 4.8% annual risk of stroke. The patient's score is based upon: CHF History: 0 HTN History: 1 Diabetes History: 0 Stroke History: 0 Vascular Disease History: 1 Age Score: 1 Gender Score: 1    HAS-BLED score = 2 Hypertension No  Abnormal renal and liver function (Dialysis, transplant, Cr >2.26 mg/dL /Cirrhosis or Bilirubin >2x Normal or AST/ALT/AP >3x Normal) No  Stroke No  Bleeding Yes  Labile INR (Unstable/high INR) No  Elderly (>65)  Yes  Drugs or alcohol (? 8 drinks/week, anti-plt or NSAID) No    Physical Exam:    VS:  There were no vitals taken for this visit.    Wt Readings from Last 3 Encounters:  01/13/22 174 lb (78.9 kg)  12/08/21 169 lb 9.6 oz (76.9 kg)  12/03/21 169 lb 12.1 oz (77 kg)     GEN:  Well nourished, well developed in no acute distress HEENT: Normal NECK: No JVD LYMPHATICS: No lymphadenopathy CARDIAC: irreg irreg, no murmurs, rubs, gallops RESPIRATORY:  Clear to auscultation without rales, wheezing or rhonchi  ABDOMEN: Soft, non-tender, non-distended MUSCULOSKELETAL:  No edema; No deformity  SKIN: Warm and dry NEUROLOGIC:  Alert and oriented x 3 PSYCHIATRIC:  Normal affect   ASSESSMENT:    1. Presence of Watchman left atrial appendage closure device   2. Persistent atrial fibrillation (HCC)   3. History of GI bleed      PLAN:    In order of problems listed above:  Persistent atrial fibrillation with history of GI bleeding s/p Watchman: successful LAAO occlusion using a 31 mm Watchman FLX device on 12/03/21 by Dr. Excell Seltzer. Follow up cardiac CTA 02/11/22 showed normal position with no leak or thrombus. There was contrast past the device signaling possible delayed endothelialization. Dr. Excell Seltzer reviewed and felt like we could continue same medication plan.   Medication Adjustments/Labs and Tests Ordered: Current medicines are reviewed at length with the patient today.  Concerns regarding medicines are outlined above.  No orders of the defined types were placed in this encounter.  No orders of the defined types were placed in this encounter.   There are no Patient Instructions on file for this visit.   Signed, Cline Crock, PA-C  06/02/2022 11:44 AM    [facility]

## 2022-06-02 ENCOUNTER — Ambulatory Visit: Payer: PPO | Attending: Family Medicine

## 2022-06-03 ENCOUNTER — Encounter: Payer: Self-pay | Admitting: Physician Assistant

## 2022-06-03 ENCOUNTER — Ambulatory Visit: Payer: PPO | Attending: Student | Admitting: Student

## 2022-06-03 ENCOUNTER — Other Ambulatory Visit: Payer: Self-pay | Admitting: Gastroenterology

## 2022-06-03 ENCOUNTER — Encounter: Payer: Self-pay | Admitting: *Deleted

## 2022-06-03 ENCOUNTER — Encounter: Payer: Self-pay | Admitting: Student

## 2022-06-03 VITALS — BP 134/80 | HR 68 | Ht 66.0 in | Wt 176.6 lb

## 2022-06-03 DIAGNOSIS — Z95818 Presence of other cardiac implants and grafts: Secondary | ICD-10-CM

## 2022-06-03 DIAGNOSIS — K219 Gastro-esophageal reflux disease without esophagitis: Secondary | ICD-10-CM

## 2022-06-03 DIAGNOSIS — I340 Nonrheumatic mitral (valve) insufficiency: Secondary | ICD-10-CM | POA: Diagnosis not present

## 2022-06-03 DIAGNOSIS — I10 Essential (primary) hypertension: Secondary | ICD-10-CM

## 2022-06-03 DIAGNOSIS — I4819 Other persistent atrial fibrillation: Secondary | ICD-10-CM

## 2022-06-03 DIAGNOSIS — I2510 Atherosclerotic heart disease of native coronary artery without angina pectoris: Secondary | ICD-10-CM

## 2022-06-03 NOTE — Progress Notes (Signed)
Cardiology Office Note    Date:  06/03/2022  ID:  [name], DOB 02-27-1945, MRN [id number] Cardiologist: Nona Dell, MD    History of Present Illness:    [name] is a 77 y.o. female with past medical history of persistent atrial fibrillation (s/p Watchman device placement in 11/2021), chronic back pain, hypothyroidism, coronary calcification by CT and anemia (in the setting of iron deficiency anemia, PUD and prior GI bleed) who presents to the office today for 57-month follow-up.  She was last examined by Carlean Jews, PA-C in 12/2021 and denied any recent chest pain or palpitations at that time. It was recommended to discontinue Eliquis the following day and start ASA and Plavix for a duration of 6 months (through 06/03/2022) and then she could transition off all antiplatelets. She did have a follow-up Coronary CT in 01/2022 which showed no evidence of device leak and she did have some evidence of contrast past the device suggestive of delayed endothelialization.  In talking with the patient and her daughter today, she reports things have overall been stable from a cardiac perspective. She reports occasional palpitations but no persistent symptoms.  They do check her vitals occasionally at home but are unable to recall specific values. She denies any exertional chest pain or dyspnea on exertion. No recent orthopnea or PND. She does have chronic edema in the setting of varicosities. Not currently on diuretic therapy. She denies any recent melena, hematochezia or hematuria. Reports having intermittent dizziness which has been occurring for 5+ years and overall unchanged. Can occur at rest or with activity and is not associated with positional changes.  Studies Reviewed:   EKG: EKG is not ordered today.   TEE: 11/2021 IMPRESSIONS     1. TEE guided LAA closure. 31 mm Watchman FLX implanted with 19%  compression. No device leak. Small iatrogenic ASD with L to R shunting. No   immediate complications.   2. Left ventricular ejection fraction, by estimation, is 55 to 60%. The  left ventricle has normal function.   3. Right ventricular systolic function is normal. The right ventricular  size is normal.   4. Left atrial size was severely dilated. No left atrial/left atrial  appendage thrombus was detected.   5. Right atrial size was severely dilated.   6. The mitral valve is abnormal. Moderate to severe mitral valve  regurgitation.   7. The aortic valve is tricuspid. There is moderate calcification of the  aortic valve. There is moderate thickening of the aortic valve. Aortic  valve regurgitation is trivial. Aortic valve sclerosis/calcification is  present, without any evidence of  aortic stenosis.   8. TEE guided left atrial appendage closure.    Cardiac CT: 01/2022 IMPRESSION: 1. A 31 mm Watchman FLX is present with no evidence of device leak. There is contrast past the device, suggestive of delayed endothelialization.   2. Biatrial enlargement.   3. Dilated pulmonary artery suggestive of pulmonary hypertension.   Physical Exam:   VS:  BP 134/80   Pulse 68   Ht 5\' 6"  (1.676 m)   Wt 176 lb 9.6 oz (80.1 kg)   SpO2 99%   BMI 28.50 kg/m    Wt Readings from Last 3 Encounters:  06/03/22 176 lb 9.6 oz (80.1 kg)  01/13/22 174 lb (78.9 kg)  12/08/21 169 lb 9.6 oz (76.9 kg)     GEN: Pleasant, elderly female appearing in no acute distress NECK: No JVD; No carotid bruits CARDIAC: Irregularly irregular,  2/6 systolic murmur along Apex.  RESPIRATORY:  Clear to auscultation without rales, wheezing or rhonchi  ABDOMEN: Appears non-distended. No obvious abdominal masses. EXTREMITIES: No clubbing or cyanosis. No pitting edema.  Distal pedal pulses are 2+ bilaterally. Varicose veins noted.    Assessment and Plan:   1. Persistent Atrial Fibrillation - She reports occasional palpitations and I did encourage her to check her vitals when this occurs. At this  time, will continue current rate-controlling medications with Atenolol 50 mg daily and Cardizem CD 240 mg daily. - No longer on anticoagulation given placement of Watchman device.  2. S/p Watchman Device Placement in the setting of Anemia and prior GIB - Cardiac CT in 01/2022 showed no evidence of device leak but was noted to have contrast past the device which was suggestive of delayed endothelialization. I did confirm with the Structural Heart team today that she can still stop ASA and Plavix as previously indicated.  3. HTN - Her blood pressure is well-controlled at 134/80 during today's visit. Continue current medical therapy with Atenolol 50 mg daily and Cardizem CD 240 mg daily.  4. Mitral Regurgitation - This was moderate to severe by TEE in 11/2021. Would plan to arrange for a follow-up TTE at the time of her next office visit for reassessment.  5. Coronary Calcification by CT - This was noted on prior CT imaging. She is not currently on statin therapy. Will request a copy of most recent FLP and Hgb A1c from her PCP.    Signed, Ellsworth Lennox, PA-C

## 2022-06-03 NOTE — Patient Instructions (Signed)
Medication Instructions:  Your physician has recommended you make the following change in your medication:   Stop Taking Clopidogrel ( Plavix)   *If you need a refill on your cardiac medications before your next appointment, please call your pharmacy*   Lab Work: NONE   If you have labs (blood work) drawn today and your tests are completely normal, you will receive your results only by: MyChart Message (if you have MyChart) OR A paper copy in the mail If you have any lab test that is abnormal or we need to change your treatment, we will call you to review the results.   Testing/Procedures: NONE    Follow-Up: At [facility], you and your health needs are our priority.  As part of our continuing mission to provide you with exceptional heart care, we have created designated Provider Care Teams.  These Care Teams include your primary Cardiologist (physician) and Advanced Practice Providers (APPs -  Physician Assistants and Nurse Practitioners) who all work together to provide you with the care you need, when you need it.  We recommend signing up for the patient portal called "MyChart".  Sign up information is provided on this After Visit Summary.  MyChart is used to connect with patients for Virtual Visits (Telemedicine).  Patients are able to view lab/test results, encounter notes, upcoming appointments, etc.  Non-urgent messages can be sent to your provider as well.   To learn more about what you can do with MyChart, go to [email or web address].    Your next appointment:   6 month(s)  Provider:   Nona Dell, MD    Other Instructions Thank you for choosing [facility]!

## 2022-06-19 ENCOUNTER — Emergency Department (HOSPITAL_COMMUNITY)
Admission: EM | Admit: 2022-06-19 | Discharge: 2022-06-19 | Disposition: A | Discharge status: Home / Self Care | Payer: PPO | Attending: Emergency Medicine | Admitting: Emergency Medicine

## 2022-06-19 ENCOUNTER — Other Ambulatory Visit: Payer: Self-pay

## 2022-06-19 ENCOUNTER — Encounter (HOSPITAL_COMMUNITY): Payer: Self-pay | Admitting: Emergency Medicine

## 2022-06-19 DIAGNOSIS — K047 Periapical abscess without sinus: Secondary | ICD-10-CM | POA: Diagnosis not present

## 2022-06-19 DIAGNOSIS — E039 Hypothyroidism, unspecified: Secondary | ICD-10-CM | POA: Diagnosis not present

## 2022-06-19 DIAGNOSIS — K0889 Other specified disorders of teeth and supporting structures: Secondary | ICD-10-CM | POA: Diagnosis present

## 2022-06-19 DIAGNOSIS — N189 Chronic kidney disease, unspecified: Secondary | ICD-10-CM | POA: Insufficient documentation

## 2022-06-19 MED ORDER — AMOXICILLIN 500 MG PO CAPS: 500.0000 mg | ORAL_CAPSULE | Freq: Three times a day (TID) | ORAL | 0 refills | Status: DC

## 2022-06-19 MED ORDER — AMOXICILLIN 250 MG PO CAPS
500.0000 mg | ORAL_CAPSULE | Freq: Once | ORAL | Status: AC
  Administered 2022-06-19: 500 mg via ORAL
  Filled 2022-06-19: qty 2

## 2022-06-19 NOTE — Discharge Instructions (Signed)
You have been seen today for your complaint of dental infection. Your discharge medications include amoxicillin. Follow up with: Your dentist on Monday as scheduled Please seek immediate medical care if you develop any of the following symptoms: Your symptoms suddenly get worse. You have a very bad headache. You have problems breathing or swallowing. You have trouble opening your mouth. You have swelling in your neck or around your eye. At this time there does not appear to be the presence of an emergent medical condition, however there is always the potential for conditions to change. Please read and follow the below instructions.  Do not take your medicine if  develop an itchy rash, swelling in your mouth or lips, or difficulty breathing; call 911 and seek immediate emergency medical attention if this occurs.  You may review your lab tests and imaging results in their entirety on your MyChart account.  Please discuss all results of fully with your primary care provider and other specialist at your follow-up visit.  Note: Portions of this text may have been transcribed using voice recognition software. Every effort was made to ensure accuracy; however, inadvertent computerized transcription errors may still be present.

## 2022-06-19 NOTE — ED Provider Notes (Signed)
[place] [facility]ERGENCY DEPARTMENT AT [facility] Provider Note   CSN: [id number] Arrival date & time: 06/19/22  1827     History  Chief Complaint  Patient presents with   Dental Pain    [name] is a 77 y.o. female.  With an extensive history including but not limited to anxiety, depression, anemia, hypothyroidism, CKD, A-fib who presents to the ED for evaluation of dental pain.  She has had pain to the right lower molar for the past 5 days.  States the swelling got worse today.  She has had intermittent subjective fevers.  She reports pain with mastication but denies difficulty swallowing or breathing.  She has an appointment with her dentist on Monday.  Denies any nausea or vomiting.  She was able to eat just prior to arrival.  Initially presented to urgent care and was encouraged to initiate on antibiotics, however the urgent care did not have the patient's allergy list so patient was brought here by her daughter for reevaluation.  Daughter states that many medications make the patient's stomach hurt.   Dental Pain      Home Medications Prior to Admission medications   Medication Sig Start Date End Date Taking? Authorizing Provider  amoxicillin (AMOXIL) 500 MG capsule Take 1 capsule (500 mg total) by mouth 3 (three) times daily. 06/19/22  Yes Tamorah Hada, Edsel Petrin, PA-C  atenolol (TENORMIN) 50 MG tablet TAKE 1 TABLET(50 MG) BY MOUTH DAILY 04/09/22   Jonelle Sidle, MD  cyanocobalamin (VITAMIN B12) 1000 MCG/ML injection Inject 1,000 mcg into the muscle every 30 (thirty) days.    [provider]  D3-50 1.25 MG (50000 UT) capsule Take 50,000 Units by mouth once a week. 11/26/21   [provider]  diltiazem (CARDIZEM CD) 240 MG 24 hr capsule TAKE 1 CAPSULE(240 MG) BY MOUTH DAILY 03/16/22   Jonelle Sidle, MD  levothyroxine (SYNTHROID, LEVOTHROID) 75 MCG tablet Take 75 mcg by mouth daily before breakfast.    [provider]  oxybutynin  (DITROPAN-XL) 5 MG 24 hr tablet Take 5 mg by mouth daily. 02/21/21   [provider]  pantoprazole (PROTONIX) 40 MG tablet TAKE 1 TABLET(40 MG) BY MOUTH TWICE DAILY BEFORE A MEAL 06/03/22   Letta Median, PA-C  venlafaxine (EFFEXOR) 75 MG tablet Take 75 mg by mouth 2 (two) times daily.     [provider]  Vitamin D, Ergocalciferol, (DRISDOL) 50000 units CAPS capsule Take 50,000 Units by mouth every Monday.    [provider]      Allergies    Benzodiazepines, Codeine, Escitalopram oxalate, Nsaids, Sertraline hcl, Statins, and Tramadol hcl    Review of Systems   Review of Systems  HENT:  Positive for dental problem.   All other systems reviewed and are negative.   Physical Exam Updated Vital Signs BP (!) 143/93 (BP Location: Right Arm)   Pulse 86   Temp 99.2 F (37.3 C) (Temporal)   Resp 16   Ht 5\' 6"  (1.676 m)   Wt 81.6 kg   SpO2 100%   BMI 29.05 kg/m  Physical Exam Vitals and nursing note reviewed.  Constitutional:      General: She is not in acute distress.    Appearance: Normal appearance. She is normal weight. She is not ill-appearing.     Comments: Resting comfortably in bed  HENT:     Head: Normocephalic and atraumatic.     Mouth/Throat:     Mouth: Mucous membranes are  moist.     Pharynx: Oropharynx is clear.     Comments: Small amount of swelling to the right submandibular space.  No fluctuance, induration or erythema.  Significant dental erosions throughout.  No abscess or purulent drainage expressed.  Significant TTP to the right posterior molar area.  No erythema of the face or gingiva.  Mild trismus.  No drooling or tripoding.  Tongue rests comfortably on the floor of the mouth.  Soft palate rises with phonation.  Airway widely patent. Pulmonary:     Effort: Pulmonary effort is normal. No respiratory distress.  Abdominal:     General: Abdomen is flat.  Musculoskeletal:        General: Normal range of motion.     Cervical back: Neck  supple.  Skin:    General: Skin is warm and dry.  Neurological:     Mental Status: She is alert and oriented to person, place, and time.  Psychiatric:        Mood and Affect: Mood normal.        Behavior: Behavior normal.     ED Results / Procedures / Treatments   Labs (all labs ordered are listed, but only abnormal results are displayed) Labs Reviewed - No data to display  EKG None  Radiology No results found.  Procedures Procedures    Medications Ordered in ED Medications  amoxicillin (AMOXIL) capsule 500 mg (500 mg Oral Given 06/19/22 2118)    ED Course/ Medical Decision Making/ A&P                             Medical Decision Making This patient presents to the ED for concern of dental infection, this involves an extensive number of treatment options, and is a complaint that carries with it a high risk of complications and morbidity.  The differential diagnosis includes subperiosteal or periapical abscess, soft tissue abscess or cellulitis, Ludwig's angina, dental fracture  My initial workup includes antibiotics  Additional history obtained from: Nursing notes from this visit. Family daughter is at bedside and provides a portion of the history  Afebrile, hemodynamically stable.  77 year old female presenting to the ED for evaluation of right lower dental pain.  She has had this pain for approximately 5 days.  Got worse today.  Has a follow-up appointment with her dentist on Monday.  Initially presented to urgent care and was encouraged to start antibiotics, however presents to the ED because we have her allergy list.  It appears patient is not allergic to penicillins.  Will be initiated on amoxicillin.  First dose given in the ED.  On exam, she has some very mild trismus and right lower mandibular swelling.  Her airway is patent.  She is not in any distress.  She was able to tolerate p.o. intake.  She has dental erosions to most of his teeth.  She was strongly encouraged  to follow-up with her dentist on Monday as scheduled.  She was given return precautions.  Stable discharge.  At this time there does not appear to be any evidence of an acute emergency medical condition and the patient appears stable for discharge with appropriate outpatient follow up. Diagnosis was discussed with patient who verbalizes understanding of care plan and is agreeable to discharge. I have discussed return precautions with patient and daughter who verbalizes understanding. Patient encouraged to follow-up with their dentist as scheduled. All questions answered.  Patient's case discussed with Dr. Deretha Emory who  agrees with plan to discharge with follow-up.   Note: Portions of this report may have been transcribed using voice recognition software. Every effort was made to ensure accuracy; however, inadvertent computerized transcription errors may still be present.        Final Clinical Impression(s) / ED Diagnoses Final diagnoses:  Dental infection    Rx / DC Orders ED Discharge Orders          Ordered    amoxicillin (AMOXIL) 500 MG capsule  3 times daily        06/19/22 2103              Mora Bellman 06/19/22 2122    Vanetta Mulders, MD 06/19/22 2340

## 2022-06-19 NOTE — ED Triage Notes (Signed)
Pt c/o right lower dental abscess since Wednesday. Unable to get in to dentist until Monday. Face is swollen and pt reports pain.

## 2022-09-09 ENCOUNTER — Ambulatory Visit (INDEPENDENT_AMBULATORY_CARE_PROVIDER_SITE_OTHER): Payer: PPO | Admitting: Family Medicine

## 2022-09-09 ENCOUNTER — Encounter: Payer: Self-pay | Admitting: Family Medicine

## 2022-09-09 VITALS — BP 160/90 | HR 73 | Ht 66.0 in | Wt 179.0 lb

## 2022-09-09 DIAGNOSIS — R399 Unspecified symptoms and signs involving the genitourinary system: Secondary | ICD-10-CM

## 2022-09-09 DIAGNOSIS — Z1329 Encounter for screening for other suspected endocrine disorder: Secondary | ICD-10-CM

## 2022-09-09 DIAGNOSIS — K219 Gastro-esophageal reflux disease without esophagitis: Secondary | ICD-10-CM | POA: Diagnosis not present

## 2022-09-09 DIAGNOSIS — I10 Essential (primary) hypertension: Secondary | ICD-10-CM

## 2022-09-09 DIAGNOSIS — R4189 Other symptoms and signs involving cognitive functions and awareness: Secondary | ICD-10-CM

## 2022-09-09 DIAGNOSIS — Z131 Encounter for screening for diabetes mellitus: Secondary | ICD-10-CM

## 2022-09-09 DIAGNOSIS — Z13820 Encounter for screening for osteoporosis: Secondary | ICD-10-CM

## 2022-09-09 DIAGNOSIS — E559 Vitamin D deficiency, unspecified: Secondary | ICD-10-CM | POA: Diagnosis not present

## 2022-09-09 DIAGNOSIS — Z13220 Encounter for screening for lipoid disorders: Secondary | ICD-10-CM

## 2022-09-09 DIAGNOSIS — E538 Deficiency of other specified B group vitamins: Secondary | ICD-10-CM

## 2022-09-09 DIAGNOSIS — Z1159 Encounter for screening for other viral diseases: Secondary | ICD-10-CM

## 2022-09-09 MED ORDER — DILTIAZEM HCL ER COATED BEADS 240 MG PO CP24: 240.0000 mg | ORAL_CAPSULE | Freq: Every day | ORAL | 3 refills | Status: DC

## 2022-09-09 MED ORDER — LIDOCAINE 5 % EX PTCH: 1.0000 | MEDICATED_PATCH | CUTANEOUS | 0 refills | Status: DC

## 2022-09-09 MED ORDER — OXYBUTYNIN CHLORIDE ER 15 MG PO TB24: 15.0000 mg | ORAL_TABLET | Freq: Every day | ORAL | 3 refills | Status: DC

## 2022-09-09 MED ORDER — PANTOPRAZOLE SODIUM 40 MG PO TBEC: 40.0000 mg | DELAYED_RELEASE_TABLET | Freq: Two times a day (BID) | ORAL | 3 refills | Status: DC

## 2022-09-09 MED ORDER — DONEPEZIL HCL 5 MG PO TABS: 5.0000 mg | ORAL_TABLET | Freq: Every day | ORAL | 3 refills | Status: DC

## 2022-09-09 MED ORDER — VITAMIN D (ERGOCALCIFEROL) 1.25 MG (50000 UNIT) PO CAPS: 50000.0000 [IU] | ORAL_CAPSULE | ORAL | 0 refills | Status: DC

## 2022-09-09 NOTE — Patient Instructions (Signed)

## 2022-09-09 NOTE — Assessment & Plan Note (Signed)
MMSE score of 14 Trial on Donepezil 5 mg  Vit D, Vit B12 ordered rule out any deficiency  Urinalysis and urine culture to rule out UTI  Awaiting results Referral to Neurology for further evaluation

## 2022-09-09 NOTE — Assessment & Plan Note (Addendum)
Vitals:   09/09/22 0953 09/09/22 1019  BP: (!) 162/99 (!) 160/90   Blood pressure not controlled, Labs ordered in today's visit Patient reports taking blood pressure about 15 mins ago Advise patient to follow up in 1 week with at home blood pressure readings to evaluate trends- send reading through my chart. Current regimen includes atenolol 50 mg daily and Cardizem 240 mg daily Continued discussion on DASH diet, low sodium diet and maintain a exercise routine for 150 minutes per week.  Follow up with Cardiology for worsening symptoms

## 2022-09-09 NOTE — Progress Notes (Signed)
New Patient Office Visit   Subjective   Patient ID: [name], female    DOB: 1945-03-11  Age: 77 y.o. MRN: [id number]  CC:  Chief Complaint  Patient presents with   Establish Care   Abdominal Pain    All over stomach pain with an ulcer and had gastric bypass.    Immunizations    Needs tdap, shingles,    Medicare Wellness    Is in need of annual wellness and lab screening along with medication refills.    Anxiety    Pt. Is very anxious causing B/P is abnormal.      HPI [name] 77 year old female, presents to establish care. She  has a past medical history of Anemia, Anxiety, Arthritis, Chronic back pain, Depression, GERD (gastroesophageal reflux disease), Headache(784.0), Hiatal hernia, History of blood transfusion, History of GI bleed, Hypothyroidism, Mitral regurgitation, Obsessive-compulsive disorder, Persistent atrial fibrillation (HCC), Presence of Watchman left atrial appendage closure device (12/03/2021), Recurrent upper respiratory infection (URI), and Sleep apnea.  She is here for evaluation and treatment of cognitive problems. She is accompanied by her daughter. Primary caregiver is patient and daughter. The family and the patient identify problems with changes in short term memory. Family and patient report problems with forgetfulness, unable to finish sentences or explain her train of thought. Family and patient are concerned about worsening cognitive decline. Medication administration: patient self medicates, family monitors medication usage, and family sets up medications   Functional Assessment:  She is independent in the following: ambulation, bathing and hygiene, and feeding Requires assistance with the following: continence     Hypertension This is a chronic problem. The problem has been waxing and waning since onset. The problem is uncontrolled. Pertinent negatives include no blurred vision, chest pain, headaches or shortness of breath. Risk factors for  coronary artery disease include dyslipidemia and obesity. Past treatments include beta blockers and calcium channel blockers. The current treatment provides mild improvement. Compliance problems include exercise.  Hypertensive end-organ damage includes kidney disease. Identifiable causes of hypertension include chronic renal disease and a thyroid problem.      Outpatient Encounter Medications as of 09/09/2022  Medication Sig   atenolol (TENORMIN) 50 MG tablet TAKE 1 TABLET(50 MG) BY MOUTH DAILY   cyanocobalamin (VITAMIN B12) 1000 MCG/ML injection Inject 1,000 mcg into the muscle every 30 (thirty) days.   donepezil (ARICEPT) 5 MG tablet Take 1 tablet (5 mg total) by mouth at bedtime.   levothyroxine (SYNTHROID, LEVOTHROID) 75 MCG tablet Take 75 mcg by mouth daily before breakfast.   lidocaine (LIDODERM) 5 % Place 1 patch onto the skin daily. Remove & Discard patch within 12 hours or as directed by MD   oxybutynin (DITROPAN XL) 15 MG 24 hr tablet Take 1 tablet (15 mg total) by mouth at bedtime.   venlafaxine (EFFEXOR) 75 MG tablet Take 75 mg by mouth 2 (two) times daily.    [DISCONTINUED] D3-50 1.25 MG (50000 UT) capsule Take 50,000 Units by mouth once a week.   [DISCONTINUED] oxybutynin (DITROPAN-XL) 5 MG 24 hr tablet Take 5 mg by mouth daily.   [DISCONTINUED] pantoprazole (PROTONIX) 40 MG tablet TAKE 1 TABLET(40 MG) BY MOUTH TWICE DAILY BEFORE A MEAL   [DISCONTINUED] Vitamin D, Ergocalciferol, (DRISDOL) 50000 units CAPS capsule Take 50,000 Units by mouth every Monday.   diltiazem (CARDIZEM CD) 240 MG 24 hr capsule Take 1 capsule (240 mg total) by mouth daily.   pantoprazole (PROTONIX) 40 MG tablet Take 1  tablet (40 mg total) by mouth 2 (two) times daily.   [START ON 09/13/2022] Vitamin D, Ergocalciferol, (DRISDOL) 1.25 MG (50000 UNIT) CAPS capsule Take 1 capsule (50,000 Units total) by mouth every Monday.   [DISCONTINUED] amoxicillin (AMOXIL) 500 MG capsule Take 1 capsule (500 mg total) by mouth 3  (three) times daily. (Patient not taking: Reported on 09/09/2022)   [DISCONTINUED] diltiazem (CARDIZEM CD) 240 MG 24 hr capsule TAKE 1 CAPSULE(240 MG) BY MOUTH DAILY (Patient not taking: Reported on 09/09/2022)   No facility-administered encounter medications on file as of 09/09/2022.    Past Surgical History:  Procedure Laterality Date   ABDOMINAL HYSTERECTOMY     ANTERIOR CERVICAL DECOMP/DISCECTOMY FUSION  12/09/2010   Procedure: ANTERIOR CERVICAL DECOMPRESSION/DISCECTOMY FUSION 3 LEVELS;  Surgeon: Cristi Loron;  Location: [facility];  Service: Neurosurgery;  Laterality: N/A;  Cervical three-four,Cervical four-five,Cervical Five-Six,Cervical Six-Seven ANTERIOR CERVICAL DECOMPRESSION WITH FUSION INTERBODY PROTHESIS PLATING AND BONEGRAFT   APPENDECTOMY     BIOPSY N/A 11/14/2014   Procedure: GASTRIC BIOPSY;  Surgeon: Corbin Ade, MD;  Location: [facility];  Service: Endoscopy;  Laterality: N/A;   CESAREAN SECTION     CHOLECYSTECTOMY     COLONOSCOPY WITH PROPOFOL N/A 11/14/2014   RMR: redundant colon but otherwise normal   COLONOSCOPY WITH PROPOFOL N/A 05/12/2020   Dr. Jena Gauss;  Seven 4 to 7 mm polyps, diverticulosis in the sigmoid colon. Pathology with tubular adenomas. Recommended repeat in 3 years.   ESOPHAGOGASTRODUODENOSCOPY (EGD) WITH PROPOFOL N/A 11/14/2014   RMR"s/p gastric surgery/anastomtic ulcer   ESOPHAGOGASTRODUODENOSCOPY (EGD) WITH PROPOFOL N/A 09/15/2017   Procedure: ESOPHAGOGASTRODUODENOSCOPY (EGD) WITH PROPOFOL;  Surgeon: Corbin Ade, MD;  Location: [facility];  Service: Endoscopy;  Laterality: N/A;  11:30am   ESOPHAGOGASTRODUODENOSCOPY (EGD) WITH PROPOFOL N/A 04/04/2019   2 cm ulcer crater at anastomosis with smaller satellite areas of ulceration in setting of NSAIDs/Goody powders, no bleeding stigmata.   ESOPHAGOGASTRODUODENOSCOPY (EGD) WITH PROPOFOL N/A 06/13/2019   Rourk: Prior gastric bypass surgery with Billroth II type anatomy, otherwise normal-appearing  residual upper GI tract.   EYE SURGERY     bilateral cataracts removed, w/IOL   GASTRIC BYPASS  2008   JOINT REPLACEMENT     07/2010 &  2002- respectively- both knees    LEFT ATRIAL APPENDAGE OCCLUSION N/A 12/03/2021   Procedure: LEFT ATRIAL APPENDAGE OCCLUSION;  Surgeon: Tonny Bollman, MD;  Location: [facility] INVASIVE CV LAB;  Service: Cardiovascular;  Laterality: N/A;   MALONEY DILATION N/A 09/15/2017   Procedure: Elease Hashimoto DILATION;  Surgeon: Corbin Ade, MD;  Location: [facility];  Service: Endoscopy;  Laterality: N/A;   PARATHYROIDECTOMY     PERIPHERALLY INSERTED CENTRAL CATHETER INSERTION     PICC line for treatment of MRSA   POLYPECTOMY  05/12/2020   Procedure: POLYPECTOMY;  Surgeon: Corbin Ade, MD;  Location: [facility];  Service: Endoscopy;;   TEE WITHOUT CARDIOVERSION N/A 12/03/2021   Procedure: TRANSESOPHAGEAL ECHOCARDIOGRAM (TEE);  Surgeon: Tonny Bollman, MD;  Location: [facility] INVASIVE CV LAB;  Service: Cardiovascular;  Laterality: N/A;   TONSILLECTOMY     as a child    Review of Systems  Constitutional:  Negative for chills and fever.  Eyes:  Negative for blurred vision.  Respiratory:  Negative for shortness of breath.   Cardiovascular:  Negative for chest pain.  Gastrointestinal:  Negative for nausea and vomiting.  Genitourinary:  Positive for frequency. Negative for dysuria, flank pain, hematuria and urgency.  Neurological:  Negative for dizziness  and headaches.      Objective    BP (!) 160/90 (BP Location: Right Arm, Patient Position: Sitting, Cuff Size: Normal)   Pulse 73   Ht 5\' 6"  (1.676 m)   Wt 179 lb 0.6 oz (81.2 kg)   SpO2 95%   BMI 28.90 kg/m   Physical Exam Constitutional:      General: She is not in acute distress.    Appearance: Normal appearance. She is not ill-appearing, toxic-appearing or diaphoretic.  HENT:     Head: Normocephalic.     Right Ear: Tympanic membrane normal.     Left Ear: Tympanic membrane normal.     Nose: Nose  normal.     Mouth/Throat:     Mouth: Mucous membranes are moist.  Eyes:     General:        Right eye: No discharge.        Left eye: No discharge.     Conjunctiva/sclera: Conjunctivae normal.     Pupils: Pupils are equal, round, and reactive to light.  Cardiovascular:     Rate and Rhythm: Normal rate.     Pulses: Normal pulses.  Pulmonary:     Effort: Pulmonary effort is normal. No respiratory distress.     Breath sounds: Normal breath sounds.  Abdominal:     General: Bowel sounds are normal.     Palpations: Abdomen is soft.     Tenderness: There is no abdominal tenderness. There is no right CVA tenderness, left CVA tenderness or guarding.  Musculoskeletal:     Cervical back: Normal range of motion.  Skin:    General: Skin is warm and dry.     Capillary Refill: Capillary refill takes less than 2 seconds.  Neurological:     Mental Status: She is alert.     Coordination: Coordination abnormal.     Gait: Gait abnormal.  Psychiatric:        Mood and Affect: Mood normal.        Behavior: Behavior normal.       Assessment & Plan:  Primary hypertension -     Microalbumin / creatinine urine ratio -     CMP14+EGFR -     CBC with Differential/Platelet  Screening for diabetes mellitus -     Hemoglobin A1c  Screening for lipid disorders -     Lipid panel  Screening for thyroid disorder -     TSH + free T4  Vitamin D deficiency -     VITAMIN D 25 Hydroxy (Vit-D Deficiency, Fractures)  Need for hepatitis C screening test -     Hepatitis C antibody  Gastroesophageal reflux disease, unspecified whether esophagitis present -     Pantoprazole Sodium; Take 1 tablet (40 mg total) by mouth 2 (two) times daily.  Dispense: 90 tablet; Refill: 3  Vitamin B12 deficiency -     Vitamin B12  Screening for osteoporosis -     DG Bone Density; Future  Essential hypertension Assessment & Plan: Vitals:   09/09/22 0953 09/09/22 1019  BP: (!) 162/99 (!) 160/90   Blood pressure not  controlled, Labs ordered in today's visit Patient reports taking blood pressure about 15 mins ago Advise patient to follow up in 1 week with at home blood pressure readings to evaluate trends- send reading through my chart. Current regimen includes atenolol 50 mg daily and Cardizem 240 mg daily Continued discussion on DASH diet, low sodium diet and maintain a exercise routine for 150 minutes per week.  Follow up with Cardiology for worsening symptoms    Cognitive changes Assessment & Plan: MMSE score of 14 Trial on Donepezil 5 mg  Vit D, Vit B12 ordered rule out any deficiency  Urinalysis and urine culture to rule out UTI  Awaiting results Referral to Neurology for further evaluation     Urinary symptom or sign -     Urinalysis -     Urine Culture  Other orders -     oxyBUTYnin Chloride ER; Take 1 tablet (15 mg total) by mouth at bedtime.  Dispense: 30 tablet; Refill: 3 -     dilTIAZem HCl ER Coated Beads; Take 1 capsule (240 mg total) by mouth daily.  Dispense: 90 capsule; Refill: 3 -     Vitamin D (Ergocalciferol); Take 1 capsule (50,000 Units total) by mouth every Monday.  Dispense: 15 capsule; Refill: 0 -     Lidocaine; Place 1 patch onto the skin daily. Remove & Discard patch within 12 hours or as directed by MD  Dispense: 30 patch; Refill: 0 -     Donepezil HCl; Take 1 tablet (5 mg total) by mouth at bedtime.  Dispense: 30 tablet; Refill: 3    Return in about 3 months (around 12/10/2022), or if symptoms worsen or fail to improve, for Cognitive Function , chronic follow-up.   Cruzita Lederer Newman Nip, FNP

## 2022-09-18 ENCOUNTER — Other Ambulatory Visit: Payer: Self-pay | Admitting: Family Medicine

## 2022-09-18 DIAGNOSIS — R4189 Other symptoms and signs involving cognitive functions and awareness: Secondary | ICD-10-CM

## 2022-09-18 LAB — VITAMIN B12: Vitamin B-12: 342 pg/mL (ref 232–1245)

## 2022-09-18 NOTE — Progress Notes (Signed)
Hello Please mail labs results to patient.  Thank you.

## 2022-09-20 LAB — LIPID PANEL
Chol/HDL Ratio: 2.5 ratio (ref 0.0–4.4)
Cholesterol, Total: 200 mg/dL — ABNORMAL HIGH (ref 100–199)
HDL: 80 mg/dL (ref >39)
LDL Chol Calc (NIH): 103 mg/dL — ABNORMAL HIGH (ref 0–99)
Triglycerides: 95 mg/dL (ref 0–149)
VLDL Cholesterol Cal: 17 mg/dL (ref 5–40)

## 2022-09-20 LAB — CBC WITH DIFFERENTIAL/PLATELET
Basophils Absolute: 0.1 10*3/uL (ref 0.0–0.2)
Basos: 1 %
EOS (ABSOLUTE): 0.2 10*3/uL (ref 0.0–0.4)
Eos: 2 %
Hematocrit: 42.4 % (ref 34.0–46.6)
Hemoglobin: 13.7 g/dL (ref 11.1–15.9)
Immature Grans (Abs): 0 10*3/uL (ref 0.0–0.1)
Immature Granulocytes: 0 %
Lymphocytes Absolute: 2.2 10*3/uL (ref 0.7–3.1)
Lymphs: 29 %
MCH: 30.6 pg (ref 26.6–33.0)
MCHC: 32.3 g/dL (ref 31.5–35.7)
MCV: 95 fL (ref 79–97)
Monocytes Absolute: 0.6 10*3/uL (ref 0.1–0.9)
Monocytes: 8 %
Neutrophils Absolute: 4.6 10*3/uL (ref 1.4–7.0)
Neutrophils: 60 %
Platelets: 169 10*3/uL (ref 150–450)
RBC: 4.48 x10E6/uL (ref 3.77–5.28)
RDW: 12.3 % (ref 11.7–15.4)
WBC: 7.7 10*3/uL (ref 3.4–10.8)

## 2022-09-20 LAB — CMP14+EGFR
ALT: 6 IU/L (ref 0–32)
AST: 16 IU/L (ref 0–40)
Albumin: 4.2 g/dL (ref 3.8–4.8)
Alkaline Phosphatase: 82 IU/L (ref 44–121)
BUN/Creatinine Ratio: 19 (ref 12–28)
BUN: 24 mg/dL (ref 8–27)
Bilirubin Total: 0.5 mg/dL (ref 0.0–1.2)
CO2: 24 mmol/L (ref 20–29)
Calcium: 9.9 mg/dL (ref 8.7–10.3)
Chloride: 103 mmol/L (ref 96–106)
Creatinine, Ser: 1.29 mg/dL — ABNORMAL HIGH (ref 0.57–1.00)
Globulin, Total: 2.2 g/dL (ref 1.5–4.5)
Glucose: 101 mg/dL — ABNORMAL HIGH (ref 70–99)
Potassium: 3.9 mmol/L (ref 3.5–5.2)
Sodium: 141 mmol/L (ref 134–144)
Total Protein: 6.4 g/dL (ref 6.0–8.5)
eGFR: 43 mL/min/{1.73_m2} — ABNORMAL LOW (ref >59)

## 2022-09-20 LAB — TSH+FREE T4
Free T4: 1.25 ng/dL (ref 0.82–1.77)
TSH: 2.86 u[IU]/mL (ref 0.450–4.500)

## 2022-09-20 LAB — HEMOGLOBIN A1C
Est. average glucose Bld gHb Est-mCnc: 126 mg/dL
Hgb A1c MFr Bld: 6 % — ABNORMAL HIGH (ref 4.8–5.6)

## 2022-09-20 LAB — MICROALBUMIN / CREATININE URINE RATIO
Creatinine, Urine: 87.4 mg/dL
Microalb/Creat Ratio: 21 mg/g{creat} (ref 0–29)
Microalbumin, Urine: 18.3 ug/mL

## 2022-09-20 LAB — HEPATITIS C ANTIBODY: Hep C Virus Ab: NONREACTIVE

## 2022-09-20 LAB — VITAMIN D 25 HYDROXY (VIT D DEFICIENCY, FRACTURES): Vit D, 25-Hydroxy: 57.9 ng/mL (ref 30.0–100.0)

## 2022-09-22 ENCOUNTER — Encounter (HOSPITAL_COMMUNITY): Payer: Self-pay | Admitting: Hematology & Oncology

## 2022-09-22 ENCOUNTER — Encounter: Payer: Self-pay | Admitting: Family Medicine

## 2022-09-23 ENCOUNTER — Telehealth: Payer: Self-pay | Admitting: Cardiology

## 2022-09-23 NOTE — Telephone Encounter (Signed)
Pt c/o BP issue: STAT if pt c/o blurred vision, one-sided weakness or slurred speech  1. What are your last 5 BP readings? NA  2. Are you having any other symptoms (ex. Dizziness, headache, blurred vision, passed out)? Dizziness and nausea  3. What is your BP issue? Pt's daughter states that pt's BP has been elevated as well as oxygen level running low. Daughter would like a callback regarding this matter.Please advise

## 2022-09-23 NOTE — Telephone Encounter (Signed)
I spoke with daughter who reports that her mom has been under a lot of stress in her home.There have been two men working with HVAC in and out of the home all day.Daughter feels this is stressing her out. She says if you ask her she will tell you she has nausea and is dizzy all the time.She saw pcp 09/09/22 and they were not able to take BP's. She took her BP twice last night, once she had systolic around 159. She rechecked it after she made her walk from one end of the house to another and it was "128/something" She did note that her pulse ox was reading 90-91 %,she is not on oxygen.  Daughter thinks she is more tired than usual and is going to check her for Covid. She will call me back.

## 2022-09-30 NOTE — Progress Notes (Signed)
Referring Provider: Wylene Men* Primary Care Physician:  Rica Records, FNP Primary GI Physician: Dr. Jena Gauss  Chief Complaint  Patient presents with   Follow-up    Follow up on GERD and refills on her medications    HPI:   [name] is a 77 y.o. female with history of gastric bypass in 2007, IDA, anastomotic ulcer in 2016 and March 2021 with follow-up EGD in May 2021 with ulcer healed, GERD, chronic N/V, chronic constipation, adenomatous colon polyps due for surveillance colonoscopy in April 2025, chronic right sided abdominal pain with known right lateral abdominal wall hernia and right sided spigelian hernia, presenting today for follow-up    Last seen in the office 05/06/2021.  She had documented 26 pound weight loss over the last 9 months which was unintentional.. GERD was uncontrolled, likely influenced by dietary habits.  Ongoing daily nausea/vomiting felt to be multifactorial in the setting of uncontrolled GERD, uncontrolled constipation, chronic abdominal pain, known right sided abdominal hernia and right sided spigelian hernia.     Also query component of gastroparesis and daughter also reported mother had history of self-induced vomiting. Recommended changing Protonix to Prevacid 30 mg twice daily, Zofran every morning, gastric emptying study, start lactulose 45 mL daily as insurance was no longer going to cover Amitiza.  Recommended CT and updated labs to evaluate unintentional weight loss   Labs did not reveal any acute abnormalities.  She did have low normal B12 and daughter stated they have been out of B12 injections for several months.  Recommended reaching out to PCP.[name]  CT with no acute abnormalities.  She had a moderate amount of stool in her colon and a small abdominal wall hernia in the right lower quadrant containing containing fat without complicating factors. Pancreas is atrophic, but otherwise unremarkable.   Gastric emptying study was  normal.   Patient was to follow-up with Dr. Jena Gauss in June 2023, but no-show to her appointment.    Today: Presents with daughter.  GERD: Patient went back to pantoprazole she stated Prevacid did not work as well. Pantoprazole is working ok. Still with breakthrough every couple days. Has still been eating a lot of spaghetti and meatballs, fried foods, and tomatoes. Rare carbonated beverages.   Also reporting solid food dysphagia for the last few months. No regurgitation.   Nausea/vomiting: Continues with daily nausea. Occasional vomiting of liquid. No real change over the years. No weight loss. Ha actually gained weight.  Doesn't take anything for nausea.  States she took something that did help her, but had risk of causing involuntary movements and she already has trouble with a lot of mouth movement, so she opted not to continue with this medication.  This was prescribed by her primary care provider a long time ago and she cannot room for the name.   Chronic constipation: Can go 3-4 days without a BM.  Not taking anything for this.  Amitiza worked well, but it is not affordable.  Does not want to take lactulose as it makes her more nauseated.  No BRBPR or melena.  Abdominal pain: Continues with chronic abdominal pain, unchanged.  Worse on the right side, but can be anywhere in her abdomen.  She does note some improvement when she is able to have a good bowel movement.  Overall, pain does not seem to be limiting her daily activities.  She does take Tylenol as needed for pain.   No NSAIDs.  Colonoscopy 05/12/2020: seven 4-7 mm polyps removed (tubular adenomas), diverticulosis in the sigmoid colon.  Recommended repeat colonoscopy in 3 years.  Dr. Jena Gauss also recommended patient see bariatric surgeon for consideration of repair of right sided spigelian hernia seen on CT which likely explains her right-sided abdominal pain.   Last EGD 06/13/2019:Prior gastric bypass surgery with  Billroth II type anatomy, otherwise normal-appearing residual upper GI tract.  Past Medical History:  Diagnosis Date   Anemia    Anxiety    Arthritis    Chronic back pain    Spondylosis and stenosis   Depression    GERD (gastroesophageal reflux disease)    Headache(784.0)    Hiatal hernia    History of blood transfusion    History of GI bleed    Hypothyroidism    Mitral regurgitation    Obsessive-compulsive disorder    Persistent atrial fibrillation (HCC)    Presence of Watchman left atrial appendage closure device 12/03/2021   31mm Watchman FLX with Dr. Excell Seltzer   Recurrent upper respiratory infection (URI)    Sleep apnea     Past Surgical History:  Procedure Laterality Date   ABDOMINAL HYSTERECTOMY     ANTERIOR CERVICAL DECOMP/DISCECTOMY FUSION  12/09/2010   Procedure: ANTERIOR CERVICAL DECOMPRESSION/DISCECTOMY FUSION 3 LEVELS;  Surgeon: Cristi Loron;  Location: [facility];  Service: Neurosurgery;  Laterality: N/A;  Cervical three-four,Cervical four-five,Cervical Five-Six,Cervical Six-Seven ANTERIOR CERVICAL DECOMPRESSION WITH FUSION INTERBODY PROTHESIS PLATING AND BONEGRAFT   APPENDECTOMY     BIOPSY N/A 11/14/2014   Procedure: GASTRIC BIOPSY;  Surgeon: Corbin Ade, MD;  Location: [facility];  Service: Endoscopy;  Laterality: N/A;   CESAREAN SECTION     CHOLECYSTECTOMY     COLONOSCOPY WITH PROPOFOL N/A 11/14/2014   RMR: redundant colon but otherwise normal   COLONOSCOPY WITH PROPOFOL N/A 05/12/2020   Dr. Jena Gauss;  Seven 4 to 7 mm polyps, diverticulosis in the sigmoid colon. Pathology with tubular adenomas. Recommended repeat in 3 years.   ESOPHAGOGASTRODUODENOSCOPY (EGD) WITH PROPOFOL N/A 11/14/2014   RMR"s/p gastric surgery/anastomtic ulcer   ESOPHAGOGASTRODUODENOSCOPY (EGD) WITH PROPOFOL N/A 09/15/2017   Procedure: ESOPHAGOGASTRODUODENOSCOPY (EGD) WITH PROPOFOL;  Surgeon: Corbin Ade, MD;  Location: [facility];  Service: Endoscopy;  Laterality: N/A;  11:30am    ESOPHAGOGASTRODUODENOSCOPY (EGD) WITH PROPOFOL N/A 04/04/2019   2 cm ulcer crater at anastomosis with smaller satellite areas of ulceration in setting of NSAIDs/Goody powders, no bleeding stigmata.   ESOPHAGOGASTRODUODENOSCOPY (EGD) WITH PROPOFOL N/A 06/13/2019   Rourk: Prior gastric bypass surgery with Billroth II type anatomy, otherwise normal-appearing residual upper GI tract.   EYE SURGERY     bilateral cataracts removed, w/IOL   GASTRIC BYPASS  2008   JOINT REPLACEMENT     07/2010 &  2002- respectively- both knees    LEFT ATRIAL APPENDAGE OCCLUSION N/A 12/03/2021   Procedure: LEFT ATRIAL APPENDAGE OCCLUSION;  Surgeon: Tonny Bollman, MD;  Location: [facility]VASIVE CV LAB;  Service: Cardiovascular;  Laterality: N/A;   MALONEY DILATION N/A 09/15/2017   Procedure: Elease Hashimoto DILATION;  Surgeon: Corbin Ade, MD;  Location: [facility];  Service: Endoscopy;  Laterality: N/A;   PARATHYROIDECTOMY     PERIPHERALLY INSERTED CENTRAL CATHETER INSERTION     PICC line for treatment of MRSA   POLYPECTOMY  05/12/2020   Procedure: POLYPECTOMY;  Surgeon: Corbin Ade, MD;  Location: [facility];  Service: Endoscopy;;   TEE WITHOUT CARDIOVERSION N/A 12/03/2021   Procedure: TRANSESOPHAGEAL ECHOCARDIOGRAM (TEE);  Surgeon: Excell Seltzer,  Casimiro Needle, MD;  Location: [facility] INVASIVE CV LAB;  Service: Cardiovascular;  Laterality: N/A;   TONSILLECTOMY     as a child    Current Outpatient Medications  Medication Sig Dispense Refill   atenolol (TENORMIN) 50 MG tablet TAKE 1 TABLET(50 MG) BY MOUTH DAILY 90 tablet 3   cyanocobalamin (VITAMIN B12) 1000 MCG/ML injection Inject 1,000 mcg into the muscle every 30 (thirty) days.     diltiazem (CARDIZEM CD) 240 MG 24 hr capsule Take 1 capsule (240 mg total) by mouth daily. 90 capsule 3   donepezil (ARICEPT) 5 MG tablet Take 1 tablet (5 mg total) by mouth at bedtime. 30 tablet 3   levothyroxine (SYNTHROID, LEVOTHROID) 75 MCG tablet Take 75 mcg by mouth daily before breakfast.      lidocaine (LIDODERM) 5 % Place 1 patch onto the skin daily. Remove & Discard patch within 12 hours or as directed by MD 30 patch 0   oxybutynin (DITROPAN XL) 15 MG 24 hr tablet Take 1 tablet (15 mg total) by mouth at bedtime. 30 tablet 3   pantoprazole (PROTONIX) 40 MG tablet Take 1 tablet (40 mg total) by mouth 2 (two) times daily. 90 tablet 3   venlafaxine (EFFEXOR) 75 MG tablet Take 75 mg by mouth 2 (two) times daily.      Vitamin D, Ergocalciferol, (DRISDOL) 1.25 MG (50000 UNIT) CAPS capsule Take 1 capsule (50,000 Units total) by mouth every Monday. 15 capsule 0   ondansetron (ZOFRAN) 4 MG tablet Take 1 tablet (4 mg total) by mouth every 8 (eight) hours as needed for nausea or vomiting. 30 tablet 1   No current facility-administered medications for this visit.    Allergies as of 10/01/2022 - Review Complete 10/01/2022  Allergen Reaction Noted   Benzodiazepines Other (See Comments) 12/03/2011   Codeine Nausea Only    Escitalopram oxalate Nausea Only 12/02/2010   Nsaids Other (See Comments) 12/22/2012   Sertraline hcl Nausea Only    Statins Nausea And Vomiting    Tramadol hcl Other (See Comments) 12/22/2012    Family History  Problem Relation Age of Onset   Anxiety disorder Mother    Breast cancer Mother    Heart attack Father    Anxiety disorder Sister    Breast cancer Sister    Bipolar disorder Daughter    OCD Daughter    Anesthesia problems Neg Hx    Hypotension Neg Hx    Malignant hyperthermia Neg Hx    Pseudochol deficiency Neg Hx    Dementia Neg Hx    Alcohol abuse Neg Hx    Drug abuse Neg Hx    Depression Neg Hx    Paranoid behavior Neg Hx    Schizophrenia Neg Hx    Seizures Neg Hx    Sexual abuse Neg Hx    Physical abuse Neg Hx    Colon cancer Neg Hx     Social History   Socioeconomic History   Marital status: Widowed    Spouse name: Not on file   Number of children: 4   Years of education: Not on file   Highest education level: Not on file   Occupational History   Not on file  Tobacco Use   Smoking status: Former    Current packs/day: 0.00    Average packs/day: 1 pack/day for 30.0 years (30.0 ttl pk-yrs)    Types: Cigarettes    Start date: 01/19/1968    Quit date: 01/18/1998    Years since quitting: 24.7  Smokeless tobacco: Never  Vaping Use   Vaping status: Never Used  Substance and Sexual Activity   Alcohol use: No   Drug use: No   Sexual activity: Not on file    Comment: Hysterectomy  Other Topics Concern   Not on file  Social History Narrative   Not on file   Social Determinants of Health   Financial Resource Strain: Not on file  Food Insecurity: Not on file  Transportation Needs: Not on file  Physical Activity: Not on file  Stress: Not on file  Social Connections: Not on file    Review of Systems: Gen: Denies fever, chills, cold flulike symptoms, presyncope, syncope. CV: Denies chest pain, palpitations. Resp: Denies dyspnea, cough.  GI: See HPI Derm: Denies rash. Psych: Denies depression, anxiety. Heme: Denies bruising, bleeding.  Physical Exam: BP (!) 141/90   Pulse 72   Temp 98.6 F (37 C)   Ht 5\' 5"  (1.651 m)   Wt 177 lb (80.3 kg)   BMI 29.45 kg/m  General:   Alert and oriented. No distress noted. Pleasant and cooperative.  Head:  Normocephalic and atraumatic. Eyes:  Conjuctiva clear without scleral icterus. Heart:  S1, S2 present without murmurs appreciated. Lungs:  Clear to auscultation bilaterally. No wheezes, rales, or rhonchi. No distress.  Abdomen:  +BS, soft, non-tender and non-distended. No rebound or guarding. No HSM or masses noted. Msk:  Symmetrical without gross deformities. Normal posture. Extremities:  Without edema. Neurologic:  Alert and  oriented x4 Psych:  Normal mood and affect.    Assessment:  77 y.o. female with history of gastric bypass in 2007, IDA, anastomotic ulcer in 2016 and March 2021 with follow-up EGD in May 2021 with ulcer healed, GERD, chronic N/V,  chronic constipation, adenomatous colon polyps due for surveillance colonoscopy in April 2025, chronic abdominal pain suspected to be secondary to right lateral abdominal wall hernia and right sided spigelian hernia, presenting today for follow-up.   GERD:  Not adequately managed on pantoprazole 40 mg twice daily.  Previously tried Prevacid, but went back to pantoprazole per patient's request due to inadequate response.  I suspect a lot of her symptoms are dietary related.  Offered trial of a different medication versus focusing on dietary changes first, and patient opted for dietary management.  This was discussed with patient's daughter who was present with her today.  Dysphagia:  Few month history of solid food dysphagia.  No regurgitation.  Symptoms may be related to uncontrolled GERD versus development of esophageal web, ring, stricture.  Discussed EGD versus barium pill esophagram and patient ultimately opted for barium pill esophagram.  Nausea/vomiting: Chronic, unchanged.  May be multifactorial in the setting of uncontrolled GERD, chronic pain, chronic constipation.  Encouragingly, she has not lost any weight due to this.  Gastric emptying study last year was normal.  CT last year with no acute abnormalities, moderate stool throughout the colon.  We will focus on GERD and constipation management as discussed separately and also try her on Zofran.  Notably, patient's daughter states PCP previously had patient on a medication that worked very well, but had side effect of involuntary movements and patient already has trouble with involuntary movement of her mouth, so they preferred not to continue this medication.  I suspect this was Reglan.  Constipation:  Chronic.  Not adequately managed as she is not currently taking anything for this.  No alarm symptoms.  Insurance is not covering any prescriptive agents at an affordable rate and had  recommended lactulose, patient states this makes her more  nauseated.  I will try her on MiraLAX daily as she has never taken this consistently.  History of PUD:  History of anastomotic ulcer that had healed on her last EGD in May 2021.  After patient left the office, I reviewed CT A/P with contrast completed June 2023 that showed wall thickening, inflammatory change, small gas and fluid collection near the gastric pouch jejunal anastomosis, suspicious for ulcer.  Considering these findings, recommend that we proceed with an EGD for further evaluation.  If she is agreeable to proceeding with an EGD, we can cancel barium pill esophagram as we will evaluate her dysphagia at the time of EGD. Tried reaching patient after she left the office, but was unsuccessful. Will have nursing staff reach her on Monday.     Plan:  Barium pill esophagram Will need to reach patient regarding possible EGD to follow-up on abnormal CT findings.  Continue pantoprazole 40 mg twice daily 30 minutes before breakfast and dinner. GERD diet/lifestyle recommendations: Avoid fried, fatty, greasy, spicy, citrus foods. Avoid caffeine and carbonated beverages. Avoid chocolate. Try eating 4-6 small meals a day rather than 3 large meals. Do not eat within 3 hours of laying down. Prop head of bed up on wood or bricks to create a 6 inch incline. Start Zofran 4 mg every morning.  May take additional dose every 8 hours as needed for nausea/vomiting. Start MiraLAX 17 g daily.  Requested patient/daughter let me know if constipation is not well-controlled after couple of weeks and we can increase this to twice daily. Follow-up in 3 months or sooner if needed.   Ermalinda Memos, PA-C [facility]enter[facility] 10/01/2022

## 2022-10-01 ENCOUNTER — Ambulatory Visit (INDEPENDENT_AMBULATORY_CARE_PROVIDER_SITE_OTHER): Payer: PPO | Admitting: Gastroenterology

## 2022-10-01 ENCOUNTER — Encounter: Payer: Self-pay | Admitting: Gastroenterology

## 2022-10-01 ENCOUNTER — Telehealth: Payer: Self-pay | Admitting: Gastroenterology

## 2022-10-01 ENCOUNTER — Telehealth: Payer: Self-pay | Admitting: *Deleted

## 2022-10-01 VITALS — BP 141/90 | HR 72 | Temp 98.6°F | Ht 65.0 in | Wt 177.0 lb

## 2022-10-01 DIAGNOSIS — K219 Gastro-esophageal reflux disease without esophagitis: Secondary | ICD-10-CM

## 2022-10-01 DIAGNOSIS — Z8711 Personal history of peptic ulcer disease: Secondary | ICD-10-CM

## 2022-10-01 DIAGNOSIS — R1310 Dysphagia, unspecified: Secondary | ICD-10-CM | POA: Diagnosis not present

## 2022-10-01 DIAGNOSIS — R112 Nausea with vomiting, unspecified: Secondary | ICD-10-CM | POA: Diagnosis not present

## 2022-10-01 DIAGNOSIS — K5900 Constipation, unspecified: Secondary | ICD-10-CM

## 2022-10-01 MED ORDER — ONDANSETRON HCL 4 MG PO TABS: 4.0000 mg | ORAL_TABLET | Freq: Three times a day (TID) | ORAL | 1 refills | Status: DC | PRN

## 2022-10-01 NOTE — Telephone Encounter (Signed)
After patient left the office on Friday, I reviewed CT A/P with contrast completed June 2023 that showed wall thickening, inflammatory change, small gas and fluid collection near the gastric pouch jejunal anastomosis, suspicious for ulcer.  Considering these findings, recommend that we proceed with an EGD for further evaluation.  If she is agreeable to proceeding with an EGD, we can cancel barium pill esophagram as we will evaluate her dysphagia at the time of EGD.   I tried reaching patient/daughter after they left the office, but was unsuccessful.   Toni Amend, can you try reaching daughter/patient on Monday and relay the above recommendations?

## 2022-10-01 NOTE — Patient Instructions (Signed)
We will arrange for you to have a swallow study at [facility].   Continue pantoprazole 40 mg twice daily.  Be sure to take this medication 30 minutes before breakfast and 30 minutes before dinner.  Follow a GERD diet/lifestyle:  Avoid fried, fatty, greasy, spicy, citrus foods. Avoid tomato-based products. Avoid caffeine and carbonated beverages. Avoid chocolate. Try eating 4-6 small meals a day rather than 3 large meals. Do not eat within 3 hours of laying down. Prop head of bed up on wood or bricks to create a 6 inch incline.   For nausea: You can try taking Zofran 4 mg every morning.  You can take an additional Zofran every 8 hours as needed.  For constipation: Start MiraLAX 1 capful (17 g) daily in 8 ounces of water or other noncarbonated beverage of your choice.   I will plan to see back in the office in 3 months.  Do not hesitate to call if you have questions or concerns prior to your next visit.  It was good to see you today!    Ermalinda Memos, PA-C [facility]astroenter[facility]

## 2022-10-01 NOTE — Telephone Encounter (Signed)
[facility]  BPE scheduled for Friday 10/08/22, arrive at 10:45 am to check in, NPO starting at 8 am.

## 2022-10-04 ENCOUNTER — Encounter: Payer: Self-pay | Admitting: *Deleted

## 2022-10-05 ENCOUNTER — Encounter: Payer: Self-pay | Admitting: *Deleted

## 2022-10-06 ENCOUNTER — Telehealth: Payer: Self-pay | Admitting: Gastroenterology

## 2022-10-06 ENCOUNTER — Other Ambulatory Visit: Payer: Self-pay | Admitting: *Deleted

## 2022-10-06 NOTE — Telephone Encounter (Signed)
[name] was returning a call regarding patient. [phone]

## 2022-10-06 NOTE — Telephone Encounter (Signed)
Spoke to pt's daughter [facility]) informed her of recommendations.  She voiced understanding. She is agreeable to have EGD and cancel barium pill esophagram.

## 2022-10-06 NOTE — Telephone Encounter (Signed)
LMOVM to call back 

## 2022-10-06 NOTE — Telephone Encounter (Signed)
Mindy/Tammy:  Please arrange EGD +/- ED with Dr. Jena Gauss ASA 3 Dx: Dysphagia, GERD, Nausea/Vomiting, history of PUD, abnormal CT stomach.

## 2022-10-06 NOTE — Telephone Encounter (Signed)
See previous note

## 2022-10-07 ENCOUNTER — Other Ambulatory Visit: Payer: Self-pay

## 2022-10-07 NOTE — Telephone Encounter (Signed)
LMOVM to call back 

## 2022-10-08 ENCOUNTER — Other Ambulatory Visit (HOSPITAL_COMMUNITY): Payer: PPO

## 2022-10-11 NOTE — Telephone Encounter (Signed)
LMOVM to call and also sent mychart message

## 2022-10-15 ENCOUNTER — Other Ambulatory Visit: Payer: Self-pay | Admitting: Family Medicine

## 2022-10-15 ENCOUNTER — Telehealth: Payer: Self-pay | Admitting: Family Medicine

## 2022-10-15 MED ORDER — VENLAFAXINE HCL 75 MG PO TABS: 75.0000 mg | ORAL_TABLET | Freq: Two times a day (BID) | ORAL | 2 refills | Status: DC

## 2022-10-15 NOTE — Telephone Encounter (Signed)
sent 

## 2022-10-15 NOTE — Telephone Encounter (Signed)
Patient daughter calling says pt is needing refill on venlafaxine [facility]) 75 MG tablet [[id number]] Please advise [facility] [address], also use [facility] all future [facility]ills.  Thank you

## 2022-10-16 ENCOUNTER — Emergency Department (HOSPITAL_COMMUNITY): Payer: PPO

## 2022-10-16 ENCOUNTER — Encounter (HOSPITAL_COMMUNITY): Payer: Self-pay | Admitting: Hematology & Oncology

## 2022-10-16 ENCOUNTER — Other Ambulatory Visit: Payer: Self-pay

## 2022-10-16 ENCOUNTER — Encounter (HOSPITAL_COMMUNITY): Payer: Self-pay | Admitting: *Deleted

## 2022-10-16 ENCOUNTER — Emergency Department (HOSPITAL_COMMUNITY)
Admission: EM | Admit: 2022-10-16 | Discharge: 2022-10-16 | Disposition: A | Discharge status: Home / Self Care | Payer: PPO | Attending: Emergency Medicine | Admitting: Emergency Medicine

## 2022-10-16 DIAGNOSIS — R197 Diarrhea, unspecified: Secondary | ICD-10-CM | POA: Diagnosis not present

## 2022-10-16 DIAGNOSIS — R1030 Lower abdominal pain, unspecified: Secondary | ICD-10-CM | POA: Diagnosis present

## 2022-10-16 DIAGNOSIS — R110 Nausea: Secondary | ICD-10-CM | POA: Diagnosis not present

## 2022-10-16 DIAGNOSIS — R1032 Left lower quadrant pain: Secondary | ICD-10-CM | POA: Diagnosis not present

## 2022-10-16 DIAGNOSIS — R1084 Generalized abdominal pain: Secondary | ICD-10-CM

## 2022-10-16 DIAGNOSIS — R1031 Right lower quadrant pain: Secondary | ICD-10-CM | POA: Diagnosis not present

## 2022-10-16 LAB — URINALYSIS, ROUTINE W REFLEX MICROSCOPIC
Bilirubin Urine: NEGATIVE
Glucose, UA: NEGATIVE mg/dL
Hgb urine dipstick: NEGATIVE
Ketones, ur: NEGATIVE mg/dL
Leukocytes,Ua: NEGATIVE
Nitrite: NEGATIVE
Protein, ur: NEGATIVE mg/dL
Specific Gravity, Urine: 1.009 (ref 1.005–1.030)
pH: 6 (ref 5.0–8.0)

## 2022-10-16 LAB — COMPREHENSIVE METABOLIC PANEL
ALT: 11 U/L (ref 0–44)
AST: 14 U/L — ABNORMAL LOW (ref 15–41)
Albumin: 3.8 g/dL (ref 3.5–5.0)
Alkaline Phosphatase: 59 U/L (ref 38–126)
Anion gap: 9 (ref 5–15)
BUN: 25 mg/dL — ABNORMAL HIGH (ref 8–23)
CO2: 27 mmol/L (ref 22–32)
Calcium: 9.6 mg/dL (ref 8.9–10.3)
Chloride: 102 mmol/L (ref 98–111)
Creatinine, Ser: 1.43 mg/dL — ABNORMAL HIGH (ref 0.44–1.00)
GFR, Estimated: 38 mL/min — ABNORMAL LOW (ref >60)
Glucose, Bld: 135 mg/dL — ABNORMAL HIGH (ref 70–99)
Potassium: 4.8 mmol/L (ref 3.5–5.1)
Sodium: 138 mmol/L (ref 135–145)
Total Bilirubin: 0.5 mg/dL (ref 0.3–1.2)
Total Protein: 6.4 g/dL — ABNORMAL LOW (ref 6.5–8.1)

## 2022-10-16 LAB — CBC
HCT: 45.1 % (ref 36.0–46.0)
Hemoglobin: 14.2 g/dL (ref 12.0–15.0)
MCH: 31.9 pg (ref 26.0–34.0)
MCHC: 31.5 g/dL (ref 30.0–36.0)
MCV: 101.3 fL — ABNORMAL HIGH (ref 80.0–100.0)
Platelets: 177 10*3/uL (ref 150–400)
RBC: 4.45 MIL/uL (ref 3.87–5.11)
RDW: 13.2 % (ref 11.5–15.5)
WBC: 7 10*3/uL (ref 4.0–10.5)
nRBC: 0 % (ref 0.0–0.2)

## 2022-10-16 LAB — TROPONIN I (HIGH SENSITIVITY)
Troponin I (High Sensitivity): 7 ng/L (ref <18)
Troponin I (High Sensitivity): 6 ng/L (ref <18)

## 2022-10-16 LAB — LIPASE, BLOOD: Lipase: 31 U/L (ref 11–51)

## 2022-10-16 MED ORDER — SODIUM CHLORIDE 0.9 % IV BOLUS: 1000.0000 mL | Freq: Once | INTRAVENOUS | Status: DC

## 2022-10-16 NOTE — ED Notes (Signed)
RN unable to obtain IV access via [place], MD to look via [place]

## 2022-10-16 NOTE — ED Notes (Signed)
Pt states her granddaughter brought her here and that her daughter, whom she lives with, is out of town, unable to find contact information for pts granddaughter

## 2022-10-16 NOTE — ED Notes (Signed)
Patient transported to CT 

## 2022-10-16 NOTE — ED Notes (Signed)
MD attempted IV [place], IV was obtained but site blew after second flush. MD aware of no IV at this time.

## 2022-10-16 NOTE — ED Notes (Signed)
Unable to obtain IV access, two RN attempted, will ask RN certified in [place] IV to attempt access

## 2022-10-16 NOTE — ED Notes (Signed)
MD at bedside. 

## 2022-10-16 NOTE — ED Triage Notes (Signed)
Pt with c/o abd pain with nausea for several days. + diarrhea. Pt states she has abscesses to bottom for several days as well.

## 2022-10-16 NOTE — Discharge Instructions (Signed)
You were seen in the emerged part for abdominal pain The CAT scan of your abdomen did not show any concerning findings Your blood work looked okay It does not appear that you have a urinary tract infection It is important that you follow-up with your primary care doctor in 1 week for reevaluation Return to the emergency room for severe pain if you are unable to eat or drink or if you have any other concerns

## 2022-10-16 NOTE — ED Provider Notes (Signed)
[place] [facility] AT [facility] Provider Note   CSN: [id number] Arrival date & time: 10/16/22  1325     History  Chief Complaint  Patient presents with   Abdominal Pain    [name] is a 77 y.o. female.  With a past medical history of status post gastric bypass, cholecystectomy hysterectomy and A-fib not on anticoagulation (E for abdominal pain.  She reports 3 days of ongoing abdominal pain localized over the lower abdomen with associated nausea and diarrhea.  She is uncertain if there was any blood in her diarrhea.  She also notes an abscess adjacent to her rectum which "popped" earlier today.  No chest pain shortness of breath fevers or chills.  Patient lives at home with her daughter.  History limited secondary to poor historian   Abdominal Pain      Home Medications Prior to Admission medications   Medication Sig Start Date End Date Taking? Authorizing Provider  atenolol (TENORMIN) 50 MG tablet TAKE 1 TABLET(50 MG) BY MOUTH DAILY 04/09/22   Jonelle Sidle, MD  cyanocobalamin (VITAMIN B12) 1000 MCG/ML injection Inject 1,000 mcg into the muscle every 30 (thirty) days.    [provider]  diltiazem (CARDIZEM CD) 240 MG 24 hr capsule Take 1 capsule (240 mg total) by mouth daily. 09/09/22   Del Nigel Berthold, FNP  donepezil (ARICEPT) 5 MG tablet Take 1 tablet (5 mg total) by mouth at bedtime. 09/09/22   Del Nigel Berthold, FNP  levothyroxine (SYNTHROID, LEVOTHROID) 75 MCG tablet Take 75 mcg by mouth daily before breakfast.    [provider]  lidocaine (LIDODERM) 5 % Place 1 patch onto the skin daily. Remove & Discard patch within 12 hours or as directed by MD 09/09/22   Del Nigel Berthold, FNP  ondansetron (ZOFRAN) 4 MG tablet Take 1 tablet (4 mg total) by mouth every 8 (eight) hours as needed for nausea or vomiting. 10/01/22   Letta Median, PA-C  oxybutynin (DITROPAN XL) 15 MG 24 hr tablet Take 1 tablet (15 mg  total) by mouth at bedtime. 09/09/22   Del Nigel Berthold, FNP  pantoprazole (PROTONIX) 40 MG tablet Take 1 tablet (40 mg total) by mouth 2 (two) times daily. 09/09/22   Del Nigel Berthold, FNP  venlafaxine (EFFEXOR) 75 MG tablet Take 1 tablet (75 mg total) by mouth 2 (two) times daily. 10/15/22   Del Nigel Berthold, FNP  Vitamin D, Ergocalciferol, (DRISDOL) 1.25 MG (50000 UNIT) CAPS capsule Take 1 capsule (50,000 Units total) by mouth every Monday. 09/13/22   Del Nigel Berthold, FNP      Allergies    Benzodiazepines, Codeine, Escitalopram oxalate, Nsaids, Sertraline hcl, Statins, and Tramadol hcl    Review of Systems   Review of Systems  Gastrointestinal:  Positive for abdominal pain.    Physical Exam Updated Vital Signs BP (!) 167/114   Pulse 89   Temp 98.3 F (36.8 C) (Oral)   Resp 17   Ht 5\' 5"  (1.651 m)   Wt 80.3 kg   SpO2 98%   BMI 29.46 kg/m  Physical Exam Vitals and nursing note reviewed.  HENT:     Head: Normocephalic and atraumatic.  Eyes:     Pupils: Pupils are equal, round, and reactive to light.  Cardiovascular:     Rate and Rhythm: Normal rate and regular rhythm.  Pulmonary:     Effort: Pulmonary effort is normal.  Breath sounds: Normal breath sounds.  Abdominal:     Palpations: Abdomen is soft.     Tenderness: There is abdominal tenderness in the right lower quadrant, suprapubic area and left lower quadrant.     Comments: Circular 2 cm wound over 12:00 perirectal position without erythema drainage or fluctuance  Skin:    General: Skin is warm and dry.  Neurological:     Mental Status: She is alert.  Psychiatric:        Mood and Affect: Mood normal.     ED Results / Procedures / Treatments   Labs (all labs ordered are listed, but only abnormal results are displayed) Labs Reviewed  COMPREHENSIVE METABOLIC PANEL - Abnormal; Notable for the following components:      Result Value   Glucose, Bld 135 (*)    BUN 25 (*)     Creatinine, Ser 1.43 (*)    Total Protein 6.4 (*)    AST 14 (*)    GFR, Estimated 38 (*)    All other components within normal limits  CBC - Abnormal; Notable for the following components:   MCV 101.3 (*)    All other components within normal limits  URINALYSIS, ROUTINE W REFLEX MICROSCOPIC - Abnormal; Notable for the following components:   Color, Urine STRAW (*)    All other components within normal limits  LIPASE, BLOOD  TROPONIN I (HIGH SENSITIVITY)  TROPONIN I (HIGH SENSITIVITY)    EKG None  Radiology CT ABDOMEN PELVIS WO CONTRAST  Result Date: 10/16/2022 CLINICAL DATA:  Left lower quadrant abdominal pain. History of gastric bypass. EXAM: CT ABDOMEN AND PELVIS WITHOUT CONTRAST TECHNIQUE: Multidetector CT imaging of the abdomen and pelvis was performed following the standard protocol without IV contrast. RADIATION DOSE REDUCTION: This exam was performed according to the departmental dose-optimization program which includes automated exposure control, adjustment of the mA and/or kV according to patient size and/or use of iterative reconstruction technique. COMPARISON:  CT abdomen and pelvis dated 07/12/2021. FINDINGS: Lower chest: The heart is enlarged.  Mild bibasilar atelectasis. Hepatobiliary: No focal liver abnormality is seen. Status post cholecystectomy. No biliary dilatation. Pancreas: Unremarkable. No pancreatic ductal dilatation or surrounding inflammatory changes. Spleen: Normal in size without focal abnormality. Adrenals/Urinary Tract: Adrenal glands are unremarkable. Nonobstructive left renal calculi measure up to 3 mm in size. No renal calculi on the right. No hydronephrosis or suspicious focal lesion on either side. Bladder is unremarkable. Stomach/Bowel: The patient is status post a gastric bypass. Appendix is not visualized and likely absent. No evidence of bowel wall thickening, distention, or inflammatory changes. Vascular/Lymphatic: Aortic atherosclerosis. No enlarged  abdominal or pelvic lymph nodes. Reproductive: Status post hysterectomy. No adnexal masses. Other: No abdominal wall hernia or abnormality. No abdominopelvic ascites. Musculoskeletal: Fixation hardware is seen at L4-L5. Degenerative changes are seen in the spine. IMPRESSION: 1. No acute findings in the abdomen or pelvis. Aortic Atherosclerosis (ICD10-I70.0). Electronically Signed   By: Romona Curls M.D.   On: 10/16/2022 17:08    Procedures Ultrasound ED Peripheral IV (Provider)  Date/Time: 10/16/2022 4:17 PM  Performed by: Royanne Foots, DO Authorized by: Royanne Foots, DO   Procedure details:    Indications: multiple failed IV attempts     Skin Prep: isopropyl alcohol     Location:  Left AC   Angiocath:  20 G   Bedside Ultrasound Guided: Yes     Images: archived     Patient tolerated procedure without complications: Yes     Dressing  applied: Yes       Medications Ordered in ED Medications  sodium chloride 0.9 % bolus 1,000 mL (0 mLs Intravenous Hold 10/16/22 1631)    ED Course/ Medical Decision Making/ A&P Clinical Course as of 10/16/22 2002  Sat Oct 16, 2022  1953 Initial troponin of 6 with a troponin of 7.  CBC unremarkable.  No evidence of UTI on UA.  CMP unremarkable overall.  No acute findings CT abdomen pelvis.  Patient reports feeling better although she is still having abdominal discomfort.  She is able to eat and drink without issue.  She feels comfortable going home under the care of her daughter.  Return precautions are discussed in detail [MP]    Clinical Course User Index [MP] Royanne Foots, DO                                 Medical Decision Making 77 year old female with history as above presenting for 3 days of abdominal pain along with diarrhea.  Vital signs notable for tachycardia.  Patient afebrile and slightly hypertensive.  Exam notable for lower abdominal tenderness without rebound rigidity or guarding.  Perhaps perianal abscess but does not appear  to require any drainage at this time.  Will evaluate for intra-abdominal infection.  Will also evaluate for ACS given potential for atypical ACS notably female.  Amount and/or Complexity of Data Reviewed Labs: ordered. Radiology: ordered.           Final Clinical Impression(s) / ED Diagnoses Final diagnoses:  Generalized abdominal pain    Rx / DC Orders ED Discharge Orders     None         Royanne Foots, DO 10/16/22 2002

## 2022-10-16 NOTE — ED Notes (Signed)
Pt is A&O to name, when asked about month, pt states "its almost October". Overall, appears generally confused, pt states she does live with her daughter

## 2022-10-16 NOTE — ED Notes (Signed)
MD, and this nurse assessed pts bottom for abscess. Patient stated "it popped a few days ago." Reddened area in crevice no infection noted by MD appears to have already drained itself.

## 2022-10-16 NOTE — ED Notes (Signed)
RN at bedside attempting to obtain access via IV [place]

## 2022-10-19 ENCOUNTER — Telehealth: Payer: Self-pay | Admitting: Cardiology

## 2022-10-19 NOTE — Telephone Encounter (Signed)
Daughter calling in asking that we look at patient chart from ER visit. Wanting to see if changes need to be made for patient because of bp. Please advise

## 2022-10-20 ENCOUNTER — Encounter: Payer: Self-pay | Admitting: *Deleted

## 2022-10-20 NOTE — Telephone Encounter (Signed)
[name] is returning phone call.  °

## 2022-10-20 NOTE — Telephone Encounter (Signed)
Left message to return call 

## 2022-10-22 NOTE — Telephone Encounter (Signed)
Left message for the patient to call back.

## 2022-11-07 ENCOUNTER — Other Ambulatory Visit: Payer: Self-pay | Admitting: Family Medicine

## 2022-11-18 ENCOUNTER — Ambulatory Visit: Payer: PPO

## 2022-11-26 ENCOUNTER — Other Ambulatory Visit: Payer: Self-pay | Admitting: Family Medicine

## 2022-11-26 ENCOUNTER — Other Ambulatory Visit: Payer: Self-pay

## 2022-11-26 ENCOUNTER — Encounter (HOSPITAL_COMMUNITY)
Admission: RE | Admit: 2022-11-26 | Discharge: 2022-11-26 | Disposition: A | Discharge status: Home / Self Care | Payer: PPO | Source: Ambulatory Visit | Attending: Internal Medicine | Admitting: Internal Medicine

## 2022-11-26 DIAGNOSIS — K219 Gastro-esophageal reflux disease without esophagitis: Secondary | ICD-10-CM

## 2022-11-30 NOTE — Anesthesia Preprocedure Evaluation (Signed)
Anesthesia Evaluation  Patient identified by MRN, date of birth, ID band Patient awake    Reviewed: Allergy & Precautions, NPO status , Patient's Chart, lab work & pertinent test results  History of Anesthesia Complications (+) history of anesthetic complications  Airway Mallampati: III  TM Distance: >3 FB Neck ROM: Full    Dental  (+) Dental Advisory Given, Poor Dentition, Missing, Chipped   Pulmonary shortness of breath, sleep apnea , Recent URI , Resolved, former smoker States unable to tolerate CPAP Denies O2 use smoking, or breathing meds    Pulmonary exam normal breath sounds clear to auscultation       Cardiovascular Exercise Tolerance: Poor hypertension, Pt. on medications Normal cardiovascular exam+ dysrhythmias Atrial Fibrillation II Rhythm:Regular Rate:Normal  1. Left ventricular ejection fraction, by estimation, is 55 to 60%. The  left ventricle has normal function. The left ventricle has no regional  wall motion abnormalities. There is mild concentric left ventricular  hypertrophy. Left ventricular diastolic  parameters are indeterminate.  2. Right ventricular systolic function is normal. The right ventricular  size is normal. There is normal pulmonary artery systolic pressure.  3. Left atrial size was severely dilated.  4. Right atrial size was mildly dilated.  5. The mitral valve is degenerative. Mild mitral valve regurgitation.  6. The aortic valve is tricuspid. Aortic valve regurgitation is not  visualized. No aortic stenosis is present.  7. The inferior vena cava is dilated in size with >50% respiratory  variability, suggesting right atrial pressure of 8 mmHg.  Persistent AF   Neuro/Psych  Headaches PSYCHIATRIC DISORDERS Anxiety Depression     Neuromuscular disease negative neurological ROS  negative psych ROS   GI/Hepatic negative GI ROS, Neg liver ROS, hiatal hernia, PUD,GERD  Medicated and  Controlled,,Abdominal pain and nausea   Endo/Other  negative endocrine ROSHypothyroidism    Renal/GU Renal InsufficiencyRenal diseasenegative Renal ROS  negative genitourinary   Musculoskeletal negative musculoskeletal ROS (+) Arthritis , Osteoarthritis,    Abdominal Normal abdominal exam  (+)   Peds negative pediatric ROS (+)  Hematology negative hematology ROS (+) Blood dyscrasia, anemia   Anesthesia Other Findings   Reproductive/Obstetrics negative OB ROS                             Anesthesia Physical Anesthesia Plan  ASA: 3  Anesthesia Plan: General   Post-op Pain Management: Minimal or no pain anticipated   Induction: Intravenous  PONV Risk Score and Plan: Propofol infusion  Airway Management Planned: Nasal Cannula and Natural Airway  Additional Equipment: None  Intra-op Plan:   Post-operative Plan:   Informed Consent: I have reviewed the patients History and Physical, chart, labs and discussed the procedure including the risks, benefits and alternatives for the proposed anesthesia with the patient or authorized representative who has indicated his/her understanding and acceptance.     Dental advisory given  Plan Discussed with: CRNA  Anesthesia Plan Comments:         Anesthesia Quick Evaluation

## 2022-12-01 ENCOUNTER — Ambulatory Visit (HOSPITAL_COMMUNITY)
Admission: RE | Admit: 2022-12-01 | Discharge: 2022-12-01 | Disposition: A | Discharge status: Home / Self Care | Payer: PPO | Attending: Internal Medicine | Admitting: Internal Medicine

## 2022-12-01 ENCOUNTER — Ambulatory Visit (HOSPITAL_BASED_OUTPATIENT_CLINIC_OR_DEPARTMENT_OTHER): Payer: PPO | Admitting: Anesthesiology

## 2022-12-01 ENCOUNTER — Encounter (HOSPITAL_COMMUNITY): Payer: Self-pay | Admitting: Internal Medicine

## 2022-12-01 ENCOUNTER — Other Ambulatory Visit: Payer: Self-pay

## 2022-12-01 ENCOUNTER — Encounter (HOSPITAL_COMMUNITY)
Admission: RE | Disposition: A | Discharge status: Home / Self Care | Payer: Self-pay | Source: Home / Self Care | Attending: Internal Medicine

## 2022-12-01 ENCOUNTER — Ambulatory Visit (HOSPITAL_COMMUNITY): Payer: Self-pay | Admitting: Anesthesiology

## 2022-12-01 DIAGNOSIS — R933 Abnormal findings on diagnostic imaging of other parts of digestive tract: Secondary | ICD-10-CM | POA: Diagnosis not present

## 2022-12-01 DIAGNOSIS — R1310 Dysphagia, unspecified: Secondary | ICD-10-CM | POA: Insufficient documentation

## 2022-12-01 DIAGNOSIS — K449 Diaphragmatic hernia without obstruction or gangrene: Secondary | ICD-10-CM | POA: Insufficient documentation

## 2022-12-01 DIAGNOSIS — R110 Nausea: Secondary | ICD-10-CM | POA: Insufficient documentation

## 2022-12-01 DIAGNOSIS — F32A Depression, unspecified: Secondary | ICD-10-CM | POA: Diagnosis not present

## 2022-12-01 DIAGNOSIS — F419 Anxiety disorder, unspecified: Secondary | ICD-10-CM | POA: Insufficient documentation

## 2022-12-01 DIAGNOSIS — Z87891 Personal history of nicotine dependence: Secondary | ICD-10-CM | POA: Insufficient documentation

## 2022-12-01 DIAGNOSIS — E039 Hypothyroidism, unspecified: Secondary | ICD-10-CM | POA: Insufficient documentation

## 2022-12-01 DIAGNOSIS — I4819 Other persistent atrial fibrillation: Secondary | ICD-10-CM | POA: Diagnosis not present

## 2022-12-01 DIAGNOSIS — G4730 Sleep apnea, unspecified: Secondary | ICD-10-CM | POA: Insufficient documentation

## 2022-12-01 DIAGNOSIS — Z8711 Personal history of peptic ulcer disease: Secondary | ICD-10-CM | POA: Insufficient documentation

## 2022-12-01 DIAGNOSIS — K219 Gastro-esophageal reflux disease without esophagitis: Secondary | ICD-10-CM | POA: Insufficient documentation

## 2022-12-01 DIAGNOSIS — Z9884 Bariatric surgery status: Secondary | ICD-10-CM | POA: Insufficient documentation

## 2022-12-01 DIAGNOSIS — K3189 Other diseases of stomach and duodenum: Secondary | ICD-10-CM | POA: Diagnosis not present

## 2022-12-01 DIAGNOSIS — Z980 Intestinal bypass and anastomosis status: Secondary | ICD-10-CM

## 2022-12-01 HISTORY — PX: MALONEY DILATION: SHX5535

## 2022-12-01 HISTORY — PX: ESOPHAGOGASTRODUODENOSCOPY (EGD) WITH PROPOFOL: SHX5813

## 2022-12-01 SURGERY — ESOPHAGOGASTRODUODENOSCOPY (EGD) WITH PROPOFOL
Anesthesia: General

## 2022-12-01 MED ORDER — LIDOCAINE HCL (PF) 2 % IJ SOLN
INTRAMUSCULAR | Status: DC | PRN
  Administered 2022-12-01: 50 mg via INTRADERMAL

## 2022-12-01 MED ORDER — PROPOFOL 10 MG/ML IV BOLUS
INTRAVENOUS | Status: DC | PRN
  Administered 2022-12-01: 80 mg via INTRAVENOUS
  Administered 2022-12-01: 150 ug/kg/min via INTRAVENOUS

## 2022-12-01 MED ORDER — PROPOFOL 500 MG/50ML IV EMUL
INTRAVENOUS | Status: AC
  Filled 2022-12-01: qty 50

## 2022-12-01 MED ORDER — LACTATED RINGERS IV SOLN: INTRAVENOUS | Status: DC | PRN

## 2022-12-01 NOTE — Transfer of Care (Signed)
Immediate Anesthesia Transfer of Care Note  Patient: [name]  Procedure(s) Performed: ESOPHAGOGASTRODUODENOSCOPY (EGD) WITH PROPOFOL MALONEY DILATION  Patient Location: Short Stay  Anesthesia Type:General  Level of Consciousness: awake, alert , and confused  Airway & Oxygen Therapy: Patient Spontanous Breathing  Post-op Assessment: Report given to RN, Post -op Vital signs reviewed and stable, Patient moving all extremities X 4, and Patient able to stick tongue midline  Post vital signs: Reviewed and stable  Last Vitals:  Vitals Value Taken Time  BP 112/64   Temp 98.6   Pulse 76   Resp 23   SpO2 99     Last Pain:  Vitals:   12/01/22 0716  TempSrc: Oral  PainSc: 0-No pain      Patients Stated Pain Goal: 4 (12/01/22 0716)  Complications: No notable events documented.

## 2022-12-01 NOTE — H&P (Signed)
@LOGO @   Primary Care Physician:  Rica Records, FNP Primary Gastroenterologist:  Dr. Jena Gauss  Pre-Procedure History & Physical: HPI:  [name] is a 77 y.o. female here for further evaluation of esophageal dysphagia history gastric bypass procedure nausea.  CT suggest the possibility of a jejunal ulcer.  She is here for diagnostic EGD.  Possible esophageal dilation.  Also history of chronic constipation.  Refractory GERD.  Past Medical History:  Diagnosis Date   Anemia    Anxiety    Arthritis    Chronic back pain    Spondylosis and stenosis   Depression    GERD (gastroesophageal reflux disease)    Headache(784.0)    Hiatal hernia    History of blood transfusion    History of GI bleed    Hypothyroidism    Mitral regurgitation    Obsessive-compulsive disorder    Persistent atrial fibrillation (HCC)    Presence of Watchman left atrial appendage closure device 12/03/2021   31mm Watchman FLX with Dr. Excell Seltzer   Recurrent upper respiratory infection (URI)    Sleep apnea     Past Surgical History:  Procedure Laterality Date   ABDOMINAL HYSTERECTOMY     ANTERIOR CERVICAL DECOMP/DISCECTOMY FUSION  12/09/2010   Procedure: ANTERIOR CERVICAL DECOMPRESSION/DISCECTOMY FUSION 3 LEVELS;  Surgeon: Cristi Loron;  Location: [facility];  Service: Neurosurgery;  Laterality: N/A;  Cervical three-four,Cervical four-five,Cervical Five-Six,Cervical Six-Seven ANTERIOR CERVICAL DECOMPRESSION WITH FUSION INTERBODY PROTHESIS PLATING AND BONEGRAFT   APPENDECTOMY     BIOPSY N/A 11/14/2014   Procedure: GASTRIC BIOPSY;  Surgeon: Corbin Ade, MD;  Location: [facility];  Service: Endoscopy;  Laterality: N/A;   CESAREAN SECTION     CHOLECYSTECTOMY     COLONOSCOPY WITH PROPOFOL N/A 11/14/2014   RMR: redundant colon but otherwise normal   COLONOSCOPY WITH PROPOFOL N/A 05/12/2020   Dr. Jena Gauss;  Seven 4 to 7 mm polyps, diverticulosis in the sigmoid colon. Pathology with tubular adenomas.  Recommended repeat in 3 years.   ESOPHAGOGASTRODUODENOSCOPY (EGD) WITH PROPOFOL N/A 11/14/2014   RMR"s/p gastric surgery/anastomtic ulcer   ESOPHAGOGASTRODUODENOSCOPY (EGD) WITH PROPOFOL N/A 09/15/2017   Procedure: ESOPHAGOGASTRODUODENOSCOPY (EGD) WITH PROPOFOL;  Surgeon: Corbin Ade, MD;  Location: [facility];  Service: Endoscopy;  Laterality: N/A;  11:30am   ESOPHAGOGASTRODUODENOSCOPY (EGD) WITH PROPOFOL N/A 04/04/2019   2 cm ulcer crater at anastomosis with smaller satellite areas of ulceration in setting of NSAIDs/Goody powders, no bleeding stigmata.   ESOPHAGOGASTRODUODENOSCOPY (EGD) WITH PROPOFOL N/A 06/13/2019   Jerrel Tiberio: Prior gastric bypass surgery with Billroth II type anatomy, otherwise normal-appearing residual upper GI tract.   EYE SURGERY     bilateral cataracts removed, w/IOL   GASTRIC BYPASS  2008   JOINT REPLACEMENT     07/2010 &  2002- respectively- both knees    LEFT ATRIAL APPENDAGE OCCLUSION N/A 12/03/2021   Procedure: LEFT ATRIAL APPENDAGE OCCLUSION;  Surgeon: Tonny Bollman, MD;  Location: [facility] INVASIVE CV LAB;  Service: Cardiovascular;  Laterality: N/A;   MALONEY DILATION N/A 09/15/2017   Procedure: Elease Hashimoto DILATION;  Surgeon: Corbin Ade, MD;  Location: [facility];  Service: Endoscopy;  Laterality: N/A;   PARATHYROIDECTOMY     PERIPHERALLY INSERTED CENTRAL CATHETER INSERTION     PICC line for treatment of MRSA   POLYPECTOMY  05/12/2020   Procedure: POLYPECTOMY;  Surgeon: Corbin Ade, MD;  Location: [facility];  Service: Endoscopy;;   TEE WITHOUT CARDIOVERSION N/A 12/03/2021   Procedure: TRANSESOPHAGEAL ECHOCARDIOGRAM (  TEE);  Surgeon: Tonny Bollman, MD;  Location: [facility] INVAS[facility] CV LAB;  Service: Cardiovascular;  Laterality: N/A;   TONSILLECTOMY     as a child    Prior to Admission medications   Medication Sig Start Date End Date Taking? Authorizing Provider  atenolol (TENORMIN) 50 MG tablet TAKE 1 TABLET(50 MG) BY MOUTH DAILY 04/09/22  Yes  Jonelle Sidle, MD  cyanocobalamin (VITAMIN B12) 1000 MCG/ML injection Inject 1,000 mcg into the muscle every 30 (thirty) days.   Yes [provider]  diltiazem (CARDIZEM CD) 240 MG 24 hr capsule Take 1 capsule (240 mg total) by mouth daily. 09/09/22  Yes Del Newman Nip, Tenna Child, FNP  donepezil (ARICEPT) 5 MG tablet TAKE 1 TABLET BY MOUTH EVERYDAY AT BEDTIME 11/26/22  Yes Del Newman Nip, [place], FNP  levothyroxine (SYNTHROID, LEVOTHROID) 75 MCG tablet Take 75 mcg by mouth daily before breakfast.   Yes [provider]  lidocaine (LIDODERM) 5 % Place 1 patch onto the skin daily. Remove & Discard patch within 12 hours or as directed by MD 09/09/22  Yes Del Newman Nip, Tenna Child, FNP  ondansetron (ZOFRAN) 4 MG tablet Take 1 tablet (4 mg total) by mouth every 8 (eight) hours as needed for nausea or vomiting. 10/01/22  Yes Letta Median, PA-C  oxybutynin (DITROPAN XL) 15 MG 24 hr tablet TAKE 1 TABLET BY MOUTH EVERYDAY AT BEDTIME 11/26/22  Yes Del Newman Nip, [place], FNP  pantoprazole (PROTONIX) 40 MG tablet TAKE 1 TABLET BY MOUTH TWICE A DAY 11/26/22  Yes Del Newman Nip, Tenna Child, FNP  venlafaxine (EFFEXOR) 75 MG tablet TAKE 1 TABLET BY MOUTH TWICE A DAY 11/08/22  Yes Del Newman Nip, [place], FNP  Vitamin D, Ergocalciferol, (DRISDOL) 1.25 MG (50000 UNIT) CAPS capsule TAKE 1 CAPSULE (50,000 UNITS TOTAL) BY MOUTH EVERY MONDAY. 11/29/22  Yes Del Nigel Berthold, FNP    Allergies as of 10/20/2022 - Review Complete 10/16/2022  Allergen Reaction Noted   Benzodiazepines Other (See Comments) 12/03/2011   Codeine Nausea Only    Escitalopram oxalate Nausea Only 12/02/2010   Nsaids Other (See Comments) 12/22/2012   Sertraline hcl Nausea Only    Statins Nausea And Vomiting    Tramadol hcl Other (See Comments) 12/22/2012    Family History  Problem Relation Age of Onset   Anxiety disorder Mother    Breast cancer Mother    Heart attack Father    Anxiety disorder Sister     Breast cancer Sister    Bipolar disorder Daughter    OCD Daughter    Anesthesia problems Neg Hx    Hypotension Neg Hx    Malignant hyperthermia Neg Hx    Pseudochol deficiency Neg Hx    Dementia Neg Hx    Alcohol abuse Neg Hx    Drug abuse Neg Hx    Depression Neg Hx    Paranoid behavior Neg Hx    Schizophrenia Neg Hx    Seizures Neg Hx    Sexual abuse Neg Hx    Physical abuse Neg Hx    Colon cancer Neg Hx     Social History   Socioeconomic History   Marital status: Widowed    Spouse name: Not on file   Number of children: 4   Years of education: Not on file   Highest education level: Not on file  Occupational History   Not on file  Tobacco Use   Smoking status: Former    Current packs/day: 0.00    Average  packs/day: 1 pack/day for 30.0 years (30.0 ttl pk-yrs)    Types: Cigarettes    Start date: 01/19/1968    Quit date: 01/18/1998    Years since quitting: 24.8   Smokeless tobacco: Never  Vaping Use   Vaping status: Never Used  Substance and Sexual Activity   Alcohol use: No   Drug use: No   Sexual activity: Not on file    Comment: Hysterectomy  Other Topics Concern   Not on file  Social History Narrative   Not on file   Social Determinants of Health   Financial Resource Strain: Not on file  Food Insecurity: Not on file  Transportation Needs: Not on file  Physical Activity: Not on file  Stress: Not on file  Social Connections: Not on file  Intimate Partner Violence: Not on file    Review of Systems: See HPI, otherwise negative ROS  Physical Exam: BP (!) 157/108   Pulse 84   Temp 97.7 F (36.5 C) (Oral)   Resp 19   Ht 5\' 5"  (1.651 m)   Wt 77.1 kg   SpO2 100%   BMI 28.29 kg/m  General:   Alert,  Well-developed, well-nourished, pleasant and cooperative in NAD lungs:  Clear throughout to auscultation.   No wheezes, crackles, or rhonchi. No acute distress. Heart:  Regular rate and rhythm; no murmurs, clicks, rubs,  or gallops. Abdomen:  Non-distended, normal bowel sounds.  Soft and nontender without appreciable mass or hepatosplenomegaly.  Impression/Plan: 77 year old status post gastric bypass esophageal dysphagia difficult to control GERD.  Recurrent nausea possible ulcer at anastomosis on CT.  EGD today with possible esophageal dilation is feasible/appropriate per plan..  The risks, benefits, limitations, alternatives and imponderables have been reviewed with the patient. Potential for esophageal dilation, biopsy, etc. have also been reviewed.  Questions have been answered. All parties agreeable.      Notice: This dictation was prepared with Dragon dictation along with smaller phrase technology. Any transcriptional errors that result from this process are unintentional and may not be corrected upon review.

## 2022-12-01 NOTE — Discharge Instructions (Signed)
EGD Discharge instructions Please read the instructions outlined below and refer to this sheet in the next few weeks. These discharge instructions provide you with general information on caring for yourself after you leave the hospital. Your doctor may also give you specific instructions. While your treatment has been planned according to the most current medical practices available, unavoidable complications occasionally occur. If you have any problems or questions after discharge, please call your doctor. ACTIVITY You may resume your regular activity but move at a slower pace for the next 24 hours.  Take frequent rest periods for the next 24 hours.  Walking will help expel (get rid of) the air and reduce the bloated feeling in your abdomen.  No driving for 24 hours (because of the anesthesia (medicine) used during the test).  You may shower.  Do not sign any important legal documents or operate any machinery for 24 hours (because of the anesthesia used during the test).  NUTRITION Drink plenty of fluids.  You may resume your normal diet.  Begin with a light meal and progress to your normal diet.  Avoid alcoholic beverages for 24 hours or as instructed by your caregiver.  MEDICATIONS You may resume your normal medications unless your caregiver tells you otherwise.  WHAT YOU CAN EXPECT TODAY You may experience abdominal discomfort such as a feeling of fullness or "gas" pains.  FOLLOW-UP Your doctor will discuss the results of your test with you.  SEEK IMMEDIATE MEDICAL ATTENTION IF ANY OF THE FOLLOWING OCCUR: Excessive nausea (feeling sick to your stomach) and/or vomiting.  Severe abdominal pain and distention (swelling).  Trouble swallowing.  Temperature over 101 F (37.8 C).  Rectal bleeding or vomiting of blood.    Your esophagus was stretched today  Your residual stomach appeared somewhat inflamed.  Biopsies taken.  No change in your medical regimen for now.  However, I do  recommend you take MiraLAX twice daily to improve bowel function  Office visit with Ermalinda Memos in our office in 4 weeks  At patient request, I called Derrill Memo [phone] findings and recommendations

## 2022-12-01 NOTE — Op Note (Signed)
[facility] Patient Name: [name] Procedure Date: 12/01/2022 7:50 AM MRN: [id number] Date of Birth: Aug 28, 1945 Attending MD: Gennette Pac , MD, [id number] CSN: [id number] Age: 77 Admit Type: Outpatient Procedure:                Upper GI endoscopy Indications:              Dysphagia, Abnormal CT of the GI tract Providers:                Gennette Pac, MD, Nena Polio, RN, Zena Amos Referring MD:              Medicines:                Propofol per Anesthesia Complications:            No immediate complications. Estimated Blood Loss:     Estimated blood loss was minimal. Procedure:                Pre-Anesthesia Assessment:                           - Prior to the procedure, a History and Physical                            was performed, and patient medications and                            allergies were reviewed. The patient's tolerance of                            previous anesthesia was also reviewed. The risks                            and benefits of the procedure and the sedation                            options and risks were discussed with the patient.                            All questions were answered, and informed consent                            was obtained. Prior Anticoagulants: The patient has                            taken no anticoagulant or antiplatelet agents. ASA                            Grade Assessment: III - A patient with severe                            systemic disease. After reviewing the risks and  benefits, the patient was deemed in satisfactory                            condition to undergo the procedure.                           After obtaining informed consent, the endoscope was                            passed under direct vision. Throughout the                            procedure, the patient's blood pressure, pulse, and                             oxygen saturations were monitored continuously. The                            GIF-H190 ([id number]) scope was introduced through the                            mouth, and advanced to the afferent and efferent                            jejunal loops. The upper GI endoscopy was                            accomplished without difficulty. The patient                            tolerated the procedure well. Scope In: 8:23:45 AM Scope Out: 8:31:51 AM Total Procedure Duration: 0 hours 8 minutes 6 seconds  Findings:      The examined esophagus was normal. Gastric pouch empty. Residual gastric       mucosa abnormal in that there was prominent snake skinning or fish scale       appearance of the mucosa. She had a small hiatal hernia.. Gastric       anastomosis with small bowel readily identified. Patent efferent limb       and small afferent limb stump. I did not identify gastric ulcer or       infiltrating process anywhere. The scope was withdrawn. Dilation was       performed with a Maloney dilator with mild resistance at 54 Fr. The       dilation site was examined following endoscope reinsertion and showed no       change. Estimated blood loss: none. Finally the residual gastric mucosa       was biopsied for histologic study. Impression:               - Normal esophagus. Esophagus dilated. Small hiatal                            hernia. Abnormal residual gastric mucosa. Query                            portal  hypertensive gastropathy rule out H. pylori.                           -Residual gastric mucosa biopsied. At the patient's                            dementia appears to be somewhat worse. Discussed                            with son. He tells me she has a significant hearing                            defect and they are working on getting hearing                            aids. This is likely a contributing factor. Moderate Sedation:      Moderate (conscious) sedation was personally  administered by an       anesthesia professional. The following parameters were monitored: oxygen       saturation, heart rate, blood pressure, respiratory rate, EKG, adequacy       of pulmonary ventilation, and response to care. Recommendation:           - Patient has a contact number available for                            emergencies. The signs and symptoms of potential                            delayed complications were discussed with the                            patient. Return to normal activities tomorrow.                            Written discharge instructions were provided to the                            patient.                           - Advance diet as tolerated.                           - Continue present medications. Increase MiraLAX to                            70 g orally twice daily as once daily apparently                            has not helping constipation.                           - Return to my office in 1 month. Follow-up on  pathology. Procedure Code(s):        --- Professional ---                           [id number], Esophagogastroduodenoscopy, flexible,                            transoral; diagnostic, including collection of                            specimen(s) by brushing or washing, when performed                            (separate procedure)                           43450, Dilation of esophagus, by unguided sound or                            bougie, single or multiple passes Diagnosis Code(s):        --- Professional ---                           R13.10, Dysphagia, unspecified                           R93.3, Abnormal findings on diagnostic imaging of                            other parts of digestive tract CPT copyright 2022 [employer]. All rights reserved. The codes documented in this report are preliminary and upon coder review may  be revised to meet current compliance requirements. Gerrit Friends. Laurey Salser, MD Gennette Pac, MD 12/01/2022 9:01:02 AM This report has been signed electronically. Number of Addenda: 0

## 2022-12-01 NOTE — Anesthesia Postprocedure Evaluation (Signed)
Anesthesia Post Note  Patient: [name]  Procedure(s) Performed: ESOPHAGOGASTRODUODENOSCOPY (EGD) WITH PROPOFOL MALONEY DILATION  Patient location during evaluation: PACU Anesthesia Type: General Level of consciousness: awake and alert Pain management: pain level controlled Vital Signs Assessment: post-procedure vital signs reviewed and stable Respiratory status: spontaneous breathing, nonlabored ventilation, respiratory function stable and patient connected to nasal cannula oxygen Cardiovascular status: blood pressure returned to baseline and stable Postop Assessment: no apparent nausea or vomiting Anesthetic complications: no   There were no known notable events for this encounter.   Last Vitals:  Vitals:   12/01/22 0716 12/01/22 0836  BP: (!) 157/108 112/64  Pulse: 84 80  Resp: 19 (!) 21  Temp: 36.5 C 36.7 C  SpO2: 100% 100%    Last Pain:  Vitals:   12/01/22 0836  TempSrc: Oral  PainSc: 0-No pain                 Joram Venson L Tristian Sickinger

## 2022-12-02 LAB — SURGICAL PATHOLOGY

## 2022-12-03 ENCOUNTER — Encounter: Payer: Self-pay | Admitting: Student

## 2022-12-03 ENCOUNTER — Ambulatory Visit: Payer: PPO | Attending: Student | Admitting: Student

## 2022-12-03 VITALS — BP 124/60 | HR 56 | Ht 65.0 in | Wt 167.2 lb

## 2022-12-03 DIAGNOSIS — I4819 Other persistent atrial fibrillation: Secondary | ICD-10-CM

## 2022-12-03 DIAGNOSIS — I10 Essential (primary) hypertension: Secondary | ICD-10-CM

## 2022-12-03 DIAGNOSIS — I340 Nonrheumatic mitral (valve) insufficiency: Secondary | ICD-10-CM

## 2022-12-03 DIAGNOSIS — Z95818 Presence of other cardiac implants and grafts: Secondary | ICD-10-CM

## 2022-12-03 DIAGNOSIS — I2510 Atherosclerotic heart disease of native coronary artery without angina pectoris: Secondary | ICD-10-CM

## 2022-12-03 MED ORDER — ATENOLOL 50 MG PO TABS: 50.0000 mg | ORAL_TABLET | Freq: Every day | ORAL | 3 refills | Status: DC

## 2022-12-03 NOTE — Progress Notes (Signed)
Cardiology Office Note    Date:  12/03/2022  ID:  [name], DOB [birth year], MRN [id number] Cardiologist: Nona Dell, MD    History of Present Illness:    [name] is a 77 y.o. female with past medical history of persistent atrial fibrillation (s/p Watchman device placement in 11/2021), mitral valve regurgitation, hypothyroidism, coronary calcification by CT, memory loss and anemia (in the setting of iron deficiency anemia, PUD and prior GI bleed) who presents to the office today for 62-month follow-up.   She was last examined by myself in 05/2022 and reported overall doing well at that time. Did report occasional palpitations but no persistent symptoms. Did have intermittent dizziness which had been occurring for over 5 years and was overall unchanged. Was continued on her current-rate controlling agents with Atenolol 50 mg daily and Cardizem CD 240 mg daily. She was no longer on anticoagulation since undergoing Watchman device placement and it had been confirmed with the Structural Heart Team that she could stop ASA and Plavix.  In talking with the patient and her daughter today, she reports having occasional palpitations but says they spontaneously resolve within a few seconds to minutes. No persistent symptoms. She denies any specific dyspnea on exertion, orthopnea, PND or pitting edema. Does report having occasional episodes of chest pain but is unable to recall when this occurs. Not sure if associated with exertion, positional changes or food consumption. She does have known acid reflux. No recent dizziness or presyncope.  Studies Reviewed:   EKG: EKG is not ordered today. EKG from 10/16/2022 is reviewed and shows atrial fibrillation, HR 108 with IVCD.   TEE: 11/2021 IMPRESSIONS     1. TEE guided LAA closure. 31 mm Watchman FLX implanted with 19%  compression. No device leak. Small iatrogenic ASD with L to R shunting. No  immediate complications.   2. Left ventricular  ejection fraction, by estimation, is 55 to 60%. The  left ventricle has normal function.   3. Right ventricular systolic function is normal. The right ventricular  size is normal.   4. Left atrial size was severely dilated. No left atrial/left atrial  appendage thrombus was detected.   5. Right atrial size was severely dilated.   6. The mitral valve is abnormal. Moderate to severe mitral valve  regurgitation.   7. The aortic valve is tricuspid. There is moderate calcification of the  aortic valve. There is moderate thickening of the aortic valve. Aortic  valve regurgitation is trivial. Aortic valve sclerosis/calcification is  present, without any evidence of  aortic stenosis.   8. TEE guided left atrial appendage closure.   Cardiac CT: 01/2022 IMPRESSION: 1. A 31 mm Watchman FLX is present with no evidence of device leak. There is contrast past the device, suggestive of delayed endothelialization.   2. Biatrial enlargement.   3. Dilated pulmonary artery suggestive of pulmonary hypertension.    Risk Assessment/Calculations:    CHA2DS2-VASc Score = 5   This indicates a 7.2% annual risk of stroke. The patient's score is based upon: CHF History: 0 HTN History: 1 Diabetes History: 0 Stroke History: 0 Vascular Disease History: 1 Age Score: 2 Gender Score: 1    Physical Exam:   VS:  BP 124/60   Pulse (!) 56   Ht 5\' 5"  (1.651 m)   Wt 167 lb 3.2 oz (75.8 kg)   SpO2 100%   BMI 27.82 kg/m    Wt Readings from Last 3 Encounters:  12/03/22 167 lb 3.2  oz (75.8 kg)  12/01/22 170 lb (77.1 kg)  11/26/22 185 lb (83.9 kg)     GEN: Well nourished, well developed female appearing in no acute distress NECK: No JVD; No carotid bruits CARDIAC: Irregular irregular, 2/6 systolic murmur along Apex.  RESPIRATORY:  Clear to auscultation without rales, wheezing or rhonchi  ABDOMEN: Appears non-distended. No obvious abdominal masses. EXTREMITIES: No clubbing or cyanosis. No pitting  edema. Varicose veins noted. Distal pedal pulses are 2+ bilaterally.   Assessment and Plan:   1. Persistent Atrial Fibrillation/History of Watchman Device Placement - She reports occasional palpitations but no persistent symptoms. Given her heart rate in the 50's on her current medical therapy, would not further titrate AV nodal blocking agents at this time. Continue current medical therapy with Atenolol 50 mg daily and Cardizem CD 240 mg daily. - She previously underwent Watchman device placement and is no longer on anticoagulation or antiplatelets.   2. Mitral Regurgitation - This was moderate to severe by TEE in 11/2021. Will plan for a follow-up transthoracic echocardiogram for reassessment.  3. HTN - Blood pressure is well-controlled at 124/60 during today's visit. Continue current medical therapy with Cardizem CD 240 mg daily and Atenolol 50 mg daily.  4. Coronary Calcification by CT - She does report occasional episodes of chest pain but is unable to elaborate on when these occur. Does have known GERD as well. Will plan to obtain an echocardiogram as discussed above to assess for any structural abnormalities.  Signed, Ellsworth Lennox, PA-C

## 2022-12-03 NOTE — Patient Instructions (Signed)
Medication Instructions:   Continue current medication regimen.   *If you need a refill on your cardiac medications before your next appointment, please call your pharmacy*   Testing/Procedures:  Echocardiogram - Ultrasound of your heart   Follow-Up: At [facility], you and your health needs are our priority.  As part of our continuing mission to provide you with exceptional heart care, we have created designated Provider Care Teams.  These Care Teams include your primary Cardiologist (physician) and Advanced Practice Providers (APPs -  Physician Assistants and Nurse Practitioners) who all work together to provide you with the care you need, when you need it.  We recommend signing up for the patient portal called "MyChart".  Sign up information is provided on this After Visit Summary.  MyChart is used to connect with patients for Virtual Visits (Telemedicine).  Patients are able to view lab/test results, encounter notes, upcoming appointments, etc.  Non-urgent messages can be sent to your provider as well.   To learn more about what you can do with MyChart, go to [email or web address].    Your next appointment:   6 month(s)  Provider:   You may see Nona Dell, MD or one of the following Advanced Practice Providers on your designated Care Team:   [place], PA-C  Jacolyn Reedy, [place]

## 2022-12-06 ENCOUNTER — Encounter: Payer: Self-pay | Admitting: Internal Medicine

## 2022-12-08 ENCOUNTER — Encounter (HOSPITAL_COMMUNITY): Payer: Self-pay | Admitting: Internal Medicine

## 2022-12-10 ENCOUNTER — Ambulatory Visit (HOSPITAL_COMMUNITY)
Admission: RE | Admit: 2022-12-10 | Discharge: 2022-12-10 | Disposition: A | Discharge status: Home / Self Care | Payer: PPO | Source: Ambulatory Visit | Attending: Student | Admitting: Student

## 2022-12-10 DIAGNOSIS — I340 Nonrheumatic mitral (valve) insufficiency: Secondary | ICD-10-CM | POA: Diagnosis present

## 2022-12-10 LAB — ECHOCARDIOGRAM COMPLETE
AR max vel: 1.85 cm2
AV Area VTI: 1.78 cm2
AV Area mean vel: 1.83 cm2
AV Mean grad: 7 mm[Hg]
AV Peak grad: 11.3 mm[Hg]
Ao pk vel: 1.68 m/s
Area-P 1/2: 2.83 cm2
S' Lateral: 3.4 cm

## 2022-12-15 ENCOUNTER — Telehealth: Payer: Self-pay | Admitting: Cardiology

## 2022-12-15 ENCOUNTER — Ambulatory Visit: Payer: PPO

## 2022-12-15 VITALS — Ht 65.0 in | Wt 167.0 lb

## 2022-12-15 DIAGNOSIS — Z0000 Encounter for general adult medical examination without abnormal findings: Secondary | ICD-10-CM

## 2022-12-15 NOTE — Progress Notes (Signed)
Subjective:   [name] is a 77 y.o. female who presents for an Initial Medicare Annual Wellness Visit.  Visit Complete: Virtual I connected with  [name] on 12/15/22 by a audio enabled telemedicine application and verified that I am speaking with the correct person using two identifiers.  Patient Location: Home  Provider Location: Home Office  I discussed the limitations of evaluation and management by telemedicine. The patient expressed understanding and agreed to proceed.  Vital Signs: Because this visit was a virtual/telehealth visit, some criteria may be missing or patient reported. Any vitals not documented were not able to be obtained and vitals that have been documented are patient reported.  Cardiac Risk Factors include: advanced age (>50men, >10 women);sedentary lifestyle;hypertension     Objective:    Today's Vitals   12/15/22 1451  Weight: 167 lb (75.8 kg)  Height: 5\' 5"  (1.651 m)   Body mass index is 27.79 kg/m.     12/15/2022    4:11 PM 12/01/2022    7:12 AM 11/26/2022    8:48 AM 10/16/2022    1:38 PM 06/19/2022    6:32 PM 12/03/2021    9:39 AM 07/12/2021    2:46 PM  Advanced Directives  Does Patient Have a Medical Advance Directive? No No No No Yes No No  Type of Advance Directive     Healthcare Power of Attorney    Would patient like information on creating a medical advance directive? Yes (MAU/Ambulatory/Procedural Areas - Information given) No - Patient declined No - Patient declined   No - Patient declined     Current Medications (verified) Outpatient Encounter Medications as of 12/15/2022  Medication Sig   atenolol (TENORMIN) 50 MG tablet Take 1 tablet (50 mg total) by mouth daily.   cyanocobalamin (VITAMIN B12) 1000 MCG/ML injection Inject 1,000 mcg into the muscle every 30 (thirty) days.   diltiazem (CARDIZEM CD) 240 MG 24 hr capsule Take 1 capsule (240 mg total) by mouth daily.   donepezil (ARICEPT) 5 MG tablet TAKE 1 TABLET BY MOUTH  EVERYDAY AT BEDTIME   levothyroxine (SYNTHROID, LEVOTHROID) 75 MCG tablet Take 75 mcg by mouth daily before breakfast.   lidocaine (LIDODERM) 5 % Place 1 patch onto the skin daily. Remove & Discard patch within 12 hours or as directed by MD   ondansetron (ZOFRAN) 4 MG tablet Take 1 tablet (4 mg total) by mouth every 8 (eight) hours as needed for nausea or vomiting.   oxybutynin (DITROPAN XL) 15 MG 24 hr tablet TAKE 1 TABLET BY MOUTH EVERYDAY AT BEDTIME   pantoprazole (PROTONIX) 40 MG tablet TAKE 1 TABLET BY MOUTH TWICE A DAY   venlafaxine (EFFEXOR) 75 MG tablet TAKE 1 TABLET BY MOUTH TWICE A DAY   Vitamin D, Ergocalciferol, (DRISDOL) 1.25 MG (50000 UNIT) CAPS capsule TAKE 1 CAPSULE (50,000 UNITS TOTAL) BY MOUTH EVERY MONDAY.   No facility-administered encounter medications on file as of 12/15/2022.    Allergies (verified) Benzodiazepines, Codeine, Escitalopram oxalate, Nsaids, Sertraline hcl, Statins, and Tramadol hcl   History: Past Medical History:  Diagnosis Date   Anemia    Anxiety    Arthritis    Chronic back pain    Spondylosis and stenosis   Depression    GERD (gastroesophageal reflux disease)    Headache(784.0)    Hiatal hernia    History of blood transfusion    History of GI bleed    Hypothyroidism    Mitral regurgitation    Obsessive-compulsive disorder  Persistent atrial fibrillation (HCC)    Presence of Watchman left atrial appendage closure device 12/03/2021   31mm Watchman FLX with Dr. Excell Seltzer   Recurrent upper respiratory infection (URI)    Sleep apnea    Past Surgical History:  Procedure Laterality Date   ABDOMINAL HYSTERECTOMY     ANTERIOR CERVICAL DECOMP/DISCECTOMY FUSION  12/09/2010   Procedure: ANTERIOR CERVICAL DECOMPRESSION/DISCECTOMY FUSION 3 LEVELS;  Surgeon: Cristi Loron;  Location: [facility];  Service: Neurosurgery;  Laterality: N/A;  Cervical three-four,Cervical four-five,Cervical Five-Six,Cervical Six-Seven ANTERIOR CERVICAL DECOMPRESSION  WITH FUSION INTERBODY PROTHESIS PLATING AND BONEGRAFT   APPENDECTOMY     BIOPSY N/A 11/14/2014   Procedure: GASTRIC BIOPSY;  Surgeon: Corbin Ade, MD;  Location: [facility];  Service: Endoscopy;  Laterality: N/A;   CESAREAN SECTION     CHOLECYSTECTOMY     COLONOSCOPY WITH PROPOFOL N/A 11/14/2014   RMR: redundant colon but otherwise normal   COLONOSCOPY WITH PROPOFOL N/A 05/12/2020   Dr. Jena Gauss;  Seven 4 to 7 mm polyps, diverticulosis in the sigmoid colon. Pathology with tubular adenomas. Recommended repeat in 3 years.   ESOPHAGOGASTRODUODENOSCOPY (EGD) WITH PROPOFOL N/A 11/14/2014   RMR"s/p gastric surgery/anastomtic ulcer   ESOPHAGOGASTRODUODENOSCOPY (EGD) WITH PROPOFOL N/A 09/15/2017   Procedure: ESOPHAGOGASTRODUODENOSCOPY (EGD) WITH PROPOFOL;  Surgeon: Corbin Ade, MD;  Location: [facility];  Service: Endoscopy;  Laterality: N/A;  11:30am   ESOPHAGOGASTRODUODENOSCOPY (EGD) WITH PROPOFOL N/A 04/04/2019   2 cm ulcer crater at anastomosis with smaller satellite areas of ulceration in setting of NSAIDs/Goody powders, no bleeding stigmata.   ESOPHAGOGASTRODUODENOSCOPY (EGD) WITH PROPOFOL N/A 06/13/2019   Rourk: Prior gastric bypass surgery with Billroth II type anatomy, otherwise normal-appearing residual upper GI tract.   ESOPHAGOGASTRODUODENOSCOPY (EGD) WITH PROPOFOL N/A 12/01/2022   Procedure: ESOPHAGOGASTRODUODENOSCOPY (EGD) WITH PROPOFOL;  Surgeon: Corbin Ade, MD;  Location: [facility];  Service: Endoscopy;  Laterality: N/A;  830am, asa 3   EYE SURGERY     bilateral cataracts removed, w/IOL   GASTRIC BYPASS  2008   JOINT REPLACEMENT     07/2010 &  2002- respectively- both knees    LEFT ATRIAL APPENDAGE OCCLUSION N/A 12/03/2021   Procedure: LEFT ATRIAL APPENDAGE OCCLUSION;  Surgeon: Tonny Bollman, MD;  Location: [facility] INVASIVE CV LAB;  Service: Cardiovascular;  Laterality: N/A;   MALONEY DILATION N/A 09/15/2017   Procedure: Elease Hashimoto DILATION;  Surgeon: Corbin Ade, MD;   Location: [facility];  Service: Endoscopy;  Laterality: N/A;   MALONEY DILATION N/A 12/01/2022   Procedure: Elease Hashimoto DILATION;  Surgeon: Corbin Ade, MD;  Location: [facility];  Service: Endoscopy;  Laterality: N/A;   PARATHYROIDECTOMY     PERIPHERALLY INSERTED CENTRAL CATHETER INSERTION     PICC line for treatment of MRSA   POLYPECTOMY  05/12/2020   Procedure: POLYPECTOMY;  Surgeon: Corbin Ade, MD;  Location: [facility];  Service: Endoscopy;;   TEE WITHOUT CARDIOVERSION N/A 12/03/2021   Procedure: TRANSESOPHAGEAL ECHOCARDIOGRAM (TEE);  Surgeon: Tonny Bollman, MD;  [facility] CV [facility];  Service: Cardiovascular;  Laterality: N/A;   TONSILLECTOMY     as a child   Family History  Problem Relation Age of Onset   Anxiety disorder Mother    Breast cancer Mother    Heart attack Father    Anxiety disorder Sister    Breast cancer Sister    Bipolar disorder Daughter    OCD Daughter    Anesthesia problems Neg Hx    Hypotension  Neg Hx    Malignant hyperthermia Neg Hx    Pseudochol deficiency Neg Hx    Dementia Neg Hx    Alcohol abuse Neg Hx    Drug abuse Neg Hx    Depression Neg Hx    Paranoid behavior Neg Hx    Schizophrenia Neg Hx    Seizures Neg Hx    Sexual abuse Neg Hx    Physical abuse Neg Hx    Colon cancer Neg Hx    Social History   Socioeconomic History   Marital status: Widowed    Spouse name: Not on file   Number of children: 4   Years of education: Not on file   Highest education level: Not on file  Occupational History   Not on file  Tobacco Use   Smoking status: Former    Current packs/day: 0.00    Average packs/day: 1 pack/day for 30.0 years (30.0 ttl pk-yrs)    Types: Cigarettes    Start date: 01/19/1968    Quit date: 01/18/1998    Years since quitting: 24.9   Smokeless tobacco: Never  Vaping Use   Vaping status: Never Used  Substance and Sexual Activity   Alcohol use: No   Drug use: No   Sexual activity: Not on file     Comment: Hysterectomy  Other Topics Concern   Not on file  Social History Narrative   Not on file   Social Determinants of Health   Financial Resource Strain: Low Risk  (12/15/2022)   Overall Financial Resource Strain (CARDIA)    Difficulty of Paying Living Expenses: Not hard at all  Food Insecurity: No Food Insecurity (12/15/2022)   Hunger Vital Sign    Worried About Running Out of Food in the Last Year: Never true    Ran Out of Food in the Last Year: Never true  Transportation Needs: No Transportation Needs (12/15/2022)   PRAPARE - Administrator, Civil Service (Medical): No    Lack of Transportation (Non-Medical): No  Physical Activity: Inactive (12/15/2022)   Exercise Vital Sign    Days of Exercise per Week: 0 days    Minutes of Exercise per Session: 0 min  Stress: No Stress Concern Present (12/15/2022)   [employer] of Occupational Health - Occupational Stress Questionnaire    Feeling of Stress : Not at all  Social Connections: Moderately Isolated (12/15/2022)   Social Connection and Isolation Panel [NHANES]    Frequency of Communication with Friends and Family: More than three times a week    Frequency of Social Gatherings with Friends and Family: Three times a week    Attends Religious Services: 1 to 4 times per year    Active Member of Clubs or Organizations: No    Attends Banker Meetings: Never    Marital Status: Widowed    Tobacco Counseling Counseling given: Not Answered   Clinical Intake:  Pre-visit preparation completed: Yes  Pain : No/denies pain     Diabetes: No  How often do you need to have someone help you when you read instructions, pamphlets, or other written materials from your doctor or pharmacy?: 3 - Sometimes  Interpreter Needed?: No  Comments: Assisted with visit by daughter [name] Information entered by :: Kandis Fantasia LPN   Activities of Daily Living    12/15/2022    4:09 PM 11/26/2022    8:49 AM   In your present state of health, do you have any difficulty performing the following  activities:  Hearing? 0   Vision? 0   Difficulty concentrating or making decisions? 0   Walking or climbing stairs? 0   Dressing or bathing? 0   Doing errands, shopping? 0 0  Preparing Food and eating ? N   Using the Toilet? N   In the past six months, have you accidently leaked urine? N   Do you have problems with loss of bowel control? N   Managing your Medications? N   Managing your Finances? N   Housekeeping or managing your Housekeeping? N     Patient Care Team: Del Newman Nip, Tenna Child, FNP as PCP - General (Family Medicine) Jonelle Sidle, MD as PCP - Cardiology (Cardiology) Jena Gauss Gerrit Friends, MD as Consulting Physician (Gastroenterology)  Indicate any recent Medical Services you may have received from other than [facility] providers in the past year (date may be approximate).     Assessment:   This is a routine wellness examination for [place].  Hearing/Vision screen Hearing Screening - Comments:: Hard of hearing; recently started wearing hearing aids  Vision Screening - Comments:: No vision problems; will schedule routine eye exam soon     Goals Addressed             This Visit's Progress    Prevent falls        Depression Screen    12/15/2022    4:06 PM  PHQ 2/9 Scores  PHQ - 2 Score 0    Fall Risk    12/15/2022    4:08 PM 09/09/2022   10:13 AM  Fall Risk   Falls in the past year? 0 0  Number falls in past yr: 0 0  Injury with Fall? 0   Risk for fall due to : Impaired mobility;Impaired balance/gait   Follow up Falls prevention discussed;Education provided;Falls evaluation completed     MEDICARE RISK AT HOME: Medicare Risk at Home Any stairs in or around the home?: No If so, are there any without handrails?: No Home free of loose throw rugs in walkways, pet beds, electrical cords, etc?: Yes Adequate lighting in your home to reduce risk of falls?: Yes Life  alert?: No Use of a cane, walker or w/c?: No Grab bars in the bathroom?: Yes Shower chair or bench in shower?: No Elevated toilet seat or a handicapped toilet?: Yes  TIMED UP AND GO:  Was the test performed? No    Cognitive Function:    12/15/2022    4:11 PM  MMSE - Mini Mental State Exam  Not completed: Unable to complete        Immunizations  There is no immunization history on file for this patient.  TDAP status: Due, Education has been provided regarding the importance of this vaccine. Advised may receive this vaccine at local pharmacy or Health Dept. Aware to provide a copy of the vaccination record if obtained from local pharmacy or Health Dept. Verbalized acceptance and understanding.  Flu Vaccine status: Due, Education has been provided regarding the importance of this vaccine. Advised may receive this vaccine at local pharmacy or Health Dept. Aware to provide a copy of the vaccination record if obtained from local pharmacy or Health Dept. Verbalized acceptance and understanding.  Pneumococcal vaccine status: Due, Education has been provided regarding the importance of this vaccine. Advised may receive this vaccine at local pharmacy or Health Dept. Aware to provide a copy of the vaccination record if obtained from local pharmacy or Health Dept. Verbalized acceptance and understanding.  Covid-19  vaccine status: Information provided on how to obtain vaccines.   Qualifies for Shingles Vaccine? Yes   Zostavax completed No   Shingrix Completed?: No.    Education has been provided regarding the importance of this vaccine. Patient has been advised to call insurance company to determine out of pocket expense if they have not yet received this vaccine. Advised may also receive vaccine at local pharmacy or Health Dept. Verbalized acceptance and understanding.  Screening Tests Health Maintenance  Topic Date Due   COVID-19 Vaccine (1) Never done   DTaP/Tdap/Td (1 - Tdap) Never  done   Zoster Vaccines- Shingrix (1 of 2) Never done   Pneumonia Vaccine 57+ Years old (1 of 1 - PCV) Never done   DEXA SCAN  Never done   INFLUENZA VACCINE  08/19/2022   Medicare Annual Wellness (AWV)  12/15/2023   Hepatitis C Screening  Completed   HPV VACCINES  Aged Out   Colonoscopy  Discontinued    Health Maintenance  Health Maintenance Due  Topic Date Due   COVID-19 Vaccine (1) Never done   DTaP/Tdap/Td (1 - Tdap) Never done   Zoster Vaccines- Shingrix (1 of 2) Never done   Pneumonia Vaccine 84+ Years old (1 of 1 - PCV) Never done   DEXA SCAN  Never done   INFLUENZA VACCINE  08/19/2022    Colorectal cancer screening: No longer required.   Mammogram status: No longer required due to age and preference.  Bone Density status: Ordered 09/09/22. Pt provided with contact info and advised to call to schedule appt.  Lung Cancer Screening: (Low Dose CT Chest recommended if Age 15-80 years, 20 pack-year currently smoking OR have quit w/in 15years.) does not qualify.   Lung Cancer Screening Referral: n/a  Additional Screening:  Hepatitis C Screening: does qualify; Completed 09/17/22  Vision Screening: Recommended annual ophthalmology exams for early detection of glaucoma and other disorders of the eye. Is the patient up to date with their annual eye exam?  No  Who is the provider or what is the name of the office in which the patient attends annual eye exams? none If pt is not established with a provider, would they like to be referred to a provider to establish care? No .   Dental Screening: Recommended annual dental exams for proper oral hygiene  Community Resource Referral / Chronic Care Management: CRR required this visit?  No   CCM required this visit?  No     Plan:     I have personally reviewed and noted the following in the patient's chart:   Medical and social history Use of alcohol, tobacco or illicit drugs  Current medications and supplements including  opioid prescriptions. Patient is not currently taking opioid prescriptions. Functional ability and status Nutritional status Physical activity Advanced directives List of other physicians Hospitalizations, surgeries, and ER visits in previous 12 months Vitals Screenings to include cognitive, depression, and falls Referrals and appointments  In addition, I have reviewed and discussed with patient certain preventive protocols, quality metrics, and best practice recommendations. A written personalized care plan for preventive services as well as general preventive health recommendations were provided to patient.     Kandis Fantasia [place], [place]   [id number]   After Visit Summary: (MyChart) Due to this being a telephonic visit, the after visit summary with patients personalized plan was offered to patient via MyChart   Nurse Notes: Daughter is concerned that there have been no positive effects from the Aricept

## 2022-12-15 NOTE — Telephone Encounter (Signed)
Echo results discussed with daughter, I advised her to disregard the letter that was mailed to patient.

## 2022-12-15 NOTE — Patient Instructions (Signed)
Ms. Schlesser , Thank you for taking time to come for your Medicare Wellness Visit. I appreciate your ongoing commitment to your health goals. Please review the following plan we discussed and let me know if I can assist you in the future.   Referrals/Orders/Follow-Ups/Clinician Recommendations: Aim for 30 minutes of exercise or brisk walking, 6-8 glasses of water, and 5 servings of fruits and vegetables each day.  This is a list of the screening recommended for you and due dates:  Health Maintenance  Topic Date Due   COVID-19 Vaccine (1) Never done   DTaP/Tdap/Td vaccine (1 - Tdap) Never done   Zoster (Shingles) Vaccine (1 of 2) Never done   Pneumonia Vaccine (1 of 1 - PCV) Never done   DEXA scan (bone density measurement)  Never done   Flu Shot  08/19/2022   Medicare Annual Wellness Visit  12/15/2023   Hepatitis C Screening  Completed   HPV Vaccine  Aged Out   Colon Cancer Screening  Discontinued    Advanced directives: (ACP Link)Information on Advanced Care Planning can be found at [facility] Advance Health Care Directives Advance Health Care Directives ([email or web address]   Next Medicare Annual Wellness Visit scheduled for next year: Yes

## 2022-12-15 NOTE — Telephone Encounter (Signed)
Left a message for daughter to call office back regarding pt's results.

## 2022-12-15 NOTE — Telephone Encounter (Signed)
Daughter returned staff call regarding results.

## 2022-12-29 ENCOUNTER — Encounter: Payer: Self-pay | Admitting: Neurology

## 2022-12-29 ENCOUNTER — Ambulatory Visit (INDEPENDENT_AMBULATORY_CARE_PROVIDER_SITE_OTHER): Payer: PPO | Admitting: Neurology

## 2022-12-29 ENCOUNTER — Encounter (HOSPITAL_COMMUNITY): Payer: Self-pay | Admitting: Hematology & Oncology

## 2022-12-29 VITALS — BP 156/86 | HR 80 | Ht 62.0 in | Wt 166.8 lb

## 2022-12-29 DIAGNOSIS — G309 Alzheimer's disease, unspecified: Secondary | ICD-10-CM

## 2022-12-29 DIAGNOSIS — F02818 Dementia in other diseases classified elsewhere, unspecified severity, with other behavioral disturbance: Secondary | ICD-10-CM

## 2022-12-29 DIAGNOSIS — Z9884 Bariatric surgery status: Secondary | ICD-10-CM

## 2022-12-29 DIAGNOSIS — Z87820 Personal history of traumatic brain injury: Secondary | ICD-10-CM

## 2022-12-29 DIAGNOSIS — H833X3 Noise effects on inner ear, bilateral: Secondary | ICD-10-CM

## 2022-12-29 MED ORDER — SERTRALINE HCL 25 MG PO TABS: 25.0000 mg | ORAL_TABLET | Freq: Every day | ORAL | 5 refills | Status: DC
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
## 2022-12-29 NOTE — Patient Instructions (Signed)
1_Dementia versus posttraumatic cognitive impairment, low educational background.   Eliminating the reading, writing and math part of the MMSE she is still challenged by clock drawing, 3 word recall,    1)  8-30 -2024; elevated blood glucose,  low protein, CKD grade 2 GFR 38 mL/min. Vit B 12, TSH and T4 normal. Cholesterol total of 200. Normal CBC.      2) she has been prescribed aricept but everything makes her sick to her stomach.    Plan:  Treatment plan and additional workup planned after today includes:    1)  change Aricept to patch for memory drugs.   2)  start a multivitamin, Gummies.      3) establish some routines.  Meal times, bed times, house work.    4) chronic anhedonia .treat for depression, Zoloft 25 mg.    Caveat : she has overcome addition to benzodiazepines. None prescribed.  MRI and zoloft to start now.  Referral for neurocognitive exam.      Melvyn Novas, MD

## 2022-12-30 NOTE — Progress Notes (Unsigned)
Referring Provider: Wylene Men* Primary Care Physician:  Rica Records, FNP Primary GI Physician: Dr. Bonnetta Barry chief complaint on file.   HPI:   [name] is a 77 y.o. female with history of gastric bypass in 2007, IDA, anastomotic ulcer in 2016 and March 2021 with follow-up EGD in May 2021 with ulcer healed, GERD, chronic N/V, chronic constipation, adenomatous colon polyps due for surveillance colonoscopy in April 2025, chronic right sided abdominal pain with known right lateral abdominal wall hernia and right sided spigelian hernia, presenting today for follow-up ***  Last seen in the office 10/01/2022.  Breakthrough GERD every couple of days on pantoprazole twice daily.  Recently failed Prevacid twice daily.  Admitted to eating a lot of spaghetti, fried foods, tomatoes.  Also reported solid food dysphagia for the last couple of months.  Continue with daily nausea, occasional vomiting of liquid with no real change over the years.  No further weight loss, had actually gained some weight.  Described previously taking what sounded like Reglan per PCP which did help her nausea, but she opted not to continue this due to possible side effects.  Ongoing chronic, uncontrolled constipation, not taking Amitiza as this was not affordable but did work well.  Did not want to take lactulose as it made her more nauseated.  Also continued with chronic abdominal pain that was unchanged, worse on the right side and noted some improvement when able to have a good bowel movement.  Overall, pain did not seem to be limiting her daily activities.  She is taking Tylenol as needed.  Plan included following strict GERD diet, continuing pantoprazole EGD, MiraLAX daily, Zofran as needed.  EGD 12/01/2022: Normal esophagus s/p empiric dilation, small hiatal hernia, abnormal residual gastric mucosa s/p biopsy.  Recommended continuing current medications, increase MiraLAX to 17 g twice daily, follow-up  in 1 month.  Pathology showed gastric oxyntic mucosa with changes consistent with PPI therapy, negative for H. pylori.   Today:      CT A/P w/contrast 05/13/21 with no acute abnormalities.  She had a moderate amount of stool in her colon and a small abdominal wall hernia in the right lower quadrant containing containing fat without complicating factors. Pancreas is atrophic, but otherwise unremarkable.    Gastric emptying study 05/25/21 was normal.      Past Medical History:  Diagnosis Date   Anemia    Anxiety    Arthritis    Chronic back pain    Spondylosis and stenosis   Depression    GERD (gastroesophageal reflux disease)    Headache(784.0)    Hiatal hernia    History of blood transfusion    History of GI bleed    Hypothyroidism    Mitral regurgitation    Obsessive-compulsive disorder    Persistent atrial fibrillation (HCC)    Presence of Watchman left atrial appendage closure device 12/03/2021   31mm Watchman FLX with Dr. Excell Seltzer   Recurrent upper respiratory infection (URI)    Sleep apnea     Past Surgical History:  Procedure Laterality Date   ABDOMINAL HYSTERECTOMY     ANTERIOR CERVICAL DECOMP/DISCECTOMY FUSION  12/09/2010   Procedure: ANTERIOR CERVICAL DECOMPRESSION/DISCECTOMY FUSION 3 LEVELS;  Surgeon: Cristi Loron;  Location: [facility];  Service: Neurosurgery;  Laterality: N/A;  Cervical three-four,Cervical four-five,Cervical Five-Six,Cervical Six-Seven ANTERIOR CERVICAL DECOMPRESSION WITH FUSION INTERBODY PROTHESIS PLATING AND BONEGRAFT   APPENDECTOMY     BIOPSY N/A 11/14/2014  Procedure: GASTRIC BIOPSY;  Surgeon: Corbin Ade, MD;  Location: [facility];  Service: Endoscopy;  Laterality: N/A;   CESAREAN SECTION     CHOLECYSTECTOMY     COLONOSCOPY WITH PROPOFOL N/A 11/14/2014   RMR: redundant colon but otherwise normal   COLONOSCOPY WITH PROPOFOL N/A 05/12/2020   Dr. Jena Gauss;  Seven 4 to 7 mm polyps, diverticulosis in the sigmoid colon. Pathology with  tubular adenomas. Recommended repeat in 3 years.   ESOPHAGOGASTRODUODENOSCOPY (EGD) WITH PROPOFOL N/A 11/14/2014   RMR"s/p gastric surgery/anastomtic ulcer   ESOPHAGOGASTRODUODENOSCOPY (EGD) WITH PROPOFOL N/A 09/15/2017   Procedure: ESOPHAGOGASTRODUODENOSCOPY (EGD) WITH PROPOFOL;  Surgeon: Corbin Ade, MD;  Location: [facility];  Service: Endoscopy;  Laterality: N/A;  11:30am   ESOPHAGOGASTRODUODENOSCOPY (EGD) WITH PROPOFOL N/A 04/04/2019   2 cm ulcer crater at anastomosis with smaller satellite areas of ulceration in setting of NSAIDs/Goody powders, no bleeding stigmata.   ESOPHAGOGASTRODUODENOSCOPY (EGD) WITH PROPOFOL N/A 06/13/2019   Rourk: Prior gastric bypass surgery with Billroth II type anatomy, otherwise normal-appearing residual upper GI tract.   ESOPHAGOGASTRODUODENOSCOPY (EGD) WITH PROPOFOL N/A 12/01/2022   Procedure: ESOPHAGOGASTRODUODENOSCOPY (EGD) WITH PROPOFOL;  Surgeon: Corbin Ade, MD;  Location: [facility];  Service: Endoscopy;  Laterality: N/A;  830am, asa 3   EYE SURGERY     bilateral cataracts removed, w/IOL   GASTRIC BYPASS  2008   JOINT REPLACEMENT     07/2010 &  2002- respectively- both knees    LEFT ATRIAL APPENDAGE OCCLUSION N/A 12/03/2021   Procedure: LEFT ATRIAL APPENDAGE OCCLUSION;  Surgeon: Tonny Bollman, MD;  Location: [facility];  Service: Cardiovascular;  Laterality: N/A;   MALONEY DILATION N/A 09/15/2017   Procedure: Elease Hashimoto DILATION;  Surgeon: Corbin Ade, MD;  Location: [facility];  Service: Endoscopy;  Laterality: N/A;   MALONEY DILATION N/A 12/01/2022   Procedure: Elease Hashimoto DILATION;  Surgeon: Corbin Ade, MD;  Location: [facility];  Service: Endoscopy;  Laterality: N/A;   PARATHYROIDECTOMY     PERIPHERALLY INSERTED CENTRAL CATHETER INSERTION     PICC line for treatment of MRSA   POLYPECTOMY  05/12/2020   Procedure: POLYPECTOMY;  Surgeon: Corbin Ade, MD;  Location: [facility];  Service: Endoscopy;;   TEE  WITHOUT CARDIOVERSION N/A 12/03/2021   Procedure: TRANSESOPHAGEAL ECHOCARDIOGRAM (TEE);  Surgeon: Tonny Bollman, MD;  Location: [facility] CV [facility];  Service: Cardiovascular;  Laterality: N/A;   TONSILLECTOMY     as a child    Current Outpatient Medications  Medication Sig Dispense Refill   atenolol (TENORMIN) 50 MG tablet Take 1 tablet (50 mg total) by mouth daily. 90 tablet 3   cyanocobalamin (VITAMIN B12) 1000 MCG/ML injection Inject 1,000 mcg into the muscle every 30 (thirty) days.     diltiazem (CARDIZEM CD) 240 MG 24 hr capsule Take 1 capsule (240 mg total) by mouth daily. 90 capsule 3   donepezil (ARICEPT) 5 MG tablet TAKE 1 TABLET BY MOUTH EVERYDAY AT BEDTIME 90 tablet 1   levothyroxine (SYNTHROID, LEVOTHROID) 75 MCG tablet Take 75 mcg by mouth daily before breakfast.     lidocaine (LIDODERM) 5 % Place 1 patch onto the skin daily. Remove & Discard patch within 12 hours or as directed by MD 30 patch 0   ondansetron (ZOFRAN) 4 MG tablet Take 1 tablet (4 mg total) by mouth every 8 (eight) hours as needed for nausea or vomiting. 30 tablet 1   oxybutynin (DITROPAN XL) 15 MG 24 hr tablet  TAKE 1 TABLET BY MOUTH EVERYDAY AT BEDTIME 90 tablet 1   pantoprazole (PROTONIX) 40 MG tablet TAKE 1 TABLET BY MOUTH TWICE A DAY 180 tablet 1   sertraline (ZOLOFT) 25 MG tablet Take 1 tablet (25 mg total) by mouth at bedtime. 30 tablet 5   Vitamin D, Ergocalciferol, (DRISDOL) 1.25 MG (50000 UNIT) CAPS capsule TAKE 1 CAPSULE (50,000 UNITS TOTAL) BY MOUTH EVERY MONDAY. 12 capsule 1   No current facility-administered medications for this visit.    Allergies as of 12/31/2022 - Review Complete 12/29/2022  Allergen Reaction Noted   Benzodiazepines Other (See Comments) 12/03/2011   Codeine Nausea Only    Escitalopram oxalate Nausea Only 12/02/2010   Nsaids Other (See Comments) 12/22/2012   Sertraline hcl Nausea Only    Statins Nausea And Vomiting    Tramadol hcl Other (See Comments) 12/22/2012    Family  History  Problem Relation Age of Onset   Anxiety disorder Mother    Breast cancer Mother    Heart attack Father    Anxiety disorder Sister    Breast cancer Sister    Bipolar disorder Daughter    OCD Daughter    Anesthesia problems Neg Hx    Hypotension Neg Hx    Malignant hyperthermia Neg Hx    Pseudochol deficiency Neg Hx    Dementia Neg Hx    Alcohol abuse Neg Hx    Drug abuse Neg Hx    Depression Neg Hx    Paranoid behavior Neg Hx    Schizophrenia Neg Hx    Seizures Neg Hx    Sexual abuse Neg Hx    Physical abuse Neg Hx    Colon cancer Neg Hx     Social History   Socioeconomic History   Marital status: Widowed    Spouse name: Not on file   Number of children: 4   Years of education: Not on file   Highest education level: Not on file  Occupational History   Not on file  Tobacco Use   Smoking status: Former    Current packs/day: 0.00    Average packs/day: 1 pack/day for 30.0 years (30.0 ttl pk-yrs)    Types: Cigarettes    Start date: 01/19/1968    Quit date: 01/18/1998    Years since quitting: 24.9   Smokeless tobacco: Never  Vaping Use   Vaping status: Never Used  Substance and Sexual Activity   Alcohol use: No   Drug use: No   Sexual activity: Not on file    Comment: Hysterectomy  Other Topics Concern   Not on file  Social History Narrative   Not on file   Social Drivers of Health   Financial Resource Strain: Low Risk  (12/15/2022)   Overall Financial Resource Strain (CARDIA)    Difficulty of Paying Living Expenses: Not hard at all  Food Insecurity: No Food Insecurity (12/15/2022)   Hunger Vital Sign    Worried About Running Out of Food in the Last Year: Never true    Ran Out of Food in the Last Year: Never true  Transportation Needs: No Transportation Needs (12/15/2022)   PRAPARE - Administrator, Civil Service (Medical): No    Lack of Transportation (Non-Medical): No  Physical Activity: Inactive (12/15/2022)   Exercise Vital Sign     Days of Exercise per Week: 0 days    Minutes of Exercise per Session: 0 min  Stress: No Stress Concern Present (12/15/2022)   [employer] of  Occupational Health - Occupational Stress Questionnaire    Feeling of Stress : Not at all  Social Connections: Moderately Isolated (12/15/2022)   Social Connection and Isolation Panel [NHANES]    Frequency of Communication with Friends and Family: More than three times a week    Frequency of Social Gatherings with Friends and Family: Three times a week    Attends Religious Services: 1 to 4 times per year    Active Member of Clubs or Organizations: No    Attends Banker Meetings: Never    Marital Status: Widowed    Review of Systems: Gen: Denies fever, chills, anorexia. Denies fatigue, weakness, weight loss.  CV: Denies chest pain, palpitations, syncope, peripheral edema, and claudication. Resp: Denies dyspnea at rest, cough, wheezing, coughing up blood, and pleurisy. GI: Denies vomiting blood, jaundice, and fecal incontinence.   Denies dysphagia or odynophagia. Derm: Denies rash, itching, dry skin Psych: Denies depression, anxiety, memory loss, confusion. No homicidal or suicidal ideation.  Heme: Denies bruising, bleeding, and enlarged lymph nodes.  Physical Exam: There were no vitals taken for this visit. General:   Alert and oriented. No distress noted. Pleasant and cooperative.  Head:  Normocephalic and atraumatic. Eyes:  Conjuctiva clear without scleral icterus. Heart:  S1, S2 present without murmurs appreciated. Lungs:  Clear to auscultation bilaterally. No wheezes, rales, or rhonchi. No distress.  Abdomen:  +BS, soft, non-tender and non-distended. No rebound or guarding. No HSM or masses noted. Msk:  Symmetrical without gross deformities. Normal posture. Extremities:  Without edema. Neurologic:  Alert and  oriented x4 Psych:  Normal mood and affect.    Assessment:     Plan:  ***   Ermalinda Memos,  PA-C [facility] 12/31/2022

## 2022-12-31 ENCOUNTER — Ambulatory Visit: Payer: PPO | Admitting: Gastroenterology

## 2023-01-01 LAB — ATN PROFILE
A -- Beta-amyloid 42/40 Ratio: 0.136 (ref >0.102)
Beta-amyloid 40: 245.49 pg/mL
Beta-amyloid 42: 33.42 pg/mL
N -- NfL, Plasma: 28 pg/mL — ABNORMAL HIGH (ref 0.00–7.64)
T -- p-tau181: 1.31 pg/mL — ABNORMAL HIGH (ref 0.00–0.97)

## 2023-01-04 ENCOUNTER — Telehealth: Payer: Self-pay | Admitting: Neurology

## 2023-01-04 ENCOUNTER — Telehealth: Payer: Self-pay | Admitting: *Deleted

## 2023-01-04 NOTE — Progress Notes (Unsigned)
Referring Provider: Wylene Men* Primary Care Physician:  Rica Records, FNP Primary GI Physician: Dr. Jena Gauss  Chief Complaint  Patient presents with   Follow-up    Follow up. Stomach is burning     HPI:   [name] is a 77 y.o. female with history of gastric bypass in 2007, IDA, anastomotic ulcer in 2016 and March 2021 with follow-up EGD in May 2021 with ulcer healed, GERD, chronic N/V, chronic constipation, adenomatous colon polyps due for surveillance colonoscopy in April 2025, chronic right sided abdominal pain with known right lateral abdominal wall hernia and right sided spigelian hernia, presenting today for follow-up .    Last seen in the office 10/01/2022.  Breakthrough GERD every couple of days on pantoprazole twice daily.  Recently failed Prevacid twice daily.  Admitted to eating a lot of spaghetti, fried foods, tomatoes.  Also reported solid food dysphagia for the last couple of months.  Continue with daily nausea, occasional vomiting of liquid with no real change over the years.  No further weight loss, had actually gained some weight.  Described previously taking what sounded like Reglan per PCP which did help her nausea, but she opted not to continue this due to possible side effects.  Ongoing chronic, uncontrolled constipation, not taking Amitiza as this was not affordable but did work well.  Did not want to take lactulose as it made her more nauseated.  Also continued with chronic abdominal pain that was unchanged, worse on the right side and noted some improvement when able to have a good bowel movement.  Overall, pain did not seem to be limiting her daily activities.  She is taking Tylenol as needed.  Plan included following strict GERD diet, continuing pantoprazole, EGD, MiraLAX daily, Zofran as needed.   EGD 12/01/2022: Normal esophagus s/p empiric dilation, small hiatal hernia, abnormal residual gastric mucosa s/p biopsy.  Recommended continuing  current medications, increase MiraLAX to 17 g twice daily, follow-up in 1 month.  Pathology showed gastric oxyntic mucosa with changes consistent with PPI therapy, negative for H. pylori.     Today: GERD:  Breakthrough 2-3 times a week. Usually in the afternoon, but unable to tell me if this is before or after dinner versus at bedtime.  She is taking pantoprazole twice daily.  She is avoiding fried, fatty, greasy, spicy foods for the most part.    Dysphagia:  Improved with esophageal dilation.  Constipation:  Bowels moving daily. Taking MiraLAX twice a day.  No BRBPR or melena.  Nausea/vomiting:  Much improved with Zofran once daily. No vomiting.   Continues with a burning pain in the right abdomen.  States this is the same pain that she has had for years without any significant change.  Does not limit her from her daily activities or eating.  Daughter states patient does not complain about this to her.  She has lost some weight recently.  Daughter states that they were told that she was borderline diabetic and she has changed her mother's diet significantly.  She has cut out all vegetable oils, pasta, fatty foods, and is eating much healthier overall.     CT A/P w/contrast 05/13/21 with no acute abnormalities.  She had a moderate amount of stool in her colon and a small abdominal wall hernia in the right lower quadrant containing containing fat without complicating factors. Pancreas is atrophic, but otherwise unremarkable.    Gastric emptying study 05/25/21 was normal.  CT A/P without  contrast 10/16/2022 with no acute findings.  Past Medical History:  Diagnosis Date   Anemia    Anxiety    Arthritis    Chronic back pain    Spondylosis and stenosis   Depression    GERD (gastroesophageal reflux disease)    Headache(784.0)    Hiatal hernia    History of blood transfusion    History of GI bleed    Hypothyroidism    Mitral regurgitation    Obsessive-compulsive disorder     Persistent atrial fibrillation (HCC)    Presence of Watchman left atrial appendage closure device 12/03/2021   31mm Watchman FLX with Dr. Excell Seltzer   Recurrent upper respiratory infection (URI)    Sleep apnea     Past Surgical History:  Procedure Laterality Date   ABDOMINAL HYSTERECTOMY     ANTERIOR CERVICAL DECOMP/DISCECTOMY FUSION  12/09/2010   Procedure: ANTERIOR CERVICAL DECOMPRESSION/DISCECTOMY FUSION 3 LEVELS;  Surgeon: Cristi Loron;  Location: [facility];  Service: Neurosurgery;  Laterality: N/A;  Cervical three-four,Cervical four-five,Cervical Five-Six,Cervical Six-Seven ANTERIOR CERVICAL DECOMPRESSION WITH FUSION INTERBODY PROTHESIS PLATING AND BONEGRAFT   APPENDECTOMY     BIOPSY N/A 11/14/2014   Procedure: GASTRIC BIOPSY;  Surgeon: Corbin Ade, MD;  Location: [facility];  Service: Endoscopy;  Laterality: N/A;   CESAREAN SECTION     CHOLECYSTECTOMY     COLONOSCOPY WITH PROPOFOL N/A 11/14/2014   RMR: redundant colon but otherwise normal   COLONOSCOPY WITH PROPOFOL N/A 05/12/2020   Dr. Jena Gauss;  Seven 4 to 7 mm polyps, diverticulosis in the sigmoid colon. Pathology with tubular adenomas. Recommended repeat in 3 years.   ESOPHAGOGASTRODUODENOSCOPY (EGD) WITH PROPOFOL N/A 11/14/2014   RMR"s/p gastric surgery/anastomtic ulcer   ESOPHAGOGASTRODUODENOSCOPY (EGD) WITH PROPOFOL N/A 09/15/2017   Procedure: ESOPHAGOGASTRODUODENOSCOPY (EGD) WITH PROPOFOL;  Surgeon: Corbin Ade, MD;  Location: [facility];  Service: Endoscopy;  Laterality: N/A;  11:30am   ESOPHAGOGASTRODUODENOSCOPY (EGD) WITH PROPOFOL N/A 04/04/2019   2 cm ulcer crater at anastomosis with smaller satellite areas of ulceration in setting of NSAIDs/Goody powders, no bleeding stigmata.   ESOPHAGOGASTRODUODENOSCOPY (EGD) WITH PROPOFOL N/A 06/13/2019   Rourk: Prior gastric bypass surgery with Billroth II type anatomy, otherwise normal-appearing residual upper GI tract.   ESOPHAGOGASTRODUODENOSCOPY (EGD) WITH PROPOFOL N/A  12/01/2022   Procedure: ESOPHAGOGASTRODUODENOSCOPY (EGD) WITH PROPOFOL;  Surgeon: Corbin Ade, MD;  Location: [facility];  Service: Endoscopy;  Laterality: N/A;  830am, asa 3   EYE SURGERY     bilateral cataracts removed, w/IOL   GASTRIC BYPASS  2008   JOINT REPLACEMENT     07/2010 &  2002- respectively- both knees    LEFT ATRIAL APPENDAGE OCCLUSION N/A 12/03/2021   Procedure: LEFT ATRIAL APPENDAGE OCCLUSION;  Surgeon: Tonny Bollman, MD;  Location: [facility] INVASIVE CV LAB;  Service: Cardiovascular;  Laterality: N/A;   MALONEY DILATION N/A 09/15/2017   Procedure: Elease Hashimoto DILATION;  Surgeon: Corbin Ade, MD;  Location: [facility];  Service: Endoscopy;  Laterality: N/A;   MALONEY DILATION N/A 12/01/2022   Procedure: Elease Hashimoto DILATION;  Surgeon: Corbin Ade, MD;  Location: [facility];  Service: Endoscopy;  Laterality: N/A;   PARATHYROIDECTOMY     PERIPHERALLY INSERTED CENTRAL CATHETER INSERTION     PICC line for treatment of MRSA   POLYPECTOMY  05/12/2020   Procedure: POLYPECTOMY;  Surgeon: Corbin Ade, MD;  Location: [facility];  Service: Endoscopy;;   TEE WITHOUT CARDIOVERSION N/A 12/03/2021   Procedure: TRANSESOPHAGEAL ECHOCARDIOGRAM (TEE);  Surgeon: Tonny Bollman, MD;  Location: [facility]VASIVE CV LAB;  Service: Cardiovascular;  Laterality: N/A;   TONSILLECTOMY     as a child    Current Outpatient Medications  Medication Sig Dispense Refill   atenolol (TENORMIN) 50 MG tablet Take 1 tablet (50 mg total) by mouth daily. 90 tablet 3   cyanocobalamin (VITAMIN B12) 1000 MCG/ML injection Inject 1,000 mcg into the muscle every 30 (thirty) days.     diltiazem (CARDIZEM CD) 240 MG 24 hr capsule Take 1 capsule (240 mg total) by mouth daily. 90 capsule 3   donepezil (ARICEPT) 5 MG tablet TAKE 1 TABLET BY MOUTH EVERYDAY AT BEDTIME 90 tablet 1   levothyroxine (SYNTHROID, LEVOTHROID) 75 MCG tablet Take 75 mcg by mouth daily before breakfast.     lidocaine (LIDODERM) 5 % Place 1  patch onto the skin daily. Remove & Discard patch within 12 hours or as directed by MD 30 patch 0   oxybutynin (DITROPAN XL) 15 MG 24 hr tablet TAKE 1 TABLET BY MOUTH EVERYDAY AT BEDTIME 90 tablet 1   RABEprazole (ACIPHEX) 20 MG tablet Take 1 tablet (20 mg total) by mouth 2 (two) times daily before a meal. 60 tablet 3   sertraline (ZOLOFT) 25 MG tablet Take 1 tablet (25 mg total) by mouth at bedtime. 30 tablet 5   venlafaxine (EFFEXOR) 75 MG tablet Take 75 mg by mouth 2 (two) times daily.     Vitamin D, Ergocalciferol, (DRISDOL) 1.25 MG (50000 UNIT) CAPS capsule TAKE 1 CAPSULE (50,000 UNITS TOTAL) BY MOUTH EVERY MONDAY. 12 capsule 1   ondansetron (ZOFRAN) 4 MG tablet Take 1 tablet (4 mg total) by mouth every 8 (eight) hours as needed for nausea or vomiting. 30 tablet 3   No current facility-administered medications for this visit.    Allergies as of 01/06/2023 - Review Complete 01/06/2023  Allergen Reaction Noted   Benzodiazepines Other (See Comments) 12/03/2011   Codeine Nausea Only    Escitalopram oxalate Nausea Only 12/02/2010   Nsaids Other (See Comments) 12/22/2012   Sertraline hcl Nausea Only    Statins Nausea And Vomiting    Tramadol hcl Other (See Comments) 12/22/2012    Family History  Problem Relation Age of Onset   Anxiety disorder Mother    Breast cancer Mother    Heart attack Father    Anxiety disorder Sister    Breast cancer Sister    Bipolar disorder Daughter    OCD Daughter    Anesthesia problems Neg Hx    Hypotension Neg Hx    Malignant hyperthermia Neg Hx    Pseudochol deficiency Neg Hx    Dementia Neg Hx    Alcohol abuse Neg Hx    Drug abuse Neg Hx    Depression Neg Hx    Paranoid behavior Neg Hx    Schizophrenia Neg Hx    Seizures Neg Hx    Sexual abuse Neg Hx    Physical abuse Neg Hx    Colon cancer Neg Hx     Social History   Socioeconomic History   Marital status: Widowed    Spouse name: Not on file   Number of children: 4   Years of  education: Not on file   Highest education level: Not on file  Occupational History   Not on file  Tobacco Use   Smoking status: Former    Current packs/day: 0.00    Average packs/day: 1 pack/day for 30.0 years (30.0 ttl pk-yrs)  Types: Cigarettes    Start date: 01/19/1968    Quit date: 01/18/1998    Years since quitting: 24.9   Smokeless tobacco: Never  Vaping Use   Vaping status: Never Used  Substance and Sexual Activity   Alcohol use: No   Drug use: No   Sexual activity: Not on file    Comment: Hysterectomy  Other Topics Concern   Not on file  Social History Narrative   Not on file   Social Drivers of Health   Financial Resource Strain: Low Risk  (12/15/2022)   Overall Financial Resource Strain (CARDIA)    Difficulty of Paying Living Expenses: Not hard at all  Food Insecurity: No Food Insecurity (12/15/2022)   Hunger Vital Sign    Worried About Running Out of Food in the Last Year: Never true    Ran Out of Food in the Last Year: Never true  Transportation Needs: No Transportation Needs (12/15/2022)   PRAPARE - Administrator, Civil Service (Medical): No    Lack of Transportation (Non-Medical): No  Physical Activity: Inactive (12/15/2022)   Exercise Vital Sign    Days of Exercise per Week: 0 days    Minutes of Exercise per Session: 0 min  Stress: No Stress Concern Present (12/15/2022)   [employer] of Occupational Health - Occupational Stress Questionnaire    Feeling of Stress : Not at all  Social Connections: Moderately Isolated (12/15/2022)   Social Connection and Isolation Panel [NHANES]    Frequency of Communication with Friends and Family: More than three times a week    Frequency of Social Gatherings with Friends and Family: Three times a week    Attends Religious Services: 1 to 4 times per year    Active Member of Clubs or Organizations: No    Attends Banker Meetings: Never    Marital Status: Widowed    Review of  Systems: Gen: Denies fever, chills, cold or flulike symptoms, presyncope, syncope. GI: See HPI Heme: See HPI  Physical Exam: BP 113/79 (BP Location: Left Arm, Patient Position: Sitting, Cuff Size: Large)   Pulse 79   Temp 98 F (36.7 C) (Temporal)   Ht 5\' 5"  (1.651 m)   Wt 168 lb (76.2 kg)   BMI 27.96 kg/m  General:   Alert and oriented. No distress noted. Pleasant and cooperative.  Head:  Normocephalic and atraumatic. Eyes:  Conjuctiva clear without scleral icterus. Abdomen:  +BS, soft, and non-distended. Mild TTP in RLQ.  No rebound or guarding. No HSM or masses noted. Msk:  Symmetrical without gross deformities. Normal posture. Extremities:  Without edema. Neurologic:  Alert and  oriented x4 Psych:  Normal mood and affect.    Assessment:  77 y.o. female with history of gastric bypass in 2007, IDA, anastomotic ulcer in 2016 and March 2021, GERD, chronic N/V, chronic constipation, adenomatous colon polyps due for surveillance colonoscopy in April 2025, chronic right sided abdominal pain with known right lateral abdominal wall hernia and right sided spigelian hernia, presenting today for follow-up.   GERD:  Not adequately controlled with pantoprazole 40 mg twice daily and dietary changes.  Previously failed Prevacid.  Recent EGD November 2024 with normal esophagus, abnormal residual gastric mucosa biopsied with pathology showing gastric oxyntic mucosa with changes consistent with PPI therapy, negative for H. pylori.  At this point, we will try changing pantoprazole to Aciphex 20 mg twice daily.  Chronic constipation:  Doing well with MiraLAX 17 g twice daily.  Chronic nausea/vomiting:  Much improved with Zofran 4 mg daily. Prior evaluation unrevealing including gastric emptying study in 2023 with normal exam, prior CTs without acute abnormalities, recent EGD November 2024 also unrevealing.  Recommend continuing Zofran.  Dysphagia:  Resolved following empiric esophageal dilation  November 2024.  Right sided abdominal pain:  Chronic right-sided abdominal pain thought to be secondary to known right-sided abdominal hernia and spigelian hernia.  Could also have adhesive disease with multiple prior abdominal surgeries including cholecystectomy, appendectomy, gastric bypass, C-section.  Uncontrolled GERD may also be contributing to her symptoms.  She did see [facility] in July 2022 who did not recommend surgery at that time, but could consider in the future if needed.  No other significant findings on CT scans.  EGD recently updated November 2024 as detailed above.  She reports ongoing right sided abdominal pain, but unchanged over the years and does not seem to be affecting her daily activities or oral intake.  At this point, I have recommended that we continue to monitor, and gain better control of GERD.   Plan:  Stop pantoprazole. Start Aciphex 20 mg twice daily 30 minutes before breakfast and 30 minutes before dinner. Continue Zofran 4 mg daily.  May take up to every 8 hours as needed. Continue MiraLAX 17 g twice daily. Follow-up with Dr. Jena Gauss in 3 months.   Ermalinda Memos, PA-C [facility] 01/06/2023

## 2023-01-04 NOTE — Telephone Encounter (Signed)
[employer]/tricare NPR sent to [facility] [phone]

## 2023-01-04 NOTE — Telephone Encounter (Signed)
 LVM for pt to call back to discuss labwork results

## 2023-01-04 NOTE — Telephone Encounter (Signed)
-----   Message from Glencoe Dohmeier sent at 01/02/2023  3:27 PM EST ----- Biomarker evidence of degenerative brain disease other than alzheimer's dementia.  This can be related to a broad spectrum of other conditions affecting brain health- vasculitis-inflammatory- toxic-metabolic - latent infection or deficiencies.   Awaiting MRI brain for differentiation.   Referral to neuropsychology for additional differentiation from psychiatric conditions is needed.

## 2023-01-06 ENCOUNTER — Telehealth: Payer: Self-pay | Admitting: Neurology

## 2023-01-06 ENCOUNTER — Ambulatory Visit: Payer: PPO | Admitting: Gastroenterology

## 2023-01-06 ENCOUNTER — Encounter: Payer: Self-pay | Admitting: Gastroenterology

## 2023-01-06 VITALS — BP 113/79 | HR 79 | Temp 98.0°F | Ht 65.0 in | Wt 168.0 lb

## 2023-01-06 DIAGNOSIS — K219 Gastro-esophageal reflux disease without esophagitis: Secondary | ICD-10-CM | POA: Diagnosis not present

## 2023-01-06 DIAGNOSIS — K439 Ventral hernia without obstruction or gangrene: Secondary | ICD-10-CM

## 2023-01-06 DIAGNOSIS — K5909 Other constipation: Secondary | ICD-10-CM | POA: Diagnosis not present

## 2023-01-06 DIAGNOSIS — R109 Unspecified abdominal pain: Secondary | ICD-10-CM

## 2023-01-06 DIAGNOSIS — R1310 Dysphagia, unspecified: Secondary | ICD-10-CM

## 2023-01-06 DIAGNOSIS — K5900 Constipation, unspecified: Secondary | ICD-10-CM

## 2023-01-06 DIAGNOSIS — R112 Nausea with vomiting, unspecified: Secondary | ICD-10-CM

## 2023-01-06 MED ORDER — ONDANSETRON HCL 4 MG PO TABS: 4.0000 mg | ORAL_TABLET | Freq: Three times a day (TID) | ORAL | 3 refills | Status: DC | PRN

## 2023-01-06 MED ORDER — RABEPRAZOLE SODIUM 20 MG PO TBEC: 20.0000 mg | DELAYED_RELEASE_TABLET | Freq: Two times a day (BID) | ORAL | 3 refills | Status: DC

## 2023-01-06 NOTE — Patient Instructions (Signed)
Stop pantoprazole.  Start Aciphex 20 mg twice daily 30 minutes before breakfast and 30 minutes before dinner.  Continue Zofran as needed for nausea and vomiting.  You can continue to take this once a day every day as this is working well for you.  Continue MiraLAX twice daily for constipation.  We will have you come back in 3 months to see Dr. Jena Gauss for follow-up.  Ermalinda Memos, PA-C [facility]

## 2023-01-06 NOTE — Telephone Encounter (Signed)
Called the patient back. The number on file is her daughter who is listed as point of contact. I was able to review the lab work with her. Pt has MRI ordered and refer to neuro psych. Pt will be on lookout for a call to get scheduled with those.

## 2023-01-06 NOTE — Telephone Encounter (Signed)
Returned phone call, would like a call back. °

## 2023-01-06 NOTE — Telephone Encounter (Signed)
-----   Message from Glencoe Dohmeier sent at 01/02/2023  3:27 PM EST ----- Biomarker evidence of degenerative brain disease other than alzheimer's dementia.  This can be related to a broad spectrum of other conditions affecting brain health- vasculitis-inflammatory- toxic-metabolic - latent infection or deficiencies.   Awaiting MRI brain for differentiation.   Referral to neuropsychology for additional differentiation from psychiatric conditions is needed.

## 2023-01-06 NOTE — Telephone Encounter (Signed)
Called patient to discuss sleep study results. No answer at this time. LVM for the patient to call back.   

## 2023-01-07 ENCOUNTER — Ambulatory Visit: Payer: PPO | Admitting: Family Medicine

## 2023-01-21 ENCOUNTER — Other Ambulatory Visit: Payer: Self-pay | Admitting: Neurology

## 2023-01-24 ENCOUNTER — Ambulatory Visit: Payer: PPO | Admitting: Family Medicine

## 2023-01-27 NOTE — Telephone Encounter (Signed)
 Rx refilled for 90 day supply. Refill ok per last office visit note.

## 2023-02-23 NOTE — Patient Instructions (Signed)

## 2023-02-23 NOTE — Progress Notes (Unsigned)
   Established Patient Office Visit   Subjective  Patient ID: LENOLA LOCKNER, female    DOB: 1945/11/28  Age: 78 y.o. MRN: [id number]  No chief complaint on file.   She  has a past medical history of Anemia, Anxiety, Arthritis, Chronic back pain, Depression, GERD (gastroesophageal reflux disease), Headache(784.0), Hiatal hernia, History of blood transfusion, History of GI bleed, Hypothyroidism, Mitral regurgitation, Obsessive-compulsive disorder, Persistent atrial fibrillation (HCC), Presence of Watchman left atrial appendage closure device (12/03/2021), Recurrent upper respiratory infection (URI), and Sleep apnea.  HPI  ROS    Objective:     There were no vitals taken for this visit. {Vitals History (Optional):23777}  Physical Exam   No results found for any visits on 02/25/23.  The 10-year ASCVD risk score (Arnett DK, et al., 2019) is: 19%    Assessment & Plan:  There are no diagnoses linked to this encounter.  No follow-ups on file.   Hilario Kidd Wilhelmena Falter, FNP

## 2023-02-25 ENCOUNTER — Ambulatory Visit: Payer: Self-pay | Admitting: Family Medicine

## 2023-02-25 DIAGNOSIS — R7303 Prediabetes: Secondary | ICD-10-CM

## 2023-02-25 DIAGNOSIS — I10 Essential (primary) hypertension: Secondary | ICD-10-CM

## 2023-02-25 DIAGNOSIS — E038 Other specified hypothyroidism: Secondary | ICD-10-CM

## 2023-03-03 ENCOUNTER — Encounter: Payer: Self-pay | Admitting: Internal Medicine

## 2023-03-16 ENCOUNTER — Encounter (HOSPITAL_COMMUNITY): Payer: Self-pay | Admitting: Hematology & Oncology

## 2023-03-25 ENCOUNTER — Encounter: Payer: Self-pay | Admitting: *Deleted

## 2023-04-01 ENCOUNTER — Other Ambulatory Visit: Payer: Self-pay | Admitting: Family Medicine

## 2023-04-06 ENCOUNTER — Other Ambulatory Visit: Payer: Self-pay | Admitting: Gastroenterology

## 2023-04-06 DIAGNOSIS — K219 Gastro-esophageal reflux disease without esophagitis: Secondary | ICD-10-CM

## 2023-04-08 ENCOUNTER — Other Ambulatory Visit (HOSPITAL_COMMUNITY): Payer: Self-pay

## 2023-04-08 ENCOUNTER — Encounter (HOSPITAL_COMMUNITY): Payer: Self-pay | Admitting: Hematology & Oncology

## 2023-04-08 ENCOUNTER — Other Ambulatory Visit: Payer: Self-pay | Admitting: Family Medicine

## 2023-04-08 ENCOUNTER — Telehealth: Payer: Self-pay | Admitting: Pharmacy Technician

## 2023-04-08 NOTE — Telephone Encounter (Signed)
 Pharmacy Patient Advocate Encounter   Received notification from CoverMyMeds that prior authorization for LIDOCAINE 5% PATCH is required/requested.   Insurance verification completed.   The patient is insured through [facility] .   Per test claim: PA required; PA submitted to above mentioned insurance via CoverMyMeds Key/confirmation #/EOC [id number] Status is pending

## 2023-04-08 NOTE — Telephone Encounter (Signed)
 Pharmacy Patient Advocate Encounter  Received notification from [facility] that Prior Authorization for Lidocaine 5% patches has been APPROVED from 04/08/2023 to 04/07/2024. Ran test claim, Copay is $30.95. This test claim was processed through [facility] copay amounts may vary at other pharmacies due to pharmacy/plan contracts, or as the patient moves through the different stages of their insurance plan.   PA #/Case ID/Reference #: [id number]

## 2023-05-12 ENCOUNTER — Encounter: Payer: Self-pay | Admitting: Family Medicine

## 2023-05-12 ENCOUNTER — Ambulatory Visit: Payer: Self-pay | Admitting: Family Medicine

## 2023-05-12 ENCOUNTER — Encounter (HOSPITAL_COMMUNITY): Payer: Self-pay | Admitting: Hematology & Oncology

## 2023-05-12 VITALS — BP 116/77 | HR 71 | Ht 65.0 in | Wt 161.0 lb

## 2023-05-12 DIAGNOSIS — R300 Dysuria: Secondary | ICD-10-CM

## 2023-05-12 DIAGNOSIS — E2839 Other primary ovarian failure: Secondary | ICD-10-CM | POA: Diagnosis not present

## 2023-05-12 DIAGNOSIS — E038 Other specified hypothyroidism: Secondary | ICD-10-CM | POA: Diagnosis not present

## 2023-05-12 DIAGNOSIS — R7303 Prediabetes: Secondary | ICD-10-CM | POA: Insufficient documentation

## 2023-05-12 DIAGNOSIS — I10 Essential (primary) hypertension: Secondary | ICD-10-CM

## 2023-05-12 DIAGNOSIS — R399 Unspecified symptoms and signs involving the genitourinary system: Secondary | ICD-10-CM

## 2023-05-12 DIAGNOSIS — E559 Vitamin D deficiency, unspecified: Secondary | ICD-10-CM

## 2023-05-12 MED ORDER — OXYBUTYNIN CHLORIDE ER 15 MG PO TB24: 15.0000 mg | ORAL_TABLET | Freq: Every day | ORAL | 1 refills | Status: DC

## 2023-05-12 MED ORDER — VITAMIN D (ERGOCALCIFEROL) 1.25 MG (50000 UNIT) PO CAPS: 50000.0000 [IU] | ORAL_CAPSULE | ORAL | 1 refills | Status: DC

## 2023-05-12 MED ORDER — DONEPEZIL HCL 5 MG PO TABS: 5.0000 mg | ORAL_TABLET | Freq: Every day | ORAL | 1 refills | Status: DC

## 2023-05-12 MED ORDER — LIDOCAINE 5 % EX PTCH: 1.0000 | MEDICATED_PATCH | CUTANEOUS | 0 refills | Status: DC

## 2023-05-12 NOTE — Assessment & Plan Note (Signed)
 Last Hemoglobin A1c: 6.0 Labs: Ordered today, results pending; will follow up accordingly. Reviewed non-pharmacological interventions, including a balanced diet rich in lean proteins, healthy fats, whole grains, and high-fiber vegetables. Emphasized reducing refined sugars and processed carbohydrates, and incorporating more fruits, leafy greens, and legumes. Patient Understanding: The patient verbalized understanding of the care plan, and all questions were answered.

## 2023-05-12 NOTE — Assessment & Plan Note (Signed)
 Vitals:   05/12/23 1345  BP: 116/77   Controlled, continue diltiazem  240 mg once daily, and atenolol  50 mg once daily Labs ordered. Discussed with  patient to monitor their blood pressure regularly and maintain a heart-healthy diet rich in fruits, vegetables, whole grains, and low-fat dairy, while reducing sodium intake to less than 2,300 mg per day. Regular physical activity, such as 30 minutes of moderate exercise most days of the week, will help lower blood pressure and improve overall cardiovascular health. Avoiding smoking, limiting alcohol consumption, and managing stress. Take  prescribed medication, & take it as directed and avoid skipping doses. Seek emergency care if your blood pressure is (over 180/100) or you experience chest pain, shortness of breath, or sudden vision changes.Patient verbalizes understanding regarding plan of care and all questions answered.

## 2023-05-12 NOTE — Assessment & Plan Note (Signed)
 Urinalysis, and urine culture ordered- Awaiting results will follow up.  Discussed managing a UTI at home, it's important to drink plenty of water  to help flush out bacteria from your urinary tract. Make sure to urinate frequently and avoid holding your urine. After using the bathroom, always wipe from front to back to prevent bacteria from spreading. Avoid irritants like caffeine, alcohol, spicy foods, and artificial sweeteners, as they can aggravate your bladder. Wearing loose, breathable clothing, especially cotton underwear, can help keep the area dry and reduce bacterial growth. If symptoms persist or worsen follow up.

## 2023-05-12 NOTE — Progress Notes (Signed)
 Established Patient Office Visit   Subjective  Patient ID: [name], female    DOB: 1945-03-03  Age: 78 y.o. MRN: [id number]  Chief Complaint  Patient presents with   Follow-up    Med review     She  has a past medical history of Anemia, Anxiety, Arthritis, Chronic back pain, Depression, GERD (gastroesophageal reflux disease), Headache(784.0), Hiatal hernia, History of blood transfusion, History of GI bleed, Hypothyroidism, Mitral regurgitation, Obsessive-compulsive disorder, Persistent atrial fibrillation (HCC), Presence of Watchman left atrial appendage closure device (12/03/2021), Recurrent upper respiratory infection (URI), and Sleep apnea.  HPI Patient presents to the clinic for chronic follow up. For the details of today's visit, please refer to assessment and plan.   Review of Systems  Constitutional:  Negative for chills and fever.  Respiratory:  Negative for shortness of breath.   Cardiovascular:  Negative for chest pain.  Genitourinary:  Positive for dysuria.  Neurological:  Negative for dizziness and headaches.      Objective:     BP 116/77   Pulse 71   Ht 5\' 5"  (1.651 m)   Wt 161 lb (73 kg)   SpO2 (!) 87%   BMI 26.79 kg/m  BP Readings from Last 3 Encounters:  05/12/23 116/77  01/06/23 113/79  12/29/22 (!) 156/86      Physical Exam Vitals reviewed.  Constitutional:      General: She is not in acute distress.    Appearance: Normal appearance. She is not ill-appearing, toxic-appearing or diaphoretic.  HENT:     Head: Normocephalic.     Right Ear: Tympanic membrane normal.     Left Ear: Tympanic membrane normal.     Mouth/Throat:     Pharynx: No posterior oropharyngeal erythema.  Eyes:     General:        Right eye: No discharge.        Left eye: No discharge.     Conjunctiva/sclera: Conjunctivae normal.     Pupils: Pupils are equal, round, and reactive to light.  Cardiovascular:     Rate and Rhythm: Normal rate.     Pulses: Normal pulses.      Heart sounds: Normal heart sounds.  Pulmonary:     Effort: Pulmonary effort is normal. No respiratory distress.     Breath sounds: Normal breath sounds.  Abdominal:     General: Bowel sounds are normal.     Palpations: Abdomen is soft.     Tenderness: There is no abdominal tenderness. There is no right CVA tenderness, left CVA tenderness or guarding.  Skin:    General: Skin is warm and dry.     Capillary Refill: Capillary refill takes less than 2 seconds.  Neurological:     Mental Status: She is alert.     Motor: Weakness present.     Coordination: Coordination abnormal.     Gait: Gait abnormal.  Psychiatric:        Mood and Affect: Mood normal.        Behavior: Behavior normal.      No results found for any visits on 05/12/23.  The 10-year ASCVD risk score (Arnett DK, et al., 2019) is: 19.9%    Assessment & Plan:  Prediabetes Assessment & Plan: Last Hemoglobin A1c: 6.0 Labs: Ordered today, results pending; will follow up accordingly. Reviewed non-pharmacological interventions, including a balanced diet rich in lean proteins, healthy fats, whole grains, and high-fiber vegetables. Emphasized reducing refined sugars and processed carbohydrates, and incorporating more fruits, leafy  greens, and legumes. Patient Understanding: The patient verbalized understanding of the care plan, and all questions were answered.    Orders: -     Hemoglobin A1c  Primary hypertension -     Lipid panel -     CMP14+EGFR -     CBC with Differential/Platelet  TSH (thyroid -stimulating hormone deficiency) -     TSH + free T4  Estrogen deficiency -     DG Bone Density; Future  Vitamin D  deficiency -     VITAMIN D  25 Hydroxy (Vit-D Deficiency, Fractures)  Urinary symptom or sign -     UA/M w/rflx Culture, Comp  Essential hypertension Assessment & Plan: Vitals:   05/12/23 1345  BP: 116/77   Controlled, continue diltiazem  240 mg once daily, and atenolol  50 mg once daily Labs  ordered. Discussed with  patient to monitor their blood pressure regularly and maintain a heart-healthy diet rich in fruits, vegetables, whole grains, and low-fat dairy, while reducing sodium intake to less than 2,300 mg per day. Regular physical activity, such as 30 minutes of moderate exercise most days of the week, will help lower blood pressure and improve overall cardiovascular health. Avoiding smoking, limiting alcohol consumption, and managing stress. Take  prescribed medication, & take it as directed and avoid skipping doses. Seek emergency care if your blood pressure is (over 180/100) or you experience chest pain, shortness of breath, or sudden vision changes.Patient verbalizes understanding regarding plan of care and all questions answered.    Other orders -     Donepezil  HCl; Take 1 tablet (5 mg total) by mouth at bedtime.  Dispense: 90 tablet; Refill: 1 -     Lidocaine ; Place 1 patch onto the skin daily. Remove & Discard patch within 12 hours or as directed by MD  Dispense: 30 patch; Refill: 0 -     oxyBUTYnin  Chloride ER; Take 1 tablet (15 mg total) by mouth at bedtime.  Dispense: 90 tablet; Refill: 1 -     Vitamin D  (Ergocalciferol ); Take 1 capsule (50,000 Units total) by mouth every Monday.  Dispense: 12 capsule; Refill: 1    Return in about 6 months (around 11/11/2023), or if symptoms worsen or fail to improve, for chronic follow-up.   Avelino Lek Amber Bail, FNP

## 2023-05-12 NOTE — Patient Instructions (Signed)

## 2023-05-13 ENCOUNTER — Encounter: Payer: Self-pay | Admitting: Family Medicine

## 2023-05-13 LAB — CMP14+EGFR
ALT: 8 IU/L (ref 0–32)
AST: 15 IU/L (ref 0–40)
Albumin: 4.1 g/dL (ref 3.8–4.8)
Alkaline Phosphatase: 68 IU/L (ref 44–121)
BUN/Creatinine Ratio: 17 (ref 12–28)
BUN: 26 mg/dL (ref 8–27)
Bilirubin Total: 0.6 mg/dL (ref 0.0–1.2)
CO2: 23 mmol/L (ref 20–29)
Calcium: 9.7 mg/dL (ref 8.7–10.3)
Chloride: 103 mmol/L (ref 96–106)
Creatinine, Ser: 1.5 mg/dL — ABNORMAL HIGH (ref 0.57–1.00)
Globulin, Total: 2.1 g/dL (ref 1.5–4.5)
Glucose: 109 mg/dL — ABNORMAL HIGH (ref 70–99)
Potassium: 5.1 mmol/L (ref 3.5–5.2)
Sodium: 139 mmol/L (ref 134–144)
Total Protein: 6.2 g/dL (ref 6.0–8.5)
eGFR: 36 mL/min/{1.73_m2} — ABNORMAL LOW (ref >59)

## 2023-05-13 LAB — CBC WITH DIFFERENTIAL/PLATELET
Basophils Absolute: 0.1 10*3/uL (ref 0.0–0.2)
Basos: 1 %
EOS (ABSOLUTE): 0.1 10*3/uL (ref 0.0–0.4)
Eos: 1 %
Hematocrit: 41.4 % (ref 34.0–46.6)
Hemoglobin: 13.2 g/dL (ref 11.1–15.9)
Immature Grans (Abs): 0 10*3/uL (ref 0.0–0.1)
Immature Granulocytes: 0 %
Lymphocytes Absolute: 1.8 10*3/uL (ref 0.7–3.1)
Lymphs: 25 %
MCH: 31.1 pg (ref 26.6–33.0)
MCHC: 31.9 g/dL (ref 31.5–35.7)
MCV: 98 fL — ABNORMAL HIGH (ref 79–97)
Monocytes Absolute: 0.6 10*3/uL (ref 0.1–0.9)
Monocytes: 9 %
Neutrophils Absolute: 4.7 10*3/uL (ref 1.4–7.0)
Neutrophils: 64 %
Platelets: 190 10*3/uL (ref 150–450)
RBC: 4.24 x10E6/uL (ref 3.77–5.28)
RDW: 12.1 % (ref 11.7–15.4)
WBC: 7.3 10*3/uL (ref 3.4–10.8)

## 2023-05-13 LAB — LIPID PANEL
Chol/HDL Ratio: 3 ratio (ref 0.0–4.4)
Cholesterol, Total: 210 mg/dL — ABNORMAL HIGH (ref 100–199)
HDL: 69 mg/dL (ref >39)
LDL Chol Calc (NIH): 119 mg/dL — ABNORMAL HIGH (ref 0–99)
Triglycerides: 125 mg/dL (ref 0–149)
VLDL Cholesterol Cal: 22 mg/dL (ref 5–40)

## 2023-05-13 LAB — HEMOGLOBIN A1C
Est. average glucose Bld gHb Est-mCnc: 117 mg/dL
Hgb A1c MFr Bld: 5.7 % — ABNORMAL HIGH (ref 4.8–5.6)

## 2023-05-13 LAB — TSH+FREE T4
Free T4: 1.44 ng/dL (ref 0.82–1.77)
TSH: 3.28 u[IU]/mL (ref 0.450–4.500)

## 2023-05-13 LAB — VITAMIN D 25 HYDROXY (VIT D DEFICIENCY, FRACTURES): Vit D, 25-Hydroxy: 82.1 ng/mL (ref 30.0–100.0)

## 2023-05-17 ENCOUNTER — Telehealth: Payer: Self-pay

## 2023-05-17 LAB — MICROSCOPIC EXAMINATION
Casts: NONE SEEN /LPF
Epithelial Cells (non renal): >10 /HPF — AB (ref 0–10)
RBC, Urine: NONE SEEN /HPF (ref 0–2)
WBC, UA: >30 /HPF — AB (ref 0–5)

## 2023-05-17 LAB — UA/M W/RFLX CULTURE, COMP
Bilirubin, UA: NEGATIVE
Glucose, UA: NEGATIVE
Ketones, UA: NEGATIVE
Nitrite, UA: NEGATIVE
RBC, UA: NEGATIVE
Specific Gravity, UA: 1.023 (ref 1.005–1.030)
Urobilinogen, Ur: 1 mg/dL (ref 0.2–1.0)
pH, UA: 5.5 (ref 5.0–7.5)

## 2023-05-17 LAB — URINE CULTURE, COMPREHENSIVE

## 2023-05-17 NOTE — Telephone Encounter (Signed)
 Copied from CRM [id number]. Topic: Clinical - Lab/Test Results >> May 17, 2023 12:44 PM Elle L wrote: Reason for CRM: The patient's daughter, Santo Cullen, is requesting a call back at [phone] regarding the patient's lab work from 4/24. I did advise her that it hasn't been reviewed at this time but she would like to speak to FNP John Muzzy Polanco directly if possible as she has questions regarding it. She is only available today and Friday.

## 2023-05-18 ENCOUNTER — Encounter: Payer: Self-pay | Admitting: Family Medicine

## 2023-05-18 ENCOUNTER — Other Ambulatory Visit: Payer: Self-pay | Admitting: Family Medicine

## 2023-05-18 DIAGNOSIS — N1832 Chronic kidney disease, stage 3b: Secondary | ICD-10-CM

## 2023-05-18 MED ORDER — ROSUVASTATIN CALCIUM 40 MG PO TABS: 40.0000 mg | ORAL_TABLET | Freq: Every day | ORAL | 3 refills | Status: DC

## 2023-05-20 ENCOUNTER — Other Ambulatory Visit: Payer: Self-pay | Admitting: Family Medicine

## 2023-05-20 NOTE — Telephone Encounter (Signed)
 Patient calling needs to speak to the provider Rogerio Clay about her lab results over the phone.  [phone]

## 2023-06-02 ENCOUNTER — Other Ambulatory Visit (HOSPITAL_COMMUNITY)

## 2023-06-07 ENCOUNTER — Telehealth: Payer: Self-pay | Admitting: Neurology

## 2023-06-07 ENCOUNTER — Ambulatory Visit: Payer: PPO | Admitting: Neurology

## 2023-06-07 NOTE — Telephone Encounter (Signed)
 Called the patient's daughter. LVM asking for them to call back. Upon reviewing the notes. Looks like the pt was suppose to complete MRI and neuro psych referral was sent. Wanted to see if the daughter wanted the apt for tomorrow or if she would like to push this out since the MRI hadn't been done yet, we could do that.

## 2023-06-08 ENCOUNTER — Telehealth: Payer: Self-pay | Admitting: Neurology

## 2023-06-08 ENCOUNTER — Telehealth: Payer: Self-pay

## 2023-06-08 ENCOUNTER — Other Ambulatory Visit: Payer: Self-pay | Admitting: Gastroenterology

## 2023-06-08 ENCOUNTER — Ambulatory Visit: Admitting: Neurology

## 2023-06-08 ENCOUNTER — Encounter (HOSPITAL_COMMUNITY): Payer: Self-pay | Admitting: Hematology & Oncology

## 2023-06-08 DIAGNOSIS — R112 Nausea with vomiting, unspecified: Secondary | ICD-10-CM

## 2023-06-08 MED ORDER — ONDANSETRON HCL 4 MG PO TABS: 4.0000 mg | ORAL_TABLET | Freq: Three times a day (TID) | ORAL | 3 refills | Status: DC | PRN

## 2023-06-08 NOTE — Telephone Encounter (Signed)
 Pt daughter and pt forgot about today's appointment, she has made request to r/s for today.  Daughter will take pt for MRI today

## 2023-06-08 NOTE — Telephone Encounter (Signed)
 [name]: [id number] exp. 06/08/23-07/09/23, [employer] no auth required

## 2023-06-08 NOTE — Telephone Encounter (Addendum)
 Daughter has called back to inform that [name] states another Authorization is needed for MRI for pt.this information has been relayed to MRI coordinator who states she will take care of this by the  end of the afternoon, this response was relayed to the daughter of pt.

## 2023-06-08 NOTE — Telephone Encounter (Signed)
 Pt is needing refills on ondansetron  4 mg. Pt was last seen on 01/06/2023.

## 2023-06-08 NOTE — Telephone Encounter (Signed)
 Rx sent.

## 2023-06-09 ENCOUNTER — Ambulatory Visit (HOSPITAL_COMMUNITY)
Admission: RE | Admit: 2023-06-09 | Discharge: 2023-06-09 | Disposition: A | Discharge status: Home / Self Care | Source: Ambulatory Visit | Attending: Family Medicine | Admitting: Family Medicine

## 2023-06-09 ENCOUNTER — Ambulatory Visit (HOSPITAL_COMMUNITY)
Admission: RE | Admit: 2023-06-09 | Discharge: 2023-06-09 | Disposition: A | Discharge status: Home / Self Care | Source: Ambulatory Visit | Attending: Neurology | Admitting: Neurology

## 2023-06-09 DIAGNOSIS — F02818 Dementia in other diseases classified elsewhere, unspecified severity, with other behavioral disturbance: Secondary | ICD-10-CM

## 2023-06-09 DIAGNOSIS — Z9884 Bariatric surgery status: Secondary | ICD-10-CM

## 2023-06-09 DIAGNOSIS — G309 Alzheimer's disease, unspecified: Secondary | ICD-10-CM | POA: Diagnosis present

## 2023-06-09 DIAGNOSIS — E2839 Other primary ovarian failure: Secondary | ICD-10-CM | POA: Insufficient documentation

## 2023-06-09 DIAGNOSIS — H833X3 Noise effects on inner ear, bilateral: Secondary | ICD-10-CM | POA: Insufficient documentation

## 2023-06-09 DIAGNOSIS — Z87820 Personal history of traumatic brain injury: Secondary | ICD-10-CM | POA: Insufficient documentation

## 2023-06-10 ENCOUNTER — Ambulatory Visit: Payer: Self-pay | Admitting: Family Medicine

## 2023-06-10 ENCOUNTER — Other Ambulatory Visit: Payer: Self-pay | Admitting: Family Medicine

## 2023-06-10 ENCOUNTER — Ambulatory Visit: Payer: Self-pay | Admitting: Neurology

## 2023-06-10 MED ORDER — ALENDRONATE SODIUM 70 MG PO TABS: 70.0000 mg | ORAL_TABLET | ORAL | 3 refills | Status: DC

## 2023-06-21 NOTE — Telephone Encounter (Signed)
-----   Message from Rankin Dohmeier sent at 06/10/2023 10:21 AM EDT ----- This MRI was ordered  12-29-2022, in our only visit  with the patient- her AD bio-markers were negative, ruling out Alzheimer's disease.   Result: There is brain atrophy present ( shrinking brain volume) .  Many chronic small infarcts and many micro- bleeds in the front and mid brain (left brain) and right cerebellum. Larger chronic stroke within the left frontal lobe is wedge shaped-  The stroke occurred some time after 2021 and was likely related to a fib with atrial appendage (treated 12-03-2021 by Dr Arlester Ladd) )  There was no large bleed.   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 (  this was the diagnosis suspected back in 2011 ) .  Plan :There is no specific treatment for vascular dementia. Improving hearing and visual acuity can improve cognition. Avoiding anticholinergic medications may prevent confusional spells.    General rules for the prevention of future strokes apply: Good vascular health ( Blood pressure, blood sugar and cholesterol control, a fib control).    Follow up with NP in our office can be offered prn, but is not needed.    The patient will return to primary care for management of general vascular health.

## 2023-06-21 NOTE — Telephone Encounter (Signed)
 Called the patient's number on file which is the daughter. LVM for a call back.

## 2023-06-22 NOTE — Telephone Encounter (Signed)
 Pt's daughter has returned call to RN, she's asking to be called at [phone]

## 2023-06-23 ENCOUNTER — Encounter: Payer: Self-pay | Admitting: Neurology

## 2023-06-30 ENCOUNTER — Ambulatory Visit (INDEPENDENT_AMBULATORY_CARE_PROVIDER_SITE_OTHER): Payer: PPO | Admitting: Otolaryngology

## 2023-08-15 ENCOUNTER — Other Ambulatory Visit: Payer: Self-pay | Admitting: Family Medicine

## 2023-08-15 DIAGNOSIS — K219 Gastro-esophageal reflux disease without esophagitis: Secondary | ICD-10-CM

## 2023-08-17 ENCOUNTER — Encounter: Payer: Self-pay | Admitting: Cardiology

## 2023-08-17 ENCOUNTER — Ambulatory Visit: Attending: Cardiology | Admitting: Cardiology

## 2023-08-17 VITALS — BP 114/75 | HR 67 | Ht 65.0 in | Wt 143.0 lb

## 2023-08-17 DIAGNOSIS — I10 Essential (primary) hypertension: Secondary | ICD-10-CM

## 2023-08-17 DIAGNOSIS — E782 Mixed hyperlipidemia: Secondary | ICD-10-CM | POA: Diagnosis not present

## 2023-08-17 DIAGNOSIS — I4819 Other persistent atrial fibrillation: Secondary | ICD-10-CM

## 2023-08-17 DIAGNOSIS — I2510 Atherosclerotic heart disease of native coronary artery without angina pectoris: Secondary | ICD-10-CM | POA: Diagnosis not present

## 2023-08-17 NOTE — Patient Instructions (Signed)
 Medication Instructions:  Your physician recommends that you continue on your current medications as directed. Please refer to the Current Medication list given to you today.   Labwork: None today  Testing/Procedures: None today  Follow-Up: 6 months  Any Other Special Instructions Will Be Listed Below (If Applicable).  If you need a refill on your cardiac medications before your next appointment, please call your pharmacy.

## 2023-08-17 NOTE — Progress Notes (Signed)
    Cardiology Office Note  Date: 08/17/2023   ID: Betzy, Barbier [birth year], MRN [id number]  History of Present Illness: [name] is a 78 y.o. female last seen in November 2024 by Ms. Strader PA-C, I reviewed her note (our last visit was in 2023).  She is here today with her daughter for a follow-up visit.  She reports no palpitations or chest discomfort.  Occasionally has a feeling of lightheadedness, but no falls or syncope.  She uses a cane to ambulate.  We went over her medications.  She was recently started on Crestor  40 mg daily by PCP, LDL was 119 in April.  I reviewed her echocardiogram from November 2024.  I reviewed her ECG today which shows rate controlled atrial fibrillation with leftward axis and nonspecific ST-T changes.  Physical Exam: VS:  BP 114/75 (BP Location: Left Arm, Patient Position: Sitting, Cuff Size: Normal)   Pulse 67   Ht 5' 5 (1.651 m)   Wt 143 lb (64.9 kg)   SpO2 97%   BMI 23.80 kg/m , BMI Body mass index is 23.8 kg/m.  Wt Readings from Last 3 Encounters:  08/17/23 143 lb (64.9 kg)  05/12/23 161 lb (73 kg)  01/06/23 168 lb (76.2 kg)    General: Patient appears comfortable at rest. HEENT: Conjunctiva and lids normal. Neck: Supple, no elevated JVP or carotid bruits. Lungs: Clear to auscultation, nonlabored breathing at rest. Cardiac: Irregularly irregular with 2/6 systolic murmur, no gallop. Extremities: Venous stasis changes distally.  ECG:  An ECG dated 10/16/2022 was personally reviewed today and demonstrated:  Atrial fibrillation with nonspecific ST changes.  Labwork: 05/12/2023: ALT 8; AST 15; BUN 26; Creatinine, Ser 1.50; Hemoglobin 13.2; Platelets 190; Potassium 5.1; Sodium 139; TSH 3.280     Component Value Date/Time   CHOL 210 (H) 05/12/2023 1502   TRIG 125 05/12/2023 1502   HDL 69 05/12/2023 1502   CHOLHDL 3.0 05/12/2023 1502   LDLCALC 119 (H) 05/12/2023 1502   Other Studies Reviewed Today:  No interval cardiac testing  for review today.  Assessment and Plan:  1.  Persistent atrial fibrillation with CHA2DS2-VASc score of 5 status post Watchman implantation in November 2023.  Heart rate controlled today on atenolol  50 mg daily and Cardizem  CD 240 mg daily.  She does not report any palpitations.  2.  Mitral regurgitation.  Echocardiogram in November 2024 revealed LVEF 50 to 55% with overall mild mitral regurgitation and moderate to severe mitral annular calcification.  3.  Primary hypertension.  Blood pressure well-controlled today on current regimen, no changes were made.  4.  Coronary artery calcification evident by CT imaging.  She is asymptomatic.  5.  Mixed hyperlipidemia, LDL 119 in April.  She was started on Crestor  40 mg daily by PCP.  Disposition:  Follow up 6 months.  Signed, Jayson JUDITHANN Sierras, M.D., F.A.C.C. [facility]

## 2023-08-18 ENCOUNTER — Encounter (HOSPITAL_COMMUNITY): Payer: Self-pay | Admitting: Hematology & Oncology

## 2023-08-18 ENCOUNTER — Other Ambulatory Visit: Payer: Self-pay | Admitting: Neurology

## 2023-08-18 ENCOUNTER — Ambulatory Visit: Admitting: Neurology

## 2023-08-18 ENCOUNTER — Encounter: Payer: Self-pay | Admitting: Neurology

## 2023-08-18 VITALS — BP 136/88 | HR 84 | Ht 64.0 in | Wt 154.4 lb

## 2023-08-18 DIAGNOSIS — F324 Major depressive disorder, single episode, in partial remission: Secondary | ICD-10-CM

## 2023-08-18 DIAGNOSIS — G959 Disease of spinal cord, unspecified: Secondary | ICD-10-CM

## 2023-08-18 DIAGNOSIS — R4189 Other symptoms and signs involving cognitive functions and awareness: Secondary | ICD-10-CM | POA: Diagnosis not present

## 2023-08-18 DIAGNOSIS — H9113 Presbycusis, bilateral: Secondary | ICD-10-CM | POA: Insufficient documentation

## 2023-08-18 DIAGNOSIS — Z87820 Personal history of traumatic brain injury: Secondary | ICD-10-CM | POA: Diagnosis not present

## 2023-08-18 DIAGNOSIS — Z9884 Bariatric surgery status: Secondary | ICD-10-CM | POA: Diagnosis not present

## 2023-08-18 DIAGNOSIS — Z95811 Presence of heart assist device: Secondary | ICD-10-CM

## 2023-08-18 DIAGNOSIS — N1831 Chronic kidney disease, stage 3a: Secondary | ICD-10-CM

## 2023-08-18 MED ORDER — SERTRALINE HCL 25 MG PO TABS: 25.0000 mg | ORAL_TABLET | Freq: Every day | ORAL | 5 refills | Status: DC

## 2023-08-18 MED ORDER — DONEPEZIL HCL 10 MG PO TABS: 10.0000 mg | ORAL_TABLET | Freq: Every day | ORAL | 6 refills | Status: DC

## 2023-08-18 NOTE — Patient Instructions (Signed)
 Problems With Thinking and Memory (Mild Neurocognitive Disorder): What to Know Mild neurocognitive disorder, formerly known as mild cognitive impairment, is a disorder where your memory doesn't work as well as it should. It may also affect other mental abilities like thinking, communicating, behavior, and being able to finish tasks. These problems can be noticed and measured. But they usually don't stop you from doing daily activities or living on your own. Mild neurocognitive disorder usually happens after 77 years of age. But it can also happen at younger ages. It's not as serious as major neurocognitive disorder, also known as dementia, but it may be the first sign of it. In general, the symptoms of this condition get worse over time. In rare cases, symptoms can get better. What are the causes? This condition may be caused by: Brain disorders like Alzheimer's disease, Parkinson's disease, and other conditions that slowly damage nerve cells. Diseases that affect the blood vessels in the brain and cause small strokes. Certain infections, like HIV. Traumatic brain injury. Other medical conditions, such as brain tumors, underactive thyroid (hypothyroidism), and not having enough vitamin B12. Using certain drugs or medicines. What increases the risk? Being older than 77 years of age. Being female. Having a lower level of education. Diabetes, high blood pressure, high cholesterol, and other conditions that raise the risk for blood vessel diseases. Untreated or undertreated sleep apnea. Having a certain type of gene that can be inherited, or passed down from parent to child. Long-term health problems like heart disease, lung disease, liver disease, kidney disease, or depression. What are the signs or symptoms? Trouble remembering things. You may: Forget names, phone numbers, or details of recent events. Forget about social events and appointments. Often forget where you put your car keys or other  items. Trouble thinking and solving problems. You may have trouble with complex tasks like: Paying bills. Driving in places you don't know well. Trouble communicating. You may have trouble: Finding the right word or naming an object. Forming a sentence that makes sense. Understanding what you read or hear. Changes in your behavior or personality. When this happens, you may: Lose interest in the things you used to enjoy. Avoid being around people. Get angry more easily than usual. Act before thinking. How is this diagnosed? This condition is diagnosed based on: Your symptoms. Your health care provider may ask you and the people you spend time with, like family and friends, about your symptoms. Memory tests and other tests to check how your brain is working. Your provider may refer you to a provider called a neurologist or a mental health specialist. To try to find out the cause of your condition, your provider may: Get a detailed medical history. Ask about use of alcohol, drugs, and medicines. Do a physical exam. Order blood tests and brain imaging tests. How is this treated? Mild neurocognitive disorder that's caused by medicine use, drug use, infection, or another medical condition may get better when the cause is treated, or when medicines or drugs are stopped. If this disorder has another cause, it usually doesn't improve and may get worse. In these cases, the goal of treatment is to help you manage the symptoms. This may include: Medicines to help with memory and behavior symptoms. Talk therapy. This provides education, emotional support, memory aids, and other ways of making up for problems with mental tasks. Lifestyle changes. These may include: Getting regular exercise. Eating a healthy diet that includes omega-3 fatty acids. Doing things to challenge your thinking  and memory skills. Spending more time being with and talking to other people. Using routines like having regular  times for meals and going to bed. Follow these instructions at home: Eating and drinking  Drink more fluids as told. Eat a healthy diet that includes omega-3 fatty acids. These can be found in: Fish. Nuts. Leafy vegetables. Vegetable oils. If you drink alcohol: Limit how much you have to: 0-1 drink a day if you're female. 0-2 drinks a day if you're female. Know how much alcohol is in your drink. In the [place] one drink is one 12 oz bottle of beer (355 mL), one 5 oz glass of wine (148 mL), or one 1 oz glass of hard liquor (44 mL). Lifestyle  Get regular exercise as told by your provider. Do not smoke, vape, or use nicotine or tobacco. Use healthy ways to manage stress. If you need help managing stress, ask your provider. Keep spending time with other people. Keep your mind active by doing activities you enjoy, like reading or playing games. Make sure you get good sleep at night. These tips can help: Try not to take naps during the day. Keep your bedroom dark and cool. Do not exercise in the few hours before you go to bed. Do not have foods or drinks with caffeine at night. General instructions Take medicines only as told. Your provider may tell you to avoid taking medicines that can affect thinking. These include some medicines for pain or sleeping. Work with your provider to find out: What things you need help with. What your safety needs are. Where to find more information [employer] on Aging: [employer] Contact a health care provider if: You have any new symptoms. Get help right away if: You have new confusion or your confusion gets worse. You act in ways that put you or your family in danger. This information is not intended to replace advice given to you by your health care provider. Make sure you discuss any questions you have with your health care provider. Document Revised: 06/29/2022 Document Reviewed: 06/29/2022 Elsevier Patient Education  2024 [employer].
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
## 2023-08-21 DIAGNOSIS — N1831 Chronic kidney disease, stage 3a: Secondary | ICD-10-CM | POA: Insufficient documentation

## 2023-08-21 DIAGNOSIS — G959 Disease of spinal cord, unspecified: Secondary | ICD-10-CM | POA: Insufficient documentation

## 2023-08-21 DIAGNOSIS — Z95811 Presence of heart assist device: Secondary | ICD-10-CM | POA: Insufficient documentation

## 2023-08-21 NOTE — Addendum Note (Signed)
 Addended by: CHALICE SAUNAS on: 08/21/2023 07:37 PM   Modules accepted: Orders

## 2023-08-22 ENCOUNTER — Other Ambulatory Visit: Payer: Self-pay | Admitting: Family Medicine

## 2023-08-23 ENCOUNTER — Telehealth: Payer: Self-pay | Admitting: *Deleted

## 2023-08-23 ENCOUNTER — Other Ambulatory Visit (HOSPITAL_COMMUNITY): Payer: Self-pay | Admitting: Nephrology

## 2023-08-23 DIAGNOSIS — N200 Calculus of kidney: Secondary | ICD-10-CM

## 2023-08-23 DIAGNOSIS — N1832 Chronic kidney disease, stage 3b: Secondary | ICD-10-CM | POA: Diagnosis not present

## 2023-08-23 DIAGNOSIS — I129 Hypertensive chronic kidney disease with stage 1 through stage 4 chronic kidney disease, or unspecified chronic kidney disease: Secondary | ICD-10-CM | POA: Diagnosis not present

## 2023-08-23 DIAGNOSIS — I4891 Unspecified atrial fibrillation: Secondary | ICD-10-CM | POA: Diagnosis not present

## 2023-08-23 NOTE — Telephone Encounter (Signed)
 Pt called and needs a refill on pantoprazole . Pt last OV 01/06/2023.

## 2023-08-23 NOTE — Telephone Encounter (Signed)
 Is she taking pantoprazole  or rabeprazole ? I changed pantoprazole  to rabeprazole  at her last OV. If she is taking pantoprazole  and not rabeprazole , please find out what dose and frequency she is taking it.

## 2023-08-24 NOTE — Telephone Encounter (Signed)
 LMOM for pt to call office

## 2023-08-31 ENCOUNTER — Ambulatory Visit: Payer: Self-pay

## 2023-08-31 ENCOUNTER — Other Ambulatory Visit: Payer: Self-pay | Admitting: Gastroenterology

## 2023-08-31 DIAGNOSIS — K219 Gastro-esophageal reflux disease without esophagitis: Secondary | ICD-10-CM

## 2023-08-31 MED ORDER — PANTOPRAZOLE SODIUM 40 MG PO TBEC: 40.0000 mg | DELAYED_RELEASE_TABLET | Freq: Two times a day (BID) | ORAL | 3 refills | Status: DC

## 2023-08-31 NOTE — Telephone Encounter (Signed)
 Patient scheduled.

## 2023-08-31 NOTE — Telephone Encounter (Signed)
 Spoke to pt's daughter (DPR) she informed me that pt has not been taking any iron or Pepto-Bismol recently. Informed her that prescription for pantoprazole  was sent to the pharmacy. And to make sure she keep OV with PCP tomorrow and with you on Monday.

## 2023-08-31 NOTE — Telephone Encounter (Signed)
 Spoke to pt, daughter Risk analyst) she stated that pt is taking pantoprazole . She stated that her mother said rabeprazole  hurt her stomach. She,also stated that she has been having black stools for the last couple of days. She stated that she started a new cholesterol and a bone density medication and she is not sure if this is causing her stools to be black. She also stated that her stomach has been hurting.

## 2023-08-31 NOTE — Telephone Encounter (Signed)
 FYI Only or Action Required?: FYI only for provider.  Patient was last seen in primary care on 05/12/2023 by Terry Wilhelmena Lloyd Hilario, FNP.  Called Nurse Triage reporting Stool Color Change.  Symptoms began 3 days ago.  Interventions attempted: Nothing.  Symptoms are: unchanged.  Triage Disposition: See PCP When Office is Open (Within 3 Days)  Patient/caregiver understands and will follow disposition?: Yes, will follow disposition  Copied from CRM #[id number]. Topic: Clinical - Red Word Triage >> Aug 31, 2023  8:54 AM Turkey B wrote: [facility] that prompted transfer to Nurse Triage: patients daughter called in, patient stool is dark Reason for Disposition  [1] Abnormal color is unexplained AND [2] persists > 24 hours  Answer Assessment - Initial Assessment Questions 1. COLOR: What color is your stool? Is that color in part or all of the stool? (e.g., black, clay-colored, green, red)      black 2. ONSET: When did you first notice this color change?     2 days ago 3. CAUSE: Have you eaten any food or taken any medicine of this color? Note: See listing in Background Information section.      Per daughter the pt has taken her third week of the bone density medication 4. OTHER SYMPTOMS: Do you have any other symptoms? (e.g., abdomen pain, diarrhea, fever, yellow eyes or skin).     Daughter states that pt is poor historian. Pt has hx of ulcer. Hx of gastric bypass.  Protocols used: Stools - Unusual Color-A-AH

## 2023-08-31 NOTE — Telephone Encounter (Signed)
 Noted. I will send in prescription for pantoprazole  40 mg twice daily with a meal.   Regarding her black stool, has she been taking any iron or Pepto-Bismol recently?  These medications can cause dark stools.  Do not think that her cholesterol medication or bone density medication would be the cause.   She would need to be evaluated in the office for the symptoms.  I see that she has contacted her primary care doctor and has an appointment tomorrow for evaluation of the symptoms.  She should keep that appointment.  I also recommend that we go ahead and get her scheduled for a follow-up ASAP.   Ladonna- please arrange appt asap for evaluation of black stool and abdominal pain.

## 2023-09-01 ENCOUNTER — Encounter (HOSPITAL_COMMUNITY): Payer: Self-pay | Admitting: Family Medicine

## 2023-09-01 ENCOUNTER — Emergency Department (HOSPITAL_COMMUNITY)

## 2023-09-01 ENCOUNTER — Ambulatory Visit: Payer: Self-pay | Admitting: Internal Medicine

## 2023-09-01 ENCOUNTER — Inpatient Hospital Stay (HOSPITAL_COMMUNITY)
Admission: EM | Admit: 2023-09-01 | Discharge: 2023-09-07 | DRG: 378 | Disposition: A | Discharge status: Skilled Nursing Facility | Attending: Internal Medicine | Admitting: Internal Medicine

## 2023-09-01 ENCOUNTER — Ambulatory Visit: Payer: Self-pay

## 2023-09-01 DIAGNOSIS — K254 Chronic or unspecified gastric ulcer with hemorrhage: Principal | ICD-10-CM | POA: Diagnosis present

## 2023-09-01 DIAGNOSIS — F0394 Unspecified dementia, unspecified severity, with anxiety: Secondary | ICD-10-CM | POA: Diagnosis not present

## 2023-09-01 DIAGNOSIS — Z860101 Personal history of adenomatous and serrated colon polyps: Secondary | ICD-10-CM

## 2023-09-01 DIAGNOSIS — R4189 Other symptoms and signs involving cognitive functions and awareness: Secondary | ICD-10-CM | POA: Diagnosis present

## 2023-09-01 DIAGNOSIS — Y848 Other medical procedures as the cause of abnormal reaction of the patient, or of later complication, without mention of misadventure at the time of the procedure: Secondary | ICD-10-CM | POA: Diagnosis present

## 2023-09-01 DIAGNOSIS — F0393 Unspecified dementia, unspecified severity, with mood disturbance: Secondary | ICD-10-CM | POA: Diagnosis not present

## 2023-09-01 DIAGNOSIS — R1013 Epigastric pain: Secondary | ICD-10-CM | POA: Diagnosis not present

## 2023-09-01 DIAGNOSIS — E785 Hyperlipidemia, unspecified: Secondary | ICD-10-CM | POA: Diagnosis not present

## 2023-09-01 DIAGNOSIS — Z885 Allergy status to narcotic agent status: Secondary | ICD-10-CM | POA: Diagnosis not present

## 2023-09-01 DIAGNOSIS — Z95818 Presence of other cardiac implants and grafts: Secondary | ICD-10-CM

## 2023-09-01 DIAGNOSIS — K283 Acute gastrojejunal ulcer without hemorrhage or perforation: Secondary | ICD-10-CM

## 2023-09-01 DIAGNOSIS — F32A Depression, unspecified: Secondary | ICD-10-CM | POA: Diagnosis present

## 2023-09-01 DIAGNOSIS — K9589 Other complications of other bariatric procedure: Principal | ICD-10-CM | POA: Diagnosis present

## 2023-09-01 DIAGNOSIS — E039 Hypothyroidism, unspecified: Secondary | ICD-10-CM | POA: Diagnosis present

## 2023-09-01 DIAGNOSIS — N1831 Chronic kidney disease, stage 3a: Secondary | ICD-10-CM | POA: Diagnosis present

## 2023-09-01 DIAGNOSIS — K250 Acute gastric ulcer with hemorrhage: Secondary | ICD-10-CM | POA: Diagnosis not present

## 2023-09-01 DIAGNOSIS — Z7983 Long term (current) use of bisphosphonates: Secondary | ICD-10-CM

## 2023-09-01 DIAGNOSIS — K289 Gastrojejunal ulcer, unspecified as acute or chronic, without hemorrhage or perforation: Secondary | ICD-10-CM | POA: Diagnosis not present

## 2023-09-01 DIAGNOSIS — I4819 Other persistent atrial fibrillation: Secondary | ICD-10-CM | POA: Diagnosis present

## 2023-09-01 DIAGNOSIS — I10 Essential (primary) hypertension: Secondary | ICD-10-CM | POA: Diagnosis not present

## 2023-09-01 DIAGNOSIS — Z981 Arthrodesis status: Secondary | ICD-10-CM | POA: Diagnosis not present

## 2023-09-01 DIAGNOSIS — Z886 Allergy status to analgesic agent status: Secondary | ICD-10-CM | POA: Diagnosis not present

## 2023-09-01 DIAGNOSIS — Z888 Allergy status to other drugs, medicaments and biological substances status: Secondary | ICD-10-CM | POA: Diagnosis not present

## 2023-09-01 DIAGNOSIS — F429 Obsessive-compulsive disorder, unspecified: Secondary | ICD-10-CM | POA: Diagnosis present

## 2023-09-01 DIAGNOSIS — Z980 Intestinal bypass and anastomosis status: Secondary | ICD-10-CM | POA: Diagnosis not present

## 2023-09-01 DIAGNOSIS — F341 Dysthymic disorder: Secondary | ICD-10-CM | POA: Diagnosis present

## 2023-09-01 DIAGNOSIS — Z90710 Acquired absence of both cervix and uterus: Secondary | ICD-10-CM | POA: Diagnosis not present

## 2023-09-01 DIAGNOSIS — I129 Hypertensive chronic kidney disease with stage 1 through stage 4 chronic kidney disease, or unspecified chronic kidney disease: Secondary | ICD-10-CM | POA: Diagnosis not present

## 2023-09-01 DIAGNOSIS — Z9884 Bariatric surgery status: Secondary | ICD-10-CM | POA: Diagnosis not present

## 2023-09-01 DIAGNOSIS — K922 Gastrointestinal hemorrhage, unspecified: Principal | ICD-10-CM | POA: Diagnosis present

## 2023-09-01 DIAGNOSIS — Z79890 Hormone replacement therapy (postmenopausal): Secondary | ICD-10-CM

## 2023-09-01 DIAGNOSIS — Z818 Family history of other mental and behavioral disorders: Secondary | ICD-10-CM | POA: Diagnosis not present

## 2023-09-01 DIAGNOSIS — Z7401 Bed confinement status: Secondary | ICD-10-CM | POA: Diagnosis not present

## 2023-09-01 DIAGNOSIS — N3281 Overactive bladder: Secondary | ICD-10-CM | POA: Diagnosis not present

## 2023-09-01 DIAGNOSIS — I340 Nonrheumatic mitral (valve) insufficiency: Secondary | ICD-10-CM | POA: Diagnosis present

## 2023-09-01 DIAGNOSIS — K219 Gastro-esophageal reflux disease without esophagitis: Secondary | ICD-10-CM

## 2023-09-01 DIAGNOSIS — Z79899 Other long term (current) drug therapy: Secondary | ICD-10-CM | POA: Diagnosis not present

## 2023-09-01 DIAGNOSIS — I4891 Unspecified atrial fibrillation: Secondary | ICD-10-CM | POA: Diagnosis not present

## 2023-09-01 DIAGNOSIS — D62 Acute posthemorrhagic anemia: Secondary | ICD-10-CM | POA: Diagnosis not present

## 2023-09-01 DIAGNOSIS — Z803 Family history of malignant neoplasm of breast: Secondary | ICD-10-CM | POA: Diagnosis not present

## 2023-09-01 DIAGNOSIS — Z87891 Personal history of nicotine dependence: Secondary | ICD-10-CM | POA: Diagnosis not present

## 2023-09-01 DIAGNOSIS — M810 Age-related osteoporosis without current pathological fracture: Secondary | ICD-10-CM | POA: Diagnosis not present

## 2023-09-01 DIAGNOSIS — R52 Pain, unspecified: Secondary | ICD-10-CM | POA: Diagnosis not present

## 2023-09-01 DIAGNOSIS — F419 Anxiety disorder, unspecified: Secondary | ICD-10-CM | POA: Diagnosis not present

## 2023-09-01 DIAGNOSIS — Z8249 Family history of ischemic heart disease and other diseases of the circulatory system: Secondary | ICD-10-CM

## 2023-09-01 LAB — CBC
HCT: 24.5 % — ABNORMAL LOW (ref 36.0–46.0)
Hemoglobin: 8 g/dL — ABNORMAL LOW (ref 12.0–15.0)
MCH: 33.1 pg (ref 26.0–34.0)
MCHC: 32.7 g/dL (ref 30.0–36.0)
MCV: 101.2 fL — ABNORMAL HIGH (ref 80.0–100.0)
Platelets: 135 K/uL — ABNORMAL LOW (ref 150–400)
RBC: 2.42 MIL/uL — ABNORMAL LOW (ref 3.87–5.11)
RDW: 13 % (ref 11.5–15.5)
WBC: 9.3 K/uL (ref 4.0–10.5)
nRBC: 0 % (ref 0.0–0.2)

## 2023-09-01 LAB — COMPREHENSIVE METABOLIC PANEL WITH GFR
ALT: 17 U/L (ref 0–44)
AST: 18 U/L (ref 15–41)
Albumin: 2.9 g/dL — ABNORMAL LOW (ref 3.5–5.0)
Alkaline Phosphatase: 44 U/L (ref 38–126)
Anion gap: 10 (ref 5–15)
BUN: 51 mg/dL — ABNORMAL HIGH (ref 8–23)
CO2: 26 mmol/L (ref 22–32)
Calcium: 8.5 mg/dL — ABNORMAL LOW (ref 8.9–10.3)
Chloride: 101 mmol/L (ref 98–111)
Creatinine, Ser: 1.26 mg/dL — ABNORMAL HIGH (ref 0.44–1.00)
GFR, Estimated: 44 mL/min — ABNORMAL LOW (ref >60)
Glucose, Bld: 121 mg/dL — ABNORMAL HIGH (ref 70–99)
Potassium: 4.1 mmol/L (ref 3.5–5.1)
Sodium: 137 mmol/L (ref 135–145)
Total Bilirubin: 0.6 mg/dL (ref 0.0–1.2)
Total Protein: 5.8 g/dL — ABNORMAL LOW (ref 6.5–8.1)

## 2023-09-01 LAB — URINALYSIS, ROUTINE W REFLEX MICROSCOPIC
Bacteria, UA: NONE SEEN
Bilirubin Urine: NEGATIVE
Glucose, UA: NEGATIVE mg/dL
Hgb urine dipstick: NEGATIVE
Ketones, ur: NEGATIVE mg/dL
Nitrite: NEGATIVE
Protein, ur: NEGATIVE mg/dL
Specific Gravity, Urine: 1.024 (ref 1.005–1.030)
pH: 6 (ref 5.0–8.0)

## 2023-09-01 LAB — CBC WITH DIFFERENTIAL/PLATELET
Abs Immature Granulocytes: 0.05 K/uL (ref 0.00–0.07)
Basophils Absolute: 0 K/uL (ref 0.0–0.1)
Basophils Relative: 0 %
Eosinophils Absolute: 0 K/uL (ref 0.0–0.5)
Eosinophils Relative: 0 %
HCT: 26.1 % — ABNORMAL LOW (ref 36.0–46.0)
Hemoglobin: 8.5 g/dL — ABNORMAL LOW (ref 12.0–15.0)
Immature Granulocytes: 1 %
Lymphocytes Relative: 11 %
Lymphs Abs: 1.1 K/uL (ref 0.7–4.0)
MCH: 32.9 pg (ref 26.0–34.0)
MCHC: 32.6 g/dL (ref 30.0–36.0)
MCV: 101.2 fL — ABNORMAL HIGH (ref 80.0–100.0)
Monocytes Absolute: 0.5 K/uL (ref 0.1–1.0)
Monocytes Relative: 5 %
Neutro Abs: 8.5 K/uL — ABNORMAL HIGH (ref 1.7–7.7)
Neutrophils Relative %: 83 %
Platelets: 141 K/uL — ABNORMAL LOW (ref 150–400)
RBC: 2.58 MIL/uL — ABNORMAL LOW (ref 3.87–5.11)
RDW: 12.9 % (ref 11.5–15.5)
WBC: 10.1 K/uL (ref 4.0–10.5)
nRBC: 0 % (ref 0.0–0.2)

## 2023-09-01 LAB — LIPASE, BLOOD: Lipase: 37 U/L (ref 11–51)

## 2023-09-01 MED ORDER — OXYCODONE HCL 5 MG PO TABS
2.5000 mg | ORAL_TABLET | ORAL | Status: DC | PRN
  Administered 2023-09-02 (×2): 5 mg via ORAL
  Filled 2023-09-01: qty 1

## 2023-09-01 MED ORDER — DILTIAZEM HCL ER COATED BEADS 240 MG PO CP24
240.0000 mg | ORAL_CAPSULE | Freq: Every day | ORAL | Status: DC
  Administered 2023-09-02 – 2023-09-05 (×4): 240 mg via ORAL
  Filled 2023-09-01 (×4): qty 1

## 2023-09-01 MED ORDER — PIPERACILLIN-TAZOBACTAM 3.375 G IVPB 30 MIN
3.3750 g | Freq: Once | INTRAVENOUS | Status: AC
  Administered 2023-09-01: 3.375 g via INTRAVENOUS
  Filled 2023-09-01: qty 50

## 2023-09-01 MED ORDER — SODIUM CHLORIDE 0.9% FLUSH
3.0000 mL | Freq: Two times a day (BID) | INTRAVENOUS | Status: DC
  Administered 2023-09-02 – 2023-09-06 (×9): 3 mL via INTRAVENOUS

## 2023-09-01 MED ORDER — LEVOTHYROXINE SODIUM 75 MCG PO TABS
75.0000 ug | ORAL_TABLET | Freq: Every day | ORAL | Status: DC
  Administered 2023-09-02 – 2023-09-06 (×5): 75 ug via ORAL
  Filled 2023-09-01 (×2): qty 1
  Filled 2023-09-01: qty 2
  Filled 2023-09-01 (×2): qty 1

## 2023-09-01 MED ORDER — ATENOLOL 25 MG PO TABS
50.0000 mg | ORAL_TABLET | Freq: Every day | ORAL | Status: DC
  Administered 2023-09-02 – 2023-09-03 (×2): 50 mg via ORAL
  Filled 2023-09-01 (×2): qty 2

## 2023-09-01 MED ORDER — SODIUM CHLORIDE 0.9 % IV SOLN: INTRAVENOUS | Status: AC

## 2023-09-01 MED ORDER — IOHEXOL 350 MG/ML SOLN
80.0000 mL | Freq: Once | INTRAVENOUS | Status: AC | PRN
  Administered 2023-09-01: 80 mL via INTRAVENOUS

## 2023-09-01 MED ORDER — VENLAFAXINE HCL 37.5 MG PO TABS
75.0000 mg | ORAL_TABLET | Freq: Two times a day (BID) | ORAL | Status: DC
  Administered 2023-09-01 – 2023-09-06 (×11): 75 mg via ORAL
  Filled 2023-09-01 (×9): qty 2
  Filled 2023-09-01: qty 1
  Filled 2023-09-01 (×3): qty 2

## 2023-09-01 MED ORDER — ROSUVASTATIN CALCIUM 20 MG PO TABS
40.0000 mg | ORAL_TABLET | Freq: Every day | ORAL | Status: DC
  Administered 2023-09-02 – 2023-09-06 (×5): 40 mg via ORAL
  Filled 2023-09-01 (×5): qty 2

## 2023-09-01 MED ORDER — PANTOPRAZOLE SODIUM 40 MG IV SOLR
40.0000 mg | Freq: Two times a day (BID) | INTRAVENOUS | Status: DC
  Administered 2023-09-02 – 2023-09-06 (×10): 40 mg via INTRAVENOUS
  Filled 2023-09-01 (×10): qty 10

## 2023-09-01 MED ORDER — ONDANSETRON HCL 4 MG/2ML IJ SOLN
4.0000 mg | Freq: Once | INTRAMUSCULAR | Status: AC
  Administered 2023-09-01: 4 mg via INTRAVENOUS
  Filled 2023-09-01: qty 2

## 2023-09-01 MED ORDER — PROCHLORPERAZINE EDISYLATE 10 MG/2ML IJ SOLN
5.0000 mg | Freq: Four times a day (QID) | INTRAMUSCULAR | Status: DC | PRN
  Administered 2023-09-02 – 2023-09-03 (×2): 5 mg via INTRAVENOUS
  Filled 2023-09-01 (×2): qty 2

## 2023-09-01 MED ORDER — DONEPEZIL HCL 5 MG PO TABS
10.0000 mg | ORAL_TABLET | Freq: Every day | ORAL | Status: DC
  Administered 2023-09-01 – 2023-09-06 (×6): 10 mg via ORAL
  Filled 2023-09-01 (×6): qty 2

## 2023-09-01 MED ORDER — SODIUM CHLORIDE 0.9 % IV BOLUS
1000.0000 mL | Freq: Once | INTRAVENOUS | Status: AC
  Administered 2023-09-01: 1000 mL via INTRAVENOUS

## 2023-09-01 MED ORDER — ACETAMINOPHEN 650 MG RE SUPP: 650.0000 mg | Freq: Four times a day (QID) | RECTAL | Status: DC | PRN

## 2023-09-01 MED ORDER — ACETAMINOPHEN 325 MG PO TABS
650.0000 mg | ORAL_TABLET | Freq: Four times a day (QID) | ORAL | Status: DC | PRN
  Administered 2023-09-03 – 2023-09-06 (×6): 650 mg via ORAL
  Filled 2023-09-01 (×7): qty 2

## 2023-09-01 MED ORDER — PANTOPRAZOLE SODIUM 40 MG IV SOLR
40.0000 mg | Freq: Once | INTRAVENOUS | Status: AC
  Administered 2023-09-01: 40 mg via INTRAVENOUS
  Filled 2023-09-01: qty 10

## 2023-09-01 MED ORDER — FENTANYL CITRATE (PF) 100 MCG/2ML IJ SOLN
12.5000 ug | INTRAMUSCULAR | Status: DC | PRN
  Administered 2023-09-01 – 2023-09-03 (×2): 50 ug via INTRAVENOUS
  Filled 2023-09-01 (×2): qty 2

## 2023-09-01 MED ORDER — OXYBUTYNIN CHLORIDE ER 5 MG PO TB24
15.0000 mg | ORAL_TABLET | Freq: Every day | ORAL | Status: DC
  Administered 2023-09-01 – 2023-09-06 (×6): 15 mg via ORAL
  Filled 2023-09-01 (×6): qty 3

## 2023-09-01 MED ORDER — PIPERACILLIN-TAZOBACTAM 3.375 G IVPB
3.3750 g | Freq: Three times a day (TID) | INTRAVENOUS | Status: DC
  Administered 2023-09-02 – 2023-09-06 (×15): 3.375 g via INTRAVENOUS
  Filled 2023-09-01 (×15): qty 50

## 2023-09-01 NOTE — Telephone Encounter (Signed)
 Called patient daughter, they are at ER, does her provider want to see her next Monday or Tuesday in office?

## 2023-09-01 NOTE — Telephone Encounter (Signed)
 Pt daughter calling  stating that she is wanting to cancel the appt for pt that is scheduled for today d/t taking pt to the ED today. Daughter states that pt has increased weakness, dizziness and SOB.   Pt daughter requesting to schedule hosp f/u, no appt availability in schedule. Please call daughter back to schedule f/u.    Copied from CRM [id number]. Topic: Clinical - Red Word Triage >> Sep 01, 2023 11:46 AM Wess RAMAN wrote: Red Word that prompted transfer to Nurse Triage: Dark stools, very weak and sick to stomach. Patient symptoms have worsened. Daughter, [name], would like to know if patient should be taken to the ER instead. She stated she her mother is unable to come to the office and sit for long periods of time.

## 2023-09-01 NOTE — Telephone Encounter (Signed)
 Her provider is not here Monday or Tuesday. Can be added on one of those days for ER follow up with Chelsa if anything available

## 2023-09-01 NOTE — ED Provider Notes (Signed)
 [place] EMERGENCY DEPARTMENT AT [facility] Provider Note   CSN: [id number] Arrival date & time: 09/01/23  1302     Patient presents with: Fatigue and Abdominal Pain   [name] is a 78 y.o. female.   Patient has had abdominal pain since Sunday and had some black stool Sunday.  She is complaining of abdominal pain now.  Patient had gastric bypass surgery years ago and history of ulcers  The history is provided by the patient and medical records. No language interpreter was used.  Abdominal Pain Pain location:  Epigastric Pain quality: aching   Pain radiates to:  Does not radiate Pain severity:  Moderate Onset quality:  Sudden Timing:  Constant Progression:  Worsening Chronicity:  New Context: not alcohol use   Relieved by:  Nothing Worsened by:  Nothing Associated symptoms: no chest pain, no cough, no diarrhea, no fatigue and no hematuria        Prior to Admission medications   Medication Sig Start Date End Date Taking? Authorizing Provider  alendronate  (FOSAMAX ) 70 MG tablet Take 1 tablet (70 mg total) by mouth every 7 (seven) days. Take with a full glass of water  on an empty stomach. 06/10/23   Del Wilhelmena Falter, Hilario, FNP  atenolol  (TENORMIN ) 50 MG tablet Take 1 tablet (50 mg total) by mouth daily. 12/03/22   Strader, Laymon HERO, PA-C  cyanocobalamin (VITAMIN B12) 1000 MCG/ML injection Inject 1,000 mcg into the muscle every 30 (thirty) days.    [provider]  diltiazem  (CARDIZEM  CD) 240 MG 24 hr capsule Take 1 capsule (240 mg total) by mouth daily. 09/09/22   Del Orbe Polanco, Iliana, FNP  donepezil  (ARICEPT ) 10 MG tablet Take 1 tablet (10 mg total) by mouth at bedtime. 08/18/23   Dohmeier, Dedra, MD  levothyroxine  (SYNTHROID , LEVOTHROID) 75 MCG tablet Take 75 mcg by mouth daily before breakfast.    [provider]  lidocaine  (LIDODERM ) 5 % PLACE 1 PATCH ONTO THE SKIN DAILY. REMOVE & DISCARD PATCH WITHIN 12 HOURS OR AS DIRECTED BY MD  08/23/23   Del Orbe Polanco, Iliana, FNP  ondansetron  (ZOFRAN ) 4 MG tablet Take 1 tablet (4 mg total) by mouth every 8 (eight) hours as needed for nausea or vomiting. 06/08/23   Rudy Josette RAMAN, PA-C  oxybutynin  (DITROPAN  XL) 15 MG 24 hr tablet Take 1 tablet (15 mg total) by mouth at bedtime. 05/12/23   Del Orbe Polanco, Iliana, FNP  pantoprazole  (PROTONIX ) 40 MG tablet Take 1 tablet (40 mg total) by mouth 2 (two) times daily. 08/31/23   Rudy Josette RAMAN, PA-C  penicillin v potassium (VEETID) 500 MG tablet Take 500 mg by mouth 3 (three) times daily. 07/13/23   [provider]  rosuvastatin  (CRESTOR ) 40 MG tablet Take 1 tablet (40 mg total) by mouth daily. 05/18/23   Del Orbe Polanco, Iliana, FNP  triamcinolone  cream (KENALOG ) 0.1 % Apply 1 Application topically 2 (two) times daily. 07/20/20   [provider]  venlafaxine  (EFFEXOR ) 75 MG tablet Take 75 mg by mouth 2 (two) times daily. 07/04/23   [provider]  Vitamin D , Ergocalciferol , (DRISDOL ) 1.25 MG (50000 UNIT) CAPS capsule Take 1 capsule (50,000 Units total) by mouth every Monday. 05/16/23   Del Wilhelmena Falter Hilario, FNP    Allergies: Benzodiazepines, Nsaids, Statins, Codeine, Escitalopram oxalate, Sertraline  hcl, and Tramadol hcl    Review of Systems  Constitutional:  Negative for appetite change and fatigue.  HENT:  Negative for congestion, ear discharge  and sinus pressure.   Eyes:  Negative for discharge.  Respiratory:  Negative for cough.   Cardiovascular:  Negative for chest pain.  Gastrointestinal:  Positive for abdominal pain. Negative for diarrhea.  Genitourinary:  Negative for frequency and hematuria.  Musculoskeletal:  Negative for back pain.  Skin:  Negative for rash.  Neurological:  Negative for seizures and headaches.  Psychiatric/Behavioral:  Negative for hallucinations.     Updated Vital Signs BP 132/78   Pulse 75   Temp 98.5 F (36.9 C) (Oral)   Resp 17   Ht 5' 4 (1.626 m)   Wt 68 kg    SpO2 94%   BMI 25.75 kg/m   Physical Exam Vitals and nursing note reviewed.  Constitutional:      Appearance: She is well-developed.  HENT:     Head: Normocephalic.  Eyes:     General: No scleral icterus.    Conjunctiva/sclera: Conjunctivae normal.  Neck:     Thyroid : No thyromegaly.  Cardiovascular:     Rate and Rhythm: Normal rate and regular rhythm.     Heart sounds: No murmur heard.    No friction rub. No gallop.  Pulmonary:     Breath sounds: No stridor. No wheezing or rales.  Chest:     Chest wall: No tenderness.  Abdominal:     General: There is no distension.     Tenderness: There is abdominal tenderness. There is no rebound.  Genitourinary:    Comments: Rectal exam shows brown stool that is heme positive Musculoskeletal:        General: Normal range of motion.     Cervical back: Neck supple.  Lymphadenopathy:     Cervical: No cervical adenopathy.  Skin:    Findings: No erythema or rash.  Neurological:     Mental Status: She is alert and oriented to person, place, and time.     Motor: No abnormal muscle tone.     Coordination: Coordination normal.  Psychiatric:        Behavior: Behavior normal.   CRITICAL CARE Performed by: Fairy Sermon Total critical care time: 35 minutes Critical care time was exclusive of separately billable procedures and treating other patients. Critical care was necessary to treat or prevent imminent or life-threatening deterioration. Critical care was time spent personally by me on the following activities: development of treatment plan with patient and/or surrogate as well as nursing, discussions with consultants, evaluation of patient's response to treatment, examination of patient, obtaining history from patient or surrogate, ordering and performing treatments and interventions, ordering and review of laboratory studies, ordering and review of radiographic studies, pulse oximetry and re-evaluation of patient's condition.   (all  labs ordered are listed, but only abnormal results are displayed) Labs Reviewed  CBC WITH DIFFERENTIAL/PLATELET  COMPREHENSIVE METABOLIC PANEL WITH GFR  LIPASE, BLOOD  POC OCCULT BLOOD, ED  TYPE AND SCREEN    EKG: None  Radiology: No results found.   Procedures   Medications Ordered in the ED  sodium chloride  0.9 % bolus 1,000 mL (has no administration in time range)  pantoprazole  (PROTONIX ) injection 40 mg (has no administration in time range)  ondansetron  (ZOFRAN ) injection 4 mg (has no administration in time range)   Patient with abdominal pain with history of black stools and heme positive stool now.  She will be treated with Protonix  and Zofran  and labs and CT scan are pending  Medical Decision Making Amount and/or Complexity of Data Reviewed Labs: ordered. Radiology: ordered.  Risk Prescription drug management. Decision regarding hospitalization.   Abdominal pain and history of black stools.  CT scan pending.  Dr. Cleotilde will disposition the patient     Final diagnoses:  None    ED Discharge Orders     None          Kendra Pac, MD 09/02/23 1502

## 2023-09-01 NOTE — H&P (Signed)
 History and Physical    [name] FMW:[id number] DOB: 1945-02-24 DOA: 09/01/2023  PCP: Terry Wilhelmena Lloyd Hilario, FNP   Patient coming from: Home   Chief Complaint: Dark stool, abdominal pain   HPI: [name] is a 78 y.o. female with medical history significant for atrial fibrillation status post Watchman device placement no longer anticoagulated, CKD 3A, dementia, depression, and anxiety who presents with melena and abdominal pain.  Patient reports 3 or 4 days of intermittent pain in the mid abdomen.  She had a very dark and tarry stool on 08/29/2023.  Patient reports that she has not had a bowel movement since then.  She denies nausea, vomiting, or diarrhea.  She has not noticed any subjective fever or chills.  ED Course: Upon arrival to the ED, patient is found to be afebrile and saturating well on room air with normal HR and stable BP.  Labs are most notable for BUN 51, creatinine 1.26, albumin 2.9, and hemoglobin 8.5.  GI (Dr. Cinderella) was consulted by the ED physician and the patient was given a liter of NS, IV Protonix , Zofran , and Zosyn .  Review of Systems:  All other systems reviewed and apart from HPI, are negative.  Past Medical History:  Diagnosis Date   Anemia    Anxiety    Arthritis    Chronic back pain    Spondylosis and stenosis   Depression    GERD (gastroesophageal reflux disease)    Headache(784.0)    Hiatal hernia    History of blood transfusion    History of GI bleed    Hypothyroidism    Mitral regurgitation    Obsessive-compulsive disorder    Persistent atrial fibrillation (HCC)    Presence of Watchman left atrial appendage closure device 12/03/2021   31mm Watchman FLX with Dr. Wonda   Recurrent upper respiratory infection (URI)    Sleep apnea     Past Surgical History:  Procedure Laterality Date   ABDOMINAL HYSTERECTOMY     ANTERIOR CERVICAL DECOMP/DISCECTOMY FUSION  12/09/2010   Procedure: ANTERIOR CERVICAL DECOMPRESSION/DISCECTOMY FUSION 3  LEVELS;  Surgeon: Reyes JONETTA Budge;  Location: [facility];  Service: Neurosurgery;  Laterality: N/A;  Cervical three-four,Cervical four-five,Cervical Five-Six,Cervical Six-Seven ANTERIOR CERVICAL DECOMPRESSION WITH FUSION INTERBODY PROTHESIS PLATING AND BONEGRAFT   APPENDECTOMY     BIOPSY N/A 11/14/2014   Procedure: GASTRIC BIOPSY;  Surgeon: Lamar CHRISTELLA Hollingshead, MD;  Location: [facility];  Service: Endoscopy;  Laterality: N/A;   CESAREAN SECTION     CHOLECYSTECTOMY     COLONOSCOPY WITH PROPOFOL  N/A 11/14/2014   RMR: redundant colon but otherwise normal   COLONOSCOPY WITH PROPOFOL  N/A 05/12/2020   Dr. Hollingshead;  Seven 4 to 7 mm polyps, diverticulosis in the sigmoid colon. Pathology with tubular adenomas. Recommended repeat in 3 years.   ESOPHAGOGASTRODUODENOSCOPY (EGD) WITH PROPOFOL  N/A 11/14/2014   RMRs/p gastric surgery/anastomtic ulcer   ESOPHAGOGASTRODUODENOSCOPY (EGD) WITH PROPOFOL  N/A 09/15/2017   Procedure: ESOPHAGOGASTRODUODENOSCOPY (EGD) WITH PROPOFOL ;  Surgeon: Hollingshead Lamar CHRISTELLA, MD;  Location: [facility];  Service: Endoscopy;  Laterality: N/A;  11:30am   ESOPHAGOGASTRODUODENOSCOPY (EGD) WITH PROPOFOL  N/A 04/04/2019   2 cm ulcer crater at anastomosis with smaller satellite areas of ulceration in setting of NSAIDs/Goody powders, no bleeding stigmata.   ESOPHAGOGASTRODUODENOSCOPY (EGD) WITH PROPOFOL  N/A 06/13/2019   Rourk: Prior gastric bypass surgery with Billroth II type anatomy, otherwise normal-appearing residual upper GI tract.   ESOPHAGOGASTRODUODENOSCOPY (EGD) WITH PROPOFOL  N/A 12/01/2022   Procedure: ESOPHAGOGASTRODUODENOSCOPY (EGD) WITH PROPOFOL ;  Surgeon: Shaaron Lamar HERO, MD;  Location: [facility];  Service: Endoscopy;  Laterality: N/A;  830am, asa 3   EYE SURGERY     bilateral cataracts removed, w/IOL   GASTRIC BYPASS  2008   JOINT REPLACEMENT     07/2010 &  2002- respectively- both knees    LEFT ATRIAL APPENDAGE OCCLUSION N/A 12/03/2021   Procedure: LEFT ATRIAL APPENDAGE  OCCLUSION;  Surgeon: Wonda Sharper, MD;  [facility] INVAS[facility] CV LAB;  Service: Cardiovascular;  Laterality: N/A;   MALONEY DILATION N/A 09/15/2017   Procedure: AGAPITO DILATION;  Surgeon: Shaaron Lamar HERO, MD;  Location: [facility];  Service: Endoscopy;  Laterality: N/A;   MALONEY DILATION N/A 12/01/2022   Procedure: AGAPITO DILATION;  Surgeon: Shaaron Lamar HERO, MD;  Location: [facility];  Service: Endoscopy;  Laterality: N/A;   PARATHYROIDECTOMY     PERIPHERALLY INSERTED CENTRAL CATHETER INSERTION     PICC line for treatment of MRSA   POLYPECTOMY  05/12/2020   Procedure: POLYPECTOMY;  Surgeon: Shaaron Lamar HERO, MD;  Location: [facility];  Service: Endoscopy;;   TEE WITHOUT CARDIOVERSION N/A 12/03/2021   Procedure: TRANSESOPHAGEAL ECHOCARDIOGRAM (TEE);  Surgeon: Wonda Sharper, MD;  Location: [facility] INVASIVE CV LAB;  Service: Cardiovascular;  Laterality: N/A;   TONSILLECTOMY     as a child    Social History:   reports that she quit smoking about 25 years ago. Her smoking use included cigarettes. She started smoking about 55 years ago. She has a 30 pack-year smoking history. She has never used smokeless tobacco. She reports that she does not drink alcohol and does not use drugs.  Allergies  Allergen Reactions   Benzodiazepines Other (See Comments)    Cloudy thinking, memory loss, withdrawal symptoms when trying to stop without any other medication for detox.    Nsaids Other (See Comments)    Bleeding ulcer    Statins Nausea And Vomiting   Codeine Nausea Only   Escitalopram Oxalate Nausea Only   Sertraline  Hcl Nausea Only   Tramadol Hcl Other (See Comments)    Daughter doesn't want mother to take due to the fact that the medication made the daughter have a seizure     Family History  Problem Relation Age of Onset   Anxiety disorder Mother    Breast cancer Mother    Heart attack Father    Anxiety disorder Sister    Breast cancer Sister    Bipolar disorder Daughter    OCD  Daughter    Anesthesia problems Neg Hx    Hypotension Neg Hx    Malignant hyperthermia Neg Hx    Pseudochol deficiency Neg Hx    Dementia Neg Hx    Alcohol abuse Neg Hx    Drug abuse Neg Hx    Depression Neg Hx    Paranoid behavior Neg Hx    Schizophrenia Neg Hx    Seizures Neg Hx    Sexual abuse Neg Hx    Physical abuse Neg Hx    Colon cancer Neg Hx      Prior to Admission medications   Medication Sig Start Date End Date Taking? Authorizing Provider  alendronate  (FOSAMAX ) 70 MG tablet Take 1 tablet (70 mg total) by mouth every 7 (seven) days. Take with a full glass of water  on an empty stomach. 06/10/23   Del Wilhelmena Falter, Hilario, FNP  atenolol  (TENORMIN ) 50 MG tablet Take 1 tablet (50 mg total) by mouth daily. 12/03/22   Johnson Grate  M, PA-C  cyanocobalamin (VITAMIN B12) 1000 MCG/ML injection Inject 1,000 mcg into the muscle every 30 (thirty) days.    [provider]  diltiazem  (CARDIZEM  CD) 240 MG 24 hr capsule Take 1 capsule (240 mg total) by mouth daily. 09/09/22   Del Wilhelmena Lloyd Sola, FNP  donepezil  (ARICEPT ) 10 MG tablet Take 1 tablet (10 mg total) by mouth at bedtime. 08/18/23   Dohmeier, Dedra, MD  levothyroxine  (SYNTHROID , LEVOTHROID) 75 MCG tablet Take 75 mcg by mouth daily before breakfast.    [provider]  lidocaine  (LIDODERM ) 5 % PLACE 1 PATCH ONTO THE SKIN DAILY. REMOVE & DISCARD PATCH WITHIN 12 HOURS OR AS DIRECTED BY MD 08/23/23   Del Wilhelmena Lloyd Sola, FNP  ondansetron  (ZOFRAN ) 4 MG tablet Take 1 tablet (4 mg total) by mouth every 8 (eight) hours as needed for nausea or vomiting. 06/08/23   Rudy Josette RAMAN, PA-C  oxybutynin  (DITROPAN  XL) 15 MG 24 hr tablet Take 1 tablet (15 mg total) by mouth at bedtime. 05/12/23   Del Orbe Polanco, Iliana, FNP  pantoprazole  (PROTONIX ) 40 MG tablet Take 1 tablet (40 mg total) by mouth 2 (two) times daily. 08/31/23   Rudy Josette RAMAN, PA-C  penicillin v potassium (VEETID) 500 MG tablet Take 500 mg by mouth  3 (three) times daily. 07/13/23   [provider]  rosuvastatin  (CRESTOR ) 40 MG tablet Take 1 tablet (40 mg total) by mouth daily. 05/18/23   Del Orbe Polanco, Iliana, FNP  triamcinolone  cream (KENALOG ) 0.1 % Apply 1 Application topically 2 (two) times daily. 07/20/20   [provider]  venlafaxine  (EFFEXOR ) 75 MG tablet Take 75 mg by mouth 2 (two) times daily. 07/04/23   [provider]  Vitamin D , Ergocalciferol , (DRISDOL ) 1.25 MG (50000 UNIT) CAPS capsule Take 1 capsule (50,000 Units total) by mouth every Monday. 05/16/23   Del Wilhelmena Lloyd Sola, FNP    Physical Exam: Vitals:   09/01/23 1500 09/01/23 1515 09/01/23 1700 09/01/23 2000  BP: 120/74 132/78 126/78 127/82  Pulse: 82 75 81 90  Resp: (!) 22 17 12 19   Temp:      TempSrc:      SpO2: (!) 80% 94% 95% 100%  Weight:      Height:        Constitutional: NAD, no diaphoresis   Eyes: PERTLA, lids and conjunctivae normal ENMT: Mucous membranes are moist. Posterior pharynx clear of any exudate or lesions.   Neck: supple, no masses  Respiratory: no wheezing, no crackles. No accessory muscle use.  Cardiovascular: S1 & S2 heard, regular rate and rhythm. No JVD. Abdomen: Soft, no guarding. Bowel sounds active.  Musculoskeletal: no clubbing / cyanosis. No joint deformity upper and lower extremities.   Skin: no significant rashes, lesions, ulcers. Warm, dry, well-perfused. Neurologic: CN 2-12 grossly intact. Moving all extremities. Alert and oriented to person and place.  Psychiatric: Calm. Cooperative.    Labs and Imaging on Admission: I have personally reviewed following labs and imaging studies  CBC: Recent Labs  Lab 09/01/23 1525  WBC 10.1  NEUTROABS 8.5*  HGB 8.5*  HCT 26.1*  MCV 101.2*  PLT 141*   Basic Metabolic Panel: Recent Labs  Lab 09/01/23 1525  NA 137  K 4.1  CL 101  CO2 26  GLUCOSE 121*  BUN 51*  CREATININE 1.26*  CALCIUM  8.5*   GFR: Estimated Creatinine Clearance: 36 mL/min  (A) (by C-G formula based on SCr of 1.26 mg/dL (H)). Liver Function Tests: Recent  Labs  Lab 09/01/23 1525  AST 18  ALT 17  ALKPHOS 44  BILITOT 0.6  PROT 5.8*  ALBUMIN 2.9*   Recent Labs  Lab 09/01/23 1525  LIPASE 37   No results for input(s): AMMONIA in the last 168 hours. Coagulation Profile: No results for input(s): INR, PROTIME in the last 168 hours. Cardiac Enzymes: No results for input(s): CKTOTAL, CKMB, CKMBINDEX, TROPONINI in the last 168 hours. BNP (last 3 results) No results for input(s): PROBNP in the last 8760 hours. HbA1C: No results for input(s): HGBA1C in the last 72 hours. CBG: No results for input(s): GLUCAP in the last 168 hours. Lipid Profile: No results for input(s): CHOL, HDL, LDLCALC, TRIG, CHOLHDL, LDLDIRECT in the last 72 hours. Thyroid  Function Tests: No results for input(s): TSH, T4TOTAL, FREET4, T3FREE, THYROIDAB in the last 72 hours. Anemia Panel: No results for input(s): VITAMINB12, FOLATE, FERRITIN, TIBC, IRON, RETICCTPCT in the last 72 hours. Urine analysis:    Component Value Date/Time   COLORURINE YELLOW 09/01/2023 1811   APPEARANCEUR CLEAR 09/01/2023 1811   APPEARANCEUR Cloudy (A) 05/12/2023 1505   LABSPEC 1.024 09/01/2023 1811   PHURINE 6.0 09/01/2023 1811   GLUCOSEU NEGATIVE 09/01/2023 1811   HGBUR NEGATIVE 09/01/2023 1811   BILIRUBINUR NEGATIVE 09/01/2023 1811   BILIRUBINUR Negative 05/12/2023 1505   KETONESUR NEGATIVE 09/01/2023 1811   PROTEINUR NEGATIVE 09/01/2023 1811   UROBILINOGEN 0.2 05/09/2012 1741   NITRITE NEGATIVE 09/01/2023 1811   LEUKOCYTESUR SMALL (A) 09/01/2023 1811   Sepsis Labs: @LABRCNTIP (procalcitonin:4,lacticidven:4) )No results found for this or any previous visit (from the past 240 hours).   Radiological Exams on Admission: CT ANGIO GI BLEED Result Date: 09/01/2023 CLINICAL DATA:  GI bleed EXAM: CTA ABDOMEN AND PELVIS WITHOUT AND WITH CONTRAST  TECHNIQUE: Multidetector CT imaging of the abdomen and pelvis was performed using the standard protocol during bolus administration of intravenous contrast. Multiplanar reconstructed images and MIPs were obtained and reviewed to evaluate the vascular anatomy. RADIATION DOSE REDUCTION: This exam was performed according to the departmental dose-optimization program which includes automated exposure control, adjustment of the mA and/or kV according to patient size and/or use of iterative reconstruction technique. CONTRAST:  80mL OMNIPAQUE  IOHEXOL  350 MG/ML SOLN COMPARISON:  CT abdomen pelvis October 16, 2022, and July 12, 2021. FINDINGS: VASCULAR Aorta: Normal caliber aorta without aneurysm, dissection, vasculitis or significant stenosis. Celiac: Patent without evidence of aneurysm, dissection, vasculitis or significant stenosis. SMA: Patent without evidence of aneurysm, dissection, vasculitis or significant stenosis. Renals: Both renal arteries are patent without evidence of aneurysm, dissection, vasculitis, fibromuscular dysplasia or significant stenosis. IMA: Patent without evidence of aneurysm, dissection, vasculitis or significant stenosis. Inflow: Patent without evidence of aneurysm, dissection, vasculitis or significant stenosis. Proximal Outflow: Bilateral common femoral and visualized portions of the superficial and profunda femoral arteries are patent without evidence of aneurysm, dissection, vasculitis or significant stenosis. Veins: No obvious venous abnormality within the limitations of this arterial phase study. Review of the MIP images confirms the above findings. NON-VASCULAR Lower chest: Cardiomegaly. Aortic annulus and coronary artery calcification. Partially visualized mitral leaflet calcifications. Hepatobiliary: Cholecystectomy. No biliary dilatation. Punctate right hepatic lobe calcification. Liver appears normal without focal enhancing lesion. Pancreas: Atrophic changes of the pancreas. Spleen:  Normal in size without focal abnormality. Punctate calcifications suggestive of prior granulomatous disease. Adrenals/Urinary Tract: Adrenal glands are unremarkable. Kidneys are normal, without renal calculi, focal lesion, or hydronephrosis. Bladder is unremarkable. Stomach/Bowel: Status post Roux-en-Y gastric bypass. There is omental/peritoneal fat stranding along the surgical anastomosis  involving the left upper quadrant (9/79-81). Multiple prominent and enhancing mesenteric lymph nodes are identified measuring up to 1.2 cm. (9/90, 88). These findings are similar to prior exam from July 12, 2021 and new to prior exam from 2024. No bowel obstruction or active site of bleeding. Large stool identified throughout the colon. Submucosal fat along the terminal ileum suggestive of chronic colitis. Appendectomy.  No fluid collection or free gas. Lymphatic: No significant vascular findings are present. No enlarged abdominal or pelvic lymph nodes. Reproductive: Hysterectomy.  No adnexal mass. Other: No abdominal wall hernia or abnormality. No abdominopelvic ascites. Musculoskeletal: Degenerative changes of the spine. Posterior spinal fusion device L4-L5 and inter spinous cage. IMPRESSION: VASCULAR Atherosclerotic calcifications, otherwise unremarkable. No definite finding to suggest active bleeding. NON-VASCULAR 1. Diffuse fat stranding in left upper quadrant around the site of gastrojejunostomy bypass anastomosis, and multiple mesenteric root enhancing lymph nodes/misty mesentery. No definite site of active bleeding, perianastomotic fluid collection or free air . Findings are likely suggestive of active infectious/inflammatory process and similar to prior exam from July 12, 2021. Recommend correlation with endoscopy and clinical findings. 2. Submucosal fat along the terminal ileum suggestive of chronic inflammatory changes. Electronically Signed   By: Megan  Zare M.D.   On: 09/01/2023 20:17    Assessment/Plan   1. GI  bleeding; acute blood-loss anemia  - Hgb is 8.5, down from 13.2 in April 2025; she is hemodynamically stable  - Continue IV PPI, bowel-rest, continue antibiotic for now given CT findings suggestive of possible infectious process, trend H&H, transfuse if needed  2. CKD 3A  - Renally-dose medications   3. Atrial fibrillation  - Not anticoagulated, had Watchman device placed in April 2023  - Continue atenolol  and diltiazem     4. Hypothyroidism  - Synthroid    5. Dementia  - Aricept , delirium precautions    6. Depression, anxiety  - Effexor     DVT prophylaxis: SCDs  Code Status: Full  Level of Care: Level of care: Telemetry Family Communication: Daughter updated from ED  Disposition Plan:  Patient is from: Home  Anticipated d/c is to: TBD Anticipated d/c date is: 09/04/23  Patient currently: Pending stable H&H, GI consultation  Consults called: GI  Admission status: Inpatient     Evalene GORMAN Sprinkles, MD [facility]  09/01/2023, 10:42 PM

## 2023-09-01 NOTE — ED Provider Notes (Signed)
 At change of shift care signed out from off going physician.  Patient with new onset anemia, likely has some gastrointestinal bleeding.  Hemoccult positive.  CT angiogram read as inflammatory changes at the anastomosis site of the bowel after gastric bypass.  I discussed the care with gastroenterology Dr. Cinderella who will see the patient tomorrow for likely endoscopy.  Discussed with Dr. Charlton who will admit the patient.   Cleotilde Rogue, MD 09/01/23 2141

## 2023-09-01 NOTE — ED Triage Notes (Signed)
 Patient present to ED via POV, Patient alert  and oriented x 2.   Daughter reported she had  dark tarry stools on Monday. Reports lower abdominal pain started couple of days ago.

## 2023-09-01 NOTE — Telephone Encounter (Signed)
 scheduled

## 2023-09-02 ENCOUNTER — Encounter (HOSPITAL_COMMUNITY)
Admission: EM | Disposition: A | Discharge status: Skilled Nursing Facility | Payer: Self-pay | Source: Home / Self Care | Attending: Internal Medicine

## 2023-09-02 ENCOUNTER — Encounter (HOSPITAL_COMMUNITY): Payer: Self-pay | Admitting: Family Medicine

## 2023-09-02 ENCOUNTER — Inpatient Hospital Stay (HOSPITAL_COMMUNITY): Admitting: Anesthesiology

## 2023-09-02 DIAGNOSIS — I4891 Unspecified atrial fibrillation: Secondary | ICD-10-CM | POA: Diagnosis not present

## 2023-09-02 DIAGNOSIS — K289 Gastrojejunal ulcer, unspecified as acute or chronic, without hemorrhage or perforation: Secondary | ICD-10-CM | POA: Diagnosis not present

## 2023-09-02 DIAGNOSIS — K254 Chronic or unspecified gastric ulcer with hemorrhage: Secondary | ICD-10-CM

## 2023-09-02 DIAGNOSIS — I10 Essential (primary) hypertension: Secondary | ICD-10-CM | POA: Diagnosis not present

## 2023-09-02 DIAGNOSIS — Z87891 Personal history of nicotine dependence: Secondary | ICD-10-CM | POA: Diagnosis not present

## 2023-09-02 DIAGNOSIS — K922 Gastrointestinal hemorrhage, unspecified: Secondary | ICD-10-CM | POA: Diagnosis not present

## 2023-09-02 DIAGNOSIS — K283 Acute gastrojejunal ulcer without hemorrhage or perforation: Secondary | ICD-10-CM

## 2023-09-02 HISTORY — PX: ESOPHAGOGASTRODUODENOSCOPY: SHX5428

## 2023-09-02 LAB — CBC
HCT: 24.4 % — ABNORMAL LOW (ref 36.0–46.0)
HCT: 22.7 % — ABNORMAL LOW (ref 36.0–46.0)
HCT: 22.2 % — ABNORMAL LOW (ref 36.0–46.0)
Hemoglobin: 7.8 g/dL — ABNORMAL LOW (ref 12.0–15.0)
Hemoglobin: 7.3 g/dL — ABNORMAL LOW (ref 12.0–15.0)
Hemoglobin: 7.1 g/dL — ABNORMAL LOW (ref 12.0–15.0)
MCH: 32.4 pg (ref 26.0–34.0)
MCH: 32.6 pg (ref 26.0–34.0)
MCH: 32.7 pg (ref 26.0–34.0)
MCHC: 32 g/dL (ref 30.0–36.0)
MCHC: 32.2 g/dL (ref 30.0–36.0)
MCHC: 32 g/dL (ref 30.0–36.0)
MCV: 101.2 fL — ABNORMAL HIGH (ref 80.0–100.0)
MCV: 101.3 fL — ABNORMAL HIGH (ref 80.0–100.0)
MCV: 102.3 fL — ABNORMAL HIGH (ref 80.0–100.0)
Platelets: 141 K/uL — ABNORMAL LOW (ref 150–400)
Platelets: 142 K/uL — ABNORMAL LOW (ref 150–400)
Platelets: 131 K/uL — ABNORMAL LOW (ref 150–400)
RBC: 2.41 MIL/uL — ABNORMAL LOW (ref 3.87–5.11)
RBC: 2.24 MIL/uL — ABNORMAL LOW (ref 3.87–5.11)
RBC: 2.17 MIL/uL — ABNORMAL LOW (ref 3.87–5.11)
RDW: 13.1 % (ref 11.5–15.5)
RDW: 13.2 % (ref 11.5–15.5)
RDW: 13.1 % (ref 11.5–15.5)
WBC: 9.9 K/uL (ref 4.0–10.5)
WBC: 9.1 K/uL (ref 4.0–10.5)
WBC: 6.8 K/uL (ref 4.0–10.5)
nRBC: 0 % (ref 0.0–0.2)
nRBC: 0 % (ref 0.0–0.2)
nRBC: 0 % (ref 0.0–0.2)

## 2023-09-02 LAB — BASIC METABOLIC PANEL WITH GFR
Anion gap: 6 (ref 5–15)
BUN: 39 mg/dL — ABNORMAL HIGH (ref 8–23)
CO2: 25 mmol/L (ref 22–32)
Calcium: 8.2 mg/dL — ABNORMAL LOW (ref 8.9–10.3)
Chloride: 106 mmol/L (ref 98–111)
Creatinine, Ser: 1.22 mg/dL — ABNORMAL HIGH (ref 0.44–1.00)
GFR, Estimated: 46 mL/min — ABNORMAL LOW (ref >60)
Glucose, Bld: 93 mg/dL (ref 70–99)
Potassium: 4.2 mmol/L (ref 3.5–5.1)
Sodium: 137 mmol/L (ref 135–145)

## 2023-09-02 LAB — MRSA NEXT GEN BY PCR, NASAL: MRSA by PCR Next Gen: NOT DETECTED

## 2023-09-02 SURGERY — EGD (ESOPHAGOGASTRODUODENOSCOPY)
Anesthesia: General

## 2023-09-02 MED ORDER — IOHEXOL 350 MG/ML SOLN: 80.0000 mL | Freq: Once | INTRAVENOUS | Status: DC | PRN

## 2023-09-02 MED ORDER — SODIUM CHLORIDE 0.9 % IV SOLN: INTRAVENOUS | Status: DC

## 2023-09-02 MED ORDER — SODIUM CHLORIDE 0.9 % IV SOLN: INTRAVENOUS | Status: AC

## 2023-09-02 MED ORDER — SUCRALFATE 1 GM/10ML PO SUSP
1.0000 g | Freq: Three times a day (TID) | ORAL | Status: DC
  Administered 2023-09-02 – 2023-09-06 (×18): 1 g via ORAL
  Filled 2023-09-02 (×19): qty 10

## 2023-09-02 MED ORDER — LIDOCAINE 2% (20 MG/ML) 5 ML SYRINGE
INTRAMUSCULAR | Status: DC | PRN
  Administered 2023-09-02: 60 mg via INTRAVENOUS

## 2023-09-02 MED ORDER — CHLORHEXIDINE GLUCONATE CLOTH 2 % EX PADS
6.0000 | MEDICATED_PAD | Freq: Every day | CUTANEOUS | Status: DC
  Administered 2023-09-02 – 2023-09-04 (×3): 6 via TOPICAL

## 2023-09-02 MED ORDER — LACTATED RINGERS IV SOLN: INTRAVENOUS | Status: DC

## 2023-09-02 MED ORDER — PHENYLEPHRINE 80 MCG/ML (10ML) SYRINGE FOR IV PUSH (FOR BLOOD PRESSURE SUPPORT)
PREFILLED_SYRINGE | INTRAVENOUS | Status: DC | PRN
  Administered 2023-09-02 (×2): 160 ug via INTRAVENOUS

## 2023-09-02 MED ORDER — EPINEPHRINE 1 MG/10ML IJ SOSY
PREFILLED_SYRINGE | INTRAMUSCULAR | Status: DC | PRN
  Administered 2023-09-02: 3.5 mL

## 2023-09-02 MED ORDER — PROPOFOL 10 MG/ML IV BOLUS
INTRAVENOUS | Status: DC | PRN
  Administered 2023-09-02 (×3): 40 mg via INTRAVENOUS

## 2023-09-02 NOTE — Op Note (Signed)
 [facility] Patient Name: [name] Procedure Date: 09/02/2023 1:41 PM MRN: [id number] Date of Birth: 1945-05-27 Attending MD: Deatrice Dine , MD, [id number] CSN: [id number] Age: 78 Admit Type: Inpatient Procedure:                Upper GI endoscopy Indications:              Epigastric abdominal pain, Acute post hemorrhagic                            anemia, Melena, Abnormal CT of the GI tract Providers:                Deatrice Dine, MD, Harlene Lips, Jon Loge Referring MD:              Medicines:                Monitored Anesthesia Care Complications:            No immediate complications. Estimated Blood Loss:     Estimated blood loss: none. Procedure:                Pre-Anesthesia Assessment:                           - Prior to the procedure, a History and Physical                            was performed, and patient medications and                            allergies were reviewed. The patient's tolerance of                            previous anesthesia was also reviewed. The risks                            and benefits of the procedure and the sedation                            options and risks were discussed with the patient.                            All questions were answered, and informed consent                            was obtained. Prior Anticoagulants: The patient has                            taken no anticoagulant or antiplatelet agents                            except for aspirin . ASA Grade Assessment: III - A  patient with severe systemic disease. After                            reviewing the risks and benefits, the patient was                            deemed in satisfactory condition to undergo the                            procedure.                           After obtaining informed consent, the endoscope was                            passed under direct vision. Throughout  the                            procedure, the patient's blood pressure, pulse, and                            oxygen saturations were monitored continuously. The                            [id number] ([id number]) Upper was introduced through                            the mouth, and advanced to the afferent and                            efferent jejunal loops. The upper GI endoscopy was                            accomplished without difficulty. The patient                            tolerated the procedure well. Scope In: 2:09:08 PM Scope Out: 2:19:27 PM Total Procedure Duration: 0 hours 10 minutes 19 seconds  Findings:      The examined esophagus was normal.      Evidence of a gastric bypass was found. A gastric pouch with a 5 cm       length from the GE junction to the gastrojejunal anastomosis was found.       The gastrojejunal anastomosis was characterized by ulceration. This was       traversed. The pouch-to-jejunum limb was characterized by ulceration.      One non-bleeding cratered gastric ulcer with an adherent clot (Forrest       Class IIb) was found at the anastomosis. Clot was attempted to suction       and lavage with some clearance . Area was successfully injected with 4       mL of a 0.1 mg/mL solution of epinephrine  for hemostasis. Impression:               - Normal esophagus.                           -  Gastric bypass with a pouch 5 cm in length.                            Gastrojejunal anastomosis characterized by                            ulceration.                           - Very large cratered gastric anastomotic ulcer                            with an adherent clot (Forrest Class IIb). Injected.                           - No specimens collected. Moderate Sedation:      Per Anesthesia Care Recommendation:           Clear liquid diet today and advance tomorrow if no                            drop in HBG or no active overt bleeding encountered                            High dose PPI BID                           Upon discharge would change to pantoprazole  40mg  BID                           Sucralfate                            Avoid all NSAIDs                           Would recommend surveillance in 6-9 weeks to ensure                            healing of this large lesion and exclude malignancy  Procedure Code(s):        --- Professional ---                           56744, Esophagogastroduodenoscopy, flexible,                            transoral; with control of bleeding, any method Diagnosis Code(s):        --- Professional ---                           K28.9, Gastrojejunal ulcer, unspecified as acute or                            chronic, without hemorrhage or perforation  K25.4, Chronic or unspecified gastric ulcer with                            hemorrhage                           R10.13, Epigastric pain                           D62, Acute posthemorrhagic anemia                           K92.1, Melena (includes Hematochezia)                           R93.3, Abnormal findings on diagnostic imaging of                            other parts of digestive tract CPT copyright 2022 American [employer]. All rights reserved. The codes documented in this report are preliminary and upon coder review may  be revised to meet current compliance requirements. Deatrice Dine, MD Deatrice Dine, MD 09/02/2023 2:29:35 PM This report has been signed electronically. Number of Addenda: 0

## 2023-09-02 NOTE — Anesthesia Preprocedure Evaluation (Addendum)
 Anesthesia Evaluation  Patient identified by MRN, date of birth, ID band Patient awake    Reviewed: Allergy & Precautions, NPO status , Patient's Chart, lab work & pertinent test results  History of Anesthesia Complications (+) history of anesthetic complications  Airway Mallampati: III  TM Distance: >3 FB Neck ROM: Full    Dental  (+) Dental Advisory Given, Poor Dentition, Missing, Chipped   Pulmonary shortness of breath, sleep apnea , Recent URI , Resolved, former smoker States unable to tolerate CPAP Denies O2 use smoking, or breathing meds    Pulmonary exam normal breath sounds clear to auscultation       Cardiovascular Exercise Tolerance: Poor hypertension, Pt. on medications Normal cardiovascular exam+ dysrhythmias Atrial Fibrillation II Rhythm:Regular Rate:Normal  1. Left ventricular ejection fraction, by estimation, is 55 to 60%. The  left ventricle has normal function. The left ventricle has no regional  wall motion abnormalities. There is mild concentric left ventricular  hypertrophy. Left ventricular diastolic  parameters are indeterminate.  2. Right ventricular systolic function is normal. The right ventricular  size is normal. There is normal pulmonary artery systolic pressure.  3. Left atrial size was severely dilated.  4. Right atrial size was mildly dilated.  5. The mitral valve is degenerative. Mild mitral valve regurgitation.  6. The aortic valve is tricuspid. Aortic valve regurgitation is not  visualized. No aortic stenosis is present.  7. The inferior vena cava is dilated in size with >50% respiratory  variability, suggesting right atrial pressure of 8 mmHg.  Persistent AF   Neuro/Psych  Headaches PSYCHIATRIC DISORDERS Anxiety Depression     Neuromuscular disease negative neurological ROS  negative psych ROS   GI/Hepatic negative GI ROS, Neg liver ROS, hiatal hernia, PUD,GERD  Medicated and  Controlled,,Abdominal pain and nausea   Endo/Other  negative endocrine ROSHypothyroidism    Renal/GU Renal InsufficiencyRenal diseasenegative Renal ROS  negative genitourinary   Musculoskeletal negative musculoskeletal ROS (+) Arthritis , Osteoarthritis,    Abdominal Normal abdominal exam  (+)   Peds negative pediatric ROS (+)  Hematology  (+) Blood dyscrasia, anemia Hgb 7.8   Anesthesia Other Findings   Reproductive/Obstetrics negative OB ROS                              Anesthesia Physical Anesthesia Plan  ASA: 3  Anesthesia Plan: General   Post-op Pain Management: Minimal or no pain anticipated   Induction: Intravenous  PONV Risk Score and Plan: Propofol  infusion  Airway Management Planned: Nasal Cannula and Natural Airway  Additional Equipment: None  Intra-op Plan:   Post-operative Plan:   Informed Consent: I have reviewed the patients History and Physical, chart, labs and discussed the procedure including the risks, benefits and alternatives for the proposed anesthesia with the patient or authorized representative who has indicated his/her understanding and acceptance.     Dental advisory given  Plan Discussed with: CRNA  Anesthesia Plan Comments:          Anesthesia Quick Evaluation

## 2023-09-02 NOTE — Anesthesia Postprocedure Evaluation (Signed)
 Anesthesia Post Note  Patient: [name]  Procedure(s) Performed: EGD (ESOPHAGOGASTRODUODENOSCOPY)  Patient location during evaluation: Phase II Anesthesia Type: General Level of consciousness: awake and alert Pain management: pain level controlled Vital Signs Assessment: post-procedure vital signs reviewed and stable Respiratory status: spontaneous breathing, nonlabored ventilation and respiratory function stable Cardiovascular status: blood pressure returned to baseline and stable Postop Assessment: no apparent nausea or vomiting Anesthetic complications: no   There were no known notable events for this encounter.   Last Vitals:  Vitals:   09/02/23 1423 09/02/23 1430  BP: (!) 88/61 (!) 103/52  Pulse: 77 87  Resp: (!) 9 19  Temp: 36.5 C   SpO2: 100% 100%    Last Pain:  Vitals:   09/02/23 1423  TempSrc:   PainSc: Asleep                 Jevaughn Degollado L Walton Digilio

## 2023-09-02 NOTE — TOC Initial Note (Signed)
 Transition of Care [facility]) - Initial/Assessment Note    Patient Details  Name: [name] MRN: [id number] Date of Birth: Oct 19, 1945  [facility] CM/SW Contact:    Lucie Lunger, LCSWA Phone Number: 09/02/2023, 11:34 AM  Clinical Narrative:                 Pt is high risk for readmission. CSW spoke with pt at bedside to complete assessment. Pt states she lives with her daughter. Pt is mostly independent in completing her ADLs. Pts daughter drives her when needed. Pt has a quad cane to use when needed. Pt states she has not had HH in the past. TOC to follow.   Expected Discharge Plan: Home/Self Care Barriers to Discharge: Continued Medical Work up   Patient Goals and CMS Choice Patient states their goals for this hospitalization and ongoing recovery are:: return home CMS Medicare.gov Compare Post Acute Care list provided to:: Patient Choice offered to / list presented to : Patient      Expected Discharge Plan and Services In-house Referral: Clinical Social Work Discharge Planning Services: CM Consult   Living arrangements for the past 2 months: Single Family Home                                      Prior Living Arrangements/Services Living arrangements for the past 2 months: Single Family Home Lives with:: Adult Children Patient language and need for interpreter reviewed:: Yes Do you feel safe going back to the place where you live?: Yes      Need for Family Participation in Patient Care: Yes (Comment) Care giver support system in place?: Yes (comment) Current home services: DME Criminal Activity/Legal Involvement Pertinent to Current Situation/Hospitalization: No - Comment as needed  Activities of Daily Living   ADL Screening (condition at time of admission) Independently performs ADLs?: Yes (appropriate for developmental age) Is the patient deaf or have difficulty hearing?: No Does the patient have difficulty seeing, even when wearing  glasses/contacts?: No  Permission Sought/Granted                  Emotional Assessment Appearance:: Appears stated age Attitude/Demeanor/Rapport: Engaged Affect (typically observed): Accepting Orientation: : Oriented to Self, Oriented to Place, Oriented to  Time, Oriented to Situation Alcohol / Substance Use: Not Applicable Psych Involvement: No (comment)  Admission diagnosis:  GI bleeding [K92.2] Gastrointestinal hemorrhage, unspecified gastrointestinal hemorrhage type [K92.2] Patient Active Problem List   Diagnosis Date Noted   GI bleeding 09/01/2023   Presence of heart assist device (HCC) 08/21/2023   Cervical myelopathy (HCC) 08/21/2023   Stage 3a chronic kidney disease (HCC) 08/21/2023   Moderate cognitive impairment 08/18/2023   Presbycusis of both ears 08/18/2023   Personal history of traumatic brain injury 08/18/2023   History of bariatric surgery 08/18/2023   Major depressive disorder in partial remission (HCC) 08/18/2023   Prediabetes 05/12/2023   Cognitive changes 09/09/2022   Presence of Watchman left atrial appendage closure device 12/03/2021   Atrial fibrillation (HCC) 12/03/2021   Dysuria 05/06/2021   Unintentional weight loss 05/06/2021   Abdominal wall hernia 08/14/2019   Constipation 08/14/2019   Right sided abdominal pain    Persistent atrial fibrillation (HCC)    Sensory disturbance 06/11/2019   Intractable nausea and vomiting 06/10/2019   Atrial fibrillation with RVR (HCC) 06/09/2019   Anastomotic ulcer    Acute anemia 04/04/2019  Anemia    Excessive use of nonsteroidal anti-inflammatory drug (NSAID)    Nausea with vomiting 03/01/2019   Diarrhea 03/01/2019   Abdominal pain, epigastric 06/08/2017   LLQ pain 06/08/2017   Odynophagia 06/08/2017   Dysphagia 06/08/2017   Copper  deficiency 06/17/2015   Gastric ulceration    Hiatal hernia    Iron deficiency anemia    Spondylolisthesis of lumbar region 01/01/2013   Palpitations 05/08/2012    Insomnia due to mental disorder 12/03/2011   Benzodiazepine causing adverse effect in therapeutic use 12/03/2011   right upper urethral calculus with obstructionh 04/08/2010   Hypothyroidism 03/26/2010   Anxiety state 03/26/2010   H/O gastric bypass 03/26/2010   Hydronephrosis, right 01/27/2010   HEMOCCULT POSITIVE STOOL 08/11/2009   Generalized abdominal pain 05/20/2009   BENIGN NEOPLASM OF PARATHYROID  GLAND 03/19/2009   B12 deficiency 03/19/2009   HYPERCHOLESTEROLEMIA 03/19/2009   DEPRESSION/ANXIETY 03/19/2009   Essential hypertension 03/19/2009   Gastroesophageal reflux disease 03/19/2009   Osteoarthritis 03/19/2009   PCP:  Terry Wilhelmena Lloyd Hilario, FNP Pharmacy:   [facility] [phone] - [place], [place] - [address] AT [facility] [address] [place] [address] Phone: [phone] Fax: [phone]     Social Drivers of Health (SDOH) Social History: SDOH Screenings   Food Insecurity: No Food Insecurity (09/02/2023)  Housing: Low Risk  (09/02/2023)  Transportation Needs: No Transportation Needs (09/02/2023)  Utilities: Not At Risk (09/02/2023)  Alcohol Screen: Low Risk  (12/15/2022)  Depression (PHQ2-9): High Risk (05/12/2023)  Financial Resource Strain: Low Risk  (12/15/2022)  Physical Activity: Inactive (12/15/2022)  Social Connections: Moderately Isolated (09/02/2023)  Stress: No Stress Concern Present (12/15/2022)  Tobacco Use: Medium Risk (09/01/2023)  Health Literacy: Adequate Health Literacy (12/15/2022)   SDOH Interventions:     Readmission Risk Interventions    09/02/2023   11:33 AM  Readmission Risk Prevention Plan  Transportation Screening Complete  HRI or Home Care Consult Complete  Social Work Consult for Recovery Care Planning/Counseling Complete  Palliative Care Screening Not Applicable  Medication Review Oceanographer) Complete

## 2023-09-02 NOTE — Plan of Care (Signed)

## 2023-09-02 NOTE — Progress Notes (Signed)
  Progress Note   Patient: [name] FMW:[id number] DOB: 1945/11/04 DOA: 09/01/2023     1 DOS: the patient was seen and examined on 09/02/2023    Assessment and Plan: ABLA due to suspected GI bleed & peritoneal fat stranding along the surgical anastomosis involving the left upper quadrant - GI is consulted  - IV zosyn  3.375 g q8hr  - Compazine  5 mg q6 hr PRN - IV protonix  40 mg q12  - NPO - IV NS 75 cc/hr  - IV fentanyl  q2 hr PRN  - Oxycodone  PO q4 hr PRN  - B12/folate/ferritin/iron ordered  CKD3a - Kidney function improving - Continue IV fluids as above   Afib - Atenolol  50 mg PO daily  - Diltiazem  24 hr 240 mg PO daily   Hypothyroidism  - Synthroid  75 mcg PO daily   Dementia  - Aricept  10 mg PO at bedtime   Depression/anxiety  - Effexor  75 mg PO bid   Subjective: Pt seen and examined at the bedside. GI is aware of the pt and further recommendations are pending at this time. Hgb in the 7.8 - 8.5 range.   Physical Exam: Vitals:   09/02/23 0600 09/02/23 0802 09/02/23 0806 09/02/23 0900  BP: 107/66  (!) 159/88 139/74  Pulse: 92  92 92  Resp: 17   18  Temp:  98.3 F (36.8 C)    TempSrc:  Oral    SpO2: 96%   95%  Weight:  69.1 kg    Height:  5' 5 (1.651 m)     Physical Exam HENT:     Head: Normocephalic.  Cardiovascular:     Rate and Rhythm: Normal rate.  Pulmonary:     Effort: Pulmonary effort is normal.  Abdominal:     Palpations: Abdomen is soft.  Skin:    General: Skin is warm.  Neurological:     Mental Status: She is alert.  Psychiatric:        Mood and Affect: Mood normal.      Disposition: Status is: Inpatient Remains inpatient appropriate because: awaiting formal GI consult, IV fluids and IV antibx  Planned Discharge Destination: Barriers to discharge: As noted above     Time spent: 35 minutes  Author: ATLEE ABERNETHY , MD 09/02/2023 10:31 AM  For on call review [email or web address].

## 2023-09-02 NOTE — Progress Notes (Deleted)
 Referring Provider: Del Orbe Polanco, Ilian* Primary Care Physician:  Terry Wilhelmena Lloyd Hilario, FNP Primary GI Physician: Dr. PIERRETTE Johns chief complaint on file.   HPI:   [name] is a 78 y.o. female presenting today with a history of   Past Medical History:  Diagnosis Date   Anemia    Anxiety    Arthritis    Chronic back pain    Spondylosis and stenosis   Depression    GERD (gastroesophageal reflux disease)    Headache(784.0)    Hiatal hernia    History of blood transfusion    History of GI bleed    Hypothyroidism    Mitral regurgitation    Obsessive-compulsive disorder    Persistent atrial fibrillation (HCC)    Presence of Watchman left atrial appendage closure device 12/03/2021   31mm Watchman FLX with Dr. Wonda   Recurrent upper respiratory infection (URI)    Sleep apnea     Past Surgical History:  Procedure Laterality Date   ABDOMINAL HYSTERECTOMY     ANTERIOR CERVICAL DECOMP/DISCECTOMY FUSION  12/09/2010   Procedure: ANTERIOR CERVICAL DECOMPRESSION/DISCECTOMY FUSION 3 LEVELS;  Surgeon: Reyes JONETTA Budge;  Location: [facility];  Service: Neurosurgery;  Laterality: N/A;  Cervical three-four,Cervical four-five,Cervical Five-Six,Cervical Six-Seven ANTERIOR CERVICAL DECOMPRESSION WITH FUSION INTERBODY PROTHESIS PLATING AND BONEGRAFT   APPENDECTOMY     BIOPSY N/A 11/14/2014   Procedure: GASTRIC BIOPSY;  Surgeon: Lamar CHRISTELLA Hollingshead, MD;  Location: [facility];  Service: Endoscopy;  Laterality: N/A;   CESAREAN SECTION     CHOLECYSTECTOMY     COLONOSCOPY WITH PROPOFOL  N/A 11/14/2014   RMR: redundant colon but otherwise normal   COLONOSCOPY WITH PROPOFOL  N/A 05/12/2020   Dr. Hollingshead;  Seven 4 to 7 mm polyps, diverticulosis in the sigmoid colon. Pathology with tubular adenomas. Recommended repeat in 3 years.   ESOPHAGOGASTRODUODENOSCOPY (EGD) WITH PROPOFOL  N/A 11/14/2014   RMRs/p gastric surgery/anastomtic ulcer   ESOPHAGOGASTRODUODENOSCOPY (EGD) WITH PROPOFOL  N/A  09/15/2017   Procedure: ESOPHAGOGASTRODUODENOSCOPY (EGD) WITH PROPOFOL ;  Surgeon: Hollingshead Lamar CHRISTELLA, MD;  Location: [facility];  Service: Endoscopy;  Laterality: N/A;  11:30am   ESOPHAGOGASTRODUODENOSCOPY (EGD) WITH PROPOFOL  N/A 04/04/2019   2 cm ulcer crater at anastomosis with smaller satellite areas of ulceration in setting of NSAIDs/Goody powders, no bleeding stigmata.   ESOPHAGOGASTRODUODENOSCOPY (EGD) WITH PROPOFOL  N/A 06/13/2019   Rourk: Prior gastric bypass surgery with Billroth II type anatomy, otherwise normal-appearing residual upper GI tract.   ESOPHAGOGASTRODUODENOSCOPY (EGD) WITH PROPOFOL  N/A 12/01/2022   Procedure: ESOPHAGOGASTRODUODENOSCOPY (EGD) WITH PROPOFOL ;  Surgeon: Hollingshead Lamar CHRISTELLA, MD;  Location: [facility];  Service: Endoscopy;  Laterality: N/A;  830am, asa 3   EYE SURGERY     bilateral cataracts removed, w/IOL   GASTRIC BYPASS  2008   JOINT REPLACEMENT     07/2010 &  2002- respectively- both knees    LEFT ATRIAL APPENDAGE OCCLUSION N/A 12/03/2021   Procedure: LEFT ATRIAL APPENDAGE OCCLUSION;  Surgeon: Wonda Sharper, MD;  Location: [facility] CV [facility];  Service: Cardiovascular;  Laterality: N/A;   MALONEY DILATION N/A 09/15/2017   Procedure: AGAPITO DILATION;  Surgeon: Hollingshead Lamar CHRISTELLA, MD;  Location: [facility];  Service: Endoscopy;  Laterality: N/A;   MALONEY DILATION N/A 12/01/2022   Procedure: AGAPITO DILATION;  Surgeon: Hollingshead Lamar CHRISTELLA, MD;  Location: [facility];  Service: Endoscopy;  Laterality: N/A;   PARATHYROIDECTOMY     PERIPHERALLY INSERTED CENTRAL CATHETER INSERTION     PICC line  for treatment of MRSA   POLYPECTOMY  05/12/2020   Procedure: POLYPECTOMY;  Surgeon: Shaaron Lamar HERO, MD;  Location: [facility];  Service: Endoscopy;;   TEE WITHOUT CARDIOVERSION N/A 12/03/2021   Procedure: TRANSESOPHAGEAL ECHOCARDIOGRAM (TEE);  Surgeon: Wonda Sharper, MD;  Location: [facility] CV LAB;  Service: Cardiovascular;  Laterality: N/A;   TONSILLECTOMY      as a child    No current facility-administered medications for this visit.   No current outpatient medications on file.   Facility-Administered Medications Ordered in Other Visits  Medication Dose Route Frequency Provider Last Rate Last Admin   0.9 %  sodium chloride  infusion   Intravenous Continuous Opyd, Timothy S, MD 75 mL/hr at 09/02/23 0803 Infusion Verify at 09/02/23 9196   acetaminophen  (TYLENOL ) tablet 650 mg  650 mg Oral Q6H PRN Opyd, Evalene RAMAN, MD       Or   acetaminophen  (TYLENOL ) suppository 650 mg  650 mg Rectal Q6H PRN Opyd, Evalene RAMAN, MD       atenolol  (TENORMIN ) tablet 50 mg  50 mg Oral Daily Opyd, Timothy S, MD   50 mg at 09/02/23 9193   Chlorhexidine  Gluconate Cloth 2 % PADS 6 each  6 each Topical Daily Sira, Zackery, MD   6 each at 09/02/23 0813   diltiazem  (CARDIZEM  CD) 24 hr capsule 240 mg  240 mg Oral Daily Opyd, Timothy S, MD   240 mg at 09/02/23 9192   donepezil  (ARICEPT ) tablet 10 mg  10 mg Oral QHS Opyd, Timothy S, MD   10 mg at 09/01/23 2259   fentaNYL  (SUBLIMAZE ) injection 12.5-50 mcg  12.5-50 mcg Intravenous Q2H PRN Opyd, Timothy S, MD   50 mcg at 09/01/23 2259   iohexol  (OMNIPAQUE ) 350 MG/ML injection 80 mL  80 mL Intravenous Once PRN Sira, Zackery, MD       levothyroxine  (SYNTHROID ) tablet 75 mcg  75 mcg Oral Q0600 Opyd, Timothy S, MD   75 mcg at 09/02/23 0524   oxybutynin  (DITROPAN -XL) 24 hr tablet 15 mg  15 mg Oral QHS Opyd, Timothy S, MD   15 mg at 09/01/23 2259   oxyCODONE  (Oxy IR/ROXICODONE ) immediate release tablet 2.5-5 mg  2.5-5 mg Oral Q4H PRN Opyd, Timothy S, MD   5 mg at 09/02/23 9687   pantoprazole  (PROTONIX ) injection 40 mg  40 mg Intravenous Q12H Opyd, Timothy S, MD   40 mg at 09/02/23 0525   piperacillin -tazobactam (ZOSYN ) IVPB 3.375 g  3.375 g Intravenous Q8H [place] Agent, [facility]   Stopped at 09/02/23 9241   prochlorperazine  (COMPAZINE ) injection 5 mg  5 mg Intravenous Q6H PRN Opyd, Evalene RAMAN, MD       rosuvastatin  (CRESTOR ) tablet 40 mg  40 mg  Oral Daily Opyd, Timothy S, MD   40 mg at 09/02/23 9193   sodium chloride  flush (NS) 0.9 % injection 3 mL  3 mL Intravenous Q12H Opyd, Timothy S, MD   3 mL at 09/02/23 0813   venlafaxine  (EFFEXOR ) tablet 75 mg  75 mg Oral BID Opyd, Timothy S, MD   75 mg at 09/02/23 0806    Allergies as of 09/05/2023 - Review Complete 09/01/2023  Allergen Reaction Noted   Benzodiazepines Other (See Comments) 12/03/2011   Nsaids Other (See Comments) 12/22/2012   Statins Nausea And Vomiting    Codeine Nausea Only    Escitalopram oxalate Nausea Only 12/02/2010   Sertraline  hcl Nausea Only    Tramadol hcl Other (See Comments) 12/22/2012    Family  History  Problem Relation Age of Onset   Anxiety disorder Mother    Breast cancer Mother    Heart attack Father    Anxiety disorder Sister    Breast cancer Sister    Bipolar disorder Daughter    OCD Daughter    Anesthesia problems Neg Hx    Hypotension Neg Hx    Malignant hyperthermia Neg Hx    Pseudochol deficiency Neg Hx    Dementia Neg Hx    Alcohol abuse Neg Hx    Drug abuse Neg Hx    Depression Neg Hx    Paranoid behavior Neg Hx    Schizophrenia Neg Hx    Seizures Neg Hx    Sexual abuse Neg Hx    Physical abuse Neg Hx    Colon cancer Neg Hx     Social History   Socioeconomic History   Marital status: Widowed    Spouse name: Not on file   Number of children: 4   Years of education: Not on file   Highest education level: Not on file  Occupational History   Not on file  Tobacco Use   Smoking status: Former    Current packs/day: 0.00    Average packs/day: 1 pack/day for 30.0 years (30.0 ttl pk-yrs)    Types: Cigarettes    Start date: 01/19/1968    Quit date: 01/18/1998    Years since quitting: 25.6   Smokeless tobacco: Never  Vaping Use   Vaping status: Never Used  Substance and Sexual Activity   Alcohol use: No   Drug use: No   Sexual activity: Not on file    Comment: Hysterectomy  Other Topics Concern   Not on file  Social  History Narrative   Not on file   Social Drivers of Health   Financial Resource Strain: Low Risk  (12/15/2022)   Overall Financial Resource Strain (CARDIA)    Difficulty of Paying Living Expenses: Not hard at all  Food Insecurity: No Food Insecurity (09/02/2023)   Hunger Vital Sign    Worried About Running Out of Food in the Last Year: Never true    Ran Out of Food in the Last Year: Never true  Transportation Needs: No Transportation Needs (09/02/2023)   PRAPARE - Administrator, Civil Service (Medical): No    Lack of Transportation (Non-Medical): No  Physical Activity: Inactive (12/15/2022)   Exercise Vital Sign    Days of Exercise per Week: 0 days    Minutes of Exercise per Session: 0 min  Stress: No Stress Concern Present (12/15/2022)   [employer] of Occupational Health - Occupational Stress Questionnaire    Feeling of Stress : Not at all  Social Connections: Moderately Isolated (09/02/2023)   Social Connection and Isolation Panel    Frequency of Communication with Friends and Family: More than three times a week    Frequency of Social Gatherings with Friends and Family: More than three times a week    Attends Religious Services: More than 4 times per year    Active Member of [employer] or Organizations: No    Attends Banker Meetings: Never    Marital Status: Widowed    Review of Systems: Gen: Denies fever, chills, anorexia. Denies fatigue, weakness, weight loss.  CV: Denies chest pain, palpitations, syncope, peripheral edema, and claudication. Resp: Denies dyspnea at rest, cough, wheezing, coughing up blood, and pleurisy. GI: Denies vomiting blood, jaundice, and fecal incontinence.   Denies dysphagia or odynophagia.  Derm: Denies rash, itching, dry skin Psych: Denies depression, anxiety, memory loss, confusion. No homicidal or suicidal ideation.  Heme: Denies bruising, bleeding, and enlarged lymph nodes.  Physical Exam: There were no vitals taken  for this visit. General:   Alert and oriented. No distress noted. Pleasant and cooperative.  Head:  Normocephalic and atraumatic. Eyes:  Conjuctiva clear without scleral icterus. Heart:  S1, S2 present without murmurs appreciated. Lungs:  Clear to auscultation bilaterally. No wheezes, rales, or rhonchi. No distress.  Abdomen:  +BS, soft, non-tender and non-distended. No rebound or guarding. No HSM or masses noted. Msk:  Symmetrical without gross deformities. Normal posture. Extremities:  Without edema. Neurologic:  Alert and  oriented x4 Psych:  Normal mood and affect.    Assessment:     Plan:  ***   Josette Centers, PA-C [facility] 09/05/2023

## 2023-09-02 NOTE — H&P (View-Only) (Signed)
 Gastroenterology Consult   Referring Provider: Dr. Luz  Primary Care Physician:  Kendra Wilhelmena Lloyd Hilario, FNP Primary Gastroenterologist:  Dr. Shaaron   Patient ID: [name]; [id number]; 07/30/1945   Admit date: 09/01/2023  LOS: 1 day   Date of Consultation: 09/02/2023  Reason for Consultation:  acute blood loss anemia   History of Present Illness   [name] is a 78 y.o. year old female with a history of gastric bypass in 2007, IDA, anastomotic ulcer in 2016 and March 2021 with surveillance documented in 2021, GERD, chronic N/V, chronic constipation, adenomatous colon polyps due for surveillance colonoscopy in April 2025, chronic right sided abdominal pain with known right lateral abdominal wall hernia and right sided spigelian hernia,  atrial fibrillation status post Watchman no longer anticoagulated, CKD, dementia, depression, anxiety, presenting to the ED yesterday with reports of abdominal pain and black stool. Due to patient's history of dementia, history is limited. Daughter is not present at time of procedure but I was able to reach her by phone. Last seen in Dec 2024 by [facility] as outpatient.   In the ED: Hgb 8.5, previously 13 in April 2025. Hgb 7.8 this morning. Creatinine 1.26, close to baseline, BUN 51, lipase 37, unknown hemoccult status.  CTA yesterday: diffuse fat stranding in LUQ around site of GJ anastomosis, suspect active infectious/inflammatory process. No free air.    Today: Patient resting in bed without distress. No family at bedside. Due to cognitive deficits, unable to obtain thorough history. She is unable to report the year or her birth day. She is able to report she is in [place] and why she is here bleeding and abdominal pain. She lives with her daughter, who she states is the POA. Patient notes vague abdominal pain, pointing to upper abdomen. No dysphagia, N/V. Feels her reflux symptoms are worse. Outpatient med list includes BID PPI. No NSAIDs on  outpatient med list.   Spoke with daughter on phone: loose and black stool on Monday Takes Miralax  daily. Patient told daughter she had been seeing this for a few days. No oral iron or pepto. Daughter fills medication trays. No NSAIDs. Daughter states she has reported abdominal pain since having gastric bypass in 2007. She states patient has been out of pantoprazole  inadvertently since last week as somehow was misplaced and daughter thought she had been receiving. States mother has had decreased appetite over last few weeks.   Can call and leave message with daughter as she will be working today til 7pm. [name], cell [phone]   Last EGD 12/01/2022: Normal esophagus s/p empiric dilation, small hiatal hernia, abnormal residual gastric mucosa s/p biopsy.  Pathology showed gastric oxyntic mucosa with changes consistent with PPI therapy, negative for H. pylori.   Colonoscopy April 2022: seven 4-7 mm polyps, diverticulosis, tubular adenomas, surveillance in 3 years.    EGD 06/13/2019:Prior gastric bypass surgery with Billroth II type anatomy, otherwise normal-appearing residual upper GI tract   March 2021 with abdominal pain status post EGD with 2 cm ulcer crater at the anastomosis with smaller satellite areas of ulceration in the setting of NSAID/Goody powders.    Past Medical History:  Diagnosis Date   Anemia    Anxiety    Arthritis    Chronic back pain    Spondylosis and stenosis   Depression    GERD (gastroesophageal reflux disease)    Headache(784.0)    Hiatal hernia    History of blood transfusion    History of  GI bleed    Hypothyroidism    Mitral regurgitation    Obsessive-compulsive disorder    Persistent atrial fibrillation (HCC)    Presence of Watchman left atrial appendage closure device 12/03/2021   31mm Watchman FLX with Dr. Wonda   Recurrent upper respiratory infection (URI)    Sleep apnea     Past Surgical History:  Procedure Laterality Date   ABDOMINAL  HYSTERECTOMY     ANTERIOR CERVICAL DECOMP/DISCECTOMY FUSION  12/09/2010   Procedure: ANTERIOR CERVICAL DECOMPRESSION/DISCECTOMY FUSION 3 LEVELS;  Surgeon: Reyes JONETTA Budge;  Location: [facility];  Service: Neurosurgery;  Laterality: N/A;  Cervical three-four,Cervical four-five,Cervical Five-Six,Cervical Six-Seven ANTERIOR CERVICAL DECOMPRESSION WITH FUSION INTERBODY PROTHESIS PLATING AND BONEGRAFT   APPENDECTOMY     BIOPSY N/A 11/14/2014   Procedure: GASTRIC BIOPSY;  Surgeon: Lamar CHRISTELLA Hollingshead, MD;  Location: [facility];  Service: Endoscopy;  Laterality: N/A;   CESAREAN SECTION     CHOLECYSTECTOMY     COLONOSCOPY WITH PROPOFOL  N/A 11/14/2014   RMR: redundant colon but otherwise normal   COLONOSCOPY WITH PROPOFOL  N/A 05/12/2020   Dr. Hollingshead;  Seven 4 to 7 mm polyps, diverticulosis in the sigmoid colon. Pathology with tubular adenomas. Recommended repeat in 3 years.   ESOPHAGOGASTRODUODENOSCOPY (EGD) WITH PROPOFOL  N/A 11/14/2014   RMRs/p gastric surgery/anastomtic ulcer   ESOPHAGOGASTRODUODENOSCOPY (EGD) WITH PROPOFOL  N/A 09/15/2017   Procedure: ESOPHAGOGASTRODUODENOSCOPY (EGD) WITH PROPOFOL ;  Surgeon: Hollingshead Lamar CHRISTELLA, MD;  Location: [facility];  Service: Endoscopy;  Laterality: N/A;  11:30am   ESOPHAGOGASTRODUODENOSCOPY (EGD) WITH PROPOFOL  N/A 04/04/2019   2 cm ulcer crater at anastomosis with smaller satellite areas of ulceration in setting of NSAIDs/Goody powders, no bleeding stigmata.   ESOPHAGOGASTRODUODENOSCOPY (EGD) WITH PROPOFOL  N/A 06/13/2019   Rourk: Prior gastric bypass surgery with Billroth II type anatomy, otherwise normal-appearing residual upper GI tract.   ESOPHAGOGASTRODUODENOSCOPY (EGD) WITH PROPOFOL  N/A 12/01/2022   Procedure: ESOPHAGOGASTRODUODENOSCOPY (EGD) WITH PROPOFOL ;  Surgeon: Hollingshead Lamar CHRISTELLA, MD;  Location: [facility];  Service: Endoscopy;  Laterality: N/A;  830am, asa 3   EYE SURGERY     bilateral cataracts removed, w/IOL   GASTRIC BYPASS  2008   JOINT REPLACEMENT      07/2010 &  2002- respectively- both knees    LEFT ATRIAL APPENDAGE OCCLUSION N/A 12/03/2021   Procedure: LEFT ATRIAL APPENDAGE OCCLUSION;  Surgeon: Wonda Sharper, MD;  Location: [facility] INVASIVE CV LAB;  Service: Cardiovascular;  Laterality: N/A;   MALONEY DILATION N/A 09/15/2017   Procedure: AGAPITO DILATION;  Surgeon: Hollingshead Lamar CHRISTELLA, MD;  Location: [facility];  Service: Endoscopy;  Laterality: N/A;   MALONEY DILATION N/A 12/01/2022   Procedure: AGAPITO DILATION;  Surgeon: Hollingshead Lamar CHRISTELLA, MD;  Location: [facility];  Service: Endoscopy;  Laterality: N/A;   PARATHYROIDECTOMY     PERIPHERALLY INSERTED CENTRAL CATHETER INSERTION     PICC line for treatment of MRSA   POLYPECTOMY  05/12/2020   Procedure: POLYPECTOMY;  Surgeon: Hollingshead Lamar CHRISTELLA, MD;  Location: [facility];  Service: Endoscopy;;   TEE WITHOUT CARDIOVERSION N/A 12/03/2021   Procedure: TRANSESOPHAGEAL ECHOCARDIOGRAM (TEE);  Surgeon: Wonda Sharper, MD;  Location: [facility] INVASIVE CV LAB;  Service: Cardiovascular;  Laterality: N/A;   TONSILLECTOMY     as a child    Prior to Admission medications   Medication Sig Start Date End Date Taking? Authorizing Provider  alendronate  (FOSAMAX ) 70 MG tablet Take 1 tablet (70 mg total) by mouth every 7 (seven) days. Take with a full glass  of water  on an empty stomach. 06/10/23   Del Wilhelmena Lloyd Sola, FNP  atenolol  (TENORMIN ) 50 MG tablet Take 1 tablet (50 mg total) by mouth daily. 12/03/22   Strader, Laymon HERO, PA-C  cyanocobalamin (VITAMIN B12) 1000 MCG/ML injection Inject 1,000 mcg into the muscle every 30 (thirty) days.    [provider]  diltiazem  (CARDIZEM  CD) 240 MG 24 hr capsule Take 1 capsule (240 mg total) by mouth daily. 09/09/22   Del Orbe Polanco, Iliana, FNP  donepezil  (ARICEPT ) 10 MG tablet Take 1 tablet (10 mg total) by mouth at bedtime. 08/18/23   Dohmeier, Dedra, MD  levothyroxine  (SYNTHROID , LEVOTHROID) 75 MCG tablet Take 75 mcg by mouth daily before breakfast.     [provider]  lidocaine  (LIDODERM ) 5 % PLACE 1 PATCH ONTO THE SKIN DAILY. REMOVE & DISCARD PATCH WITHIN 12 HOURS OR AS DIRECTED BY MD 08/23/23   Del Orbe Polanco, Iliana, FNP  ondansetron  (ZOFRAN ) 4 MG tablet Take 1 tablet (4 mg total) by mouth every 8 (eight) hours as needed for nausea or vomiting. 06/08/23   Rudy Josette RAMAN, PA-C  oxybutynin  (DITROPAN  XL) 15 MG 24 hr tablet Take 1 tablet (15 mg total) by mouth at bedtime. 05/12/23   Del Orbe Polanco, Iliana, FNP  pantoprazole  (PROTONIX ) 40 MG tablet Take 1 tablet (40 mg total) by mouth 2 (two) times daily. 08/31/23   Rudy Josette RAMAN, PA-C  penicillin v potassium (VEETID) 500 MG tablet Take 500 mg by mouth 3 (three) times daily. 07/13/23   [provider]  rosuvastatin  (CRESTOR ) 40 MG tablet Take 1 tablet (40 mg total) by mouth daily. 05/18/23   Del Orbe Polanco, Iliana, FNP  triamcinolone  cream (KENALOG ) 0.1 % Apply 1 Application topically 2 (two) times daily. 07/20/20   [provider]  venlafaxine  (EFFEXOR ) 75 MG tablet Take 75 mg by mouth 2 (two) times daily. 07/04/23   [provider]  Vitamin D , Ergocalciferol , (DRISDOL ) 1.25 MG (50000 UNIT) CAPS capsule Take 1 capsule (50,000 Units total) by mouth every Monday. 05/16/23   Del Wilhelmena Lloyd Sola, FNP    Current Facility-Administered Medications  Medication Dose Route Frequency Provider Last Rate Last Admin   0.9 %  sodium chloride  infusion   Intravenous Continuous Opyd, Timothy S, MD 75 mL/hr at 09/02/23 0803 Infusion Verify at 09/02/23 0803   acetaminophen  (TYLENOL ) tablet 650 mg  650 mg Oral Q6H PRN Opyd, Timothy S, MD       Or   acetaminophen  (TYLENOL ) suppository 650 mg  650 mg Rectal Q6H PRN Opyd, Timothy S, MD       atenolol  (TENORMIN ) tablet 50 mg  50 mg Oral Daily Opyd, Timothy S, MD   50 mg at 09/02/23 9193   Chlorhexidine  Gluconate Cloth 2 % PADS 6 each  6 each Topical Daily Sira, Zackery, MD   6 each at 09/02/23 0813   diltiazem  (CARDIZEM  CD)  24 hr capsule 240 mg  240 mg Oral Daily Opyd, Timothy S, MD   240 mg at 09/02/23 9192   donepezil  (ARICEPT ) tablet 10 mg  10 mg Oral QHS Opyd, Timothy S, MD   10 mg at 09/01/23 2259   fentaNYL  (SUBLIMAZE ) injection 12.5-50 mcg  12.5-50 mcg Intravenous Q2H PRN Opyd, Timothy S, MD   50 mcg at 09/01/23 2259   iohexol  (OMNIPAQUE ) 350 MG/ML injection 80 mL  80 mL Intravenous Once PRN Sira, Zackery, MD       levothyroxine  (SYNTHROID ) tablet 75 mcg  75  mcg Oral Q0600 Opyd, Timothy S, MD   75 mcg at 09/02/23 0524   oxybutynin  (DITROPAN -XL) 24 hr tablet 15 mg  15 mg Oral QHS Opyd, Timothy S, MD   15 mg at 09/01/23 2259   oxyCODONE  (Oxy IR/ROXICODONE ) immediate release tablet 2.5-5 mg  2.5-5 mg Oral Q4H PRN Opyd, Timothy S, MD   5 mg at 09/02/23 9687   pantoprazole  (PROTONIX ) injection 40 mg  40 mg Intravenous Q12H Opyd, Timothy S, MD   40 mg at 09/02/23 0525   piperacillin -tazobactam (ZOSYN ) IVPB 3.375 g  3.375 g Intravenous Q8H Laron Agent, [facility]   Stopped at 09/02/23 9241   prochlorperazine  (COMPAZINE ) injection 5 mg  5 mg Intravenous Q6H PRN Opyd, Evalene RAMAN, MD       rosuvastatin  (CRESTOR ) tablet 40 mg  40 mg Oral Daily Opyd, Timothy S, MD   40 mg at 09/02/23 9193   sodium chloride  flush (NS) 0.9 % injection 3 mL  3 mL Intravenous Q12H Opyd, Timothy S, MD   3 mL at 09/02/23 0813   venlafaxine  (EFFEXOR ) tablet 75 mg  75 mg Oral BID Charlton Evalene RAMAN, MD   75 mg at 09/02/23 0806    Allergies as of 09/01/2023 - Review Complete 09/01/2023  Allergen Reaction Noted   Benzodiazepines Other (See Comments) 12/03/2011   Nsaids Other (See Comments) 12/22/2012   Statins Nausea And Vomiting    Codeine Nausea Only    Escitalopram oxalate Nausea Only 12/02/2010   Sertraline  hcl Nausea Only    Tramadol hcl Other (See Comments) 12/22/2012    Family History  Problem Relation Age of Onset   Anxiety disorder Mother    Breast cancer Mother    Heart attack Father    Anxiety disorder Sister    Breast cancer Sister     Bipolar disorder Daughter    OCD Daughter    Anesthesia problems Neg Hx    Hypotension Neg Hx    Malignant hyperthermia Neg Hx    Pseudochol deficiency Neg Hx    Dementia Neg Hx    Alcohol abuse Neg Hx    Drug abuse Neg Hx    Depression Neg Hx    Paranoid behavior Neg Hx    Schizophrenia Neg Hx    Seizures Neg Hx    Sexual abuse Neg Hx    Physical abuse Neg Hx    Colon cancer Neg Hx     Social History   Socioeconomic History   Marital status: Widowed    Spouse name: Not on file   Number of children: 4   Years of education: Not on file   Highest education level: Not on file  Occupational History   Not on file  Tobacco Use   Smoking status: Former    Current packs/day: 0.00    Average packs/day: 1 pack/day for 30.0 years (30.0 ttl pk-yrs)    Types: Cigarettes    Start date: 01/19/1968    Quit date: 01/18/1998    Years since quitting: 25.6   Smokeless tobacco: Never  Vaping Use   Vaping status: Never Used  Substance and Sexual Activity   Alcohol use: No   Drug use: No   Sexual activity: Not on file    Comment: Hysterectomy  Other Topics Concern   Not on file  Social History Narrative   Not on file   Social Drivers of Health   Financial Resource Strain: Low Risk  (12/15/2022)   Overall Financial Resource Strain (CARDIA)  Difficulty of Paying Living Expenses: Not hard at all  Food Insecurity: No Food Insecurity (09/02/2023)   Hunger Vital Sign    Worried About Running Out of Food in the Last Year: Never true    Ran Out of Food in the Last Year: Never true  Transportation Needs: No Transportation Needs (09/02/2023)   PRAPARE - Administrator, Civil Service (Medical): No    Lack of Transportation (Non-Medical): No  Physical Activity: Inactive (12/15/2022)   Exercise Vital Sign    Days of Exercise per Week: 0 days    Minutes of Exercise per Session: 0 min  Stress: No Stress Concern Present (12/15/2022)   [employer] of Occupational Health -  Occupational Stress Questionnaire    Feeling of Stress : Not at all  Social Connections: Moderately Isolated (09/02/2023)   Social Connection and Isolation Panel    Frequency of Communication with Friends and Family: More than three times a week    Frequency of Social Gatherings with Friends and Family: More than three times a week    Attends Religious Services: More than 4 times per year    Active Member of [employer] or Organizations: No    Attends Banker Meetings: Never    Marital Status: Widowed  Intimate Partner Violence: Not At Risk (09/02/2023)   Humiliation, Afraid, Rape, and Kick questionnaire    Fear of Current or Ex-Partner: No    Emotionally Abused: No    Physically Abused: No    Sexually Abused: No     Review of Systems   Limited due to cognitive status. See HPI  Physical Exam   Vital Signs in last 24 hours: Temp:  [98.3 F (36.8 C)-98.5 F (36.9 C)] 98.3 F (36.8 C) (08/15 0802) Pulse Rate:  [61-100] 92 (08/15 0900) Resp:  [9-22] 18 (08/15 0900) BP: (104-159)/(61-93) 139/74 (08/15 0900) SpO2:  [80 %-100 %] 95 % (08/15 0900) Weight:  [68 kg-69.1 kg] 69.1 kg (08/15 0802) Last BM Date : 08/30/23  General:   Alert,  Well-developed, well-nourished, pleasant and cooperative in NAD Head:  Normocephalic and atraumatic. Eyes:  Sclera clear, no icterus.    Ears:  Normal auditory acuity. Lungs:  Clear throughout to auscultation.    Heart:  S1 S2 present, irregularly irregular, moderate systolic murmur Abdomen:  Soft, TTP upper abdomen and LUQ and nondistended. No masses, hepatosplenomegaly. Normal bowel sounds, no guarding or rebound Rectal: deferred   Msk:  Symmetrical without gross deformities. Normal posture. Extremities:  Without edema. Neurologic:  Alert and  oriented to person and place   Intake/Output from previous day: 08/14 0701 - 08/15 0700 In: 1042 [IV Piggyback:1042] Out: -  Intake/Output this shift: Total I/O In: 662.9 [I.V.:633.5; IV  Piggyback:29.4] Out: -    Labs/Studies   Recent Labs Recent Labs    09/01/23 1525 09/01/23 2251 09/02/23 0305  WBC 10.1 9.3 9.9  HGB 8.5* 8.0* 7.8*  HCT 26.1* 24.5* 24.4*  PLT 141* 135* 141*   BMET Recent Labs    09/01/23 1525 09/02/23 0305  NA 137 137  K 4.1 4.2  CL 101 106  CO2 26 25  GLUCOSE 121* 93  BUN 51* 39*  CREATININE 1.26* 1.22*  CALCIUM  8.5* 8.2*   LFT Recent Labs    09/01/23 1525  PROT 5.8*  ALBUMIN 2.9*  AST 18  ALT 17  ALKPHOS 44  BILITOT 0.6     Radiology/Studies CT ANGIO GI BLEED Result Date: 09/01/2023 CLINICAL DATA:  GI  bleed EXAM: CTA ABDOMEN AND PELVIS WITHOUT AND WITH CONTRAST TECHNIQUE: Multidetector CT imaging of the abdomen and pelvis was performed using the standard protocol during bolus administration of intravenous contrast. Multiplanar reconstructed images and MIPs were obtained and reviewed to evaluate the vascular anatomy. RADIATION DOSE REDUCTION: This exam was performed according to the departmental dose-optimization program which includes automated exposure control, adjustment of the mA and/or kV according to patient size and/or use of iterative reconstruction technique. CONTRAST:  80mL OMNIPAQUE  IOHEXOL  350 MG/ML SOLN COMPARISON:  CT abdomen pelvis October 16, 2022, and July 12, 2021. FINDINGS: VASCULAR Aorta: Normal caliber aorta without aneurysm, dissection, vasculitis or significant stenosis. Celiac: Patent without evidence of aneurysm, dissection, vasculitis or significant stenosis. SMA: Patent without evidence of aneurysm, dissection, vasculitis or significant stenosis. Renals: Both renal arteries are patent without evidence of aneurysm, dissection, vasculitis, fibromuscular dysplasia or significant stenosis. IMA: Patent without evidence of aneurysm, dissection, vasculitis or significant stenosis. Inflow: Patent without evidence of aneurysm, dissection, vasculitis or significant stenosis. Proximal Outflow: Bilateral common femoral  and visualized portions of the superficial and profunda femoral arteries are patent without evidence of aneurysm, dissection, vasculitis or significant stenosis. Veins: No obvious venous abnormality within the limitations of this arterial phase study. Review of the MIP images confirms the above findings. NON-VASCULAR Lower chest: Cardiomegaly. Aortic annulus and coronary artery calcification. Partially visualized mitral leaflet calcifications. Hepatobiliary: Cholecystectomy. No biliary dilatation. Punctate right hepatic lobe calcification. Liver appears normal without focal enhancing lesion. Pancreas: Atrophic changes of the pancreas. Spleen: Normal in size without focal abnormality. Punctate calcifications suggestive of prior granulomatous disease. Adrenals/Urinary Tract: Adrenal glands are unremarkable. Kidneys are normal, without renal calculi, focal lesion, or hydronephrosis. Bladder is unremarkable. Stomach/Bowel: Status post Roux-en-Y gastric bypass. There is omental/peritoneal fat stranding along the surgical anastomosis involving the left upper quadrant (9/79-81). Multiple prominent and enhancing mesenteric lymph nodes are identified measuring up to 1.2 cm. (9/90, 88). These findings are similar to prior exam from July 12, 2021 and new to prior exam from 2024. No bowel obstruction or active site of bleeding. Large stool identified throughout the colon. Submucosal fat along the terminal ileum suggestive of chronic colitis. Appendectomy.  No fluid collection or free gas. Lymphatic: No significant vascular findings are present. No enlarged abdominal or pelvic lymph nodes. Reproductive: Hysterectomy.  No adnexal mass. Other: No abdominal wall hernia or abnormality. No abdominopelvic ascites. Musculoskeletal: Degenerative changes of the spine. Posterior spinal fusion device L4-L5 and inter spinous cage. IMPRESSION: VASCULAR Atherosclerotic calcifications, otherwise unremarkable. No definite finding to suggest  active bleeding. NON-VASCULAR 1. Diffuse fat stranding in left upper quadrant around the site of gastrojejunostomy bypass anastomosis, and multiple mesenteric root enhancing lymph nodes/misty mesentery. No definite site of active bleeding, perianastomotic fluid collection or free air . Findings are likely suggestive of active infectious/inflammatory process and similar to prior exam from July 12, 2021. Recommend correlation with endoscopy and clinical findings. 2. Submucosal fat along the terminal ileum suggestive of chronic inflammatory changes. Electronically Signed   By: Megan  Zare M.D.   On: 09/01/2023 20:17     Assessment   MARASIA NEWHALL is a 78 y.o. year old female with a history of gastric bypass in 2007, IDA, anastomotic ulcer in 2016 due to NSAIDs and documented healing in 2021, GERD, chronic N/V, chronic constipation, adenomatous colon polyps, chronic right sided abdominal pain with known right lateral abdominal wall hernia and right sided spigelian hernia,  atrial fibrillation status post Watchman no longer anticoagulated, CKD, dementia,  depression, anxiety, presenting to the ED yesterday with reports of abdominal pain and black stool. Due to patient's history of dementia, history is limited, and majority of information was obtained via telephone by daughter, [name]. GI now consulted due to concerns for acute GI bleed.   Acute anemia with reports of melena: Hgb 8.5 on presentation, down about 4-5 grams from prior in April 2025, currently 7.8 this morning but without melena since admission. Reported black stool earlier this week and also witnessed by daughter. CTA with diffuse fat stranding in LUQ around site of GJ anastomosis, multiple enhancing lymph nodes and misty mesentery but no obvious fluid collection or free air. Suspect recurrent ulceration at GJ anastomosis. She has been without BID PPI for about a week inadvertently; daughter denies any NSAID use. Physical exam without concerns  for acute abdomen today. Plans for diagnostic EGD today. I obtained consent from daughter via telephone.   Abdominal pain: acute on chronic, located upper abdomen/LUQ. Noted to have abdominal pain well-documented since gastric bypass in 2007, no peritoneal signs on exam today, suspect r/t anastomotic findings. If ongoing abdominal pain as outpatient, would recommend evaluating for internal hernia although this could be difficult to document via CT if intermittent. Mesenteric vasculature is patent.   Hx of multiple polyps in 2022: can consider outpatient surveillance once over acute illness.   [name], daughter (cell [phone]) is aware of plans and has given consent via telephone. She will be working till 7pm today but requests we leave message on voicemail if unable to contact.       Plan / Recommendations    Remain NPO EGD today by Dr. Cinderella: I spoke with patient and daughter regarding risks and benefits. Daughter provided telephone consent PPI BID Further recommendations to follow Can consider outpatient elective surveillance colonoscopy if health permits     09/02/2023, 9:29 AM  Therisa MICAEL Stager, PhD, ANP-BC [facility]

## 2023-09-02 NOTE — Transfer of Care (Signed)
 Immediate Anesthesia Transfer of Care Note  Patient: [name]  Procedure(s) Performed: EGD (ESOPHAGOGASTRODUODENOSCOPY)  Patient Location: PACU  Anesthesia Type:General  Level of Consciousness: awake, alert , and oriented  Airway & Oxygen Therapy: Patient Spontanous Breathing and Patient connected to face mask oxygen  Post-op Assessment: Report given to RN and Post -op Vital signs reviewed and stable  Post vital signs: Reviewed and stable  Last Vitals:  Vitals Value Taken Time  BP 88/61 09/02/23 14:23  Temp    Pulse 85 09/02/23 14:26  Resp 16 09/02/23 14:26  SpO2 100 % 09/02/23 14:26  Vitals shown include unfiled device data.  Last Pain:  Vitals:   09/02/23 1402  TempSrc:   PainSc: 10-Worst pain ever         Complications: No notable events documented.

## 2023-09-02 NOTE — Brief Op Note (Signed)
 Patient underwent EGD  under propofol  sedation.  Tolerated the procedure adequately.   FINDINGS:  - Normal esophagus.   - Gastric bypass with a pouch 5 cm in length. Gastrojejunal anastomosis characterized by ulceration.  (Marginal ulcer)  - Very large cratered gastric anastomotic ulcer with an adherent clot ( Forrest Class IIb) . Injected.   - No specimens collected.  RECOMMENDATIONS  -Clear liquid diet today and advance tomorrow if no drop in HBG or no active overt bleeding encountered   -High dose PPI BID   -Avoid all NSAIDs   -Would recommend surveillance in 6- 8 weeks to ensure healing of this large lesion and exclude malignancy  - Protonix  40mg  twice daily , 30 min before breakfast and dinner for 3- 6 months   - Sucralfate  1gm , 4 times daily for atleast 3 months   Attempted to call patient daughter Viviane Corona- [phone]) few times but went to VM. A HIPAA complaint VM wasleft   Please recall GI with any further questions  Averi Kilty Faizan Brynna Dobos, MD Gastroenterology and Hepatology [facility] Gastroenterology

## 2023-09-02 NOTE — Consult Note (Signed)
 Gastroenterology Consult   Referring Provider: Dr. Luz  Primary Care Physician:  Terry Wilhelmena Lloyd Hilario, FNP Primary Gastroenterologist:  Dr. Shaaron   Patient ID: [name]; [id number]; [birth year]   Admit date: 09/01/2023  LOS: 1 day   Date of Consultation: 09/02/2023  Reason for Consultation:  acute blood loss anemia   History of Present Illness   [name] is a 78 y.o. year old female with a history of gastric bypass in 2007, IDA, anastomotic ulcer in 2016 and March 2021 with surveillance documented in 2021, GERD, chronic N/V, chronic constipation, adenomatous colon polyps due for surveillance colonoscopy in April 2025, chronic right sided abdominal pain with known right lateral abdominal wall hernia and right sided spigelian hernia,  atrial fibrillation status post Watchman no longer anticoagulated, CKD, dementia, depression, anxiety, presenting to the ED yesterday with reports of abdominal pain and black stool. Due to patient's history of dementia, history is limited. Daughter is not present at time of procedure but I was able to reach her by phone. Last seen in Dec 2024 by [facility] as outpatient.   In the ED: Hgb 8.5, previously 13 in April 2025. Hgb 7.8 this morning. Creatinine 1.26, close to baseline, BUN 51, lipase 37, unknown hemoccult status.  CTA yesterday: diffuse fat stranding in LUQ around site of GJ anastomosis, suspect active infectious/inflammatory process. No free air.    Today: Patient resting in bed without distress. No family at bedside. Due to cognitive deficits, unable to obtain thorough history. She is unable to report the year or her birth day. She is able to report she is in [place] and why she is here bleeding and abdominal pain. She lives with her daughter, who she states is the POA. Patient notes vague abdominal pain, pointing to upper abdomen. No dysphagia, N/V. Feels her reflux symptoms are worse. Outpatient med list includes BID PPI. No NSAIDs on  outpatient med list.   Spoke with daughter on phone: loose and black stool on Monday Takes Miralax  daily. Patient told daughter she had been seeing this for a few days. No oral iron or pepto. Daughter fills medication trays. No NSAIDs. Daughter states she has reported abdominal pain since having gastric bypass in 2007. She states patient has been out of pantoprazole  inadvertently since last week as somehow was misplaced and daughter thought she had been receiving. States mother has had decreased appetite over last few weeks.   Can call and leave message with daughter as she will be working today til 7pm. Jaileen Janelle, cell [phone]   Last EGD 12/01/2022: Normal esophagus s/p empiric dilation, small hiatal hernia, abnormal residual gastric mucosa s/p biopsy.  Pathology showed gastric oxyntic mucosa with changes consistent with PPI therapy, negative for H. pylori.   Colonoscopy April 2022: seven 4-7 mm polyps, diverticulosis, tubular adenomas, surveillance in 3 years.    EGD 06/13/2019:Prior gastric bypass surgery with Billroth II type anatomy, otherwise normal-appearing residual upper GI tract   March 2021 with abdominal pain status post EGD with 2 cm ulcer crater at the anastomosis with smaller satellite areas of ulceration in the setting of NSAID/Goody powders.    Past Medical History:  Diagnosis Date   Anemia    Anxiety    Arthritis    Chronic back pain    Spondylosis and stenosis   Depression    GERD (gastroesophageal reflux disease)    Headache(784.0)    Hiatal hernia    History of blood transfusion    History of  GI bleed    Hypothyroidism    Mitral regurgitation    Obsessive-compulsive disorder    Persistent atrial fibrillation (HCC)    Presence of Watchman left atrial appendage closure device 12/03/2021   31mm Watchman FLX with Dr. Wonda   Recurrent upper respiratory infection (URI)    Sleep apnea     Past Surgical History:  Procedure Laterality Date   ABDOMINAL  HYSTERECTOMY     ANTERIOR CERVICAL DECOMP/DISCECTOMY FUSION  12/09/2010   Procedure: ANTERIOR CERVICAL DECOMPRESSION/DISCECTOMY FUSION 3 LEVELS;  Surgeon: Reyes JONETTA Budge;  Location: [facility];  Service: Neurosurgery;  Laterality: N/A;  Cervical three-four,Cervical four-five,Cervical Five-Six,Cervical Six-Seven ANTERIOR CERVICAL DECOMPRESSION WITH FUSION INTERBODY PROTHESIS PLATING AND BONEGRAFT   APPENDECTOMY     BIOPSY N/A 11/14/2014   Procedure: GASTRIC BIOPSY;  Surgeon: Lamar CHRISTELLA Hollingshead, MD;  Location: [facility];  Service: Endoscopy;  Laterality: N/A;   CESAREAN SECTION     CHOLECYSTECTOMY     COLONOSCOPY WITH PROPOFOL  N/A 11/14/2014   RMR: redundant colon but otherwise normal   COLONOSCOPY WITH PROPOFOL  N/A 05/12/2020   Dr. Hollingshead;  Seven 4 to 7 mm polyps, diverticulosis in the sigmoid colon. Pathology with tubular adenomas. Recommended repeat in 3 years.   ESOPHAGOGASTRODUODENOSCOPY (EGD) WITH PROPOFOL  N/A 11/14/2014   RMRs/p gastric surgery/anastomtic ulcer   ESOPHAGOGASTRODUODENOSCOPY (EGD) WITH PROPOFOL  N/A 09/15/2017   Procedure: ESOPHAGOGASTRODUODENOSCOPY (EGD) WITH PROPOFOL ;  Surgeon: Hollingshead Lamar CHRISTELLA, MD;  Location: [facility];  Service: Endoscopy;  Laterality: N/A;  11:30am   ESOPHAGOGASTRODUODENOSCOPY (EGD) WITH PROPOFOL  N/A 04/04/2019   2 cm ulcer crater at anastomosis with smaller satellite areas of ulceration in setting of NSAIDs/Goody powders, no bleeding stigmata.   ESOPHAGOGASTRODUODENOSCOPY (EGD) WITH PROPOFOL  N/A 06/13/2019   Rourk: Prior gastric bypass surgery with Billroth II type anatomy, otherwise normal-appearing residual upper GI tract.   ESOPHAGOGASTRODUODENOSCOPY (EGD) WITH PROPOFOL  N/A 12/01/2022   Procedure: ESOPHAGOGASTRODUODENOSCOPY (EGD) WITH PROPOFOL ;  Surgeon: Hollingshead Lamar CHRISTELLA, MD;  Location: [facility];  Service: Endoscopy;  Laterality: N/A;  830am, asa 3   EYE SURGERY     bilateral cataracts removed, w/IOL   GASTRIC BYPASS  2008   JOINT REPLACEMENT      07/2010 &  2002- respectively- both knees    LEFT ATRIAL APPENDAGE OCCLUSION N/A 12/03/2021   Procedure: LEFT ATRIAL APPENDAGE OCCLUSION;  Surgeon: Wonda Sharper, MD;  Location: [facility] INVASIVE CV LAB;  Service: Cardiovascular;  Laterality: N/A;   MALONEY DILATION N/A 09/15/2017   Procedure: AGAPITO DILATION;  Surgeon: Hollingshead Lamar CHRISTELLA, MD;  Location: [facility];  Service: Endoscopy;  Laterality: N/A;   MALONEY DILATION N/A 12/01/2022   Procedure: AGAPITO DILATION;  Surgeon: Hollingshead Lamar CHRISTELLA, MD;  Location: [facility];  Service: Endoscopy;  Laterality: N/A;   PARATHYROIDECTOMY     PERIPHERALLY INSERTED CENTRAL CATHETER INSERTION     PICC line for treatment of MRSA   POLYPECTOMY  05/12/2020   Procedure: POLYPECTOMY;  Surgeon: Hollingshead Lamar CHRISTELLA, MD;  Location: [facility];  Service: Endoscopy;;   TEE WITHOUT CARDIOVERSION N/A 12/03/2021   Procedure: TRANSESOPHAGEAL ECHOCARDIOGRAM (TEE);  Surgeon: Wonda Sharper, MD;  Location: [facility] INVASIVE CV LAB;  Service: Cardiovascular;  Laterality: N/A;   TONSILLECTOMY     as a child    Prior to Admission medications   Medication Sig Start Date End Date Taking? Authorizing Provider  alendronate  (FOSAMAX ) 70 MG tablet Take 1 tablet (70 mg total) by mouth every 7 (seven) days. Take with a full glass  of water  on an empty stomach. 06/10/23   Del Wilhelmena Lloyd Sola, FNP  atenolol  (TENORMIN ) 50 MG tablet Take 1 tablet (50 mg total) by mouth daily. 12/03/22   Strader, Laymon HERO, PA-C  cyanocobalamin (VITAMIN B12) 1000 MCG/ML injection Inject 1,000 mcg into the muscle every 30 (thirty) days.    [provider]  diltiazem  (CARDIZEM  CD) 240 MG 24 hr capsule Take 1 capsule (240 mg total) by mouth daily. 09/09/22   Del Wilhelmena Lloyd Sola, FNP  donepezil  (ARICEPT ) 10 MG tablet Take 1 tablet (10 mg total) by mouth at bedtime. 08/18/23   Dohmeier, Dedra, MD  levothyroxine  (SYNTHROID , LEVOTHROID) 75 MCG tablet Take 75 mcg by mouth daily before breakfast.     [provider]  lidocaine  (LIDODERM ) 5 % PLACE 1 PATCH ONTO THE SKIN DAILY. REMOVE & DISCARD PATCH WITHIN 12 HOURS OR AS DIRECTED BY MD 08/23/23   Del Orbe Polanco, Iliana, FNP  ondansetron  (ZOFRAN ) 4 MG tablet Take 1 tablet (4 mg total) by mouth every 8 (eight) hours as needed for nausea or vomiting. 06/08/23   Rudy Josette RAMAN, PA-C  oxybutynin  (DITROPAN  XL) 15 MG 24 hr tablet Take 1 tablet (15 mg total) by mouth at bedtime. 05/12/23   Del Orbe Polanco, Iliana, FNP  pantoprazole  (PROTONIX ) 40 MG tablet Take 1 tablet (40 mg total) by mouth 2 (two) times daily. 08/31/23   Rudy Josette RAMAN, PA-C  penicillin v potassium (VEETID) 500 MG tablet Take 500 mg by mouth 3 (three) times daily. 07/13/23   [provider]  rosuvastatin  (CRESTOR ) 40 MG tablet Take 1 tablet (40 mg total) by mouth daily. 05/18/23   Del Orbe Polanco, Iliana, FNP  triamcinolone  cream (KENALOG ) 0.1 % Apply 1 Application topically 2 (two) times daily. 07/20/20   [provider]  venlafaxine  (EFFEXOR ) 75 MG tablet Take 75 mg by mouth 2 (two) times daily. 07/04/23   [provider]  Vitamin D , Ergocalciferol , (DRISDOL ) 1.25 MG (50000 UNIT) CAPS capsule Take 1 capsule (50,000 Units total) by mouth every Monday. 05/16/23   Del Wilhelmena Lloyd Sola, FNP    Current Facility-Administered Medications  Medication Dose Route Frequency Provider Last Rate Last Admin   0.9 %  sodium chloride  infusion   Intravenous Continuous Opyd, Timothy S, MD 75 mL/hr at 09/02/23 0803 Infusion Verify at 09/02/23 0803   acetaminophen  (TYLENOL ) tablet 650 mg  650 mg Oral Q6H PRN Opyd, Timothy S, MD       Or   acetaminophen  (TYLENOL ) suppository 650 mg  650 mg Rectal Q6H PRN Opyd, Evalene RAMAN, MD       atenolol  (TENORMIN ) tablet 50 mg  50 mg Oral Daily Opyd, Timothy S, MD   50 mg at 09/02/23 9193   Chlorhexidine  Gluconate Cloth 2 % PADS 6 each  6 each Topical Daily Sira, Zackery, MD   6 each at 09/02/23 0813   diltiazem  (CARDIZEM  CD)  24 hr capsule 240 mg  240 mg Oral Daily Opyd, Timothy S, MD   240 mg at 09/02/23 9192   donepezil  (ARICEPT ) tablet 10 mg  10 mg Oral QHS Opyd, Timothy S, MD   10 mg at 09/01/23 2259   fentaNYL  (SUBLIMAZE ) injection 12.5-50 mcg  12.5-50 mcg Intravenous Q2H PRN Opyd, Timothy S, MD   50 mcg at 09/01/23 2259   iohexol  (OMNIPAQUE ) 350 MG/ML injection 80 mL  80 mL Intravenous Once PRN Sira, Zackery, MD       levothyroxine  (SYNTHROID ) tablet 75 mcg  75  mcg Oral Q0600 Opyd, Timothy S, MD   75 mcg at 09/02/23 9475   oxybutynin  (DITROPAN -XL) 24 hr tablet 15 mg  15 mg Oral QHS Opyd, Timothy S, MD   15 mg at 09/01/23 2259   oxyCODONE  (Oxy IR/ROXICODONE ) immediate release tablet 2.5-5 mg  2.5-5 mg Oral Q4H PRN Opyd, Timothy S, MD   5 mg at 09/02/23 9687   pantoprazole  (PROTONIX ) injection 40 mg  40 mg Intravenous Q12H Opyd, Timothy S, MD   40 mg at 09/02/23 9474   piperacillin -tazobactam (ZOSYN ) IVPB 3.375 g  3.375 g Intravenous Q8H [place] Agent, [facility]   Stopped at 09/02/23 9241   prochlorperazine  (COMPAZINE ) injection 5 mg  5 mg Intravenous Q6H PRN Opyd, Timothy S, MD       rosuvastatin  (CRESTOR ) tablet 40 mg  40 mg Oral Daily Opyd, Timothy S, MD   40 mg at 09/02/23 9193   sodium chloride  flush (NS) 0.9 % injection 3 mL  3 mL Intravenous Q12H Opyd, Timothy S, MD   3 mL at 09/02/23 0813   venlafaxine  (EFFEXOR ) tablet 75 mg  75 mg Oral BID Charlton Evalene RAMAN, MD   75 mg at 09/02/23 0806    Allergies as of 09/01/2023 - Review Complete 09/01/2023  Allergen Reaction Noted   Benzodiazepines Other (See Comments) 12/03/2011   Nsaids Other (See Comments) 12/22/2012   Statins Nausea And Vomiting    Codeine Nausea Only    Escitalopram oxalate Nausea Only 12/02/2010   Sertraline  hcl Nausea Only    Tramadol hcl Other (See Comments) 12/22/2012    Family History  Problem Relation Age of Onset   Anxiety disorder Mother    Breast cancer Mother    Heart attack Father    Anxiety disorder Sister    Breast cancer Sister     Bipolar disorder Daughter    OCD Daughter    Anesthesia problems Neg Hx    Hypotension Neg Hx    Malignant hyperthermia Neg Hx    Pseudochol deficiency Neg Hx    Dementia Neg Hx    Alcohol abuse Neg Hx    Drug abuse Neg Hx    Depression Neg Hx    Paranoid behavior Neg Hx    Schizophrenia Neg Hx    Seizures Neg Hx    Sexual abuse Neg Hx    Physical abuse Neg Hx    Colon cancer Neg Hx     Social History   Socioeconomic History   Marital status: Widowed    Spouse name: Not on file   Number of children: 4   Years of education: Not on file   Highest education level: Not on file  Occupational History   Not on file  Tobacco Use   Smoking status: Former    Current packs/day: 0.00    Average packs/day: 1 pack/day for 30.0 years (30.0 ttl pk-yrs)    Types: Cigarettes    Start date: 01/19/1968    Quit date: 01/18/1998    Years since quitting: 25.6   Smokeless tobacco: Never  Vaping Use   Vaping status: Never Used  Substance and Sexual Activity   Alcohol use: No   Drug use: No   Sexual activity: Not on file    Comment: Hysterectomy  Other Topics Concern   Not on file  Social History Narrative   Not on file   Social Drivers of Health   Financial Resource Strain: Low Risk  (12/15/2022)   Overall Financial Resource Strain (CARDIA)  Difficulty of Paying Living Expenses: Not hard at all  Food Insecurity: No Food Insecurity (09/02/2023)   Hunger Vital Sign    Worried About Running Out of Food in the Last Year: Never true    Ran Out of Food in the Last Year: Never true  Transportation Needs: No Transportation Needs (09/02/2023)   PRAPARE - Administrator, Civil Service (Medical): No    Lack of Transportation (Non-Medical): No  Physical Activity: Inactive (12/15/2022)   Exercise Vital Sign    Days of Exercise per Week: 0 days    Minutes of Exercise per Session: 0 min  Stress: No Stress Concern Present (12/15/2022)   [employer] of Occupational Health -  Occupational Stress Questionnaire    Feeling of Stress : Not at all  Social Connections: Moderately Isolated (09/02/2023)   Social Connection and Isolation Panel    Frequency of Communication with Friends and Family: More than three times a week    Frequency of Social Gatherings with Friends and Family: More than three times a week    Attends Religious Services: More than 4 times per year    Active Member of [employer] or Organizations: No    Attends Banker Meetings: Never    Marital Status: Widowed  Intimate Partner Violence: Not At Risk (09/02/2023)   Humiliation, Afraid, Rape, and Kick questionnaire    Fear of Current or Ex-Partner: No    Emotionally Abused: No    Physically Abused: No    Sexually Abused: No     Review of Systems   Limited due to cognitive status. See HPI  Physical Exam   Vital Signs in last 24 hours: Temp:  [98.3 F (36.8 C)-98.5 F (36.9 C)] 98.3 F (36.8 C) (08/15 0802) Pulse Rate:  [61-100] 92 (08/15 0900) Resp:  [9-22] 18 (08/15 0900) BP: (104-159)/(61-93) 139/74 (08/15 0900) SpO2:  [80 %-100 %] 95 % (08/15 0900) Weight:  [68 kg-69.1 kg] 69.1 kg (08/15 0802) Last BM Date : 08/30/23  General:   Alert,  Well-developed, well-nourished, pleasant and cooperative in NAD Head:  Normocephalic and atraumatic. Eyes:  Sclera clear, no icterus.    Ears:  Normal auditory acuity. Lungs:  Clear throughout to auscultation.    Heart:  S1 S2 present, irregularly irregular, moderate systolic murmur Abdomen:  Soft, TTP upper abdomen and LUQ and nondistended. No masses, hepatosplenomegaly. Normal bowel sounds, no guarding or rebound Rectal: deferred   Msk:  Symmetrical without gross deformities. Normal posture. Extremities:  Without edema. Neurologic:  Alert and  oriented to person and place   Intake/Output from previous day: 08/14 0701 - 08/15 0700 In: 1042 [IV Piggyback:1042] Out: -  Intake/Output this shift: Total I/O In: 662.9 [I.V.:633.5; IV  Piggyback:29.4] Out: -    Labs/Studies   Recent Labs Recent Labs    09/01/23 1525 09/01/23 2251 09/02/23 0305  WBC 10.1 9.3 9.9  HGB 8.5* 8.0* 7.8*  HCT 26.1* 24.5* 24.4*  PLT 141* 135* 141*   BMET Recent Labs    09/01/23 1525 09/02/23 0305  NA 137 137  K 4.1 4.2  CL 101 106  CO2 26 25  GLUCOSE 121* 93  BUN 51* 39*  CREATININE 1.26* 1.22*  CALCIUM  8.5* 8.2*   LFT Recent Labs    09/01/23 1525  PROT 5.8*  ALBUMIN 2.9*  AST 18  ALT 17  ALKPHOS 44  BILITOT 0.6     Radiology/Studies CT ANGIO GI BLEED Result Date: 09/01/2023 CLINICAL DATA:  GI  bleed EXAM: CTA ABDOMEN AND PELVIS WITHOUT AND WITH CONTRAST TECHNIQUE: Multidetector CT imaging of the abdomen and pelvis was performed using the standard protocol during bolus administration of intravenous contrast. Multiplanar reconstructed images and MIPs were obtained and reviewed to evaluate the vascular anatomy. RADIATION DOSE REDUCTION: This exam was performed according to the departmental dose-optimization program which includes automated exposure control, adjustment of the mA and/or kV according to patient size and/or use of iterative reconstruction technique. CONTRAST:  80mL OMNIPAQUE  IOHEXOL  350 MG/ML SOLN COMPARISON:  CT abdomen pelvis October 16, 2022, and July 12, 2021. FINDINGS: VASCULAR Aorta: Normal caliber aorta without aneurysm, dissection, vasculitis or significant stenosis. Celiac: Patent without evidence of aneurysm, dissection, vasculitis or significant stenosis. SMA: Patent without evidence of aneurysm, dissection, vasculitis or significant stenosis. Renals: Both renal arteries are patent without evidence of aneurysm, dissection, vasculitis, fibromuscular dysplasia or significant stenosis. IMA: Patent without evidence of aneurysm, dissection, vasculitis or significant stenosis. Inflow: Patent without evidence of aneurysm, dissection, vasculitis or significant stenosis. Proximal Outflow: Bilateral common femoral  and visualized portions of the superficial and profunda femoral arteries are patent without evidence of aneurysm, dissection, vasculitis or significant stenosis. Veins: No obvious venous abnormality within the limitations of this arterial phase study. Review of the MIP images confirms the above findings. NON-VASCULAR Lower chest: Cardiomegaly. Aortic annulus and coronary artery calcification. Partially visualized mitral leaflet calcifications. Hepatobiliary: Cholecystectomy. No biliary dilatation. Punctate right hepatic lobe calcification. Liver appears normal without focal enhancing lesion. Pancreas: Atrophic changes of the pancreas. Spleen: Normal in size without focal abnormality. Punctate calcifications suggestive of prior granulomatous disease. Adrenals/Urinary Tract: Adrenal glands are unremarkable. Kidneys are normal, without renal calculi, focal lesion, or hydronephrosis. Bladder is unremarkable. Stomach/Bowel: Status post Roux-en-Y gastric bypass. There is omental/peritoneal fat stranding along the surgical anastomosis involving the left upper quadrant (9/79-81). Multiple prominent and enhancing mesenteric lymph nodes are identified measuring up to 1.2 cm. (9/90, 88). These findings are similar to prior exam from July 12, 2021 and new to prior exam from 2024. No bowel obstruction or active site of bleeding. Large stool identified throughout the colon. Submucosal fat along the terminal ileum suggestive of chronic colitis. Appendectomy.  No fluid collection or free gas. Lymphatic: No significant vascular findings are present. No enlarged abdominal or pelvic lymph nodes. Reproductive: Hysterectomy.  No adnexal mass. Other: No abdominal wall hernia or abnormality. No abdominopelvic ascites. Musculoskeletal: Degenerative changes of the spine. Posterior spinal fusion device L4-L5 and inter spinous cage. IMPRESSION: VASCULAR Atherosclerotic calcifications, otherwise unremarkable. No definite finding to suggest  active bleeding. NON-VASCULAR 1. Diffuse fat stranding in left upper quadrant around the site of gastrojejunostomy bypass anastomosis, and multiple mesenteric root enhancing lymph nodes/misty mesentery. No definite site of active bleeding, perianastomotic fluid collection or free air . Findings are likely suggestive of active infectious/inflammatory process and similar to prior exam from July 12, 2021. Recommend correlation with endoscopy and clinical findings. 2. Submucosal fat along the terminal ileum suggestive of chronic inflammatory changes. Electronically Signed   By: Megan  Zare M.D.   On: 09/01/2023 20:17     Assessment   [name] is a 78 y.o. year old female with a history of gastric bypass in 2007, IDA, anastomotic ulcer in 2016 due to NSAIDs and documented healing in 2021, GERD, chronic N/V, chronic constipation, adenomatous colon polyps, chronic right sided abdominal pain with known right lateral abdominal wall hernia and right sided spigelian hernia,  atrial fibrillation status post Watchman no longer anticoagulated, CKD, dementia,  depression, anxiety, presenting to the ED yesterday with reports of abdominal pain and black stool. Due to patient's history of dementia, history is limited, and majority of information was obtained via telephone by daughter, [name]. GI now consulted due to concerns for acute GI bleed.   Acute anemia with reports of melena: Hgb 8.5 on presentation, down about 4-5 grams from prior in April 2025, currently 7.8 this morning but without melena since admission. Reported black stool earlier this week and also witnessed by daughter. CTA with diffuse fat stranding in LUQ around site of GJ anastomosis, multiple enhancing lymph nodes and misty mesentery but no obvious fluid collection or free air. Suspect recurrent ulceration at GJ anastomosis. She has been without BID PPI for about a week inadvertently; daughter denies any NSAID use. Physical exam without concerns  for acute abdomen today. Plans for diagnostic EGD today. I obtained consent from daughter via telephone.   Abdominal pain: acute on chronic, located upper abdomen/LUQ. Noted to have abdominal pain well-documented since gastric bypass in 2007, no peritoneal signs on exam today, suspect r/t anastomotic findings. If ongoing abdominal pain as outpatient, would recommend evaluating for internal hernia although this could be difficult to document via CT if intermittent. Mesenteric vasculature is patent.   Hx of multiple polyps in 2022: can consider outpatient surveillance once over acute illness.   [name], daughter (cell [phone]) is aware of plans and has given consent via telephone. She will be working till 7pm today but requests we leave message on voicemail if unable to contact.       Plan / Recommendations    Remain NPO EGD today by Dr. Cinderella: I spoke with patient and daughter regarding risks and benefits. Daughter provided telephone consent PPI BID Further recommendations to follow Can consider outpatient elective surveillance colonoscopy if health permits     09/02/2023, 9:29 AM  Therisa MICAEL Stager, PhD, ANP-BC [facility] Gastroenter[facility]

## 2023-09-02 NOTE — Interval H&P Note (Signed)
 History and Physical Interval Note:  09/02/2023 1:47 PM  [name]  has presented today for surgery, with the diagnosis of Acute post hemorrhagic anemia, melena, abnormal CT.  The various methods of treatment have been discussed with the patient and family. After consideration of risks, benefits and other options for treatment, the patient has consented to  Procedure(s): EGD (ESOPHAGOGASTRODUODENOSCOPY) (N/A) as a surgical intervention.  The patient's history has been reviewed, patient examined, no change in status, stable for surgery.  I have reviewed the patient's chart and labs.  Questions were answered to the patient's satisfaction.     Deatrice FALCON Akshita Italiano

## 2023-09-03 DIAGNOSIS — K922 Gastrointestinal hemorrhage, unspecified: Secondary | ICD-10-CM | POA: Diagnosis not present

## 2023-09-03 LAB — CBC
HCT: 22 % — ABNORMAL LOW (ref 36.0–46.0)
HCT: 27.6 % — ABNORMAL LOW (ref 36.0–46.0)
Hemoglobin: 7.1 g/dL — ABNORMAL LOW (ref 12.0–15.0)
Hemoglobin: 8.9 g/dL — ABNORMAL LOW (ref 12.0–15.0)
MCH: 32.9 pg (ref 26.0–34.0)
MCH: 32.6 pg (ref 26.0–34.0)
MCHC: 32.3 g/dL (ref 30.0–36.0)
MCHC: 32.2 g/dL (ref 30.0–36.0)
MCV: 101.9 fL — ABNORMAL HIGH (ref 80.0–100.0)
MCV: 101.1 fL — ABNORMAL HIGH (ref 80.0–100.0)
Platelets: 144 K/uL — ABNORMAL LOW (ref 150–400)
Platelets: 161 K/uL (ref 150–400)
RBC: 2.16 MIL/uL — ABNORMAL LOW (ref 3.87–5.11)
RBC: 2.73 MIL/uL — ABNORMAL LOW (ref 3.87–5.11)
RDW: 13.2 % (ref 11.5–15.5)
RDW: 14.1 % (ref 11.5–15.5)
WBC: 6.1 K/uL (ref 4.0–10.5)
WBC: 6.4 K/uL (ref 4.0–10.5)
nRBC: 0 % (ref 0.0–0.2)
nRBC: 0 % (ref 0.0–0.2)

## 2023-09-03 LAB — BASIC METABOLIC PANEL WITH GFR
Anion gap: 4 — ABNORMAL LOW (ref 5–15)
BUN: 30 mg/dL — ABNORMAL HIGH (ref 8–23)
CO2: 25 mmol/L (ref 22–32)
Calcium: 7.8 mg/dL — ABNORMAL LOW (ref 8.9–10.3)
Chloride: 108 mmol/L (ref 98–111)
Creatinine, Ser: 1.35 mg/dL — ABNORMAL HIGH (ref 0.44–1.00)
GFR, Estimated: 41 mL/min — ABNORMAL LOW (ref >60)
Glucose, Bld: 93 mg/dL (ref 70–99)
Potassium: 4.3 mmol/L (ref 3.5–5.1)
Sodium: 137 mmol/L (ref 135–145)

## 2023-09-03 LAB — PREPARE RBC (CROSSMATCH)

## 2023-09-03 LAB — FERRITIN: Ferritin: 60 ng/mL (ref 11–307)

## 2023-09-03 LAB — IRON AND TIBC
Iron: 33 ug/dL (ref 28–170)
Saturation Ratios: 13 % (ref 10.4–31.8)
TIBC: 245 ug/dL — ABNORMAL LOW (ref 250–450)
UIBC: 212 ug/dL

## 2023-09-03 LAB — FOLATE: Folate: 9.9 ng/mL (ref >5.9)

## 2023-09-03 LAB — VITAMIN B12: Vitamin B-12: 243 pg/mL (ref 180–914)

## 2023-09-03 LAB — PHOSPHORUS: Phosphorus: 1.8 mg/dL — ABNORMAL LOW (ref 2.5–4.6)

## 2023-09-03 LAB — C-REACTIVE PROTEIN: CRP: 1.8 mg/dL — ABNORMAL HIGH (ref <1.0)

## 2023-09-03 LAB — MAGNESIUM: Magnesium: 2 mg/dL (ref 1.7–2.4)

## 2023-09-03 MED ORDER — SODIUM CHLORIDE 0.9 % IV SOLN: INTRAVENOUS | Status: DC

## 2023-09-03 MED ORDER — METHOCARBAMOL 500 MG PO TABS
500.0000 mg | ORAL_TABLET | Freq: Three times a day (TID) | ORAL | Status: DC | PRN
  Administered 2023-09-03: 500 mg via ORAL
  Filled 2023-09-03: qty 1

## 2023-09-03 MED ORDER — SODIUM CHLORIDE 0.9 % IV BOLUS
1000.0000 mL | Freq: Once | INTRAVENOUS | Status: AC
  Administered 2023-09-03: 1000 mL via INTRAVENOUS

## 2023-09-03 MED ORDER — SODIUM PHOSPHATES 45 MMOLE/15ML IV SOLN
30.0000 mmol | Freq: Once | INTRAVENOUS | Status: AC
  Administered 2023-09-03: 30 mmol via INTRAVENOUS
  Filled 2023-09-03: qty 10

## 2023-09-03 MED ORDER — SODIUM CHLORIDE 0.9% IV SOLUTION: Freq: Once | INTRAVENOUS | Status: AC

## 2023-09-03 MED ORDER — POTASSIUM PHOSPHATES 15 MMOLE/5ML IV SOLN: 30.0000 mmol | Freq: Once | INTRAVENOUS | Status: DC

## 2023-09-03 MED ORDER — ACETAMINOPHEN 10 MG/ML IV SOLN
1000.0000 mg | Freq: Once | INTRAVENOUS | Status: AC
  Administered 2023-09-03: 1000 mg via INTRAVENOUS
  Filled 2023-09-03 (×2): qty 100

## 2023-09-03 NOTE — Progress Notes (Signed)
  Progress Note   Patient: [name] FMW:[id number] DOB: 01-25-1945 DOA: 09/01/2023     2 DOS: the patient was seen and examined on 09/03/2023    Assessment and Plan:  ABLA due to suspected GI bleed & peritoneal fat stranding along the surgical anastomosis involving the left upper quadrant - GI performed EGD 09/02/2023 --> Very large cratered gastric anastomotic ulcer with an adherent clot ( Forrest Class IIb) . Injected.  - 1 unit PRBC 09/03/2023 as Hgb still 7.1. - IV zosyn  3.375 g q8hr  - Compazine  5 mg q6 hr PRN - IV protonix  40 mg q12  - Carafate  1g PO ACHS - Clear liquids - IV NS 75 cc/hr  - IV fentanyl  q2 hr PRN  - Oxycodone  PO q4 hr PRN  - B12/folate/ferritin/iron are all wnl   CKD3a - Kidney function improving - Continue IV fluids as above    Afib - Atenolol  50 mg PO daily  - Diltiazem  24 hr 240 mg PO daily    Hypothyroidism  - Synthroid  75 mcg PO daily    Dementia  - Aricept  10 mg PO at bedtime    Depression/anxiety  - Effexor  75 mg PO bid   Subjective: Pt seen and examined at the bedside. Hgb 7.1 this morning and she will receive 1 unit PRBC today. Continue with clears for now. Continue IV fluids as kidney function is improving.  Physical Exam: Vitals:   09/03/23 0427 09/03/23 0759 09/03/23 0927 09/03/23 0928  BP:   115/69 115/69  Pulse:    76  Resp:      Temp: 97.8 F (36.6 C) 98.3 F (36.8 C)    TempSrc: Oral Oral    SpO2:      Weight:      Height:       HENT:     Head: Normocephalic.  Cardiovascular:     Rate and Rhythm: Normal rate.  Pulmonary:     Effort: Pulmonary effort is normal.  Abdominal:     Palpations: Abdomen is soft.  Skin:    General: Skin is warm.  Neurological:     Mental Status: She is alert.  Psychiatric:        Mood and Affect: Mood normal.     Disposition: Status is: Inpatient Remains inpatient appropriate because: Blood administration and IV fluids/IV antibx  Planned Discharge Destination: Barriers to discharge:  As above     Time spent: 35 minutes  Author: Seth Higginbotham , MD 09/03/2023 10:05 AM  For on call review [email or web address].

## 2023-09-03 NOTE — Plan of Care (Signed)
  Problem: Nutrition: Goal: Adequate nutrition will be maintained Outcome: Progressing   Problem: Coping: Goal: Level of anxiety will decrease Outcome: Progressing   Problem: Pain Managment: Goal: General experience of comfort will improve and/or be controlled Outcome: Progressing

## 2023-09-04 DIAGNOSIS — K922 Gastrointestinal hemorrhage, unspecified: Secondary | ICD-10-CM | POA: Diagnosis not present

## 2023-09-04 LAB — COMPREHENSIVE METABOLIC PANEL WITH GFR
ALT: 29 U/L (ref 0–44)
AST: 48 U/L — ABNORMAL HIGH (ref 15–41)
Albumin: 2.5 g/dL — ABNORMAL LOW (ref 3.5–5.0)
Alkaline Phosphatase: 98 U/L (ref 38–126)
Anion gap: 4 — ABNORMAL LOW (ref 5–15)
BUN: 18 mg/dL (ref 8–23)
CO2: 25 mmol/L (ref 22–32)
Calcium: 7.7 mg/dL — ABNORMAL LOW (ref 8.9–10.3)
Chloride: 112 mmol/L — ABNORMAL HIGH (ref 98–111)
Creatinine, Ser: 1.25 mg/dL — ABNORMAL HIGH (ref 0.44–1.00)
GFR, Estimated: 44 mL/min — ABNORMAL LOW (ref >60)
Glucose, Bld: 90 mg/dL (ref 70–99)
Potassium: 4 mmol/L (ref 3.5–5.1)
Sodium: 141 mmol/L (ref 135–145)
Total Bilirubin: 0.3 mg/dL (ref 0.0–1.2)
Total Protein: 5.3 g/dL — ABNORMAL LOW (ref 6.5–8.1)

## 2023-09-04 LAB — CBC
HCT: 29.2 % — ABNORMAL LOW (ref 36.0–46.0)
Hemoglobin: 9.4 g/dL — ABNORMAL LOW (ref 12.0–15.0)
MCH: 32.1 pg (ref 26.0–34.0)
MCHC: 32.2 g/dL (ref 30.0–36.0)
MCV: 99.7 fL (ref 80.0–100.0)
Platelets: 175 K/uL (ref 150–400)
RBC: 2.93 MIL/uL — ABNORMAL LOW (ref 3.87–5.11)
RDW: 14.4 % (ref 11.5–15.5)
WBC: 7.1 K/uL (ref 4.0–10.5)
nRBC: 0.3 % — ABNORMAL HIGH (ref 0.0–0.2)

## 2023-09-04 LAB — TYPE AND SCREEN
ABO/RH(D): A POS
Antibody Screen: NEGATIVE
Unit division: 0

## 2023-09-04 LAB — PHOSPHORUS: Phosphorus: 2.5 mg/dL (ref 2.5–4.6)

## 2023-09-04 LAB — MAGNESIUM: Magnesium: 2 mg/dL (ref 1.7–2.4)

## 2023-09-04 LAB — BPAM RBC
Blood Product Expiration Date: 202509062359
ISSUE DATE / TIME: 202508161047
Unit Type and Rh: 6200

## 2023-09-04 LAB — HEMOGLOBIN A1C
Hgb A1c MFr Bld: 5.6 % (ref 4.8–5.6)
Mean Plasma Glucose: 114.02 mg/dL

## 2023-09-04 LAB — C-REACTIVE PROTEIN: CRP: 1.3 mg/dL — ABNORMAL HIGH (ref <1.0)

## 2023-09-04 MED ORDER — DOCUSATE SODIUM 100 MG PO CAPS
100.0000 mg | ORAL_CAPSULE | Freq: Two times a day (BID) | ORAL | Status: DC
  Administered 2023-09-04 – 2023-09-06 (×6): 100 mg via ORAL
  Filled 2023-09-04 (×6): qty 1

## 2023-09-04 MED ORDER — MORPHINE SULFATE (PF) 2 MG/ML IV SOLN
2.0000 mg | INTRAVENOUS | Status: DC | PRN
  Administered 2023-09-04 – 2023-09-05 (×2): 2 mg via INTRAVENOUS
  Filled 2023-09-04 (×2): qty 1

## 2023-09-04 NOTE — Plan of Care (Signed)

## 2023-09-04 NOTE — Plan of Care (Signed)
  Problem: Nutrition: Goal: Adequate nutrition will be maintained Outcome: Progressing   Problem: Coping: Goal: Level of anxiety will decrease Outcome: Progressing   Problem: Pain Managment: Goal: General experience of comfort will improve and/or be controlled Outcome: Progressing

## 2023-09-04 NOTE — TOC CM/SW Note (Signed)
 Spoke c/pt's daughter today and she states that pt has gotten so weak and is requiring more assistance c/transfers and ambulation than she is able to provide. Prior to this episode daughter states that pt was pretty much independent. PT eval pending for potential SNF placement for short-term rehab. PASRR request submitted. FL2 started.

## 2023-09-04 NOTE — Progress Notes (Signed)
  Progress Note   Patient: [name] FMW:[id number] DOB: [birth year] DOA: 09/01/2023     3 DOS: the patient was seen and examined on 09/04/2023    Assessment and Plan:  ABLA due to suspected GI bleed & peritoneal fat stranding along the surgical anastomosis involving the left upper quadrant - GI performed EGD 09/02/2023 --> Very large cratered gastric anastomotic ulcer with an adherent clot ( Forrest Class IIb) . Injected.  - s/p 1 unit PRBC 09/03/2023   - IV zosyn  3.375 g q8hr  - Compazine  5 mg q6 hr PRN - IV protonix  40 mg q12  - Carafate  1g PO ACHS - Regular diet  - IV NS 75 cc/hr  - B12/folate/ferritin/iron are all wnl   CKD3a - Kidney function improving - Continue IV fluids as above    Afib - Diltiazem  24 hr 240 mg PO daily    Hypothyroidism  - Synthroid  75 mcg PO daily    Dementia  - Aricept  10 mg PO at bedtime    Depression/anxiety  - Effexor  75 mg PO bid   Subjective: Pt seen and examined at the bedside. Hgb improved 8.9 -->9.4. Diet advanced to regular today. Kidney function improving with IV fluids.Bradycardia resolved after removing the atenolol . Continue with cardizem .  Physical Exam: Vitals:   09/04/23 0600 09/04/23 0700 09/04/23 0746 09/04/23 0800  BP: (!) 149/69   (!) 140/75  Pulse: 79 82  67  Resp: 15 17  12   Temp:   98.1 F (36.7 C)   TempSrc:   Oral   SpO2: 99% 100%  100%  Weight:      Height:       Physical Exam HENT:     Head: Normocephalic.  Cardiovascular:     Rate and Rhythm: Normal rate.  Pulmonary:     Effort: Pulmonary effort is normal.  Abdominal:     General: Abdomen is flat.     Palpations: Abdomen is soft.  Skin:    General: Skin is warm.  Neurological:     Mental Status: She is alert.  Psychiatric:        Mood and Affect: Mood normal.      Disposition: Status is: Inpatient Remains inpatient appropriate because: IV fluids and IV antibx. Diet advancement   Planned Discharge Destination: Barriers to discharge: As above      Time spent: 35 minutes  Author: Jeric Slagel , MD 09/04/2023 8:41 AM  For on call review [email or web address].

## 2023-09-05 ENCOUNTER — Ambulatory Visit: Admitting: Gastroenterology

## 2023-09-05 ENCOUNTER — Telehealth: Payer: Self-pay | Admitting: Gastroenterology

## 2023-09-05 ENCOUNTER — Ambulatory Visit: Admitting: Nurse Practitioner

## 2023-09-05 DIAGNOSIS — K922 Gastrointestinal hemorrhage, unspecified: Secondary | ICD-10-CM | POA: Diagnosis not present

## 2023-09-05 LAB — CBC
HCT: 27.9 % — ABNORMAL LOW (ref 36.0–46.0)
Hemoglobin: 8.8 g/dL — ABNORMAL LOW (ref 12.0–15.0)
MCH: 32.2 pg (ref 26.0–34.0)
MCHC: 31.5 g/dL (ref 30.0–36.0)
MCV: 102.2 fL — ABNORMAL HIGH (ref 80.0–100.0)
Platelets: 207 K/uL (ref 150–400)
RBC: 2.73 MIL/uL — ABNORMAL LOW (ref 3.87–5.11)
RDW: 14.3 % (ref 11.5–15.5)
WBC: 8.9 K/uL (ref 4.0–10.5)
nRBC: 0.2 % (ref 0.0–0.2)

## 2023-09-05 LAB — COMPREHENSIVE METABOLIC PANEL WITH GFR
ALT: 27 U/L (ref 0–44)
AST: 51 U/L — ABNORMAL HIGH (ref 15–41)
Albumin: 2.3 g/dL — ABNORMAL LOW (ref 3.5–5.0)
Alkaline Phosphatase: 96 U/L (ref 38–126)
Anion gap: 6 (ref 5–15)
BUN: 17 mg/dL (ref 8–23)
CO2: 21 mmol/L — ABNORMAL LOW (ref 22–32)
Calcium: 7.5 mg/dL — ABNORMAL LOW (ref 8.9–10.3)
Chloride: 112 mmol/L — ABNORMAL HIGH (ref 98–111)
Creatinine, Ser: 1.3 mg/dL — ABNORMAL HIGH (ref 0.44–1.00)
GFR, Estimated: 42 mL/min — ABNORMAL LOW (ref >60)
Glucose, Bld: 83 mg/dL (ref 70–99)
Potassium: 3.6 mmol/L (ref 3.5–5.1)
Sodium: 139 mmol/L (ref 135–145)
Total Bilirubin: <0.2 mg/dL (ref 0.0–1.2)
Total Protein: 5 g/dL — ABNORMAL LOW (ref 6.5–8.1)

## 2023-09-05 LAB — PHOSPHORUS: Phosphorus: 1.7 mg/dL — ABNORMAL LOW (ref 2.5–4.6)

## 2023-09-05 LAB — C-REACTIVE PROTEIN: CRP: 0.8 mg/dL (ref <1.0)

## 2023-09-05 LAB — MAGNESIUM: Magnesium: 2.1 mg/dL (ref 1.7–2.4)

## 2023-09-05 MED ORDER — DILTIAZEM HCL ER COATED BEADS 180 MG PO CP24
180.0000 mg | ORAL_CAPSULE | Freq: Every day | ORAL | Status: DC
  Administered 2023-09-06: 180 mg via ORAL
  Filled 2023-09-05: qty 1

## 2023-09-05 MED ORDER — POTASSIUM PHOSPHATES 15 MMOLE/5ML IV SOLN
30.0000 mmol | Freq: Once | INTRAVENOUS | Status: AC
  Administered 2023-09-05: 30 mmol via INTRAVENOUS
  Filled 2023-09-05: qty 10

## 2023-09-05 NOTE — Evaluation (Addendum)
 Physical Therapy Evaluation Patient Details Name: [name] MRN: [id number] DOB: [birth year] Today's Date: 09/05/2023  History of Present Illness  Patient presents with: Fatigue and Abdominal Pain        [name] is a 78 y.o. female.        Patient has had abdominal pain since Sunday and had some black stool Sunday.  She is complaining of abdominal pain now.  Patient had gastric bypass surgery years ago and history of ulcers. [name]  has presented today for surgery, with the diagnosis of Acute post hemorrhagic anemia, melena, abnormal CT.  The various methods of treatment have been discussed with the patient and family. After consideration of risks, benefits and other options for treatment, the patient has consented to  Procedure(s):  EGD (ESOPHAGOGASTRODUODENOSCOPY) (N/A) as a surgical intervention.   Clinical Impression  Patient agreeable to PT evaluation. Received supine in bed. Pt required mod assist for all bed mobility, functional transfers, and very short ambulation/steps at bedside with AD and HHA. Pt able to donn socks independently seated EOB. Pt limited most due to pain in abdomen and fatigue. Pt demo limited UE ROM and strength, as well as dec LE strength. Once seated in recliner pt reports inc nausea and feeling like she's trembling. Pt did not tolerate sitting in recliner, requested back to bed. Once back in bed, pt reports improvements of symptoms. Nursing staff present at EOS. Family is not present to determine PLOF as pt with history of dementia. Patient will benefit from continued skilled physical therapy acutely and in recommended venue in order to address current deficits.        If plan is discharge home, recommend the following: A lot of help with walking and/or transfers;A lot of help with bathing/dressing/bathroom;Assist for transportation;Help with stairs or ramp for entrance;Supervision due to cognitive status   Can travel by private vehicle        Equipment  Recommendations None recommended by PT  Recommendations for Other Services       Functional Status Assessment Patient has had a recent decline in their functional status and demonstrates the ability to make significant improvements in function in a reasonable and predictable amount of time.     Precautions / Restrictions Precautions Precautions: Fall Recall of Precautions/Restrictions: Impaired Restrictions Weight Bearing Restrictions Per Provider Order: No      Mobility  Bed Mobility Overal bed mobility: Needs Assistance Bed Mobility: Supine to Sit, Sit to Supine     Supine to sit: Mod assist Sit to supine: Mod assist   General bed mobility comments: HOB flat, min/mod assist due to pain and general weakness. Assisted with LE handling and trunk elevation. Pt demo very slow labored movement    Transfers Overall transfer level: Needs assistance Equipment used: Quad cane, 1 person hand held assist Transfers: Sit to/from Stand, Bed to chair/wheelchair/BSC Sit to Stand: Mod assist   Step pivot transfers: Mod assist       General transfer comment: Demo slow labored movement throughout. Used quad cane in LUE during bed>chair and UE support of arm rest with other UE. During chair>bed, QC in RUE and hand held assist with other UE.    Ambulation/Gait Ambulation/Gait assistance: Mod assist Gait Distance (Feet): 4 Feet Assistive device: Quad cane, 1 person hand held assist Gait Pattern/deviations: Step-to pattern, Decreased step length - right, Decreased step length - left, Trunk flexed, Knee flexed in stance - left, Knee flexed in stance - right Gait velocity: dec  General Gait Details: Limited to very short side steps at bedside with QC and hand held assist with other UE. Pt demo inc forward lean and reports LE feel very weak as if to almost give out. Reports inc fatigue, declines further ambulation  Stairs            Wheelchair Mobility     Tilt Bed     Modified Rankin (Stroke Patients Only)       Balance Overall balance assessment: Needs assistance Sitting-balance support: Feet supported, No upper extremity supported Sitting balance-Leahy Scale: Fair Sitting balance - Comments: Seated EOB   Standing balance support: During functional activity, Reliant on assistive device for balance, Bilateral upper extremity supported Standing balance-Leahy Scale: Poor Standing balance comment: poor/fair with AD           Pertinent Vitals/Pain Pain Assessment Pain Assessment: Faces Faces Pain Scale: Hurts even more Pain Location: Abdomen Pain Descriptors / Indicators: Discomfort, Grimacing Pain Intervention(s): Limited activity within patient's tolerance, Repositioned    Home Living Family/patient expects to be discharged to:: Private residence Living Arrangements: Children (Daughter and husband) Available Help at Discharge: Family;Available PRN/intermittently Type of Home: House Home Access: Stairs to enter Entrance Stairs-Rails: Right Entrance Stairs-Number of Steps: 5-6   Home Layout: One level Home Equipment: Cane - quad;Wheelchair - manual Additional Comments: Pt poor historian due to history of dementia. Family not present to verify.    Prior Function Prior Level of Function : Independent/Modified Independent             Mobility Comments: Per SW note, pt daughter reports pt was pretty much independent. Pt confirms. Says she's needed more help recently. Reports she usually uses Quad cane in home. Does not ambulate much outside of home. ADLs Comments: Reports increased assist needed recently with bathing and dressing     Extremity/Trunk Assessment   Upper Extremity Assessment Upper Extremity Assessment: Generalized weakness (Shoulder flexion ~100 bilaterally with inc discomfort. MMT 3+/5)    Lower Extremity Assessment Lower Extremity Assessment: Generalized weakness (MMT bilaterally : Ankle DF 4/5, knee ext 3+/5,  hip flex 2+ at best. pt reporting inc pain when attempting AROM, PROM painful.)    Cervical / Trunk Assessment Cervical / Trunk Assessment: Kyphotic  Communication   Communication Communication: No apparent difficulties Factors Affecting Communication: Other (comment) (HOH)    Cognition Arousal: Alert Behavior During Therapy: WFL for tasks assessed/performed   PT - Cognitive impairments: History of cognitive impairments     Following commands: Intact       Cueing Cueing Techniques: Verbal cues, Tactile cues, Visual cues     General Comments      Exercises     Assessment/Plan    PT Assessment Patient needs continued PT services;All further PT needs can be met in the next venue of care  PT Problem List Decreased strength;Decreased range of motion;Decreased activity tolerance;Decreased balance;Decreased mobility;Pain;Decreased cognition       PT Treatment Interventions DME instruction;Gait training;Stair training;Patient/family education;Functional mobility training;Therapeutic activities;Therapeutic exercise    PT Goals (Current goals can be found in the Care Plan section)  Acute Rehab PT Goals Patient Stated Goal: Return home PT Goal Formulation: With patient Time For Goal Achievement: 09/19/23 Potential to Achieve Goals: Good    Frequency Min 3X/week     Co-evaluation               AM-PAC PT 6 Clicks Mobility  Outcome Measure Help needed turning from your back to your side while in a  flat bed without using bedrails?: A Little Help needed moving from lying on your back to sitting on the side of a flat bed without using bedrails?: A Little Help needed moving to and from a bed to a chair (including a wheelchair)?: A Lot Help needed standing up from a chair using your arms (e.g., wheelchair or bedside chair)?: A Lot Help needed to walk in hospital room?: A Lot Help needed climbing 3-5 steps with a railing? : A Lot 6 Click Score: 14    End of Session  Equipment Utilized During Treatment: Gait belt Activity Tolerance: Patient limited by fatigue;Patient limited by pain Patient left: in bed;with nursing/sitter in room;with call bell/phone within reach;with bed alarm set   PT Visit Diagnosis: Unsteadiness on feet (R26.81);Other abnormalities of gait and mobility (R26.89);Muscle weakness (generalized) (M62.81);Difficulty in walking, not elsewhere classified (R26.2);Pain Pain - part of body:  (Abdomen)    Time: 8952-8878 PT Time Calculation (min) (ACUTE ONLY): 34 min   Charges:   PT Evaluation $PT Eval Moderate Complexity: 1 Mod PT Treatments $Therapeutic Activity: 8-22 mins PT General Charges $$ ACUTE PT VISIT: 1 Visit         2:16 PM, 09/05/23 [name], PT, DPT [place] with [facility]

## 2023-09-05 NOTE — Plan of Care (Signed)
  Problem: Acute Rehab PT Goals(only PT should resolve) Goal: Pt Will Go Supine/Side To Sit Outcome: Progressing Flowsheets (Taken 09/05/2023 1418) Pt will go Supine/Side to Sit: with contact guard assist Goal: Patient Will Transfer Sit To/From Stand Outcome: Progressing Flowsheets (Taken 09/05/2023 1418) Patient will transfer sit to/from stand: with contact guard assist Goal: Pt Will Transfer Bed To Chair/Chair To Bed Outcome: Progressing Flowsheets (Taken 09/05/2023 1418) Pt will Transfer Bed to Chair/Chair to Bed: with contact guard assist Goal: Pt Will Ambulate Outcome: Progressing Flowsheets (Taken 09/05/2023 1418) Pt will Ambulate:  10 feet  with least restrictive assistive device  with minimal assist   2:18 PM, 09/05/23 Khamia Stambaugh Powell-Butler, PT, DPT [place] with [facility]

## 2023-09-05 NOTE — Progress Notes (Signed)
  Progress Note   Patient: [name] FMW:[id number] DOB: 08-04-1945 DOA: 09/01/2023     4 DOS: the patient was seen and examined on 09/05/2023   Assessment and Plan:   ABLA due to suspected GI bleed & peritoneal fat stranding along the surgical anastomosis involving the left upper quadrant - GI performed EGD 09/02/2023 --> Very large cratered gastric anastomotic ulcer with an adherent clot ( Forrest Class IIb) . Injected.  - s/p 1 unit PRBC 09/03/2023   - IV zosyn  3.375 g q8hr  - IV morphine  2 mg q4 hr PRN  - Robaxin  500 mg PO q8hr PRN  - Compazine  5 mg q6 hr PRN - IV protonix  40 mg q12  - Carafate  1g PO ACHS - Regular diet  - B12/folate/ferritin/iron are all wnl   CKD3a - Kidney function has improved   Afib - Diltiazem  24 hr 180 mg PO daily    Hypothyroidism  - Synthroid  75 mcg PO daily    Dementia  - Aricept  10 mg PO at bedtime    Depression/anxiety  - Effexor  75 mg PO bid    Subjective: Pt seen and examined at the bedside. Hgb remains stable. Case management working on disposition. Diltiazem  decreased from 240 mg daily to 180 mg daily for intermittent bradycardia.   Physical Exam: Vitals:   09/04/23 2351 09/05/23 0203 09/05/23 0559 09/05/23 0853  BP: 126/67 124/77 127/86 (!) 141/90  Pulse: 75 79 74 96  Resp:      Temp:  98.4 F (36.9 C) 98.1 F (36.7 C) 98.5 F (36.9 C)  TempSrc:  Oral Oral Oral  SpO2:  97% 98% 100%  Weight:      Height:       Physical Exam HENT:     Head: Normocephalic.  Cardiovascular:     Rate and Rhythm: Normal rate.  Pulmonary:     Effort: Pulmonary effort is normal.  Abdominal:     Palpations: Abdomen is soft.  Skin:    General: Skin is warm.  Neurological:     Mental Status: She is alert.  Psychiatric:        Mood and Affect: Mood normal.      Disposition: Status is: Inpatient Remains inpatient appropriate because: IV antibx and awaiting safe disposition   Planned Discharge Destination: Barriers to discharge: As above      Time spent: 35 minutes  Author: Nathalie Cavendish , MD 09/05/2023 11:22 AM  For on call review [email or web address].

## 2023-09-05 NOTE — [place] FL2 (Signed)
 [place]  MEDICAID [id number] LEVEL OF CARE FORM     IDENTIFICATION  Patient Name: [name] Birthdate: 12/29/1945 Sex: female Admission Date (Current Location): 09/01/2023  [facility] and [place] Number:  [employer] and Address:  [facility],  [address], [place] [address]      Provider Number: [id number]  Attending Physician Name and Address:  Luz Skelton, MD  Relative Name and Phone Number:  [name] (daughter) [phone]    Current Level of Care: Hospital Recommended Level of Care: Skilled Nursing Facility Prior Approval Number:    Date Approved/Denied:   PASRR Number: PENDING  Discharge Plan: SNF    Current Diagnoses: Patient Active Problem List   Diagnosis Date Noted   Acute marginal ulcer 09/02/2023   GI bleeding 09/01/2023   Presence of heart assist device (HCC) 08/21/2023   Cervical myelopathy (HCC) 08/21/2023   Stage 3a chronic kidney disease (HCC) 08/21/2023   Moderate cognitive impairment 08/18/2023   Presbycusis of both ears 08/18/2023   Personal history of traumatic brain injury 08/18/2023   History of bariatric surgery 08/18/2023   Major depressive disorder in partial remission (HCC) 08/18/2023   Prediabetes 05/12/2023   Cognitive changes 09/09/2022   Presence of Watchman left atrial appendage closure device 12/03/2021   Atrial fibrillation (HCC) 12/03/2021   Dysuria 05/06/2021   Unintentional weight loss 05/06/2021   Abdominal wall hernia 08/14/2019   Constipation 08/14/2019   Right sided abdominal pain    Persistent atrial fibrillation (HCC)    Sensory disturbance 06/11/2019   Intractable nausea and vomiting 06/10/2019   Atrial fibrillation with RVR (HCC) 06/09/2019   Anastomotic ulcer    Acute anemia 04/04/2019   Anemia    Excessive use of nonsteroidal anti-inflammatory drug (NSAID)    Nausea with vomiting 03/01/2019   Diarrhea 03/01/2019   Abdominal pain, epigastric 06/08/2017   LLQ pain 06/08/2017    Odynophagia 06/08/2017   Dysphagia 06/08/2017   Copper  deficiency 06/17/2015   Gastric ulceration    Hiatal hernia    Iron deficiency anemia    Spondylolisthesis of lumbar region 01/01/2013   Palpitations 05/08/2012   Insomnia due to mental disorder 12/03/2011   Benzodiazepine causing adverse effect in therapeutic use 12/03/2011   right upper urethral calculus with obstructionh 04/08/2010   Hypothyroidism 03/26/2010   Anxiety state 03/26/2010   H/O gastric bypass 03/26/2010   Hydronephrosis, right 01/27/2010   HEMOCCULT POSITIVE STOOL 08/11/2009   Generalized abdominal pain 05/20/2009   BENIGN NEOPLASM OF PARATHYROID  GLAND 03/19/2009   B12 deficiency 03/19/2009   HYPERCHOLESTEROLEMIA 03/19/2009   DEPRESSION/ANXIETY 03/19/2009   Essential hypertension 03/19/2009   Gastroesophageal reflux disease 03/19/2009   Osteoarthritis 03/19/2009    Orientation RESPIRATION BLADDER Height & Weight     Self, Place, Situation  Normal Incontinent Weight: 152 lb 5.4 oz (69.1 kg) Height:  5' 5 (165.1 cm)  BEHAVIORAL SYMPTOMS/MOOD NEUROLOGICAL BOWEL NUTRITION STATUS      Continent Diet (see dc summary)  AMBULATORY STATUS COMMUNICATION OF NEEDS Skin   Extensive Assist Verbally Other (Comment) (redness to left foot; generally pale and dry)                       Personal Care Assistance Level of Assistance  Bathing, Feeding, Dressing Bathing Assistance: Maximum assistance Feeding assistance: Independent Dressing Assistance: Maximum assistance     Functional Limitations Info  Sight, Hearing, Speech Sight Info: Adequate Hearing Info: Adequate Speech Info: Adequate    SPECIAL CARE  FACTORS FREQUENCY  PT (By licensed PT), OT (By licensed OT)     PT Frequency: 5x weekly OT Frequency: 5x weekly            Contractures Contractures Info: Not present    Additional Factors Info  Code Status, Allergies Code Status Info: Full Allergies Info: Benzodiazepines, Nsaids, Statins,  Codeine, Escitalopram Oxalate, Sertraline  Hcl, Tramadol Hcl           Current Medications (09/05/2023):  This is the current hospital active medication list Current Facility-Administered Medications  Medication Dose Route Frequency Provider Last Rate Last Admin   acetaminophen  (TYLENOL ) tablet 650 mg  650 mg Oral Q6H PRN Opyd, Timothy S, MD   650 mg at 09/05/23 9044   Or   acetaminophen  (TYLENOL ) suppository 650 mg  650 mg Rectal Q6H PRN Opyd, Timothy S, MD       [START ON 09/06/2023] diltiazem  (CARDIZEM  CD) 24 hr capsule 180 mg  180 mg Oral Daily Sira, Zackery, MD       docusate sodium  (COLACE) capsule 100 mg  100 mg Oral BID Sira, Zackery, MD   100 mg at 09/05/23 0856   donepezil  (ARICEPT ) tablet 10 mg  10 mg Oral QHS Opyd, Timothy S, MD   10 mg at 09/04/23 2143   iohexol  (OMNIPAQUE ) 350 MG/ML injection 80 mL  80 mL Intravenous Once PRN Sira, Zackery, MD       levothyroxine  (SYNTHROID ) tablet 75 mcg  75 mcg Oral Q0600 Opyd, Timothy S, MD   75 mcg at 09/05/23 0544   methocarbamol  (ROBAXIN ) tablet 500 mg  500 mg Oral Q8H PRN Sira, Zackery, MD   500 mg at 09/03/23 1737   morphine  (PF) 2 MG/ML injection 2 mg  2 mg Intravenous Q4H PRN Mansy, Jan A, MD   2 mg at 09/04/23 2351   oxybutynin  (DITROPAN -XL) 24 hr tablet 15 mg  15 mg Oral QHS Opyd, Timothy S, MD   15 mg at 09/04/23 2143   pantoprazole  (PROTONIX ) injection 40 mg  40 mg Intravenous Q12H Opyd, Timothy S, MD   40 mg at 09/05/23 0544   piperacillin -tazobactam (ZOSYN ) IVPB 3.375 g  3.375 g Intravenous Q8H Laron Agent, RPH 12.5 mL/hr at 09/05/23 0547 3.375 g at 09/05/23 0547   potassium PHOSPHATE  30 mmol in dextrose  5 % 500 mL infusion  30 mmol Intravenous Once Sira, Zackery, MD 85 mL/hr at 09/05/23 1008 30 mmol at 09/05/23 1008   prochlorperazine  (COMPAZINE ) injection 5 mg  5 mg Intravenous Q6H PRN Opyd, Timothy S, MD   5 mg at 09/03/23 1310   rosuvastatin  (CRESTOR ) tablet 40 mg  40 mg Oral Daily Opyd, Timothy S, MD   40 mg at 09/05/23 0856    sodium chloride  flush (NS) 0.9 % injection 3 mL  3 mL Intravenous Q12H Opyd, Timothy S, MD   3 mL at 09/04/23 2144   sucralfate  (CARAFATE ) 1 GM/10ML suspension 1 g  1 g Oral TID WC & HS Sira, Zackery, MD   1 g at 09/05/23 1155   venlafaxine  (EFFEXOR ) tablet 75 mg  75 mg Oral BID Opyd, Timothy S, MD   75 mg at 09/05/23 9044     Discharge Medications: Please see discharge summary for a list of discharge medications.  Relevant Imaging Results:  Relevant Lab Results:   Additional Information SSN: [id number]  Noreen KATHEE [place], [place]

## 2023-09-05 NOTE — Telephone Encounter (Signed)
 Patient currently admitted to the hospital. Found to have large cratered gastric anastomotic ulcer with an adherent clot. Recommended repeat EGD in 6-9 weeks. GI has signed off.   Ladonna: Please cancel patient's appointment with me today and reschedule her for a follow-up in 2-3 weeks with Therisa Stager, NP, other APP, or Dr. Shaaron. Please note, she is currently admitted to the hospital.

## 2023-09-05 NOTE — Plan of Care (Signed)
   Problem: Education: Goal: Knowledge of General Education information will improve Description: Including pain rating scale, medication(s)/side effects and non-pharmacologic comfort measures Outcome: Progressing   Problem: Clinical Measurements: Goal: Will remain free from infection Outcome: Progressing

## 2023-09-05 NOTE — TOC CM/SW Note (Signed)
 To whom it May Concern:  Please be advised that the above name patient will require a short-term nursing home stay- anticipated 30 days or less rehabilitation and strengthening. The plan is for return home.

## 2023-09-05 NOTE — Progress Notes (Signed)
 Nurse at bedside,patient alert to person,place,and situation,some what confused to time.Patient c/o abdominal pain rated 8,sharp and constant this morning.Patient statesMy stomach hurts when I swallow my medications. Plan of care on going.

## 2023-09-05 NOTE — Progress Notes (Signed)
 Per pt and family, when pt had Bowel movement, noticed blood in her stool. This nurse was not able to assess the BM because it was flushed. On call provider notified and aware.

## 2023-09-05 NOTE — TOC Progression Note (Addendum)
 Transition of Care [facility]) - Progression Note    Patient Details  Name: [name] MRN: [id number] Date of Birth: 11/01/1945  Transition of Care [facility]) CM/SW Contact  Noreen KATHEE [place], [place] Phone Number: 09/05/2023, 2:39 PM  Clinical Narrative:     CSW received a message from patient nurse that her daughter wanted a call . CSW did call daughter but had to leave a HIPAA VM for a return call back to discuss referral being sent. However,  daughter did mention to the case manager over the weekend  that she would like for patient referral be sent to [facility] . Also that she has already been in contact with [facility] and was told that they will hold patient bed. CSW sent referral to [facility]. TOC following.   Expected Discharge Plan: Skilled Nursing Facility Barriers to Discharge: Continued Medical Work up    Expected Discharge Plan and Services In-house Referral: Clinical Social Work Discharge Planning Services: CM Consult Post Acute Care Choice: Durable Medical Equipment Living arrangements for the past 2 months: Single Family Home                       Social Drivers of Health (SDOH) Interventions SDOH Screenings   Food Insecurity: No Food Insecurity (09/02/2023)  Housing: Low Risk  (09/02/2023)  Transportation Needs: No Transportation Needs (09/02/2023)  Utilities: Not At Risk (09/02/2023)  Alcohol Screen: Low Risk  (12/15/2022)  Depression (PHQ2-9): High Risk (05/12/2023)  Financial Resource Strain: Low Risk  (12/15/2022)  Physical Activity: Inactive (12/15/2022)  Social Connections: Moderately Isolated (09/02/2023)  Stress: No Stress Concern Present (12/15/2022)  Tobacco Use: Medium Risk (09/02/2023)  Health Literacy: Adequate Health Literacy (12/15/2022)    Readmission Risk Interventions    09/05/2023    2:36 PM 09/04/2023    4:02 PM 09/02/2023   11:33 AM  Readmission Risk Prevention Plan  Transportation Screening Complete Complete Complete  PCP or Specialist Appt within 3-5 Days  Complete    HRI or Home Care Consult Complete Complete Complete  Social Work Consult for Recovery Care Planning/Counseling Complete Complete Complete  Palliative Care Screening Not Applicable Not Applicable Not Applicable  Medication Review Oceanographer) Complete Complete Complete

## 2023-09-06 ENCOUNTER — Encounter (HOSPITAL_COMMUNITY): Payer: Self-pay | Admitting: Gastroenterology

## 2023-09-06 ENCOUNTER — Other Ambulatory Visit: Payer: Self-pay | Admitting: Gastroenterology

## 2023-09-06 DIAGNOSIS — K219 Gastro-esophageal reflux disease without esophagitis: Secondary | ICD-10-CM

## 2023-09-06 DIAGNOSIS — K922 Gastrointestinal hemorrhage, unspecified: Secondary | ICD-10-CM | POA: Diagnosis not present

## 2023-09-06 LAB — CBC
HCT: 30.7 % — ABNORMAL LOW (ref 36.0–46.0)
Hemoglobin: 9.7 g/dL — ABNORMAL LOW (ref 12.0–15.0)
MCH: 32.1 pg (ref 26.0–34.0)
MCHC: 31.6 g/dL (ref 30.0–36.0)
MCV: 101.7 fL — ABNORMAL HIGH (ref 80.0–100.0)
Platelets: 243 K/uL (ref 150–400)
RBC: 3.02 MIL/uL — ABNORMAL LOW (ref 3.87–5.11)
RDW: 14.3 % (ref 11.5–15.5)
WBC: 9.4 K/uL (ref 4.0–10.5)
nRBC: 0 % (ref 0.0–0.2)

## 2023-09-06 LAB — COMPREHENSIVE METABOLIC PANEL WITH GFR
ALT: 43 U/L (ref 0–44)
AST: 107 U/L — ABNORMAL HIGH (ref 15–41)
Albumin: 2.5 g/dL — ABNORMAL LOW (ref 3.5–5.0)
Alkaline Phosphatase: 150 U/L — ABNORMAL HIGH (ref 38–126)
Anion gap: 6 (ref 5–15)
BUN: 12 mg/dL (ref 8–23)
CO2: 22 mmol/L (ref 22–32)
Calcium: 8.2 mg/dL — ABNORMAL LOW (ref 8.9–10.3)
Chloride: 112 mmol/L — ABNORMAL HIGH (ref 98–111)
Creatinine, Ser: 1.35 mg/dL — ABNORMAL HIGH (ref 0.44–1.00)
GFR, Estimated: 40 mL/min — ABNORMAL LOW (ref >60)
Glucose, Bld: 91 mg/dL (ref 70–99)
Potassium: 3.8 mmol/L (ref 3.5–5.1)
Sodium: 140 mmol/L (ref 135–145)
Total Bilirubin: 0.5 mg/dL (ref 0.0–1.2)
Total Protein: 5.5 g/dL — ABNORMAL LOW (ref 6.5–8.1)

## 2023-09-06 LAB — C-REACTIVE PROTEIN: CRP: 0.7 mg/dL (ref <1.0)

## 2023-09-06 LAB — PHOSPHORUS: Phosphorus: 2.4 mg/dL — ABNORMAL LOW (ref 2.5–4.6)

## 2023-09-06 LAB — MAGNESIUM: Magnesium: 2.1 mg/dL (ref 1.7–2.4)

## 2023-09-06 MED ORDER — AMOXICILLIN-POT CLAVULANATE 875-125 MG PO TABS: 1.0000 | ORAL_TABLET | Freq: Two times a day (BID) | ORAL | 0 refills | Status: AC

## 2023-09-06 MED ORDER — POTASSIUM PHOSPHATES 15 MMOLE/5ML IV SOLN
15.0000 mmol | Freq: Once | INTRAVENOUS | Status: AC
  Administered 2023-09-06: 15 mmol via INTRAVENOUS
  Filled 2023-09-06: qty 5

## 2023-09-06 MED ORDER — SUCRALFATE 1 GM/10ML PO SUSP: 1.0000 g | Freq: Three times a day (TID) | ORAL | 0 refills | Status: DC

## 2023-09-06 MED ORDER — PANTOPRAZOLE SODIUM 40 MG PO TBEC: 40.0000 mg | DELAYED_RELEASE_TABLET | Freq: Two times a day (BID) | ORAL | 0 refills | Status: DC

## 2023-09-06 MED ORDER — DILTIAZEM HCL ER COATED BEADS 180 MG PO CP24: 180.0000 mg | ORAL_CAPSULE | Freq: Every day | ORAL | 0 refills | Status: DC

## 2023-09-06 MED ORDER — SUCRALFATE 1 G PO TABS: 1.0000 g | ORAL_TABLET | Freq: Four times a day (QID) | ORAL | 0 refills | Status: AC

## 2023-09-06 NOTE — Progress Notes (Signed)
 Physical Therapy Treatment Patient Details Name: [name] MRN: [id number] DOB: Mar 18, 1945 Today's Date: 09/06/2023   History of Present Illness Patient presents with: Fatigue and Abdominal Pain        [name] is a 78 y.o. female.        Patient has had abdominal pain since Sunday and had some black stool Sunday.  She is complaining of abdominal pain now.  Patient had gastric bypass surgery years ago and history of ulcers. [name]  has presented today for surgery, with the diagnosis of Acute post hemorrhagic anemia, melena, abnormal CT.  The various methods of treatment have been discussed with the patient and family. After consideration of risks, benefits and other options for treatment, the patient has consented to  Procedure(s):  EGD (ESOPHAGOGASTRODUODENOSCOPY) (N/A) as a surgical intervention.    PT Comments  Pt supine in bed and reports needs for bowel movement.  Pt mod I with bed mobility, increased time and need for railings to assist with supine to sit.  Pt able to donn sock independently.  Min-mod A with transfer to standing, pt unstable upon standing.  Used QC and 1 HHA for safety to ambulate to restroom with no LOB.  Pt will benefits from use of RW in future for safety.  Pt able to stand safely to wash hands and ambulate to chair.  SEated exercises complete for LE strengthening.  Encouraged pt to stay in chair as long as able.  Call bell within reach, chair alarm set and NT in room.  Pt did reports some abdominal pain that increases with movements, RN aware.    If plan is discharge home, recommend the following:     Can travel by private vehicle        Equipment Recommendations       Recommendations for Other Services       Precautions / Restrictions Precautions Precautions: Fall Recall of Precautions/Restrictions: Impaired Restrictions Weight Bearing Restrictions Per Provider Order: No     Mobility  Bed Mobility         Supine to sit: Modified independent  (Device/Increase time)     General bed mobility comments: increased time, use of bed rails to assist, labored movements.  Able to donn sock Independently    Transfers Overall transfer level: Needs assistance Equipment used: Quad cane, 1 person hand held assist Transfers: Sit to/from Stand Sit to Stand: Min assist, Mod assist           General transfer comment: Slow labored movements.  Used quad cane with 1 HHA to restroom (pt switches UE use).    Ambulation/Gait Ambulation/Gait assistance: Min assist, Mod assist Gait Distance (Feet): 20 Feet Assistive device: Quad cane, 1 person hand held assist         General Gait Details: Slow labored movements.  Pt wiht need for restroom break, used QC and 1 HHA.  No LOB.  Incrased forward lean and limited by LE weakness.   Stairs             Wheelchair Mobility     Tilt Bed    Modified Rankin (Stroke Patients Only)       Balance                                            Communication Communication Communication: No apparent difficulties  Cognition Arousal:  Alert Behavior During Therapy: WFL for tasks assessed/performed   PT - Cognitive impairments: History of cognitive impairments                         Following commands: Intact      Cueing Cueing Techniques: Verbal cues, Tactile cues, Visual cues  Exercises General Exercises - Lower Extremity Ankle Circles/Pumps: AROM, 10 reps, Seated Long Arc Quad: AROM, 10 reps, Seated Hip ABduction/ADduction: AROM, 10 reps, Seated Hip Flexion/Marching: AROM, 10 reps, Seated    General Comments        Pertinent Vitals/Pain Pain Assessment Pain Score: 9  Pain Location: Abdomen Pain Intervention(s): Limited activity within patient's tolerance, Monitored during session, Repositioned    Home Living                          Prior Function            PT Goals (current goals can now be found in the care plan section)       Frequency           PT Plan      Co-evaluation              AM-PAC PT 6 Clicks Mobility   Outcome Measure  Help needed turning from your back to your side while in a flat bed without using bedrails?: A Little Help needed moving from lying on your back to sitting on the side of a flat bed without using bedrails?: A Little Help needed moving to and from a bed to a chair (including a wheelchair)?: A Lot Help needed standing up from a chair using your arms (e.g., wheelchair or bedside chair)?: A Lot Help needed to walk in hospital room?: A Lot Help needed climbing 3-5 steps with a railing? : A Lot 6 Click Score: 14    End of Session Equipment Utilized During Treatment: Gait belt Activity Tolerance: Patient limited by fatigue;Patient limited by pain Patient left: in chair;with call bell/phone within reach;with chair alarm set;with nursing/sitter in room   PT Visit Diagnosis: Unsteadiness on feet (R26.81);Other abnormalities of gait and mobility (R26.89);Muscle weakness (generalized) (M62.81);Difficulty in walking, not elsewhere classified (R26.2);Pain     Time: 1020-1046 PT Time Calculation (min) (ACUTE ONLY): 26 min  Charges:    $Therapeutic Exercise: 8-22 mins $Therapeutic Activity: 8-22 mins PT General Charges $$ ACUTE PT VISIT: 1 Visit                     [name], LPTA/CLT; CBIS [phone]  [name] Kendra Amble 09/06/2023, 10:57 AM

## 2023-09-06 NOTE — Discharge Summary (Signed)
 Physician Discharge Summary   Patient: [name] MRN: [id number] DOB: [birth year]  Admit date:     09/01/2023  Discharge date: 09/06/23  Discharge Physician: ATLEE ABERNETHY    PCP: Kendra Wilhelmena Lloyd Hilario, FNP      Discharge Diagnoses: Principal Problem:   GI bleeding Active Problems:   DEPRESSION/ANXIETY   Hypothyroidism   Persistent atrial fibrillation (HCC)   Cognitive changes   Stage 3a chronic kidney disease (HCC)   Acute marginal ulcer  Resolved Problems:   * No resolved hospital problems. *  Hospital Course:  78 yo F admitted for ABLA due to suspected GI bleed & peritoneal fat stranding along the surgical anastomosis involving the left upper quadrant. B12/folate/ferritin/iron were all checked and found to be wnl.  GI was consulted. GI performed EGD 09/02/2023 --> Very large cratered gastric anastomotic ulcer with an adherent clot ( Forrest Class IIb)  which was injected. s/p 1 unit PRBC 09/03/2023 and several days of IV zosyn . After the EGD the GI service requested that the pt continue on IV protonix  40 mg q12 and Carafate  1g PO ACHS. Atenolol  was discontinued and diltiazem  was decreased from 240 mg PO daily to 180 mg PO daily (due to bradycardia). On 09/06/2023 the case management dept advised that the pt was cleared to discharge to the facility to continue her rehabilitation.  DISCHARGE MEDICATION: Allergies as of 09/06/2023       Reactions   Benzodiazepines Other (See Comments)   Cloudy thinking, memory loss, withdrawal symptoms when trying to stop without any other medication for detox.    Nsaids Other (See Comments)   Bleeding ulcer    Statins Nausea And Vomiting   Codeine Nausea Only   Escitalopram Oxalate Nausea Only   Sertraline  Hcl Nausea Only   Tramadol Hcl Other (See Comments)   Daughter doesn't want mother to take due to the fact that the medication made the daughter have a seizure         Medication List     STOP taking these medications    atenolol   50 MG tablet Commonly known as: TENORMIN        TAKE these medications    acetaminophen  500 MG tablet Commonly known as: TYLENOL  Take 1,000 mg by mouth every 6 (six) hours as needed for mild pain (pain score 1-3).   alendronate  70 MG tablet Commonly known as: FOSAMAX  Take 1 tablet (70 mg total) by mouth every 7 (seven) days. Take with a full glass of water  on an empty stomach. What changed: when to take this   amoxicillin -clavulanate 875-125 MG tablet Commonly known as: AUGMENTIN  Take 1 tablet by mouth 2 (two) times daily for 3 days.   cyanocobalamin 1000 MCG/ML injection Commonly known as: VITAMIN B12 Inject 1,000 mcg into the muscle every 30 (thirty) days.   diltiazem  180 MG 24 hr capsule Commonly known as: Cardizem  CD Take 1 capsule (180 mg total) by mouth daily. What changed:  medication strength how much to take   donepezil  10 MG tablet Commonly known as: ARICEPT  Take 1 tablet (10 mg total) by mouth at bedtime.   levothyroxine  75 MCG tablet Commonly known as: SYNTHROID  Take 75 mcg by mouth daily before breakfast.   lidocaine  5 % Commonly known as: LIDODERM  PLACE 1 PATCH ONTO THE SKIN DAILY. REMOVE & DISCARD PATCH WITHIN 12 HOURS OR AS DIRECTED BY MD   ondansetron  4 MG tablet Commonly known as: ZOFRAN  Take 1 tablet (4 mg total) by mouth every 8 (eight)  hours as needed for nausea or vomiting.   oxybutynin  15 MG 24 hr tablet Commonly known as: DITROPAN  XL Take 1 tablet (15 mg total) by mouth at bedtime. What changed: when to take this   pantoprazole  40 MG tablet Commonly known as: PROTONIX  Take 1 tablet (40 mg total) by mouth 2 (two) times daily.   rosuvastatin  40 MG tablet Commonly known as: CRESTOR  Take 1 tablet (40 mg total) by mouth daily.   sucralfate  1 GM/10ML suspension Commonly known as: CARAFATE  Take 10 mLs (1 g total) by mouth 4 (four) times daily -  with meals and at bedtime.   venlafaxine  75 MG tablet Commonly known as: EFFEXOR  Take 75  mg by mouth 2 (two) times daily.   Vitamin D  (Ergocalciferol ) 1.25 MG (50000 UNIT) Caps capsule Commonly known as: DRISDOL  Take 1 capsule (50,000 Units total) by mouth every Monday.        Contact information for after-discharge care     Destination     [facility] .   Service: Skilled Nursing Contact information: [address] [place]  [address] [phone]                    Discharge Exam: [name]   09/01/23 1330 09/02/23 0802  Weight: 68 kg 69.1 kg   Physical Exam HENT:     Head: Normocephalic.  Cardiovascular:     Rate and Rhythm: Normal rate.  Pulmonary:     Effort: Pulmonary effort is normal.  Abdominal:     General: Abdomen is flat.     Palpations: Abdomen is soft.  Skin:    General: Skin is warm.  Neurological:     Mental Status: She is alert and oriented to person, place, and time.  Psychiatric:        Mood and Affect: Mood normal.      Condition at discharge: fair  The results of significant diagnostics from this hospitalization (including imaging, microbiology, ancillary and laboratory) are listed below for reference.   Imaging Studies: CT ANGIO GI BLEED Result Date: 09/01/2023 CLINICAL DATA:  GI bleed EXAM: CTA ABDOMEN AND PELVIS WITHOUT AND WITH CONTRAST TECHNIQUE: Multidetector CT imaging of the abdomen and pelvis was performed using the standard protocol during bolus administration of intravenous contrast. Multiplanar reconstructed images and MIPs were obtained and reviewed to evaluate the vascular anatomy. RADIATION DOSE REDUCTION: This exam was performed according to the departmental dose-optimization program which includes automated exposure control, adjustment of the mA and/or kV according to patient size and/or use of iterative reconstruction technique. CONTRAST:  80mL OMNIPAQUE  IOHEXOL  350 MG/ML SOLN COMPARISON:  CT abdomen pelvis October 16, 2022, and July 12, 2021. FINDINGS: VASCULAR Aorta: Normal caliber  aorta without aneurysm, dissection, vasculitis or significant stenosis. Celiac: Patent without evidence of aneurysm, dissection, vasculitis or significant stenosis. SMA: Patent without evidence of aneurysm, dissection, vasculitis or significant stenosis. Renals: Both renal arteries are patent without evidence of aneurysm, dissection, vasculitis, fibromuscular dysplasia or significant stenosis. IMA: Patent without evidence of aneurysm, dissection, vasculitis or significant stenosis. Inflow: Patent without evidence of aneurysm, dissection, vasculitis or significant stenosis. Proximal Outflow: Bilateral common femoral and visualized portions of the superficial and profunda femoral arteries are patent without evidence of aneurysm, dissection, vasculitis or significant stenosis. Veins: No obvious venous abnormality within the limitations of this arterial phase study. Review of the MIP images confirms the above findings. NON-VASCULAR Lower chest: Cardiomegaly. Aortic annulus and coronary artery calcification. Partially visualized mitral leaflet calcifications. Hepatobiliary:  Cholecystectomy. No biliary dilatation. Punctate right hepatic lobe calcification. Liver appears normal without focal enhancing lesion. Pancreas: Atrophic changes of the pancreas. Spleen: Normal in size without focal abnormality. Punctate calcifications suggestive of prior granulomatous disease. Adrenals/Urinary Tract: Adrenal glands are unremarkable. Kidneys are normal, without renal calculi, focal lesion, or hydronephrosis. Bladder is unremarkable. Stomach/Bowel: Status post Roux-en-Y gastric bypass. There is omental/peritoneal fat stranding along the surgical anastomosis involving the left upper quadrant (9/79-81). Multiple prominent and enhancing mesenteric lymph nodes are identified measuring up to 1.2 cm. (9/90, 88). These findings are similar to prior exam from July 12, 2021 and new to prior exam from 2024. No bowel obstruction or active site  of bleeding. Large stool identified throughout the colon. Submucosal fat along the terminal ileum suggestive of chronic colitis. Appendectomy.  No fluid collection or free gas. Lymphatic: No significant vascular findings are present. No enlarged abdominal or pelvic lymph nodes. Reproductive: Hysterectomy.  No adnexal mass. Other: No abdominal wall hernia or abnormality. No abdominopelvic ascites. Musculoskeletal: Degenerative changes of the spine. Posterior spinal fusion device L4-L5 and inter spinous cage. IMPRESSION: VASCULAR Atherosclerotic calcifications, otherwise unremarkable. No definite finding to suggest active bleeding. NON-VASCULAR 1. Diffuse fat stranding in left upper quadrant around the site of gastrojejunostomy bypass anastomosis, and multiple mesenteric root enhancing lymph nodes/misty mesentery. No definite site of active bleeding, perianastomotic fluid collection or free air . Findings are likely suggestive of active infectious/inflammatory process and similar to prior exam from July 12, 2021. Recommend correlation with endoscopy and clinical findings. 2. Submucosal fat along the terminal ileum suggestive of chronic inflammatory changes. Electronically Signed   By: Megan  Zare M.D.   On: 09/01/2023 20:17    Microbiology: Results for orders placed or performed during the hospital encounter of 09/01/23  MRSA Next Gen by PCR, Nasal     Status: None   Collection Time: 09/02/23  8:20 AM   Specimen: Nasal Mucosa; Nasal Swab  Result Value Ref Range Status   MRSA by PCR Next Gen NOT DETECTED NOT DETECTED Final    Comment: (NOTE) The GeneXpert MRSA Assay (FDA approved for NASAL specimens only), is one component of a comprehensive MRSA colonization surveillance program. It is not intended to diagnose MRSA infection nor to guide or monitor treatment for MRSA infections. Test performance is not FDA approved in patients less than 5 years old. Performed at [facility], [address]  [place], [place] [address]     Labs: CBC: Recent Labs  Lab 09/01/23 1525 09/01/23 2251 09/03/23 0457 09/03/23 1452 09/04/23 0626 09/05/23 0433 09/06/23 0411  WBC 10.1   < > 6.1 6.4 7.1 8.9 9.4  NEUTROABS 8.5*  --   --   --   --   --   --   HGB 8.5*   < > 7.1* 8.9* 9.4* 8.8* 9.7*  HCT 26.1*   < > 22.0* 27.6* 29.2* 27.9* 30.7*  MCV 101.2*   < > 101.9* 101.1* 99.7 102.2* 101.7*  PLT 141*   < > 144* 161 175 207 243   < > = values in this interval not displayed.   Basic Metabolic Panel: Recent Labs  Lab 09/02/23 0305 09/03/23 0457 09/04/23 0626 09/05/23 0433 09/06/23 0411  NA 137 137 141 139 140  K 4.2 4.3 4.0 3.6 3.8  CL 106 108 112* 112* 112*  CO2 25 25 25  21* 22  GLUCOSE 93 93 90 83 91  BUN 39* 30* 18 17 12   CREATININE 1.22* 1.35* 1.25* 1.30*  1.35*  CALCIUM  8.2* 7.8* 7.7* 7.5* 8.2*  MG  --  2.0 2.0 2.1 2.1  PHOS  --  1.8* 2.5 1.7* 2.4*   Liver Function Tests: Recent Labs  Lab 09/01/23 1525 09/04/23 0626 09/05/23 0433 09/06/23 0411  AST 18 48* 51* 107*  ALT 17 29 27  43  ALKPHOS 44 98 96 150*  BILITOT 0.6 0.3 <0.2 0.5  PROT 5.8* 5.3* 5.0* 5.5*  ALBUMIN 2.9* 2.5* 2.3* 2.5*   CBG: No results for input(s): GLUCAP in the last 168 hours.  Discharge time spent: greater than 30 minutes.  Signed: Ariyan Brisendine , MD [facility] 09/06/2023

## 2023-09-06 NOTE — TOC Transition Note (Signed)
 Transition of Care [facility]) - Discharge Note   Patient Details  Name: [name] MRN: [id number] Date of Birth: 09-Dec-1945  [facility] CM/SW Contact:  Noreen KATHEE Cleotilde [place] Phone Number: 09/06/2023, 12:51 PM   Clinical Narrative:     Patient is [facility] today to [facility] with [facility] updated. Daughter was contacted as well. Nurse provided with room number and number to call report. Daughter requested for MD to call her , MD made aware. Med necessity complete and EMS called. TOC signing off.   Final next level of care: Skilled Nursing Facility Barriers to Discharge: Barriers Resolved   Patient Goals and CMS Choice Patient states their goals for this hospitalization and ongoing recovery are:: return to SNF CMS Medicare.gov Compare Post Acute Care list provided to:: Patient Represenative (must comment) (Daughter - [name]) Choice offered to / list presented to : Adult Children      Discharge Placement              Patient chooses bed at: [facility] Patient to be transferred to facility by: EMS Name of family member notified: [name]- daughter Patient and family notified of of transfer: 09/06/23  Discharge Plan and Services Additional resources added to the After Visit Summary for   In-house Referral: Clinical Social Work Discharge Planning Services: CM Consult Post Acute Care Choice: Durable Medical Equipment                               Social Drivers of Health (SDOH) Interventions SDOH Screenings   Food Insecurity: No Food Insecurity (09/02/2023)  Housing: Low Risk  (09/02/2023)  Transportation Needs: No Transportation Needs (09/02/2023)  Utilities: Not At Risk (09/02/2023)  Alcohol Screen: Low Risk  (12/15/2022)  Depression (PHQ2-9): High Risk (05/12/2023)  Financial Resource Strain: Low Risk  (12/15/2022)  Physical Activity: Inactive (12/15/2022)  Social Connections: Moderately Isolated (09/02/2023)  Stress: No Stress Concern Present (12/15/2022)   Tobacco Use: Medium Risk (09/02/2023)  Health Literacy: Adequate Health Literacy (12/15/2022)     Readmission Risk Interventions    09/06/2023   12:41 PM 09/05/2023    2:36 PM 09/04/2023    4:02 PM  Readmission Risk Prevention Plan  Transportation Screening Complete Complete Complete  PCP or Specialist Appt within 3-5 Days   Complete  HRI or Home Care Consult Complete Complete Complete  Social Work Consult for Recovery Care Planning/Counseling Complete Complete Complete  Palliative Care Screening Not Applicable Not Applicable Not Applicable  Medication Review Oceanographer) Complete Complete Complete

## 2023-09-07 DIAGNOSIS — K922 Gastrointestinal hemorrhage, unspecified: Secondary | ICD-10-CM | POA: Diagnosis not present

## 2023-09-07 DIAGNOSIS — Z980 Intestinal bypass and anastomosis status: Secondary | ICD-10-CM | POA: Diagnosis not present

## 2023-09-07 DIAGNOSIS — E785 Hyperlipidemia, unspecified: Secondary | ICD-10-CM | POA: Diagnosis not present

## 2023-09-07 DIAGNOSIS — R52 Pain, unspecified: Secondary | ICD-10-CM | POA: Diagnosis not present

## 2023-09-07 DIAGNOSIS — D62 Acute posthemorrhagic anemia: Secondary | ICD-10-CM | POA: Diagnosis not present

## 2023-09-07 DIAGNOSIS — K250 Acute gastric ulcer with hemorrhage: Secondary | ICD-10-CM | POA: Diagnosis not present

## 2023-09-07 DIAGNOSIS — M810 Age-related osteoporosis without current pathological fracture: Secondary | ICD-10-CM | POA: Diagnosis not present

## 2023-09-07 DIAGNOSIS — F419 Anxiety disorder, unspecified: Secondary | ICD-10-CM | POA: Diagnosis not present

## 2023-09-07 DIAGNOSIS — I129 Hypertensive chronic kidney disease with stage 1 through stage 4 chronic kidney disease, or unspecified chronic kidney disease: Secondary | ICD-10-CM | POA: Diagnosis not present

## 2023-09-07 DIAGNOSIS — N3281 Overactive bladder: Secondary | ICD-10-CM | POA: Diagnosis not present

## 2023-09-08 DIAGNOSIS — K284 Chronic or unspecified gastrojejunal ulcer with hemorrhage: Secondary | ICD-10-CM | POA: Diagnosis not present

## 2023-09-08 DIAGNOSIS — Z7189 Other specified counseling: Secondary | ICD-10-CM | POA: Diagnosis not present

## 2023-09-08 DIAGNOSIS — Z79899 Other long term (current) drug therapy: Secondary | ICD-10-CM | POA: Diagnosis not present

## 2023-09-08 DIAGNOSIS — D62 Acute posthemorrhagic anemia: Secondary | ICD-10-CM | POA: Diagnosis not present

## 2023-09-08 DIAGNOSIS — Z980 Intestinal bypass and anastomosis status: Secondary | ICD-10-CM | POA: Diagnosis not present

## 2023-09-08 DIAGNOSIS — R001 Bradycardia, unspecified: Secondary | ICD-10-CM | POA: Diagnosis not present

## 2023-09-12 DIAGNOSIS — Z79899 Other long term (current) drug therapy: Secondary | ICD-10-CM | POA: Diagnosis not present

## 2023-09-12 DIAGNOSIS — N3281 Overactive bladder: Secondary | ICD-10-CM | POA: Diagnosis not present

## 2023-09-12 DIAGNOSIS — M810 Age-related osteoporosis without current pathological fracture: Secondary | ICD-10-CM | POA: Diagnosis not present

## 2023-09-12 DIAGNOSIS — F32A Depression, unspecified: Secondary | ICD-10-CM | POA: Diagnosis not present

## 2023-09-13 DIAGNOSIS — I4891 Unspecified atrial fibrillation: Secondary | ICD-10-CM | POA: Diagnosis not present

## 2023-09-13 DIAGNOSIS — F419 Anxiety disorder, unspecified: Secondary | ICD-10-CM | POA: Diagnosis not present

## 2023-09-13 DIAGNOSIS — K284 Chronic or unspecified gastrojejunal ulcer with hemorrhage: Secondary | ICD-10-CM | POA: Diagnosis not present

## 2023-09-13 DIAGNOSIS — R52 Pain, unspecified: Secondary | ICD-10-CM | POA: Diagnosis not present

## 2023-09-13 DIAGNOSIS — Z79899 Other long term (current) drug therapy: Secondary | ICD-10-CM | POA: Diagnosis not present

## 2023-09-14 ENCOUNTER — Telehealth: Payer: Self-pay | Admitting: Cardiology

## 2023-09-14 DIAGNOSIS — I4891 Unspecified atrial fibrillation: Secondary | ICD-10-CM | POA: Diagnosis not present

## 2023-09-14 DIAGNOSIS — E039 Hypothyroidism, unspecified: Secondary | ICD-10-CM | POA: Diagnosis not present

## 2023-09-14 DIAGNOSIS — R4189 Other symptoms and signs involving cognitive functions and awareness: Secondary | ICD-10-CM | POA: Diagnosis not present

## 2023-09-14 DIAGNOSIS — E785 Hyperlipidemia, unspecified: Secondary | ICD-10-CM | POA: Diagnosis not present

## 2023-09-14 DIAGNOSIS — N1831 Chronic kidney disease, stage 3a: Secondary | ICD-10-CM | POA: Diagnosis not present

## 2023-09-14 DIAGNOSIS — F419 Anxiety disorder, unspecified: Secondary | ICD-10-CM | POA: Diagnosis not present

## 2023-09-14 DIAGNOSIS — Z980 Intestinal bypass and anastomosis status: Secondary | ICD-10-CM | POA: Diagnosis not present

## 2023-09-14 DIAGNOSIS — F32A Depression, unspecified: Secondary | ICD-10-CM | POA: Diagnosis not present

## 2023-09-14 DIAGNOSIS — D62 Acute posthemorrhagic anemia: Secondary | ICD-10-CM | POA: Diagnosis not present

## 2023-09-14 DIAGNOSIS — N3281 Overactive bladder: Secondary | ICD-10-CM | POA: Diagnosis not present

## 2023-09-14 DIAGNOSIS — K284 Chronic or unspecified gastrojejunal ulcer with hemorrhage: Secondary | ICD-10-CM | POA: Diagnosis not present

## 2023-09-14 NOTE — Telephone Encounter (Signed)
 I relayed reason why dose was changed to daughter and she is agreeable.

## 2023-09-14 NOTE — Telephone Encounter (Signed)
 09/05/23 note from Dr.Zackery:  Subjective: Pt seen and examined at the bedside. Hgb remains stable. Case management working on disposition. Diltiazem  decreased from 240 mg daily to 180 mg daily for intermittent bradycardia.

## 2023-09-14 NOTE — Telephone Encounter (Signed)
 Pt c/o medication issue:  1. Name of Medication: diltiazem  (CARDIZEM  CD) 180 MG 24 hr capsule   2. How are you currently taking this medication (dosage and times per day)? Has not taken yet  3. Are you having a reaction (difficulty breathing--STAT)? no  4. What is your medication issue? Patient was taking 240mg  of Diltiazem  but it was switched in the hospital to 180mg . [name] wants to know if Dr. Debera is okay with the 180mg  or if she should go back to the 240mg . Please advise.

## 2023-09-15 DIAGNOSIS — E039 Hypothyroidism, unspecified: Secondary | ICD-10-CM | POA: Diagnosis not present

## 2023-09-15 DIAGNOSIS — Z79899 Other long term (current) drug therapy: Secondary | ICD-10-CM | POA: Diagnosis not present

## 2023-09-15 DIAGNOSIS — E785 Hyperlipidemia, unspecified: Secondary | ICD-10-CM | POA: Diagnosis not present

## 2023-09-15 DIAGNOSIS — N1831 Chronic kidney disease, stage 3a: Secondary | ICD-10-CM | POA: Diagnosis not present

## 2023-09-20 ENCOUNTER — Ambulatory Visit (INDEPENDENT_AMBULATORY_CARE_PROVIDER_SITE_OTHER): Admitting: Gastroenterology

## 2023-09-20 VITALS — BP 106/74 | HR 73 | Temp 98.2°F | Ht 65.0 in | Wt 150.0 lb

## 2023-09-20 DIAGNOSIS — D62 Acute posthemorrhagic anemia: Secondary | ICD-10-CM

## 2023-09-20 DIAGNOSIS — R109 Unspecified abdominal pain: Secondary | ICD-10-CM | POA: Diagnosis not present

## 2023-09-20 DIAGNOSIS — Z9884 Bariatric surgery status: Secondary | ICD-10-CM | POA: Diagnosis not present

## 2023-09-20 DIAGNOSIS — K279 Peptic ulcer, site unspecified, unspecified as acute or chronic, without hemorrhage or perforation: Secondary | ICD-10-CM

## 2023-09-20 DIAGNOSIS — K5900 Constipation, unspecified: Secondary | ICD-10-CM | POA: Diagnosis not present

## 2023-09-20 DIAGNOSIS — D509 Iron deficiency anemia, unspecified: Secondary | ICD-10-CM

## 2023-09-20 NOTE — Progress Notes (Addendum)
 Gastroenterology Office Note     Primary Care Physician:  Terry Wilhelmena Lloyd Hilario, FNP  Primary Gastroenterologist: Dr. Shaaron    Chief Complaint   Chief Complaint  Patient presents with   Follow-up    Follow up hospital (for a week). Pt is better than the day she went to the hospital     History of Present Illness   [name] is a 78 y.o. female presenting today with a history of gastric bypass, known anastomotic ulcer in 2016 and 2021, A-fib not on anticoagulation, hospitalized in August 2025 with abdominal pain, melena, and anemia s/p  EGD with large anastomotic ulcer and clot, presenting for hospital follow-up. Daughter, [name], is present with her today.   Resides at [facility] currently. Previously lived with daughter. States stool is dark, which is chronic. Daughter believes she may have had recent blood work at [employer].  No oral iron on med list. PPI BID, carafate  QID. No NSAIDs currently. No NSAIDs prior.   Presenting Hgb at time of hospitalization was 8.5, previously 13 in April this year. As Hgb declined, she had 1 unit PRBCs during admission. At discharge, Hgb was 9.7.   She has chronic abdominal pain, known right lateral abadominal wall hernia and right sided spigelian hernia. Notes postrpandial abdominal pain. CTA during admission with celiac, SMA, IMA patent. Chronic abdominal pain dating back to time of gastric bypass in the past 2007. CTA also showed diffuse fat stranding in LUQ at site of GJ anastomosis and multiple enhancing lymph nodes.   Chronic constipation. Takes Miralax  daily.   EGD during [facility] admission Aug 2025: gastric bypass with very large cratered gastric anastomotic ulcer and adherent clot, s/p injection. Needs surveillance in 6-8 weeks   EGD 12/01/2022: Normal esophagus s/p empiric dilation, small hiatal hernia, abnormal residual gastric mucosa s/p biopsy.  Pathology showed gastric oxyntic mucosa with changes consistent with PPI therapy,  negative for H. pylori.    Colonoscopy April 2022: seven 4-7 mm polyps, diverticulosis, tubular adenomas, surveillance in 3 years.     EGD 06/13/2019:Prior gastric bypass surgery with Billroth II type anatomy, otherwise normal-appearing residual upper GI tract    March 2021 with abdominal pain status post EGD with 2 cm ulcer crater at the anastomosis with smaller satellite areas of ulceration in the setting of NSAID/Goody powders.     Past Medical History:  Diagnosis Date   Anemia    Anxiety    Arthritis    Chronic back pain    Spondylosis and stenosis   Depression    GERD (gastroesophageal reflux disease)    Headache(784.0)    Hiatal hernia    History of blood transfusion    History of GI bleed    Hypothyroidism    Mitral regurgitation    Obsessive-compulsive disorder    Persistent atrial fibrillation (HCC)    Presence of Watchman left atrial appendage closure device 12/03/2021   31mm Watchman FLX with Dr. Wonda   Recurrent upper respiratory infection (URI)    Sleep apnea     Past Surgical History:  Procedure Laterality Date   ABDOMINAL HYSTERECTOMY     ANTERIOR CERVICAL DECOMP/DISCECTOMY FUSION  12/09/2010   Procedure: ANTERIOR CERVICAL DECOMPRESSION/DISCECTOMY FUSION 3 LEVELS;  Surgeon: Reyes JONETTA Budge;  Location: [facility];  Service: Neurosurgery;  Laterality: N/A;  Cervical three-four,Cervical four-five,Cervical Five-Six,Cervical Six-Seven ANTERIOR CERVICAL DECOMPRESSION WITH FUSION INTERBODY PROTHESIS PLATING AND BONEGRAFT   APPENDECTOMY     BIOPSY N/A 11/14/2014   Procedure:  GASTRIC BIOPSY;  Surgeon: Lamar CHRISTELLA Hollingshead, MD;  Location: [facility];  Service: Endoscopy;  Laterality: N/A;   CESAREAN SECTION     CHOLECYSTECTOMY     COLONOSCOPY WITH PROPOFOL  N/A 11/14/2014   RMR: redundant colon but otherwise normal   COLONOSCOPY WITH PROPOFOL  N/A 05/12/2020   Dr. Hollingshead;  Seven 4 to 7 mm polyps, diverticulosis in the sigmoid colon. Pathology with tubular adenomas.  Recommended repeat in 3 years.   ESOPHAGOGASTRODUODENOSCOPY N/A 09/02/2023   Procedure: EGD (ESOPHAGOGASTRODUODENOSCOPY);  Surgeon: Cinderella Deatrice FALCON, MD;  Location: [facility];  Service: Endoscopy;  Laterality: N/A;   ESOPHAGOGASTRODUODENOSCOPY (EGD) WITH PROPOFOL  N/A 11/14/2014   RMRs/p gastric surgery/anastomtic ulcer   ESOPHAGOGASTRODUODENOSCOPY (EGD) WITH PROPOFOL  N/A 09/15/2017   Procedure: ESOPHAGOGASTRODUODENOSCOPY (EGD) WITH PROPOFOL ;  Surgeon: Hollingshead Lamar CHRISTELLA, MD;  Location: [facility];  Service: Endoscopy;  Laterality: N/A;  11:30am   ESOPHAGOGASTRODUODENOSCOPY (EGD) WITH PROPOFOL  N/A 04/04/2019   2 cm ulcer crater at anastomosis with smaller satellite areas of ulceration in setting of NSAIDs/Goody powders, no bleeding stigmata.   ESOPHAGOGASTRODUODENOSCOPY (EGD) WITH PROPOFOL  N/A 06/13/2019   Rourk: Prior gastric bypass surgery with Billroth II type anatomy, otherwise normal-appearing residual upper GI tract.   ESOPHAGOGASTRODUODENOSCOPY (EGD) WITH PROPOFOL  N/A 12/01/2022   Procedure: ESOPHAGOGASTRODUODENOSCOPY (EGD) WITH PROPOFOL ;  Surgeon: Hollingshead Lamar CHRISTELLA, MD;  [facility];  Service: Endoscopy;  Laterality: N/A;  830am, asa 3   EYE SURGERY     bilateral cataracts removed, w/IOL   GASTRIC BYPASS  2008   JOINT REPLACEMENT     07/2010 &  2002- respectively- both knees    LEFT ATRIAL APPENDAGE OCCLUSION N/A 12/03/2021   Procedure: LEFT ATRIAL APPENDAGE OCCLUSION;  Surgeon: Wonda Sharper, MD;  Location: [facility] INVASIVE CV LAB;  Service: Cardiovascular;  Laterality: N/A;   MALONEY DILATION N/A 09/15/2017   Procedure: AGAPITO DILATION;  Surgeon: Hollingshead Lamar CHRISTELLA, MD;  [facility];  Service: Endoscopy;  Laterality: N/A;   MALONEY DILATION N/A 12/01/2022   Procedure: AGAPITO DILATION;  Surgeon: Hollingshead Lamar CHRISTELLA, MD;  Location[facility];  Service: Endoscopy;  Laterality: N/A;   PARATHYROIDECTOMY     PERIPHERALLY INSERTED CENTRAL CATHETER INSERTION     PICC  line for treatment of MRSA   POLYPECTOMY  05/12/2020   Procedure: POLYPECTOMY;  Surgeon: Hollingshead Lamar CHRISTELLA, MD;  [facility];  Service: Endoscopy;;   TEE WITHOUT CARDIOVERSION N/A 12/03/2021   Procedure: TRANSESOPHAGEAL ECHOCARDIOGRAM (TEE);  Surgeon: Wonda Sharper, MD;  Location: [facility] INVASIVE CV LAB;  Service: Cardiovascular;  Laterality: N/A;   TONSILLECTOMY     as a child    Current Outpatient Medications  Medication Sig Dispense Refill   acetaminophen  (TYLENOL ) 500 MG tablet Take 1,000 mg by mouth every 6 (six) hours as needed for mild pain (pain score 1-3).     alendronate  (FOSAMAX ) 70 MG tablet Take 1 tablet (70 mg total) by mouth every 7 (seven) days. Take with a full glass of water  on an empty stomach. 12 tablet 3   cyanocobalamin (VITAMIN B12) 1000 MCG/ML injection Inject 1,000 mcg into the muscle every 30 (thirty) days.     diltiazem  (CARDIZEM  CD) 180 MG 24 hr capsule Take 1 capsule (180 mg total) by mouth daily. 30 capsule 0   donepezil  (ARICEPT ) 10 MG tablet Take 1 tablet (10 mg total) by mouth at bedtime. 30 tablet 6   levothyroxine  (SYNTHROID , LEVOTHROID) 75 MCG tablet Take 75 mcg by mouth daily before breakfast.  lidocaine  (LIDODERM ) 5 % PLACE 1 PATCH ONTO THE SKIN DAILY. REMOVE & DISCARD PATCH WITHIN 12 HOURS OR AS DIRECTED BY MD 30 patch 0   ondansetron  (ZOFRAN ) 4 MG tablet Take 1 tablet (4 mg total) by mouth every 8 (eight) hours as needed for nausea or vomiting. 30 tablet 3   oxybutynin  (DITROPAN  XL) 15 MG 24 hr tablet Take 1 tablet (15 mg total) by mouth at bedtime. 90 tablet 1   pantoprazole  (PROTONIX ) 40 MG tablet TAKE 1 TABLET BY MOUTH TWICE A DAY 180 tablet 1   rosuvastatin  (CRESTOR ) 40 MG tablet Take 1 tablet (40 mg total) by mouth daily. 90 tablet 3   sucralfate  (CARAFATE ) 1 g tablet Take 1 tablet (1 g total) by mouth 4 (four) times daily. 120 tablet 0   sucralfate  (CARAFATE ) 1 GM/10ML suspension Take 10 mLs (1 g total) by mouth 4 (four) times daily -   with meals and at bedtime. 1200 mL 0   venlafaxine  (EFFEXOR ) 75 MG tablet Take 75 mg by mouth 2 (two) times daily.     Vitamin D , Ergocalciferol , (DRISDOL ) 1.25 MG (50000 UNIT) CAPS capsule Take 1 capsule (50,000 Units total) by mouth every Monday. 12 capsule 1   No current facility-administered medications for this visit.    Allergies as of 09/20/2023 - Review Complete 09/20/2023  Allergen Reaction Noted   Benzodiazepines Other (See Comments) 12/03/2011   Nsaids Other (See Comments) 12/22/2012   Statins Nausea And Vomiting    Codeine Nausea Only    Escitalopram oxalate Nausea Only 12/02/2010   Sertraline  hcl Nausea Only    Tramadol hcl Other (See Comments) 12/22/2012    Family History  Problem Relation Age of Onset   Anxiety disorder Mother    Breast cancer Mother    Heart attack Father    Anxiety disorder Sister    Breast cancer Sister    Bipolar disorder Daughter    OCD Daughter    Anesthesia problems Neg Hx    Hypotension Neg Hx    Malignant hyperthermia Neg Hx    Pseudochol deficiency Neg Hx    Dementia Neg Hx    Alcohol abuse Neg Hx    Drug abuse Neg Hx    Depression Neg Hx    Paranoid behavior Neg Hx    Schizophrenia Neg Hx    Seizures Neg Hx    Sexual abuse Neg Hx    Physical abuse Neg Hx    Colon cancer Neg Hx     Social History   Socioeconomic History   Marital status: Widowed    Spouse name: Not on file   Number of children: 4   Years of education: Not on file   Highest education level: Not on file  Occupational History   Not on file  Tobacco Use   Smoking status: Former    Current packs/day: 0.00    Average packs/day: 1 pack/day for 30.0 years (30.0 ttl pk-yrs)    Types: Cigarettes    Start date: 01/19/1968    Quit date: 01/18/1998    Years since quitting: 25.6   Smokeless tobacco: Never  Vaping Use   Vaping status: Never Used  Substance and Sexual Activity   Alcohol use: No   Drug use: No   Sexual activity: Not on file    Comment:  Hysterectomy  Other Topics Concern   Not on file  Social History Narrative   Not on file   Social Drivers of Corporate investment banker  Strain: Low Risk  (12/15/2022)   Overall Financial Resource Strain (CARDIA)    Difficulty of Paying Living Expenses: Not hard at all  Food Insecurity: No Food Insecurity (09/02/2023)   Hunger Vital Sign    Worried About Running Out of Food in the Last Year: Never true    Ran Out of Food in the Last Year: Never true  Transportation Needs: No Transportation Needs (09/02/2023)   PRAPARE - Administrator, Civil Service (Medical): No    Lack of Transportation (Non-Medical): No  Physical Activity: Inactive (12/15/2022)   Exercise Vital Sign    Days of Exercise per Week: 0 days    Minutes of Exercise per Session: 0 min  Stress: No Stress Concern Present (12/15/2022)   [employer] of Occupational Health - Occupational Stress Questionnaire    Feeling of Stress : Not at all  Social Connections: Moderately Isolated (09/02/2023)   Social Connection and Isolation Panel    Frequency of Communication with Friends and Family: More than three times a week    Frequency of Social Gatherings with Friends and Family: More than three times a week    Attends Religious Services: More than 4 times per year    Active Member of [employer] or Organizations: No    Attends Banker Meetings: Never    Marital Status: Widowed  Intimate Partner Violence: Not At Risk (09/02/2023)   Humiliation, Afraid, Rape, and Kick questionnaire    Fear of Current or Ex-Partner: No    Emotionally Abused: No    Physically Abused: No    Sexually Abused: No     Review of Systems   Limited due to memory issues.    Physical Exam   BP 106/74   Pulse 73   Temp 98.2 F (36.8 C)   Ht 5' 5 (1.651 m)   Wt 150 lb (68 kg)   BMI 24.96 kg/m  General:   Alert and oriented. Pleasant and cooperative. Chronically ill-appearing Head:  Normocephalic and atraumatic. Eyes:   Without icterus Abdomen:  +BS, soft, non-tender and non-distended. Sitting in wheelchair Rectal:  Deferred  Extremities:  Without edema. Neurologic:  Alert and  oriented to person, place ,situation . Skin:  Intact without significant lesions or rashes. Psych:  Alert and cooperative. Normal mood and affect.   Assessment   [name] is a 78 y.o. female presenting today with a history of gastric bypass, known anastomotic ulcer in 2016 and 2021, A-fib not on anticoagulation, hospitalized in August 2025 with abdominal pain, melena, and anemia s/p  EGD with large anastomotic ulcer and clot, presenting for hospital follow-up. Daughter, [name], is present with her today.   PUD: in setting of gastric bypass and as noted has had findings of this in 2016 and 2021. Now with large anastomotic ulcer and clot s/p injection during August hospitalization and will need surveillance EGD in future to rule out malignancy. Notably, she and daughter deny any NSAIDs currently or prior to hospitalization. Will continue on BID PPI for now.   Chronic abdominal pain: dating back to 2007 after gastric bypass. Known abdominal hernias, mesenteric vasculature patent on CTA Aug 2025. Suspect PUD contributing and discussed gastric bypass is ulcerogenic in nature as well. However, with her persistent ulcerative process, need to rule out malignancy and may need EUS depending on repeat EGD findings.   Acute blood loss anemia: presenting Hgb 8.5 during hospitalization, declining to 7 range and received 1 unit PRBCs with discharge Hgb 9.7.  I have ordered CBC and iron studies to be done and faxed to us .  Hx of multiple polyps 2022 and surveillance due this year. She would like to complete this at time of surveillance EGD.   Chronic constipation: can continue Miralax  daily.       PLAN    Proceed with colonoscopy  for polyp surveillance and EGD for ulcer surveillance by Dr. Shaaron in near future: the risks, benefits, and  alternatives have been discussed with the patient in detail. The patient states understanding and desires to proceed. PPI BID, carfate QID Miralax  daily Check updated CBC and iron studies Return in November   Therisa MICAEL Stager, PhD, ANP-BC [facility] Gastroenterology   CBC and iron studies completed 10/9 with Hgb 12, Platelets 124. Ferritin 29, down from 60, iron sats low at 20. She also had worsening renal failure and hypokalemia. Admitted to [facility]. Procedures canceled and daughter will call back when time to reschedule. Will arrange non-urgent follow-up.  Therisa MICAEL Stager, PhD, ANP-BC [facility] Gastroenterology

## 2023-09-20 NOTE — Patient Instructions (Addendum)
 I recommend taking Miralax  daily. If any diarrhea, can hold that day's dose.  Continue pantoprazole  twice a day and carafate  four times a day.   Please have blood work done and faxed to us .  If abdominal pain worsens at all, please call us .  We are arranging a colonoscopy and upper endoscopy around October 2025.   We will see you in follow-up in November!  I enjoyed seeing you again today! I value our relationship and want to provide genuine, compassionate, and quality care. You may receive a survey regarding your visit with me, and I welcome your feedback! Thanks so much for taking the time to complete this. I look forward to seeing you again.      Therisa MICAEL Stager, PhD, ANP-BC [facility] Gastroenterology

## 2023-09-22 DIAGNOSIS — Z79899 Other long term (current) drug therapy: Secondary | ICD-10-CM | POA: Diagnosis not present

## 2023-09-22 DIAGNOSIS — K284 Chronic or unspecified gastrojejunal ulcer with hemorrhage: Secondary | ICD-10-CM | POA: Diagnosis not present

## 2023-09-22 DIAGNOSIS — D62 Acute posthemorrhagic anemia: Secondary | ICD-10-CM | POA: Diagnosis not present

## 2023-09-28 ENCOUNTER — Encounter: Payer: Self-pay | Admitting: *Deleted

## 2023-09-28 ENCOUNTER — Other Ambulatory Visit: Payer: Self-pay | Admitting: *Deleted

## 2023-09-28 MED ORDER — PEG 3350-KCL-NA BICARB-NACL 420 G PO SOLR: 4000.0000 mL | Freq: Once | ORAL | 0 refills | Status: AC

## 2023-09-29 DIAGNOSIS — K284 Chronic or unspecified gastrojejunal ulcer with hemorrhage: Secondary | ICD-10-CM | POA: Diagnosis not present

## 2023-09-29 DIAGNOSIS — R4189 Other symptoms and signs involving cognitive functions and awareness: Secondary | ICD-10-CM | POA: Diagnosis not present

## 2023-09-29 DIAGNOSIS — F419 Anxiety disorder, unspecified: Secondary | ICD-10-CM | POA: Diagnosis not present

## 2023-09-29 DIAGNOSIS — I4891 Unspecified atrial fibrillation: Secondary | ICD-10-CM | POA: Diagnosis not present

## 2023-09-29 DIAGNOSIS — E785 Hyperlipidemia, unspecified: Secondary | ICD-10-CM | POA: Diagnosis not present

## 2023-09-29 DIAGNOSIS — D62 Acute posthemorrhagic anemia: Secondary | ICD-10-CM | POA: Diagnosis not present

## 2023-09-29 DIAGNOSIS — E039 Hypothyroidism, unspecified: Secondary | ICD-10-CM | POA: Diagnosis not present

## 2023-09-29 DIAGNOSIS — N1831 Chronic kidney disease, stage 3a: Secondary | ICD-10-CM | POA: Diagnosis not present

## 2023-09-29 DIAGNOSIS — N3281 Overactive bladder: Secondary | ICD-10-CM | POA: Diagnosis not present

## 2023-09-29 DIAGNOSIS — F32A Depression, unspecified: Secondary | ICD-10-CM | POA: Diagnosis not present

## 2023-09-29 DIAGNOSIS — Z79899 Other long term (current) drug therapy: Secondary | ICD-10-CM | POA: Diagnosis not present

## 2023-09-29 DIAGNOSIS — Z980 Intestinal bypass and anastomosis status: Secondary | ICD-10-CM | POA: Diagnosis not present

## 2023-10-03 ENCOUNTER — Telehealth: Payer: Self-pay | Admitting: Family Medicine

## 2023-10-03 NOTE — Telephone Encounter (Signed)
 Copied from CRM #[id number]. Topic: Referral - Request for Referral >> Oct 03, 2023 11:01 AM Corin V wrote: Did the patient discuss referral with their provider in the last year? Yes (If No - schedule appointment) (If Yes - send message)  Appointment offered? No- but she is coming in 10/18/23  Type of order/referral and detailed reason for visit: Urology referral for incontinence issues worsening  Preference of office, provider, location: [facility] in [place]- appointment is scheduled, they just need the referral  If referral order, have you been seen by this specialty before? No (If Yes, this issue or another issue? When? Where?  Can we respond through MyChart? No- call [phone]  For follow up

## 2023-10-03 NOTE — Telephone Encounter (Signed)
 Will send in referral day of appointment

## 2023-10-04 ENCOUNTER — Telehealth: Payer: Self-pay

## 2023-10-04 ENCOUNTER — Telehealth: Payer: Self-pay | Admitting: Internal Medicine

## 2023-10-04 DIAGNOSIS — R52 Pain, unspecified: Secondary | ICD-10-CM | POA: Diagnosis not present

## 2023-10-04 DIAGNOSIS — I129 Hypertensive chronic kidney disease with stage 1 through stage 4 chronic kidney disease, or unspecified chronic kidney disease: Secondary | ICD-10-CM | POA: Diagnosis not present

## 2023-10-04 DIAGNOSIS — K250 Acute gastric ulcer with hemorrhage: Secondary | ICD-10-CM | POA: Diagnosis not present

## 2023-10-04 DIAGNOSIS — I4891 Unspecified atrial fibrillation: Secondary | ICD-10-CM | POA: Diagnosis not present

## 2023-10-04 NOTE — Transitions of Care (Post Inpatient/ED Visit) (Signed)
 10/04/2023  Name: [name] MRN: [id number] DOB: 1945-03-24  Today's TOC FU Call Status: Today's TOC FU Call Status:: Successful TOC FU Call Completed TOC FU Call Complete Date: 10/04/23 Patient's Name and Date of Birth confirmed.  Transition Care Management Follow-up Telephone Call Date of Discharge: 10/03/23 Discharge Facility: Other [facility]) Name of Other (Non[facility]) Discharge Facility: [facility] Type of Discharge: Inpatient Admission Primary Inpatient Discharge Diagnosis:: GI bleed How have you been since you were released from the hospital?: Better Any questions or concerns?: Yes Patient Questions/Concerns:: needs referral sent to [facility] . Patient has an appt for 11/30/2023 dx incontinence Patient Questions/Concerns Addressed: Notified Provider of Patient Questions/Concerns  Items Reviewed: Did you receive and understand the discharge instructions provided?: Yes Medications obtained,verified, and reconciled?: Yes (Medications Reviewed) Any new allergies since your discharge?: No Dietary orders reviewed?: Yes Do you have support at home?: Yes People in Home [RPT]: child(ren), adult  Medications Reviewed Today: Medications Reviewed Today     Reviewed by Emmitt Pan, LPN (Licensed Practical Nurse) on 10/04/23 at [phone]  Med List Status: <None>   Medication Order Taking? Sig Documenting Provider Last Dose Status Informant  acetaminophen  (TYLENOL ) 500 MG tablet [id number] Yes Take 1,000 mg by mouth every 6 (six) hours as needed for mild pain (pain score 1-3). [provider]  Active Child, Pharmacy Records  alendronate  (FOSAMAX ) 70 MG tablet [id number] Yes Take 1 tablet (70 mg total) by mouth every 7 (seven) days. Take with a full glass of water  on an empty stomach. Del Wilhelmena Falter, Hilario, FNP  Active Child, Pharmacy Records  cyanocobalamin (VITAMIN B12) 1000 MCG/ML injection [id number] Yes Inject 1,000 mcg into the muscle every 30 (thirty)  days. [provider]  Active Child, Pharmacy Records  diltiazem  (CARDIZEM  CD) 180 MG 24 hr capsule [id number] Yes Take 1 capsule (180 mg total) by mouth daily. Sira, Zackery, MD  Active   donepezil  (ARICEPT ) 10 MG tablet [id number] Yes Take 1 tablet (10 mg total) by mouth at bedtime. Dohmeier, Dedra, MD  Active Child, Pharmacy Records  levothyroxine  (SYNTHROID , LEVOTHROID) 75 MCG tablet [id number] Yes Take 75 mcg by mouth daily before breakfast. [provider]  Active Child, Pharmacy Records  lidocaine  (LIDODERM ) 5 % [id number] Yes PLACE 1 PATCH ONTO THE SKIN DAILY. REMOVE & DISCARD PATCH WITHIN 12 HOURS OR AS DIRECTED BY MD Del Wilhelmena Falter Hilario, FNP  Active Child, Pharmacy Records  ondansetron  (ZOFRAN ) 4 MG tablet [id number] Yes Take 1 tablet (4 mg total) by mouth every 8 (eight) hours as needed for nausea or vomiting. Rudy Josette RAMAN, PA-C  Active Child, Pharmacy Records  oxybutynin  (DITROPAN  XL) 15 MG 24 hr tablet [id number] Yes Take 1 tablet (15 mg total) by mouth at bedtime. Del Wilhelmena Falter Hilario, FNP  Active Child, Pharmacy Records  pantoprazole  (PROTONIX ) 40 MG tablet [id number] Yes TAKE 1 TABLET BY MOUTH TWICE A DAY Rudy Josette RAMAN, PA-C  Active   rosuvastatin  (CRESTOR ) 40 MG tablet [id number] Yes Take 1 tablet (40 mg total) by mouth daily. Del Wilhelmena Falter, Hilario, FNP  Active Child, Pharmacy Records  sucralfate  (CARAFATE ) 1 g tablet [id number] Yes Take 1 tablet (1 g total) by mouth 4 (four) times daily. Sira, Zackery, MD  Active   sucralfate  (CARAFATE ) 1 GM/10ML suspension [id number] Yes Take 10 mLs (1 g total) by mouth 4 (four) times daily -  with meals and at bedtime. Sira, Zackery, MD  Active   venlafaxine  (EFFEXOR ) 75 MG tablet [id number] Yes Take  75 mg by mouth 2 (two) times daily. [provider]  Active Child, Pharmacy Records  Vitamin D , Ergocalciferol , (DRISDOL ) 1.25 MG (50000 UNIT) CAPS capsule [id number] Yes Take 1 capsule (50,000 Units total) by mouth  every Monday. Del Wilhelmena Lloyd Sola, FNP  Active Child, Pharmacy Records  Med List Note (Ward, Angelica, CPhT 09/02/23 1603): Pt's daughter handles medications            Home Care and Equipment/Supplies: Were Home Health Services Ordered?: Yes Name of Home Health Agency:: unknown Has Agency set up a time to come to your home?: Yes First Home Health Visit Date: 10/12/23 Any new equipment or medical supplies ordered?: Yes Name of Medical supply agency?: unknown Were you able to get the equipment/medical supplies?: No Do you have any questions related to the use of the equipment/supplies?: No  Functional Questionnaire: Do you need assistance with bathing/showering or dressing?: Yes Do you need assistance with meal preparation?: Yes Do you need assistance with eating?: No Do you have difficulty maintaining continence: Yes Do you need assistance with getting out of bed/getting out of a chair/moving?: No Do you have difficulty managing or taking your medications?: Yes  Follow up appointments reviewed: PCP Follow-up appointment confirmed?: Yes Date of PCP follow-up appointment?: 10/18/23 Follow-up Provider: [facility] Follow-up appointment confirmed?: Yes Date of Specialist follow-up appointment?: 11/09/23 Follow-Up Specialty Provider:: GI Do you need transportation to your follow-up appointment?: No Do you understand care options if your condition(s) worsen?: Yes-patient verbalized understanding    SIGNATURE Julian Lemmings, LPN [facility] Nurse Health Advisor Direct Dial [phone]

## 2023-10-04 NOTE — Telephone Encounter (Signed)
 [name] called and left a message that she would like to speak with someone re: the procedure she is scheduled for.  She has some questions...SABRASABRA  [phone]

## 2023-10-04 NOTE — Telephone Encounter (Signed)
 Spoke to [employer] (on dpr) and she just wanted to make sure her mom was scheduled for colonoscopy/egd. Advised crystal that her mom was scheduled for both.  She also wants to know if you wanted pt to continue on the Fosamax . She wasn't sure if you wanted her to stop it. She said that pt is at home now. Please advise. Thank you

## 2023-10-06 ENCOUNTER — Other Ambulatory Visit: Payer: Self-pay | Admitting: Family Medicine

## 2023-10-07 ENCOUNTER — Telehealth: Payer: Self-pay

## 2023-10-07 NOTE — Telephone Encounter (Signed)
 Copied from CRM [id number]. Topic: Clinical - Home Health Verbal Orders >> Oct 07, 2023  3:21 PM Delon DASEN wrote: Caller/Agency: Tully with [facility] Callback Number: [phone] Service Requested: Skilled Nursing, PT and OT Frequency: n/a Any new concerns about the patient? No- late start, had to verify provider- planning to start on Monday 9/22- rehab had incorrect information

## 2023-10-07 NOTE — Telephone Encounter (Signed)
 Verbal orders given to [facility]

## 2023-10-11 NOTE — Telephone Encounter (Signed)
 LMOVM to [name] (identifying herself on vm)  (pt's daughter on dpr) of providers message. If any questions to please call back.

## 2023-10-11 NOTE — Telephone Encounter (Signed)
 Her primary care will address fosamax . We will see what procedures show first.

## 2023-10-12 ENCOUNTER — Telehealth: Payer: Self-pay

## 2023-10-12 NOTE — Telephone Encounter (Signed)
 Copied from CRM #[id number]. Topic: Clinical - Home Health Verbal Orders >> Oct 12, 2023  1:39 PM Nathanel BROCKS wrote: Caller/Agency: [facility] Callback Number: [phone] Service Requested: Physical Therapy Frequency: 1time a week for 8 Any new concerns about the patient? No

## 2023-10-12 NOTE — Telephone Encounter (Signed)
 Verbal orders provided to [employer]

## 2023-10-16 ENCOUNTER — Telehealth: Payer: Self-pay | Admitting: Gastroenterology

## 2023-10-16 NOTE — Telephone Encounter (Signed)
 Mandy: at time of visit in early September, we had requested she complete a CBC and iron studies at [facility]. Can we request this?

## 2023-10-17 ENCOUNTER — Encounter (INDEPENDENT_AMBULATORY_CARE_PROVIDER_SITE_OTHER): Admitting: Ophthalmology

## 2023-10-17 DIAGNOSIS — H353132 Nonexudative age-related macular degeneration, bilateral, intermediate dry stage: Secondary | ICD-10-CM

## 2023-10-17 DIAGNOSIS — I10 Essential (primary) hypertension: Secondary | ICD-10-CM | POA: Diagnosis not present

## 2023-10-17 DIAGNOSIS — H43813 Vitreous degeneration, bilateral: Secondary | ICD-10-CM | POA: Diagnosis not present

## 2023-10-17 DIAGNOSIS — H35033 Hypertensive retinopathy, bilateral: Secondary | ICD-10-CM | POA: Diagnosis not present

## 2023-10-18 ENCOUNTER — Telehealth: Payer: Self-pay | Admitting: Family Medicine

## 2023-10-18 ENCOUNTER — Inpatient Hospital Stay: Admitting: Nurse Practitioner

## 2023-10-18 ENCOUNTER — Ambulatory Visit: Payer: Self-pay

## 2023-10-18 NOTE — Telephone Encounter (Unsigned)
 Copied from CRM #[id number]. Topic: Clinical - Home Health Verbal Orders >> Oct 18, 2023 10:00 AM Donee H wrote: Caller/Agency: Rosina Boos from [facility] Callback Number: [phone] Service Requested: Occupational Therapy Frequency: Requesting verbal order to evaluate patient this week due to not being able to evaluate patient last week.  Any new concerns about the patient? No

## 2023-10-18 NOTE — Telephone Encounter (Signed)
 This RN made the 1st attempt to contact patient. No answer, left voicemail with call back number.     Copied from CRM [id number]. Topic: Clinical - Pink Word Triage >> Oct 18, 2023  9:33 AM Turkey B wrote: Patient has memory issues and incontinence wirh urination on herself, has to wear pads

## 2023-10-18 NOTE — Telephone Encounter (Signed)
 Rosina provided with verbal orders

## 2023-10-18 NOTE — Telephone Encounter (Signed)
 FYI Only or Action Required?: FYI only for provider. Daughter refused sooner appt due to schedule. Will go to UC or call back if symptoms become worse  Patient was last seen in primary care on 05/12/2023 by Del Wilhelmena Lloyd Sola, FNP.  Called Nurse Triage reporting Memory Loss and Urinary Incontinence.  Symptoms began several months ago.  Interventions attempted: Rest, hydration, or home remedies.  Symptoms are: gradually worsening.  Triage Disposition: See PCP Within 2 Weeks, See Physician Within 24 Hours  Patient/caregiver understands and will follow disposition?: No, refuses disposition   Copied from CRM [id number]. Topic: Clinical - Pink Word Triage >> Oct 18, 2023  9:33 AM Turkey B wrote: Patient has memory issues and incontinence wirh urination on herself, has to wear pads Reason for Disposition  Urinating more frequently than usual (i.e., frequency) OR new-onset of the feeling of an urgent need to urinate (i.e., urgency)  New or worsening memory (forgetfulness) problems  Answer Assessment - Initial Assessment Questions Worsening memory. Follows with Neurology. Concerned for possible UTI worsening symptoms. Eye doctor thinks worsening vision is a cognitive symptom   1. MAIN CONCERN OR SYMPTOM:  What is your main concern right now? What questions do you have? What's the main symptom you're worried about? (e.g., confusion, memory loss)     Cognitive decline- follows Neurology 2. ONSET:  When did the symptom start (or worsen)? (minutes, hours, days, weeks)     A few weeks since she was D/Cd from inpatient for Ulcer 3. BETTER-SAME-WORSE: Are you (the patient) getting better, staying the same, or getting worse compared to the day you (they) were diagnosed or most recent hospital discharge?     Getting worse in last 2 weeks  4. DIAGNOSIS: Was the dementia diagnosed by a doctor? If Yes, ask: When? (e.g., days, months, years ago)     Sees Neurology 5. MEDICINES: Has  there been any change in medicines recently? (e.g., narcotics, antihistamines, benzodiazepines, etc.)     Taking Donepezil  and toelrates, Sulcralfate added in August for Ulcer 6. OTHER SYMPTOMS: Are there any other symptoms? (e.g., cough, falling, fever, pain)     Urinary incontinence  Answer Assessment - Initial Assessment Questions Has history of incontinence but getting worse/ more frequent. Unable to hold her urine at night. Daughter has her using an incontinence pad instead of the diaper they had her in during admission to limit sitting in it. Feels patient is no longer making the effort to go to the restroom on her own at night. Decreased appetite. Advised needs to be seen to rule out UTI. Patient was supposed to have an appt today but got cancelled and they were not notified. Daughter unable to bring patient in until 10/9 on next off day. UC/ED precautions advised.  1. SYMPTOM: What's the main symptom you're concerned about? (e.g., frequency, incontinence)     Worsened incontinence, yellow discharge 2. ONSET: When did the  incontinence  start?     Ongoing but getting worse 3. PAIN: Is there any pain? If Yes, ask: How bad is it? (Scale: 1-10; mild, moderate, severe)     Pain occasionally- mild  4. CAUSE: What do you think is causing the symptoms?     Possible UTI 5. OTHER SYMPTOMS: Do you have any other symptoms? (e.g., blood in urine, fever, flank pain, pain with urination)     Denies fever, bad smell, and flank pain. Endorses decreased appetite.  Protocols used: Dementia Symptoms and Questions-A-AH, Urinary Symptoms-A-AH

## 2023-10-24 ENCOUNTER — Telehealth: Payer: Self-pay

## 2023-10-24 NOTE — Telephone Encounter (Signed)
 Copied from CRM [id number]. Topic: Clinical - Home Health Verbal Orders >> Oct 21, 2023  3:01 PM Winona SAUNDERS wrote: [facility] would like verbal orders to resch PT and Occupational health for next week as the daughter has limited avail.  [phone]

## 2023-10-24 NOTE — Telephone Encounter (Signed)
 Left message with nicole, to have anna call back for verbal orders, needing to report iliana oof and meade will be on call

## 2023-10-27 ENCOUNTER — Ambulatory Visit (INDEPENDENT_AMBULATORY_CARE_PROVIDER_SITE_OTHER): Payer: Self-pay | Admitting: Family Medicine

## 2023-10-27 ENCOUNTER — Telehealth: Payer: Self-pay

## 2023-10-27 ENCOUNTER — Encounter: Payer: Self-pay | Admitting: Family Medicine

## 2023-10-27 VITALS — BP 132/85 | HR 80 | Ht 65.0 in

## 2023-10-27 DIAGNOSIS — R300 Dysuria: Secondary | ICD-10-CM

## 2023-10-27 DIAGNOSIS — N179 Acute kidney failure, unspecified: Secondary | ICD-10-CM | POA: Diagnosis not present

## 2023-10-27 DIAGNOSIS — N1831 Chronic kidney disease, stage 3a: Secondary | ICD-10-CM

## 2023-10-27 DIAGNOSIS — E785 Hyperlipidemia, unspecified: Secondary | ICD-10-CM | POA: Insufficient documentation

## 2023-10-27 DIAGNOSIS — D509 Iron deficiency anemia, unspecified: Secondary | ICD-10-CM | POA: Diagnosis not present

## 2023-10-27 NOTE — Telephone Encounter (Signed)
 Copied from CRM [id number]. Topic: General - Other >> Oct 27, 2023 11:25 AM Joesph NOVAK wrote: Reason for CRM: [place] from [facility] is calling to report the patients vitals. Pain levels 8/10 adominal pain. Her other vitals look good.   [phone].

## 2023-10-27 NOTE — Patient Instructions (Signed)
 Labs today.  Bring back urine specimen.  Follow up in 1-3 months.  Take care  Dr. Bluford

## 2023-10-28 ENCOUNTER — Telehealth: Payer: Self-pay

## 2023-10-28 ENCOUNTER — Ambulatory Visit: Payer: Self-pay | Admitting: Family Medicine

## 2023-10-28 ENCOUNTER — Observation Stay (HOSPITAL_COMMUNITY)
Admission: EM | Admit: 2023-10-28 | Discharge: 2023-10-31 | Disposition: A | Discharge status: Home Health | Attending: Internal Medicine | Admitting: Internal Medicine

## 2023-10-28 DIAGNOSIS — I4819 Other persistent atrial fibrillation: Secondary | ICD-10-CM | POA: Diagnosis present

## 2023-10-28 DIAGNOSIS — Z8711 Personal history of peptic ulcer disease: Secondary | ICD-10-CM | POA: Diagnosis not present

## 2023-10-28 DIAGNOSIS — F32A Depression, unspecified: Secondary | ICD-10-CM | POA: Insufficient documentation

## 2023-10-28 DIAGNOSIS — E039 Hypothyroidism, unspecified: Secondary | ICD-10-CM | POA: Diagnosis not present

## 2023-10-28 DIAGNOSIS — N179 Acute kidney failure, unspecified: Principal | ICD-10-CM | POA: Diagnosis present

## 2023-10-28 DIAGNOSIS — Z87891 Personal history of nicotine dependence: Secondary | ICD-10-CM | POA: Diagnosis not present

## 2023-10-28 DIAGNOSIS — N1831 Chronic kidney disease, stage 3a: Secondary | ICD-10-CM | POA: Diagnosis present

## 2023-10-28 DIAGNOSIS — E785 Hyperlipidemia, unspecified: Secondary | ICD-10-CM | POA: Diagnosis present

## 2023-10-28 DIAGNOSIS — Z79890 Hormone replacement therapy (postmenopausal): Secondary | ICD-10-CM | POA: Insufficient documentation

## 2023-10-28 DIAGNOSIS — Z79899 Other long term (current) drug therapy: Secondary | ICD-10-CM | POA: Insufficient documentation

## 2023-10-28 DIAGNOSIS — Z95818 Presence of other cardiac implants and grafts: Secondary | ICD-10-CM | POA: Diagnosis not present

## 2023-10-28 DIAGNOSIS — I129 Hypertensive chronic kidney disease with stage 1 through stage 4 chronic kidney disease, or unspecified chronic kidney disease: Secondary | ICD-10-CM | POA: Insufficient documentation

## 2023-10-28 DIAGNOSIS — M6281 Muscle weakness (generalized): Secondary | ICD-10-CM | POA: Diagnosis not present

## 2023-10-28 DIAGNOSIS — F419 Anxiety disorder, unspecified: Secondary | ICD-10-CM | POA: Diagnosis not present

## 2023-10-28 DIAGNOSIS — F0390 Unspecified dementia without behavioral disturbance: Secondary | ICD-10-CM | POA: Diagnosis not present

## 2023-10-28 DIAGNOSIS — Z9884 Bariatric surgery status: Secondary | ICD-10-CM | POA: Diagnosis not present

## 2023-10-28 DIAGNOSIS — E876 Hypokalemia: Secondary | ICD-10-CM | POA: Diagnosis not present

## 2023-10-28 DIAGNOSIS — R799 Abnormal finding of blood chemistry, unspecified: Secondary | ICD-10-CM | POA: Diagnosis present

## 2023-10-28 LAB — CBC WITH DIFFERENTIAL/PLATELET
Abs Immature Granulocytes: 0.02 K/uL (ref 0.00–0.07)
Basophils Absolute: 0 K/uL (ref 0.0–0.1)
Basophils Relative: 0 %
Eosinophils Absolute: 0.2 K/uL (ref 0.0–0.5)
Eosinophils Relative: 4 %
HCT: 38.3 % (ref 36.0–46.0)
Hemoglobin: 12.3 g/dL (ref 12.0–15.0)
Immature Granulocytes: 0 %
Lymphocytes Relative: 20 %
Lymphs Abs: 1.2 K/uL (ref 0.7–4.0)
MCH: 29.5 pg (ref 26.0–34.0)
MCHC: 32.1 g/dL (ref 30.0–36.0)
MCV: 91.8 fL (ref 80.0–100.0)
Monocytes Absolute: 0.4 K/uL (ref 0.1–1.0)
Monocytes Relative: 7 %
Neutro Abs: 4.1 K/uL (ref 1.7–7.7)
Neutrophils Relative %: 69 %
Platelets: 111 K/uL — ABNORMAL LOW (ref 150–400)
RBC: 4.17 MIL/uL (ref 3.87–5.11)
RDW: 14.9 % (ref 11.5–15.5)
WBC: 6 K/uL (ref 4.0–10.5)
nRBC: 0 % (ref 0.0–0.2)

## 2023-10-28 LAB — CMP14+EGFR
ALT: 13 IU/L (ref 0–32)
AST: 25 IU/L (ref 0–40)
Albumin: 3.9 g/dL (ref 3.8–4.8)
Alkaline Phosphatase: 60 IU/L (ref 49–135)
BUN/Creatinine Ratio: 9 — ABNORMAL LOW (ref 12–28)
BUN: 18 mg/dL (ref 8–27)
Bilirubin Total: 0.6 mg/dL (ref 0.0–1.2)
CO2: 26 mmol/L (ref 20–29)
Calcium: 9.2 mg/dL (ref 8.7–10.3)
Chloride: 97 mmol/L (ref 96–106)
Creatinine, Ser: 2.01 mg/dL — ABNORMAL HIGH (ref 0.57–1.00)
Globulin, Total: 2 g/dL (ref 1.5–4.5)
Glucose: 134 mg/dL — ABNORMAL HIGH (ref 70–99)
Potassium: 2.8 mmol/L — ABNORMAL LOW (ref 3.5–5.2)
Sodium: 141 mmol/L (ref 134–144)
Total Protein: 5.9 g/dL — ABNORMAL LOW (ref 6.0–8.5)
eGFR: 25 mL/min/1.73 — ABNORMAL LOW (ref >59)

## 2023-10-28 LAB — CBC
Hematocrit: 38.3 % (ref 34.0–46.6)
Hemoglobin: 12 g/dL (ref 11.1–15.9)
MCH: 28.9 pg (ref 26.6–33.0)
MCHC: 31.3 g/dL — ABNORMAL LOW (ref 31.5–35.7)
MCV: 92 fL (ref 79–97)
Platelets: 124 x10E3/uL — ABNORMAL LOW (ref 150–450)
RBC: 4.15 x10E6/uL (ref 3.77–5.28)
RDW: 13.3 % (ref 11.7–15.4)
WBC: 6.8 x10E3/uL (ref 3.4–10.8)

## 2023-10-28 LAB — URINALYSIS, ROUTINE W REFLEX MICROSCOPIC
Bilirubin Urine: NEGATIVE
Glucose, UA: NEGATIVE mg/dL
Hgb urine dipstick: NEGATIVE
Ketones, ur: NEGATIVE mg/dL
Leukocytes,Ua: NEGATIVE
Nitrite: NEGATIVE
Protein, ur: 100 mg/dL — AB
Specific Gravity, Urine: 1.014 (ref 1.005–1.030)
pH: 6 (ref 5.0–8.0)

## 2023-10-28 LAB — IRON,TIBC AND FERRITIN PANEL
Ferritin: 29 ng/mL (ref 15–150)
Iron Saturation: 20 % (ref 15–55)
Iron: 58 ug/dL (ref 27–139)
Total Iron Binding Capacity: 289 ug/dL (ref 250–450)
UIBC: 231 ug/dL (ref 118–369)

## 2023-10-28 LAB — COMPREHENSIVE METABOLIC PANEL WITH GFR
ALT: 11 U/L (ref 0–44)
AST: 26 U/L (ref 15–41)
Albumin: 3.7 g/dL (ref 3.5–5.0)
Alkaline Phosphatase: 54 U/L (ref 38–126)
Anion gap: 14 (ref 5–15)
BUN: 20 mg/dL (ref 8–23)
CO2: 31 mmol/L (ref 22–32)
Calcium: 9.2 mg/dL (ref 8.9–10.3)
Chloride: 100 mmol/L (ref 98–111)
Creatinine, Ser: 2.14 mg/dL — ABNORMAL HIGH (ref 0.44–1.00)
GFR, Estimated: 23 mL/min — ABNORMAL LOW (ref >60)
Glucose, Bld: 129 mg/dL — ABNORMAL HIGH (ref 70–99)
Potassium: 2.6 mmol/L — CL (ref 3.5–5.1)
Sodium: 144 mmol/L (ref 135–145)
Total Bilirubin: 0.5 mg/dL (ref 0.0–1.2)
Total Protein: 5.9 g/dL — ABNORMAL LOW (ref 6.5–8.1)

## 2023-10-28 LAB — PHOSPHORUS: Phosphorus: 3.2 mg/dL (ref 2.5–4.6)

## 2023-10-28 LAB — MAGNESIUM: Magnesium: 2.3 mg/dL (ref 1.7–2.4)

## 2023-10-28 MED ORDER — OXYBUTYNIN CHLORIDE ER 5 MG PO TB24
15.0000 mg | ORAL_TABLET | Freq: Every day | ORAL | Status: DC
  Administered 2023-10-28 – 2023-10-30 (×3): 15 mg via ORAL
  Filled 2023-10-28 (×3): qty 3

## 2023-10-28 MED ORDER — VITAMIN D (ERGOCALCIFEROL) 1.25 MG (50000 UNIT) PO CAPS
50000.0000 [IU] | ORAL_CAPSULE | ORAL | Status: DC
  Administered 2023-10-31: 50000 [IU] via ORAL
  Filled 2023-10-28: qty 1

## 2023-10-28 MED ORDER — ACETAMINOPHEN 650 MG RE SUPP: 650.0000 mg | Freq: Four times a day (QID) | RECTAL | Status: DC | PRN

## 2023-10-28 MED ORDER — POTASSIUM CHLORIDE CRYS ER 20 MEQ PO TBCR
40.0000 meq | EXTENDED_RELEASE_TABLET | Freq: Four times a day (QID) | ORAL | Status: AC
  Administered 2023-10-28 – 2023-10-29 (×2): 40 meq via ORAL
  Filled 2023-10-28 (×2): qty 2

## 2023-10-28 MED ORDER — LACTATED RINGERS IV SOLN: INTRAVENOUS | Status: AC

## 2023-10-28 MED ORDER — ROSUVASTATIN CALCIUM 20 MG PO TABS
40.0000 mg | ORAL_TABLET | Freq: Every day | ORAL | Status: DC
Start: 2023-10-29 — End: 2023-10-31
  Administered 2023-10-29 – 2023-10-31 (×3): 40 mg via ORAL
  Filled 2023-10-28 (×3): qty 2

## 2023-10-28 MED ORDER — POTASSIUM CHLORIDE 10 MEQ/100ML IV SOLN
10.0000 meq | Freq: Once | INTRAVENOUS | Status: AC
  Administered 2023-10-29: 10 meq via INTRAVENOUS
  Filled 2023-10-28: qty 100

## 2023-10-28 MED ORDER — POTASSIUM CHLORIDE CRYS ER 20 MEQ PO TBCR
40.0000 meq | EXTENDED_RELEASE_TABLET | Freq: Once | ORAL | Status: AC
  Administered 2023-10-28: 40 meq via ORAL
  Filled 2023-10-28: qty 2

## 2023-10-28 MED ORDER — MAGNESIUM SULFATE IN D5W 1-5 GM/100ML-% IV SOLN
1.0000 g | Freq: Once | INTRAVENOUS | Status: AC
  Administered 2023-10-28: 1 g via INTRAVENOUS
  Filled 2023-10-28: qty 100

## 2023-10-28 MED ORDER — ACETAMINOPHEN 325 MG PO TABS
650.0000 mg | ORAL_TABLET | Freq: Four times a day (QID) | ORAL | Status: DC | PRN
  Administered 2023-10-30: 650 mg via ORAL
  Filled 2023-10-28: qty 2

## 2023-10-28 MED ORDER — LACTATED RINGERS IV BOLUS
1000.0000 mL | Freq: Once | INTRAVENOUS | Status: AC
  Administered 2023-10-28: 1000 mL via INTRAVENOUS

## 2023-10-28 MED ORDER — SUCRALFATE 1 G PO TABS
1.0000 g | ORAL_TABLET | Freq: Four times a day (QID) | ORAL | Status: DC
  Administered 2023-10-28 – 2023-10-31 (×11): 1 g via ORAL
  Filled 2023-10-28 (×11): qty 1

## 2023-10-28 MED ORDER — ATENOLOL 25 MG PO TABS
50.0000 mg | ORAL_TABLET | Freq: Every day | ORAL | Status: DC
  Administered 2023-10-29 – 2023-10-31 (×3): 50 mg via ORAL
  Filled 2023-10-28 (×3): qty 2

## 2023-10-28 MED ORDER — PANTOPRAZOLE SODIUM 40 MG PO TBEC
40.0000 mg | DELAYED_RELEASE_TABLET | Freq: Two times a day (BID) | ORAL | Status: DC
  Administered 2023-10-28 – 2023-10-31 (×6): 40 mg via ORAL
  Filled 2023-10-28 (×6): qty 1

## 2023-10-28 MED ORDER — HYDRALAZINE HCL 20 MG/ML IJ SOLN: 5.0000 mg | INTRAMUSCULAR | Status: DC | PRN

## 2023-10-28 MED ORDER — DILTIAZEM HCL ER COATED BEADS 180 MG PO CP24
180.0000 mg | ORAL_CAPSULE | Freq: Every evening | ORAL | Status: DC
  Administered 2023-10-28 – 2023-10-30 (×3): 180 mg via ORAL
  Filled 2023-10-28 (×3): qty 1

## 2023-10-28 MED ORDER — DONEPEZIL HCL 5 MG PO TABS
10.0000 mg | ORAL_TABLET | Freq: Every day | ORAL | Status: DC
  Administered 2023-10-28 – 2023-10-30 (×3): 10 mg via ORAL
  Filled 2023-10-28 (×3): qty 2

## 2023-10-28 MED ORDER — POTASSIUM CHLORIDE 10 MEQ/100ML IV SOLN
10.0000 meq | INTRAVENOUS | Status: AC
  Administered 2023-10-28 (×3): 10 meq via INTRAVENOUS
  Filled 2023-10-28 (×3): qty 100

## 2023-10-28 MED ORDER — HEPARIN SODIUM (PORCINE) 5000 UNIT/ML IJ SOLN
5000.0000 [IU] | Freq: Three times a day (TID) | INTRAMUSCULAR | Status: DC
  Administered 2023-10-29 – 2023-10-31 (×6): 5000 [IU] via SUBCUTANEOUS
  Filled 2023-10-28 (×6): qty 1

## 2023-10-28 MED ORDER — LEVOTHYROXINE SODIUM 75 MCG PO TABS
75.0000 ug | ORAL_TABLET | Freq: Every day | ORAL | Status: DC
  Administered 2023-10-29 – 2023-10-31 (×3): 75 ug via ORAL
  Filled 2023-10-28 (×3): qty 1

## 2023-10-28 NOTE — ED Notes (Signed)
 Date and time results received: 10/28/23 1649 (use smartphrase .now to insert current time)  Test: K  Critical Value: 2.6  Name of Provider Notified: Francesca

## 2023-10-28 NOTE — H&P (Signed)
 [facility] H&P   Patient Demographics:    [name], is a 78 y.o. female  MRN: [id number]   DOB - May 28, 1945  Admit Date - 10/28/2023  Outpatient Primary MD for the patient is Del Wilhelmena Lloyd Sola, FNP  Referring MD/NP/PA: PA Celeste  Patient coming from: home  Chief Complaint  Patient presents with   abnormal labs    Low K+ and renal function      HPI:    [name]  is a 78 y.o. female,  with medical history significant for atrial fibrillation status post Watchman device placement no longer anticoagulated, CKD 3A, dementia, depression, and anxiety, with recent hospitalization last August for acute blood loss anemia in the setting of gastrointestinal anastomotic ulcer. - Patient was sent to ED for abnormal labs, patient was discharged from subacute rehab few weeks ago, lives at home with her daughter, with poor historian, so history was obtained from ED staff as was unable to reach daughter by the phone, after discharge from facility she had some improvement but appears her progression has stalled, they had a PCP follow-up yesterday due to generalized weakness, she had labs done she was called to come to ED for low potassium and AKI, patient has been having diarrhea for several days, daughter reports because she gave her MiraLAX  because of she had significant constipation prior to that.  Been having chronic abdominal pain since her gastric bypass in 2008, and her abdominal pain is unchanged from baseline, patient reports polyuria and dysuria as well - In ED creatinine elevated at 2.14 from baseline of 1.35, potassium is low at 2.6, no leukocytosis, [facility] hospitalist consulted to admit.   Review of systems:     A full 10 point Review of Systems was done, except as stated above, all other Review of Systems were negative.   With Past History of the following :    Past Medical  History:  Diagnosis Date   Anemia    Anxiety    Arthritis    Chronic back pain    Spondylosis and stenosis   Depression    GERD (gastroesophageal reflux disease)    Headache(784.0)    Hiatal hernia    History of blood transfusion    History of GI bleed    Hypothyroidism    Mitral regurgitation    Obsessive-compulsive disorder    Persistent atrial fibrillation (HCC)    Presence of Watchman left atrial appendage closure device 12/03/2021   31mm Watchman FLX with Dr. Wonda   Recurrent upper respiratory infection (URI)    Sleep apnea       Past Surgical History:  Procedure Laterality Date   ABDOMINAL HYSTERECTOMY     ANTERIOR CERVICAL DECOMP/DISCECTOMY FUSION  12/09/2010   Procedure: ANTERIOR CERVICAL DECOMPRESSION/DISCECTOMY FUSION 3 LEVELS;  Surgeon: Reyes JONETTA Budge;  Location: [facility];  Service: Neurosurgery;  Laterality: N/A;  Cervical three-four,Cervical  four-five,Cervical Five-Six,Cervical Six-Seven ANTERIOR CERVICAL DECOMPRESSION WITH FUSION INTERBODY PROTHESIS PLATING AND BONEGRAFT   APPENDECTOMY     BIOPSY N/A 11/14/2014   Procedure: GASTRIC BIOPSY;  Surgeon: Lamar CHRISTELLA Hollingshead, MD;  Location: [facility];  Service: Endoscopy;  Laterality: N/A;   CESAREAN SECTION     CHOLECYSTECTOMY     COLONOSCOPY WITH PROPOFOL  N/A 11/14/2014   RMR: redundant colon but otherwise normal   COLONOSCOPY WITH PROPOFOL  N/A 05/12/2020   Dr. Hollingshead;  Seven 4 to 7 mm polyps, diverticulosis in the sigmoid colon. Pathology with tubular adenomas. Recommended repeat in 3 years.   ESOPHAGOGASTRODUODENOSCOPY N/A 09/02/2023   Procedure: EGD (ESOPHAGOGASTRODUODENOSCOPY);  Surgeon: Cinderella Deatrice FALCON, MD;  Location: [facility];  Service: Endoscopy;  Laterality: N/A;   ESOPHAGOGASTRODUODENOSCOPY (EGD) WITH PROPOFOL  N/A 11/14/2014   RMRs/p gastric surgery/anastomtic ulcer   ESOPHAGOGASTRODUODENOSCOPY (EGD) WITH PROPOFOL  N/A 09/15/2017   Procedure: ESOPHAGOGASTRODUODENOSCOPY (EGD) WITH PROPOFOL ;  Surgeon:  Hollingshead Lamar CHRISTELLA, MD;  Location: [facility];  Service: Endoscopy;  Laterality: N/A;  11:30am   ESOPHAGOGASTRODUODENOSCOPY (EGD) WITH PROPOFOL  N/A 04/04/2019   2 cm ulcer crater at anastomosis with smaller satellite areas of ulceration in setting of NSAIDs/Goody powders, no bleeding stigmata.   ESOPHAGOGASTRODUODENOSCOPY (EGD) WITH PROPOFOL  N/A 06/13/2019   Rourk: Prior gastric bypass surgery with Billroth II type anatomy, otherwise normal-appearing residual upper GI tract.   ESOPHAGOGASTRODUODENOSCOPY (EGD) WITH PROPOFOL  N/A 12/01/2022   Procedure: ESOPHAGOGASTRODUODENOSCOPY (EGD) WITH PROPOFOL ;  Surgeon: Hollingshead Lamar CHRISTELLA, MD;  Location: [facility];  Service: Endoscopy;  Laterality: N/A;  830am, asa 3   EYE SURGERY     bilateral cataracts removed, w/IOL   GASTRIC BYPASS  2008   JOINT REPLACEMENT     07/2010 &  2002- respectively- both knees    LEFT ATRIAL APPENDAGE OCCLUSION N/A 12/03/2021   Procedure: LEFT ATRIAL APPENDAGE OCCLUSION;  Surgeon: Wonda Sharper, MD;  Location: [facility] INVASIVE CV LAB;  Service: Cardiovascular;  Laterality: N/A;   MALONEY DILATION N/A 09/15/2017   Procedure: AGAPITO DILATION;  Surgeon: Hollingshead Lamar CHRISTELLA, MD;  Location: [facility];  Service: Endoscopy;  Laterality: N/A;   MALONEY DILATION N/A 12/01/2022   Procedure: AGAPITO DILATION;  Surgeon: Hollingshead Lamar CHRISTELLA, MD;  Location: [facility];  Service: Endoscopy;  Laterality: N/A;   PARATHYROIDECTOMY     PERIPHERALLY INSERTED CENTRAL CATHETER INSERTION     PICC line for treatment of MRSA   POLYPECTOMY  05/12/2020   Procedure: POLYPECTOMY;  Surgeon: Hollingshead Lamar CHRISTELLA, MD;  Location: [facility];  Service: Endoscopy;;   TEE WITHOUT CARDIOVERSION N/A 12/03/2021   Procedure: TRANSESOPHAGEAL ECHOCARDIOGRAM (TEE);  Surgeon: Wonda Sharper, MD;  Location[facility] INVASIVE CV LAB;  Service: Cardiovascular;  Laterality: N/A;   TONSILLECTOMY     as a child      Social History:     Social History   Tobacco Use    Smoking status: Former    Current packs/day: 0.00    Average packs/day: 1 pack/day for 30.0 years (30.0 ttl pk-yrs)    Types: Cigarettes    Start date: 01/19/1968    Quit date: 01/18/1998    Years since quitting: 25.7   Smokeless tobacco: Never  Substance Use Topics   Alcohol use: No       Family History :     Family History  Problem Relation Age of Onset   Anxiety disorder Mother    Breast cancer Mother    Heart attack Father    Anxiety disorder  Sister    Breast cancer Sister    Bipolar disorder Daughter    OCD Daughter    Anesthesia problems Neg Hx    Hypotension Neg Hx    Malignant hyperthermia Neg Hx    Pseudochol deficiency Neg Hx    Dementia Neg Hx    Alcohol abuse Neg Hx    Drug abuse Neg Hx    Depression Neg Hx    Paranoid behavior Neg Hx    Schizophrenia Neg Hx    Seizures Neg Hx    Sexual abuse Neg Hx    Physical abuse Neg Hx    Colon cancer Neg Hx       Home Medications:   Prior to Admission medications   Medication Sig Start Date End Date Taking? Authorizing Provider  acetaminophen  (TYLENOL ) 500 MG tablet Take 1,000 mg by mouth every 6 (six) hours as needed for mild pain (pain score 1-3).   Yes [provider]  alendronate  (FOSAMAX ) 70 MG tablet Take 1 tablet (70 mg total) by mouth every 7 (seven) days. Take with a full glass of water  on an empty stomach. Patient taking differently: Take 70 mg by mouth every Monday. Take with a full glass of water  on an empty stomach. 06/10/23  Yes Del Wilhelmena Falter, [place], FNP  atenolol  (TENORMIN ) 50 MG tablet Take 50 mg by mouth daily. 10/13/23  Yes [provider]  cyanocobalamin (VITAMIN B12) 1000 MCG/ML injection Inject 1,000 mcg into the muscle every 30 (thirty) days.   Yes [provider]  diltiazem  (CARDIZEM  CD) 180 MG 24 hr capsule Take 1 capsule (180 mg total) by mouth daily. Patient taking differently: Take 180 mg by mouth every evening. 09/06/23 10/28/23 Yes Sira, Zackery, MD  donepezil   (ARICEPT ) 10 MG tablet Take 1 tablet (10 mg total) by mouth at bedtime. 08/18/23  Yes Dohmeier, Dedra, MD  levothyroxine  (SYNTHROID , LEVOTHROID) 75 MCG tablet Take 75 mcg by mouth daily before breakfast.   Yes [provider]  lidocaine  (LIDODERM ) 5 % PLACE 1 PATCH ONTO THE SKIN DAILY. REMOVE & DISCARD PATCH WITHIN 12 HOURS OR AS DIRECTED BY MD 08/23/23  Yes Del Wilhelmena Falter, Hilario, FNP  ondansetron  (ZOFRAN ) 4 MG tablet Take 1 tablet (4 mg total) by mouth every 8 (eight) hours as needed for nausea or vomiting. 06/08/23  Yes Rudy Josette RAMAN, PA-C  oxybutynin  (DITROPAN  XL) 15 MG 24 hr tablet Take 1 tablet (15 mg total) by mouth at bedtime. 05/12/23  Yes Del Orbe Polanco, Hilario, FNP  pantoprazole  (PROTONIX ) 40 MG tablet TAKE 1 TABLET BY MOUTH TWICE A DAY 09/07/23  Yes Rudy, Kristen S, PA-C  rosuvastatin  (CRESTOR ) 40 MG tablet Take 1 tablet (40 mg total) by mouth daily. 05/18/23  Yes Del Wilhelmena Falter, Hilario, FNP  sucralfate  (CARAFATE ) 1 g tablet Take 1 tablet (1 g total) by mouth 4 (four) times daily. 09/06/23 10/28/23 Yes Sira, Zackery, MD  venlafaxine  (EFFEXOR ) 75 MG tablet TAKE 1 TABLET BY MOUTH TWICE A DAY 10/06/23  Yes Del Wilhelmena Falter, [place], FNP  Vitamin D , Ergocalciferol , (DRISDOL ) 1.25 MG (50000 UNIT) CAPS capsule Take 1 capsule (50,000 Units total) by mouth every Monday. 05/16/23  Yes Del Orbe Polanco, Hilario, FNP  polyethylene glycol-electrolytes (NULYTELY ) 420 g solution Take 4,000 mLs by mouth once. 09/28/23   [provider]     Allergies:     Allergies  Allergen Reactions   Benzodiazepines Other (See Comments)    Cloudy thinking, memory loss, withdrawal symptoms when trying  to stop without any other medication for detox.    Nsaids Other (See Comments)    Bleeding ulcer    Statins Nausea And Vomiting   Codeine Nausea Only   Escitalopram Oxalate Nausea Only   Sertraline  Hcl Nausea Only   Tramadol Hcl Other (See Comments)    Daughter doesn't want mother to take due  to the fact that the medication made the daughter have a seizure      Physical Exam:   Vitals  Blood pressure (!) 151/85, pulse 85, temperature 98.4 F (36.9 C), temperature source Oral, resp. rate 20, height 5' 5 (1.651 m), weight 68 kg, SpO2 95%.   1. General Frail, deconditioned elderly female, laying in bed in no apparent distress  2.  Awake, alert, appropriate, but she is poor historian, forgetful  3. No F.N deficits, ALL C.Nerves Intact, Strength 5/5 all 4 extremities, Sensation intact all 4 extremities, Plantars down going.  4. Ears and Eyes appear Normal, Conjunctivae clear, PERRLA. Moist Oral Mucosa.  5. Supple Neck, No JVD, No cervical lymphadenopathy appriciated, No Carotid Bruits.  6. Symmetrical Chest wall movement, Good air movement bilaterally, CTAB.  7.  Irregular, No Gallops, Rubs or Murmurs, No Parasternal Heave.  8. Positive Bowel Sounds, Abdomen Soft, mild generalized nonspecific tenderness to palpation, No organomegaly appriciated,No rebound -guarding or rigidity.  9.  No Cyanosis, Normal Skin Turgor, No Skin Rash or Bruise.  10. Good muscle tone,  joints appear normal , no effusions, Normal ROM.    Data Review:    CBC Recent Labs  Lab 10/27/23 1017 10/28/23 1532  WBC 6.8 6.0  HGB 12.0 12.3  HCT 38.3 38.3  PLT 124* 111*  MCV 92 91.8  MCH 28.9 29.5  MCHC 31.3* 32.1  RDW 13.3 14.9  LYMPHSABS  --  1.2  MONOABS  --  0.4  EOSABS  --  0.2  BASOSABS  --  0.0   ------------------------------------------------------------------------------------------------------------------  Chemistries  Recent Labs  Lab 10/27/23 1017 10/28/23 1532 10/28/23 1635  NA 141 144  --   K 2.8* 2.6*  --   CL 97 100  --   CO2 26 31  --   GLUCOSE 134* 129*  --   BUN 18 20  --   CREATININE 2.01* 2.14*  --   CALCIUM  9.2 9.2  --   MG  --   --  2.3  AST 25 26  --   ALT 13 11  --   ALKPHOS 60 54  --   BILITOT 0.6 0.5  --     ------------------------------------------------------------------------------------------------------------------ estimated creatinine clearance is 19.8 mL/min (A) (by C-G formula based on SCr of 2.14 mg/dL (H)). ------------------------------------------------------------------------------------------------------------------ No results for input(s): TSH, T4TOTAL, T3FREE, THYROIDAB in the last 72 hours.  Invalid input(s): FREET3  Coagulation profile No results for input(s): INR, PROTIME in the last 168 hours. ------------------------------------------------------------------------------------------------------------------- No results for input(s): DDIMER in the last 72 hours. -------------------------------------------------------------------------------------------------------------------  Cardiac Enzymes No results for input(s): CKMB, TROPONINI, MYOGLOBIN in the last 168 hours.  Invalid input(s): CK ------------------------------------------------------------------------------------------------------------------ No results found for: BNP   ---------------------------------------------------------------------------------------------------------------  Urinalysis    Component Value Date/Time   COLORURINE YELLOW 09/01/2023 1811   APPEARANCEUR CLEAR 09/01/2023 1811   APPEARANCEUR Cloudy (A) 05/12/2023 1505   LABSPEC 1.024 09/01/2023 1811   PHURINE 6.0 09/01/2023 1811   GLUCOSEU NEGATIVE 09/01/2023 1811   HGBUR NEGATIVE 09/01/2023 1811   BILIRUBINUR NEGATIVE 09/01/2023 1811   BILIRUBINUR Negative 05/12/2023 1505   KETONESUR NEGATIVE 09/01/2023 1811  PROTEINUR NEGATIVE 09/01/2023 1811   UROBILINOGEN 0.2 05/09/2012 1741   NITRITE NEGATIVE 09/01/2023 1811   LEUKOCYTESUR SMALL (A) 09/01/2023 1811    ---------------------------------------------------------------------------------------------------------------    Imaging Results:    No results  found.  EKG  Vent. rate 81 BPM PR interval * ms QRS duration 115 ms QT/QTcB 384/446 ms P-R-T axes * 199 267 Atrial fibrillation Nonspecific intraventricular conduction delay Low voltage, precordial leads Anteroseptal infarct, old Nonspecific T abnormalities, lateral leads    Assessment & Plan:    Principal Problem:   AKI (acute kidney injury) Active Problems:   Hypothyroidism   H/O gastric bypass   Persistent atrial fibrillation (HCC)   Presence of Watchman left atrial appendage closure device   Stage 3a chronic kidney disease (HCC)   Hyperlipidemia    Severe hypokalemia - Likely due to poor oral intake, return telemetry, replace, magnesium within normal limit, recheck in a.m. - QTc within normal range  AKI in CKD stage III AA - Due to poor oral intake, and diarrhea, - Continue with IV fluids  Failure to thrive, weakness History of gastric bypass - likely due to dehydration, and electrolyte derangement, with underlying gastric bypass with poor oral intake. - Will check urine analysis given she reports dysuria, no evidence of urinary retention, bladder scan showing 170 cc Recent hospitalization with B12, folate, ferritin and iron within normal limit  History of gastric anastomotic ulcer during recent hospitalization - Continue with Protonix  and Carafate  and avoid NSAIDs hemoglobin is stable  Chronic Afib -Not on any anti coagulation given she is status post Watchman procedure -Continue with home diltiazem    Hypothyroidism  - Synthroid  75 mcg PO daily    Dementia  - Aricept  10 mg PO at bedtime    Depression/anxiety  - Effexor  75 mg PO bid   Hypertension - Continue with home medications  Hyperlipidemia  - Continue with home medications  DVT Prophylaxis Heparin    AM Labs Ordered, also please review Full Orders  Family Communication: Admission, patients condition and plan of care including tests being ordered have been discussed with the patient (left  daughter and voicemail) who indicate understanding and agree with the plan and Code Status.  Code Status full code  Likely DC to home  Consults called: None  Admission status: Observation  Time spent in minutes : 70 minutes   Brayton Lye M.D on 10/28/2023 at 7:25 PM   [facility] - Office  [phone]

## 2023-10-28 NOTE — ED Notes (Signed)
 USGPIV attempted without success.

## 2023-10-28 NOTE — Telephone Encounter (Signed)
 Orders given to chris

## 2023-10-28 NOTE — ED Triage Notes (Signed)
 PCP called pt for abnormal labs with low k+ and renal function has decreased. Labs were drawn yesterday.  Pt does have new nausea and weakness. Pt's family wonders if its a UTI. Urine sample was collected at the PCP office yesterday.

## 2023-10-28 NOTE — Telephone Encounter (Signed)
 Copied from CRM [id number]. Topic: Clinical - Home Health Verbal Orders >> Oct 28, 2023 10:53 AM Larissa RAMAN wrote: Caller/Agency: [place]  Callback Number: [phone] Service Requested: Physical Therapy Frequency:  1w x9 Any new concerns about the patient? No

## 2023-10-28 NOTE — ED Provider Notes (Signed)
 [place] EMERGENCY DEPARTMENT AT [facility] Provider Note   CSN: [id number] Arrival date & time: 10/28/23  1443     Patient presents with: abnormal labs (Low K+ and renal function)   MARLIS OLDAKER is a 78 y.o. female.  She had a peptic ulcer with GI bleeding, hypothyroidism, GERD, depression, anemia, A-fib with a Watchman device.  She has had gastric bypass surgery  She presents the ER today for evaluation of abnormal labs outpatient setting.  [name] helps provide history.  They state patient had been admitted for a bleeding ulcer in August/2025 and went to the nursing home, she was discharged a few weeks ago from the nursing home and has been slowly recovering, her daughter states that their initial PCP follow-up had been canceled and rescheduled for yesterday.  She states her mother's progress at home seems to have somewhat stalled.  They did labs because of her continued generalized weakness and called today with critically low potassium and AKI it was advised to come to the ER.  Notes that she did have diarrhea for several days last week.  Her daughter thinks is because she gave her MiraLAX  because she was not having bowel movements and also was told by her home health nurse that the Carafate  she takes could possibly cause diarrhea 2.  This has somewhat improved since stopping the MiraLAX .  Her daughter also states that she does not eat or drink very much at home since coming back from the nursing home which is abnormal for her.  Patient notes that she had a gastric biopsy of past surgery, Roux-en-Y bypass in 2008 he was hide and has had chronic abdominal pain since then.  States her abdominal pain today is unchanged from her baseline.   HPI     Prior to Admission medications   Medication Sig Start Date End Date Taking? Authorizing Provider  acetaminophen  (TYLENOL ) 500 MG tablet Take 1,000 mg by mouth every 6 (six) hours as needed for mild pain (pain score 1-3).    Yes [provider]  alendronate  (FOSAMAX ) 70 MG tablet Take 1 tablet (70 mg total) by mouth every 7 (seven) days. Take with a full glass of water  on an empty stomach. Patient taking differently: Take 70 mg by mouth every Monday. Take with a full glass of water  on an empty stomach. 06/10/23  Yes Del Wilhelmena Falter, [place], FNP  atenolol  (TENORMIN ) 50 MG tablet Take 50 mg by mouth daily. 10/13/23  Yes [provider]  cyanocobalamin (VITAMIN B12) 1000 MCG/ML injection Inject 1,000 mcg into the muscle every 30 (thirty) days.   Yes [provider]  diltiazem  (CARDIZEM  CD) 180 MG 24 hr capsule Take 1 capsule (180 mg total) by mouth daily. Patient taking differently: Take 180 mg by mouth every evening. 09/06/23 10/28/23 Yes Sira, Zackery, MD  donepezil  (ARICEPT ) 10 MG tablet Take 1 tablet (10 mg total) by mouth at bedtime. 08/18/23  Yes Dohmeier, Dedra, MD  levothyroxine  (SYNTHROID , LEVOTHROID) 75 MCG tablet Take 75 mcg by mouth daily before breakfast.   Yes [provider]  lidocaine  (LIDODERM ) 5 % PLACE 1 PATCH ONTO THE SKIN DAILY. REMOVE & DISCARD PATCH WITHIN 12 HOURS OR AS DIRECTED BY MD 08/23/23  Yes Del Wilhelmena Falter, Iliana, FNP  ondansetron  (ZOFRAN ) 4 MG tablet Take 1 tablet (4 mg total) by mouth every 8 (eight) hours as needed for nausea or vomiting. 06/08/23  Yes Rudy Josette RAMAN, PA-C  oxybutynin  (DITROPAN  XL) 15 MG 24  hr tablet Take 1 tablet (15 mg total) by mouth at bedtime. 05/12/23  Yes Del Orbe Polanco, Hilario, FNP  pantoprazole  (PROTONIX ) 40 MG tablet TAKE 1 TABLET BY MOUTH TWICE A DAY 09/07/23  Yes Rudy, Kristen S, PA-C  rosuvastatin  (CRESTOR ) 40 MG tablet Take 1 tablet (40 mg total) by mouth daily. 05/18/23  Yes Del Wilhelmena Falter, Hilario, FNP  sucralfate  (CARAFATE ) 1 g tablet Take 1 tablet (1 g total) by mouth 4 (four) times daily. 09/06/23 10/28/23 Yes Sira, Zackery, MD  venlafaxine  (EFFEXOR ) 75 MG tablet TAKE 1 TABLET BY MOUTH TWICE A DAY 10/06/23  Yes Del  Wilhelmena Falter, [place], FNP  Vitamin D , Ergocalciferol , (DRISDOL ) 1.25 MG (50000 UNIT) CAPS capsule Take 1 capsule (50,000 Units total) by mouth every Monday. 05/16/23  Yes Del Orbe Polanco, Hilario, FNP  polyethylene glycol-electrolytes (NULYTELY ) 420 g solution Take 4,000 mLs by mouth once. 09/28/23   [provider]    Allergies: Benzodiazepines, Nsaids, Statins, Codeine, Escitalopram oxalate, Sertraline  hcl, and Tramadol hcl    Review of Systems  Updated Vital Signs BP (!) 151/85 (BP Location: Right Arm)   Pulse 85   Temp 98.4 F (36.9 C) (Oral)   Resp 20   Ht 5' 5 (1.651 m)   Wt 68 kg   SpO2 95%   BMI 24.95 kg/m   Physical Exam Vitals and nursing note reviewed.  Constitutional:      General: She is not in acute distress.    Appearance: She is well-developed.  HENT:     Head: Normocephalic and atraumatic.     Mouth/Throat:     Mouth: Mucous membranes are dry.  Eyes:     Extraocular Movements: Extraocular movements intact.     Conjunctiva/sclera: Conjunctivae normal.     Pupils: Pupils are equal, round, and reactive to light.  Cardiovascular:     Rate and Rhythm: Normal rate and regular rhythm.     Heart sounds: No murmur heard. Pulmonary:     Effort: Pulmonary effort is normal. No respiratory distress.     Breath sounds: Normal breath sounds.  Abdominal:     Palpations: Abdomen is soft.     Tenderness: There is no abdominal tenderness. There is no guarding or rebound.  Musculoskeletal:        General: No swelling.     Cervical back: Neck supple.     Right lower leg: No edema.     Left lower leg: No edema.  Skin:    General: Skin is warm and dry.     Capillary Refill: Capillary refill takes less than 2 seconds.  Neurological:     General: No focal deficit present.     Mental Status: She is alert and oriented to person, place, and time.  Psychiatric:        Mood and Affect: Mood normal.     (all labs ordered are listed, but only abnormal results are  displayed) Labs Reviewed  CBC WITH DIFFERENTIAL/PLATELET - Abnormal; Notable for the following components:      Result Value   Platelets 111 (*)    All other components within normal limits  COMPREHENSIVE METABOLIC PANEL WITH GFR - Abnormal; Notable for the following components:   Potassium 2.6 (*)    Glucose, Bld 129 (*)    Creatinine, Ser 2.14 (*)    Total Protein 5.9 (*)    GFR, Estimated 23 (*)    All other components within normal limits  MAGNESIUM  URINALYSIS, ROUTINE W REFLEX MICROSCOPIC  EKG: None  Radiology: No results found.   Procedures   Medications Ordered in the ED  magnesium sulfate IVPB 1 g 100 mL (1 g Intravenous New Bag/Given 10/28/23 1819)  potassium chloride  10 mEq in 100 mL IVPB (10 mEq Intravenous New Bag/Given 10/28/23 1821)  potassium chloride  SA (KLOR-CON  M) CR tablet 40 mEq (40 mEq Oral Given 10/28/23 1810)  lactated ringers  bolus 1,000 mL (1,000 mLs Intravenous New Bag/Given 10/28/23 1810)                                    Medical Decision Making This patient presents to the ED for concern of abnormal labs, generalized weakness, decreased p.o. intake, this involves an extensive number of treatment options, and is a complaint that carries with it a high risk of complications and morbidity.  The differential diagnosis includes electrolyte derangement, UTI, AKI, sepsis, other   Co morbidities that complicate the patient evaluation :   A-fib, GI bleeding, GERD, chronic abdominal pain   Additional history obtained:  Additional history obtained from EMR External records from outside source obtained and reviewed including previous labs, notes   Lab Tests:  I Ordered, and personally interpreted labs.  Potassium 2.6 and creatinine 2.14   I   Cardiac Monitoring: / EKG:  The patient was maintained on a cardiac monitor.  I personally viewed and interpreted the cardiac monitored which showed an underlying rhythm of: Atrial  fibrillation   Consultations Obtained:  I requested consultation with the hospitalist for [place],  and discussed lab and imaging findings as well as pertinent plan - they recommend: Admission   Problem List / ED Course / Critical interventions / Medication management  Abnormal labs-patient sent in for AKI and hypokalemia.  Her potassium is 2.6 today, magnesium is normal, cr is 2.14 with baseline of approximately 1.3.  She reports a decreased p.o. intake, clinically she looks dehydrated with dry oral mucosa, feel this is mostly because of her AKI, due to IV fluids, replating her potassium, given a gram of magnesium empirically.  Discussed with hospitalist as above for admission. I ordered medication including IV fluids and IV potassium for dehydration and AKI Reevaluation of the patient after these medicines showed that the patient stayed the same I have reviewed the patients home medicines and have made adjustments as needed   Social Determinants of Health: Patient lives with her daughter    Amount and/or Complexity of Data Reviewed Labs: ordered.  Risk Prescription drug management. Decision regarding hospitalization.        Final diagnoses:  AKI (acute kidney injury)  Hypokalemia    ED Discharge Orders     None          Suellen Sherran DELENA DEVONNA 10/28/23 1856    Francesca Elsie CROME, MD 10/28/23 2143

## 2023-10-29 ENCOUNTER — Other Ambulatory Visit: Payer: Self-pay

## 2023-10-29 ENCOUNTER — Encounter (HOSPITAL_COMMUNITY): Payer: Self-pay | Admitting: Internal Medicine

## 2023-10-29 DIAGNOSIS — Z95818 Presence of other cardiac implants and grafts: Secondary | ICD-10-CM

## 2023-10-29 DIAGNOSIS — E039 Hypothyroidism, unspecified: Secondary | ICD-10-CM | POA: Diagnosis not present

## 2023-10-29 DIAGNOSIS — E876 Hypokalemia: Secondary | ICD-10-CM | POA: Insufficient documentation

## 2023-10-29 DIAGNOSIS — I4819 Other persistent atrial fibrillation: Secondary | ICD-10-CM

## 2023-10-29 DIAGNOSIS — E785 Hyperlipidemia, unspecified: Secondary | ICD-10-CM | POA: Diagnosis not present

## 2023-10-29 DIAGNOSIS — Z9884 Bariatric surgery status: Secondary | ICD-10-CM

## 2023-10-29 DIAGNOSIS — N1831 Chronic kidney disease, stage 3a: Secondary | ICD-10-CM

## 2023-10-29 DIAGNOSIS — N179 Acute kidney failure, unspecified: Secondary | ICD-10-CM | POA: Diagnosis not present

## 2023-10-29 LAB — BASIC METABOLIC PANEL WITH GFR
Anion gap: 9 (ref 5–15)
BUN: 21 mg/dL (ref 8–23)
CO2: 27 mmol/L (ref 22–32)
Calcium: 8.5 mg/dL — ABNORMAL LOW (ref 8.9–10.3)
Chloride: 104 mmol/L (ref 98–111)
Creatinine, Ser: 1.98 mg/dL — ABNORMAL HIGH (ref 0.44–1.00)
GFR, Estimated: 25 mL/min — ABNORMAL LOW (ref >60)
Glucose, Bld: 80 mg/dL (ref 70–99)
Potassium: 3.8 mmol/L (ref 3.5–5.1)
Sodium: 140 mmol/L (ref 135–145)

## 2023-10-29 LAB — CBC
HCT: 32.1 % — ABNORMAL LOW (ref 36.0–46.0)
Hemoglobin: 10.4 g/dL — ABNORMAL LOW (ref 12.0–15.0)
MCH: 29.6 pg (ref 26.0–34.0)
MCHC: 32.4 g/dL (ref 30.0–36.0)
MCV: 91.5 fL (ref 80.0–100.0)
Platelets: 104 K/uL — ABNORMAL LOW (ref 150–400)
RBC: 3.51 MIL/uL — ABNORMAL LOW (ref 3.87–5.11)
RDW: 15 % (ref 11.5–15.5)
WBC: 7.4 K/uL (ref 4.0–10.5)
nRBC: 0 % (ref 0.0–0.2)

## 2023-10-29 LAB — URINE CULTURE

## 2023-10-29 MED ORDER — DICYCLOMINE HCL 10 MG PO CAPS
10.0000 mg | ORAL_CAPSULE | Freq: Three times a day (TID) | ORAL | Status: DC
  Administered 2023-10-30 – 2023-10-31 (×5): 10 mg via ORAL
  Filled 2023-10-29 (×5): qty 1

## 2023-10-29 MED ORDER — FENTANYL CITRATE (PF) 50 MCG/ML IJ SOSY
25.0000 ug | PREFILLED_SYRINGE | INTRAMUSCULAR | Status: DC | PRN
  Administered 2023-10-29 – 2023-10-30 (×3): 25 ug via INTRAVENOUS
  Filled 2023-10-29 (×3): qty 1

## 2023-10-29 NOTE — Progress Notes (Signed)
 Progress Note   Patient: [name] FMW:[id number] DOB: 01/14/1946 DOA: 10/28/2023     0 DOS: the patient was seen and examined on 10/29/2023   Brief hospital course: [name]  is a 78 y.o. female,  with medical history significant for atrial fibrillation status post Watchman device placement no longer anticoagulated, CKD 3A, dementia, depression, and anxiety, with recent hospitalization last August for acute blood loss anemia in the setting of gastrointestinal anastomotic ulcer presented to the emergency department for evaluation of generalized weakness, abnormal labs.  She has creatinine of 2.14, potassium 2.6 called by his PCP for ED evaluation.  Patient is admitted to TRH service for dehydration, acute on chronic kidney injury, hypokalemia.  Assessment and Plan: Severe hypokalemia Due to poor oral intake. Replaced, today K 3.8 from 2.6 on presentation. Continue telemetry. Encourage oral diet, supplements.   AKI in CKD stage III AA Due to poor oral intake, and diarrhea, Continue gentle IV fluids. Monitor daily renal function. Avoid nephrotoxic drugs.   Failure to thrive, weakness History of gastric bypass In the setting of dehydration, electrolyte derangement, with underlying gastric bypass with poor oral intake. Recent hospitalization with B12, folate, ferritin and iron within normal limit. PT/ OT evaluation advised SNF placement.   History of gastric anastomotic ulcer during recent hospitalization Continue with Protonix  and Carafate  and avoid NSAIDs hemoglobin is stable   Chronic Afib Not on any anti coagulation given she is status post Watchman procedure Continue with home diltiazem    Hypothyroidism  Continue Synthroid  75 mcg PO daily    Dementia  Aricept  10 mg PO at bedtime    Depression/anxiety  Resumed Effexor  75 mg PO bid    Hypertension Continue with home medications cardizem , atenolol .   Hyperlipidemia  Continue statin.     Out of bed to chair.  Incentive spirometry. Nursing supportive care. Fall, aspiration precautions. Diet:  Diet Orders (From admission, onward)     Start     Ordered   10/28/23 1939  DIET SOFT Room service appropriate? Yes; Fluid consistency: Thin  Diet effective now       Question Answer Comment  Room service appropriate? Yes   Fluid consistency: Thin      10/28/23 1938           DVT prophylaxis: heparin  injection 5,000 Units Start: 10/29/23 2200  Level of care: Telemetry   Code Status: Full Code  Subjective: Patient is seen and examined today morning. She did complain of diffuse abdominal discomfort. Nausea is better. She is weak, not eating well.  Physical Exam: Vitals:   10/28/23 2006 10/29/23 0357 10/29/23 0953 10/29/23 1239  BP: 133/81 116/83 135/79 127/81  Pulse: 73 77 71 75  Resp: 19 16  (!) 21  Temp: 98.1 F (36.7 C) 98.2 F (36.8 C)  (!) 97.5 F (36.4 C)  TempSrc: Oral Oral  Oral  SpO2: 97% 98%  99%  Weight:      Height:        General - Elderly Caucasian weak female, no apparent distress HEENT - PERRLA, EOMI, atraumatic head, non tender sinuses. Lung - Clear, diffuse rales, no rhonchi, wheezes. Heart - S1, S2 heard, no murmurs, rubs, trace pedal edema. Abdomen - Soft, diffuse tender, non distended, bowel sounds good Neuro - Alert, awake and oriented x 3, non focal exam. Skin - Warm and dry.  Data Reviewed:      Latest Ref Rng & Units 10/29/2023    3:00 AM 10/28/2023    3:32  PM 10/27/2023   10:17 AM  CBC  WBC 4.0 - 10.5 K/uL 7.4  6.0  6.8   Hemoglobin 12.0 - 15.0 g/dL 89.5  87.6  87.9   Hematocrit 36.0 - 46.0 % 32.1  38.3  38.3   Platelets 150 - 400 K/uL 104  111  124       Latest Ref Rng & Units 10/29/2023    3:00 AM 10/28/2023    3:32 PM 10/27/2023   10:17 AM  BMP  Glucose 70 - 99 mg/dL 80  870  865   BUN 8 - 23 mg/dL 21  20  18    Creatinine 0.44 - 1.00 mg/dL 8.01  7.85  7.98   BUN/Creat Ratio 12 - 28   9   Sodium 135 - 145 mmol/L 140  144  141    Potassium 3.5 - 5.1 mmol/L 3.8  2.6  2.8   Chloride 98 - 111 mmol/L 104  100  97   CO2 22 - 32 mmol/L 27  31  26    Calcium  8.9 - 10.3 mg/dL 8.5  9.2  9.2    No results found.  Family Communication: Discussed with patient, understand and agree. All questions answered.  Disposition: Status is: Observation The patient remains OBS appropriate and will d/c before 2 midnights.  Planned Discharge Destination: Skilled nursing facility     Time spent: 45 minutes  Author: Concepcion Riser, MD 10/29/2023 1:20 PM Secure chat 7am to 7pm For on call review [email or web address].

## 2023-10-29 NOTE — Progress Notes (Addendum)
 Has been eating well. C/o nausea but no vomitng.  Stated having abd pain and offered tylenol  and said that won't help.  Contacted Dr. Darci and he ordered fentanyl  and bentyl

## 2023-10-29 NOTE — Progress Notes (Signed)
 Unable to complete admission questions due pt altered mental status.

## 2023-10-29 NOTE — TOC Initial Note (Signed)
 Transition of Care [facility]) - Initial/Assessment Note    Patient Details  Name: [name] MRN: [id number] Date of Birth: 1945-03-05  [facility] CM/SW Contact:    Sharlyne Stabs, RN Phone Number: 10/29/2023, 4:14 PM  Clinical Narrative:        Patient admitted in OBS with Acute kidney injury. PT is recommending SNF. Per MD in report patient is planning to go home. CM at the beside to complete OBS Status. Patient states she lives with her daughter. She is at work at present time. Patient is agreeable to home health PT, but wants to make she it is okay with her daughter. Discharge planning for Tomorrow. IPCM Following.            Expected Discharge Plan: Home w Home Health Services Barriers to Discharge: Continued Medical Work up   Patient Goals and CMS Choice Patient states their goals for this hospitalization and ongoing recovery are:: want to go home.  Expected Discharge Plan and Services      Living arrangements for the past 2 months: Single Family Home         Prior Living Arrangements/Services Living arrangements for the past 2 months: Single Family Home Lives with:: Adult Children             Admission diagnosis:  Hypokalemia [E87.6] AKI (acute kidney injury) [N17.9] Patient Active Problem List   Diagnosis Date Noted   Hypokalemia 10/29/2023   Acute renal failure superimposed on stage 3a chronic kidney disease (HCC) 10/28/2023   Hyperlipidemia 10/27/2023   Acute marginal ulcer 09/02/2023   Stage 3a chronic kidney disease (HCC) 08/21/2023   Moderate cognitive impairment 08/18/2023   Personal history of traumatic brain injury 08/18/2023   Major depressive disorder in partial remission 08/18/2023   Prediabetes 05/12/2023   Presence of Watchman left atrial appendage closure device 12/03/2021   Constipation 08/14/2019   Persistent atrial fibrillation (HCC)    Copper  deficiency 06/17/2015   Hiatal hernia    Iron deficiency anemia    Spondylolisthesis  of lumbar region 01/01/2013   Palpitations 05/08/2012   Hypothyroidism 03/26/2010   H/O gastric bypass 03/26/2010   B12 deficiency 03/19/2009   DEPRESSION/ANXIETY 03/19/2009   Essential hypertension 03/19/2009   Gastroesophageal reflux disease 03/19/2009   Osteoarthritis 03/19/2009   PCP:  Terry Wilhelmena Lloyd Hilario, FNP Pharmacy:   [id number] - [place], [place] - [address] [place] [address] [place] [address] Phone: [phone] Fax: [phone]     Social Drivers of Health (SDOH) Social History: SDOH Screenings   Food Insecurity: No Food Insecurity (09/02/2023)  Housing: Unknown (10/29/2023)  Transportation Needs: No Transportation Needs (09/02/2023)  Utilities: Not At Risk (09/02/2023)  Alcohol Screen: Low Risk  (12/15/2022)  Depression (PHQ2-9): High Risk (10/27/2023)  Financial Resource Strain: Low Risk  (12/15/2022)  Physical Activity: Inactive (12/15/2022)  Social Connections: Moderately Isolated (09/02/2023)  Stress: No Stress Concern Present (12/15/2022)  Tobacco Use: Medium Risk (10/27/2023)  Health Literacy: Adequate Health Literacy (12/15/2022)   SDOH Interventions:     Readmission Risk Interventions    09/06/2023   12:41 PM 09/05/2023    2:36 PM 09/04/2023    4:02 PM  Readmission Risk Prevention Plan  Transportation Screening Complete Complete Complete  PCP or Specialist Appt within 3-5 Days   Complete  HRI or Home Care Consult Complete Complete Complete  Social Work Consult for Recovery Care Planning/Counseling Complete Complete Complete  Palliative Care Screening  Not Applicable Not Applicable Not Applicable  Medication Review (RN Care Manager) Complete Complete Complete

## 2023-10-29 NOTE — Evaluation (Signed)
 Physical Therapy Evaluation Patient Details Name: [name] MRN: [id number] DOB: 08-11-1945 Today's Date: 10/29/2023  History of Present Illness  Myrtice Lowdermilk  is a 78 y.o. female,  with medical history significant for atrial fibrillation status post Watchman device placement no longer anticoagulated, CKD 3A, dementia, depression, and anxiety, with recent hospitalization last August for acute blood loss anemia in the setting of gastrointestinal anastomotic ulcer.  - Patient was sent to ED for abnormal labs, patient was discharged from subacute rehab few weeks ago, lives at home with her daughter, with poor historian, so history was obtained from ED staff as was unable to reach daughter by the phone, after discharge from facility she had some improvement but appears her progression has stalled, they had a PCP follow-up yesterday due to generalized weakness, she had labs done she was called to come to ED for low potassium and AKI, patient has been having diarrhea for several days, daughter reports because she gave her MiraLAX  because of she had significant constipation prior to that.  Been having chronic abdominal pain since her gastric bypass in 2008, and her abdominal pain is unchanged from baseline, patient reports polyuria and dysuria as well  - In ED creatinine elevated at 2.14 from baseline of 1.35, potassium is low at 2.6, no leukocytosis, [facility] hospitalist consulted to admit   Clinical Impression  Patient demonstrates decreased LE strength, abnormal pain rating in abdomen, and impaired balance. Patient also demonstrates need for mod assist with bed mobility and functional transfers. Pt is unwilling to ambulated even alongside of bed due to pain in abdomen. Pt able to give quite a bit of subjective although chart shows history of cognitive impairment. Patient also demonstrates increased time required to sit EOB and with STS transfer, RW and mod assist required. Patient requires education on role of  PT, importance of regaining strength before returning home and PT recommendations. Patient would benefit from skilled acute physical therapy for increased endurance with ambulation, increased LE strength, and balance for improved functional mobility, return to higher level of function with ADLs, and progress towards therapy goals.         If plan is discharge home, recommend the following: A lot of help with bathing/dressing/bathroom;A lot of help with walking and/or transfers;Assistance with feeding;Assistance with cooking/housework;Direct supervision/assist for medications management;Direct supervision/assist for financial management;Help with stairs or ramp for entrance;Supervision due to cognitive status   Can travel by private vehicle        Equipment Recommendations None recommended by PT  Recommendations for Other Services       Functional Status Assessment Patient has had a recent decline in their functional status and demonstrates the ability to make significant improvements in function in a reasonable and predictable amount of time.     Precautions / Restrictions Precautions Precautions: Fall Recall of Precautions/Restrictions: Intact Restrictions Weight Bearing Restrictions Per Provider Order: No      Mobility  Bed Mobility Overal bed mobility: Needs Assistance Bed Mobility: Supine to Sit     Supine to sit: Mod assist     General bed mobility comments: pt requires assist with movement of bed spread and LEs    Transfers Overall transfer level: Needs assistance Equipment used: Rolling walker (2 wheels) Transfers: Sit to/from Stand Sit to Stand: Mod assist                Ambulation/Gait                  Stairs  Wheelchair Mobility     Tilt Bed    Modified Rankin (Stroke Patients Only)       Balance Overall balance assessment: Needs assistance Sitting-balance support: Bilateral upper extremity supported Sitting  balance-Leahy Scale: Fair   Postural control: Posterior lean Standing balance support: Bilateral upper extremity supported Standing balance-Leahy Scale: Fair                               Pertinent Vitals/Pain Pain Assessment Pain Assessment: Faces Faces Pain Scale: Hurts little more Pain Location: stomach Pain Intervention(s): Limited activity within patient's tolerance    Home Living Family/patient expects to be discharged to:: Private residence Living Arrangements: Children Available Help at Discharge: Family;Available PRN/intermittently Type of Home: House Home Access: Stairs to enter Entrance Stairs-Rails: Right Entrance Stairs-Number of Steps: 5-6   Home Layout: One level Home Equipment: Cane - quad;Wheelchair - manual Additional Comments: Pt poor historian due to history of dementia. Family not present to verify.    Prior Function Prior Level of Function : Independent/Modified Independent             Mobility Comments: Per SW note, pt daughter reports pt was pretty much independent. Pt confirms. Says she's needed more help recently. Reports she usually uses Quad cane in home. Does not ambulate much outside of home. ADLs Comments: Reports increased assist needed recently with bathing and dressing     Extremity/Trunk Assessment   Upper Extremity Assessment Upper Extremity Assessment: Overall WFL for tasks assessed;Generalized weakness    Lower Extremity Assessment Lower Extremity Assessment: Generalized weakness    Cervical / Trunk Assessment Cervical / Trunk Assessment: Kyphotic  Communication   Communication Communication: No apparent difficulties    Cognition Arousal: Obtunded Behavior During Therapy: WFL for tasks assessed/performed   PT - Cognitive impairments: History of cognitive impairments, No family/caregiver present to determine baseline, Orientation   Orientation impairments: Place                     Following  commands: Intact       Cueing Cueing Techniques: Verbal cues, Tactile cues, Visual cues     General Comments      Exercises     Assessment/Plan    PT Assessment Patient needs continued PT services  PT Problem List Decreased strength;Decreased mobility;Decreased range of motion;Decreased activity tolerance;Pain;Decreased balance       PT Treatment Interventions DME instruction;Gait training;Stair training;Functional mobility training;Neuromuscular re-education;Balance training;Therapeutic exercise;Therapeutic activities;Patient/family education    PT Goals (Current goals can be found in the Care Plan section)  Acute Rehab PT Goals Patient Stated Goal: pt would like to go home PT Goal Formulation: With patient Time For Goal Achievement: 11/12/23 Potential to Achieve Goals: Fair    Frequency Min 3X/week     Co-evaluation               AM-PAC PT 6 Clicks Mobility  Outcome Measure Help needed turning from your back to your side while in a flat bed without using bedrails?: A Lot Help needed moving from lying on your back to sitting on the side of a flat bed without using bedrails?: A Lot Help needed moving to and from a bed to a chair (including a wheelchair)?: A Lot Help needed standing up from a chair using your arms (e.g., wheelchair or bedside chair)?: A Lot Help needed to walk in hospital room?: Total Help needed climbing 3-5 steps with a railing? :  Total 6 Click Score: 10    End of Session   Activity Tolerance: Patient tolerated treatment well;Patient limited by pain Patient left: in bed;with bed alarm set Nurse Communication: Mobility status;Precautions PT Visit Diagnosis: Unsteadiness on feet (R26.81);Other abnormalities of gait and mobility (R26.89);Muscle weakness (generalized) (M62.81);Difficulty in walking, not elsewhere classified (R26.2);Pain    Time: 9184-9157 PT Time Calculation (min) (ACUTE ONLY): 27 min   Charges:   PT Evaluation $PT Eval  Low Complexity: 1 Low PT Treatments $Therapeutic Activity: 8-22 mins PT General Charges $$ ACUTE PT VISIT: 1 Visit         Lang Ada, PT, DPT [facility] Office: [phone] 9:00 AM, 10/29/23

## 2023-10-29 NOTE — Plan of Care (Signed)
  Problem: Acute Rehab PT Goals(only PT should resolve) Goal: Pt Will Go Supine/Side To Sit Outcome: Progressing Flowsheets (Taken 10/29/2023 0901) Pt will go Supine/Side to Sit: with minimal assist Goal: Patient Will Transfer Sit To/From Stand Outcome: Progressing Flowsheets (Taken 10/29/2023 0901) Patient will transfer sit to/from stand: with minimal assist Goal: Pt Will Transfer Bed To Chair/Chair To Bed Outcome: Progressing Flowsheets (Taken 10/29/2023 0901) Pt will Transfer Bed to Chair/Chair to Bed: with min assist Goal: Pt Will Ambulate Outcome: Progressing Flowsheets (Taken 10/29/2023 0901) Pt will Ambulate:  15 feet  with rolling walker  with moderate assist   Lang Ada, PT, DPT [facility] Office: [phone] 9:02 AM, 10/29/23

## 2023-10-29 NOTE — Care Management Obs Status (Signed)
 MEDICARE OBSERVATION STATUS NOTIFICATION   Patient Details  Name: [name] MRN: [id number] Date of Birth: 01/05/1946   Medicare Observation Status Notification Given:  Yes    Sharlyne Stabs, RN 10/29/2023, 3:13 PM

## 2023-10-30 DIAGNOSIS — N179 Acute kidney failure, unspecified: Secondary | ICD-10-CM | POA: Diagnosis not present

## 2023-10-30 DIAGNOSIS — Z9884 Bariatric surgery status: Secondary | ICD-10-CM | POA: Diagnosis not present

## 2023-10-30 DIAGNOSIS — R300 Dysuria: Secondary | ICD-10-CM | POA: Insufficient documentation

## 2023-10-30 DIAGNOSIS — E876 Hypokalemia: Secondary | ICD-10-CM | POA: Diagnosis not present

## 2023-10-30 DIAGNOSIS — E785 Hyperlipidemia, unspecified: Secondary | ICD-10-CM | POA: Diagnosis not present

## 2023-10-30 LAB — BASIC METABOLIC PANEL WITH GFR
Anion gap: 7 (ref 5–15)
BUN: 23 mg/dL (ref 8–23)
CO2: 27 mmol/L (ref 22–32)
Calcium: 9.1 mg/dL (ref 8.9–10.3)
Chloride: 107 mmol/L (ref 98–111)
Creatinine, Ser: 2 mg/dL — ABNORMAL HIGH (ref 0.44–1.00)
GFR, Estimated: 25 mL/min — ABNORMAL LOW (ref >60)
Glucose, Bld: 92 mg/dL (ref 70–99)
Potassium: 4.1 mmol/L (ref 3.5–5.1)
Sodium: 141 mmol/L (ref 135–145)

## 2023-10-30 LAB — CBC
HCT: 36 % (ref 36.0–46.0)
Hemoglobin: 11.3 g/dL — ABNORMAL LOW (ref 12.0–15.0)
MCH: 29 pg (ref 26.0–34.0)
MCHC: 31.4 g/dL (ref 30.0–36.0)
MCV: 92.5 fL (ref 80.0–100.0)
Platelets: 106 K/uL — ABNORMAL LOW (ref 150–400)
RBC: 3.89 MIL/uL (ref 3.87–5.11)
RDW: 15.2 % (ref 11.5–15.5)
WBC: 7.7 K/uL (ref 4.0–10.5)
nRBC: 0 % (ref 0.0–0.2)

## 2023-10-30 MED ORDER — SODIUM CHLORIDE 0.9 % IV SOLN: INTRAVENOUS | Status: AC

## 2023-10-30 NOTE — Progress Notes (Signed)
 Subjective:  Patient ID: [name], female    DOB: 1945/04/02  Age: 78 y.o. MRN: [id number]  CC:   Chief Complaint  Patient presents with   Urinary Incontinence    Can't not hold her urine   Memory Loss    HPI:  78 year old female with the below mentioned medical problems (patient of [facility]) presents for evaluation of the above.  Ongoing generalized weakness, decreased PO intake, dysuria and urinary incontinence. Symptoms worse of the past 2 weeks.  Daughter provides the history given Dementia. No documented fever.  Daughter also reports that she complains of chronic abdominal pain (this has been going on since gastric bypass).  Patient Active Problem List   Diagnosis Date Noted   Dysuria 10/30/2023   Hypokalemia 10/29/2023   Acute renal failure superimposed on stage 3a chronic kidney disease (HCC) 10/28/2023   Hyperlipidemia 10/27/2023   Acute marginal ulcer 09/02/2023   Stage 3a chronic kidney disease (HCC) 08/21/2023   Moderate cognitive impairment 08/18/2023   Personal history of traumatic brain injury 08/18/2023   Major depressive disorder in partial remission 08/18/2023   Prediabetes 05/12/2023   Presence of Watchman left atrial appendage closure device 12/03/2021   Constipation 08/14/2019   Persistent atrial fibrillation (HCC)    Copper  deficiency 06/17/2015   Hiatal hernia    Iron deficiency anemia    Spondylolisthesis of lumbar region 01/01/2013   Palpitations 05/08/2012   Hypothyroidism 03/26/2010   H/O gastric bypass 03/26/2010   B12 deficiency 03/19/2009   DEPRESSION/ANXIETY 03/19/2009   Essential hypertension 03/19/2009   Gastroesophageal reflux disease 03/19/2009   Osteoarthritis 03/19/2009    Social Hx   Social History   Socioeconomic History   Marital status: Widowed    Spouse name: Not on file   Number of children: 4   Years of education: Not on file   Highest education level: Not on file  Occupational History   Not on  file  Tobacco Use   Smoking status: Former    Current packs/day: 0.00    Average packs/day: 1 pack/day for 30.0 years (30.0 ttl pk-yrs)    Types: Cigarettes    Start date: 01/19/1968    Quit date: 01/18/1998    Years since quitting: 25.7   Smokeless tobacco: Never  Vaping Use   Vaping status: Never Used  Substance and Sexual Activity   Alcohol use: No   Drug use: No   Sexual activity: Not on file    Comment: Hysterectomy  Other Topics Concern   Not on file  Social History Narrative   Not on file   Social Drivers of Health   Financial Resource Strain: Low Risk  (12/15/2022)   Overall Financial Resource Strain (CARDIA)    Difficulty of Paying Living Expenses: Not hard at all  Food Insecurity: No Food Insecurity (09/02/2023)   Hunger Vital Sign    Worried About Running Out of Food in the Last Year: Never true    Ran Out of Food in the Last Year: Never true  Transportation Needs: No Transportation Needs (09/02/2023)   PRAPARE - Administrator, Civil Service (Medical): No    Lack of Transportation (Non-Medical): No  Physical Activity: Inactive (12/15/2022)   Exercise Vital Sign    Days of Exercise per Week: 0 days    Minutes of Exercise per Session: 0 min  Stress: No Stress Concern Present (12/15/2022)   [employer] of Occupational Health - Occupational Stress Questionnaire  Feeling of Stress : Not at all  Social Connections: Moderately Isolated (09/02/2023)   Social Connection and Isolation Panel    Frequency of Communication with Friends and Family: More than three times a week    Frequency of Social Gatherings with Friends and Family: More than three times a week    Attends Religious Services: More than 4 times per year    Active Member of [employer] or Organizations: No    Attends Banker Meetings: Never    Marital Status: Widowed    Review of Systems Per HPI  Objective:  BP 132/85   Pulse 80   Ht 5' 5 (1.651 m)   SpO2 96%   BMI 24.96  kg/m      10/30/2023    3:17 PM 10/30/2023    3:35 AM 10/29/2023    8:06 PM  BP/Weight  Systolic BP 128 134 118  Diastolic BP 78 83 74    Physical Exam Vitals and nursing note reviewed.  Constitutional:      General: She is not in acute distress. HENT:     Head: Normocephalic and atraumatic.  Cardiovascular:     Rate and Rhythm: Normal rate and regular rhythm.  Pulmonary:     Effort: Pulmonary effort is normal.     Breath sounds: Normal breath sounds. No wheezing or rales.  Abdominal:     General: There is no distension.     Palpations: Abdomen is soft.     Tenderness: There is no abdominal tenderness.  Neurological:     Mental Status: She is alert. Mental status is at baseline.     Lab Results  Component Value Date   WBC 7.7 10/30/2023   HGB 11.3 (L) 10/30/2023   HCT 36.0 10/30/2023   PLT 106 (L) 10/30/2023   GLUCOSE 92 10/30/2023   CHOL 210 (H) 05/12/2023   TRIG 125 05/12/2023   HDL 69 05/12/2023   LDLCALC 119 (H) 05/12/2023   ALT 11 10/28/2023   AST 26 10/28/2023   NA 141 10/30/2023   K 4.1 10/30/2023   CL 107 10/30/2023   CREATININE 2.00 (H) 10/30/2023   BUN 23 10/30/2023   CO2 27 10/30/2023   TSH 3.280 05/12/2023   INR 1.08 08/05/2010   HGBA1C 5.6 09/04/2023     Assessment & Plan:  Acute renal failure superimposed on stage 3a chronic kidney disease, unspecified acute renal failure type [facility]) Assessment & Plan: Labs returned with AKI (creatinine 2.1). Associated hypokalemia. Patient/daughter called and she was sent directly to the ER. Needs hospital admission   Stage 3a chronic kidney disease (HCC) -     CMP14+EGFR  Iron deficiency anemia, unspecified iron deficiency anemia type -     CBC -     Iron, TIBC and Ferritin Panel  Dysuria Assessment & Plan: Patient could not provide a urine sample today. Urine cup sent home and urine was brought back. Culture was obtained and was negative.  Orders: -     Urine Culture    Jacqulyn Ahle  DO [facility]

## 2023-10-30 NOTE — Progress Notes (Signed)
 Daughter called and said that she doesn't want patient receiving IV Fentanyl  anymore and she only wants her to have Tylenol  for pain. MD and charge nurse made aware.

## 2023-10-30 NOTE — Progress Notes (Signed)
 Progress Note   Patient: [name] FMW:[id number] DOB: 12/20/1945 DOA: 10/28/2023     0 DOS: the patient was seen and examined on 10/30/2023   Brief hospital course: Eliane Hammersmith  is a 78 y.o. female,  with medical history significant for atrial fibrillation status post Watchman device placement no longer anticoagulated, CKD 3A, dementia, depression, and anxiety, with recent hospitalization last August for acute blood loss anemia in the setting of gastrointestinal anastomotic ulcer presented to the emergency department for evaluation of generalized weakness, abnormal labs.  She has creatinine of 2.14, potassium 2.6 called by his PCP for ED evaluation.  Patient is admitted to TRH service for dehydration, acute on chronic kidney injury, hypokalemia.  Assessment and Plan: Severe hypokalemia Due to poor oral intake, GI losses. K improved with supplementation. Continue telemetry. Encourage oral diet, supplements.   AKI in CKD stage III AA Due to poor oral intake, and diarrhea, Continue gentle IV fluids. Monitor daily renal function. Avoid nephrotoxic drugs.   Failure to thrive, weakness History of gastric bypass In the setting of dehydration, electrolyte derangement, with underlying gastric bypass with poor oral intake. Recent hospitalization with B12, folate, ferritin and iron within normal limit. PT/ OT evaluation advised SNF placement. Daughter would like HH services, refused SNF.   History of gastric anastomotic ulcer during recent hospitalization Continue with Protonix  and Carafate  and avoid NSAIDs hemoglobin is stable   Chronic Afib Not on any anti coagulation given she is status post Watchman procedure Continue with home diltiazem    Hypothyroidism  Continue Synthroid  75 mcg PO daily    Dementia  Aricept  10 mg PO at bedtime    Depression/anxiety  Resumed Effexor  75 mg PO bid    Hypertension Continue with home medications cardizem , atenolol .   Hyperlipidemia  Continue  statin.     Out of bed to chair. Incentive spirometry. Nursing supportive care. Fall, aspiration precautions. Diet:  Diet Orders (From admission, onward)     Start     Ordered   10/28/23 1939  DIET SOFT Room service appropriate? Yes; Fluid consistency: Thin  Diet effective now       Question Answer Comment  Room service appropriate? Yes   Fluid consistency: Thin      10/28/23 1938           DVT prophylaxis: heparin  injection 5,000 Units Start: 10/29/23 2200  Level of care: Telemetry   Code Status: Full Code  Subjective: Patient is seen and examined today morning. She has lower abdominal discomfort. Nausea is better. She is weak, eating poor.  Physical Exam: Vitals:   10/29/23 0953 10/29/23 1239 10/29/23 2006 10/30/23 0335  BP: 135/79 127/81 118/74 134/83  Pulse: 71 75 79 75  Resp:  (!) 21 17 16   Temp:  (!) 97.5 F (36.4 C) 97.6 F (36.4 C) 97.9 F (36.6 C)  TempSrc:  Oral    SpO2:  99% 96% 97%  Weight:      Height:        General - Elderly Caucasian weak female, no apparent distress HEENT - PERRLA, EOMI, atraumatic head, non tender sinuses. Lung - Clear, diffuse rales, no rhonchi, wheezes. Heart - S1, S2 heard, no murmurs, rubs, trace pedal edema. Abdomen - Soft, diffuse tender, non distended, bowel sounds good Neuro - Alert, awake and oriented x 3, non focal exam. Skin - Warm and dry.  Data Reviewed:      Latest Ref Rng & Units 10/30/2023    8:24 AM 10/29/2023  3:00 AM 10/28/2023    3:32 PM  CBC  WBC 4.0 - 10.5 K/uL 7.7  7.4  6.0   Hemoglobin 12.0 - 15.0 g/dL 88.6  89.5  87.6   Hematocrit 36.0 - 46.0 % 36.0  32.1  38.3   Platelets 150 - 400 K/uL 106  104  111       Latest Ref Rng & Units 10/30/2023    8:24 AM 10/29/2023    3:00 AM 10/28/2023    3:32 PM  BMP  Glucose 70 - 99 mg/dL 92  80  870   BUN 8 - 23 mg/dL 23  21  20    Creatinine 0.44 - 1.00 mg/dL 7.99  8.01  7.85   Sodium 135 - 145 mmol/L 141  140  144   Potassium 3.5 - 5.1 mmol/L  4.1  3.8  2.6   Chloride 98 - 111 mmol/L 107  104  100   CO2 22 - 32 mmol/L 27  27  31    Calcium  8.9 - 10.3 mg/dL 9.1  8.5  9.2    No results found.  Family Communication: Discussed with patient, understand and agree. All questions answered.  Disposition: Status is: Observation The patient remains OBS appropriate and will d/c before 2 midnights.  Planned Discharge Destination: Skilled nursing facility     Time spent: 43 minutes  Author: Concepcion Riser, MD 10/30/2023 12:34 PM Secure chat 7am to 7pm For on call review [email or web address].

## 2023-10-30 NOTE — TOC Progression Note (Signed)
 Transition of Care [facility]) - Progression Note    Patient Details  Name: [name] MRN: [id number] Date of Birth: [birth year]  [facility] of [facility]) CM/SW Contact  Lucie Lunger, [place] Phone Number: 10/30/2023, 1:30 PM  Clinical Narrative:    CSW spoke with pts daughter who states that pt lives with her since she returned home from SNF at [facility]. Pts daughter states she prefers for pt to return home with [facility] but would like for pt to be getting out of bed. CSW updated RN and MD of requests for pt to get out of bed. Pt has HH with [facility], TOC will follow to see if [facility] PT/OT/Aide services can be arranged for pt. TOC to follow.   Expected Discharge Plan: Home w Home Health Services Barriers to Discharge: Continued Medical Work up               Expected Discharge Plan and Services       Living arrangements for the past 2 months: Single Family Home                                       Social Drivers of Health (SDOH) Interventions SDOH Screenings   Food Insecurity: No Food Insecurity (09/02/2023)  Housing: Unknown (10/29/2023)  Transportation Needs: No Transportation Needs (09/02/2023)  Utilities: Not At Risk (09/02/2023)  Alcohol Screen: Low Risk  (12/15/2022)  Depression (PHQ2-9): High Risk (10/27/2023)  Financial Resource Strain: Low Risk  (12/15/2022)  Physical Activity: Inactive (12/15/2022)  Social Connections: Moderately Isolated (09/02/2023)  Stress: No Stress Concern Present (12/15/2022)  Tobacco Use: Medium Risk (10/29/2023)  Health Literacy: Adequate Health Literacy (12/15/2022)    Readmission Risk Interventions    09/06/2023   12:41 PM 09/05/2023    2:36 PM 09/04/2023    4:02 PM  Readmission Risk Prevention Plan  Transportation Screening Complete Complete Complete  PCP or Specialist Appt within 3-5 Days   Complete  HRI or Home Care Consult Complete Complete Complete  Social Work Consult for Recovery Care Planning/Counseling Complete Complete Complete   Palliative Care Screening Not Applicable Not Applicable Not Applicable  Medication Review Oceanographer) Complete Complete Complete

## 2023-10-30 NOTE — Assessment & Plan Note (Signed)
 Patient could not provide a urine sample today. Urine cup sent home and urine was brought back. Culture was obtained and was negative.

## 2023-10-30 NOTE — Progress Notes (Signed)
 NT and myself have encouraged and asked if we can get her out of bed and into the chair. Patient is refusing. I explained the benefits of getting out of bed and she states I don't feel like it. She denies pain. She said she may try getting up this evening for dinner. Called to inform daughter, no answer.

## 2023-10-30 NOTE — Progress Notes (Signed)
 Encouraged and attempted to get patient out of bed again and she refused.

## 2023-10-30 NOTE — Assessment & Plan Note (Signed)
 Labs returned with AKI (creatinine 2.1). Associated hypokalemia. Patient/daughter called and she was sent directly to the ER. Needs hospital admission

## 2023-10-30 NOTE — Progress Notes (Incomplete)
 Progress Note   Patient: [name] FMW:[id number] DOB: 1945/05/26 DOA: 10/28/2023     0 DOS: the patient was seen and examined on 10/30/2023   Brief hospital course: [name]  is a 78 y.o. female,  with medical history significant for atrial fibrillation status post Watchman device placement no longer anticoagulated, CKD 3A, dementia, depression, and anxiety, with recent hospitalization last August for acute blood loss anemia in the setting of gastrointestinal anastomotic ulcer presented to the emergency department for evaluation of generalized weakness, abnormal labs.  She has creatinine of 2.14, potassium 2.6 called by his PCP for ED evaluation.  Patient is admitted to TRH service for dehydration, acute on chronic kidney injury, hypokalemia.  Assessment and Plan: Severe hypokalemia Due to poor oral intake. Replaced, today K 3.8 from 2.6 on presentation. Continue telemetry. Encourage oral diet, supplements.   AKI in CKD stage III AA Due to poor oral intake, and diarrhea, Continue gentle IV fluids. Monitor daily renal function. Avoid nephrotoxic drugs.   Failure to thrive, weakness History of gastric bypass In the setting of dehydration, electrolyte derangement, with underlying gastric bypass with poor oral intake. Recent hospitalization with B12, folate, ferritin and iron within normal limit. PT/ OT evaluation advised SNF placement.   History of gastric anastomotic ulcer during recent hospitalization Continue with Protonix  and Carafate  and avoid NSAIDs hemoglobin is stable   Chronic Afib Not on any anti coagulation given she is status post Watchman procedure Continue with home diltiazem    Hypothyroidism  Continue Synthroid  75 mcg PO daily    Dementia  Aricept  10 mg PO at bedtime    Depression/anxiety  Resumed Effexor  75 mg PO bid    Hypertension Continue with home medications cardizem , atenolol .   Hyperlipidemia  Continue statin.  {Tip this will not be part of  the note when signed Body mass index is 24.95 kg/m. , ,  (Optional):26781}   Out of bed to chair. Incentive spirometry. Nursing supportive care. Fall, aspiration precautions. Diet:  Diet Orders (From admission, onward)     Start     Ordered   10/28/23 1939  DIET SOFT Room service appropriate? Yes; Fluid consistency: Thin  Diet effective now       Question Answer Comment  Room service appropriate? Yes   Fluid consistency: Thin      10/28/23 1938           DVT prophylaxis: heparin  injection 5,000 Units Start: 10/29/23 2200  Level of care: Telemetry   Code Status: Full Code  Subjective: Patient is seen and examined today morning. She did complain of diffuse abdominal discomfort. Nausea is better. She is weak, not eating well.  Physical Exam: Vitals:   10/29/23 0953 10/29/23 1239 10/29/23 2006 10/30/23 0335  BP: 135/79 127/81 118/74 134/83  Pulse: 71 75 79 75  Resp:  (!) 21 17 16   Temp:  (!) 97.5 F (36.4 C) 97.6 F (36.4 C) 97.9 F (36.6 C)  TempSrc:  Oral    SpO2:  99% 96% 97%  Weight:      Height:        General - Elderly Caucasian weak female, no apparent distress HEENT - PERRLA, EOMI, atraumatic head, non tender sinuses. Lung - Clear, diffuse rales, no rhonchi, wheezes. Heart - S1, S2 heard, no murmurs, rubs, trace pedal edema. Abdomen - Soft, diffuse tender, non distended, bowel sounds good Neuro - Alert, awake and oriented x 3, non focal exam. Skin - Warm and dry.  Data Reviewed:  Latest Ref Rng & Units 10/30/2023    8:24 AM 10/29/2023    3:00 AM 10/28/2023    3:32 PM  CBC  WBC 4.0 - 10.5 K/uL 7.7  7.4  6.0   Hemoglobin 12.0 - 15.0 g/dL 88.6  89.5  87.6   Hematocrit 36.0 - 46.0 % 36.0  32.1  38.3   Platelets 150 - 400 K/uL 106  104  111       Latest Ref Rng & Units 10/30/2023    8:24 AM 10/29/2023    3:00 AM 10/28/2023    3:32 PM  BMP  Glucose 70 - 99 mg/dL 92  80  870   BUN 8 - 23 mg/dL 23  21  20    Creatinine 0.44 - 1.00 mg/dL 7.99   8.01  7.85   Sodium 135 - 145 mmol/L 141  140  144   Potassium 3.5 - 5.1 mmol/L 4.1  3.8  2.6   Chloride 98 - 111 mmol/L 107  104  100   CO2 22 - 32 mmol/L 27  27  31    Calcium  8.9 - 10.3 mg/dL 9.1  8.5  9.2    No results found.  Family Communication: Discussed with patient, understand and agree. All questions answered.  Disposition: Status is: Observation The patient remains OBS appropriate and will d/c before 2 midnights.  Planned Discharge Destination: Skilled nursing facility  {Tip this will not be part of the note when signed  DVT Prophylaxis  ., Heparin  injection 5,000 units  (Optional):26781}   Time spent: 45 minutes  Author: Concepcion Riser, MD 10/30/2023 12:07 PM Secure chat 7am to 7pm For on call review [email or web address].

## 2023-10-30 NOTE — Plan of Care (Signed)
  Problem: Education: Goal: Knowledge of General Education information will improve Description: Including pain rating scale, medication(s)/side effects and non-pharmacologic comfort measures Outcome: Progressing   Problem: Clinical Measurements: Goal: Ability to maintain clinical measurements within normal limits will improve Outcome: Progressing   Problem: Activity: Goal: Risk for activity intolerance will decrease Outcome: Progressing   Problem: Pain Managment: Goal: General experience of comfort will improve and/or be controlled Outcome: Progressing   Problem: Safety: Goal: Ability to remain free from injury will improve Outcome: Progressing   Problem: Skin Integrity: Goal: Risk for impaired skin integrity will decrease Outcome: Progressing

## 2023-10-31 DIAGNOSIS — I4819 Other persistent atrial fibrillation: Secondary | ICD-10-CM | POA: Diagnosis not present

## 2023-10-31 DIAGNOSIS — N1831 Chronic kidney disease, stage 3a: Secondary | ICD-10-CM | POA: Diagnosis not present

## 2023-10-31 DIAGNOSIS — Z95818 Presence of other cardiac implants and grafts: Secondary | ICD-10-CM | POA: Diagnosis not present

## 2023-10-31 DIAGNOSIS — Z9884 Bariatric surgery status: Secondary | ICD-10-CM | POA: Diagnosis not present

## 2023-10-31 DIAGNOSIS — E039 Hypothyroidism, unspecified: Secondary | ICD-10-CM | POA: Diagnosis not present

## 2023-10-31 DIAGNOSIS — E876 Hypokalemia: Secondary | ICD-10-CM | POA: Diagnosis not present

## 2023-10-31 DIAGNOSIS — N179 Acute kidney failure, unspecified: Secondary | ICD-10-CM | POA: Diagnosis not present

## 2023-10-31 LAB — CBC
HCT: 38.6 % (ref 36.0–46.0)
Hemoglobin: 12.2 g/dL (ref 12.0–15.0)
MCH: 29.2 pg (ref 26.0–34.0)
MCHC: 31.6 g/dL (ref 30.0–36.0)
MCV: 92.3 fL (ref 80.0–100.0)
Platelets: 110 K/uL — ABNORMAL LOW (ref 150–400)
RBC: 4.18 MIL/uL (ref 3.87–5.11)
RDW: 15.1 % (ref 11.5–15.5)
WBC: 7.1 K/uL (ref 4.0–10.5)
nRBC: 0 % (ref 0.0–0.2)

## 2023-10-31 LAB — BASIC METABOLIC PANEL WITH GFR
Anion gap: 11 (ref 5–15)
BUN: 21 mg/dL (ref 8–23)
CO2: 24 mmol/L (ref 22–32)
Calcium: 8.9 mg/dL (ref 8.9–10.3)
Chloride: 107 mmol/L (ref 98–111)
Creatinine, Ser: 2.02 mg/dL — ABNORMAL HIGH (ref 0.44–1.00)
GFR, Estimated: 25 mL/min — ABNORMAL LOW (ref >60)
Glucose, Bld: 104 mg/dL — ABNORMAL HIGH (ref 70–99)
Potassium: 3.7 mmol/L (ref 3.5–5.1)
Sodium: 142 mmol/L (ref 135–145)

## 2023-10-31 MED ORDER — POTASSIUM CHLORIDE CRYS ER 20 MEQ PO TBCR: 20.0000 meq | EXTENDED_RELEASE_TABLET | Freq: Every day | ORAL | 0 refills | Status: DC

## 2023-10-31 MED ORDER — CYANOCOBALAMIN 1000 MCG/ML IJ SOLN
1000.0000 ug | Freq: Once | INTRAMUSCULAR | Status: AC
  Administered 2023-10-31: 1000 ug via INTRAMUSCULAR
  Filled 2023-10-31: qty 1

## 2023-10-31 NOTE — TOC Transition Note (Addendum)
 Transition of Care [facility]) - Discharge Note   Patient Details  Name: [name] MRN: [id number] Date of Birth: 18-Jan-1946  Transition of [facility] CM/SW Contact:  Noreen KATHEE Cleotilde ISRAEL Phone Number: 10/31/2023, 10:57 AM   Clinical Narrative:     CSW reviewed note in chart regarding daughter wanting to take patient home instead of SNF for short-term rehab. CSW reached out to preferred [facility] agency, which is [place]. Shelia with [facility] shared that they can accept and start of care will be 10/17, but they can only offer PT/OT services. Attempted to reach daughter and had to leave a confidential VM for a return call back, to discuss [facility] services.   Addendum 11:00 AM   Daughter returned CSW called back and shared her experienced with [facility] after patient was DC from [facility]. Daughter asked if CSW had any resources for home aides that she can pay privately for a couple of hours. CSW gathered a few agency brochure and informed daughter that they will be in patient room for her whenever she is able to come. Daughter also asked about a shower chair / bench and bedside table. CSW reached out to [employer] about equipment to see if insurance will cover. Zach with [employer] updated what insurance will cover and information was provided to daughter. CSW was also able to get patient a different HH Agency [facility]) to get the disciplines she requested, PT/OT/RN. Darleene accepted referral and asked MD to place orders.   Addendum 3:36 pm  CSW spoke with daughter again regarding HH, home care services , and palliative care due to daughter concerns. CSW did speak with patient at bedside regarding HH, home care services, and daughter concerns. Patient stated that she loves being home and does not want to be in a nursing home, but that she hurts a lot and currently in pain. When CSW asked patient about her eating patient stated that she does not have an appetite sometimes.  CSW did discuss long-term care in a facility if her  daughter is unable to care for her and the importance of trying to work with therapy and eat to build up her strength. Patient expressed understanding. Palliative referral was submitted for them to follow up with daughter in community to see if this is something that will be helpful for them in the home. Patient stated she did not have any questions when CSW asked.  CSW signing off.   Final next level of care: Home w Home Health Services Barriers to Discharge: Continued Medical Work up   Patient Goals and CMS Choice Patient states their goals for this hospitalization and ongoing recovery are:: return back home CMS Medicare.gov Compare Post Acute Care list provided to:: Patient Represenative (must comment) ([name]) Choice offered to / list presented to : Adult Children      Discharge Placement              Patient chooses bed at:  (Home) Patient to be transferred to facility by: Patient is returning home- daughter or stay will arrange transportation Name of family member notified: [name] Patient and family notified of of transfer: 10/31/23  Discharge Plan and Services Additional resources added to the After Visit Summary for                            [facility] Arranged: PT, OT [facility] Agency: [facility] Date [facility] Agency Contacted: 10/31/23 Time HH Agency Contacted: 1056 Representative spoke  with at [facility] Agency: [facility]  Social Drivers of Health (SDOH) Interventions SDOH Screenings   Food Insecurity: No Food Insecurity (09/02/2023)  Housing: Unknown (10/29/2023)  Transportation Needs: No Transportation Needs (09/02/2023)  Utilities: Not At Risk (09/02/2023)  Alcohol Screen: Low Risk  (12/15/2022)  Depression (PHQ2-9): High Risk (10/27/2023)  Financial Resource Strain: Low Risk  (12/15/2022)  Physical Activity: Inactive (12/15/2022)  Social Connections: Moderately Isolated (09/02/2023)  Stress: No Stress Concern Present (12/15/2022)  Tobacco Use: Medium Risk (10/29/2023)  Health  Literacy: Adequate Health Literacy (12/15/2022)     Readmission Risk Interventions    09/06/2023   12:41 PM 09/05/2023    2:36 PM 09/04/2023    4:02 PM  Readmission Risk Prevention Plan  Transportation Screening Complete Complete Complete  PCP or Specialist Appt within 3-5 Days   Complete  HRI or Home Care Consult Complete Complete Complete  Social Work Consult for Recovery Care Planning/Counseling Complete Complete Complete  Palliative Care Screening Not Applicable Not Applicable Not Applicable  Medication Review Oceanographer) Complete Complete Complete

## 2023-10-31 NOTE — Progress Notes (Signed)
 Mobility Specialist Progress Note:    10/31/23 1305  Mobility  Activity Ambulated with assistance  Level of Assistance Contact guard assist, steadying assist  Assistive Device Front wheel walker  Distance Ambulated (ft) 8 ft  Range of Motion/Exercises Active;All extremities  Activity Response Tolerated well  Mobility Referral Yes  Mobility visit 1 Mobility  Mobility Specialist Start Time (ACUTE ONLY) 1305  Mobility Specialist Stop Time (ACUTE ONLY) 1320  Mobility Specialist Time Calculation (min) (ACUTE ONLY) 15 min   Pt received in bed, RN requesting to ambulate. Required CGA to stand and ambulate with RW. Tolerated well, c/o abdominal pain. Left in chair, NT in room. All needs met.   [name] Mobility Specialist Please contact via Special educational needs teacher or  Rehab office at [phone]

## 2023-10-31 NOTE — Discharge Summary (Signed)
 Physician Discharge Summary   Patient: [name] MRN: [id number] DOB: 1945/10/15  Admit date:     10/28/2023  Discharge date: 10/31/23  Discharge Physician: Concepcion Riser   PCP: Terry Wilhelmena Lloyd Hilario, FNP   Recommendations at discharge:    PCP follow up in 1 week. Surgery follow up post gastric bypass. Monitor renal function, electrolytes as outpatient.  Discharge Diagnoses: Principal Problem:   Acute renal failure superimposed on stage 3a chronic kidney disease (HCC) Active Problems:   Hypothyroidism   H/O gastric bypass   Persistent atrial fibrillation (HCC)   Presence of Watchman left atrial appendage closure device   Stage 3a chronic kidney disease (HCC)   Hyperlipidemia   Hypokalemia  Resolved Problems:   * No resolved hospital problems. *  Hospital Course: [name]  is a 78 y.o. female,  with medical history significant for atrial fibrillation status post Watchman device placement no longer anticoagulated, CKD 3A, dementia, depression, and anxiety, with recent hospitalization last August for acute blood loss anemia in the setting of gastrointestinal anastomotic ulcer presented to the emergency department for evaluation of generalized weakness, abnormal labs.  She has creatinine of 2.14, potassium 2.6 called by his PCP for ED evaluation.   Patient is admitted to TRH service for dehydration, acute on chronic kidney injury, hypokalemia.   Assessment and Plan: Severe hypokalemia Due to poor oral intake, GI losses. K improved with supplementation. Continue telemetry. Encourage oral diet, supplements.   AKI in CKD stage III AA Due to poor oral intake, and diarrhea, She got gentle IV fluids with no significant improvement in kidney function. I discussed with daughter to follow PCP to monitor renal function in 1 week. Encourage oral diet, supplements. Advised to avoid NSAIDs, low BP, contrast studies. She may need nephrology evaluation as outpatient.    Failure to thrive, weakness History of gastric bypass In the setting of dehydration, electrolyte derangement, with underlying gastric bypass with poor oral intake. Recent hospitalization with B12, folate, ferritin and iron within normal limit. PT/ OT evaluation advised SNF placement. Daughter would like HH services, refused SNF.   History of gastric anastomotic ulcer during recent hospitalization Continue with Protonix  and Carafate  and avoid NSAIDs hemoglobin is stable   Chronic Afib Not on any anti coagulation given she is status post Watchman procedure Continue with home diltiazem    Hypothyroidism  Continue Synthroid  75 mcg PO daily    Dementia  Aricept  10 mg PO at bedtime    Depression/anxiety  Resumed Effexor  75 mg PO bid    Hypertension Continue with home medications cardizem , atenolol .   Hyperlipidemia  Continue statin.        Consultants: none Procedures performed: none  Disposition: Home health Diet recommendation:  Discharge Diet Orders (From admission, onward)     Start     Ordered   10/31/23 0000  Diet - low sodium heart healthy        10/31/23 1210           Cardiac diet DISCHARGE MEDICATION: Allergies as of 10/31/2023       Reactions   Benzodiazepines Other (See Comments)   Cloudy thinking, memory loss, withdrawal symptoms when trying to stop without any other medication for detox.    Nsaids Other (See Comments)   Bleeding ulcer    Statins Nausea And Vomiting   Codeine Nausea Only   Escitalopram Oxalate Nausea Only   Sertraline  Hcl Nausea Only   Tramadol Hcl Other (See Comments)   Daughter doesn't want  mother to take due to the fact that the medication made the daughter have a seizure         Medication List     TAKE these medications    acetaminophen  500 MG tablet Commonly known as: TYLENOL  Take 1,000 mg by mouth every 6 (six) hours as needed for mild pain (pain score 1-3).   alendronate  70 MG tablet Commonly known as:  FOSAMAX  Take 1 tablet (70 mg total) by mouth every 7 (seven) days. Take with a full glass of water  on an empty stomach. What changed: when to take this   atenolol  50 MG tablet Commonly known as: TENORMIN  Take 50 mg by mouth daily.   cyanocobalamin 1000 MCG/ML injection Commonly known as: VITAMIN B12 Inject 1,000 mcg into the muscle every 30 (thirty) days.   diltiazem  180 MG 24 hr capsule Commonly known as: Cardizem  CD Take 1 capsule (180 mg total) by mouth daily. What changed: when to take this   donepezil  10 MG tablet Commonly known as: ARICEPT  Take 1 tablet (10 mg total) by mouth at bedtime.   levothyroxine  75 MCG tablet Commonly known as: SYNTHROID  Take 75 mcg by mouth daily before breakfast.   lidocaine  5 % Commonly known as: LIDODERM  PLACE 1 PATCH ONTO THE SKIN DAILY. REMOVE & DISCARD PATCH WITHIN 12 HOURS OR AS DIRECTED BY MD   ondansetron  4 MG tablet Commonly known as: ZOFRAN  Take 1 tablet (4 mg total) by mouth every 8 (eight) hours as needed for nausea or vomiting.   oxybutynin  15 MG 24 hr tablet Commonly known as: DITROPAN  XL Take 1 tablet (15 mg total) by mouth at bedtime.   pantoprazole  40 MG tablet Commonly known as: PROTONIX  TAKE 1 TABLET BY MOUTH TWICE A DAY   polyethylene glycol-electrolytes 420 g solution Commonly known as: NuLYTELY  Take 4,000 mLs by mouth once.   potassium chloride  SA 20 MEQ tablet Commonly known as: KLOR-CON  M Take 1 tablet (20 mEq total) by mouth daily.   rosuvastatin  40 MG tablet Commonly known as: CRESTOR  Take 1 tablet (40 mg total) by mouth daily.   sucralfate  1 g tablet Commonly known as: Carafate  Take 1 tablet (1 g total) by mouth 4 (four) times daily.   venlafaxine  75 MG tablet Commonly known as: EFFEXOR  TAKE 1 TABLET BY MOUTH TWICE A DAY   Vitamin D  (Ergocalciferol ) 1.25 MG (50000 UNIT) Caps capsule Commonly known as: DRISDOL  Take 1 capsule (50,000 Units total) by mouth every Monday.        Follow-up  Information     [facility] Call.   Why: You can call to see if they may have a shower bench/ chair and Bedside table. Contact information: Address: [address] [place] [address]  Phone: [phone]  Open : Friday 10am-3pm and Saturday 10am-4pm        [facility] Follow up.   Why: HH will call to start services for PT/OT/Aide               Discharge Exam: Filed Weights   10/28/23 1505  Weight: 68 kg   General - Elderly Caucasian weak female, no apparent distress HEENT - PERRLA, EOMI, atraumatic head, non tender sinuses. Lung - Clear, diffuse rales, no rhonchi, wheezes. Heart - S1, S2 heard, no murmurs, rubs, trace pedal edema. Abdomen - Soft, diffuse tender, non distended, bowel sounds good Neuro - Alert, awake and oriented x 3, non focal exam. Skin - Warm and dry.  Condition at discharge: stable  The results of significant diagnostics from this hospitalization (including imaging, microbiology, ancillary and laboratory) are listed below for reference.   Imaging Studies: No results found.  Microbiology: Results for orders placed or performed in visit on 10/27/23  Urine Culture     Status: None   Collection Time: 10/27/23  1:00 PM   Specimen: Urine   UR  Result Value Ref Range Status   Urine Culture, Routine Final report  Final   Organism ID, Bacteria Comment  Final    Comment: Mixed urogenital flora 25,000-50,000 colony forming units per mL     Labs: CBC: Recent Labs  Lab 10/27/23 1017 10/28/23 1532 10/29/23 0300 10/30/23 0824 10/31/23 0341  WBC 6.8 6.0 7.4 7.7 7.1  NEUTROABS  --  4.1  --   --   --   HGB 12.0 12.3 10.4* 11.3* 12.2  HCT 38.3 38.3 32.1* 36.0 38.6  MCV 92 91.8 91.5 92.5 92.3  PLT 124* 111* 104* 106* 110*   Basic Metabolic Panel: Recent Labs  Lab 10/27/23 1017 10/28/23 1532 10/28/23 1635 10/29/23 0300 10/30/23 0824 10/31/23 0341  NA 141 144  --  140 141 142  K 2.8* 2.6*  --  3.8 4.1 3.7  CL 97 100  --  104  107 107  CO2 26 31  --  27 27 24   GLUCOSE 134* 129*  --  80 92 104*  BUN 18 20  --  21 23 21   CREATININE 2.01* 2.14*  --  1.98* 2.00* 2.02*  CALCIUM  9.2 9.2  --  8.5* 9.1 8.9  MG  --   --  2.3  --   --   --   PHOS  --   --  3.2  --   --   --    Liver Function Tests: Recent Labs  Lab 10/27/23 1017 10/28/23 1532  AST 25 26  ALT 13 11  ALKPHOS 60 54  BILITOT 0.6 0.5  PROT 5.9* 5.9*  ALBUMIN 3.9 3.7   CBG: No results for input(s): GLUCAP in the last 168 hours.  Discharge time spent: 36 minutes.  Signed: Concepcion Riser, MD [facility] 10/31/2023

## 2023-10-31 NOTE — Progress Notes (Signed)
 Spoke with daughter to let her know that she will be discharged home today. Daughter will pick up today at 4:00pm

## 2023-10-31 NOTE — Plan of Care (Signed)
   Problem: Education: Goal: Knowledge of General Education information will improve Description: Including pain rating scale, medication(s)/side effects and non-pharmacologic comfort measures Outcome: Progressing   Problem: Activity: Goal: Risk for activity intolerance will decrease Outcome: Progressing   Problem: Nutrition: Goal: Adequate nutrition will be maintained Outcome: Progressing

## 2023-11-01 ENCOUNTER — Telehealth: Payer: Self-pay

## 2023-11-01 ENCOUNTER — Telehealth: Payer: Self-pay | Admitting: *Deleted

## 2023-11-01 NOTE — Transitions of Care (Post Inpatient/ED Visit) (Signed)
 11/01/2023  Name: [name] MRN: [id number] DOB: September 22, 1945  Today's TOC FU Call Status: Today's TOC FU Call Status:: Successful TOC FU Call Completed TOC FU Call Complete Date: 11/01/23 Patient's Name and Date of Birth confirmed.  Transition Care Management Follow-up Telephone Call Date of Discharge: 10/31/23 [facility] Type of Discharge: Inpatient Admission Primary Inpatient Discharge Diagnosis:: Acute Renal Failure How have you been since you were released from the hospital?: Better Any questions or concerns?: Yes Patient Questions/Concerns:: (S) Concerned about patient's weakness and ablilty to come for an appoitment and complete upcoming colonoscopy Patient Questions/Concerns Addressed: Notified Provider of Patient Questions/Concerns  Items Reviewed: Did you receive and understand the discharge instructions provided?: Yes Medications obtained,verified, and reconciled?: Yes (Medications Reviewed) Any new allergies since your discharge?: No Dietary orders reviewed?: NA Do you have support at home?: Yes People in Home [RPT]: child(ren), adult  Medications Reviewed Today: Medications Reviewed Today     Reviewed by Lavelle Charmaine NOVAK, LPN (Licensed Practical Nurse) on 11/01/23 at 1110  Med List Status: <None>   Medication Order Taking? Sig Documenting Provider Last Dose Status Informant  acetaminophen  (TYLENOL ) 500 MG tablet [id number] Yes Take 1,000 mg by mouth every 6 (six) hours as needed for mild pain (pain score 1-3). [provider]  Active Child, Pharmacy Records  alendronate  (FOSAMAX ) 70 MG tablet [id number] Yes Take 1 tablet (70 mg total) by mouth every 7 (seven) days. Take with a full glass of water  on an empty stomach.  Patient taking differently: Take 70 mg by mouth every Monday. Take with a full glass of water  on an empty stomach.   Del Wilhelmena Falter, Hilario, FNP  Active Child, Pharmacy Records  atenolol  (TENORMIN ) 50 MG tablet  [id number] Yes Take 50 mg by mouth daily. [provider]  Active Child, Pharmacy Records  cyanocobalamin (VITAMIN B12) 1000 MCG/ML injection [id number] Yes Inject 1,000 mcg into the muscle every 30 (thirty) days. [provider]  Active Child, Pharmacy Records           Med Note (WARD, CHUCK MATSU   Fri Oct 28, 2023  5:51 PM) Pt needs a dose soon  diltiazem  (CARDIZEM  CD) 180 MG 24 hr capsule [id number] Yes Take 1 capsule (180 mg total) by mouth daily.  Patient taking differently: Take 180 mg by mouth every evening.   Sira, Zackery, MD  Active Child, Pharmacy Records  donepezil  (ARICEPT ) 10 MG tablet [id number] Yes Take 1 tablet (10 mg total) by mouth at bedtime. Dohmeier, Dedra, MD  Active Child, Pharmacy Records  levothyroxine  (SYNTHROID , LEVOTHROID) 75 MCG tablet [id number] Yes Take 75 mcg by mouth daily before breakfast. [provider]  Active Child, Pharmacy Records  lidocaine  (LIDODERM ) 5 % [id number] Yes PLACE 1 PATCH ONTO THE SKIN DAILY. REMOVE & DISCARD PATCH WITHIN 12 HOURS OR AS DIRECTED BY MD Del Wilhelmena Falter Hilario, FNP  Active Child, Pharmacy Records  ondansetron  (ZOFRAN ) 4 MG tablet [id number] Yes Take 1 tablet (4 mg total) by mouth every 8 (eight) hours as needed for nausea or vomiting. Rudy Josette RAMAN, PA-C  Active Child, Pharmacy Records  oxybutynin  (DITROPAN  XL) 15 MG 24 hr tablet [id number] Yes Take 1 tablet (15 mg total) by mouth at bedtime. Del Wilhelmena Falter Hilario, FNP  Active Child, Pharmacy Records  pantoprazole  (PROTONIX ) 40 MG tablet [id number] Yes TAKE 1 TABLET BY MOUTH TWICE A DAY Rudy Josette RAMAN, PA-C  Active Child, Pharmacy Records  polyethylene glycol-electrolytes (NULYTELY ) 420 g solution [id number]  Yes Take 4,000 mLs by mouth once. [provider]  Active Child, Pharmacy Records  potassium chloride  SA (KLOR-CON  M) 20 MEQ tablet [id number] Yes Take 1 tablet (20 mEq total) by mouth daily. Darci Pore, MD  Active   rosuvastatin   (CRESTOR ) 40 MG tablet [id number] Yes Take 1 tablet (40 mg total) by mouth daily. Del Wilhelmena Falter, Hilario, FNP  Active Child, Pharmacy Records  sucralfate  (CARAFATE ) 1 g tablet [id number] Yes Take 1 tablet (1 g total) by mouth 4 (four) times daily. Luz Skelton, MD  Active Child, Pharmacy Records  venlafaxine  (EFFEXOR ) 75 MG tablet [id number] Yes TAKE 1 TABLET BY MOUTH TWICE A DAY Del Wilhelmena Falter, [place], FNP  Active Child, Pharmacy Records  Vitamin D , Ergocalciferol , (DRISDOL ) 1.25 MG (50000 UNIT) CAPS capsule [id number] Yes Take 1 capsule (50,000 Units total) by mouth every Monday. Del Wilhelmena Falter Hilario, FNP  Active Child, Pharmacy Records  Med List Note (Ward, Angelica, CPhT 09/02/23 1603): Pt's daughter handles medications            Home Care and Equipment/Supplies: Were Home Health Services Ordered?: Yes Name of Home Health Agency:: [facility] Has Agency set up a time to come to your home?: No Any new equipment or medical supplies ordered?: No  Functional Questionnaire: Do you need assistance with bathing/showering or dressing?: Yes Do you need assistance with meal preparation?: Yes Do you need assistance with eating?: No Do you have difficulty maintaining continence: Yes Do you need assistance with getting out of bed/getting out of a chair/moving?: Yes Do you have difficulty managing or taking your medications?: Yes  Follow up appointments reviewed: PCP Follow-up appointment confirmed?: No (sent to admin pool for assistance with scheduling) Do you need transportation to your follow-up appointment?: No Do you understand care options if your condition(s) worsen?: Yes-patient verbalized understanding    SIGNATURE Charmaine Bloodgood, LPN [facility] Health Advisor [facility] You Are. We Are. [facility] Direct Dial [phone]

## 2023-11-01 NOTE — Telephone Encounter (Signed)
 Pt scheduled

## 2023-11-01 NOTE — Telephone Encounter (Signed)
 Pt's daughter [name] (on dpr) called to reschedule pt's procedure. She states pt has had a recent hospitalization and she feels she is just too weak to go through the procedures right now. [name] states that pt will be getting PT and OT. She will call back to reschedule once her mom has gained some of her strength back.

## 2023-11-02 NOTE — Telephone Encounter (Signed)
 Please arrange non-urgent visit with me in November.

## 2023-11-03 ENCOUNTER — Encounter (HOSPITAL_COMMUNITY): Admission: RE | Admit: 2023-11-03 | Source: Ambulatory Visit

## 2023-11-04 ENCOUNTER — Telehealth: Payer: Self-pay | Admitting: Cardiology

## 2023-11-04 NOTE — Telephone Encounter (Signed)
 Left message with daughter to return call.

## 2023-11-04 NOTE — Telephone Encounter (Signed)
 STAT if patient feels like he/she is going to faint   Are you dizzy, lightheaded, or faint now?  Not currently with patient, but says it has been ongoing for a while + multiple ED visits.  Have you passed out?  No   Do you have any other symptoms?  No   Have you checked your HR and BP (record if available)?  Today readings were 109/69 84, per daughter

## 2023-11-05 ENCOUNTER — Other Ambulatory Visit: Payer: Self-pay | Admitting: Family Medicine

## 2023-11-09 ENCOUNTER — Ambulatory Visit (HOSPITAL_COMMUNITY): Admit: 2023-11-09 | Admitting: Internal Medicine

## 2023-11-09 ENCOUNTER — Encounter (HOSPITAL_COMMUNITY): Payer: Self-pay

## 2023-11-09 SURGERY — COLONOSCOPY
Anesthesia: Choice

## 2023-11-11 ENCOUNTER — Telehealth: Payer: Self-pay

## 2023-11-11 NOTE — Telephone Encounter (Signed)
 Copied from CRM [id number]. Topic: Clinical - Order For Equipment >> Nov 11, 2023  9:38 AM Edsel HERO wrote: Manuelita with [facility] called to request that a hospital bed be ordered for patient. Please advise. [phone]

## 2023-11-14 ENCOUNTER — Encounter (HOSPITAL_COMMUNITY): Payer: Self-pay | Admitting: Internal Medicine

## 2023-11-14 ENCOUNTER — Other Ambulatory Visit: Payer: Self-pay

## 2023-11-14 ENCOUNTER — Ambulatory Visit: Payer: Self-pay

## 2023-11-14 ENCOUNTER — Emergency Department (HOSPITAL_COMMUNITY)

## 2023-11-14 ENCOUNTER — Telehealth: Payer: Self-pay | Admitting: Cardiology

## 2023-11-14 ENCOUNTER — Inpatient Hospital Stay (HOSPITAL_COMMUNITY)
Admission: EM | Admit: 2023-11-14 | Discharge: 2023-11-17 | DRG: 392 | Disposition: A | Discharge status: Hospice | Attending: Hospitalist | Admitting: Hospitalist

## 2023-11-14 ENCOUNTER — Ambulatory Visit: Admitting: Family Medicine

## 2023-11-14 DIAGNOSIS — Z886 Allergy status to analgesic agent status: Secondary | ICD-10-CM

## 2023-11-14 DIAGNOSIS — Z7983 Long term (current) use of bisphosphonates: Secondary | ICD-10-CM

## 2023-11-14 DIAGNOSIS — E039 Hypothyroidism, unspecified: Secondary | ICD-10-CM | POA: Diagnosis present

## 2023-11-14 DIAGNOSIS — E860 Dehydration: Secondary | ICD-10-CM | POA: Diagnosis present

## 2023-11-14 DIAGNOSIS — I340 Nonrheumatic mitral (valve) insufficiency: Secondary | ICD-10-CM | POA: Diagnosis present

## 2023-11-14 DIAGNOSIS — R4189 Other symptoms and signs involving cognitive functions and awareness: Secondary | ICD-10-CM | POA: Diagnosis present

## 2023-11-14 DIAGNOSIS — F429 Obsessive-compulsive disorder, unspecified: Secondary | ICD-10-CM | POA: Diagnosis present

## 2023-11-14 DIAGNOSIS — N184 Chronic kidney disease, stage 4 (severe): Secondary | ICD-10-CM | POA: Diagnosis present

## 2023-11-14 DIAGNOSIS — Z888 Allergy status to other drugs, medicaments and biological substances status: Secondary | ICD-10-CM

## 2023-11-14 DIAGNOSIS — M810 Age-related osteoporosis without current pathological fracture: Secondary | ICD-10-CM | POA: Diagnosis present

## 2023-11-14 DIAGNOSIS — K5641 Fecal impaction: Secondary | ICD-10-CM | POA: Diagnosis present

## 2023-11-14 DIAGNOSIS — Z90710 Acquired absence of both cervix and uterus: Secondary | ICD-10-CM

## 2023-11-14 DIAGNOSIS — Z8249 Family history of ischemic heart disease and other diseases of the circulatory system: Secondary | ICD-10-CM

## 2023-11-14 DIAGNOSIS — R627 Adult failure to thrive: Secondary | ICD-10-CM | POA: Diagnosis present

## 2023-11-14 DIAGNOSIS — Z515 Encounter for palliative care: Secondary | ICD-10-CM

## 2023-11-14 DIAGNOSIS — Z66 Do not resuscitate: Secondary | ICD-10-CM | POA: Diagnosis present

## 2023-11-14 DIAGNOSIS — Z23 Encounter for immunization: Secondary | ICD-10-CM

## 2023-11-14 DIAGNOSIS — K6289 Other specified diseases of anus and rectum: Principal | ICD-10-CM

## 2023-11-14 DIAGNOSIS — Z818 Family history of other mental and behavioral disorders: Secondary | ICD-10-CM

## 2023-11-14 DIAGNOSIS — Z87820 Personal history of traumatic brain injury: Secondary | ICD-10-CM

## 2023-11-14 DIAGNOSIS — Z885 Allergy status to narcotic agent status: Secondary | ICD-10-CM

## 2023-11-14 DIAGNOSIS — I10 Essential (primary) hypertension: Secondary | ICD-10-CM | POA: Diagnosis not present

## 2023-11-14 DIAGNOSIS — G4733 Obstructive sleep apnea (adult) (pediatric): Secondary | ICD-10-CM | POA: Diagnosis present

## 2023-11-14 DIAGNOSIS — K219 Gastro-esophageal reflux disease without esophagitis: Secondary | ICD-10-CM | POA: Diagnosis present

## 2023-11-14 DIAGNOSIS — I4819 Other persistent atrial fibrillation: Secondary | ICD-10-CM | POA: Diagnosis present

## 2023-11-14 DIAGNOSIS — Z95818 Presence of other cardiac implants and grafts: Secondary | ICD-10-CM

## 2023-11-14 DIAGNOSIS — Z6825 Body mass index (BMI) 25.0-25.9, adult: Secondary | ICD-10-CM

## 2023-11-14 DIAGNOSIS — F0394 Unspecified dementia, unspecified severity, with anxiety: Secondary | ICD-10-CM | POA: Diagnosis present

## 2023-11-14 DIAGNOSIS — Z7409 Other reduced mobility: Secondary | ICD-10-CM | POA: Diagnosis present

## 2023-11-14 DIAGNOSIS — I129 Hypertensive chronic kidney disease with stage 1 through stage 4 chronic kidney disease, or unspecified chronic kidney disease: Secondary | ICD-10-CM | POA: Diagnosis present

## 2023-11-14 DIAGNOSIS — Z9884 Bariatric surgery status: Secondary | ICD-10-CM

## 2023-11-14 DIAGNOSIS — D631 Anemia in chronic kidney disease: Secondary | ICD-10-CM | POA: Diagnosis present

## 2023-11-14 DIAGNOSIS — K529 Noninfective gastroenteritis and colitis, unspecified: Principal | ICD-10-CM | POA: Diagnosis present

## 2023-11-14 DIAGNOSIS — Z79899 Other long term (current) drug therapy: Secondary | ICD-10-CM

## 2023-11-14 DIAGNOSIS — F32A Depression, unspecified: Secondary | ICD-10-CM | POA: Diagnosis present

## 2023-11-14 DIAGNOSIS — Z87891 Personal history of nicotine dependence: Secondary | ICD-10-CM

## 2023-11-14 DIAGNOSIS — G8929 Other chronic pain: Secondary | ICD-10-CM | POA: Diagnosis present

## 2023-11-14 DIAGNOSIS — Z79890 Hormone replacement therapy (postmenopausal): Secondary | ICD-10-CM

## 2023-11-14 DIAGNOSIS — E785 Hyperlipidemia, unspecified: Secondary | ICD-10-CM | POA: Diagnosis present

## 2023-11-14 DIAGNOSIS — Z981 Arthrodesis status: Secondary | ICD-10-CM

## 2023-11-14 LAB — LIPASE, BLOOD: Lipase: 39 U/L (ref 11–51)

## 2023-11-14 LAB — COMPREHENSIVE METABOLIC PANEL WITH GFR
ALT: 17 U/L (ref 0–44)
AST: 34 U/L (ref 15–41)
Albumin: 3.9 g/dL (ref 3.5–5.0)
Alkaline Phosphatase: 51 U/L (ref 38–126)
Anion gap: 11 (ref 5–15)
BUN: 27 mg/dL — ABNORMAL HIGH (ref 8–23)
CO2: 25 mmol/L (ref 22–32)
Calcium: 9.9 mg/dL (ref 8.9–10.3)
Chloride: 103 mmol/L (ref 98–111)
Creatinine, Ser: 2.04 mg/dL — ABNORMAL HIGH (ref 0.44–1.00)
GFR, Estimated: 25 mL/min — ABNORMAL LOW (ref >60)
Glucose, Bld: 104 mg/dL — ABNORMAL HIGH (ref 70–99)
Potassium: 4.7 mmol/L (ref 3.5–5.1)
Sodium: 140 mmol/L (ref 135–145)
Total Bilirubin: 0.5 mg/dL (ref 0.0–1.2)
Total Protein: 6.3 g/dL — ABNORMAL LOW (ref 6.5–8.1)

## 2023-11-14 LAB — CBC WITH DIFFERENTIAL/PLATELET
Abs Immature Granulocytes: 0.01 K/uL (ref 0.00–0.07)
Basophils Absolute: 0.1 K/uL (ref 0.0–0.1)
Basophils Relative: 1 %
Eosinophils Absolute: 0.1 K/uL (ref 0.0–0.5)
Eosinophils Relative: 1 %
HCT: 40.3 % (ref 36.0–46.0)
Hemoglobin: 12.6 g/dL (ref 12.0–15.0)
Immature Granulocytes: 0 %
Lymphocytes Relative: 26 %
Lymphs Abs: 1.8 K/uL (ref 0.7–4.0)
MCH: 29 pg (ref 26.0–34.0)
MCHC: 31.3 g/dL (ref 30.0–36.0)
MCV: 92.6 fL (ref 80.0–100.0)
Monocytes Absolute: 0.5 K/uL (ref 0.1–1.0)
Monocytes Relative: 7 %
Neutro Abs: 4.5 K/uL (ref 1.7–7.7)
Neutrophils Relative %: 65 %
Platelets: 125 K/uL — ABNORMAL LOW (ref 150–400)
RBC: 4.35 MIL/uL (ref 3.87–5.11)
RDW: 17 % — ABNORMAL HIGH (ref 11.5–15.5)
WBC: 6.9 K/uL (ref 4.0–10.5)
nRBC: 0 % (ref 0.0–0.2)

## 2023-11-14 MED ORDER — POLYETHYLENE GLYCOL 3350 17 G PO PACK
17.0000 g | PACK | Freq: Two times a day (BID) | ORAL | Status: AC
Start: 2023-11-15 — End: 2023-11-16
  Administered 2023-11-14 – 2023-11-15 (×3): 17 g via ORAL
  Filled 2023-11-14 (×3): qty 1

## 2023-11-14 MED ORDER — CIPROFLOXACIN IN D5W 400 MG/200ML IV SOLN
400.0000 mg | Freq: Once | INTRAVENOUS | Status: AC
  Administered 2023-11-14: 400 mg via INTRAVENOUS
  Filled 2023-11-14: qty 200

## 2023-11-14 MED ORDER — METRONIDAZOLE 500 MG/100ML IV SOLN
500.0000 mg | Freq: Two times a day (BID) | INTRAVENOUS | Status: DC
  Administered 2023-11-15 – 2023-11-16 (×4): 500 mg via INTRAVENOUS
  Filled 2023-11-14 (×6): qty 100

## 2023-11-14 MED ORDER — ENOXAPARIN SODIUM 30 MG/0.3ML IJ SOSY
30.0000 mg | PREFILLED_SYRINGE | INTRAMUSCULAR | Status: DC
  Administered 2023-11-15 – 2023-11-17 (×3): 30 mg via SUBCUTANEOUS
  Filled 2023-11-14 (×3): qty 0.3

## 2023-11-14 MED ORDER — ACETAMINOPHEN 325 MG PO TABS
650.0000 mg | ORAL_TABLET | Freq: Four times a day (QID) | ORAL | Status: DC | PRN
  Administered 2023-11-16: 650 mg via ORAL
  Filled 2023-11-14: qty 2

## 2023-11-14 MED ORDER — ACETAMINOPHEN 650 MG RE SUPP: 650.0000 mg | Freq: Four times a day (QID) | RECTAL | Status: DC | PRN

## 2023-11-14 MED ORDER — METRONIDAZOLE 500 MG/100ML IV SOLN
500.0000 mg | Freq: Once | INTRAVENOUS | Status: AC
  Administered 2023-11-14: 500 mg via INTRAVENOUS
  Filled 2023-11-14: qty 100

## 2023-11-14 MED ORDER — DONEPEZIL HCL 5 MG PO TABS
10.0000 mg | ORAL_TABLET | Freq: Every day | ORAL | Status: DC
  Administered 2023-11-14 – 2023-11-16 (×3): 10 mg via ORAL
  Filled 2023-11-14 (×3): qty 2

## 2023-11-14 MED ORDER — SENNOSIDES-DOCUSATE SODIUM 8.6-50 MG PO TABS
1.0000 | ORAL_TABLET | Freq: Two times a day (BID) | ORAL | Status: AC
  Administered 2023-11-14 – 2023-11-15 (×3): 1 via ORAL
  Filled 2023-11-14 (×3): qty 1

## 2023-11-14 MED ORDER — SODIUM CHLORIDE 0.9 % IV SOLN
INTRAVENOUS | Status: AC
Start: 2023-11-15 — End: 2023-11-15

## 2023-11-14 MED ORDER — SODIUM CHLORIDE 0.9 % IV SOLN: INTRAVENOUS | Status: DC

## 2023-11-14 MED ORDER — LEVOTHYROXINE SODIUM 75 MCG PO TABS
75.0000 ug | ORAL_TABLET | Freq: Every day | ORAL | Status: DC
  Administered 2023-11-15 – 2023-11-17 (×3): 75 ug via ORAL
  Filled 2023-11-14 (×3): qty 1

## 2023-11-14 MED ORDER — ONDANSETRON HCL 4 MG PO TABS: 4.0000 mg | ORAL_TABLET | Freq: Four times a day (QID) | ORAL | Status: DC | PRN

## 2023-11-14 MED ORDER — SODIUM CHLORIDE 0.9 % IV BOLUS
1000.0000 mL | Freq: Once | INTRAVENOUS | Status: AC
  Administered 2023-11-14: 1000 mL via INTRAVENOUS

## 2023-11-14 MED ORDER — DILTIAZEM HCL ER COATED BEADS 180 MG PO CP24
180.0000 mg | ORAL_CAPSULE | Freq: Every day | ORAL | Status: DC
  Administered 2023-11-15 – 2023-11-17 (×3): 180 mg via ORAL
  Filled 2023-11-14 (×3): qty 1

## 2023-11-14 MED ORDER — ONDANSETRON HCL 4 MG/2ML IJ SOLN: 4.0000 mg | Freq: Four times a day (QID) | INTRAMUSCULAR | Status: DC | PRN

## 2023-11-14 MED ORDER — DILTIAZEM HCL ER COATED BEADS 180 MG PO CP24: 180.0000 mg | ORAL_CAPSULE | Freq: Every day | ORAL | 2 refills | Status: DC

## 2023-11-14 MED ORDER — SODIUM CHLORIDE 0.9 % IV SOLN
2.0000 g | INTRAVENOUS | Status: DC
  Administered 2023-11-15 – 2023-11-16 (×2): 2 g via INTRAVENOUS
  Filled 2023-11-14 (×3): qty 20

## 2023-11-14 NOTE — Telephone Encounter (Signed)
 Daughter calling stating that she has spoke to NT already today. Pt is wanting to stay in bed and has odor to urine. OT was at her home and states that pt had a low grade fever. Daughter calling to say that she would like her mother to go to ED. Daughter agreeable to call EMS, daughter placing call.   Copied from CRM [id number]. Topic: Clinical - Red Word Triage >> Nov 14, 2023  3:26 PM Everette C wrote: [facility] that prompted transfer to Nurse Triage: The patient's daughter is concerned that the patient may be experiencing a significant urinary tract infection and may need transportation by ambulance

## 2023-11-14 NOTE — Telephone Encounter (Signed)
*  STAT* If patient is at the pharmacy, call can be transferred to refill team.   1. Which medications need to be refilled? (please list name of each medication and dose if known) diltiazem  (CARDIZEM  CD) 180 MG 24 hr capsule    2. Would you like to learn more about the convenience, safety, & potential cost savings by using the [facility]?    3. Are you open to using the [facility] (Type [facility].  ).   4. Which pharmacy/location (including street and city if local pharmacy) is medication to be sent to? [id number] - [place], [place] - [address]    5. Do they need a 30 day or 90 day supply? 30 day Patient is out of medication

## 2023-11-14 NOTE — ED Notes (Signed)
 Pt's daughter, Dorthea, given phone via phone.

## 2023-11-14 NOTE — ED Triage Notes (Signed)
 Pt released home few wks ago and per her daughter, she has been weaker ever since and is concerned that she may have a UTI. Not diabetic, and per pt no blood thinners daily.

## 2023-11-14 NOTE — Telephone Encounter (Signed)
 Patient is at ED

## 2023-11-14 NOTE — H&P (Signed)
 History and Physical    [name] FMW:[id number] DOB: 25-Nov-1945 DOA: 11/14/2023  PCP: Kendra Wilhelmena Lloyd Hilario, FNP   Patient coming from: Home  I have personally briefly reviewed patient's old medical records in [facility]  Chief Complaint: [name]  HPI: [name] is a 78 y.o. female with medical history significant for atrial fibrillation with Watchman device, cognitive impairment, traumatic brain injury, prediabetes, hypertension, CKD 3. Patient was brought to the ED reports that patient is progressive weakness, sleeping most of the day, and odor to urine, daughter was concerned for UTI, with generalized abdominal [name].  My evaluation patient is awake and alert, speech is a bit delayed, and she is unable to answer some questions. Patient tells me her last bowel movement was about 2 days ago-normal consistency.  No vomiting.  She denies chest [name] no difficulty breathing.  No cough. Patient recently hospitalized 10/10 to 10/13 for AKI on CKD, adult failure to thrive.  ED Course: Temperature 97.6.  Heart rate 78-104.  Respiratory rate 18-25.  Blood pressure systolic 114-130.  O2 sats greater than 94% on room air. WBC 6.9. CTAP Wo C- Rectal wall thickening with surrounding inflammatory stranding and presacral edema, compatible with proctitis/colitis; correlate clinically. Wall thickening of the ascending colon, which may reflect additional colitis; correlate clinically. Dilated, stool-filled rectum measuring 8 cm, which can be seen with fecal impaction/obstipation; correlate with symptoms and consider decompression if clinically indicated. Rectal exam done in ED, with normal stool. 1 L bolus given.  IV Cipro and metronidazole started.  Review of Systems: As per HPI all other systems reviewed and negative.  Past Medical History:  Diagnosis Date   Anemia    Anxiety    Arthritis    Chronic back [name]    Spondylosis and stenosis   Depression    GERD  (gastroesophageal reflux disease)    Headache(784.0)    Hiatal hernia    History of blood transfusion    History of GI bleed    Hypothyroidism    Mitral regurgitation    Obsessive-compulsive disorder    Persistent atrial fibrillation (HCC)    Presence of Watchman left atrial appendage closure device 12/03/2021   31mm Watchman FLX with Dr. Wonda   Recurrent upper respiratory infection (URI)    Sleep apnea     Past Surgical History:  Procedure Laterality Date   ABDOMINAL HYSTERECTOMY     ANTERIOR CERVICAL DECOMP/DISCECTOMY FUSION  12/09/2010   Procedure: ANTERIOR CERVICAL DECOMPRESSION/DISCECTOMY FUSION 3 LEVELS;  Surgeon: [name];  Location: [facility];  Service: Neurosurgery;  Laterality: N/A;  Cervical three-four,Cervical four-five,Cervical Five-Six,Cervical Six-Seven ANTERIOR CERVICAL DECOMPRESSION WITH FUSION INTERBODY PROTHESIS PLATING AND BONEGRAFT   APPENDECTOMY     BIOPSY N/A 11/14/2014   Procedure: GASTRIC BIOPSY;  Surgeon: Kendra Kendra Hollingshead, MD;  Location: [facility];  Service: Endoscopy;  Laterality: N/A;   CESAREAN SECTION     CHOLECYSTECTOMY     COLONOSCOPY WITH PROPOFOL  N/A 11/14/2014   RMR: redundant colon but otherwise normal   COLONOSCOPY WITH PROPOFOL  N/A 05/12/2020   Dr. Hollingshead;  Seven 4 to 7 mm polyps, diverticulosis in the sigmoid colon. Pathology with tubular adenomas. Recommended repeat in 3 years.   ESOPHAGOGASTRODUODENOSCOPY N/A 09/02/2023   Procedure: EGD (ESOPHAGOGASTRODUODENOSCOPY);  Surgeon: Kendra Deatrice FALCON, MD;  Location: [facility];  Service: Endoscopy;  Laterality: N/A;   ESOPHAGOGASTRODUODENOSCOPY (EGD) WITH PROPOFOL  N/A 11/14/2014   RMRs/p gastric surgery/anastomtic ulcer   ESOPHAGOGASTRODUODENOSCOPY (EGD) WITH PROPOFOL  N/A  09/15/2017   Procedure: ESOPHAGOGASTRODUODENOSCOPY (EGD) WITH PROPOFOL ;  Surgeon: Kendra Kendra HERO, MD;  Location: [facility];  Service: Endoscopy;  Laterality: N/A;  11:30am   ESOPHAGOGASTRODUODENOSCOPY (EGD) WITH  PROPOFOL  N/A 04/04/2019   2 cm ulcer crater at anastomosis with smaller satellite areas of ulceration in setting of NSAIDs/Goody powders, no bleeding stigmata.   ESOPHAGOGASTRODUODENOSCOPY (EGD) WITH PROPOFOL  N/A 06/13/2019   Rourk: Prior gastric bypass surgery with Billroth II type anatomy, otherwise normal-appearing residual upper GI tract.   ESOPHAGOGASTRODUODENOSCOPY (EGD) WITH PROPOFOL  N/A 12/01/2022   Procedure: ESOPHAGOGASTRODUODENOSCOPY (EGD) WITH PROPOFOL ;  Surgeon: Kendra Kendra HERO, MD;  Location: [facility];  Service: Endoscopy;  Laterality: N/A;  830am, asa 3   EYE SURGERY     bilateral cataracts removed, w/IOL   GASTRIC BYPASS  2008   JOINT REPLACEMENT     07/2010 &  2002- respectively- both knees    LEFT ATRIAL APPENDAGE OCCLUSION N/A 12/03/2021   Procedure: LEFT ATRIAL APPENDAGE OCCLUSION;  Surgeon: Kendra Sharper, MD;  Location: [facility] INVASIVE CV LAB;  Service: Cardiovascular;  Laterality: N/A;   MALONEY [name] N/A 09/15/2017   Procedure: [name];  Surgeon: Kendra Kendra HERO, MD;  Location: [facility];  Service: Endoscopy;  Laterality: N/A;   MALONEY [name] N/A 12/01/2022   Procedure: [name];  Surgeon: Kendra Kendra HERO, MD;  Location: [facility];  Service: Endoscopy;  Laterality: N/A;   PARATHYROIDECTOMY     PERIPHERALLY INSERTED CENTRAL CATHETER INSERTION     PICC line for treatment of MRSA   POLYPECTOMY  05/12/2020   Procedure: POLYPECTOMY;  Surgeon: Kendra Kendra HERO, MD;  Location: [facility];  Service: Endoscopy;;   TEE WITHOUT CARDIOVERSION N/A 12/03/2021   Procedure: TRANSESOPHAGEAL ECHOCARDIOGRAM (TEE);  Surgeon: Kendra Sharper, MD;  Location: [facility] INVASIVE CV LAB;  Service: Cardiovascular;  Laterality: N/A;   TONSILLECTOMY     as a child     reports that she quit smoking about 25 years ago. Her smoking use included cigarettes. She started smoking about 55 years ago. She has a 30 pack-year smoking history. She has never used smokeless  tobacco. She reports that she does not drink alcohol and does not use drugs.  Allergies  Allergen Reactions   Benzodiazepines Other (See Comments)    Cloudy thinking, memory loss, withdrawal symptoms when trying to stop without any other medication for detox.    Nsaids Other (See Comments)    Bleeding ulcer    Statins Nausea And Vomiting   Codeine Nausea Only   Escitalopram Oxalate Nausea Only   Sertraline  Hcl Nausea Only   Tramadol Hcl Other (See Comments)    Daughter doesn't want mother to take due to the fact that the medication made the daughter have a seizure     Family History  Problem Relation Age of Onset   Anxiety disorder Mother    Breast cancer Mother    Heart attack Father    Anxiety disorder Sister    Breast cancer Sister    Bipolar disorder Daughter    OCD Daughter    Anesthesia problems Neg Hx    Hypotension Neg Hx    Malignant hyperthermia Neg Hx    Pseudochol deficiency Neg Hx    Dementia Neg Hx    Alcohol abuse Neg Hx    Drug abuse Neg Hx    Depression Neg Hx    Paranoid behavior Neg Hx    Schizophrenia Neg Hx    Seizures Neg Hx  Sexual abuse Neg Hx    Physical abuse Neg Hx    Colon cancer Neg Hx     Prior to Admission medications   Medication Sig Start Date End Date Taking? Authorizing Provider  acetaminophen  (TYLENOL ) 500 MG tablet Take 1,000 mg by mouth every 6 (six) hours as needed for mild [name] ([name] score 1-3).    [provider]  alendronate  (FOSAMAX ) 70 MG tablet Take 1 tablet (70 mg total) by mouth every 7 (seven) days. Take with a full glass of water  on an empty stomach. Patient taking differently: Take 70 mg by mouth every Monday. Take with a full glass of water  on an empty stomach. 06/10/23   Del Wilhelmena Falter, Hilario, FNP  atenolol  (TENORMIN ) 50 MG tablet Take 50 mg by mouth daily. 10/13/23   [provider]  cyanocobalamin (VITAMIN B12) 1000 MCG/ML injection Inject 1,000 mcg into the muscle every 30 (thirty) days.     [provider]  diltiazem  (CARDIZEM  CD) 180 MG 24 hr capsule Take 1 capsule (180 mg total) by mouth daily. 11/14/23   Debera Jayson MATSU, MD  donepezil  (ARICEPT ) 10 MG tablet Take 1 tablet (10 mg total) by mouth at bedtime. 08/18/23   Dohmeier, Dedra, MD  levothyroxine  (SYNTHROID , LEVOTHROID) 75 MCG tablet Take 75 mcg by mouth daily before breakfast.    [provider]  lidocaine  (LIDODERM ) 5 % PLACE 1 PATCH ONTO THE SKIN DAILY. REMOVE & DISCARD PATCH WITHIN 12 HOURS OR AS DIRECTED BY MD 08/23/23   Del Orbe Polanco, Iliana, FNP  ondansetron  (ZOFRAN ) 4 MG tablet Take 1 tablet (4 mg total) by mouth every 8 (eight) hours as needed for nausea or vomiting. 06/08/23   Rudy Josette RAMAN, PA-C  oxybutynin  (DITROPAN  XL) 15 MG 24 hr tablet Take 1 tablet (15 mg total) by mouth at bedtime. 05/12/23   Del Orbe Polanco, Hilario, FNP  pantoprazole  (PROTONIX ) 40 MG tablet TAKE 1 TABLET BY MOUTH TWICE A DAY 09/07/23   Rudy Josette S, PA-C  polyethylene glycol-electrolytes (NULYTELY ) 420 g solution Take 4,000 mLs by mouth once. 09/28/23   [provider]  potassium chloride  SA (KLOR-CON  M) 20 MEQ tablet Take 1 tablet (20 mEq total) by mouth daily. 10/31/23   Darci Pore, MD  rosuvastatin  (CRESTOR ) 40 MG tablet Take 1 tablet (40 mg total) by mouth daily. 05/18/23   Del Wilhelmena Falter Hilario, FNP  sucralfate  (CARAFATE ) 1 g tablet Take 1 tablet (1 g total) by mouth 4 (four) times daily. 09/06/23 11/01/23  Sira, Zackery, MD  venlafaxine  (EFFEXOR ) 75 MG tablet TAKE 1 TABLET BY MOUTH TWICE A DAY 10/06/23   Del Wilhelmena Falter, Quincy, FNP  Vitamin D , Ergocalciferol , (DRISDOL ) 1.25 MG (50000 UNIT) CAPS capsule Take 1 capsule (50,000 Units total) by mouth every Monday. 05/16/23   Del Wilhelmena Falter Hilario, FNP    Physical Exam: Vitals:   11/14/23 2115 11/14/23 2130 11/14/23 2145 11/14/23 2200  BP: (!) 149/85 (!) 135/94 (!) 143/91   Pulse:  92 91 78  Resp: (!) 25 17 (!) 21 18  Temp:      SpO2:   91% 94% 97%  Weight:      Height:        Constitutional: NAD, calm, comfortable Vitals:   11/14/23 2115 11/14/23 2130 11/14/23 2145 11/14/23 2200  BP: (!) 149/85 (!) 135/94 (!) 143/91   Pulse:  92 91 78  Resp: (!) 25 17 (!) 21 18  Temp:      SpO2:  91% 94% 97%  Weight:      Height:       Eyes: PERRL, lids and conjunctivae normal ENMT: Mucous membranes are moist. Posterior pharynx clear of any exudate or lesions.Normal dentition.  Neck: normal, supple, no masses, no thyromegaly Respiratory: clear to auscultation bilaterally, no wheezing, no crackles. Normal respiratory effort. No accessory muscle use.  Cardiovascular: Regular rate and rhythm, no murmurs / rubs / gallops. No extremity edema.  Extremities warm. Abdomen: Mild diffuse abdominal tenderness, no masses palpated. No hepatosplenomegaly.  Musculoskeletal: no clubbing / cyanosis. No joint deformity upper and lower extremities.  Skin: no rashes, lesions, ulcers. No induration Neurologic: Moving extremities spontaneously, no facial asymmetry. Psychiatric: Awake and alert oriented to person place and to some extent situation.  She is able to answer most questions, but responses are sometimes delayed..   Labs on Admission: I have personally reviewed following labs and imaging studies  CBC: Recent Labs  Lab 11/14/23 1725  WBC 6.9  NEUTROABS 4.5  HGB 12.6  HCT 40.3  MCV 92.6  PLT 125*   Basic Metabolic Panel: Recent Labs  Lab 11/14/23 1725  NA 140  K 4.7  CL 103  CO2 25  GLUCOSE 104*  BUN 27*  CREATININE 2.04*  CALCIUM  9.9   GFR: Estimated Creatinine Clearance: 22.5 mL/min (A) (by C-G formula based on SCr of 2.04 mg/dL (H)). Liver Function Tests: Recent Labs  Lab 11/14/23 1725  AST 34  ALT 17  ALKPHOS 51  BILITOT 0.5  PROT 6.3*  ALBUMIN 3.9   Recent Labs  Lab 11/14/23 1725  LIPASE 39   Urine analysis:    Component Value Date/Time   COLORURINE YELLOW 10/28/2023 1930   APPEARANCEUR HAZY (A)  10/28/2023 1930   APPEARANCEUR Cloudy (A) 05/12/2023 1505   LABSPEC 1.014 10/28/2023 1930   PHURINE 6.0 10/28/2023 1930   GLUCOSEU NEGATIVE 10/28/2023 1930   HGBUR NEGATIVE 10/28/2023 1930   BILIRUBINUR NEGATIVE 10/28/2023 1930   BILIRUBINUR Negative 05/12/2023 1505   KETONESUR NEGATIVE 10/28/2023 1930   PROTEINUR 100 (A) 10/28/2023 1930   UROBILINOGEN 0.2 05/09/2012 1741   NITRITE NEGATIVE 10/28/2023 1930   LEUKOCYTESUR NEGATIVE 10/28/2023 1930    Radiological Exams on Admission: CT ABDOMEN PELVIS WO CONTRAST Result Date: 11/14/2023 EXAM: CT ABDOMEN AND PELVIS WITHOUT CONTRAST 11/14/2023 06:17:26 PM TECHNIQUE: CT of the abdomen and pelvis was performed without the administration of intravenous contrast. Multiplanar reformatted images are provided for review. Automated exposure control, iterative reconstruction, and/or weight-based adjustment of the mA/kV was utilized to reduce the radiation dose to as low as reasonably achievable. COMPARISON: CT abdomen and pelvis 09/28. CLINICAL HISTORY: Abdominal [name], acute, nonlocalized; lower abd [name], no recent BM. Pt released home few wks ago and per her daughter, she has been weaker ever since and is concerned that she may have a UTI. Not diabetic, and per pt no blood thinners daily. FINDINGS: LOWER CHEST: There is atelectasis or scarring in the left lung base. LIVER: The liver is unremarkable. GALLBLADDER AND BILE DUCTS: Gallbladder surgically absent. No biliary ductal dilatation. SPLEEN: No acute abnormality. PANCREAS: No acute abnormality. ADRENAL GLANDS: No acute abnormality. KIDNEYS, URETERS AND BLADDER: There are punctate nonobstructing bilateral renal calculi, left greater than right. No hydronephrosis. No perinephric or periureteral stranding. Urinary bladder is unremarkable. GI AND BOWEL: Postsurgical changes in the stomach, likely gastric bypass. There is wall thickening of the ascending colon. The appendix is not visualized. The rectum is  dilated and stool field measuring 8 cm. There  is rectal wall thickening with surrounding inflammatory stranding and presacral edema. There is no bowel obstruction. PERITONEUM AND RETROPERITONEUM: No ascites. No free air. VASCULATURE: Aorta is normal in caliber. There are atherosclerotic calcifications of the aorta and iliac arteries. LYMPH NODES: No lymphadenopathy. REPRODUCTIVE ORGANS: The uterus is surgically absent. BONES AND SOFT TISSUES: L4-L5 posterior fusion hardware is present. No acute osseous abnormality. No focal soft tissue abnormality. IMPRESSION: 1. Rectal wall thickening with surrounding inflammatory stranding and presacral edema, compatible with proctitis/colitis; correlate clinically. 2. Wall thickening of the ascending colon, which may reflect additional colitis; correlate clinically. 3. Dilated, stool-filled rectum measuring 8 cm, which can be seen with fecal impaction/obstipation; correlate with symptoms and consider decompression if clinically indicated. 4. Bilateral nonobstructing renal calculi. Electronically signed by: Greig Pique MD 11/14/2023 07:07 PM EDT RP Workstation: [id number]   EKG: Independently reviewed.  Atrial fibrillation, rate 94, QTc 394.  No significant change from prior.  Assessment/Plan Principal Problem:   Colitis Active Problems:   Essential hypertension   Hypothyroidism   Persistent atrial fibrillation (HCC)   Presence of Watchman left atrial appendage closure device   Moderate cognitive impairment   CKD (chronic kidney disease) stage 4, GFR 15-29 ml/min (HCC)   Assessment and Plan:  Colitis-abdominal [name], progressive weakness, afebrile, WBC 6.9.  Resolved for sepsis.  CTAP Wo C-rectal wall thickening with stranding and presacral edema compatible with proctitis/colitis, also involving the ascending colon.  Dilated stool-filled rectum measuring 8 cm, fecal impaction/medication.  Rectal exam done in ED, normal stool. - IV Cipro and metronidazole started  in the ED will continue with ceftriaxone and metronidazole - 1 L bolus given, continue NS at 75 cc/h x 20hrs - Bowel rest with clear liquid diet - Bowel regimen - UA pending  Hypertension-stable. - Resume Cardizem  180 mg daily - Awaiting med rec  Atrial fibrillation, Watchman device-controlled. - Resume Cardizem  - Awaiting med rec  CKD 4-creatinine 2.04.  At baseline.  Hypothyroidism -Resume Synthroid   DVT prophylaxis: Lovenox Code Status: Full Family Communication: none at bedside Disposition Plan: ~  2days Consults called: none Admission status:  inpt tele I certify that at the point of admission it is my clinical judgment that the patient will require inpatient hospital care spanning beyond 2 midnights from the point of admission due to high intensity of service, high risk for further deterioration and high frequency of surveillance required.   Author: Tully FORBES Carwin, MD 11/14/2023 11:09 PM  For on call review [email or web address].

## 2023-11-14 NOTE — Telephone Encounter (Signed)
 Sent in refill

## 2023-11-14 NOTE — ED Provider Notes (Signed)
 [place] EMERGENCY DEPARTMENT AT [facility] Provider Note   CSN: [id number] Arrival date & time: 11/14/23  1619     Patient presents with: Abdominal Pain   [name] is a 78 y.o. female.   HPI Elderly female presents via EMS.  Patient was evaluated 2 weeks ago found to have worsening renal function.  Since that time she has been weaker, with new abdominal pain, minimal bowel movements. Patient describes pain in her suprapubic region, denies of abdominal or chest pain.  EMS reports the patient was withdrawn, ANO x 2 in transport.     Prior to Admission medications   Medication Sig Start Date End Date Taking? Authorizing Provider  acetaminophen  (TYLENOL ) 500 MG tablet Take 1,000 mg by mouth every 6 (six) hours as needed for mild pain (pain score 1-3).    [provider]  alendronate  (FOSAMAX ) 70 MG tablet Take 1 tablet (70 mg total) by mouth every 7 (seven) days. Take with a full glass of water  on an empty stomach. Patient taking differently: Take 70 mg by mouth every Monday. Take with a full glass of water  on an empty stomach. 06/10/23   Del Wilhelmena Lloyd Sola, FNP  atenolol  (TENORMIN ) 50 MG tablet Take 50 mg by mouth daily. 10/13/23   [provider]  cyanocobalamin (VITAMIN B12) 1000 MCG/ML injection Inject 1,000 mcg into the muscle every 30 (thirty) days.    [provider]  diltiazem  (CARDIZEM  CD) 180 MG 24 hr capsule Take 1 capsule (180 mg total) by mouth daily. 11/14/23   Debera Jayson MATSU, MD  donepezil  (ARICEPT ) 10 MG tablet Take 1 tablet (10 mg total) by mouth at bedtime. 08/18/23   Dohmeier, Dedra, MD  levothyroxine  (SYNTHROID , LEVOTHROID) 75 MCG tablet Take 75 mcg by mouth daily before breakfast.    [provider]  lidocaine  (LIDODERM ) 5 % PLACE 1 PATCH ONTO THE SKIN DAILY. REMOVE & DISCARD PATCH WITHIN 12 HOURS OR AS DIRECTED BY MD 08/23/23   Del Orbe Polanco, Iliana, FNP  ondansetron  (ZOFRAN ) 4 MG tablet Take 1 tablet (4  mg total) by mouth every 8 (eight) hours as needed for nausea or vomiting. 06/08/23   Rudy Josette RAMAN, PA-C  oxybutynin  (DITROPAN  XL) 15 MG 24 hr tablet Take 1 tablet (15 mg total) by mouth at bedtime. 05/12/23   Del Orbe Polanco, Sola, FNP  pantoprazole  (PROTONIX ) 40 MG tablet TAKE 1 TABLET BY MOUTH TWICE A DAY 09/07/23   Rudy Josette S, PA-C  polyethylene glycol-electrolytes (NULYTELY ) 420 g solution Take 4,000 mLs by mouth once. 09/28/23   [provider]  potassium chloride  SA (KLOR-CON  M) 20 MEQ tablet Take 1 tablet (20 mEq total) by mouth daily. 10/31/23   Darci Pore, MD  rosuvastatin  (CRESTOR ) 40 MG tablet Take 1 tablet (40 mg total) by mouth daily. 05/18/23   Del Wilhelmena Lloyd Sola, FNP  sucralfate  (CARAFATE ) 1 g tablet Take 1 tablet (1 g total) by mouth 4 (four) times daily. 09/06/23 11/01/23  Sira, Zackery, MD  venlafaxine  (EFFEXOR ) 75 MG tablet TAKE 1 TABLET BY MOUTH TWICE A DAY 10/06/23   Del Wilhelmena Lloyd, Tustin, FNP  Vitamin D , Ergocalciferol , (DRISDOL ) 1.25 MG (50000 UNIT) CAPS capsule Take 1 capsule (50,000 Units total) by mouth every Monday. 05/16/23   Del Wilhelmena Lloyd Sola, FNP    Allergies: Benzodiazepines, Nsaids, Statins, Codeine, Escitalopram oxalate, Sertraline  hcl, and Tramadol hcl    Review of Systems  Updated Vital Signs BP 116/75   Pulse ROLLEN)  104   Temp 97.6 F (36.4 C)   Resp 20   Ht 1.651 m (5' 5)   Wt 69 kg   SpO2 100%   BMI 25.31 kg/m   Physical Exam Vitals and nursing note reviewed.  Constitutional:      General: She is not in acute distress.    Appearance: She is not toxic-appearing.  HENT:     Head: Normocephalic and atraumatic.  Eyes:     Conjunctiva/sclera: Conjunctivae normal.  Cardiovascular:     Rate and Rhythm: Normal rate and regular rhythm.  Pulmonary:     Effort: Pulmonary effort is normal. No respiratory distress.     Breath sounds: Normal breath sounds. No stridor.  Abdominal:     General: There is no  distension.     Tenderness: There is abdominal tenderness in the right lower quadrant, suprapubic area and left lower quadrant.  Skin:    General: Skin is warm and dry.  Neurological:     Mental Status: She is alert.     Cranial Nerves: No cranial nerve deficit.     Motor: Weakness and atrophy present.  Psychiatric:        Behavior: Behavior is slowed and withdrawn.     (all labs ordered are listed, but only abnormal results are displayed) Labs Reviewed  COMPREHENSIVE METABOLIC PANEL WITH GFR - Abnormal; Notable for the following components:      Result Value   Glucose, Bld 104 (*)    BUN 27 (*)    Creatinine, Ser 2.04 (*)    Total Protein 6.3 (*)    GFR, Estimated 25 (*)    All other components within normal limits  CBC WITH DIFFERENTIAL/PLATELET - Abnormal; Notable for the following components:   RDW 17.0 (*)    Platelets 125 (*)    All other components within normal limits  LIPASE, BLOOD  URINALYSIS, ROUTINE W REFLEX MICROSCOPIC    EKG: EKG Interpretation Date/Time:  Monday November 14 2023 17:56:03 EDT Ventricular Rate:  94 PR Interval:    QRS Duration:  102 QT Interval:  315 QTC Calculation: 394 R Axis:   -44  Text Interpretation: Atrial fibrillation Left axis deviation Abnormal R-wave progression, late transition Borderline repolarization abnormality Confirmed by Garrick Charleston [phone]) on 11/14/2023 7:39:46 PM  Radiology: CT ABDOMEN PELVIS WO CONTRAST Result Date: 11/14/2023 EXAM: CT ABDOMEN AND PELVIS WITHOUT CONTRAST 11/14/2023 06:17:26 PM TECHNIQUE: CT of the abdomen and pelvis was performed without the administration of intravenous contrast. Multiplanar reformatted images are provided for review. Automated exposure control, iterative reconstruction, and/or weight-based adjustment of the mA/kV was utilized to reduce the radiation dose to as low as reasonably achievable. COMPARISON: CT abdomen and pelvis 09/28. CLINICAL HISTORY: Abdominal pain, acute,  nonlocalized; lower abd pain, no recent BM. Pt released home few wks ago and per her daughter, she has been weaker ever since and is concerned that she may have a UTI. Not diabetic, and per pt no blood thinners daily. FINDINGS: LOWER CHEST: There is atelectasis or scarring in the left lung base. LIVER: The liver is unremarkable. GALLBLADDER AND BILE DUCTS: Gallbladder surgically absent. No biliary ductal dilatation. SPLEEN: No acute abnormality. PANCREAS: No acute abnormality. ADRENAL GLANDS: No acute abnormality. KIDNEYS, URETERS AND BLADDER: There are punctate nonobstructing bilateral renal calculi, left greater than right. No hydronephrosis. No perinephric or periureteral stranding. Urinary bladder is unremarkable. GI AND BOWEL: Postsurgical changes in the stomach, likely gastric bypass. There is wall thickening of the ascending colon. The  appendix is not visualized. The rectum is dilated and stool field measuring 8 cm. There is rectal wall thickening with surrounding inflammatory stranding and presacral edema. There is no bowel obstruction. PERITONEUM AND RETROPERITONEUM: No ascites. No free air. VASCULATURE: Aorta is normal in caliber. There are atherosclerotic calcifications of the aorta and iliac arteries. LYMPH NODES: No lymphadenopathy. REPRODUCTIVE ORGANS: The uterus is surgically absent. BONES AND SOFT TISSUES: L4-L5 posterior fusion hardware is present. No acute osseous abnormality. No focal soft tissue abnormality. IMPRESSION: 1. Rectal wall thickening with surrounding inflammatory stranding and presacral edema, compatible with proctitis/colitis; correlate clinically. 2. Wall thickening of the ascending colon, which may reflect additional colitis; correlate clinically. 3. Dilated, stool-filled rectum measuring 8 cm, which can be seen with fecal impaction/obstipation; correlate with symptoms and consider decompression if clinically indicated. 4. Bilateral nonobstructing renal calculi. Electronically  signed by: Greig Pique MD 11/14/2023 07:07 PM EDT RP Workstation: [id number]     Procedures   Medications Ordered in the ED  0.9 %  sodium chloride  infusion (0 mLs Intravenous Stopped 11/14/23 1918)  ciprofloxacin (CIPRO) IVPB 400 mg (400 mg Intravenous New Bag/Given 11/14/23 2052)  metroNIDAZOLE (FLAGYL) IVPB 500 mg (has no administration in time range)  sodium chloride  0.9 % bolus 1,000 mL (1,000 mLs Intravenous New Bag/Given 11/14/23 2049)                                    Medical Decision Making Elderly female with multiple medical problems hypertension, CKD, anxiety, now presents with weakness, abdominal pain, after recent evaluation notable for worsening renal function.  Concern for bowel obstruction, infection, bacteremia, sepsis, dehydration. Case discussed at bedside with EMS on arrival.  Amount and/or Complexity of Data Reviewed Independent Historian: EMS External Data Reviewed: notes.    Details: Notes from 2 weeks ago with progression of CKD Labs: ordered. Decision-making details documented in ED Course. Radiology: ordered and independent interpretation performed. Decision-making details documented in ED Course. ECG/medicine tests: ordered and independent interpretation performed. Decision-making details documented in ED Course.  Risk Prescription drug management.   9:06 PM Repeat exam patient appears calm, continues to complain of abdominal pain.  I reviewed the CT imaging, concerning for proctitis and colitis.  Rectal exam performed with chaperone Therisa, nursing, notable for minimal discomfort, soft stool in the rectal vault.  With persistent tachycardia, colitis, proctitis, persistent poor renal function, patient has started IV antibiotics, fluid resuscitation, and with absence of numeric improvement since last hospitalization BUN 27, creatinine greater than 2, as well as evidence for SIRS with heart rate greater than 100, respiratory rate 20, and source of  infection, patient be admitted for further monitoring, management.    Final diagnoses:  Proctitis  Colitis  Dehydration    ED Discharge Orders     None          Garrick Charleston, MD 11/14/23 2106

## 2023-11-14 NOTE — Telephone Encounter (Signed)
  FYI Only or Action Required?: Action required by provider: clinical question for provider and update on patient condition.  Requesting antibiotics for possible UTI, patient is non ambulatory and diapered.  If not able to call in, please communicate with patient's daughter who will then get her to ED via EMS  Order for hospital bed has been submitted by [facility], daughter is calling to check the status on the order for the bed.  Patient was last seen in primary care on 10/27/2023 by Hua, Jayce G, DO.  Called Nurse Triage reporting Dysuria.  Symptoms began yesterday.  Interventions attempted: Nothing.  Symptoms are: gradually worsening.  Triage Disposition: See Physician Within 24 Hours  Patient/caregiver understands and will follow disposition?: Yes Copied from CRM [id number]. Topic: Clinical - Medical Advice >> Nov 14, 2023 10:48 AM Sophia H wrote: Reason for CRM: Daughter Camelia is calling in on behalf of patient. States OT is there now with her mother and took her temp and is running at 100. Patient also has bad smelling urine, wanting to know if PCP would be willing to call in an antibiotic for the patient to treat a possible UTI. Also following up on hospital bed request, Please reach out and advise # [phone]   [id number] - [place], [place] - [address] Reason for Disposition  Bad or foul-smelling urine  Answer Assessment - Initial Assessment Questions 1. SYMPTOM: What's the main symptom you're concerned about? (e.g., frequency, incontinence)     Foul smelling urine, fever 2. ONSET: When did the  symptoms  start?     One day ago 3. PAIN: Is there any pain? If Yes, ask: How bad is it? (Scale: 1-10; mild, moderate, severe)     No c/o back pain.  Underlying abdominal pain due to hernia 4. CAUSE: What do you think is causing the symptoms?     UTI caused by diaper wear 5. OTHER SYMPTOMS: Do you have any other  symptoms? (e.g., blood in urine, fever, flank pain, pain with urination)     Temperature, 100  Protocols used: Urinary Symptoms-A-AH

## 2023-11-14 NOTE — Telephone Encounter (Signed)
 Noted, patient scheduled 10/31

## 2023-11-15 ENCOUNTER — Encounter (HOSPITAL_COMMUNITY): Payer: Self-pay | Admitting: Internal Medicine

## 2023-11-15 DIAGNOSIS — Z515 Encounter for palliative care: Secondary | ICD-10-CM | POA: Diagnosis not present

## 2023-11-15 DIAGNOSIS — E860 Dehydration: Secondary | ICD-10-CM

## 2023-11-15 DIAGNOSIS — R4589 Other symptoms and signs involving emotional state: Secondary | ICD-10-CM

## 2023-11-15 DIAGNOSIS — K6289 Other specified diseases of anus and rectum: Secondary | ICD-10-CM | POA: Diagnosis not present

## 2023-11-15 DIAGNOSIS — Z789 Other specified health status: Secondary | ICD-10-CM

## 2023-11-15 DIAGNOSIS — Z7189 Other specified counseling: Secondary | ICD-10-CM | POA: Diagnosis not present

## 2023-11-15 DIAGNOSIS — Z95818 Presence of other cardiac implants and grafts: Secondary | ICD-10-CM

## 2023-11-15 DIAGNOSIS — Z66 Do not resuscitate: Secondary | ICD-10-CM

## 2023-11-15 DIAGNOSIS — K529 Noninfective gastroenteritis and colitis, unspecified: Secondary | ICD-10-CM | POA: Diagnosis not present

## 2023-11-15 LAB — BASIC METABOLIC PANEL WITH GFR
Anion gap: 9 (ref 5–15)
BUN: 24 mg/dL — ABNORMAL HIGH (ref 8–23)
CO2: 25 mmol/L (ref 22–32)
Calcium: 8.9 mg/dL (ref 8.9–10.3)
Chloride: 107 mmol/L (ref 98–111)
Creatinine, Ser: 1.77 mg/dL — ABNORMAL HIGH (ref 0.44–1.00)
GFR, Estimated: 29 mL/min — ABNORMAL LOW (ref >60)
Glucose, Bld: 88 mg/dL (ref 70–99)
Potassium: 4.5 mmol/L (ref 3.5–5.1)
Sodium: 141 mmol/L (ref 135–145)

## 2023-11-15 LAB — URINALYSIS, ROUTINE W REFLEX MICROSCOPIC
Bilirubin Urine: NEGATIVE
Glucose, UA: NEGATIVE mg/dL
Hgb urine dipstick: NEGATIVE
Ketones, ur: NEGATIVE mg/dL
Leukocytes,Ua: NEGATIVE
Nitrite: NEGATIVE
Protein, ur: NEGATIVE mg/dL
Specific Gravity, Urine: 1.012 (ref 1.005–1.030)
pH: 6 (ref 5.0–8.0)

## 2023-11-15 LAB — CBC
HCT: 35 % — ABNORMAL LOW (ref 36.0–46.0)
Hemoglobin: 11.1 g/dL — ABNORMAL LOW (ref 12.0–15.0)
MCH: 29.4 pg (ref 26.0–34.0)
MCHC: 31.7 g/dL (ref 30.0–36.0)
MCV: 92.6 fL (ref 80.0–100.0)
Platelets: 101 K/uL — ABNORMAL LOW (ref 150–400)
RBC: 3.78 MIL/uL — ABNORMAL LOW (ref 3.87–5.11)
RDW: 16.9 % — ABNORMAL HIGH (ref 11.5–15.5)
WBC: 6.1 K/uL (ref 4.0–10.5)
nRBC: 0 % (ref 0.0–0.2)

## 2023-11-15 MED ORDER — INFLUENZA VAC SPLIT HIGH-DOSE 0.5 ML IM SUSY
0.5000 mL | PREFILLED_SYRINGE | INTRAMUSCULAR | Status: AC
  Administered 2023-11-16: 0.5 mL via INTRAMUSCULAR
  Filled 2023-11-15: qty 0.5

## 2023-11-15 MED ORDER — PANTOPRAZOLE SODIUM 40 MG PO TBEC
40.0000 mg | DELAYED_RELEASE_TABLET | Freq: Two times a day (BID) | ORAL | Status: DC
  Administered 2023-11-15 – 2023-11-17 (×5): 40 mg via ORAL
  Filled 2023-11-15 (×5): qty 1

## 2023-11-15 NOTE — Care Management CC44 (Signed)
 Condition Code 44 Documentation Completed  Patient Details  Name: [name] MRN: [id number] Date of Birth: 10-13-1945   Condition Code 44 given:  Yes Patient signature on Condition Code 44 notice:  Yes Documentation of 2 MD's agreement:  Yes Code 44 added to claim:  Yes    Hoy DELENA Bigness, LCSW 11/15/2023, 1:34 PM

## 2023-11-15 NOTE — Evaluation (Signed)
 Physical Therapy Evaluation Patient Details Name: [name] MRN: [id number] DOB: [birth year] Today's Date: 11/15/2023  History of Present Illness  [name] is a 78 y.o. female with medical history significant for atrial fibrillation with Watchman device, cognitive impairment, traumatic brain injury, prediabetes, hypertension, CKD 3.  Patient was brought to the ED reports that patient is progressive weakness, sleeping most of the day, and odor to urine, daughter was concerned for UTI, with generalized abdominal pain.  My evaluation patient is awake and alert, speech is a bit delayed, and she is unable to answer some questions. Patient tells me her last bowel movement was about 2 days ago-normal consistency.  No vomiting.  She denies chest pain no difficulty breathing.  No cough.  Patient recently hospitalized 10/10 to 10/13 for AKI on CKD, adult failure to thrive.   Clinical Impression  Patient agreeable to PT evaluation. No family members during evaluation session, so unable to confirm patient's subjective history. Patient was alert and oriented to self, place, and month. Patient reports as household ambulator with RW and requiring assist with ADLs. This date, patient requires moderate assist to sit EOB due to general weakness throughout but declines further mobility due to increased stomach/abdomen pain. Pt demo fair seated balance while sitting EOB, total assist to donn socks. Pt abruptly begins transfer to return to supine position but requires assist, call button left within reach Patient will benefit from continued skilled physical therapy acutely and in recommended venue in order to address current deficits listed below under PT problem list.        If plan is discharge home, recommend the following: A lot of help with bathing/dressing/bathroom;A lot of help with walking and/or transfers;Assistance with feeding;Assistance with cooking/housework;Direct supervision/assist for medications  management;Direct supervision/assist for financial management;Help with stairs or ramp for entrance;Supervision due to cognitive status   Can travel by private vehicle        Equipment Recommendations None recommended by PT  Recommendations for Other Services       Functional Status Assessment Patient has had a recent decline in their functional status and demonstrates the ability to make significant improvements in function in a reasonable and predictable amount of time.     Precautions / Restrictions Precautions Precautions: Fall Recall of Precautions/Restrictions: Impaired Restrictions Weight Bearing Restrictions Per Provider Order: No      Mobility  Bed Mobility Overal bed mobility: Needs Assistance Bed Mobility: Supine to Sit, Sit to Supine     Supine to sit: Mod assist Sit to supine: Mod assist   General bed mobility comments: HOB flattened, pt able to initiate transfer with verbal cueing but assist needed for LE management and trunk elevation    Transfers General transfer comment: Pt declines further mobility due to inc pain in abdomen    Ambulation/Gait     General Gait Details: Pt declines further mobility due to inc pain in abdomen  Stairs    Wheelchair Mobility     Tilt Bed    Modified Rankin (Stroke Patients Only)       Balance Overall balance assessment: Needs assistance Sitting-balance support: Bilateral upper extremity supported, Feet supported Sitting balance-Leahy Scale: Fair Sitting balance - Comments: Seated EOB, posterior lean at times Postural control: Posterior lean     Standing balance comment: Pt declines further mobility due to inc pain in abdomen, would not stand this date             Pertinent Vitals/Pain Pain Assessment Pain Assessment: Faces  Faces Pain Scale: Hurts even more Pain Location: stomach Pain Descriptors / Indicators: Moaning, Guarding, Discomfort Pain Intervention(s): Limited activity within patient's  tolerance    Home Living Family/patient expects to be discharged to:: Private residence Living Arrangements: Children Available Help at Discharge: Family;Available PRN/intermittently Type of Home: House Home Access: Stairs to enter Entrance Stairs-Rails: Right Entrance Stairs-Number of Steps: 5-6   Home Layout: One level Home Equipment: Cane - quad;Wheelchair - manual Additional Comments: pt reports no change in home set up but pt with history of dementia. No family present to confirm previously charted info.    Prior Function Prior Level of Function : Needs assist       Physical Assist : ADLs (physical);Mobility (physical) Mobility (physical): Gait;Transfers ADLs (physical): Dressing;Bathing;IADLs Mobility Comments: Pt reports she ambulates with an AD in home, unsure if with any further assistance from family for transfer or while ambulating, QC vs SPC. ADLs Comments: Pt reports she's assisted with ADLs.     Extremity/Trunk Assessment   Upper Extremity Assessment Upper Extremity Assessment: Generalized weakness    Lower Extremity Assessment Lower Extremity Assessment: Generalized weakness (Pt generally weak throughout requiring assist for all mobility)    Cervical / Trunk Assessment Cervical / Trunk Assessment: Kyphotic  Communication   Communication Communication: No apparent difficulties    Cognition Arousal: Lethargic, Alert Behavior During Therapy: WFL for tasks assessed/performed   PT - Cognitive impairments: History of cognitive impairments, No family/caregiver present to determine baseline     PT - Cognition Comments: Pt alert to self, place, and month. Not year Following commands: Intact       Cueing Cueing Techniques: Verbal cues, Tactile cues, Visual cues     General Comments      Exercises     Assessment/Plan    PT Assessment Patient needs continued PT services;All further PT needs can be met in the next venue of care  PT Problem List  Decreased strength;Decreased mobility;Decreased range of motion;Decreased activity tolerance;Pain;Decreased balance       PT Treatment Interventions DME instruction;Gait training;Stair training;Functional mobility training;Neuromuscular re-education;Balance training;Therapeutic exercise;Therapeutic activities;Patient/family education    PT Goals (Current goals can be found in the Care Plan section)  Acute Rehab PT Goals Patient Stated Goal: Return home PT Goal Formulation: With patient Time For Goal Achievement: 11/29/23 Potential to Achieve Goals: Good    Frequency Min 3X/week     Co-evaluation               AM-PAC PT 6 Clicks Mobility  Outcome Measure Help needed turning from your back to your side while in a flat bed without using bedrails?: A Lot Help needed moving from lying on your back to sitting on the side of a flat bed without using bedrails?: A Lot Help needed moving to and from a bed to a chair (including a wheelchair)?: A Lot Help needed standing up from a chair using your arms (e.g., wheelchair or bedside chair)?: A Lot Help needed to walk in hospital room?: Total Help needed climbing 3-5 steps with a railing? : Total 6 Click Score: 10    End of Session Equipment Utilized During Treatment: Gait belt Activity Tolerance: Patient limited by pain Patient left: in bed;with call bell/phone within reach   PT Visit Diagnosis: Unsteadiness on feet (R26.81);Other abnormalities of gait and mobility (R26.89);Muscle weakness (generalized) (M62.81);Difficulty in walking, not elsewhere classified (R26.2);Pain Pain - part of body:  (Stomach)    Time: 9069-9043 PT Time Calculation (min) (ACUTE ONLY): 26 min  Charges:   PT Evaluation $PT Eval Moderate Complexity: 1 Mod   PT General Charges $$ ACUTE PT VISIT: 1 Visit        12:16 PM, 11/15/23 Atiyana Welte Powell-Butler, PT, DPT [place] with [facility]

## 2023-11-15 NOTE — Progress Notes (Signed)
 PROGRESS NOTE  [name]  DOB: [birth year]  PCP: Terry Wilhelmena Lloyd Hilario, FNP FMW:[id number]  DOA: 11/14/2023  LOS: 1 day  Hospital Day: 2  Subjective: Patient was seen and examined this morning. Pleasant, elderly Caucasian female.  Lying on bed.  Not in distress.  Not on supplemental oxygen.  Slow to respond.  Alert, awake, oriented to place.  I called her daughter on the phone from bedside but did not respond. Chart reviewed Remains afebrile, hemodynamically stable. Repeat labs this morning with WC count normal at 6.1, creatinine 1.77   Brief narrative: CABRINI RUGGIERI is a 78 y.o. female with PMH significant for A-fib with Watchman device, HTN, prediabetes, OSA, cognitive impairment, CKD, GERD, chronic anemia, arthritis, chronic back pain, hypothyroidism anxiety/depression. Most recently hospitalized 10/10 to 10/13 for AKI, failure to thrive.  PT recommended SNF but daughter refused and took her home with home health. 10/27, brought to the ED with complaint of progressive weakness, increased somnolence, foul-smelling urine, generalized abdominal pain.  In the ED, afebrile, hemodynamically stable, breathing on room air Labs with WC count normal, platelet 125, BUN/creatinine 27/2.04. CTAP Wo showed  -rectal wall thickening with surrounding inflammatory stranding and presacral edema, compatible with proctitis/colitis; -Wall thickening of the ascending colon, which may reflect additional colitis;  -Dilated, stool-filled rectum measuring 8 cm, which can be seen with fecal impaction/obstipation;   Rectal exam done in ED, showed normal stool. Given IV fluid, broad-spectrum IV antibiotics Admitted to [facility]  Assessment and plan: Acute colitis Presented with progressive abdominal pain, weakness Afebrile, WBC count normal CT abdomen showed wall thickening, inflammatory stranding in rectum as well as ascending colon.  No prior history of inflammatory bowel disease. Sepsis ruled out given  absence of fever or leukocytes Currently on IV Rocephin and Flagyl.  Continue same for now On clear liquid diet.  Not having any nausea or vomiting.  Advance to soft diet Recent Labs  Lab 11/14/23 1725 11/15/23 0412  WBC 6.9 6.1   Fecal impaction CT abdomen also showed dilated stool-filled rectum measuring 8 cm Daughter stated that she hasn't had a BM in several days. Rectal exam in the ED reportedly showed normal stool without blood Bowel regimen with a scheduled Senokot and MiraLAX  twice daily for today.  Change it to as needed once bowel movement is successful  Foul-smelling urine Urinalysis showed clear yellow urine negative leukocytes, negative nitrite -not suggestive of infection. Continue Ditropan  nightly  CKD 4 Recently hospitalized for AKI and discharged with a creatinine of 2.  Creatinine remains at baseline. Continue to monitor  Recent Labs    09/04/23 0626 09/05/23 0433 09/06/23 0411 10/27/23 1017 10/28/23 1532 10/29/23 0300 10/30/23 0824 10/31/23 0341 11/14/23 1725 11/15/23 0412  BUN 18 17 12 18 20 21 23 21  27* 24*  CREATININE 1.25* 1.30* 1.35* 2.01* 2.14* 1.98* 2.00* 2.02* 2.04* 1.77*  CO2 25 21* 22 26 31 27 27 24 25 25    Atrial fibrillation S/p Watchman device. Continue Cardizem  180 mg daily. Unclear if patient is also on atenolol  50 mg daily.  Pharmacy tech still to review home meds  Hypertension Controlled on Cardizem   HLD Crestor    Hypothyroidism -Resume Synthroid  Recent Labs    05/12/23 1502  TSH 3.280   OSA  Cognitive impairment anxiety/depression PTA meds- Effexor  75 mg twice daily, Aricept  75 mcg daily  GERD chronic anemia Mild anemia.  Was hospitalized in August for GI bleeding.  Creatinine has improved since then.  Hemoglobin remains above 11 PPI  twice daily to continue Recent Labs    09/03/23 0457 09/03/23 1452 10/27/23 1017 10/28/23 1532 10/29/23 0300 10/30/23 0824 10/31/23 0341 11/14/23 1725 11/15/23 0412  HGB  7.1*   < > 12.0   < > 10.4* 11.3* 12.2 12.6 11.1*  MCV 101.9*   < > 92   < > 91.5 92.5 92.3 92.6 92.6  VITAMINB12 243  --   --   --   --   --   --   --   --   FOLATE 9.9  --   --   --   --   --   --   --   --   FERRITIN 60  --  29  --   --   --   --   --   --   TIBC 245*  --  289  --   --   --   --   --   --   IRON 33  --  58  --   --   --   --   --   --    < > = values in this interval not displayed.    Arthritis, chronic back pain Osteoporosis PTA meds- lidocaine  patch, Tylenol  PRN Fosamax  weekly,  Hypothyroidism  Continue Synthroid   Impaired mobility Failure to thrive PT eval ordered.   Home med list has not been obtained/updated by [facility] at this time.  Please double check admission reconciliation.      Mobility: PT eval ordered PT Orders:   PT Follow up Rec: Skilled Nursing-Short Term Rehab (<3 Hours/Day)11/15/2023 1208   Goals of care   Code Status: Limited: Do not attempt resuscitation (DNR) -DNR-LIMITED -Do Not Intubate/DNI      DVT prophylaxis:  enoxaparin (LOVENOX) injection 30 mg Start: 11/15/23 1000   Antimicrobials: IV Rocephin and IV Flagyl Fluid: Currently on NS at 75 mL/h Consultants: None Family Communication: None at bedside  Status: Inpatient Level of care:  Telemetry   Patient is from: Home Needs to continue in-hospital care: Ongoing workup Anticipated d/c to: Pending PT eval      Diet:  Diet Order             DIET SOFT Room service appropriate? Yes; Fluid consistency: Thin  Diet effective now                   Scheduled Meds:  diltiazem   180 mg Oral Daily   donepezil   10 mg Oral QHS   enoxaparin (LOVENOX) injection  30 mg Subcutaneous Q24H   [START ON 11/16/2023] Influenza vac split trivalent PF  0.5 mL Intramuscular Tomorrow-1000   levothyroxine   75 mcg Oral Q0600   pantoprazole   40 mg Oral BID   polyethylene glycol  17 g Oral BID   senna-docusate  1 tablet Oral BID    PRN meds: acetaminophen  **OR**  acetaminophen , ondansetron  **OR** ondansetron  (ZOFRAN ) IV   Infusions:   cefTRIAXone (ROCEPHIN)  IV     metronidazole 500 mg (11/15/23 0825)    Antimicrobials: Anti-infectives (From admission, onward)    Start     Dose/Rate Route Frequency Ordered Stop   11/15/23 2000  cefTRIAXone (ROCEPHIN) 2 g in sodium chloride  0.9 % 100 mL IVPB        2 g 200 mL/hr over 30 Minutes Intravenous Every 24 hours 11/14/23 2315     11/15/23 0800  metroNIDAZOLE (FLAGYL) IVPB 500 mg        500 mg 100 mL/hr  over 60 Minutes Intravenous Every 12 hours 11/14/23 2315     11/14/23 2015  ciprofloxacin (CIPRO) IVPB 400 mg        400 mg 200 mL/hr over 60 Minutes Intravenous  Once 11/14/23 2005 11/14/23 2152   11/14/23 2015  metroNIDAZOLE (FLAGYL) IVPB 500 mg        500 mg 100 mL/hr over 60 Minutes Intravenous  Once 11/14/23 2005 11/14/23 2250       Objective: Vitals:   11/15/23 0542 11/15/23 1249  BP: (!) 130/95 106/77  Pulse: 91 92  Resp: 18 18  Temp: 98.6 F (37 C) 98.2 F (36.8 C)  SpO2: 100% 99%    Intake/Output Summary (Last 24 hours) at 11/15/2023 1613 Last data filed at 11/15/2023 1247 Gross per 24 hour  Intake 1940.35 ml  Output 400 ml  Net 1540.35 ml   Filed Weights   11/14/23 1703 11/14/23 2334  Weight: 69 kg 64.8 kg   Weight change:  Body mass index is 23.77 kg/m.   Physical Exam: General exam: Pleasant, elderly Caucasian female.  Not in physical distress Skin: No rashes, lesions or ulcers. HEENT: Atraumatic, normocephalic, no obvious bleeding Lungs: Clear to auscultation bilaterally,  CVS: S1, S2, no murmur,   GI/Abd: Soft, mild lower abdominal tenderness, nondistended, bowel sound present,   CNS: Alert, awake, oriented to place.  Slow to respond Psychiatry: Sad affect Extremities: No pedal edema, no calf tenderness,   Data Review: I have personally reviewed the laboratory data and studies available.  F/u labs ordered Unresulted Labs (From admission, onward)      Start     Ordered   11/16/23 0500  CBC with Differential/Platelet  Tomorrow morning,   R        11/15/23 1043   11/16/23 0500  Basic metabolic panel with GFR  Tomorrow morning,   R        11/15/23 1043            Signed, Chapman Rota, MD [facility] 11/15/2023

## 2023-11-15 NOTE — Plan of Care (Signed)

## 2023-11-15 NOTE — Consult Note (Signed)
 Palliative Care Consult Note                                  Date: 11/15/2023   Patient Name: [name]  DOB: 01-Mar-1945  MRN: [id number]  Age / Sex: 78 y.o., female  PCP: Terry Wilhelmena Lloyd Hilario, FNP Referring Physician: Arlice Reichert, MD  Reason for Consultation: Establishing goals of care  Past Medical History:  Diagnosis Date  . Anemia   . Anxiety   . Arthritis   . Chronic back pain    Spondylosis and stenosis  . CKD (chronic kidney disease) stage 4, GFR 15-29 ml/min (HCC) 11/14/2023  . Depression   . GERD (gastroesophageal reflux disease)   . Headache(784.0)   . Hiatal hernia   . History of blood transfusion   . History of GI bleed   . Hypothyroidism   . Mitral regurgitation   . Obsessive-compulsive disorder   . Persistent atrial fibrillation (HCC)   . Presence of Watchman left atrial appendage closure device 12/03/2021   31mm Watchman FLX with Dr. Wonda  . Recurrent upper respiratory infection (URI)   . Sleep apnea     Subjective:   This NP Camellia Kays reviewed medical records, received report from team, assessed the patient and then meet at the patient's bedside to discuss diagnosis, prognosis, GOC, EOL wishes disposition and options.  Before meeting with the patient/family, I spent time reviewing the chart notes including previous hospitalization discharge summary dated 10/31/2023, ED triage notes from yesterday, ED provider note from yesterday, ED nursing note from yesterday, admission H&P from yesterday, nursing note from today, hospitalist note from today, PT notes from today, TOC note from today, social worker notes from today. I also reviewed vital signs, nursing flowsheets, medication administrations record, labs, and imaging. Labs reviewed include CBC which shows continued normal white blood cell count at 6.1 in the setting of acute colitis and rectal wall thickening reassuring for no active infection.   Hemoglobin is stable though mildly low at 11.1 compared to 12.6 yesterday in the setting of recent GI bleed and persistent/chronic anastomotic ulcer after gastric bypass.  I met with the patient at the bedside, no family is present.  After seeing the patient at the bedside I called her daughter [name] for GOC discussion.   We meet to discuss diagnosis prognosis, GOC, EOL wishes, disposition and options. Concept of Palliative Care was introduced as specialized medical care for people and their families living with serious illness.  If focuses on providing relief from the symptoms and stress of a serious illness.  The goal is to improve quality of life for both the patient and the family. Values and goals of care important to patient and family were attempted to be elicited.  Created space and opportunity for patient  and family to explore thoughts and feelings regarding current medical situation   Natural trajectory and current clinical status were discussed. Questions and concerns addressed. Patient  encouraged to call with questions or concerns.    Patient/Family Understanding of Illness: When saw the patient at bedside, she is oriented to person and place but disoriented to time and situation.  She is unable to tell me why she is in the hospital or anything about her chronic health.  After seeing the patient I called her daughter [name].  She notes that her mother has cognitive issues and she wants to keep her at  home but her daughter is the sole caregiver.  She is daughter questioning whether to hire private care to help out on days that she is working.  She notes her mother's had recent cognitive decline.  We discussed the issue of cognitive decline versus dementia (which is mentioned in her chart).  We reviewed the previous neurology note together which notes concern for moderate to severe dementia, but no formal diagnosis.  However, neurology does note that her cognitive decline is  degenerative in nature.  We discussed the clinical trajectory of acute illness causing rapid acceleration and cognitive decline.  She notes that her mother was able to care for herself prior to admission in August for bleeding ulcer and since then has had a rapid decline.  This fits the picture that we have been discussing.  Life Review: The patient lives with her daughter who is her sole caregiver.  Baseline Status: Prior to August admission she did have some short-term memory loss and cognitive issues, has a history of traumatic brain injury, but was living with her daughter and able to bathe herself, eat, drink.  Since her hospitalization in August she has had a significant decline and requires assistance with bathing, ADLs.  In the past week she has not wanted to get out of bed and has had ongoing decline in oral intake.  Today's Discussion: In addition to discussions described above we had extensive discussion on various topics.  We discussed her progressive decline over the past roughly 6 months.  We discussed multiple readmissions in the past 6 months.  Prior to admission in August she was able to mostly care for herself with some assistance.  However, since her bleeding ulcer admission she has become more dependent and had an overall decline in health and function.  She was admitted a few weeks ago with AKI and hypokalemia secondary to poor oral intake.  Her daughter agrees that her eating and drinking has been quite poor lately and gotten worse.  We took a step back to discuss her acute care illness and the fact that everything she is here for is theoretically treatable.  However, we also looked at the bigger picture of her onward decline, progressive cognitive decline, and repeat recent admissions.  We looked at what the patient would want and how she would want to proceed with care forward.  The patient's daughter states that she does not think that repeated readmissions, continued aggressive  care that has not been helpful as of late would be what her mother would want.  Given that we discussed comfort care and hospice care as an option. I described hospice as a service for patients who have a life expectancy of 6 months or less. The goal of hospice is the preservation of dignity and quality at the end phases of life. Under hospice care, the focus changes from curative to symptom relief. I explained the three setting where hospice services can be provided including the home, at a living facility (such as LTC SNF, Assisted Living, etc), and a hospice facility. I explained that acceptance to hospice in any specific location is the final decision of the hospice medical director and bed availability, if applicable. They verbalized understanding.  The patient's daughter states that everything we are talking about is stuff that she has been thinking about but has not been able to say out loud.  She agrees that her mother is overall and a continued decline both functionally and mentally.  She is not wanting to  eat and drink and she feels that she will likely continue to decline towards end-of-life.  I shared that I feel she is hospice appropriate and made the recommendation for changing her CODE STATUS to DNR/DNI and engage with hospice.  Her daughter is in agreement.  I shared that I would reach out to [facility] for referral to hospice and palliative medicine will continue to follow while she is in the hospital.  I provided our contact information for any questions or concerns while she is admitted.  I provided emotional and general support through therapeutic listening, empathy, sharing of stories, and other techniques. I answered all questions and addressed all concerns to the best of my ability.  Goals: Changed to DNR/DNI, continued treatment of acute illness, plan for home with hospice at discharge.  Review of Systems  Respiratory:  Negative for shortness of breath.   Gastrointestinal:  Positive for  abdominal pain. Negative for nausea and vomiting.    Objective:   Primary Diagnoses: Present on Admission: . Colitis . Essential hypertension . Hypothyroidism . Moderate cognitive impairment . Persistent atrial fibrillation (HCC) . Presence of Watchman left atrial appendage closure device . CKD (chronic kidney disease) stage 4, GFR 15-29 ml/min (HCC)   Vital Signs:  BP 106/77   Pulse 92   Temp 98.2 F (36.8 C) (Oral)   Resp 18   Ht 5' 5 (1.651 m)   Wt 64.8 kg   SpO2 99%   BMI 23.77 kg/m   Physical Exam Vitals and nursing note reviewed.  Constitutional:      General: She is sleeping. She is not in acute distress. HENT:     Head: Normocephalic and atraumatic.  Cardiovascular:     Rate and Rhythm: Normal rate.  Pulmonary:     Effort: Pulmonary effort is normal. No respiratory distress.     Breath sounds: No wheezing or rhonchi.  Abdominal:     General: Abdomen is flat. Bowel sounds are normal. There is no distension.     Palpations: Abdomen is soft.     Tenderness: There is abdominal tenderness.  Skin:    General: Skin is warm and dry.  Neurological:     Mental Status: She is easily aroused. She is disoriented.     Comments: Oriented to person and place; disoriented to time and situation  Psychiatric:        Mood and Affect: Mood normal.        Behavior: Behavior normal.     Palliative Assessment/Data: 20-30%   Advanced Care Planning:   Existing Vynca/ACP Documentation: None  Primary Decision Maker: NEXT OF KIN  Pertinent diagnosis: Acute colitis, CKD 4, failure to thrive, general decline, A-fib, moderate to severe cognitive impairment (no formal diagnosis of dementia)  The patient and/or family consented to a voluntary Advance Care Planning Conversation over the phone. Individuals present for the conversation: [name] [name]; Camellia Kays, NP; (patient unable to participate in meaningful discussion)  Summary of the conversation: We  discussed overall chronic illness, recent dramatic decline, acute illness, CODE STATUS, options moving forward from full and aggressive care through comfort focused approach/hospice care and multiple points in between.  Outcome of the conversations and/or documents completed: Changed to DNR/DNI, treat acute illness here, plan for discharge to home hospice  I spent 20 minutes providing separately identifiable ACP services with the patient and/or surrogate decision maker in a voluntary, in-person conversation discussing the patient's wishes and goals as detailed in the above note.  Assessment &  Plan:   HPI/Patient Profile: 78 y.o. female  with past medical history of  A-fib with Watchman device, HTN, prediabetes, OSA, dementia/cognitive impairment, CKD, GERD, chronic anemia, arthritis, chronic back pain, hypothyroidism anxiety/depression.  She was recently hospitalized 10/10 to 10/13 for AKI, failure to thrive. PT recommended SNF but daughter refused and took her home with home health.  She presented to the ED with complaint of progressive weakness, increased somnolence, foul-smelling urine, generalized abdominal pain.  She was admitted on 11/14/2023 with acute colitis, fecal impaction, foul-smelling urine, CKD 4, A-fib, cognitive impairment/dementia, failure to thrive, and others.   Palliative medicine was consulted for GOC conversations.  SUMMARY OF RECOMMENDATIONS   Changed to DNR/DNI (DNR-limited) Treat acute illness, especially constipation/fecal impaction for relief of symptoms TOC consult for referral to [facility] for home hospice at discharge Ongoing supportive patient and family Palliative medicine will continue to follow  Symptom Management:  Per primary team Palliative medicine is available to assist as needed  Code Status: DNR - Limited (DNR/DNI)  Prognosis:  < 6 months  Discharge Planning:  Home with Hospice   Discussed with: Patient's family, medical team, nursing team,  [facility] team    Thank you for allowing us  to participate in the care of [name] PMT will continue to support holistically.  Billing based on MDM: High  Problems Addressed: One or more chronic illnesses with severe exacerbation, progression, or side effects of treatment.  Amount and/or Complexity of Data: Category 1:Review of prior external note(s) from each unique source, Review of the result(s) of each unique test, and Assessment requiring an independent historian(s) and Category 3:Discussion of management or test interpretation with external physician/other qualified health care professional/appropriate source (not separately reported)  Risks: Decision not to resuscitate or to de-escalate care because of poor prognosis  Detailed review of medical records (labs, imaging, vital signs), medically appropriate exam, discussed with treatment team, counseling and education to patient, family, & staff, documenting clinical information, medication management, coordination of care  Signed by: Camellia Kays, NP Palliative Medicine Team  Team Phone # [phone] (Nights/Weekends)  11/15/2023, 2:49 PM

## 2023-11-15 NOTE — Plan of Care (Signed)
  Problem: Acute Rehab PT Goals(only PT should resolve) Goal: Pt Will Go Supine/Side To Sit Outcome: Progressing Flowsheets (Taken 11/15/2023 1217) Pt will go Supine/Side to Sit: with minimal assist Goal: Patient Will Perform Sitting Balance Outcome: Progressing Flowsheets (Taken 11/15/2023 1217) Patient will perform sitting balance: with supervision Goal: Patient Will Transfer Sit To/From Stand Outcome: Progressing Flowsheets (Taken 11/15/2023 1217) Patient will transfer sit to/from stand: with minimal assist Goal: Pt Will Transfer Bed To Chair/Chair To Bed Outcome: Progressing Flowsheets (Taken 11/15/2023 1217) Pt will Transfer Bed to Chair/Chair to Bed: with min assist Goal: Pt Will Ambulate Outcome: Progressing Flowsheets (Taken 11/15/2023 1217) Pt will Ambulate:  10 feet  with least restrictive assistive device  with minimal assist   12:18 PM, 11/15/23 Rosaria Settler, PT, DPT [place] with [facility]

## 2023-11-15 NOTE — Care Management Obs Status (Signed)
 MEDICARE OBSERVATION STATUS NOTIFICATION   Patient Details  Name: [name] MRN: [id number] Date of Birth: 08-15-1945   Medicare Observation Status Notification Given:  Yes    Hoy Kendra Bigness, LCSW 11/15/2023, 1:34 PM

## 2023-11-15 NOTE — TOC Initial Note (Addendum)
 Transition of Care [facility]) - Initial/Assessment Note    Patient Details  Name: [name] MRN: [id number] Date of Birth: 07-25-1945  Transition of Care [facility]) CM/SW Contact:    Kendra DELENA Bigness, LCSW Phone Number: 11/15/2023, 1:05 PM  Clinical Narrative:                 Pt from home w/ daughter and son-in-law. Pt is active with [facility] for HHPT/OT/Aide. Spoke with pt's daughter at pt request to review recommendation for SNF placement. Pt's daughter shares pt recently went to SNF at [facility] a month ago however, pt often declined to participate in PT and daughter feels that she had further decline while at SNF. Pt's daughter shares that since last Thursday pt has refused to get out of bed. Daughter is interested in having GOC and a palliative care consult as she is unsure if pt is giving up or if medical issues are causing pt's decline.  CSW will continue to follow for disposition.   ADDENDUM: Pt/daughter interested in home hospice services. Referral made to [facility] w/ [facility] who will reach out to daughter today to discuss. CSW will continue to follow.  Expected Discharge Plan: Home w Home Health Services Barriers to Discharge: Continued Medical Work up   Patient Goals and CMS Choice Patient states their goals for this hospitalization and ongoing recovery are:: For pt to return home CMS Medicare.gov Compare Post Acute Care list provided to:: Patient Represenative (must comment) (Daughter, crystal) Choice offered to / list presented to : Adult Children      Expected Discharge Plan and Services In-house Referral: Clinical Social Work Discharge Planning Services: NA Post Acute Care Choice: Hospice, Home Health Living arrangements for the past 2 months: Single Family Home                                      Prior Living Arrangements/Services Living arrangements for the past 2 months: Single Family Home Lives with:: Adult Children Patient language and need for interpreter  reviewed:: Yes Do you feel safe going back to the place where you live?: Yes      Need for Family Participation in Patient Care: Yes (Comment) Care giver support system in place?: Yes (comment) Current home services: DME, Home PT, Home OT Criminal Activity/Legal Involvement Pertinent to Current Situation/Hospitalization: No - Comment as needed  Activities of Daily Living   ADL Screening (condition at time of admission) Independently performs ADLs?: No Does the patient have a NEW difficulty with bathing/dressing/toileting/self-feeding that is expected to last >3 days?: No Does the patient have a NEW difficulty with getting in/out of bed, walking, or climbing stairs that is expected to last >3 days?: Yes (Initiates electronic notice to provider for possible PT consult) Does the patient have a NEW difficulty with communication that is expected to last >3 days?: No Is the patient deaf or have difficulty hearing?: No Does the patient have difficulty seeing, even when wearing glasses/contacts?: No Does the patient have difficulty concentrating, remembering, or making decisions?: Yes  Permission Sought/Granted Permission sought to share information with : Facility Medical Sales Representative, Family Supports Permission granted to share information with : Yes, Verbal Permission Granted  Share Information with NAME: Adkins,[name] (Daughter)  [phone]           Emotional Assessment Appearance:: Appears older than stated age Attitude/Demeanor/Rapport: Lethargic Affect (typically observed): Flat Orientation: : Oriented to Self, Oriented to  Place Alcohol / Substance Use: Not Applicable Psych Involvement: No (comment)  Admission diagnosis:  Dehydration [E86.0] Colitis [K52.9] Proctitis [K62.89] Patient Active Problem List   Diagnosis Date Noted   Colitis 11/14/2023   CKD (chronic kidney disease) stage 4, GFR 15-29 ml/min (HCC) 11/14/2023   Dysuria 10/30/2023   Hypokalemia 10/29/2023    Acute renal failure superimposed on stage 3a chronic kidney disease (HCC) 10/28/2023   Hyperlipidemia 10/27/2023   Acute marginal ulcer 09/02/2023   Stage 3a chronic kidney disease (HCC) 08/21/2023   Moderate cognitive impairment 08/18/2023   Personal history of traumatic brain injury 08/18/2023   Major depressive disorder in partial remission 08/18/2023   Prediabetes 05/12/2023   Presence of Watchman left atrial appendage closure device 12/03/2021   Constipation 08/14/2019   Persistent atrial fibrillation (HCC)    Copper  deficiency 06/17/2015   Hiatal hernia    Iron deficiency anemia    Spondylolisthesis of lumbar region 01/01/2013   Palpitations 05/08/2012   Hypothyroidism 03/26/2010   H/O gastric bypass 03/26/2010   B12 deficiency 03/19/2009   DEPRESSION/ANXIETY 03/19/2009   Essential hypertension 03/19/2009   Gastroesophageal reflux disease 03/19/2009   Osteoarthritis 03/19/2009   PCP:  Terry Wilhelmena Lloyd Hilario, FNP Pharmacy:   [id number] - [place], [place] - [address] AT [facility] [address] [place] [address] Phone: [phone] Fax: [phone]     Social Drivers of Health (SDOH) Social History: SDOH Screenings   Food Insecurity: No Food Insecurity (11/14/2023)  Housing: Low Risk  (11/14/2023)  Transportation Needs: No Transportation Needs (11/14/2023)  Utilities: Not At Risk (11/14/2023)  Alcohol Screen: Low Risk  (12/15/2022)  Depression (PHQ2-9): High Risk (10/27/2023)  Financial Resource Strain: Low Risk  (12/15/2022)  Physical Activity: Inactive (12/15/2022)  Social Connections: Moderately Isolated (11/14/2023)  Stress: No Stress Concern Present (12/15/2022)  Tobacco Use: Medium Risk (11/15/2023)  Health Literacy: Adequate Health Literacy (12/15/2022)   SDOH Interventions:     Readmission Risk Interventions    11/15/2023    1:00 PM 09/06/2023   12:41 PM 09/05/2023    2:36 PM  Readmission Risk Prevention Plan   Transportation Screening Complete Complete Complete  PCP or Specialist Appt within 5-7 Days Complete    Home Care Screening Complete    Medication Review (RN CM) Complete    HRI or Home Care Consult  Complete Complete  Social Work Consult for Recovery Care Planning/Counseling  Complete Complete  Palliative Care Screening  Not Applicable Not Applicable  Medication Review Oceanographer)  Complete Complete

## 2023-11-16 DIAGNOSIS — Z7189 Other specified counseling: Secondary | ICD-10-CM | POA: Diagnosis not present

## 2023-11-16 DIAGNOSIS — Z515 Encounter for palliative care: Secondary | ICD-10-CM | POA: Diagnosis not present

## 2023-11-16 DIAGNOSIS — K529 Noninfective gastroenteritis and colitis, unspecified: Secondary | ICD-10-CM | POA: Diagnosis not present

## 2023-11-16 DIAGNOSIS — K6289 Other specified diseases of anus and rectum: Secondary | ICD-10-CM | POA: Diagnosis not present

## 2023-11-16 LAB — CBC WITH DIFFERENTIAL/PLATELET
Abs Immature Granulocytes: 0.02 K/uL (ref 0.00–0.07)
Basophils Absolute: 0.1 K/uL (ref 0.0–0.1)
Basophils Relative: 1 %
Eosinophils Absolute: 0.1 K/uL (ref 0.0–0.5)
Eosinophils Relative: 1 %
HCT: 34 % — ABNORMAL LOW (ref 36.0–46.0)
Hemoglobin: 10.8 g/dL — ABNORMAL LOW (ref 12.0–15.0)
Immature Granulocytes: 0 %
Lymphocytes Relative: 34 %
Lymphs Abs: 2 K/uL (ref 0.7–4.0)
MCH: 29.3 pg (ref 26.0–34.0)
MCHC: 31.8 g/dL (ref 30.0–36.0)
MCV: 92.4 fL (ref 80.0–100.0)
Monocytes Absolute: 0.5 K/uL (ref 0.1–1.0)
Monocytes Relative: 9 %
Neutro Abs: 3.3 K/uL (ref 1.7–7.7)
Neutrophils Relative %: 55 %
Platelets: 110 K/uL — ABNORMAL LOW (ref 150–400)
RBC: 3.68 MIL/uL — ABNORMAL LOW (ref 3.87–5.11)
RDW: 17.2 % — ABNORMAL HIGH (ref 11.5–15.5)
WBC: 6 K/uL (ref 4.0–10.5)
nRBC: 0 % (ref 0.0–0.2)

## 2023-11-16 LAB — BASIC METABOLIC PANEL WITH GFR
Anion gap: 10 (ref 5–15)
BUN: 24 mg/dL — ABNORMAL HIGH (ref 8–23)
CO2: 23 mmol/L (ref 22–32)
Calcium: 8.8 mg/dL — ABNORMAL LOW (ref 8.9–10.3)
Chloride: 108 mmol/L (ref 98–111)
Creatinine, Ser: 2.07 mg/dL — ABNORMAL HIGH (ref 0.44–1.00)
GFR, Estimated: 24 mL/min — ABNORMAL LOW (ref >60)
Glucose, Bld: 93 mg/dL (ref 70–99)
Potassium: 4.2 mmol/L (ref 3.5–5.1)
Sodium: 141 mmol/L (ref 135–145)

## 2023-11-16 MED ORDER — METRONIDAZOLE 500 MG PO TABS: 500.0000 mg | ORAL_TABLET | Freq: Three times a day (TID) | ORAL | 0 refills | Status: DC

## 2023-11-16 MED ORDER — SERTRALINE HCL 25 MG PO TABS
25.0000 mg | ORAL_TABLET | Freq: Every day | ORAL | Status: DC
  Administered 2023-11-16 – 2023-11-17 (×2): 25 mg via ORAL
  Filled 2023-11-16 (×2): qty 1

## 2023-11-16 MED ORDER — CEFUROXIME AXETIL 500 MG PO TABS: 500.0000 mg | ORAL_TABLET | Freq: Two times a day (BID) | ORAL | 0 refills | Status: DC

## 2023-11-16 MED ORDER — POLYETHYLENE GLYCOL 3350 17 G PO PACK
17.0000 g | PACK | Freq: Two times a day (BID) | ORAL | Status: DC
  Administered 2023-11-16 – 2023-11-17 (×3): 17 g via ORAL
  Filled 2023-11-16 (×3): qty 1

## 2023-11-16 MED ORDER — CEFUROXIME AXETIL 500 MG PO TABS: 500.0000 mg | ORAL_TABLET | Freq: Every day | ORAL | 0 refills | Status: DC

## 2023-11-16 MED ORDER — ATENOLOL 25 MG PO TABS
50.0000 mg | ORAL_TABLET | Freq: Every day | ORAL | Status: DC
Start: 2023-11-16 — End: 2023-11-18
  Administered 2023-11-16 – 2023-11-17 (×2): 50 mg via ORAL
  Filled 2023-11-16 (×2): qty 2

## 2023-11-16 MED ORDER — BISACODYL 10 MG RE SUPP: 10.0000 mg | Freq: Every day | RECTAL | Status: DC | PRN

## 2023-11-16 MED ORDER — SENNOSIDES-DOCUSATE SODIUM 8.6-50 MG PO TABS
1.0000 | ORAL_TABLET | Freq: Two times a day (BID) | ORAL | Status: DC
  Administered 2023-11-16 – 2023-11-17 (×3): 1 via ORAL
  Filled 2023-11-16 (×3): qty 1

## 2023-11-16 MED ORDER — OXYBUTYNIN CHLORIDE ER 5 MG PO TB24
15.0000 mg | ORAL_TABLET | Freq: Every day | ORAL | Status: DC
  Administered 2023-11-16: 15 mg via ORAL
  Filled 2023-11-16: qty 3

## 2023-11-16 MED ORDER — POLYETHYLENE GLYCOL 3350 17 G PO PACK: 17.0000 g | PACK | Freq: Two times a day (BID) | ORAL | 0 refills | Status: DC

## 2023-11-16 MED ORDER — VENLAFAXINE HCL 37.5 MG PO TABS
75.0000 mg | ORAL_TABLET | Freq: Two times a day (BID) | ORAL | Status: DC
  Administered 2023-11-16 – 2023-11-17 (×2): 75 mg via ORAL
  Filled 2023-11-16 (×2): qty 2

## 2023-11-16 NOTE — Progress Notes (Signed)
 Daily Progress Note   Date: 11/16/2023   Patient Name: [name]  DOB: Apr 22, 1945  MRN: [id number]  Age / Sex: 78 y.o., female  Attending Physician: Mcarthur Pick, MD Primary Care Physician: Terry Wilhelmena Lloyd Hilario, FNP Admit Date: 11/14/2023 Length of Stay: 1 day  Reason for Follow-up: Establishing goals of care  Past Medical History:  Diagnosis Date   Anemia    Anxiety    Arthritis    Chronic back pain    Spondylosis and stenosis   CKD (chronic kidney disease) stage 4, GFR 15-29 ml/min (HCC) 11/14/2023   Depression    GERD (gastroesophageal reflux disease)    Headache(784.0)    Hiatal hernia    History of blood transfusion    History of GI bleed    Hypothyroidism    Mitral regurgitation    Obsessive-compulsive disorder    Persistent atrial fibrillation (HCC)    Presence of Watchman left atrial appendage closure device 12/03/2021   31mm Watchman FLX with Dr. Wonda   Recurrent upper respiratory infection (URI)    Sleep apnea     Subjective:   Subjective: Chart Reviewed. Updates received. Patient Assessed. Created space and opportunity for patient  and family to explore thoughts and feelings regarding current medical situation.  Today's Discussion: Today before meeting with the patient/family, I reviewed the chart notes including nursing note from today, hospitalist note from today. I also reviewed vital signs, nursing flowsheets, medication administrations record, labs, and imaging. Labs reviewed include CBC which shows continued normal white blood cell count at 6.0 in the setting of acute colitis and rectal wall thickening reassuring for no active infection. Hemoglobin is stable though mildly low at 10.8 compared to 11.1 yesterday (overall no significant change) in the setting of recent GI bleed and persistent/chronic anastomotic ulcer after gastric bypass.  Today saw the patient at bedside, no family is present.  She was sleeping but easily awakens to my voice.   She looks weak and frail today.  She does admit abdominal pain, denies other pain, nausea, vomiting.  I was aware that the patient's daughter was working all day today and will be unable to speak on the phone.  However, she was quite concerned yesterday and understandably emotional.  I called and left a reassuring voicemail for her and shared that I would call again tomorrow.  Review of Systems  Respiratory:  Negative for shortness of breath.   Cardiovascular:  Negative for chest pain.  Gastrointestinal:  Positive for abdominal pain. Negative for nausea and vomiting.    Objective:   Primary Diagnoses: Present on Admission:  Colitis  Essential hypertension  Hypothyroidism  Moderate cognitive impairment  Persistent atrial fibrillation (HCC)  Presence of Watchman left atrial appendage closure device  CKD (chronic kidney disease) stage 4, GFR 15-29 ml/min (HCC)   Vital Signs:  BP 100/69 (BP Location: Right Arm)   Pulse 88   Temp 99.6 F (37.6 C) (Oral)   Resp 18   Ht 5' 5 (1.651 m)   Wt 64.8 kg   SpO2 97%   BMI 23.77 kg/m   Physical Exam Vitals and nursing note reviewed.  Constitutional:      General: She is sleeping. She is not in acute distress.    Comments: Appears frail and weak  HENT:     Head: Normocephalic and atraumatic.  Cardiovascular:     Rate and Rhythm: Normal rate.  Pulmonary:     Effort: Pulmonary effort is normal. No respiratory distress.  Abdominal:     General: Abdomen is flat. Bowel sounds are normal.     Palpations: Abdomen is soft.     Tenderness: There is abdominal tenderness.  Skin:    General: Skin is warm and dry.  Neurological:     Mental Status: She is easily aroused. She is disoriented.  Psychiatric:        Mood and Affect: Mood normal.        Behavior: Behavior normal.     Palliative Assessment/Data: 30%   Existing Vynca/ACP Documentation: None  Assessment & Plan:   HPI/Patient Profile:  78 y.o. female  with past medical  history of  A-fib with Watchman device, HTN, prediabetes, OSA, dementia/cognitive impairment, CKD, GERD, chronic anemia, arthritis, chronic back pain, hypothyroidism anxiety/depression.  She was recently hospitalized 10/10 to 10/13 for AKI, failure to thrive. PT recommended SNF but daughter refused and took her home with home health.  She presented to the ED with complaint of progressive weakness, increased somnolence, foul-smelling urine, generalized abdominal pain.  She was admitted on 11/14/2023 with acute colitis, fecal impaction, foul-smelling urine, CKD 4, A-fib, cognitive impairment/dementia, failure to thrive, and others.    Palliative medicine was consulted for GOC conversations.  SUMMARY OF RECOMMENDATIONS   DNR-Limited (DNR/DNI) Continue current scope of care to treat acute illness Anticipate discharge home with hospice services when medically stable Ongoing supportive patient and family Palliative medicine continue to follow  Symptom Management:  Per primary team Palliative medicine is available to assist as needed  Code Status: DNR - Limited (DNR/DNI)  Prognosis: < 6 months  Discharge Planning: Home with Hospice  Discussed with: Medical team, nursing team; left voicemail for daughter  Thank you for allowing us  to participate in the care of [name] PMT will continue to support holistically.  Billing based on MDM: Moderate  Detailed review of medical records (labs, imaging, vital signs), medically appropriate exam, discussed with treatment team, counseling and education to patient, family, & staff, documenting clinical information, medication management, coordination of care  Camellia Kays, NP Palliative Medicine Team  Team Phone # [phone] (Nights/Weekends)  09/16/2020, 8:17 AM

## 2023-11-16 NOTE — Progress Notes (Signed)
 PT Cancellation Note  Patient Details Name: [name] MRN: [id number] DOB: September 14, 1945   Cancelled Treatment:    Reason Eval/Treat Not Completed: Other (comment) SW confirms patient's family has decided to set up home hospice/comfort measure care. PT will sign off at this time.    2:06 PM, 11/16/23 Rosaria Settler, PT, DPT [place] with [facility]

## 2023-11-16 NOTE — Plan of Care (Signed)
   Problem: Elimination: Goal: Will not experience complications related to urinary retention Outcome: Progressing   Problem: Pain Managment: Goal: General experience of comfort will improve and/or be controlled Outcome: Progressing   Problem: Skin Integrity: Goal: Risk for impaired skin integrity will decrease Outcome: Progressing

## 2023-11-16 NOTE — Plan of Care (Signed)

## 2023-11-16 NOTE — TOC Progression Note (Addendum)
 Transition of Care [facility]) - Progression Note    Patient Details  Name: [name] MRN: [id number] Date of Birth: 03-Jun-1945  Transition of Care [facility]) CM/SW Contact  Hoy DELENA Bigness, LCSW Phone Number: 11/16/2023, 11:15 AM  Clinical Narrative:    Spoke with pt's SIL as pt's daughter is unavailable today due to work. [facility] accepted pt for home hospice services. Pt to have a hospital bed delivered to home prior to her returning. Currently awaiting eta for DME delivery.   ADDENDUM: Per [facility], hospital bed to be delivered to pt's home between 2pm and 4pm today.    Expected Discharge Plan: Home w Home Health Services Barriers to Discharge: Continued Medical Work up               Expected Discharge Plan and Services In-house Referral: Clinical Social Work Discharge Planning Services: NA Post Acute Care Choice: Hospice, Home Health Living arrangements for the past 2 months: Single Family Home                                       Social Drivers of Health (SDOH) Interventions SDOH Screenings   Food Insecurity: No Food Insecurity (11/14/2023)  Housing: Low Risk  (11/14/2023)  Transportation Needs: No Transportation Needs (11/14/2023)  Utilities: Not At Risk (11/14/2023)  Alcohol Screen: Low Risk  (12/15/2022)  Depression (PHQ2-9): High Risk (10/27/2023)  Financial Resource Strain: Low Risk  (12/15/2022)  Physical Activity: Inactive (12/15/2022)  Social Connections: Moderately Isolated (11/14/2023)  Stress: No Stress Concern Present (12/15/2022)  Tobacco Use: Medium Risk (11/15/2023)  Health Literacy: Adequate Health Literacy (12/15/2022)    Readmission Risk Interventions    11/15/2023    1:00 PM 09/06/2023   12:41 PM 09/05/2023    2:36 PM  Readmission Risk Prevention Plan  Transportation Screening Complete Complete Complete  PCP or Specialist Appt within 5-7 Days Complete    Home Care Screening Complete    Medication Review (RN CM)  Complete    HRI or Home Care Consult  Complete Complete  Social Work Consult for Recovery Care Planning/Counseling  Complete Complete  Palliative Care Screening  Not Applicable Not Applicable  Medication Review Oceanographer)  Complete Complete

## 2023-11-16 NOTE — Telephone Encounter (Signed)
 Patient admitted 11/14/23 for colitis , will follow

## 2023-11-16 NOTE — Progress Notes (Signed)
 PROGRESS NOTE  [name]  DOB: Aug 08, 1945  PCP: Terry Wilhelmena Lloyd Hilario, FNP FMW:[id number]  DOA: 11/14/2023  LOS: 1 day  Hospital Day: 3  Subjective: Patient was seen and examined this morning. Pleasant, elderly Caucasian female.  Lying on bed.  Not in distress.  Not on supplemental oxygen.  Slow to respond.  Alert, awake, oriented to place.  I called her daughter on the phone from bedside but did not respond. Chart reviewed Remains afebrile, hemodynamically stable. Repeat labs this morning with WC count normal at 6.1, creatinine 1.77   Brief narrative: [name] is a 78 y.o. female with PMH significant for A-fib with Watchman device, HTN, prediabetes, OSA, cognitive impairment, CKD, GERD, chronic anemia, arthritis, chronic back pain, hypothyroidism anxiety/depression. Most recently hospitalized 10/10 to 10/13 for AKI, failure to thrive.  PT recommended SNF but daughter refused and took her home with home health. 10/27, brought to the ED with complaint of progressive weakness, increased somnolence, foul-smelling urine, generalized abdominal pain.  In the ED, afebrile, hemodynamically stable, breathing on room air Labs with WC count normal, platelet 125, BUN/creatinine 27/2.04. CTAP Wo showed  -rectal wall thickening with surrounding inflammatory stranding and presacral edema, compatible with proctitis/colitis; -Wall thickening of the ascending colon, which may reflect additional colitis;  -Dilated, stool-filled rectum measuring 8 cm, which can be seen with fecal impaction/obstipation;   Rectal exam done in ED, showed normal stool. Given IV fluid, broad-spectrum IV antibiotics Admitted to [facility]  Assessment and plan: Acute colitis Presented with progressive abdominal pain, weakness Afebrile, WBC count normal CT abdomen showed wall thickening, inflammatory stranding in rectum as well as ascending colon.  No prior history of inflammatory bowel disease. Sepsis ruled out given  absence of fever or leukocytes Currently on IV Rocephin and Flagyl.  Continue same for now On clear liquid diet.  Not having any nausea or vomiting.  Advance to soft diet. Patient has not been eating well, no recent bowel movements yet, still having abdominal discomfort.  Recent Labs  Lab 11/14/23 1725 11/15/23 0412 11/16/23 0418  WBC 6.9 6.1 6.0   Fecal impaction CT abdomen also showed dilated stool-filled rectum measuring 8 cm Daughter stated that she hasn't had a BM in several days. Rectal exam in the ED reportedly showed normal stool without blood No recent bowel movements yet, will schedule MiraLAX , Senokot today again.  Will give Dulcolax suppository.  Foul-smelling urine Urinalysis showed clear yellow urine negative leukocytes, negative nitrite -not suggestive of infection. Continue Ditropan  nightly  CKD 4 Recently hospitalized for AKI and discharged with a creatinine of 2.  Creatinine remains at baseline. Continue to monitor  Recent Labs    09/05/23 0433 09/06/23 0411 10/27/23 1017 10/28/23 1532 10/29/23 0300 10/30/23 0824 10/31/23 0341 11/14/23 1725 11/15/23 0412 11/16/23 0418  BUN 17 12 18 20 21 23 21  27* 24* 24*  CREATININE 1.30* 1.35* 2.01* 2.14* 1.98* 2.00* 2.02* 2.04* 1.77* 2.07*  CO2 21* 22 26 31 27 27 24 25 25 23    Atrial fibrillation S/p Watchman device. Continue Cardizem  180 mg daily. Home medications resumed.  Hypertension Controlled on Cardizem   HLD Crestor    Hypothyroidism -Resume Synthroid  Recent Labs    05/12/23 1502  TSH 3.280   OSA  Cognitive impairment anxiety/depression PTA meds- Effexor  75 mg twice daily, Aricept  75 mcg daily  GERD chronic anemia Mild anemia.  Was hospitalized in August for GI bleeding.  Creatinine has improved since then.  Hemoglobin remains above 11 PPI twice daily to  continue Recent Labs    09/03/23 0457 09/03/23 1452 10/27/23 1017 10/28/23 1532 10/30/23 0824 10/31/23 0341 11/14/23 1725  11/15/23 0412 11/16/23 0418  HGB 7.1*   < > 12.0   < > 11.3* 12.2 12.6 11.1* 10.8*  MCV 101.9*   < > 92   < > 92.5 92.3 92.6 92.6 92.4  VITAMINB12 243  --   --   --   --   --   --   --   --   FOLATE 9.9  --   --   --   --   --   --   --   --   FERRITIN 60  --  29  --   --   --   --   --   --   TIBC 245*  --  289  --   --   --   --   --   --   IRON 33  --  58  --   --   --   --   --   --    < > = values in this interval not displayed.    Arthritis, chronic back pain Osteoporosis PTA meds- lidocaine  patch, Tylenol  PRN Fosamax  weekly,  Hypothyroidism  Continue Synthroid   Impaired mobility Failure to thrive PT eval ordered.   Home medication resumed as appropriate.  Mobility: PT eval ordered PT Orders:   PT Follow up Rec: Skilled Nursing-Short Term Rehab (<3 Hours/Day)11/15/2023 1208   Goals of care   Code Status: Limited: Do not attempt resuscitation (DNR) -DNR-LIMITED -Do Not Intubate/DNI      DVT prophylaxis:  enoxaparin (LOVENOX) injection 30 mg Start: 11/15/23 1000   Antimicrobials: IV Rocephin and IV Flagyl Fluid: Currently on NS at 75 mL/h Consultants: None Family Communication: None at bedside.  Called patient's daughter.  Voicemail left.  Status: Inpatient Level of care:  Telemetry   Patient is from: Home Needs to continue in-hospital care: Ongoing workup Anticipated d/c to: home with hospice once abdominal pain improving/decent intake and bowel movements    Diet:  Diet Order             DIET SOFT Room service appropriate? Yes; Fluid consistency: Thin  Diet effective now                   Scheduled Meds:  atenolol   50 mg Oral Daily   diltiazem   180 mg Oral Daily   donepezil   10 mg Oral QHS   enoxaparin (LOVENOX) injection  30 mg Subcutaneous Q24H   levothyroxine   75 mcg Oral Q0600   oxybutynin   15 mg Oral QHS   pantoprazole   40 mg Oral BID   polyethylene glycol  17 g Oral BID   senna-docusate  1 tablet Oral BID   sertraline   25 mg  Oral Daily   venlafaxine   75 mg Oral BID    PRN meds: acetaminophen  **OR** acetaminophen , bisacodyl , ondansetron  **OR** ondansetron  (ZOFRAN ) IV   Infusions:   cefTRIAXone (ROCEPHIN)  IV Stopped (11/15/23 2024)   metronidazole 500 mg (11/16/23 0839)    Antimicrobials: Anti-infectives (From admission, onward)    Start     Dose/Rate Route Frequency Ordered Stop   11/16/23 0000  cefUROXime (CEFTIN) 500 MG tablet  Status:  Discontinued        500 mg Oral 2 times daily with meals 11/16/23 1423 11/16/23    11/16/23 0000  metroNIDAZOLE (FLAGYL) 500 MG tablet  500 mg Oral 3 times daily 11/16/23 1423     11/16/23 0000  cefUROXime (CEFTIN) 500 MG tablet        500 mg Oral Daily 11/16/23 1513     11/15/23 2000  cefTRIAXone (ROCEPHIN) 2 g in sodium chloride  0.9 % 100 mL IVPB        2 g 200 mL/hr over 30 Minutes Intravenous Every 24 hours 11/14/23 2315     11/15/23 0800  metroNIDAZOLE (FLAGYL) IVPB 500 mg        500 mg 100 mL/hr over 60 Minutes Intravenous Every 12 hours 11/14/23 2315     11/14/23 2015  ciprofloxacin (CIPRO) IVPB 400 mg        400 mg 200 mL/hr over 60 Minutes Intravenous  Once 11/14/23 2005 11/14/23 2152   11/14/23 2015  metroNIDAZOLE (FLAGYL) IVPB 500 mg        500 mg 100 mL/hr over 60 Minutes Intravenous  Once 11/14/23 2005 11/14/23 2250       Objective: Vitals:   11/16/23 0435 11/16/23 1500  BP: 100/69 107/66  Pulse: 88 72  Resp: 18 17  Temp: 99.6 F (37.6 C) 98.4 F (36.9 C)  SpO2: 97% 97%    Intake/Output Summary (Last 24 hours) at 11/16/2023 1534 Last data filed at 11/16/2023 1300 Gross per 24 hour  Intake 1216.49 ml  Output 400 ml  Net 816.49 ml   Filed Weights   11/14/23 1703 11/14/23 2334  Weight: 69 kg 64.8 kg   Weight change:  Body mass index is 23.77 kg/m.   Physical Exam: General exam: Pleasant, elderly Caucasian female.  Not in physical distress Skin: No rashes, lesions or ulcers. HEENT: Atraumatic, normocephalic, no obvious  bleeding Lungs: Clear to auscultation bilaterally,  CVS: S1, S2, no murmur,   GI/Abd: Soft, mild lower abdominal tenderness, nondistended, bowel sound present,   CNS: Alert, awake, oriented to place.  Slow to respond Psychiatry: Sad affect Extremities: No pedal edema, no calf tenderness,   Data Review: I have personally reviewed the laboratory data and studies available.  F/u labs ordered Unresulted Labs (From admission, onward)    None       Signed, Derryl Duval, MD [facility] 11/16/2023

## 2023-11-16 NOTE — Plan of Care (Signed)
   Problem: Activity: Goal: Risk for activity intolerance will decrease Outcome: Progressing   Problem: Coping: Goal: Level of anxiety will decrease Outcome: Progressing

## 2023-11-17 DIAGNOSIS — K219 Gastro-esophageal reflux disease without esophagitis: Secondary | ICD-10-CM | POA: Diagnosis present

## 2023-11-17 DIAGNOSIS — Z66 Do not resuscitate: Secondary | ICD-10-CM | POA: Diagnosis present

## 2023-11-17 DIAGNOSIS — K6289 Other specified diseases of anus and rectum: Secondary | ICD-10-CM

## 2023-11-17 DIAGNOSIS — G8929 Other chronic pain: Secondary | ICD-10-CM | POA: Diagnosis present

## 2023-11-17 DIAGNOSIS — Z7189 Other specified counseling: Secondary | ICD-10-CM | POA: Diagnosis not present

## 2023-11-17 DIAGNOSIS — F32A Depression, unspecified: Secondary | ICD-10-CM | POA: Diagnosis present

## 2023-11-17 DIAGNOSIS — R4189 Other symptoms and signs involving cognitive functions and awareness: Secondary | ICD-10-CM | POA: Diagnosis present

## 2023-11-17 DIAGNOSIS — N184 Chronic kidney disease, stage 4 (severe): Secondary | ICD-10-CM | POA: Diagnosis present

## 2023-11-17 DIAGNOSIS — Z515 Encounter for palliative care: Secondary | ICD-10-CM | POA: Diagnosis not present

## 2023-11-17 DIAGNOSIS — Z95818 Presence of other cardiac implants and grafts: Secondary | ICD-10-CM

## 2023-11-17 DIAGNOSIS — Z23 Encounter for immunization: Secondary | ICD-10-CM | POA: Diagnosis present

## 2023-11-17 DIAGNOSIS — M810 Age-related osteoporosis without current pathological fracture: Secondary | ICD-10-CM | POA: Diagnosis present

## 2023-11-17 DIAGNOSIS — R627 Adult failure to thrive: Secondary | ICD-10-CM | POA: Diagnosis present

## 2023-11-17 DIAGNOSIS — E785 Hyperlipidemia, unspecified: Secondary | ICD-10-CM | POA: Diagnosis present

## 2023-11-17 DIAGNOSIS — F429 Obsessive-compulsive disorder, unspecified: Secondary | ICD-10-CM | POA: Diagnosis present

## 2023-11-17 DIAGNOSIS — E039 Hypothyroidism, unspecified: Secondary | ICD-10-CM | POA: Diagnosis present

## 2023-11-17 DIAGNOSIS — I4819 Other persistent atrial fibrillation: Secondary | ICD-10-CM | POA: Diagnosis present

## 2023-11-17 DIAGNOSIS — Z7983 Long term (current) use of bisphosphonates: Secondary | ICD-10-CM | POA: Diagnosis not present

## 2023-11-17 DIAGNOSIS — I340 Nonrheumatic mitral (valve) insufficiency: Secondary | ICD-10-CM | POA: Diagnosis present

## 2023-11-17 DIAGNOSIS — F0394 Unspecified dementia, unspecified severity, with anxiety: Secondary | ICD-10-CM | POA: Diagnosis present

## 2023-11-17 DIAGNOSIS — D631 Anemia in chronic kidney disease: Secondary | ICD-10-CM | POA: Diagnosis present

## 2023-11-17 DIAGNOSIS — Z7409 Other reduced mobility: Secondary | ICD-10-CM | POA: Diagnosis present

## 2023-11-17 DIAGNOSIS — K529 Noninfective gastroenteritis and colitis, unspecified: Secondary | ICD-10-CM | POA: Diagnosis present

## 2023-11-17 DIAGNOSIS — E860 Dehydration: Secondary | ICD-10-CM | POA: Diagnosis present

## 2023-11-17 DIAGNOSIS — I129 Hypertensive chronic kidney disease with stage 1 through stage 4 chronic kidney disease, or unspecified chronic kidney disease: Secondary | ICD-10-CM | POA: Diagnosis present

## 2023-11-17 DIAGNOSIS — G4733 Obstructive sleep apnea (adult) (pediatric): Secondary | ICD-10-CM | POA: Diagnosis present

## 2023-11-17 DIAGNOSIS — Z789 Other specified health status: Secondary | ICD-10-CM

## 2023-11-17 DIAGNOSIS — K5641 Fecal impaction: Secondary | ICD-10-CM | POA: Diagnosis present

## 2023-11-17 MED ORDER — SENNOSIDES-DOCUSATE SODIUM 8.6-50 MG PO TABS: 1.0000 | ORAL_TABLET | Freq: Two times a day (BID) | ORAL | 0 refills | Status: DC | PRN

## 2023-11-17 MED ORDER — OXYCODONE HCL 5 MG PO TABS: 5.0000 mg | ORAL_TABLET | ORAL | 0 refills | Status: DC | PRN

## 2023-11-17 MED ORDER — PROCHLORPERAZINE MALEATE 10 MG PO TABS: 10.0000 mg | ORAL_TABLET | ORAL | 0 refills | Status: DC | PRN

## 2023-11-17 MED ORDER — FLEET ENEMA RE ENEM
1.0000 | ENEMA | Freq: Once | RECTAL | Status: AC
  Administered 2023-11-17: 1 via RECTAL

## 2023-11-17 NOTE — Discharge Summary (Addendum)
 Physician Discharge Summary  [name] FMW:[id number] DOB: November 05, 1945 DOA: 11/14/2023  PCP: Kendra Wilhelmena Lloyd Hilario, FNP  Admit date: 11/14/2023 Discharge date: 11/17/2023  Admitted From: Home Disposition: Home with hospice  Recommendations for Outpatient Follow-up:  Follow-up with hospice  Home Health: home hospice Equipment/Devices: None  Discharge Condition: Stable CODE STATUS: DNR Diet recommendation: regular  Brief/Interim Summary:  [name] is a 78 y.o. female with PMH significant for A-fib with Watchman device, HTN, prediabetes, OSA, cognitive impairment, CKD, GERD, chronic anemia, arthritis, chronic back pain, hypothyroidism anxiety/depression. Most recently hospitalized 10/10 to 10/13 for AKI, failure to thrive.  PT recommended SNF but daughter refused and took her home with home health. 10/27, brought to the ED with complaint of progressive weakness, increased somnolence, foul-smelling urine, generalized abdominal pain.   In the ED, afebrile, hemodynamically stable, breathing on room air Labs with WC count normal, platelet 125, BUN/creatinine 27/2.04. CTAP Wo showed  -rectal wall thickening with surrounding inflammatory stranding and presacral edema, compatible with proctitis/colitis; -Wall thickening of the ascending colon, which may reflect additional colitis;  -Dilated, stool-filled rectum measuring 8 cm, which can be seen with fecal impaction/obstipation;    Rectal exam done in ED, showed soft stool. Given IV fluid, broad-spectrum IV antibiotics Admitted to hospital for colitis/proctitis, constipation  Patient received scheduled MiraLAX , Senokot and with no significant bowel movement.  She was given enema on the day of discharge with good response.   She received IV ceftriaxone and Flagyl for colitis, transition to Ceftin and Flagyl on discharge. Palliative care was consulted, patient's daughter was interested in hospice--> plan to discharge with home  hospice. Continued on prior to admission medications.  Stable issues: CKD stage IV, creatinine remains stable around 2. A-fib status post watchman, continue Cardizem  180 mg daily, atenolol  Hypertension, stable Hyperlipidemia, Crestor  Cognitive impairment, anxiety/depression Prior to admission meds, Effexor , Aricept  continued GERD, chronic anemia: Hemoglobin remains stable around 11, continue PPI Arthritis, chronic back pain, prior to admission meds, lidocaine  patch, Tylenol  as needed.  Oxycodone  as needed added. Hypothyroidism, continue Synthroid      Discharge Diagnoses:  Principal Problem:   Colitis Active Problems:   Essential hypertension   Hypothyroidism   Persistent atrial fibrillation (HCC)   Presence of Watchman left atrial appendage closure device   Moderate cognitive impairment   CKD (chronic kidney disease) stage 4, GFR 15-29 ml/min (HCC)   Hospice care patient    Discharge Instructions  Discharge Instructions     Call MD for:  persistant nausea and vomiting   Complete by: As directed    Call MD for:  severe uncontrolled pain   Complete by: As directed    Call MD for:  temperature >100.4   Complete by: As directed    Discharge instructions   Complete by: As directed    1.  Please take Senokot as twice daily to ensure regular bowel movements.  2.  Contact hospice for pain/nausea /vomiting and palliative care/hospice issues.   Increase activity slowly   Complete by: As directed       Allergies as of 11/17/2023       Reactions   Benzodiazepines Other (See Comments)   Cloudy thinking, memory loss, withdrawal symptoms when trying to stop without any other medication for detox.    Nsaids Other (See Comments)   Bleeding ulcer    Statins Nausea And Vomiting   Codeine Nausea Only   Escitalopram Oxalate Nausea Only   Sertraline  Hcl Nausea Only   Tramadol Hcl  Other (See Comments)   Daughter doesn't want mother to take due to the fact that the medication  made the daughter have a seizure         Medication List     TAKE these medications    acetaminophen  500 MG tablet Commonly known as: TYLENOL  Take 1,000 mg by mouth every 6 (six) hours as needed for mild pain (pain score 1-3).   alendronate  70 MG tablet Commonly known as: FOSAMAX  Take 1 tablet (70 mg total) by mouth every 7 (seven) days. Take with a full glass of water  on an empty stomach. What changed: when to take this   atenolol  50 MG tablet Commonly known as: TENORMIN  Take 50 mg by mouth daily.   cefUROXime 500 MG tablet Commonly known as: CEFTIN Take 1 tablet (500 mg total) by mouth daily.   cyanocobalamin 1000 MCG/ML injection Commonly known as: VITAMIN B12 Inject 1,000 mcg into the muscle every 30 (thirty) days.   diltiazem  180 MG 24 hr capsule Commonly known as: Cardizem  CD Take 1 capsule (180 mg total) by mouth daily. What changed: when to take this   donepezil  10 MG tablet Commonly known as: ARICEPT  Take 1 tablet (10 mg total) by mouth at bedtime.   levothyroxine  75 MCG tablet Commonly known as: SYNTHROID  Take 75 mcg by mouth daily before breakfast.   lidocaine  5 % Commonly known as: LIDODERM  PLACE 1 PATCH ONTO THE SKIN DAILY. REMOVE & DISCARD PATCH WITHIN 12 HOURS OR AS DIRECTED BY MD   metroNIDAZOLE 500 MG tablet Commonly known as: FLAGYL Take 1 tablet (500 mg total) by mouth 3 (three) times daily.   ondansetron  4 MG tablet Commonly known as: ZOFRAN  Take 1 tablet (4 mg total) by mouth every 8 (eight) hours as needed for nausea or vomiting.   oxybutynin  15 MG 24 hr tablet Commonly known as: DITROPAN  XL Take 1 tablet (15 mg total) by mouth at bedtime.   oxyCODONE  5 MG immediate release tablet Commonly known as: Oxy IR/ROXICODONE  Take 1 tablet (5 mg total) by mouth every 4 (four) hours as needed for severe pain (pain score 7-10). May crush, mix with water  and give sublingually if needed.   pantoprazole  40 MG tablet Commonly known as:  PROTONIX  TAKE 1 TABLET BY MOUTH TWICE A DAY   polyethylene glycol 17 g packet Commonly known as: MIRALAX  / GLYCOLAX  Take 17 g by mouth 2 (two) times daily.   polyethylene glycol-electrolytes 420 g solution Commonly known as: NuLYTELY  Take 4,000 mLs by mouth once.   potassium chloride  SA 20 MEQ tablet Commonly known as: KLOR-CON  M Take 1 tablet (20 mEq total) by mouth daily.   prochlorperazine  10 MG tablet Commonly known as: COMPAZINE  Take 1 tablet (10 mg total) by mouth every 4 (four) hours as needed for nausea or vomiting. May crush, mix with water  and give sublingually.   rosuvastatin  40 MG tablet Commonly known as: CRESTOR  Take 1 tablet (40 mg total) by mouth daily.   senna-docusate 8.6-50 MG tablet Commonly known as: Senokot-S Take 1 tablet by mouth 2 (two) times daily as needed for mild constipation.   sertraline  25 MG tablet Commonly known as: ZOLOFT  Take 25 mg by mouth daily.   sucralfate  1 g tablet Commonly known as: Carafate  Take 1 tablet (1 g total) by mouth 4 (four) times daily.   venlafaxine  75 MG tablet Commonly known as: EFFEXOR  TAKE 1 TABLET BY MOUTH TWICE A DAY   Vitamin D  (Ergocalciferol ) 1.25 MG (50000 UNIT) Caps  capsule Commonly known as: DRISDOL  Take 1 capsule (50,000 Units total) by mouth every Monday.        Follow-up Information     Del Wilhelmena Falter, [name], [place]. Schedule an appointment as soon as possible for a visit in 1 week(s).   Specialty: Family Medicine Contact information: [address] [place] [address] [phone]                Allergies  Allergen Reactions   Benzodiazepines Other (See Comments)    Cloudy thinking, memory loss, withdrawal symptoms when trying to stop without any other medication for detox.    Nsaids Other (See Comments)    Bleeding ulcer    Statins Nausea And Vomiting   Codeine Nausea Only   Escitalopram Oxalate Nausea Only   Sertraline  Hcl Nausea Only   Tramadol Hcl Other (See  Comments)    Daughter doesn't want mother to take due to the fact that the medication made the daughter have a seizure     Consultations:    Procedures/Studies: CT ABDOMEN PELVIS WO CONTRAST Result Date: 11/14/2023 EXAM: CT ABDOMEN AND PELVIS WITHOUT CONTRAST 11/14/2023 06:17:26 PM TECHNIQUE: CT of the abdomen and pelvis was performed without the administration of intravenous contrast. Multiplanar reformatted images are provided for review. Automated exposure control, iterative reconstruction, and/or weight-based adjustment of the mA/kV was utilized to reduce the radiation dose to as low as reasonably achievable. COMPARISON: CT abdomen and pelvis 09/28. CLINICAL HISTORY: Abdominal pain, acute, nonlocalized; lower abd pain, no recent BM. Pt released home few wks ago and per her daughter, she has been weaker ever since and is concerned that she may have a UTI. Not diabetic, and per pt no blood thinners daily. FINDINGS: LOWER CHEST: There is atelectasis or scarring in the left lung base. LIVER: The liver is unremarkable. GALLBLADDER AND BILE DUCTS: Gallbladder surgically absent. No biliary ductal dilatation. SPLEEN: No acute abnormality. PANCREAS: No acute abnormality. ADRENAL GLANDS: No acute abnormality. KIDNEYS, URETERS AND BLADDER: There are punctate nonobstructing bilateral renal calculi, left greater than right. No hydronephrosis. No perinephric or periureteral stranding. Urinary bladder is unremarkable. GI AND BOWEL: Postsurgical changes in the stomach, likely gastric bypass. There is wall thickening of the ascending colon. The appendix is not visualized. The rectum is dilated and stool field measuring 8 cm. There is rectal wall thickening with surrounding inflammatory stranding and presacral edema. There is no bowel obstruction. PERITONEUM AND RETROPERITONEUM: No ascites. No free air. VASCULATURE: Aorta is normal in caliber. There are atherosclerotic calcifications of the aorta and iliac arteries.  LYMPH NODES: No lymphadenopathy. REPRODUCTIVE ORGANS: The uterus is surgically absent. BONES AND SOFT TISSUES: L4-L5 posterior fusion hardware is present. No acute osseous abnormality. No focal soft tissue abnormality. IMPRESSION: 1. Rectal wall thickening with surrounding inflammatory stranding and presacral edema, compatible with proctitis/colitis; correlate clinically. 2. Wall thickening of the ascending colon, which may reflect additional colitis; correlate clinically. 3. Dilated, stool-filled rectum measuring 8 cm, which can be seen with fecal impaction/obstipation; correlate with symptoms and consider decompression if clinically indicated. 4. Bilateral nonobstructing renal calculi. Electronically signed by: Greig Pique MD 11/14/2023 07:07 PM EDT RP Workstation: [id number]      Subjective:   Discharge Exam: Vitals:   11/16/23 2149 11/17/23 0319  BP: 106/66 112/72  Pulse: 70 73  Resp: 18 17  Temp: 99.3 F (37.4 C) 98.8 F (37.1 C)  SpO2: 96% 96%    General: Pt is alert, awake, not in acute distress Cardiovascular:  rate controlled, S1/S2 + Respiratory: bilateral decreased breath sounds at bases Abdominal: Soft, NT, ND, bowel sounds + Extremities: no edema, no cyanosis    The results of significant diagnostics from this hospitalization (including imaging, microbiology, ancillary and laboratory) are listed below for reference.     Microbiology: No results found for this or any previous visit (from the past 240 hours).   Labs: BNP (last 3 results) No results for input(s): BNP in the last 8760 hours. Basic Metabolic Panel: Recent Labs  Lab 11/14/23 1725 11/15/23 0412 11/16/23 0418  NA 140 141 141  K 4.7 4.5 4.2  CL 103 107 108  CO2 25 25 23   GLUCOSE 104* 88 93  BUN 27* 24* 24*  CREATININE 2.04* 1.77* 2.07*  CALCIUM  9.9 8.9 8.8*   Liver Function Tests: Recent Labs  Lab 11/14/23 1725  AST 34  ALT 17  ALKPHOS 51  BILITOT 0.5  PROT 6.3*  ALBUMIN 3.9    Recent Labs  Lab 11/14/23 1725  LIPASE 39   No results for input(s): AMMONIA in the last 168 hours. CBC: Recent Labs  Lab 11/14/23 1725 11/15/23 0412 11/16/23 0418  WBC 6.9 6.1 6.0  NEUTROABS 4.5  --  3.3  HGB 12.6 11.1* 10.8*  HCT 40.3 35.0* 34.0*  MCV 92.6 92.6 92.4  PLT 125* 101* 110*   Cardiac Enzymes: No results for input(s): CKTOTAL, CKMB, CKMBINDEX, TROPONINI in the last 168 hours. BNP: Invalid input(s): POCBNP CBG: No results for input(s): GLUCAP in the last 168 hours. D-Dimer No results for input(s): DDIMER in the last 72 hours. Hgb A1c No results for input(s): HGBA1C in the last 72 hours. Lipid Profile No results for input(s): CHOL, HDL, LDLCALC, TRIG, CHOLHDL, LDLDIRECT in the last 72 hours. Thyroid  function studies No results for input(s): TSH, T4TOTAL, T3FREE, THYROIDAB in the last 72 hours.  Invalid input(s): FREET3 Anemia work up No results for input(s): VITAMINB12, FOLATE, FERRITIN, TIBC, IRON, RETICCTPCT in the last 72 hours. Urinalysis    Component Value Date/Time   COLORURINE YELLOW 11/14/2023 0830   APPEARANCEUR CLEAR 11/14/2023 0830   APPEARANCEUR Cloudy (A) 05/12/2023 1505   LABSPEC 1.012 11/14/2023 0830   PHURINE 6.0 11/14/2023 0830   GLUCOSEU NEGATIVE 11/14/2023 0830   HGBUR NEGATIVE 11/14/2023 0830   BILIRUBINUR NEGATIVE 11/14/2023 0830   BILIRUBINUR Negative 05/12/2023 1505   KETONESUR NEGATIVE 11/14/2023 0830   PROTEINUR NEGATIVE 11/14/2023 0830   UROBILINOGEN 0.2 05/09/2012 1741   NITRITE NEGATIVE 11/14/2023 0830   LEUKOCYTESUR NEGATIVE 11/14/2023 0830   Sepsis Labs Recent Labs  Lab 11/14/23 1725 11/15/23 0412 11/16/23 0418  WBC 6.9 6.1 6.0   Microbiology No results found for this or any previous visit (from the past 240 hours).   Time coordinating discharge: 35 minutes  SIGNED:   Saia Derossett, MD  [facility] 11/17/2023, 1:01 PM

## 2023-11-17 NOTE — Telephone Encounter (Signed)
 Pt currently admitted and this was the hospital note for today

## 2023-11-17 NOTE — TOC Transition Note (Signed)
 Transition of Care [facility]) - Discharge Note   Patient Details  Name: [name] MRN: [id number] Date of Birth: 18-Apr-1945  Transition of Care [facility]) CM/SW Contact:  Sharlyne Stabs, RN Phone Number: 11/17/2023, 3:57 PM   Clinical Narrative:   Patient discharging home with [facility]. Discharge summary sent to [facility]. Jeannie in the office, confirmed DME has been delivered, they just need to discharge home. RN calling family for transport.   Final next level of care: Home w Hospice Care Barriers to Discharge: Barriers Resolved   Patient Goals and CMS Choice Patient states their goals for this hospitalization and ongoing recovery are:: For pt to return home CMS Medicare.gov Compare Post Acute Care list provided to:: Patient Represenative (must comment) (Daughter, crystal) Choice offered to / list presented to : Adult Children      Discharge Placement               Patient and family notified of of transfer: 11/17/23  Discharge Plan and Services Additional resources added to the After Visit Summary for   In-house Referral: Clinical Social Work Discharge Planning Services: NA Post Acute Care Choice: Hospice, Home Health                               Social Drivers of Health (SDOH) Interventions SDOH Screenings   Food Insecurity: No Food Insecurity (11/14/2023)  Housing: Low Risk  (11/14/2023)  Transportation Needs: No Transportation Needs (11/14/2023)  Utilities: Not At Risk (11/14/2023)  Alcohol Screen: Low Risk  (12/15/2022)  Depression (PHQ2-9): High Risk (10/27/2023)  Financial Resource Strain: Low Risk  (12/15/2022)  Physical Activity: Inactive (12/15/2022)  Social Connections: Moderately Isolated (11/14/2023)  Stress: No Stress Concern Present (12/15/2022)  Tobacco Use: Medium Risk (11/15/2023)  Health Literacy: Adequate Health Literacy (12/15/2022)     Readmission Risk Interventions    11/15/2023    1:00 PM 09/06/2023   12:41 PM 09/05/2023     2:36 PM  Readmission Risk Prevention Plan  Transportation Screening Complete Complete Complete  PCP or Specialist Appt within 5-7 Days Complete    Home Care Screening Complete    Medication Review (RN CM) Complete    HRI or Home Care Consult  Complete Complete  Social Work Consult for Recovery Care Planning/Counseling  Complete Complete  Palliative Care Screening  Not Applicable Not Applicable  Medication Review [employer])  Complete Complete

## 2023-11-17 NOTE — Progress Notes (Signed)
 EMS here to transport pt home; daughter called and notified.

## 2023-11-17 NOTE — Progress Notes (Signed)
 Daily Progress Note   Date: 11/17/2023   Patient Name: [name]  DOB: [birth year]  MRN: [id number]  Age / Sex: 78 y.o., female  Attending Physician: Mcarthur Pick, MD Primary Care Physician: Terry Wilhelmena Lloyd Hilario, FNP Admit Date: 11/14/2023 Length of Stay: 1 day  Reason for Follow-up: Establishing goals of care  Past Medical History:  Diagnosis Date   Anemia    Anxiety    Arthritis    Chronic back pain    Spondylosis and stenosis   CKD (chronic kidney disease) stage 4, GFR 15-29 ml/min (HCC) 11/14/2023   Depression    GERD (gastroesophageal reflux disease)    Headache(784.0)    Hiatal hernia    History of blood transfusion    History of GI bleed    Hypothyroidism    Mitral regurgitation    Obsessive-compulsive disorder    Persistent atrial fibrillation (HCC)    Presence of Watchman left atrial appendage closure device 12/03/2021   31mm Watchman FLX with Dr. Wonda   Recurrent upper respiratory infection (URI)    Sleep apnea     Subjective:   Subjective: Chart Reviewed. Updates received. Patient Assessed. Created space and opportunity for patient  and family to explore thoughts and feelings regarding current medical situation.  Today's Discussion: Today before meeting with the patient/family, I reviewed the chart notes including nursing note from yesterday, TOC note from yesterday. PT note from yesterday. I also reviewed vital signs, nursing flowsheets, medication administrations record, labs, and imaging. No labs as of yet today.  Today saw the patient at bedside, no family is present.  She is awake and watching TV, makes eye contact when I entered the room and answers me when I greet her.  She denies pain today, including abdominal pain.  She denies nausea and vomiting.  She is oriented to person and place [facility]) but thinks is 1947 and cannot tell me why she is in the hospital.  This appears to likely be her baseline.  I offered that I would call her  daughter to update her and she agrees.  Today I attempted to call the patient's daughter [name].  I left a voicemail for callback with an update on her mother's condition.  I ensured she had our team phone number to call for any questions or concerns while the patient remains admitted.  Review of Systems  Constitutional:        Denies pain in general  Respiratory:  Negative for shortness of breath.   Cardiovascular:  Negative for chest pain.  Gastrointestinal:  Negative for abdominal pain, nausea and vomiting.    Objective:   Primary Diagnoses: Present on Admission:  Colitis  Essential hypertension  Hypothyroidism  Moderate cognitive impairment  Persistent atrial fibrillation (HCC)  Presence of Watchman left atrial appendage closure device  CKD (chronic kidney disease) stage 4, GFR 15-29 ml/min (HCC)   Vital Signs:  BP 112/72 (BP Location: Right Arm)   Pulse 73   Temp 98.8 F (37.1 C) (Oral)   Resp 17   Ht 5' 5 (1.651 m)   Wt 64.8 kg   SpO2 96%   BMI 23.77 kg/m   Physical Exam Vitals and nursing note reviewed.  Constitutional:      General: She is sleeping. She is not in acute distress.    Comments: Appears frail and weak  HENT:     Head: Normocephalic and atraumatic.  Cardiovascular:     Rate and Rhythm: Normal rate.  Pulmonary:  Effort: Pulmonary effort is normal. No respiratory distress.  Abdominal:     General: Abdomen is flat. Bowel sounds are normal.     Palpations: Abdomen is soft.     Tenderness: There is abdominal tenderness.  Skin:    General: Skin is warm and dry.  Neurological:     Mental Status: She is easily aroused. She is disoriented.  Psychiatric:        Mood and Affect: Mood normal.        Behavior: Behavior normal.     Palliative Assessment/Data: 30%   Existing Vynca/ACP Documentation: None  Assessment & Plan:   HPI/Patient Profile:  78 y.o. female  with past medical history of  A-fib with Watchman device, HTN, prediabetes,  OSA, dementia/cognitive impairment, CKD, GERD, chronic anemia, arthritis, chronic back pain, hypothyroidism anxiety/depression.  She was recently hospitalized 10/10 to 10/13 for AKI, failure to thrive. PT recommended SNF but daughter refused and took her home with home health.  She presented to the ED with complaint of progressive weakness, increased somnolence, foul-smelling urine, generalized abdominal pain.  She was admitted on 11/14/2023 with acute colitis, fecal impaction, foul-smelling urine, CKD 4, A-fib, cognitive impairment/dementia, failure to thrive, and others.    Palliative medicine was consulted for GOC conversations.  SUMMARY OF RECOMMENDATIONS   DNR-Limited (DNR/DNI) Continue current scope of care to treat acute illness Anticipate discharge home with hospice services when medically stable, possibly next 24-48 hours Ongoing supportive patient and family Palliative medicine continue to follow  Symptom Management: Per primary team Palliative medicine is available to assist as needed  Code Status: DNR - Limited (DNR/DNI)  Prognosis: < 6 months  Discharge Planning: Home with Hospice  Discussed with: Medical team, nursing team, left voicemail for daughter  Thank you for allowing us  to participate in the care of [name] PMT will continue to support holistically.  Billing based on MDM: Moderate  Detailed review of medical records (labs, imaging, vital signs), medically appropriate exam, discussed with treatment team, counseling and education to patient, family, & staff, documenting clinical information, medication management, coordination of care  Kendra Kays, NP Palliative Medicine Team  Team Phone # [phone] (Nights/Weekends)  09/16/2020, 8:17 AM

## 2023-11-17 NOTE — Progress Notes (Signed)
 Discharge paper work given to patient in her bag. EMS taking patient home. Nurse stated he verified IV's were removed. Daughter called and informed that patient is leaving at this time. Patient is alert, she is in no distress

## 2023-11-18 ENCOUNTER — Telehealth: Payer: Self-pay

## 2023-11-18 ENCOUNTER — Inpatient Hospital Stay

## 2023-11-18 DIAGNOSIS — N1831 Chronic kidney disease, stage 3a: Secondary | ICD-10-CM | POA: Diagnosis not present

## 2023-11-18 DIAGNOSIS — E039 Hypothyroidism, unspecified: Secondary | ICD-10-CM | POA: Diagnosis not present

## 2023-11-18 DIAGNOSIS — D631 Anemia in chronic kidney disease: Secondary | ICD-10-CM | POA: Diagnosis not present

## 2023-11-18 DIAGNOSIS — N179 Acute kidney failure, unspecified: Secondary | ICD-10-CM | POA: Diagnosis not present

## 2023-11-18 DIAGNOSIS — F32A Depression, unspecified: Secondary | ICD-10-CM

## 2023-11-18 DIAGNOSIS — I4819 Other persistent atrial fibrillation: Secondary | ICD-10-CM | POA: Diagnosis not present

## 2023-11-18 DIAGNOSIS — F0393 Unspecified dementia, unspecified severity, with mood disturbance: Secondary | ICD-10-CM | POA: Diagnosis not present

## 2023-11-18 DIAGNOSIS — E785 Hyperlipidemia, unspecified: Secondary | ICD-10-CM | POA: Diagnosis not present

## 2023-11-18 DIAGNOSIS — I340 Nonrheumatic mitral (valve) insufficiency: Secondary | ICD-10-CM | POA: Diagnosis not present

## 2023-11-18 DIAGNOSIS — F0394 Unspecified dementia, unspecified severity, with anxiety: Secondary | ICD-10-CM

## 2023-11-18 DIAGNOSIS — E876 Hypokalemia: Secondary | ICD-10-CM | POA: Diagnosis not present

## 2023-11-18 DIAGNOSIS — I129 Hypertensive chronic kidney disease with stage 1 through stage 4 chronic kidney disease, or unspecified chronic kidney disease: Secondary | ICD-10-CM | POA: Diagnosis not present

## 2023-11-18 NOTE — Transitions of Care (Post Inpatient/ED Visit) (Signed)
   11/18/2023  Name: [name] MRN: [id number] DOB: 1945/02/16  Today's TOC FU Call Status: Today's TOC FU Call Status:: Successful TOC FU Call Completed TOC FU Call Complete Date: 11/18/23 Patient's Name and Date of Birth confirmed.  Transition Care Management Follow-up Telephone Call Date of Discharge: 11/17/23 [facility] Type of Discharge: Inpatient Admission Primary Inpatient Discharge Diagnosis:: Colitis How have you been since you were released from the hospital?: Same  Placed call to patient and spoke with [name] ( daughter on DPR)  Daughter reports patient is active with [facility].  Reports that he mom is in the bed resting and is comfortable at this time.  Full TOC and assessments not completed.  Alan Ee, RN, BSN, CEN [employer]- Transition of Care Team.  [facility] [phone]

## 2023-11-18 NOTE — Telephone Encounter (Signed)
 Thank you for the update. They can reach out if anything changes. I am sorry to hear that.

## 2023-11-22 ENCOUNTER — Encounter (HOSPITAL_COMMUNITY): Payer: Self-pay | Admitting: Hematology & Oncology

## 2023-11-22 ENCOUNTER — Telehealth: Payer: Self-pay

## 2023-11-22 NOTE — Telephone Encounter (Signed)
 Called to check in with patient, who had LAAO on 12/03/2021. Spoke with the patient's daughter, Camelia, who reported Ms. Swartzlander is not doing very well. She has had several hospitalizations recently and is now not eating. She is now on Hospice as off last week. Gave [name] condolences and well wishes. She will call if she/her mother need anything. She was grateful for call.

## 2023-11-30 ENCOUNTER — Ambulatory Visit: Admitting: Urology

## 2023-12-16 ENCOUNTER — Encounter (HOSPITAL_COMMUNITY): Payer: Self-pay | Admitting: Hematology & Oncology

## 2024-01-02 ENCOUNTER — Other Ambulatory Visit: Payer: Self-pay | Admitting: Family Medicine

## 2024-01-19 DEATH — deceased

## 2024-01-20 ENCOUNTER — Other Ambulatory Visit: Payer: Self-pay | Admitting: Student

## 2024-01-25 ENCOUNTER — Telehealth: Payer: Self-pay | Admitting: Family Medicine

## 2024-01-25 ENCOUNTER — Ambulatory Visit: Admitting: Family Medicine

## 2024-01-25 NOTE — Telephone Encounter (Unsigned)
 Copied from CRM [id number]. Topic: General - Deceased Patient >> 02/03/2024  9:08 AM Ole G wrote: Name of caller: Lenn Corona daughter  Date of death: 01/27/2024   Name of funeral home: [facility]  Phone number of funeral home: unsure  Provider that needs to sign form: unsure  Timeline for signing: unsure

## 2024-01-25 NOTE — Telephone Encounter (Signed)
 The death cert has already been signed by the hospice dr. The funeral home told the daughter they are waiting for it to be registered with the health dept. Nothing for our office to do

## 2024-02-01 ENCOUNTER — Ambulatory Visit: Admitting: Nurse Practitioner

## 2024-02-01 ENCOUNTER — Encounter: Payer: Self-pay | Admitting: Family Medicine

## 2024-02-09 ENCOUNTER — Other Ambulatory Visit: Payer: Self-pay | Admitting: Neurology

## 2024-08-20 ENCOUNTER — Ambulatory Visit: Admitting: Neurology
